# Patient Record
Sex: Male | Born: 1955
Health system: Southern US, Community
[De-identification: ages and names within clinical notes are randomized; demographics above are authoritative.]

## PROBLEM LIST (undated history)

## (undated) DIAGNOSIS — N189 Chronic kidney disease, unspecified: Secondary | ICD-10-CM

## (undated) DIAGNOSIS — L989 Disorder of the skin and subcutaneous tissue, unspecified: Secondary | ICD-10-CM

## (undated) DIAGNOSIS — IMO0001 Reserved for inherently not codable concepts without codable children: Secondary | ICD-10-CM

## (undated) DIAGNOSIS — I428 Other cardiomyopathies: Secondary | ICD-10-CM

## (undated) DIAGNOSIS — I639 Cerebral infarction, unspecified: Secondary | ICD-10-CM

## (undated) DIAGNOSIS — I509 Heart failure, unspecified: Secondary | ICD-10-CM

## (undated) DIAGNOSIS — D649 Anemia, unspecified: Secondary | ICD-10-CM

## (undated) DIAGNOSIS — Z95810 Presence of automatic (implantable) cardiac defibrillator: Secondary | ICD-10-CM

## (undated) DIAGNOSIS — Z8673 Personal history of transient ischemic attack (TIA), and cerebral infarction without residual deficits: Secondary | ICD-10-CM

## (undated) DIAGNOSIS — I739 Peripheral vascular disease, unspecified: Secondary | ICD-10-CM

## (undated) DIAGNOSIS — I679 Cerebrovascular disease, unspecified: Secondary | ICD-10-CM

## (undated) DIAGNOSIS — E785 Hyperlipidemia, unspecified: Secondary | ICD-10-CM

## (undated) DIAGNOSIS — I10 Essential (primary) hypertension: Secondary | ICD-10-CM

## (undated) DIAGNOSIS — M1990 Unspecified osteoarthritis, unspecified site: Secondary | ICD-10-CM

## (undated) DIAGNOSIS — Z720 Tobacco use: Secondary | ICD-10-CM

## (undated) HISTORY — DX: Disorder of the skin and subcutaneous tissue, unspecified: L98.9

## (undated) HISTORY — DX: Cerebrovascular disease, unspecified: I67.9

## (undated) HISTORY — DX: Essential (primary) hypertension: I10

## (undated) HISTORY — PX: LEG AMPUTATION BELOW KNEE: SHX694

## (undated) HISTORY — PX: EYE SURGERY: SHX253

## (undated) HISTORY — DX: Peripheral vascular disease, unspecified: I73.9

## (undated) HISTORY — DX: Chronic kidney disease, unspecified: N18.9

## (undated) HISTORY — DX: Hyperlipidemia, unspecified: E78.5

## (undated) HISTORY — DX: Other cardiomyopathies: I42.8

## (undated) HISTORY — PX: CARDIAC DEFIBRILLATOR PLACEMENT: SHX171

## (undated) HISTORY — DX: Personal history of transient ischemic attack (TIA), and cerebral infarction without residual deficits: Z86.73

## (undated) HISTORY — DX: Anemia, unspecified: D64.9

## (undated) HISTORY — DX: Tobacco use: Z72.0

## (undated) HISTORY — DX: Heart failure, unspecified: I50.9

## (undated) SURGERY — Surgical Case
Anesthesia: *Unknown

---

## 2002-06-24 ENCOUNTER — Inpatient Hospital Stay (HOSPITAL_COMMUNITY): Admission: EM | Admit: 2002-06-24 | Discharge: 2002-06-26 | Payer: Self-pay | Admitting: Emergency Medicine

## 2002-06-24 ENCOUNTER — Encounter: Payer: Self-pay | Admitting: Emergency Medicine

## 2002-06-25 ENCOUNTER — Encounter: Payer: Self-pay | Admitting: *Deleted

## 2002-07-16 ENCOUNTER — Ambulatory Visit (HOSPITAL_COMMUNITY): Admission: RE | Admit: 2002-07-16 | Discharge: 2002-07-16 | Payer: Self-pay | Admitting: *Deleted

## 2002-07-25 ENCOUNTER — Ambulatory Visit (HOSPITAL_COMMUNITY): Admission: RE | Admit: 2002-07-25 | Discharge: 2002-07-25 | Payer: Self-pay | Admitting: *Deleted

## 2002-07-28 ENCOUNTER — Emergency Department (HOSPITAL_COMMUNITY): Admission: EM | Admit: 2002-07-28 | Discharge: 2002-07-28 | Payer: Self-pay | Admitting: Emergency Medicine

## 2002-07-28 ENCOUNTER — Encounter: Payer: Self-pay | Admitting: Emergency Medicine

## 2002-09-03 ENCOUNTER — Encounter: Payer: Self-pay | Admitting: Family Medicine

## 2002-09-03 ENCOUNTER — Ambulatory Visit (HOSPITAL_COMMUNITY): Admission: RE | Admit: 2002-09-03 | Discharge: 2002-09-03 | Payer: Self-pay | Admitting: Family Medicine

## 2003-08-19 ENCOUNTER — Encounter: Payer: Self-pay | Admitting: Internal Medicine

## 2003-11-12 ENCOUNTER — Inpatient Hospital Stay (HOSPITAL_COMMUNITY): Admission: RE | Admit: 2003-11-12 | Discharge: 2003-11-13 | Payer: Self-pay | Admitting: *Deleted

## 2004-02-04 ENCOUNTER — Ambulatory Visit: Payer: Self-pay

## 2004-05-27 ENCOUNTER — Ambulatory Visit: Payer: Self-pay | Admitting: Internal Medicine

## 2004-06-14 ENCOUNTER — Ambulatory Visit: Payer: Self-pay | Admitting: *Deleted

## 2004-09-23 ENCOUNTER — Ambulatory Visit: Payer: Self-pay

## 2005-02-10 ENCOUNTER — Ambulatory Visit: Payer: Self-pay | Admitting: Cardiology

## 2007-02-02 ENCOUNTER — Ambulatory Visit: Payer: Self-pay | Admitting: Internal Medicine

## 2007-02-09 ENCOUNTER — Ambulatory Visit: Payer: Self-pay | Admitting: Cardiovascular Disease

## 2007-05-24 ENCOUNTER — Ambulatory Visit: Payer: Self-pay | Admitting: Cardiovascular Disease

## 2007-07-16 ENCOUNTER — Ambulatory Visit: Payer: Self-pay | Admitting: Cardiovascular Disease

## 2007-09-04 ENCOUNTER — Ambulatory Visit: Payer: Self-pay | Admitting: Internal Medicine

## 2007-09-17 ENCOUNTER — Ambulatory Visit (HOSPITAL_COMMUNITY): Admission: RE | Admit: 2007-09-17 | Discharge: 2007-09-17 | Payer: Self-pay | Admitting: Family Medicine

## 2007-10-29 ENCOUNTER — Encounter (INDEPENDENT_AMBULATORY_CARE_PROVIDER_SITE_OTHER): Payer: Self-pay

## 2007-10-29 LAB — CONVERTED CEMR LAB
ALT: 8 U/L
AST: 9 U/L
Albumin: 4.3 g/dL
Alkaline Phosphatase: 70 U/L
BUN: 34 mg/dL
Bilirubin, Direct: 0.1 mg/dL
CO2: 29 meq/L
Calcium: 9.8 mg/dL
Chloride: 100 meq/L
Cholesterol: 144 mg/dL
Creatinine, Ser: 1.25 mg/dL
Glucose, Bld: 214 mg/dL
HDL: 35 mg/dL
Hgb A1c MFr Bld: 11.5 %
LDL Cholesterol: 40 mg/dL
Potassium: 3.7 meq/L
Sodium: 145 meq/L
Total Protein: 7 g/dL
Triglycerides: 346 mg/dL

## 2007-12-05 ENCOUNTER — Ambulatory Visit (HOSPITAL_COMMUNITY): Admission: RE | Admit: 2007-12-05 | Discharge: 2007-12-05 | Payer: Self-pay | Admitting: Family Medicine

## 2007-12-20 ENCOUNTER — Ambulatory Visit: Payer: Self-pay | Admitting: Cardiology

## 2008-05-22 ENCOUNTER — Encounter: Payer: Self-pay | Admitting: Internal Medicine

## 2008-06-17 ENCOUNTER — Ambulatory Visit: Payer: Self-pay | Admitting: Vascular Surgery

## 2008-06-24 ENCOUNTER — Ambulatory Visit: Payer: Self-pay | Admitting: Surgery

## 2008-06-24 ENCOUNTER — Ambulatory Visit (HOSPITAL_COMMUNITY): Admission: RE | Admit: 2008-06-24 | Discharge: 2008-06-24 | Payer: Self-pay | Admitting: Surgery

## 2008-08-05 ENCOUNTER — Ambulatory Visit: Payer: Self-pay | Admitting: Vascular Surgery

## 2008-08-11 ENCOUNTER — Encounter: Payer: Self-pay | Admitting: Cardiology

## 2008-08-14 ENCOUNTER — Encounter: Payer: Self-pay | Admitting: Cardiovascular Disease

## 2008-08-18 ENCOUNTER — Ambulatory Visit: Payer: Self-pay | Admitting: Vascular Surgery

## 2008-08-19 ENCOUNTER — Ambulatory Visit: Payer: Self-pay | Admitting: Cardiology

## 2008-08-27 DIAGNOSIS — E785 Hyperlipidemia, unspecified: Secondary | ICD-10-CM | POA: Insufficient documentation

## 2008-08-27 DIAGNOSIS — I10 Essential (primary) hypertension: Secondary | ICD-10-CM | POA: Insufficient documentation

## 2008-08-28 ENCOUNTER — Ambulatory Visit: Payer: Self-pay | Admitting: Cardiology

## 2008-08-28 ENCOUNTER — Encounter: Payer: Self-pay | Admitting: Internal Medicine

## 2008-09-03 ENCOUNTER — Ambulatory Visit (HOSPITAL_COMMUNITY): Admission: RE | Admit: 2008-09-03 | Discharge: 2008-09-03 | Payer: Self-pay | Admitting: Vascular Surgery

## 2008-09-03 ENCOUNTER — Encounter: Payer: Self-pay | Admitting: Vascular Surgery

## 2008-09-03 ENCOUNTER — Ambulatory Visit: Payer: Self-pay | Admitting: Vascular Surgery

## 2008-09-16 ENCOUNTER — Ambulatory Visit: Payer: Self-pay | Admitting: Vascular Surgery

## 2008-09-25 ENCOUNTER — Emergency Department (HOSPITAL_COMMUNITY): Admission: EM | Admit: 2008-09-25 | Discharge: 2008-09-26 | Payer: Self-pay | Admitting: Emergency Medicine

## 2008-09-30 ENCOUNTER — Ambulatory Visit: Payer: Self-pay | Admitting: Vascular Surgery

## 2008-10-14 ENCOUNTER — Ambulatory Visit: Payer: Self-pay | Admitting: Vascular Surgery

## 2008-10-30 ENCOUNTER — Encounter: Payer: Self-pay | Admitting: Vascular Surgery

## 2008-10-30 ENCOUNTER — Ambulatory Visit: Payer: Self-pay | Admitting: Vascular Surgery

## 2008-10-30 ENCOUNTER — Inpatient Hospital Stay (HOSPITAL_COMMUNITY): Admission: RE | Admit: 2008-10-30 | Discharge: 2008-11-05 | Payer: Self-pay | Admitting: Vascular Surgery

## 2008-10-31 ENCOUNTER — Ambulatory Visit: Payer: Self-pay | Admitting: Physical Medicine & Rehabilitation

## 2008-11-03 ENCOUNTER — Ambulatory Visit: Payer: Self-pay | Admitting: Vascular Surgery

## 2008-11-05 ENCOUNTER — Inpatient Hospital Stay (HOSPITAL_COMMUNITY)
Admission: RE | Admit: 2008-11-05 | Discharge: 2008-11-14 | Payer: Self-pay | Admitting: Physical Medicine & Rehabilitation

## 2008-11-05 ENCOUNTER — Encounter: Payer: Self-pay | Admitting: Cardiology

## 2008-11-11 ENCOUNTER — Ambulatory Visit: Payer: Self-pay | Admitting: Physical Medicine & Rehabilitation

## 2008-12-02 ENCOUNTER — Ambulatory Visit: Payer: Self-pay | Admitting: Vascular Surgery

## 2008-12-05 ENCOUNTER — Encounter
Admission: RE | Admit: 2008-12-05 | Discharge: 2008-12-23 | Payer: Self-pay | Admitting: Physical Medicine & Rehabilitation

## 2008-12-10 ENCOUNTER — Ambulatory Visit: Payer: Self-pay | Admitting: Physical Medicine & Rehabilitation

## 2008-12-23 ENCOUNTER — Encounter: Payer: Self-pay | Admitting: Cardiology

## 2008-12-23 ENCOUNTER — Ambulatory Visit: Payer: Self-pay | Admitting: Vascular Surgery

## 2009-01-07 ENCOUNTER — Encounter: Payer: Self-pay | Admitting: Cardiology

## 2009-01-07 ENCOUNTER — Ambulatory Visit: Payer: Self-pay | Admitting: Cardiology

## 2009-01-07 ENCOUNTER — Encounter: Payer: Self-pay | Admitting: Adult Health

## 2009-01-07 DIAGNOSIS — I739 Peripheral vascular disease, unspecified: Secondary | ICD-10-CM | POA: Insufficient documentation

## 2009-01-08 ENCOUNTER — Encounter: Payer: Self-pay | Admitting: Cardiology

## 2009-01-12 ENCOUNTER — Ambulatory Visit: Payer: Self-pay | Admitting: Cardiology

## 2009-01-27 ENCOUNTER — Ambulatory Visit: Payer: Self-pay | Admitting: Vascular Surgery

## 2009-01-30 ENCOUNTER — Encounter (INDEPENDENT_AMBULATORY_CARE_PROVIDER_SITE_OTHER): Payer: Self-pay

## 2009-01-30 ENCOUNTER — Ambulatory Visit: Payer: Self-pay | Admitting: Cardiology

## 2009-02-03 ENCOUNTER — Ambulatory Visit: Payer: Self-pay | Admitting: Cardiology

## 2009-02-03 ENCOUNTER — Encounter: Payer: Self-pay | Admitting: Internal Medicine

## 2009-03-13 ENCOUNTER — Encounter: Payer: Self-pay | Admitting: Adult Health

## 2009-04-28 ENCOUNTER — Ambulatory Visit: Payer: Self-pay | Admitting: Vascular Surgery

## 2009-06-10 ENCOUNTER — Encounter (INDEPENDENT_AMBULATORY_CARE_PROVIDER_SITE_OTHER): Payer: Self-pay | Admitting: *Deleted

## 2009-08-10 ENCOUNTER — Encounter (INDEPENDENT_AMBULATORY_CARE_PROVIDER_SITE_OTHER): Payer: Self-pay | Admitting: *Deleted

## 2009-08-10 LAB — CONVERTED CEMR LAB
Albumin: 4 g/dL
Bilirubin, Direct: 0.1 mg/dL
CO2: 32 meq/L
Chloride: 99 meq/L
Cholesterol: 182 mg/dL
Creatinine, Ser: 1.28 mg/dL
Hemoglobin: 14 g/dL
Platelets: 194 10*3/uL
Total Protein: 6.9 g/dL

## 2009-08-18 ENCOUNTER — Ambulatory Visit: Payer: Self-pay | Admitting: Cardiology

## 2009-08-19 ENCOUNTER — Encounter: Payer: Self-pay | Admitting: Adult Health

## 2009-09-01 ENCOUNTER — Encounter (INDEPENDENT_AMBULATORY_CARE_PROVIDER_SITE_OTHER): Payer: Self-pay | Admitting: *Deleted

## 2009-09-07 ENCOUNTER — Encounter (INDEPENDENT_AMBULATORY_CARE_PROVIDER_SITE_OTHER): Payer: Self-pay | Admitting: *Deleted

## 2009-09-07 DIAGNOSIS — G459 Transient cerebral ischemic attack, unspecified: Secondary | ICD-10-CM | POA: Insufficient documentation

## 2009-09-09 ENCOUNTER — Encounter: Payer: Self-pay | Admitting: Orthopedic Surgery

## 2009-09-14 ENCOUNTER — Ambulatory Visit: Payer: Self-pay | Admitting: Orthopedic Surgery

## 2009-09-14 DIAGNOSIS — M722 Plantar fascial fibromatosis: Secondary | ICD-10-CM | POA: Insufficient documentation

## 2009-09-14 DIAGNOSIS — M7660 Achilles tendinitis, unspecified leg: Secondary | ICD-10-CM | POA: Insufficient documentation

## 2009-09-22 ENCOUNTER — Ambulatory Visit: Payer: Self-pay | Admitting: Internal Medicine

## 2009-09-22 ENCOUNTER — Encounter (INDEPENDENT_AMBULATORY_CARE_PROVIDER_SITE_OTHER): Payer: Self-pay | Admitting: Pediatrics

## 2009-10-07 ENCOUNTER — Encounter (INDEPENDENT_AMBULATORY_CARE_PROVIDER_SITE_OTHER): Payer: Self-pay | Admitting: *Deleted

## 2009-10-07 ENCOUNTER — Encounter: Payer: Self-pay | Admitting: Internal Medicine

## 2009-10-12 ENCOUNTER — Encounter: Payer: Self-pay | Admitting: Orthopedic Surgery

## 2009-10-16 ENCOUNTER — Encounter: Payer: Self-pay | Admitting: Internal Medicine

## 2009-10-16 ENCOUNTER — Ambulatory Visit (HOSPITAL_COMMUNITY): Admission: RE | Admit: 2009-10-16 | Discharge: 2009-10-16 | Payer: Self-pay | Admitting: Internal Medicine

## 2009-10-21 LAB — CONVERTED CEMR LAB
Basophils Relative: 0 % (ref 0–1)
CO2: 24 meq/L (ref 19–32)
Calcium: 9.6 mg/dL (ref 8.4–10.5)
Chloride: 102 meq/L (ref 96–112)
Creatinine, Ser: 2.05 mg/dL — ABNORMAL HIGH (ref 0.40–1.50)
Eosinophils Relative: 2 % (ref 0–5)
Glucose, Bld: 202 mg/dL — ABNORMAL HIGH (ref 70–99)
HCT: 39.2 % (ref 39.0–52.0)
Hemoglobin: 12.4 g/dL — ABNORMAL LOW (ref 13.0–17.0)
INR: 1.02 (ref <1.50)
Lymphs Abs: 1.5 10*3/uL (ref 0.7–4.0)
MCV: 90.7 fL (ref 78.0–100.0)
Neutro Abs: 5.3 10*3/uL (ref 1.7–7.7)
Potassium: 4.4 meq/L (ref 3.5–5.3)
Prothrombin Time: 13.3 s (ref 11.6–15.2)
WBC: 7.4 10*3/uL (ref 4.0–10.5)
aPTT: 25 s (ref 24–37)

## 2009-10-23 ENCOUNTER — Ambulatory Visit (HOSPITAL_COMMUNITY): Admission: RE | Admit: 2009-10-23 | Discharge: 2009-10-23 | Payer: Self-pay | Admitting: Cardiology

## 2009-10-23 ENCOUNTER — Ambulatory Visit: Payer: Self-pay | Admitting: Internal Medicine

## 2009-10-27 ENCOUNTER — Encounter: Payer: Self-pay | Admitting: Internal Medicine

## 2009-11-04 ENCOUNTER — Encounter (INDEPENDENT_AMBULATORY_CARE_PROVIDER_SITE_OTHER): Payer: Self-pay | Admitting: *Deleted

## 2009-11-04 ENCOUNTER — Ambulatory Visit: Payer: Self-pay | Admitting: Cardiology

## 2009-11-04 ENCOUNTER — Encounter: Payer: Self-pay | Admitting: Internal Medicine

## 2010-01-06 ENCOUNTER — Ambulatory Visit: Payer: Self-pay | Admitting: Internal Medicine

## 2010-01-07 ENCOUNTER — Encounter: Payer: Self-pay | Admitting: Internal Medicine

## 2010-04-29 ENCOUNTER — Encounter (INDEPENDENT_AMBULATORY_CARE_PROVIDER_SITE_OTHER): Payer: Self-pay | Admitting: *Deleted

## 2010-05-04 NOTE — Miscellaneous (Signed)
Summary: PT Plan of care  PT Plan of care   Imported By: Ruffin Pyo 10/16/2009 09:56:52  _____________________________________________________________________  External Attachment:    Type:   Image     Comment:   External Document

## 2010-05-04 NOTE — Miscellaneous (Signed)
Summary: LABS LIPID,A1C,CBCD,BMP,LIVER,BNP,08/10/2009  Clinical Lists Changes  Observations: Added new observation of CALCIUM: 9.7 mg/dL (08/10/2009 10:34) Added new observation of ALBUMIN: 4.0 g/dL (08/10/2009 10:34) Added new observation of PROTEIN, TOT: 6.9 g/dL (08/10/2009 10:34) Added new observation of SGPT (ALT): 15 units/L (08/10/2009 10:34) Added new observation of SGOT (AST): 17 units/L (08/10/2009 10:34) Added new observation of ALK PHOS: 69 units/L (08/10/2009 10:34) Added new observation of BILI DIRECT: 0.1 mg/dL (08/10/2009 10:34) Added new observation of CREATININE: 1.28 mg/dL (08/10/2009 10:34) Added new observation of BUN: 36 mg/dL (08/10/2009 10:34) Added new observation of BG RANDOM: 251 mg/dL (08/10/2009 10:34) Added new observation of CO2 PLSM/SER: 32 meq/L (08/10/2009 10:34) Added new observation of CL SERUM: 99 meq/L (08/10/2009 10:34) Added new observation of K SERUM: 3.6 meq/L (08/10/2009 10:34) Added new observation of NA: 140 meq/L (08/10/2009 10:34) Added new observation of HDL: 32 mg/dL (08/10/2009 10:34) Added new observation of TRIGLYC TOT: 419 mg/dL (08/10/2009 10:34) Added new observation of CHOLESTEROL: 182 mg/dL (08/10/2009 10:34) Added new observation of PLATELETK/UL: 194 K/uL (08/10/2009 10:34) Added new observation of MCV: 87.3 fL (08/10/2009 10:34) Added new observation of HCT: 40.3 % (08/10/2009 10:34) Added new observation of HGB: 14.0 g/dL (08/10/2009 10:34) Added new observation of WBC COUNT: 7.0 10*3/microliter (08/10/2009 10:34) Added new observation of BNP: <30.0  (08/10/2009 10:34) Added new observation of HGBA1C: 12.6 % (08/10/2009 10:34)

## 2010-05-04 NOTE — Letter (Signed)
Summary: [facility], general practice at [place]. [address], [place] [address]   Phone: [phone]  Fax: [phone]      November 04, 2009 MRN: [id number]   EHRIN ARRIZON [address] [place], [place]  [address]   Dear Mr. Nocera,  Your test ordered by Rande Lawman has been reviewed by your physician (or physician assistant) and was found to be normal or stable. Your physician (or physician assistant) felt no changes were needed at this time.  ____ Echocardiogram  ____ Cardiac Stress Test  _x___ Lab Work  ____ Peripheral vascular study of arms, legs or neck  __x__ CT scan or X-ray  ____ Lung or Breathing test  ____ Other:  No change in medical treatment at this time, per Dr. Lovena Le.  Enclosed is a copy of your labwork for your records.  Thank you, Tammy Baird Cancer RN    Jacqulyn Ducking, MD, Leana Gamer.C.Renella Cunas, MD, F.A.C.C Cristopher Peru, MD, F.A.C.C Rozann Lesches, MD, F.A.C.C Jenkins Rouge, MD, Leana Gamer.C.C

## 2010-05-04 NOTE — Assessment & Plan Note (Signed)
Summary: LEFT ANKLE RADIATES UP LEG/NEEDS XR/BCBS/BSF   Vital Signs:  Patient profile:   55 year old male Height:      73 inches Weight:      247 pounds Pulse rate:   76 / minute Resp:     16 per minute  Vitals Entered By: Arther Abbott MD (September 14, 2009 11:03 AM)  Visit Type:  new patient Referring Provider:  Dr. Marjean Donna Primary Provider:  Dr.Amgus Mcinnis  CC:  left ankle pain.  History of Present Illness: I saw [name] in the office today for an initial visit.  He is a 55 years old man with the complaint of:  left ankle pain.  Xrays today in our office.  Meds: Apidra, Levemir Flexpen, Clonidine, Metformin, Lasix, Lyrica, Plavix, Amlodipine, Nature made vitamins, ASA, Klor Con, Diovan, Crestor, Carvedilol.  The patient complains of pain in his LEFT leg and ankle primarily on the plantar aspect of his heel and in his Achilles.  He status post RIGHT below the knee amputation and when he went back to work in February he started having pain in his calf Achilles tendon and LEFT heel.  He has sharp dull throbbing stabbing burning pain which has an intensity of 10.  It is intermittent.  It is better if he doesn't walk but it is worse if he stands or walks on it.  Fortunately he is able to work sitting.  He does have some numbness and tingling and some occasional swelling.  Allergies: 1)  * Ivp Dye  Past History:  Past Medical History: Nonischemic cardiomyopathy; h/o CHF; EF of 15%; normal coronary angiography in 2004; AICD-2005 HYPERLIPIDEMIA (ICD-272.4) HYPERTENSION (ICD-401.9) Diabetes Tobacco abuse-20 pack years; quit Cerebrovascular disease-history of TIA; normal carotid ultrasound in 2004 Peripheral vascular disease: right below-knee amputation rare skin disease  Family History: Father:And died in his 42s; history of CVA and diabetes Mother:Diabetes; CHF Siblings-4 of 6 brothers deceased due to diabetes; FH of Cancer:  Family History of Diabetes Family  History Coronary Heart Disease male < 15  Social History: Administrator, Biochemist, clinical Divorced  Tobacco Use - 20 pack years; quit Alcohol Use - occasional Regular Exercise - no Drug Use - no occasional caffeine use  Review of Systems Constitutional:  Complains of fatigue; denies weight loss, weight gain, fever, and chills. Cardiovascular:  Denies chest pain, palpitations, fainting, and murmurs. Respiratory:  Complains of short of breath, wheezing, couch, tightness, pain on inspiration, and snoring; denies snoring . Gastrointestinal:  Complains of heartburn; denies nausea, vomiting, diarrhea, constipation, and blood in your stools. Genitourinary:  Complains of frequency and urgency; denies difficulty urinating, painful urination, flank pain, and bleeding in urine. Neurologic:  Complains of numbness, tingling, and unsteady gait; denies dizziness, tremors, and seizure. Musculoskeletal:  Complains of joint pain, swelling, instability, and muscle pain; denies stiffness, redness, and heat. Endocrine:  Complains of excessive thirst, exessive urination, and heat or cold intolerance. Psychiatric:  Denies nervousness, depression, anxiety, and hallucinations. Skin:  Denies changes in the skin, poor healing, rash, itching, and redness; rare skin disease. HEENT:  Complains of blurred or double vision and watering; denies eye pain and redness. Immunology:  Denies seasonal allergies, sinus problems, and allergic to bee stings. Hemoatologic:  Denies easy bleeding and brusing.  Physical Exam  Additional Exam:  The patient is well developed and nourished, with normal grooming and hygiene. The body habitus is normal  The pulses and perfusion were normal with normal color, temperature  and no swelling  LEFT leg  The skin was abnormal on the LEFT he says he has a skin disease and has multiple discolorations all over his skin  Neurologically he is normal on the LEFT  Lymph nodes are  normal on the left  Inspection reveals that he has a plantigrade foot and it flattened arch with decreased range of motion limited to 10 of dorsiflexion 10 of plantar flexion.  Motor exam is otherwise normal and ankle is stable.  There is tenderness in the Achilles tendon and somewhat less so in the plantar aspect of the heel     Impression & Recommendations:  Problem # 1:  ACHILLES BURSITIS OR TENDINITIS (ICD-726.71) Assessment New  Orders: Physical Therapy Referral (PT)  Problem # 2:  PLANTAR FACIITIS (ICD-728.71) Assessment: New  I want to put him in a cam walker but he can't walk with that and a below the knee prosthesis so I've ordered a foot orthotic and physical therapy  Orders: New Patient Level III [id number]) Foot x-ray complete, minimum 3 views [id number]) definite osteopenia no fracture plantigrade foot  Patient Instructions: 1)  PT for achilles tendonitis 2)  and plantar fasciitis 3)  Please schedule a follow-up appointment as needed.

## 2010-05-04 NOTE — Assessment & Plan Note (Signed)
Summary: F3M  Medications Added FUROSEMIDE 20 MG TABS (FUROSEMIDE) one by mouth three times a day KLOR-CON M20 20 MEQ CR-TABS (POTASSIUM CHLORIDE CRYS CR) one by mouth two times a day        Visit Type:  Follow-up Referring Provider:  Dr. Marjean Donna Primary Provider:  Dr.Amgus Mcinnis  CC:  no cardiology complaints.  History of Present Illness: [name] returns today for followup.  He is a very pleasant middle-age male with a nonischemic cardiomyopathy, Class II congestive heart failure, has a history of diabetes and hypertension and morbid obesity.  He is status post insertion of a Guidant Vitality single-chamber defibrillator.  He returns today for followup.  He had no chest pain.  He has chronic peripheral edema and Class II heart failure symptoms.  He has had elevated blood pressures when he forgets to take his medication.  No other symptoms.   Current Medications (verified): 1)  Carvedilol 25 Mg Tabs (Carvedilol) .... Take One Tablet By Mouth Twice A Day 2)  Aspirin 81 Mg Tbec (Aspirin) .... One By Mouth Daily 3)  Furosemide 20 Mg Tabs (Furosemide) .... One By Mouth Three Times A Day 4)  Klor-Con M20 20 Meq Cr-Tabs (Potassium Chloride Crys Cr) .... One By Mouth Two Times A Day 5)  Metformin Hcl 500 Mg Tabs (Metformin Hcl) .... One By Mouth Two Times A Day 6)  B-12 1000 Mcg Cr-Tabs (Cyanocobalamin) .... One By Mouth Daily 7)  Apidra 100 Unit/ml Soln (Insulin Glulisine) .... Sliding Scale 8)  Levemir Flexpen 100 Unit/ml Soln (Insulin Detemir) .... Sub-Q Two Times A Day 9)  Plavix 75 Mg Tabs (Clopidogrel Bisulfate) .... Take 1 Tab Daily 10)  Clonidine Hcl 0.1 Mg Tabs (Clonidine Hcl) .... Take 1 Tablet Once Daily Two Times A Day 11)  Crestor 20 Mg Tabs (Rosuvastatin Calcium) .... Take 2 Tabs Daily 12)  Amlodipine Besylate 10 Mg Tabs (Amlodipine Besylate) .... Take 1 Tab Daily 13)  Lyrica 100 Mg Caps (Pregabalin) .... Take 1 Tab Two Times A Day 14)  Trilipix 135 Mg Cpdr (Choline  Fenofibrate) .... One By Mouth Daily 15)  Lovaza 1 Gm Caps (Omega-3-Acid Ethyl Esters) .... One By Mouth Two Times A Day  Allergies: 1)  * Ivp Dye  Comments:  Nurse/Medical Assistant: patient was given med list and reviewed also went over previous med list and ov stated all meds was correct  Past History:  Past Medical History: Last updated: 09/14/2009 Nonischemic cardiomyopathy; h/o CHF; EF of 15%; normal coronary angiography in 2004; AICD-2005 HYPERLIPIDEMIA (ICD-272.4) HYPERTENSION (ICD-401.9) Diabetes Tobacco abuse-20 pack years; quit Cerebrovascular disease-history of TIA; normal carotid ultrasound in 2004 Peripheral vascular disease: right below-knee amputation rare skin disease  Past Surgical History: Last updated: 09/07/2009 AICD (Guidant) implantation in 2005 Right below-knee amputation  Review of Systems  The patient denies chest pain, syncope, dyspnea on exertion, and peripheral edema.    Vital Signs:  Patient profile:   55 year old male Weight:      247 pounds BMI:     32.71 Pulse rate:   87 / minute BP sitting:   213 / 103  (right arm)  Vitals Entered By: [name] Sou, CNA (January 06, 2010 2:10 PM)  Physical Exam  General:  Well developed, well nourished, in no acute distress. Head:  normocephalic and atraumatic Eyes:  PERRLA/EOM intact; conjunctiva and lids normal. Mouth:  Teeth, gums and palate normal. Oral mucosa normal. Neck:  Neck supple, no JVD. No masses, thyromegaly or  abnormal cervical nodes. Chest Wall:  Well healed ICD incision. Lungs:  Clear bilaterally to auscultation with no wheezes or rhonchi. Heart:  Distant with no MRG. PMI is enlarged and laterally displaced. Abdomen:  Bowel sounds positive; abdomen soft and non-tender without masses, organomegaly, or hernias noted. No hepatosplenomegaly. Msk:  R BKA, with prosthesis. Pulses:  pulses normal in all 4 extremities Extremities:  No clubbing or cyanosis. Neurologic:  Alert and  oriented x 3.    ICD Specifications Following MD:  [name] Peru, MD     ICD Vendor:  [employer]     ICD Model Number:  [id number]     ICD Serial Number:  [id number] ICD DOI:  10/23/2009     ICD Implanting MD:  [name] Peru, MD  Lead 1:    Location: [facility]     DOI: 11/12/2003     Model #: [id number]     Serial #: [id number]     Status: active  Indications::  NICM  Explantation Comments: 10/23/09 [employer] [id number] explanted  ICD Follow Up Remote Check?  No Battery Voltage:  good V     Charge Time:  8.2 seconds     Battery Est. Longevity:  9 years Underlying rhythm:  SR ICD Dependent:  No       ICD Device Measurements Right Ventricle:  Amplitude: 25 mV, Impedance: 545 ohms, Threshold: 0.7 V at 0.4 msec Shock Impedance: 43 ohms   Episodes Coumadin:  No Shock:  0     ATP:  0     Nonsustained:  0     Ventricular Pacing:  <1%  Brady Parameters Mode VVI     Lower Rate Limit:  40      Tachy Zones VF:  210     VT:  175     Next Cardiology Appt Due:  04/04/2010 Tech Comments:  No parameter changes.  Device function normal.  No Latitude @ this time.  ROV 3 months [facility] clinic. Alma Friendly, LPN  October  5, 624THL 2:25 PM n MD Comments:  Agree with above.  Impression & Recommendations:  Problem # 1:  AICD (ICD-V45.02) His device is working normally.  Will recheck in several months.  Problem # 2:  CARDIOMYOPATHY-NONISCHEMIC (ICD-425.4) His CHF remains class 2.  He will continue his current meds and maintain a low sodium diet. His updated medication list for this problem includes:    Carvedilol 25 Mg Tabs (Carvedilol) .Marland Kitchen... Take one tablet by mouth twice a day    Aspirin 81 Mg Tbec (Aspirin) ..... One by mouth daily    Furosemide 20 Mg Tabs (Furosemide) ..... One by mouth three times a day    Plavix 75 Mg Tabs (Clopidogrel bisulfate) .Marland Kitchen... Take 1 tab daily    Amlodipine Besylate 10 Mg Tabs (Amlodipine besylate) .Marland Kitchen... Take 1 tab daily  Problem # 3:  HYPERTENSION (ICD-401.9) His blood  pressure is elevated today as he did not take his morning coreg.  I have asked him not to miss his meds.  He may require uptitration.  His updated medication list for this problem includes:    Carvedilol 25 Mg Tabs (Carvedilol) .Marland Kitchen... Take one tablet by mouth twice a day    Aspirin 81 Mg Tbec (Aspirin) ..... One by mouth daily    Furosemide 20 Mg Tabs (Furosemide) ..... One by mouth three times a day    Clonidine Hcl 0.1 Mg Tabs (Clonidine hcl) .Marland Kitchen... Take 1 tablet once daily two times a day  Amlodipine Besylate 10 Mg Tabs (Amlodipine besylate) .Marland Kitchen... Take 1 tab daily  Patient Instructions: 1)  Your physician recommends that you schedule a follow-up appointment in: 1 year 2)  Your physician recommends that you continue on your current medications as directed. Please refer to the Current Medication list given to you today.

## 2010-05-04 NOTE — Assessment & Plan Note (Signed)
Summary: ROV  Medications Added FUROSEMIDE 20 MG TABS (FUROSEMIDE) take 3 tabs two times a day METFORMIN HCL 500 MG TABS (METFORMIN HCL) one by mouth two times a day CRESTOR 20 MG TABS (ROSUVASTATIN CALCIUM) take 2 tabs daily AMLODIPINE BESYLATE 10 MG TABS (AMLODIPINE BESYLATE) take 1 tab daily DIOVAN HCT 320-25 MG TABS (VALSARTAN-HYDROCHLOROTHIAZIDE) take 1 tab daily LYRICA 100 MG CAPS (PREGABALIN) take 1 tab two times a day      Allergies Added:   Visit Type:  Follow-up Primary Provider:  Dr.Amgus Mcinnis  CC:  no cardiology complaints today.  History of Present Illness: [name] is a very pleasant 55 y/o AAM with know history of nonischemic CM with EF of 15%, difficult to control hyptertension, and DM with recent BKA of R leg secondary to PVD.  He is now wearing a new prosthesis.  He has seen Dr. Everette Rank in the interim and medications have been adjusted and removed.  He is in the process of lab work evaluation and is supposed to see Dr. Everette Rank in 5 days for follow-up.  On last visit I added clonidine 0.1 mg two times a day to his existing regimine with good BP control.  He is without complaint.  He has not taken his medications today.  Current Medications (verified): 1)  Carvedilol 25 Mg Tabs (Carvedilol) .... Take One Tablet By Mouth Twice A Day 2)  Aspirin 81 Mg Tbec (Aspirin) .... One By Mouth Daily 3)  Furosemide 20 Mg Tabs (Furosemide) .... Take 3 Tabs Two Times A Day 4)  Klor-Con M20 20 Meq Cr-Tabs (Potassium Chloride Crys Cr) .... One By Mouth Daily 5)  Metformin Hcl 500 Mg Tabs (Metformin Hcl) .... One By Mouth Two Times A Day 6)  B-12 1000 Mcg Cr-Tabs (Cyanocobalamin) .... One By Mouth Daily 7)  Apidra 100 Unit/ml Soln (Insulin Glulisine) .... Sliding Scale 8)  Levemir Flexpen 100 Unit/ml Soln (Insulin Detemir) .... Sub-Q Two Times A Day 9)  Plavix 75 Mg Tabs (Clopidogrel Bisulfate) .... Take 1 Tab Daily 10)  Clonidine Hcl 0.1 Mg Tabs (Clonidine Hcl) .... Take 1 Tablet  Once Daily Two Times A Day 11)  Crestor 20 Mg Tabs (Rosuvastatin Calcium) .... Take 2 Tabs Daily 12)  Amlodipine Besylate 10 Mg Tabs (Amlodipine Besylate) .... Take 1 Tab Daily 13)  Diovan Hct 320-25 Mg Tabs (Valsartan-Hydrochlorothiazide) .... Take 1 Tab Daily 14)  Lyrica 100 Mg Caps (Pregabalin) .... Take 1 Tab Two Times A Day  Allergies (verified): 1)  * Ivp Dye  Vital Signs:  Patient profile:   55 year old male Weight:      249 pounds Pulse rate:   81 / minute BP sitting:   188 / 91  (right arm)  Vitals Entered By: Doretha Sou, CNA (Aug 18, 2009 3:02 PM)  Physical Exam  General:  Well developed, well nourished, in no acute distress. Lungs:  Clear bilaterally to auscultation and percussion. Heart:  Distant with no MRG. Abdomen:  Bowel sounds positive; abdomen soft and non-tender without masses, organomegaly, or hernias noted. No hepatosplenomegaly. Msk:  R BKA, with prosthesis. Extremities:  See above.  No edema in the L leg. Neurologic:  Alert and oriented x 3. Psych:  Normal affect.    ICD Specifications Following MD:  Cristopher Peru, MD     ICD Vendor:  [employer] Number:  [id number]     ICD Serial Number:  [id number] ICD DOI:  11/12/2003  ICD Implanting MD:  Cristopher Peru, MD  Lead 1:    Location: [facility]     DOI: 11/12/2003     Model #: [id number]     Serial #: [id number]     Status: active  Indications::  NICM   ICD Follow Up ICD Dependent:  No      Episodes Coumadin:  No  Brady Parameters Mode VVI     Lower Rate Limit:  40      Tachy Zones VF:  210     VT:  175     Impression & Recommendations:  Problem # 1:  CARDIOMYOPATHY (ICD-425.4) We will need to find balance concerning his diuretic and his BP control.  Changes are noted per Dr. Everette Rank. Review of labs shows that values are WNL.  Exoforge was changed because it needed to be prequalifed.  BP should be much lower in the setting of nonischmic CM with EF of 15%.  Would keep patient on clonidine or  hydralazine to maintain BP > [id number] systolic if he tolerates this.  Frequent changes in his medications should be avoided. The following medications were removed from the medication list:    Metolazone 5 Mg Tabs (Metolazone) .Marland Kitchen... Take one tablet by mouth daily.    Exforge 10-320 Mg Tabs (Amlodipine besylate-valsartan) .Marland Kitchen... Take 1 tablet by mouth at bedtime His updated medication list for this problem includes:    Carvedilol 25 Mg Tabs (Carvedilol) .Marland Kitchen... Take one tablet by mouth twice a day    Aspirin 81 Mg Tbec (Aspirin) ..... One by mouth daily    Furosemide 20 Mg Tabs (Furosemide) .Marland Kitchen... Take 3 tabs two times a day    Plavix 75 Mg Tabs (Clopidogrel bisulfate) .Marland Kitchen... Take 1 tab daily    Amlodipine Besylate 10 Mg Tabs (Amlodipine besylate) .Marland Kitchen... Take 1 tab daily    Diovan Hct 320-25 Mg Tabs (Valsartan-hydrochlorothiazide) .Marland Kitchen... Take 1 tab daily  Problem # 2:  HYPERTENSION (ICD-401.9) BP not optimally controlled for diagnosis of nonischemic CM.  Could add Spironolactone 25mg  daily.  As Dr. Everette Rank is adjlusting medications, it is important to have consistancy in BP control.  Will discuss this with Dr. Everette Rank. The following medications were removed from the medication list:    Metolazone 5 Mg Tabs (Metolazone) .Marland Kitchen... Take one tablet by mouth daily.    Exforge 10-320 Mg Tabs (Amlodipine besylate-valsartan) .Marland Kitchen... Take 1 tablet by mouth at bedtime His updated medication list for this problem includes:    Carvedilol 25 Mg Tabs (Carvedilol) .Marland Kitchen... Take one tablet by mouth twice a day    Aspirin 81 Mg Tbec (Aspirin) ..... One by mouth daily    Furosemide 20 Mg Tabs (Furosemide) .Marland Kitchen... Take 3 tabs two times a day    Clonidine Hcl 0.1 Mg Tabs (Clonidine hcl) .Marland Kitchen... Take 1 tablet once daily two times a day    Amlodipine Besylate 10 Mg Tabs (Amlodipine besylate) .Marland Kitchen... Take 1 tab daily    Diovan Hct 320-25 Mg Tabs (Valsartan-hydrochlorothiazide) .Marland Kitchen... Take 1 tab daily  Patient Instructions: 1)  Your physician  recommends that you schedule a follow-up appointment in: 1 month 2)  Your physician recommends that you continue on your current medications as directed. Please refer to the Current Medication list given to you today.

## 2010-05-04 NOTE — Procedures (Signed)
Summary: [facility]  Medications Added FUROSEMIDE 20 MG TABS (FUROSEMIDE) one by mouth two times a day TRILIPIX 135 MG CPDR (CHOLINE FENOFIBRATE) one by mouth daily LOVAZA 1 GM CAPS (OMEGA-3-ACID ETHYL ESTERS) one by mouth two times a day      Allergies Added:   Current Medications (verified): 1)  Carvedilol 25 Mg Tabs (Carvedilol) .... Take One Tablet By Mouth Twice A Day 2)  Aspirin 81 Mg Tbec (Aspirin) .... One By Mouth Daily 3)  Furosemide 20 Mg Tabs (Furosemide) .... One By Mouth Two Times A Day 4)  Klor-Con M20 20 Meq Cr-Tabs (Potassium Chloride Crys Cr) .... One By Mouth Daily 5)  Metformin Hcl 500 Mg Tabs (Metformin Hcl) .... One By Mouth Two Times A Day 6)  B-12 1000 Mcg Cr-Tabs (Cyanocobalamin) .... One By Mouth Daily 7)  Apidra 100 Unit/ml Soln (Insulin Glulisine) .... Sliding Scale 8)  Levemir Flexpen 100 Unit/ml Soln (Insulin Detemir) .... Sub-Q Two Times A Day 9)  Plavix 75 Mg Tabs (Clopidogrel Bisulfate) .... Take 1 Tab Daily 10)  Clonidine Hcl 0.1 Mg Tabs (Clonidine Hcl) .... Take 1 Tablet Once Daily Two Times A Day 11)  Crestor 20 Mg Tabs (Rosuvastatin Calcium) .... Take 2 Tabs Daily 12)  Amlodipine Besylate 10 Mg Tabs (Amlodipine Besylate) .... Take 1 Tab Daily 13)  Lyrica 100 Mg Caps (Pregabalin) .... Take 1 Tab Two Times A Day 14)  Trilipix 135 Mg Cpdr (Choline Fenofibrate) .... One By Mouth Daily 15)  Lovaza 1 Gm Caps (Omega-3-Acid Ethyl Esters) .... One By Mouth Two Times A Day  Allergies (verified): 1)  * Ivp Dye   ICD Specifications Following MD:  Cristopher Peru, MD     ICD Vendor:  [place]     ICD Model Number:  [id number]     ICD Serial Number:  [id number] ICD DOI:  10/23/2009     ICD Implanting MD:  Cristopher Peru, MD  Lead 1:    Location: RV     DOI: 11/12/2003     Model #: [id number]     Serial #: [id number]     Status: active  Indications::  NICM  Explantation Comments: 10/23/09 [employer] [id number] explanted  ICD Follow Up Remote Check?  No Battery  Voltage:  good V     Charge Time:  8.2 seconds     Battery Est. Longevity:  BOL Underlying rhythm:  SR ICD Dependent:  No       ICD Device Measurements Right Ventricle:  Amplitude: 25 mV, Impedance: 526 ohms, Threshold: 0.5 V at 0.4 msec Shock Impedance: 42 ohms   Episodes Coumadin:  No Shock:  0     ATP:  0     Nonsustained:  0     Ventricular Pacing:  4%  Brady Parameters Mode VVI     Lower Rate Limit:  40      Tachy Zones VF:  210     VT:  175     Next Cardiology Appt Due:  02/02/2010 Tech Comments:  Steri strips removed, no redness or edema noted.  No parameter changes.  Device function normal.  ROV 3 months with Dr. Lovena Le in [facility]. Alma Friendly, LPN  August  3, 624THL 4:09 PM

## 2010-05-04 NOTE — Assessment & Plan Note (Signed)
Summary: F3M      Allergies Added:   Visit Type:  Follow-up Referring Provider:  Dr. Marjean Donna Primary Provider:  Dr.Amgus Mcinnis  CC:  some sob.  History of Present Illness: [name] returns today for followup.  He is a very pleasant middle-age male with a nonischemic cardiomyopathy, Class II congestive heart failure, has a history of diabetes and hypertension and morbid obesity.  He is status post insertion of a Guidant Vitality single-chamber defibrillator.  He returns today for followup.  He had no chest pain.  He has chronic peripheral edema and Class II heart failure symptoms.  He has reached ICD ERI last month.   Current Medications (verified): 1)  Carvedilol 25 Mg Tabs (Carvedilol) .... Take One Tablet By Mouth Twice A Day 2)  Aspirin 81 Mg Tbec (Aspirin) .... One By Mouth Daily 3)  Furosemide 20 Mg Tabs (Furosemide) .... Take 3 Tabs Two Times A Day 4)  Klor-Con M20 20 Meq Cr-Tabs (Potassium Chloride Crys Cr) .... One By Mouth Daily 5)  Metformin Hcl 500 Mg Tabs (Metformin Hcl) .... One By Mouth Two Times A Day 6)  B-12 1000 Mcg Cr-Tabs (Cyanocobalamin) .... One By Mouth Daily 7)  Apidra 100 Unit/ml Soln (Insulin Glulisine) .... Sliding Scale 8)  Levemir Flexpen 100 Unit/ml Soln (Insulin Detemir) .... Sub-Q Two Times A Day 9)  Plavix 75 Mg Tabs (Clopidogrel Bisulfate) .... Take 1 Tab Daily 10)  Clonidine Hcl 0.1 Mg Tabs (Clonidine Hcl) .... Take 1 Tablet Once Daily Two Times A Day 11)  Crestor 20 Mg Tabs (Rosuvastatin Calcium) .... Take 2 Tabs Daily 12)  Amlodipine Besylate 10 Mg Tabs (Amlodipine Besylate) .... Take 1 Tab Daily 13)  Diovan Hct 320-25 Mg Tabs (Valsartan-Hydrochlorothiazide) .... Take 1 Tab Daily 14)  Lyrica 100 Mg Caps (Pregabalin) .... Take 1 Tab Two Times A Day  Allergies (verified): 1)  * Ivp Dye  Past History:  Past Medical History: Last updated: 09/14/2009 Nonischemic cardiomyopathy; h/o CHF; EF of 15%; normal coronary angiography in 2004;  AICD-2005 HYPERLIPIDEMIA (ICD-272.4) HYPERTENSION (ICD-401.9) Diabetes Tobacco abuse-20 pack years; quit Cerebrovascular disease-history of TIA; normal carotid ultrasound in 2004 Peripheral vascular disease: right below-knee amputation rare skin disease  Past Surgical History: Last updated: 09/07/2009 AICD (Guidant) implantation in 2005 Right below-knee amputation  Review of Systems  The patient denies chest pain, syncope, dyspnea on exertion, and peripheral edema.    Vital Signs:  Patient profile:   55 year old male Weight:      247 pounds Pulse rate:   81 / minute BP sitting:   148 / 90  (right arm)  Vitals Entered By: Doretha Sou, CNA (September 22, 2009 9:06 AM)  Physical Exam  General:  Well developed, well nourished, in no acute distress. Head:  normocephalic and atraumatic Eyes:  PERRLA/EOM intact; conjunctiva and lids normal. Mouth:  Teeth, gums and palate normal. Oral mucosa normal. Neck:  Neck supple, no JVD. No masses, thyromegaly or abnormal cervical nodes. Chest Wall:  Well healed ICD incision. Lungs:  Clear bilaterally to auscultation with no wheezes or rhonchi. Heart:  Distant with no MRG. PMI is enlarged and laterally displaced. Abdomen:  Bowel sounds positive; abdomen soft and non-tender without masses, organomegaly, or hernias noted. No hepatosplenomegaly. Msk:  R BKA, with prosthesis. Extremities:  See above.  No edema in the L leg. Neurologic:  Alert and oriented x 3.   EKG  Procedure date:  08/14/2009  Findings:      Sinus bradycardia  with rate of:  57.   ICD Specifications Following MD:  Cristopher Peru, MD     ICD Vendor:  [employer] Number:  [id number]     ICD Serial Number:  [id number] ICD DOI:  11/12/2003     ICD Implanting MD:  Cristopher Peru, MD  Lead 1:    Location: RV     DOI: 11/12/2003     Model #: [id number]     Serial #: [id number]     Status: active  Indications::  NICM   ICD Follow Up Remote Check?  No Battery Voltage:  2.57 V      Charge Time:  27.1 seconds     Battery Est. Longevity:  ERI Underlying rhythm:  SR ICD Dependent:  No       ICD Device Measurements Right Ventricle:  Amplitude: 18.8 mV, Impedance: 557 ohms, Threshold: 0.6 V at 0.5 msec Shock Impedance: 42 ohms   Episodes Coumadin:  No Shock:  0     ATP:  0     Nonsustained:  0      Brady Parameters Mode VVI     Lower Rate Limit:  40      Tachy Zones VF:  210     VT:  175     Tech Comments:  Battery @ ERI 08/18/09.   [name] Friendly, LPN  June 21, 624THL QA348G AM  MD Comments:  Agree with above.  Impression & Recommendations:  Problem # 1:  AICD (ICD-V45.02) HIs device is working normally but has reached ERI.  I  have discussed the risks/benefits/goals/expectations of ICD generator change and he wishes to proceed.  He will call us to schedule in the next few weeks after talking to his brother to arrange transportation.  Problem # 2:  CARDIOMYOPATHY-NONISCHEMIC (ICD-425.4)  His CHF symptoms remain class 2.  He is also limited by peripheral vascular disease s/p right BKA. His updated medication list for this problem includes:    Carvedilol 25 Mg Tabs (Carvedilol) .Marland Kitchen... Take one tablet by mouth twice a day    Aspirin 81 Mg Tbec (Aspirin) ..... One by mouth daily    Furosemide 20 Mg Tabs (Furosemide) .Marland Kitchen... Take 3 tabs two times a day    Plavix 75 Mg Tabs (Clopidogrel bisulfate) .Marland Kitchen... Take 1 tab daily    Amlodipine Besylate 10 Mg Tabs (Amlodipine besylate) .Marland Kitchen... Take 1 tab daily    Diovan Hct 320-25 Mg Tabs (Valsartan-hydrochlorothiazide) .Marland Kitchen... Take 1 tab daily  His updated medication list for this problem includes:    Carvedilol 25 Mg Tabs (Carvedilol) .Marland Kitchen... Take one tablet by mouth twice a day    Aspirin 81 Mg Tbec (Aspirin) ..... One by mouth daily    Furosemide 20 Mg Tabs (Furosemide) .Marland Kitchen... Take 3 tabs two times a day    Plavix 75 Mg Tabs (Clopidogrel bisulfate) .Marland Kitchen... Take 1 tab daily    Amlodipine Besylate 10 Mg Tabs (Amlodipine besylate) .Marland Kitchen...  Take 1 tab daily    Diovan Hct 320-25 Mg Tabs (Valsartan-hydrochlorothiazide) .Marland Kitchen... Take 1 tab daily  Problem # 3:  HYPERTENSION (ICD-401.9)  His pressure is a bit elevated today but he has not yet taken his meds.  A low sodium diet is requested. His updated medication list for this problem includes:    Carvedilol 25 Mg Tabs (Carvedilol) .Marland Kitchen... Take one tablet by mouth twice a day    Aspirin 81 Mg Tbec (Aspirin) ..... One by mouth daily  Furosemide 20 Mg Tabs (Furosemide) .Marland Kitchen... Take 3 tabs two times a day    Clonidine Hcl 0.1 Mg Tabs (Clonidine hcl) .Marland Kitchen... Take 1 tablet once daily two times a day    Amlodipine Besylate 10 Mg Tabs (Amlodipine besylate) .Marland Kitchen... Take 1 tab daily    Diovan Hct 320-25 Mg Tabs (Valsartan-hydrochlorothiazide) .Marland Kitchen... Take 1 tab daily  His updated medication list for this problem includes:    Carvedilol 25 Mg Tabs (Carvedilol) .Marland Kitchen... Take one tablet by mouth twice a day    Aspirin 81 Mg Tbec (Aspirin) ..... One by mouth daily    Furosemide 20 Mg Tabs (Furosemide) .Marland Kitchen... Take 3 tabs two times a day    Clonidine Hcl 0.1 Mg Tabs (Clonidine hcl) .Marland Kitchen... Take 1 tablet once daily two times a day    Amlodipine Besylate 10 Mg Tabs (Amlodipine besylate) .Marland Kitchen... Take 1 tab daily    Diovan Hct 320-25 Mg Tabs (Valsartan-hydrochlorothiazide) .Marland Kitchen... Take 1 tab daily

## 2010-05-04 NOTE — Miscellaneous (Signed)
Summary: Orders Update  Clinical Lists Changes  Problems: Added new problem of UNSPECIFIED PRE-OPERATIVE EXAMINATION (ICD-V72.84) - EP Procedure  generator change 10/23/09 Orders: Added new Test order of T-Chest x-ray, 2 views (71020) - Signed Added new Test order of T-CBC w/Diff [phone]) - Signed Added new Test order of T-Basic Metabolic Panel (99991111) - Signed Added new Test order of T-PTT [id number]) - Signed Added new Test order of T-Protime, Auto [id number]) - Signed Added new Referral order of Bi-V ICD (Bi-V ICD) - Signed

## 2010-05-04 NOTE — Letter (Signed)
Summary: Implantable Device Instructions  [facility] [place]. [address] [place] [address]   Phone: [phone]  Fax: [phone]      Implantable Device Instructions  You are scheduled for:  _____ Permanent Transvenous Pacemaker _____ Implantable Cardioverter Defibrillator _____ Implantable Loop Recorder __x___ Generator Change  on 10/23/09 with Dr. Lovena Le.  1.  Please arrive at the [place] [facility] at  am on the day of your procedure.  2.  Do not eat or drink the night before your procedure.  3.  Complete lab work on 10/16/09.    4.  Do NOT take these medications : furosemide, metformin, levemir  the morning  prior to your procedure:   5.  Plan for an overnight stay.  Bring your insurance cards and a list of your medications.  6.  Wash your chest and neck with antibacterial soap (any brand) the evening before and the morning of your procedure.  Rinse well.  7.  Education material received:     Pacemaker _____           ICD __x___           Arrhythmia _____  *If you have ANY questions after you get home, please call the office [phone].  *Every attempt is made to prevent procedures from being rescheduled.  Due to the nauture of Electrophysiology, rescheduling can happen.  The physician is always aware and directs the staff when this occurs.

## 2010-05-04 NOTE — Miscellaneous (Signed)
Summary: PT plan of care  PT plan of care   Imported By: Ruffin Pyo 09/29/2009 11:51:14  _____________________________________________________________________  External Attachment:    Type:   Image     Comment:   External Document

## 2010-05-04 NOTE — Letter (Signed)
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, [place] [address]   Phone: [phone]  Fax: [phone]    11/04/2009  TO: Dorathy Daft IT MAY CONCERN   RE: [name] [address] [id number]   The above named individual is under my medical care and may return to work on:11/10/09 with no heavy lifting for the month of August.    If you have any further questions or need additional information, please call.     Sincerely,    Alma Friendly, LPN

## 2010-05-04 NOTE — Letter (Signed)
Summary: Implantable Device Instructions  [facility] [place]. [address], [place] [address]   Phone: [phone]  Fax: [phone]      Implantable Device Instructions  You are scheduled for:  _____ Permanent Transvenous Pacemaker _____ Implantable Cardioverter Defibrillator _____ Implantable Loop Recorder __x___ Generator Change  on 10/23/09 with Dr. Lovena Le.  1.  Please arrive at the [facility] at 5:30am on the day of your procedure.  2.  Do not eat or drink the night before your procedure.  3.  Complete lab work on 10/16/09.    4.  Do NOT take these medications :furosemide,neformin,leviemir pen then morning  prior to your procedure:    5.  Plan for an overnight stay.  Bring your insurance cards and a list of your medications.  6.  Wash your chest and neck with antibacterial soap (any brand) the evening before and the morning of your procedure.  Rinse well.  7.  Education material received:     Pacemaker _____           ICD __x___           Arrhythmia _____  *If you have ANY questions after you get home, please call the office [phone].  *Every attempt is made to prevent procedures from being rescheduled.  Due to the nauture of Electrophysiology, rescheduling can happen.  The physician is always aware and directs the staff when this occurs.

## 2010-05-04 NOTE — Letter (Signed)
Summary: Appointment - Missed  [facility] [place]. [address], [place] [address]   Phone: [phone]  Fax: [phone]     June 10, 2009 MRN: [id number]   [name] [address] [place], [place]  [address]   Dear [name],  Our records indicate you missed your appointment on  06/10/09 DR Lovena Le                                    It is very important that we reach you to reschedule this appointment. We look forward to participating in your health care needs. Please contact us at the number listed above at your earliest convenience to reschedule this appointment.     Sincerely,    Public relations account executive

## 2010-05-04 NOTE — Medication Information (Signed)
Summary: Visual merchandiser   Imported By: Ihor Austin 09/15/2009 18:46:59  _____________________________________________________________________  External Attachment:    Type:   Image     Comment:   External Document

## 2010-05-04 NOTE — Letter (Signed)
Summary: History form  History form   Imported By: Ruffin Pyo 09/21/2009 12:49:45  _____________________________________________________________________  External Attachment:    Type:   Image     Comment:   External Document

## 2010-05-04 NOTE — Letter (Signed)
Summary: Appointment - Missed  [facility] [place]. [address], [place] [address]   Phone: [phone]  Fax: [phone]     September 07, 2009 MRN: [id number]   MASAYUKI BURGHARDT [address] [place], [place]  [address]   Dear Mr. Maltese,  Our records indicate you missed your appointment on            09/07/09 DR ROTHBART                                    It is very important that we reach you to reschedule this appointment. We look forward to participating in your health care needs. Please contact us at the number listed above at your earliest convenience to reschedule this appointment.     Sincerely,    Public relations account executive

## 2010-05-04 NOTE — Cardiovascular Report (Signed)
Summary: Office Visit   Office Visit   Imported By: Sallee Provencal 11/09/2009 15:08:30  _____________________________________________________________________  External Attachment:    Type:   Image     Comment:   External Document

## 2010-05-04 NOTE — Miscellaneous (Signed)
Summary: Device change out  Clinical Lists Changes  Observations: Added new observation of ICD IMPL DTE: 10/23/2009 (10/27/2009 8:02) Added new observation of ICD SERL#: [id number]  (10/27/2009 8:02) Added new observation of ICD MODL#: [id number]  (10/27/2009 8:02) Added new observation of ICDEXPLCOMM: 10/23/09 [employer] [id number] explanted  (10/27/2009 8:02)       ICD Specifications Following MD:  Cristopher Peru, MD     ICD Vendor:  [employer]     ICD Model Number:  E102     ICD Serial Number:  [id number] ICD DOI:  10/23/2009     ICD Implanting MD:  Cristopher Peru, MD  Lead 1:    Location: RV     DOI: 11/12/2003     Model #: [id number]     Serial #: [id number]     Status: active  Indications::  NICM  Explantation Comments: 10/23/09 [employer] [id number]/[id number] explanted  ICD Follow Up ICD Dependent:  No      Episodes Coumadin:  No  Brady Parameters Mode VVI     Lower Rate Limit:  40      Tachy Zones VF:  210     VT:  175

## 2010-05-06 NOTE — Letter (Signed)
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 [address], [place] [address]   Phone: [phone]  Fax: [phone]        April 29, 2010 MRN: [id number]    [name] [address] [place], [place]  [address]    Dear [name],    We have been unable to contact you by phone. Please call our office @ [phone] regarding scheduling an appointment to have your defibrillator checked.            Sincerely,  Alphonsus Sias [facility]  This letter has been electronically signed by your physician.

## 2010-06-19 LAB — SURGICAL PCR SCREEN: MRSA, PCR: NEGATIVE

## 2010-06-19 LAB — GLUCOSE, CAPILLARY
Glucose-Capillary: 111 mg/dL — ABNORMAL HIGH (ref 70–99)
Glucose-Capillary: 124 mg/dL — ABNORMAL HIGH (ref 70–99)

## 2010-06-29 ENCOUNTER — Ambulatory Visit (INDEPENDENT_AMBULATORY_CARE_PROVIDER_SITE_OTHER): Payer: BC Managed Care – PPO | Admitting: Vascular Surgery

## 2010-06-29 ENCOUNTER — Encounter (INDEPENDENT_AMBULATORY_CARE_PROVIDER_SITE_OTHER): Payer: BC Managed Care – PPO

## 2010-06-29 DIAGNOSIS — I70219 Atherosclerosis of native arteries of extremities with intermittent claudication, unspecified extremity: Secondary | ICD-10-CM

## 2010-06-29 DIAGNOSIS — L97809 Non-pressure chronic ulcer of other part of unspecified lower leg with unspecified severity: Secondary | ICD-10-CM

## 2010-06-29 NOTE — Assessment & Plan Note (Addendum)
OFFICE VISIT  [name] [name], [name] [name] DOB:  1955/11/17                                       06/29/2010 [id number]  The patient is [name] patient well-known to our practice having previously undergone [name] right below knee amputation by me on 10/30/2008 for [name] nonhealing right first toe amputation site with [name] tibial occlusive disease.  He does have type 1 diabetes mellitus.  He has now developed an ulcer on the anterior aspect of the left first toe which is slightly improved but not healed over the last 4-6 weeks.  He does not know the etiology of this, does not remember traumatizing it.  CHRONIC MEDICAL PROBLEMS: 1. Type 1 diabetes mellitus. 2. Hypertension. 3. Hyperlipidemia. 4. History of atrial fibrillation with implantable defibrillator     placed in 2005. 5. Mild left brain stroke. 6. Negative for coronary artery disease or COPD.  FAMILY HISTORY:  Positive for stroke and diabetes in his father, stroke in his brother, negative for coronary artery disease.  SOCIAL HISTORY:  The patient smokes [name] pack of cigarettes [name] day for 30 plus years, has not smoked since 2005, does not use alcohol.  REVIEW OF SYSTEMS:  Positive for atrial fibrillation, negative for chest pain, has occasional dyspnea on exertion.  Ambulates well with [name] prosthesis on his right leg amputation.  All other systems are negative.  PHYSICAL EXAMINATION:  Vital signs:  Blood pressure is 179/93, heart rate 76, respirations 14, temperature 98.2.  General:  He is [name] well- developed, well-nourished male in no apparent distress, alert and oriented x3.  HEENT:  Exam normal for age.  Lungs:  Clear to auscultation.  No rhonchi or wheezing.  Cardiovascular:  Regular rhythm. Carotid pulses 3+, no audible bruits.  Abdomen:  Obese.  No palpable masses.  Lower extremity:  Exam reveals right below knee amputation which is well-healed with prosthesis.  Left leg has 3+ femoral, 1 to 2+ popliteal, no  distal pulses palpable.  He does have some hyperpigmentation in the lower leg.  There is [name] well circumscribed ulcer just proximal to the left first nail bed measuring about [name] centimeter in diameter with [name] dry eschar and no cellulitis.  Today I ordered lower extremity arterial Dopplers which I reviewed and interpreted.  ABI on the left is 0.92 with biphasic flow in the anterior tibial artery.  The patient does have known [name] tibial disease having previously had an angiogram of the right leg and not the left leg.  He has [name] known creatinine of 1.5, BUN of 30.  I think the best plan would be to obtain an angiogram of the left leg to be certain he does not have some lesion which could be amenable to PTA or stenting.  I have scheduled that for Thursday, April 19 by Dr. Bridgett [name]. He has [name] remote history of [name] dye reaction with hives but has not had problems since that time with contrast.    [name] [name]. [name] [name], M.D. Electronically Signed  JDL/MEDQ  D:  06/29/2010  T:  06/29/2010  Job:  [id number]

## 2010-07-10 LAB — URINE MICROSCOPIC-ADD ON

## 2010-07-10 LAB — URINALYSIS, ROUTINE W REFLEX MICROSCOPIC
Bilirubin Urine: NEGATIVE
Glucose, UA: NEGATIVE mg/dL
Hgb urine dipstick: NEGATIVE
Ketones, ur: NEGATIVE mg/dL
pH: 5 (ref 5.0–8.0)

## 2010-07-10 LAB — BASIC METABOLIC PANEL
BUN: 20 mg/dL (ref 6–23)
BUN: 49 mg/dL — ABNORMAL HIGH (ref 6–23)
CO2: 34 mEq/L — ABNORMAL HIGH (ref 19–32)
Calcium: 9.4 mg/dL (ref 8.4–10.5)
Chloride: 94 mEq/L — ABNORMAL LOW (ref 96–112)
Creatinine, Ser: 1.19 mg/dL (ref 0.4–1.5)
GFR calc Af Amer: >60 mL/min (ref >60)
GFR calc non Af Amer: 53 mL/min — ABNORMAL LOW (ref >60)
Potassium: 4.7 mEq/L (ref 3.5–5.1)
Sodium: 138 mEq/L (ref 135–145)
Sodium: 141 mEq/L (ref 135–145)

## 2010-07-10 LAB — GLUCOSE, CAPILLARY
Glucose-Capillary: 112 mg/dL — ABNORMAL HIGH (ref 70–99)
Glucose-Capillary: 113 mg/dL — ABNORMAL HIGH (ref 70–99)
Glucose-Capillary: 113 mg/dL — ABNORMAL HIGH (ref 70–99)
Glucose-Capillary: 114 mg/dL — ABNORMAL HIGH (ref 70–99)
Glucose-Capillary: 114 mg/dL — ABNORMAL HIGH (ref 70–99)
Glucose-Capillary: 129 mg/dL — ABNORMAL HIGH (ref 70–99)
Glucose-Capillary: 131 mg/dL — ABNORMAL HIGH (ref 70–99)
Glucose-Capillary: 135 mg/dL — ABNORMAL HIGH (ref 70–99)
Glucose-Capillary: 135 mg/dL — ABNORMAL HIGH (ref 70–99)
Glucose-Capillary: 137 mg/dL — ABNORMAL HIGH (ref 70–99)
Glucose-Capillary: 139 mg/dL — ABNORMAL HIGH (ref 70–99)
Glucose-Capillary: 150 mg/dL — ABNORMAL HIGH (ref 70–99)
Glucose-Capillary: 154 mg/dL — ABNORMAL HIGH (ref 70–99)
Glucose-Capillary: 155 mg/dL — ABNORMAL HIGH (ref 70–99)
Glucose-Capillary: 157 mg/dL — ABNORMAL HIGH (ref 70–99)
Glucose-Capillary: 158 mg/dL — ABNORMAL HIGH (ref 70–99)
Glucose-Capillary: 174 mg/dL — ABNORMAL HIGH (ref 70–99)
Glucose-Capillary: 186 mg/dL — ABNORMAL HIGH (ref 70–99)
Glucose-Capillary: 59 mg/dL — ABNORMAL LOW (ref 70–99)
Glucose-Capillary: 71 mg/dL (ref 70–99)
Glucose-Capillary: 87 mg/dL (ref 70–99)
Glucose-Capillary: 89 mg/dL (ref 70–99)
Glucose-Capillary: 93 mg/dL (ref 70–99)
Glucose-Capillary: 94 mg/dL (ref 70–99)

## 2010-07-10 LAB — DIFFERENTIAL
Basophils Absolute: 0 10*3/uL (ref 0.0–0.1)
Basophils Relative: 0 % (ref 0–1)
Eosinophils Absolute: 0.2 10*3/uL (ref 0.0–0.7)
Eosinophils Relative: 2 % (ref 0–5)
Monocytes Absolute: 0.8 10*3/uL (ref 0.1–1.0)

## 2010-07-10 LAB — CBC
HCT: 24.9 % — ABNORMAL LOW (ref 39.0–52.0)
HCT: 25.9 % — ABNORMAL LOW (ref 39.0–52.0)
HCT: 27.5 % — ABNORMAL LOW (ref 39.0–52.0)
HCT: 28.9 % — ABNORMAL LOW (ref 39.0–52.0)
MCV: 88.8 fL (ref 78.0–100.0)
Platelets: 246 10*3/uL (ref 150–400)
Platelets: 305 10*3/uL (ref 150–400)
Platelets: 319 10*3/uL (ref 150–400)
Platelets: 373 10*3/uL (ref 150–400)
RDW: 13.9 % (ref 11.5–15.5)
RDW: 14.4 % (ref 11.5–15.5)
WBC: 10.1 10*3/uL (ref 4.0–10.5)
WBC: 9.3 10*3/uL (ref 4.0–10.5)

## 2010-07-10 LAB — COMPREHENSIVE METABOLIC PANEL
ALT: 15 U/L (ref 0–53)
AST: 16 U/L (ref 0–37)
Albumin: 2.4 g/dL — ABNORMAL LOW (ref 3.5–5.2)
Alkaline Phosphatase: 57 U/L (ref 39–117)
BUN: 44 mg/dL — ABNORMAL HIGH (ref 6–23)
Chloride: 97 mEq/L (ref 96–112)
Potassium: 3.9 mEq/L (ref 3.5–5.1)
Total Bilirubin: 0.5 mg/dL (ref 0.3–1.2)

## 2010-07-11 LAB — BASIC METABOLIC PANEL
GFR calc non Af Amer: >60 mL/min (ref >60)
Glucose, Bld: 188 mg/dL — ABNORMAL HIGH (ref 70–99)
Potassium: 3.9 mEq/L (ref 3.5–5.1)
Sodium: 136 mEq/L (ref 135–145)

## 2010-07-11 LAB — CBC
HCT: 28.6 % — ABNORMAL LOW (ref 39.0–52.0)
Hemoglobin: 9.6 g/dL — ABNORMAL LOW (ref 13.0–17.0)
RBC: 3.23 MIL/uL — ABNORMAL LOW (ref 4.22–5.81)
RDW: 14 % (ref 11.5–15.5)
WBC: 9.7 10*3/uL (ref 4.0–10.5)

## 2010-07-11 LAB — GLUCOSE, CAPILLARY
Glucose-Capillary: 119 mg/dL — ABNORMAL HIGH (ref 70–99)
Glucose-Capillary: 151 mg/dL — ABNORMAL HIGH (ref 70–99)
Glucose-Capillary: 196 mg/dL — ABNORMAL HIGH (ref 70–99)
Glucose-Capillary: 211 mg/dL — ABNORMAL HIGH (ref 70–99)

## 2010-07-11 LAB — POCT I-STAT 4, (NA,K, GLUC, HGB,HCT)
Potassium: 3.7 mEq/L (ref 3.5–5.1)
Sodium: 142 mEq/L (ref 135–145)

## 2010-07-12 LAB — GLUCOSE, CAPILLARY
Glucose-Capillary: 111 mg/dL — ABNORMAL HIGH (ref 70–99)
Glucose-Capillary: 221 mg/dL — ABNORMAL HIGH (ref 70–99)

## 2010-07-12 LAB — POCT I-STAT 4, (NA,K, GLUC, HGB,HCT)
Glucose, Bld: 106 mg/dL — ABNORMAL HIGH (ref 70–99)
HCT: 41 % (ref 39.0–52.0)
Potassium: 3.6 mEq/L (ref 3.5–5.1)

## 2010-07-15 LAB — POCT I-STAT, CHEM 8
BUN: 26 mg/dL — ABNORMAL HIGH (ref 6–23)
Calcium, Ion: 1.11 mmol/L — ABNORMAL LOW (ref 1.12–1.32)
Chloride: 97 mEq/L (ref 96–112)
Glucose, Bld: 386 mg/dL — ABNORMAL HIGH (ref 70–99)
Potassium: 3.2 mEq/L — ABNORMAL LOW (ref 3.5–5.1)

## 2010-07-15 LAB — GLUCOSE, CAPILLARY
Glucose-Capillary: 394 mg/dL — ABNORMAL HIGH (ref 70–99)
Glucose-Capillary: 396 mg/dL — ABNORMAL HIGH (ref 70–99)

## 2010-07-22 ENCOUNTER — Ambulatory Visit (HOSPITAL_COMMUNITY)
Admission: RE | Admit: 2010-07-22 | Discharge: 2010-07-22 | Disposition: A | Discharge status: Home / Self Care | Payer: BC Managed Care – PPO | Source: Ambulatory Visit | Attending: Vascular Surgery | Admitting: Vascular Surgery

## 2010-07-22 DIAGNOSIS — L97509 Non-pressure chronic ulcer of other part of unspecified foot with unspecified severity: Secondary | ICD-10-CM | POA: Insufficient documentation

## 2010-07-22 DIAGNOSIS — I70219 Atherosclerosis of native arteries of extremities with intermittent claudication, unspecified extremity: Secondary | ICD-10-CM

## 2010-07-22 DIAGNOSIS — S88119A Complete traumatic amputation at level between knee and ankle, unspecified lower leg, initial encounter: Secondary | ICD-10-CM | POA: Insufficient documentation

## 2010-07-22 DIAGNOSIS — L98499 Non-pressure chronic ulcer of skin of other sites with unspecified severity: Secondary | ICD-10-CM | POA: Insufficient documentation

## 2010-07-22 DIAGNOSIS — I739 Peripheral vascular disease, unspecified: Secondary | ICD-10-CM | POA: Insufficient documentation

## 2010-07-22 DIAGNOSIS — E119 Type 2 diabetes mellitus without complications: Secondary | ICD-10-CM | POA: Insufficient documentation

## 2010-07-22 LAB — POCT I-STAT, CHEM 8
BUN: 50 mg/dL — ABNORMAL HIGH (ref 6–23)
Creatinine, Ser: 2 mg/dL — ABNORMAL HIGH (ref 0.4–1.5)
Glucose, Bld: 254 mg/dL — ABNORMAL HIGH (ref 70–99)
Hemoglobin: 12.9 g/dL — ABNORMAL LOW (ref 13.0–17.0)
Potassium: 3.7 mEq/L (ref 3.5–5.1)
Sodium: 142 mEq/L (ref 135–145)

## 2010-07-26 ENCOUNTER — Other Ambulatory Visit (HOSPITAL_COMMUNITY): Payer: Self-pay | Admitting: Family Medicine

## 2010-07-26 DIAGNOSIS — R7989 Other specified abnormal findings of blood chemistry: Secondary | ICD-10-CM

## 2010-07-26 NOTE — Op Note (Signed)
[name], [name] NO.:  [id number]  MEDICAL RECORD NO.:  [id number]           PATIENT TYPE:  O  LOCATION:  [facility]                         FACILITY:  [facility]  PHYSICIAN:  Conrad Union, MD       DATE OF BIRTH:  10-18-1955  DATE OF PROCEDURE:  07/22/2010 DATE OF DISCHARGE:  07/22/2010                              OPERATIVE REPORT   PROCEDURE: 1. Right common femoral artery cannulation under ultrasound guidance. 2. Aortogram with carbon dioxide contrast. 3. Second-order arterial selection. 4. Left leg runoff with carbon dioxide and IV dye contrast.  PREOPERATIVE DIAGNOSIS:  Left toe ulcer.  POSTOPERATIVE DIAGNOSIS:  Left toe ulcer.  SURGEON:  Aaron Edelman L. Bridgett Larsson, MD  ANESTHESIA:  Conscious sedation.  ESTIMATED BLOOD LOSS:  Minimal.  CONTRAST:  42 mL.  SPECIMENS:  None.  FINDINGS: 1. Patent aorta. 2. Patent bilateral renal arteries. 3. Patent bilateral common iliac artery, external iliac artery, and    internal iliac artery. 4. Patent left common femoral artery, profunda artery, and superficial     femoral artery. 5. Patent left popliteal artery and trifurcation. 6. The left posterior tibial occludes shortly after its takeoff. 7. The left peroneal artery is patent proximally but then occludes a     few centimeters after its takeoff. 8. The left anterior tibial artery is patent until about 3 cm proximal     to the ankle joint where it occludes. 9. The left anterior tibial collaterals reconstitute the     peroneal artery distally, which feed medial tarsal branch, which is     diseased at the level of the plantar arch. 10.There is a diminutive plantar arch that is filled by the medial     tarsal artery. 11.No dorsal arch or toe perfusion visualized on this injection. 12.Calcified tibial vessels are evident.  INDICATIONS:  This is a 55 year old gentleman with diabetes with previous history of right below-the-knee amputation and presents with complaint of  left toe ulcer.  Based on his examination, Dr. [name] felt that the angiogram on his left leg was necessary prior to provide prognostic information and also determining any therapeutic options he had.  The patient is aware of the risks of this procedure including bleeding, infection, possible access complications, possible renal failure related to the contrast dye, possible embolization, and possible need for additional procedures.  DESCRIPTION OF OPERATION:  After full informed written consent was obtained from the patient, he was brought back to angio suite and placed supine upon the angio table.  He was then connected to monitoring equipment and given conscious sedation, the amounts of which are documented in this chart.  He was then prepped and draped in the standard fashion for an aortogram and left leg runoff.  I turned my attention to his right groin. Under ultrasound guidance, I cannulated the common femoral artery and passed a Bentson wire up into his aorta.  At this point, I attempted to pass 5-French sheath, however, the wire began to bend, so we obtained a 4-French end-hole catheter, which was loaded over the wire and advanced into the aorta.  We  then exchanged the wire for an Amplatz wire.  The catheter was then removed, and then a 5-French sheath was loaded over this Amplatz wire into the common femoral artery.  A Omni flush catheter was then loaded over this wire.  I then exchanged this wire for the Bentson wire.  Using the Omni flush catheter and Bentson wire, I was able to select out the left common iliac artery and advance the wire down into the external iliac artery; however, the catheter would not advance so at this point the catheter exchanged for a end-hole catheter, which was able to cross the bifurcation and placed it at the level of the left distal external iliac artery.  At this point, the wire was removed the catheter was connected to the carbon dioxide circuit.   Hand injections were completed, the findings of which are listed above.  The carbon dioxide images were adequate down to the level of the tibial plateau at which point we switched over to intravenous dye.  Hand injections were completed, the findings of which are listed above, in total only 42 mL was necessary.  Based on the findings, I felt also a repeat aortogram was necessary to determine if there was any iliac lesion given the slow progression of the carbon dioxide dye in his proximal femoral system, so I pulled the end-hole catheter back in the aorta, put the Bentson wire back into the aorta, and exchanged the catheter for an Omni flush catheter, which was then loaded to level L1.  This catheter was connected to the CO2 circuit. Hand injections were completed, the findings of which are listed above. Based on these findings, I did not feel that any surgical intervention or endovascular intervention would be successful in this patient.  At this point, I pulled out all the catheters after reconstituting the crook of the catheter with Bentson wire and then aspirated the sheath. No clots were present and flushed the sheath with heparinized saline.  At this point, the plan is to pull the sheath in the holding area.  COMPLICATIONS:  None.  CONDITION:  Stable.     Conrad Antlers, MD     BLC/MEDQ  D:  07/22/2010  T:  07/23/2010  Job:  [id number]  Electronically Signed by Adele Barthel MD on 07/26/2010 02:10:16 PM

## 2010-07-27 ENCOUNTER — Ambulatory Visit (HOSPITAL_COMMUNITY)
Admission: RE | Admit: 2010-07-27 | Discharge: 2010-07-27 | Disposition: A | Discharge status: Home / Self Care | Payer: BC Managed Care – PPO | Source: Ambulatory Visit | Attending: Family Medicine | Admitting: Family Medicine

## 2010-07-27 DIAGNOSIS — R7989 Other specified abnormal findings of blood chemistry: Secondary | ICD-10-CM

## 2010-07-27 DIAGNOSIS — I10 Essential (primary) hypertension: Secondary | ICD-10-CM | POA: Insufficient documentation

## 2010-07-27 DIAGNOSIS — R944 Abnormal results of kidney function studies: Secondary | ICD-10-CM | POA: Insufficient documentation

## 2010-07-27 DIAGNOSIS — E119 Type 2 diabetes mellitus without complications: Secondary | ICD-10-CM | POA: Insufficient documentation

## 2010-07-27 DIAGNOSIS — Q619 Cystic kidney disease, unspecified: Secondary | ICD-10-CM | POA: Insufficient documentation

## 2010-07-28 IMAGING — CR DG CHEST 2V
2 series · 2 of 2 positions shown · non-contrast
Comparison: 11/13/2003

CLINICAL DATA: Pre admit for gangrene right foot

CHEST - 2 VIEW

[view not recorded (1 of 2)]
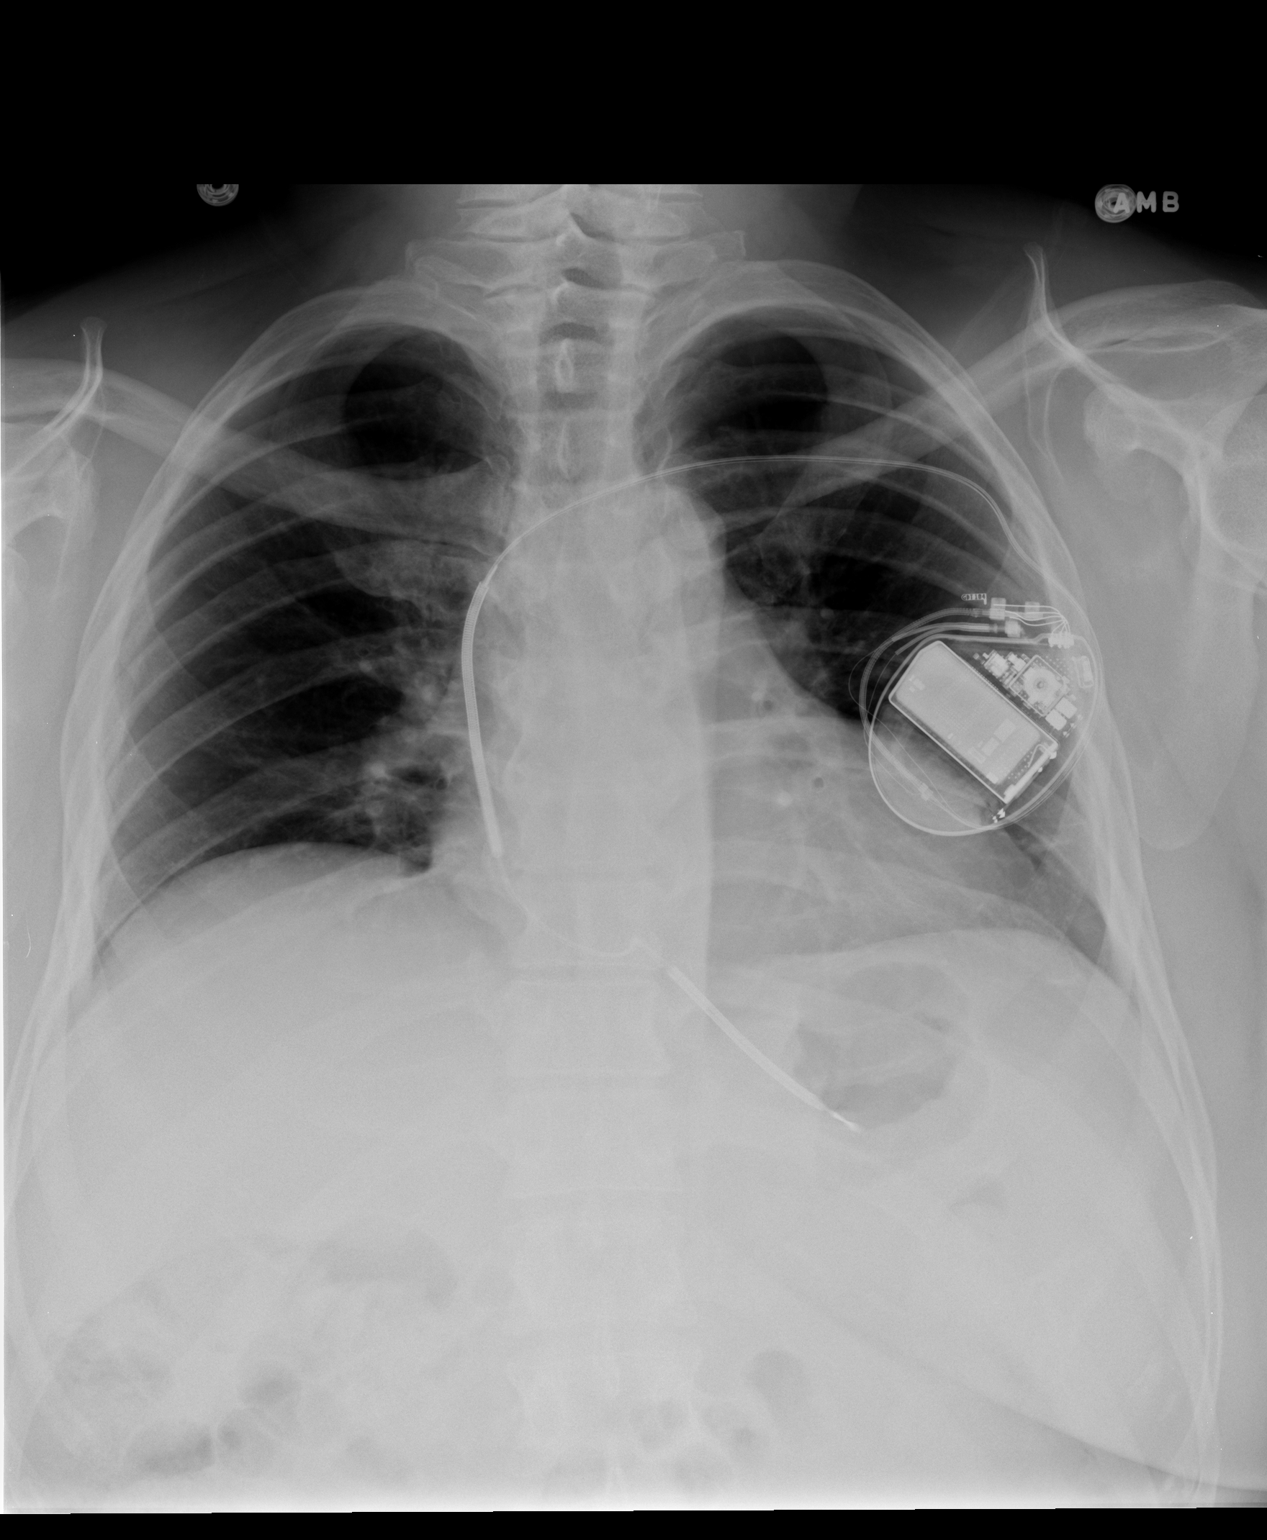

[view not recorded (2 of 2)]
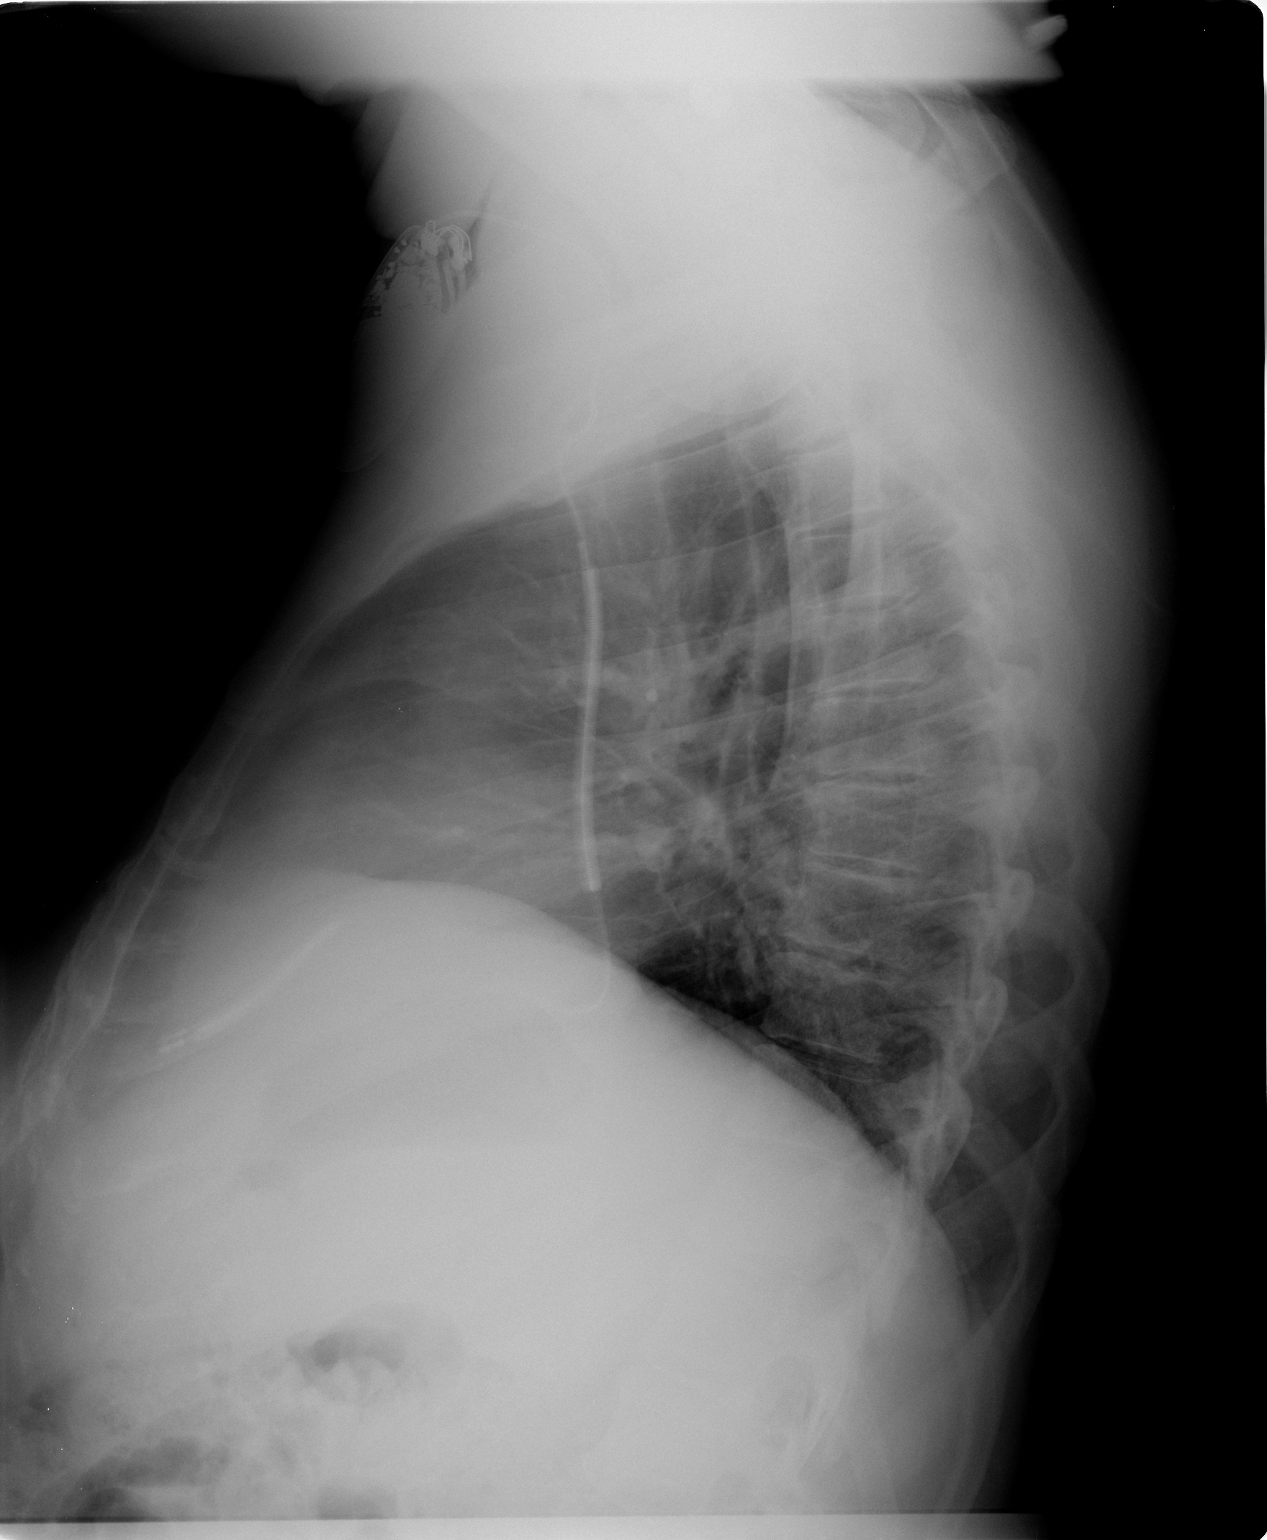

[2 of 2 positions shown; findings below may reference images not displayed]

FINDINGS: Mild cardiomegaly with single lead AICD.  No congestive
heart failure, pleural fluid, or interval change.  Lungs clear.
Osseous structures intact.
IMPRESSION: Mild cardiomegaly with AICD - no congestive heart failure or active
disease.

## 2010-08-17 NOTE — Op Note (Signed)
NAMEJARL, SAGAL NO.:  [id number]   MEDICAL RECORD NO.:  [id number]           PATIENT TYPE:   LOCATION:                                 FACILITY:   PHYSICIAN:  Theotis Burrow IV, MD     DATE OF BIRTH:   DATE OF PROCEDURE:  06/24/2008  DATE OF DISCHARGE:                               OPERATIVE REPORT   PREOPERATIVE DIAGNOSIS:  Right leg ulcer.   POSTOPERATIVE DIAGNOSIS:  Right leg ulcer.   PROCEDURE PERFORMED:  1. Ultrasound access, left femoral artery.  2. Abdominal aortogram.  3. Right lower extremity runoff.  4. Second-order catheterization.   INDICATIONS:  This is a 55 year old male with an ulcer on his right  first toenail, present for approximately 6 months.  Duplex ultrasound  reveals severe tibial disease.  He comes in for arteriogram to evaluate  for revascularization.   PROCEDURE:  The patient was identified in the holding and taken to room  8.  He was placed supine on the table.  Bilateral groins were prepped  and draped in standard sterile fashion.  Time-out was called.  The left  femoral artery was evaluated with ultrasound and found to be widely  patent.  It was accessed under ultrasound guidance with an 18-gauge  needle.  An 0.035 Bentson wire was advanced into the aorta under  fluoroscopic visualization.  A 5-French sheath was placed.  Over the  wire, Omni flush catheter was advanced at the level of L1 and abdominal  aortogram was obtained.  Next, using the Omni flush catheter, a Bentson  wire, and a 4-French end-hole catheter, aortic bifurcation was crossed  and catheter was placed in the right external iliac artery and right  lower extremity runoff was obtained.   FINDINGS:  Aortogram:  The visualized portions of the suprarenal  abdominal aorta showed minimal disease.  There are single renal arteries  bilaterally which are widely patent.  There are no hemodynamically  significant lesions within the infrarenal abdominal aorta.   Bilateral  common iliac arteries are widely patent without significant stenoses.  Bilateral external iliac arteries are widely patent.  Bilateral  hypogastric arteries are widely patent.   Right lower extremity:  The right common femoral artery is widely patent  without significant stenosis.  The right profunda femoral artery is  widely patent.  The right superficial femoral artery is patent without  significant stenosis.  The right popliteal artery is widely patent.  The  patient has severe tibial disease.  The anterior tibial artery is patent  proximally, however, occludes in the mid calf.  The posterior tibial  artery is patent down to the upper calf.  There is reconstitution from  tibial collaterals of the peroneal artery in the mid leg.  The peroneal  artery then gives collaterals to the posterior tibial at the level of  the ankle.   After above images were obtained, decision was made to stop the  procedure.  Catheters and wires were removed.  The patient was taken to  the holding area for sheath pull.   IMPRESSION:  1. No evidence of aortoiliac or femoral disease.  2. Severe tibial disease with reconstitution of the peroneal artery in      the lower leg which provides collaterals to the posterior tibial to      get blood flow to the foot.           ______________________________  V. Leia Alf, MD  Electronically Signed     VWB/MEDQ  D:  06/24/2008  T:  06/25/2008  Job:  [id number]

## 2010-08-17 NOTE — Op Note (Signed)
NAMEDHAVAL, RADCLIFF NO.:  [id number]   MEDICAL RECORD NO.:  [id number]          PATIENT TYPE:  AMB   LOCATION:  SDS                          FACILITY:  [facility]   PHYSICIAN:  [name]. [name], M.D.  DATE OF BIRTH:  1955-08-22   DATE OF PROCEDURE:  09/03/2008  DATE OF DISCHARGE:  09/03/2008                               OPERATIVE REPORT   PREOPERATIVE DIAGNOSIS:  Gangrene, right first toe secondary to severe  tibial occlusive disease.   POSTOPERATIVE DIAGNOSIS:  Gangrene, right first toe secondary to severe  tibial occlusive disease.   OPERATIONS:  Transmetatarsal amputation, right first toe.   SURGEON:  [name]. [name], MD   FIRST ASSISTANT:  Nurse.   ANESTHESIA:  General.   PROCEDURE:  The patient was taken to the operating room and placed in a  supine position at which time satisfactory general anesthesia was  administered.  The right foot was prepped with Betadine scrub and  solution and draped in routine sterile manner.  There was an open wound  in the distal end of the right first toe involving the distal phalanx.  The skin at the base of the toe was not ulcerated but was somewhat thin.  A tennis-racket-type incision was made at the base of the right first  toe extending down the metatarsal head.  The incision was carried down  through skin and subcutaneous tissue with the scalpel.  The  metatarsophalangeal joint was disarticulated and specimen removed from  the table.  The first metatarsal head was then cleaned with periosteal  elevator and transected with a bone cutter.  There was some bleeding  coming from the skin edges which was encouraging.  There was no  purulence in the deep space.  Following thorough irrigation, wound was  closed in layers with interrupted 3-0 Vicryl and interrupted 3-0 nylon.  Sterile dressing was applied.  The patient was taken to recovery room in  satisfactory condition.      [name] [name], M.D.  Electronically  Signed     JDL/MEDQ  D:  09/03/2008  T:  09/04/2008  Job:  [id number]

## 2010-08-17 NOTE — Assessment & Plan Note (Signed)
OFFICE VISIT   GAETON, MELGAREJO A  DOB:  1955/07/07                                       09/30/2008  [id number]   The patient returns today for further examination of his right first toe  amputation site.  The posterior flap continues to look reasonably  healthy but there has been separation of the wound about 0.5 to 1 cm in  diameter.  Tissue beneath this is somewhat indolent.  Skin on the  anterior part of the foot is not of good quality as it was  preoperatively.   I suspect that this will not heal but it certainly does not appear  infected at this time and does not definitely need to have a more  proximal amputation although this will probably be necessary.  We will  see him back in 2 weeks.  Continue daily dressing changes.  We will  leave the sutures in for now.   [name] [name], M.D.  Electronically Signed   JDL/MEDQ  D:  09/30/2008  T:  10/01/2008  Job:  [id number]

## 2010-08-17 NOTE — Assessment & Plan Note (Signed)
OFFICE VISIT   [name], BONSALL A  DOB:  01/12/1956                                       08/05/2008  [id number]   The patient is a 55 year old gentleman with insulin diabetes mellitus  who has a nonhealing ulcer in the tip of the right first toe.  It has  been present for a few months.  It started as an ingrown toenail.  He  was known to have tibial disease and underwent angiography by Dr.  Trula Slade on March 23 to see if he was a candidate for revascularization.  He has a widely patent right superficial femoral and popliteal artery  with severe tibial disease and reconstitution of a small distal peroneal  artery, not felt to be a good candidate for revascularization because of  severe tibial disease.  The ulcer on the toe is stable and about 1 cm in  diameter and he is receiving hyperbaric oxygen treatment in the wound  center by Dr. Nils Pyle.  He is also on aspirin and Plavix.   On exam he continues to have an excellent femoral and popliteal pulse  palpable in the right leg with no distal pulses and changes in the right  leg below the knee of chronic psoriasis with the ulcer as described.  Blood pressure 170/95, heart rate is 85.   The patient was advised he is not a candidate for revascularization and  will continue to be followed in the wound center by Dr. Nils Pyle.  If  this continues to progress attempt at a right first toe amputation would  be indicated which may or may not heal.   [name]. [name], M.D.  Electronically Signed   JDL/MEDQ  D:  08/05/2008  T:  08/06/2008  Job:  [id number]   cc:   Joneen Boers A. Nils Pyle, M.D.

## 2010-08-17 NOTE — Assessment & Plan Note (Signed)
OFFICE VISIT   [name], [name]  DOB:  12-15-1955                                       04/28/2009  [id number]   The patient returns for continued followup regarding his right below  knee amputation I performed 10/30/2008.  He has completed his prosthesis  fitting and has had the prosthesis now for 2 months and is functioning  well with the right below knee amputation prosthesis.  He is ambulating  without difficulty and feels he is ready to return to work.  He denies  any chest pain, dyspnea on exertion, PND, orthopnea.  No left leg  symptoms such as nonhealing ulcers or pain.  He had previously had [name]  slowly healing area in the mid portion of his right below knee  amputation stump but states that that has now completely healed.   PHYSICAL EXAMINATION:  Vital signs:  Blood pressure 206/105, heart rate  88, respirations 14, temperature 98.6.  General:  He is [name] well-  developed, well-nourished male who is in no apparent distress.  HEENT:  Exam is unremarkable.  Chest:  Clear to auscultation.  Cardiovascular:  Regular rhythm.  No murmurs.  Extremity exam:  Reveals right leg had [name]  3+ femoral and 2+ popliteal pulse with [name] well-healed below knee  amputation stump which has contracted nicely and has [name] full range of  motion.  The area in the mid portion that was slow to heal has now  completely healed with no evidence of infection or drainage.  The left  leg is free of any ischemic areas or ulcers.   I feel that he is ready to return to work on [name] full-time basis and have  filled out the appropriate paperwork for this for him today.  If he  develops any problems with the healing of the right below knee  amputation stump he will be in touch with [place].  We will see him on [name]  p.r.n. basis.     [name] [name], M.D.  Electronically Signed   JDL/MEDQ  D:  04/28/2009  T:  04/29/2009  Job:  [id number]

## 2010-08-17 NOTE — Assessment & Plan Note (Signed)
[facility] OFFICE NOTE   NAME:[name], [name]                        MRN:          [id number]  DATE:08/19/2008                            DOB:          1955-10-21    [name] comes in today for followup of the following issues.  1. Nonischemic cardiomyopathy.  2. Chronic systolic heart failure, currently class II  3. Status post implantable cardioverter defibrillator.  4. Hypertension.  5. Diabetes.   He is followed closely by Dr. Everette Rank in the outpatient setting.  Dr.  Everette Rank checked blood work last week, which [name] says was fine  including his potassium.   He has a nonhealing ulcer on his right hallux.  He has been presented  with either a right toe amputation or below the right knee amputation by  Dr. Victorino Dike.  Please see Dr. Evelena Leyden note from Aug 05, 2008.   He denies any orthopnea, PND, or increased peripheral edema.  He takes 2  diuretics including metolazone, which has kept it stable.  His blood  pressure is been under good control.  He has had no tachy palpitations,  syncope or presyncope.  He is also had some mild substernal chest  discomfort, but it does not sound exertional.   His diabetes has been out of control, as he puts it.  He has spells  where he gets sweaty, which may be hypoglycemia.   MEDICATIONS:  Include  1. Metformin 500 mg p.o. b.i.d.  2. Vitamin B12 daily.  3. Plavix 75 mg per day.  4. Hydralazine 50 mg t.i.d.  5. Lyrica 100 mg p.o. b.i.d.  6. Amlodipine 10 mg per day.  7. Diovan 160 mg p.o. b.i.d.  8. Carvedilol 25 mg p.o. b.i.d.  9. Aspirin 81 mg per day.  10.Potassium 20 mEq a day.  11.Levemir as instructed.  12.Apidra 100 units daily.  13.Furosemide 20 mg p.o. b.i.d.  14.Crestor 40 mg a day.  15.Metolazone 5 mg p.o. daily.   PHYSICAL EXAMINATION:  GENERAL:  He is a very nice gentleman, in no  acute distress.  Looks older than his stated age.  He is  bearded.  HEENT:  Otherwise negative and his teeth are in good shape.  NECK:  Supple.  There is no JVD.  Carotids upstrokes were equal  bilaterally without bruits.  Thyroid is not enlarged.  Trachea is  midline.  LUNGS:  Clear to auscultation and percussion.  HEART:  Reveals displaced PMI, has a normal S1 and S2.  No gallop.  ABDOMEN:  Soft, good bowel sounds.  No midline bruit.  EXTREMITIES:  Reveal no edema.  He has barely palpable pulse in the  dorsalis pedis on the left, nonpalpable on the right.  There is no sign  of DVT.  SKIN:  Shows psoriatic changes and some chronic scarring.  NEUROLOGIC:  Grossly intact.   ASSESSMENT AND PLAN:  [name] is unfortunately facing either a right  toe amputation or right below-knee amputation for nonhealing ulcer from  severe distal vascular disease as outlined by Dr. [name].  He is low  risk from my standpoint for surgery and I have cleared him by note.  I  have made no changes in medical program.  I have told him to keep a  close watch on his potassium particularly with taking metolazone. We  will see him back for followup in 3 months.   He will have to stop his Plavix a week before surgery per Dr. [name].  I  did not do this today because I do not know the timing of the surgery.     Thomas C. Verl Blalock, MD, [facility]  Electronically Signed    TCW/MedQ  DD: 08/19/2008  DT: 08/20/2008  Job #: [id number]   cc:   [name]. [name], M.D.  Angus G. Everette Rank, MD

## 2010-08-17 NOTE — Discharge Summary (Signed)
NAMEJOSEPHANTHONY, [name] NO.:  [id number]   MEDICAL RECORD NO.:  [id number]          PATIENT TYPE:  IPS   LOCATION:  [id number]                         FACILITY:  [facility]   PHYSICIAN:  Charlett Blake, M.D.DATE OF BIRTH:  06/16/1955   DATE OF ADMISSION:  11/05/2008  DATE OF DISCHARGE:                               DISCHARGE SUMMARY   DISCHARGE DIAGNOSES:  1. Right below-knee amputation on October 30, 2008, secondary to severe      tibial occlusive disease.  2.  Subcutaneous Lovenox for deep vein      thrombosis prophylaxis.  2. Pain management.  3. Nonischemic cardiomyopathy with a single-chamber defibrillator in      2005.  4. Insulin-dependent diabetes mellitus.  5. Hypertension.  6. Hyperlipidemia.  7. Anemia.  8. Anxiety.  9. Chronic skin disorder.   This is a 55 year old male with insulin-dependent diabetes mellitus  admitted on October 30, 2008, with a recent transmetatarsal amputation of  the right first toe, on September 03, 2008, secondary to gangrene with a poor  healing of site and no change with conservative care.  The patient with  severe tibial occlusive disease and limb not felt to be salvageable.  He  ultimately underwent a right below-knee amputation on October 30, 2008, per  Dr. [name].  Subcutaneous Lovenox for deep vein thrombosis prophylaxis.  Blood sugars monitored with good control on Lantus insulin.  Pain  control with Lyrica 100 mg twice daily and oxycodone as needed.  Postoperative anemia 8.3 and monitored.  The patient was admitted for  comprehensive rehab program.   PAST MEDICAL HISTORY:  See discharge diagnoses.  No alcohol or tobacco.   ALLERGIES:  CONTRAST DYE.   SOCIAL HISTORY:  He lives with his cousin in [place].  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  Cousin works night shift.  Family  in area to check as needed.  One level home with a ramp.   FUNCTIONAL HISTORY:  Prior to admission, he was independent with a cane  and  driving.  Functional status upon admission to rehab services.  He  was minimal assist bed mobility, minimal assist transfers, minimal  assist to ambulate 40 feet with rolling walker, independent upper body  activities of daily living, minimal assist lower body.   Medications prior to admission were:  1. Apidra sliding scale insulin.  2. Levemir 50 units twice daily.  3. Aspirin 81 mg daily.  4. B12 daily.  5. Glucophage 500 mg twice daily.  6. Triamcinolone cream 0.1% as needed.  7. Potassium 20 mEq daily.  8. Plavix 75 mg daily.  9. Lyrica 100 mg twice daily.  10.Lasix 20 mg twice daily.  11.Metolazone 5 mg daily.  12.Hydralazine 50 mg 3 times daily.  13.Diovan 160 mg twice daily.  14.Coreg 25 mg twice daily.  15.Crestor 40 mg daily.  16.Alprazolam 0.25 mg as needed.   PHYSICAL EXAMINATION:  VITAL SIGNS:  Blood pressure 127/76, pulse 85,  temperature 99.8, respirations 20.  GENERAL:  This is an alert male in no acute distress, oriented x3.  EXTREMITIES:  Deep tendon reflexes hypoactive.  Sensation decreased to  light touch distally.  Left calves, cool without any swelling.  Chronic  skin changes noted.  Right below-knee amputation dressed, healing  nicely.  No signs of drainage.  LUNGS:  Clear to auscultation.  CARDIAC:  Regular rate and rhythm.  ABDOMEN:  Soft, nontender.  Good bowel sounds.   REHABILITATION HOSPITAL COURSE:  The patient was admitted to Inpatient  Rehab Services with therapies initiated on a 3-hour daily basis  consisting of physical therapy, occupational therapy, and 24-hour  rehabilitation nursing.  The following issues were addressed during the  patient's rehabilitation stay.  Pertaining to [name]'s right below-  knee amputation on October 30, 2008, secondary to severe tibial occlusive  disease, site was healing nicely.  Clips remained in place.  He would  follow up with Dr. [name] of Vascular Surgery in the next 2 weeks for  removal of clips.   Arrangements have been made to follow up with our  [facility].  He remained on subcutaneous  Lovenox throughout his rehab course for deep vein thrombosis  prophylaxis.  Pain management with good control on Lyrica 100 mg twice  daily as well as oxycodone as needed.  He did have a history of  nonischemic cardiomyopathy with single chamber defibrillator in 2005.  This was without issue.  He was followed by Dr. Lovena Le of Cardiology  Service.  His diabetes mellitus again was well controlled.  He continued  on his Glucophage 500 mg twice daily, and he would resume his Levemir 50  units twice daily at the time of discharge.  Blood pressure was  monitored with multiple antihypertensive medications.  No orthostatic  changes noted.  He remained on Lasix, Zaroxolyn, hydralazine, Benicar,  and Coreg.  He will continue his Crestor for history of hyperlipidemia.  He did have chronic anemia and was maintained  on iron supplement.  Chronic skin disorder, he was using his triamcinolone cream as needed.  The patient received weekly collaborative interdisciplinary team  conferences to discuss estimated length of stay, family teaching, and  any barriers to discharge.  He was overall modified independent for  bathing and dressing, supervision with rolling walker level for toilet  transfers, modified independent mobility transfers, modified independent  for wheelchair mobility, close supervision ambulating to 80 feet with  the rolling walker, close supervision for stairs.  He exhibited no  unsafe behavior.  He was continent of both bowel and bladder.   Latest labs showed a hemoglobin of 9.5, hematocrit 28.9, platelet  333,000.  Sodium 141, potassium 4.7, BUN 49, creatinine 1.40.   DISCHARGE MEDICATIONS:  At the time of dictation included:  1. Zaroxolyn 5 mg daily.  2. Hydralazine 50 mg 3 times daily.  3. Benicar 20 mg twice daily.  4. Coreg 25 mg twice daily.  5. Crestor 40 mg  daily.  6. Plavix 75 mg daily.  7. Glucophage 500 mg twice daily.  8. Vitamin B12 1000 mcg daily.  9. Aspirin 81 mg daily.  10.Potassium chloride 20 mEq daily.  11.Levemir 50 units twice daily.  12.Lyrica 100 mg twice daily.  13.Lasix 20 mg twice daily.  14.Ferrous sulfate 325 mg twice daily.  15.Oxycodone immediate release 5 mg 1 or 2 tablets every 4 hours as      needed for pain, dispensed with 90 tablets.   His diet was regular.  He was advised no smoking, no drinking, no  alcohol.  He  would follow up with Dr. [name] of Vascular Surgery in  approximately 2 weeks for removal of staples.  He was advised to call if  any increased redness, drainage, or fever.  He would follow up with Dr.  Alysia Penna at the [facility] appointment  was to be made.  Dr. Marjean Donna of [facility] for medical management.      Lauraine Rinne, P.A.      Charlett Blake, M.D.  Electronically Signed    DA/MEDQ  D:  11/13/2008  T:  11/13/2008  Job:  [id number]   cc:   Angus G. Everette Rank, MD  Champ Mungo. Lovena Le, MD  [name] [name], M.D.

## 2010-08-17 NOTE — H&P (Signed)
NAMEJAMAIN, COTTE NO.:  [id number]   MEDICAL RECORD NO.:  [id number]          PATIENT TYPE:  IPS   LOCATION:  [id number]                         FACILITY:  [facility]   PHYSICIAN:  Charlett Blake, M.D.DATE OF BIRTH:  31-Dec-1955   DATE OF ADMISSION:  11/05/2008  DATE OF DISCHARGE:                              HISTORY & PHYSICAL   REASON FOR ADMISSION:  Rehabilitation following right below-knee  amputation.   CHIEF COMPLAINT:  Problems with walking.   A 55 year old male with insulin-dependent diabetes mellitus as well as  nonischemic cardiomyopathy with an ejection fraction of 15-20%.  He has  undergone a previous implantable cardioverter-defibrillator per Dr.  Cristopher Peru.  He is admitted on October 30, 2008, with a gangrenous right  mid foot.  He had undergone a right first toe transmetatarsal amputation  on September 03, 2008 and had poor healing.  He was noted to have severe  tibial occlusive disease but not felt to be salvageable in terms of  vascular surgery.  He underwent right BKA on October 30, 2008 and was  placed on subcu Lovenox for DVT prophylaxis postoperatively.  Blood  sugars were maintained in an acceptable range with Lantus insulin.  He  was on Levemir and Apidra at home.  His pain control has been with  Lyrica 100 mg b.i.d. and p.r.n. oxycodone.  Postoperatively, his  hemoglobin fell to 8.3, was not transfused, and this has been monitored.  Physical Medicine Rehabilitation was consulted.  PT, OT, as well as PM&R  MD felt the patient be very good rehab candidate.   REVIEW OF SYSTEMS:  Denies any shortness of breath, cough, or sputum  production.  GI:  Denies any incontinence, nausea, vomiting, or  diarrhea.  GU: Denies any bladder problems.  For remainder of review of  systems, please see H and P form.   PAST MEDICAL HISTORY:  In addition to above, he has a history of  psoriasis since age 64, positive hypertension, positive hyperlipidemia,  and positive  anxiety.   FAMILY HISTORY:  Coronary artery disease and diabetes.   HABITS:  Negative ethanol, negative tobacco.   SOCIAL HISTORY:  He lives with cousin in [place].  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  His cousin works night.  Family in the area  can check on as needed.  He lives in a 1-level home with ramp entry.   FUNCTIONAL HISTORY:  Independent with a cane prior to as well as driving  prior to admission.   HOME MEDICATIONS:  1. Apidra subcu SSI.  2. Levemir Flex 50 units subcu b.i.d.  3. Aspirin 81 mg daily.  4. B12 daily.  5. Metformin 500 mg b.i.d.  6. Triamcinolone cream 0.1 p.r.n. for psoriasis.  7. KCL 20 mEq p.o. daily.  8. Plavix 75 mg p.o. daily.  9. Lyrica 100 mg p.o. b.i.d.  10.Lasix 20 mg p.o. b.i.d.  11.Metolazone 5 mg p.o. daily.  12.Hydralazine 50 mg p.o. t.i.d.  13.Diovan 160 mg p.o. b.i.d.  14.Coreg 25 mg p.o. b.i.d.  15.Crestor 40 mg p.o. daily.  16.Alprazolam 0.25 mg  p.o. p.r.n.   Last hemoglobin 8.3, last BUN 28, and last creatinine 1.19.  UA from  August 3; 0-2 wbc's, rare bacteria, negative nitrite.  CBGs; 186, 135,  and 160 over the last 24 hours.   PHYSICAL EXAMINATION:  VITAL SIGNS:  Blood pressure 127/76, pulse 85,  temperature 99.8, respirations 20, and O2 saturation 95% on room air.  GENERAL:  No acute distress.  HEENT:  Eyes; anicteric, noninjected.  External ENT; normal.  NECK:  Supple without adenopathy.  LUNGS:  Respiratory effort is good.  Lungs are clear, but does have some  decreased breath sounds on the right side compared to left side.  EXTREMITIES:  Without edema.  His right stump is mildly edematous.  There is 2 blisters on the distal aspect.  He has psoriasis over his  entire lower extremities with depigmentation, but no open areas.  Remainder of right lower extremity not assessed secondary recent BKA.  His surgical clips for the right BKA incision are intact.  No drainage  through the wound, no erythema.  ABDOMEN:   Positive bowel sounds.  Soft and nontender.  NEUROLOGIC:  Orientation x3.  Mood, memory, and affect are intact.  Sensation is reduced in the left foot.  Range of motion is full in  bilateral upper extremities and bilateral hips.   POST ADMISSION PHYSICIAN EVALUATION:  1. Functional deficits secondary to right BKA.  Postoperative day 6.      He has PVD, severe tibial occlusive disease on the right side.  2. The patient admitted to receive collaborative, interdisciplinary      care between the physiatrist, rehab nursing staff, and therapy      team.  3. The patient's level of medical complexity and substantial therapy      needs in context of the medical necessity cannot be provided at a      lesser intensive of care such as SNF.  4. The patient experienced substantial functional loss from his      baseline.  Upon functional assessment at the time of preadmission      screening, the patient was not yet assessed.  Upon functional      examination today, the patient was at a supervision level with      rails, head of bed elevated 20 degrees, min to mod assist with      transfers, min assist ambulation 15-35 feet with rolling walker,      min assist lower body dressing, set up upper body, and min assist      toilet transfers.  Judging by the patient's diagnosis, physical      exam, and functional history, the patient has potential for      functional progress which will result in measurable gains while on      inpatient rehab.  These gains will be of substantial and practical      use upon discharge to home in facilitating mobility, self-care, and      independence.  Interim changes in medical status since preadmission      screening are detailed in history of present illness.  5. Physiatrist will provide 24-hour management of medical needs as      well as oversight of therapy plan/treatment and provide guidance as      appropriate regarding interaction of the two.  6. A 24-hour rehab  nursing will assist in management of skin, bowel,      bladder, wound, and help integrate therapy concepts, techniques,  and education.  7. PT will assess and treat for upper and lower body strengthening,      gait training, transfers, equipment, and endurance.  Goals are for      a modified independent level with a rolling walker.  8. OT will assess and treat for ADLs, cognitive and perceptual, upper      body strengthening, range of motion, and equipment.  Goals are for      a modified independent with upper and lower body ADLs.  9. Case management and social worker will assess and treat for      psychosocial issues and discharge planning.  10.Team conference will be held weekly to assess the patient's      progress/goals and to determine barriers to discharge.  11.The patient has demonstrated sufficient medical stability and      exercise capacity to tolerate at least 3 hours of therapy per day      for at least 5 days per week.   Estimated length of stay is 5-7 days.  Prognosis for further functional  improvement is good.   MEDICAL PROBLEM LIST AND PLAN:  1. Right BKA.  We will do wound changes, wrap with Ace wrap, minimize      edema.  2. DVT prophylaxis.  Subcu Lovenox.  3. Pain control.  Lyrica and oxycodone.  4. Nonischemic cardiomyopathy.  We will monitor for signs of cardiac      decompensation such as peripheral edema and shortness of breath.  5. Insulin-dependent diabetes mellitus.  Lantus insulin as well as      Glucophage.  Monitor CBGs.  6. Hypertension.  Continue Lasix, Zaroxolyn, hydralazine, Benicar, and      Coreg.  7. Hyperlipidemia.  Continue Crestor.  8. Anemia.  We will add iron.  9. Anxiety.  Alprazolam.      Charlett Blake, M.D.  Electronically Signed     AEK/MEDQ  D:  11/05/2008  T:  11/06/2008  Job:  [id number]   cc:   Angus G. Everette Rank, MD  Champ Mungo. Lovena Le, MD  Dr. [name]

## 2010-08-17 NOTE — Assessment & Plan Note (Signed)
[facility] OFFICE NOTE   NAME:Castanon, NEPHI KINZEL                        MRN:          [id number]  DATE:07/16/2007                            DOB:          Feb 13, 1956    Mr. Longstreth returns today for followup.  He has a history of nonischemic  cardiomyopathy with an EF of 20-25%.   He has hard to control hypertension and poorly controlled diabetes.  He  is to see Dr. Everette Rank soon.  He continues to have problems with his  sugar.  I do not have recent hemoglobin A1c, but I am sure it above 7.  The patient also does not seem really compliant with his blood pressure  pills.  He had multiple pill bottles today which were empty.  He said he  was going to the pharmacy.   He complains of increasing lower extremity edema.  His weight is up  about 15 pounds.   I explained to Teren that he really needs to be compliant with his  medications.  We will add Zaroxolyn to his current Lasix.  He seems to  be taking his Lasix 3 times a day instead of twice a day.  I told him  hopefully the Zaroxolyn would make his diuretic work better.   REVIEW OF SYSTEMS:  Remarkable for no significant chest pain, no PND,  orthopnea, no dizziness, and no palpitations.  He has significant  psoriasis which seems to be holding its own.  Last time I saw him, he  had arthritis, and his prednisone is now off.   MEDICATIONS:  1. Metformin 1 g b.i.d.  His pill bottle was out.  2. Lasix 20 b.i.d.  3. Vitamin B12.  4. Plavix 75 daily.  5. Hydralazine 50 t.i.d.  6. Lyrica 100 b.i.d.  7. Amlodipine 10 daily.  8. Diovan 160 b.i.d.  9. Carvedilol 25 b.i.d.  10.Vytorin 10/40.  11.Aspirin 1 daily.  12.Potassium 20 daily.  13.Levemir 100 units sliding scale.  14.Apidra 100 units daily.   EXAM:  Remarkable for an overweight black male with a toothpick in his  mouth.  Affect is appropriate.  Weight has apparently gone from 257 in November 2008 to 272.   Blood  pressure is 160/80, pulse 70 and regular, respiratory rate 14, afebrile.  HEENT:  Unremarkable.  Carotids normal without bruit, no lymphadenopathy, JVP elevation.  LUNGS:  Clear, good diaphragmatic motion.  No wheezing.  S1-S2, distant heart sounds, PMI not palpable.  ABDOMEN:  Protuberant, bowel sounds positive.  No bruit, no tenderness,  no AAA, no hepatosplenomegaly, or hepatojugular reflux.  Distal pulses are intact with +1 edema bilaterally.  He has significant  psoriasis all over his legs and arms.   His EKG shows sinus rhythm with LVH.   IMPRESSION:  1. Cardiomyopathy.  Volume appears up.  Add Zaroxolyn 5 mg a day,      continue Lasix b.i.d., low-salt diet.  2. Hypertension, borderline, controlled adding diuretic, and we will      try to ensure compliance with his medications.  I suspect this is a  significant issue.  3. Diabetes.  Follow up with Dr. Everette Rank.  Hemoglobin A1c quarterly.      Encouraged him to take better control.  4. Hypercholesterolemia.  Continue Vytorin 10/40.  He has physical      with Dr. Everette Rank and will have his LFTs checked.  5. Lower extremity edema.  Elevate legs at the end of the day, low-      salt diet, adding Zaroxolyn.   At some point in the future, he may also be a candidate for Aldactone.   I will see him back in 6 months.     Wallis Bamberg. Johnsie Cancel, MD, [facility]  Electronically Signed    PCN/MedQ  DD: 07/16/2007  DT: 07/16/2007  Job #: [id number]

## 2010-08-17 NOTE — Assessment & Plan Note (Signed)
OFFICE VISIT   PARV, [name]  DOB:  01-Jan-1956                                       09/16/2008  [id number]   The patient had [name] right first toe transmetatarsal patient performed by  me on June 2 for gangrene of the right first toe with severe tibial  occlusive disease which is not reconstructable.  So far the amputation  appears to be healing satisfactorily.  The posterior flap is quite  healthy.  The anterior left is [name] little pale but not bad.  There is no  purulent drainage coming from the wound and the sutures are intact.  He  is having no fever or other symptoms of infection.  Dressing was changed  today and he will return in 2 weeks for further follow-up unless he  develops any purulent drainage from the wound in the interim.   [name] [name], M.D.  Electronically Signed   JDL/MEDQ  D:  09/16/2008  T:  09/17/2008  Job:  [id number]   cc:   [name]. Nils Pyle, M.D.

## 2010-08-17 NOTE — Assessment & Plan Note (Signed)
[facility] OFFICE NOTE   NAME:[name], [name] HILLERS                        MRN:          [id number]  DATE:02/09/2007                            DOB:          1956-01-17    [name] returns today for follow up. He has had a history of non-ischemic  cardiomyopathy with ejection fractions ranging from 10% to 25% plus 2  MR.   Back in 2004, he had a transient ischemic attack thought secondary to  hypertension with normal parotids.   He has an AICD in place. His coronary risk factors include previous  smoking, hyperlipidemia, and hypertension.   He is also a diabetic. Dr. Everette Rank follows his diabetes. It was out of  control a little bit recently. He got some steroids for arthritic type  pains and this ran his sugar up a little bit. In talking to the patient,  it appears that his blood pressure has been suboptimally controlled. He  guarantees me that he has been compliant with his medications. His diet  is fair. He has not had any significant chest pain, PND, or orthopnea.  There have been no palpitations or AICD discharges.   REVIEW OF SYSTEMS:  Otherwise negative.   CURRENT MEDICATIONS:  1. Lasix 20 mg b.i.d.  2. Diovan 160 mg b.i.d.  3. Norvasc 5 mg daily.  4. Potassium 20 daily.  5. Aspirin daily.  6. Coreg 25 mg b.i.d.  7. Plavix 75 mg daily.  8. Insulin.  9. Levemir.  10.Lyrica.  11.B12.  12.Metformin 500 mg t.i.d.  13.Vytorin 10/40.   PHYSICAL EXAMINATION:  GENERAL:  Remarkable for an overweight black male  in no distress.  VITAL SIGNS:  Blood pressure 160/100, pulse 60 and regular, weight 257,  respiratory rate 14.  HEENT:  Unremarkable.  NECK:  Carotids normal without bruit. No lymphadenopathy, thyromegaly,  or JVP elevation.  LUNGS:  Clear with diaphragmatic motion and no wheezing.  CARDIAC:  S1 and S2 with an S4 gallop. PMI is not palpable.  ABDOMEN:  Benign. Bowel sounds are positive. No  hepatosplenomegaly or  hepato jugular reflux. No AAA. No bruit.  EXTREMITIES:  Distal pulses are intact with no edema.  NEUROLOGIC:  Peripheral neuropathy below the ankles.  MUSCULOSKELETAL:  No muscular weakness.  SKIN:  Warm and dry.   IMPRESSION:  1. Cardiomyopathy that is currently stable. No congestive symptoms.      Continue current medications including after load reduction with      Diovan.  2. Mitral insufficiency. Murmur is not apparent on exam. Probably      secondary to annular dilatation. Continue current dose of diuretic      of Lasix 20 mg b.i.d.  3. Hypertension, poorly controlled. The patient says that he has been      compliant. Particularly in light of his decreased LV function, we      need to do a better job. BiDil will be sort of expensive for      patient. For the time being, I will place him on hydralazine 50 mg      t.i.d. and  see him back in three months.  4. Diabetes. Follow up with Dr. Everette Rank, hemoglobin a1C quarterly. The      patient seems to think that his blood pressures have been running      fine.  5. AICD implantation. Follow with Dr. Lovena Le. No discharges.      Functioning normally.  6. Previous TIA, no evidence of carotid disease, no residual bruit.      Continue aspirin and Plavix.   Overall, I think that the patient is doing well outside of his blood  pressure and I will see him back to see how he responds to the  hydralazine.     Wallis Bamberg. Johnsie Cancel, MD, [facility]  Electronically Signed    PCN/MedQ  DD: 02/09/2007  DT: 02/10/2007  Job #: [id number]

## 2010-08-17 NOTE — Op Note (Signed)
[name], [name]   MEDICAL RECORD NO.:  [id number]          PATIENT TYPE:  INP   LOCATION:  2005                         FACILITY:  [facility]   PHYSICIAN:  [name]. [name], M.D.  DATE OF BIRTH:  10-01-1955   DATE OF PROCEDURE:  10/30/2008  DATE OF DISCHARGE:                               OPERATIVE REPORT   PREOPERATIVE DIAGNOSIS:  Severe tibial occlusive disease with nonhealing  right first toe amputation site.   POSTOPERATIVE DIAGNOSIS:  Severe tibial occlusive disease with  nonhealing right first toe amputation site.   OPERATION:  Right below-knee amputation.   SURGEON:  [name]. [name], MD   FIRST ASSISTANT:  [place], [place]   ANESTHESIA:  General endotracheal.   PROCEDURE IN DETAIL:  The patient was taken to the operating room and  placed in the supine position at which time satisfactory general  endotracheal anesthesia was administered.  The right lower extremity was  prepped with Betadine scrub and solution and draped in the routine  sterile manner isolating the foot and distal leg in an impervious  stockinette.  Skin flaps for below-knee amputation were then marked  basing the tibia and fibula about 4-5 inches distal to the knee joint.  Incision was carried down through skin, subcutaneous tissue, and fascia  with the scalpel.  The skin had diffuse changes secondary to psoriasis  and was not normal in appearance, somewhat fragile but the subcutaneous  tissue was a good texture.  The tibia and fibula were then cleaned  proximally with periosteal elevator.  The arteries, nerves, and veins  were individually ligated with 2-0 silk ties and ligatures.  The tibia  and fibula were then divided using a Stryker saw beveling the tibia  anteriorly and smoothing with a rasp.  Posterior muscles were divided  with the Bovie and the specimen removed from the table.  Adequate  hemostasis was achieved following thorough irrigation.  Hemovac  drain  brought out medially and laterally.  Fascia closed over the bone with  interrupted 2-0 Vicryl.  Skin closed in a subcuticular fashion with 2-0  Vicryl and skin clips.  Sterile dressing applied including a knee  immobilizer.  Hemovac drains were brought out medially and laterally  prior to closure and secured with silk sutures.  The patient tolerated  the procedure well and taken to the recovery in satisfactory condition.      [name] [name], M.D.  Electronically Signed     JDL/MEDQ  D:  10/30/2008  T:  10/30/2008  Job:  [id number]

## 2010-08-17 NOTE — Assessment & Plan Note (Signed)
[facility] OFFICE NOTE   NAME:[name] [name] GREW                        MRN:          [id number]  DATE:09/04/2007                            DOB:          [birth year]    [name] [name] returns today for followup.  He is a very pleasant middle-age  male with a nonischemic cardiomyopathy, Class II congestive heart  failure, has a history of diabetes and hypertension and morbid obesity.  He is status post insertion of a Guidant Vitality single-chamber  defibrillator.  He returns today for followup.  He had no chest pain.  He has chronic peripheral edema and Class II heart failure symptoms.   CURRENT MEDICATIONS:  1. Metformin 1 gram twice daily.  2. Multiple vitamins.  3. Plavix 75 a day.  4. Hydralazine 500 mg three times daily.  5. Lyrica 100 mg twice daily.  6. Amlodipine 10 mg daily.  7. Diovan 160 twice daily.  8. Carvedilol. 25 twice daily.  9. Vytorin 10/40 daily.  10.Aspirin 81 mg daily.  11.Potassium 20 mEq daily.  12.Furosemide 20 twice daily.  13.He also takes metolazone 5 mg daily.   PHYSICAL EXAMINATION:  GENERAL:  He is a pleasant middle-age man in no  acute distress.  VITAL SIGNS:  Blood pressure was 142/80, the pulse 86 and regular,  respirations 18. The weight was 265 pounds.  NECK:  Revealed no jugular venous distention.  LUNGS:  Clear bilaterally to auscultation.  No wheezes, rales or rhonchi  are present.  CARDIOVASCULAR:  Exam reveals a regular rate and rhythm.  Normal S1-S2.  There were no murmurs, rubs, or gallops.  EXTREMITIES:  Demonstrated trace peripheral edema bilaterally.   Interrogation of his defibrillator demonstrates a Guidant Vitality. The  R waves 23. The impedance 524, the threshold 0.6 at 0.5. The battery  voltage was 2.66 volts.  There were no intercurrent ICD therapies.   IMPRESSION:  1. Nonischemic cardiomyopathy.  2. Congestive heart failure, Class II  3. Status post  implantable cardioverter-defibrillator insertion.  4. Hypertension.  5. Diabetes.   DISCUSSION:  [name] [name] is stable.  He is mostly euvolemic at the present  time. His heart failure is Class II. His defibrillator is working  normally.  We will see him back for followup in 1 year.     [name] [name]. [name] Le, MD  Electronically Signed    GWT/MedQ  DD: 09/04/2007  DT: 09/04/2007  Job #: [id number]

## 2010-08-17 NOTE — Assessment & Plan Note (Signed)
OFFICE VISIT   [name], [name]  DOB:  07-13-1955                                       12/23/2008  [id number]   The patient's right below knee amputation stump is not completely healed  yet.  There is [name] small eschar in the mid portion measuring about 3 mm x  12 mm.  He has no purulent drainage.  I asked him to begin some moist  gauze on this to help separate the eschar which is not separated at this  point.  He has had no chills or fever.  Blood pressure today is 207/118,  heart rate is 100.  He is not yet using the shrinker for the stump.  He  will continue wound care and return to see [place] in 5 weeks.  He will see  Dr. Everette Rank later today because of his elevated blood pressure.   [name] [name], M.D.  Electronically Signed   JDL/MEDQ  D:  12/23/2008  T:  12/24/2008  Job:  [id number]

## 2010-08-17 NOTE — Assessment & Plan Note (Signed)
OFFICE VISIT   ZAMERE, PFEUFFER A  DOB:  February 18, 1956                                       01/27/2009  [id number]   The patient returns today for further examination of his right below  knee amputation stump.  I performed the surgery July 29 of this year.  He did have a small area which had not healed in the mid portion of his  below knee stump but today that has healed.  There is a very small  abrasion over the patella measuring about a centimeter in diameter which  is not deep, he said due to a tape injury.  He has been released from  the wound center by Dr. Nils Pyle.  The prosthetist is present with him  today and I do believe it will be okay to proceed with the prosthesis in  2 weeks to give this a little more time to be certain that it is  completely healed.  He will return to see [place] on a p.r.n. basis.   [name] [name], M.D.  Electronically Signed   JDL/MEDQ  D:  01/27/2009  T:  01/28/2009  Job:  [id number]

## 2010-08-17 NOTE — Assessment & Plan Note (Signed)
OFFICE VISIT   [name], [name] A  DOB:  Jul 29, 1955                                       12/02/2008  [id number]   The patient had a right below knee amputation performed by me on July 29  for severe tibial occlusive disease with a nonhealing toe amputation  site.  He has severe psoriasis and we were concerned about healing of  his below knee amputation but he did have an excellent popliteal pulse.  The amputation initially healed well but he fell on the hospital on the  rehab floor and traumatized the suture line.  Today the suture line is  fairly well-healed with the exception of one portion in the middle which  has skin separation right over the tibia although I cannot see any  exposed bone.  This is about 15 mm x 4 mm in size.  There is no evidence  of any infection or purulent drainage.  Skin clips were removed today  having been over 4 weeks.  He was instructed to continue to clean this  and dress it each day and keep it wrapped with an Ace, avoid trauma and  return to see me in 3 weeks.  Continue with home health nurse twice  weekly.   [name] [name], M.D.  Electronically Signed   JDL/MEDQ  D:  12/02/2008  T:  12/03/2008  Job:  [id number]

## 2010-08-17 NOTE — Assessment & Plan Note (Signed)
OFFICE VISIT   [name], [name]  DOB:  January 12, 1956                                       10/14/2008  [id number]   The patient returns today for further evaluation of his right first  transmetatarsal toe amputation.  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  There was no evidence of  any granulation tissues.  He has no cellulitis or surrounding infection  but it is clear that this will not heal.  He does have [name] 2 to 3+  popliteal pulse palpable and I believe normally would heal [name] below knee  amputation but does have some psoriasis with significant changes in the  skin with thinning of the skin anteriorly over the tibia which may well  affect wound healing of the below knee amputation.  I have discussed  this with him and he would like to try [name] below knee amputation but he is  not sure when he wants to do that.  He will be back in touch with [place]  when he is ready to pursue [name] right below knee amputation.   [name] [name], M.D.  Electronically Signed   JDL/MEDQ  D:  10/14/2008  T:  10/15/2008  Job:  [id number]   cc:   [name]. Nils Pyle, M.D.  Angus G. Everette Rank, MD

## 2010-08-17 NOTE — Assessment & Plan Note (Signed)
OFFICE VISIT   [name], [name] A  DOB:  1956/01/22                                       08/18/2008  [id number]   [name] was referred back to see me today by Dr. Nils Pyle at the wound  center.  He has discontinued hyperbaric oxygen treatments and thinks it  is time to proceed with an amputation of some type in the right lower  extremity.  He has had angiography which reveals a patent superficial  femoral popliteal artery with severe tibial disease with reconstitution  of a nonreconstructable peroneal artery distally.  He is not a candidate  for bypass.  The right first toe has a gangrenous tip, but no  cellulitis, infection or ulceration proximally.   I suggested a possible right first toe amputation, reviewed, the patient  would like to try this.  He probably has no more than a 50/50 chance  this will heal and it likely will not heal, but healing as a  possibility.  He may want to proceed with a right below-knee amputation.  He is not ready to make that decision, will think about it and will call  [name] to schedule that in the near future, either as an outpatient for  first toe amputation or as an inpatient  For below-the-knee amputation.  Blood pressure today is 154/89, heart  rate 108, respirations 14 otherwise unchanged.   [name] [name], M.D.  Electronically Signed   JDL/MEDQ  D:  08/18/2008  T:  08/19/2008  Job:  [id number]

## 2010-08-17 NOTE — Consult Note (Signed)
VASCULAR SURGERY CONSULTATION   Verhoeven, CLOUD A  DOB:  [birth year]                                       06/17/2008  [id number]   The patient is a 55 year old male patient who has had an ulcer adjacent  to his right first toenail for the past 6 months, which has failed to  heal.  He has been seen in the [facility] recently and evaluated by  Dr. Nils Pyle, and found to have tibial occlusive disease by Doppler  studies.  He has had local wound care, but continues to have an ulcer in  the tip of the right first toe.  He has severe pain after walking about  a block in both lower extremities.  He does not ambulate long distances.  This is his first nonhealing ulcer.   PAST MEDICAL HISTORY:  1. Insulin dependent diabetes mellitus.  2. Hypertension.  3. Hyperlipidemia.  4. History of atrial fibrillation with implantable defibrillator      placed in 2005.  5. Mild left brain stroke.  6. Negative for myocardial infarction or coronary artery disease,      COPD.   FAMILY HISTORY:  Positive for stroke and diabetes in his father.  Stroke  in his brother.  Negative for coronary artery disease.   SOCIAL HISTORY:  The patient has smoked a pack of cigarettes a day for  30+ years.  Has not smoked since 2005.  Does not use alcohol.   REVIEW OF SYSTEMS:  Positive for a history of atrial fibrillation,  decreased hearing, discomfort in his legs with ambulation, and skin  changes of psoriasis, which he has had for many years.   ALLERGIES:  Kidney dye.  States he developed a rash many years ago in  the 1970s.  Has not had dye since.   MEDICATIONS:  Include aspirin and Plavix.   PHYSICAL EXAM:  Blood pressure 183/108, heart rate is 80, respirations  18.  Generally, he is an obese middle-aged male in no apparent distress.  Alert and oriented x3.  Neck is supple with 3+ carotid pulses palpable.  No bruits are audible.  Neurologic exam is normal.  No palpable  adenopathy in  the neck.  Chest is clear to auscultation.  Cardiovascular  exam reveals a regular rhythm with no murmurs.  Abdomen is soft and  nontender with no masses.  He has an implantable defibrillator in the  left infraclavicular area.  The abdomen is soft and nontender with no  masses.  He has femoral and popliteal pulses, which are 2+ bilaterally.  Both legs have a diffuse psoriatic rash, which is chronic.  No palpable  pulses in the feet bilaterally.  He has a circular nonhealing ulcer at  the tip of the right first toe, measuring about 5 mm in diameter with no  purulence or cellulitis.  No ulcers on the left foot.   IMPRESSION:  Nonhealing ulcer right toe with diabetes mellitus and  tibial occlusive disease.   PLAN:  To obtain an angiogram on March 23 by Dr. Trula Slade to see if the  patient is a candidate for any intervention percutaneously, or to see if  he is a candidate for a bypass.  This ulcer has been present for 6  months without healing.  The patient does have a remote history of  contrast allergy 30+ years ago,  but has had no recent reactions.   [name] [name], M.D.  Electronically Signed  JDL/MEDQ  D:  06/17/2008  T:  06/18/2008  Job:  [id number]   cc:   Joneen Boers A. Nils Pyle, M.D.

## 2010-08-17 NOTE — Assessment & Plan Note (Signed)
[facility] OFFICE NOTE   NAME:[name], [name]                        MRN:          [id number]  DATE:02/02/2007                            DOB:          August 01, 1955    DATE OF CLINIC VISIT:  February 02, 2007.   [name] returns today for followup.  He is a very pleasant, middle-  aged male with a nonischemic cardiomyopathy and congestive heart failure  status post ICD implantation.  He returns today for followup.  The  patient denies chest pain or shortness or breath peripheral edema.  He  does note that his blood pressure is sometimes elevated.   MEDICATIONS:  1. Furosemide 20 twice a day.  2. Diovan 160 twice a day.  3. Norvasc 5 a day.  4. Potassium 20 mEq daily.  5. Aspirin 81 a day.  6. Coreg 25 twice a day.  7. Plavix 75 a day.  8. Multivitamins.   PHYSICAL EXAMINATION:  GENERAL:  He is a pleasant, well-appearing man in  no distress.  VITAL SIGNS:  The blood pressure was 154/94, the pulse was 77 and  regular, the respirations were 18, and the weight was 257 pounds.  NECK:  No jugulovenous distention.  LUNGS:  Clear bilaterally to auscultation.  No wheezes, rales, or  rhonchi were present.  CARDIOVASCULAR:  Regular rate and rhythm with normal S1 and S2.  EXTREMITIES:  No cyanosis, clubbing, or edema.  He demonstrated trace  peripheral edema bilaterally.   EKG demonstrates normal sinus rhythm with LVH and repolarization  abnormality.   Interrogation of his defibrillator demonstrates a Guidant Vitality T-  135.  The R-waves are 22, the impedance 512, and the threshold was 0.6  at 0.5.  Battery voltage was 2.88 V (__________of 2).  There were no  intercurrent ICD therapies.   IMPRESSION:  1. Nonischemic cardiomyopathy.  2. Congestive heart failure.  3. Hypertension.  4. Obesity.   DISCUSSION:  Overall, [name] is stable, and his defibrillator is  working normally.  We will plan to see him  back in ICD followup in three  to four months.     Champ Mungo. Lovena Le, MD  Electronically Signed    GWT/MedQ  DD: 02/02/2007  DT: 02/03/2007  Job #: [id number]   cc:   Angus G. Everette Rank, MD

## 2010-08-20 NOTE — H&P (Signed)
NAME:  [name], [name] NO.:  [id number]   MEDICAL RECORD NO.:  [id number]                   PATIENT TYPE:  EMS   LOCATION:  ED                                   FACILITY:  [facility]   PHYSICIAN:  Naomie Dean, M.D.                  DATE OF BIRTH:  30-Jul-1955   DATE OF ADMISSION:  06/24/2002  DATE OF DISCHARGE:                                HISTORY & PHYSICAL   PRIMARY. M.D.:  Dr. Everette Rank.   CHIEF COMPLAINT:  I have weakness on my left side.   HISTORY OF PRESENT ILLNESS:  The patient is a 55 year old man with a history  of hypertension and diabetes.  He was well until this afternoon when he woke  up with numbness on his left side.  He also subsequently developed total  weakness on his left side.  He had to drag his feet to walk.  He, therefore,  came to the emergency room for evaluation and treatment.  While being  evaluated in the emergency room the patient's symptoms gradually resolved.  On direct questioning he admits to a similar episode about 1-1/2 months ago,  but he did not seek any medical attention.   REVIEW OF SYSTEMS:  He denies any fever.  Denies any weakness.  RESPIRATORY:  He admits to shortness of breath early on which has since resolved.  Denies  any cough.  CARDIOVASCULAR:  Denies any chest pain or palpitations.  CENTRAL  NERVOUS SYSTEM:  He denies any lightheadedness or dizziness.  EXTREMITIES:  He denies any edema.  MUSCULOSKELETAL:  He has no joint pains.  All other  systems reviewed and were negative.   PAST MEDICAL HISTORY:  1. History of hypertension.  2. Diabetes.  3. Hyperlipidemia.  4. Chronic skin disorder.   CURRENT MEDICATIONS:  1. Glucophage 500 b.i.d.  2. Altace 5 mg daily.  3. Prandin 2 mg daily.  4. Zocor 80 daily.  5. __________  500 daily.   ALLERGIES:  He is allergic to CONTRAST DYE.   SOCIAL HISTORY:  He is separated.  He has one child.  He smokes cigars.  He  drinks alcohol occasionally.   PHYSICAL  EXAMINATION:  VITAL SIGNS:  BP 179/90, heart rate is 87,  temperature is 98.2.  GENERAL:  Middle-aged man lying comfortably on the stretcher.  Not in any  apparent distress.  HEENT:  Pupils are equal and reactive to light and accommodation.  He is not  pale.  Oral mucosa is moist.  He is anicteric.  NECK:  Supple.  There is no jugular venous distention.  No lymphadenopathy.  No thyromegaly.  CHEST:  Air entry is good bilaterally.  Breath sounds are vesicular.  No  crackles were heard.  No wheezes were heard.  CARDIOVASCULAR:  Heart sounds 1 and 2 are normal.  No S3 or S4.  No gallops.  No murmurs  were appreciated.  ABDOMEN:  Soft and nontender.  Bowel sounds are present.  CNS:  He is alert and oriented x3.  He has a normal  __________  speech.  Cranial nerves II-XII are intact.  He has power of 5/5 in all extremities.  Tone is normal in all extremities.  Reflexes are normal.  Sensation is  intact.  Gait is not tested at this time.  EXTREMITIES:  He has no edema.  No clubbing or cyanosis.  SKIN:  He has multiple discoid lesions all over the skin of his arms and  legs.   LABORATORIES AND DIAGNOSTICS:  An EKG shows a normal sinus rhythm at 87  beats per minute.  Normal electrical axis.  Normal intervals.  He has left  ventricular hypertrophy.  He has T-wave inversion in V3 to V6.   His liver function tests are normal.  Troponin is 0.02, CK is 204, MB  fraction is 3.5.  PTT is 27, INR is 1, PT is 13.2.  The d-dimer is 0.22.  Sodium is 141, potassium is 3.7, BUN of 13, creatinine of 0.9, glucose is  181, calcium is 9.1.  WBC 6.7, hemoglobin is 14.8, MCV is 88.1, platelets  are 188, neutrophil count is 74%.   A preliminary CT scan report from the ED physician is negative for any acute  bleed or mass.   ASSESSMENT AND PLAN:  1. Transient ischemic attack:  The patient will be admitted to telemetry to     rule out any arrhythmia.  The patient will be given aspirin 325 and     Plavix 75  daily.  I will schedule him for a Doppler of his carotids and     also get a 2-D echo done.  Check a homocystine level, and give him folate     and vitamin B6 if homocystine level is elevated.  2. Diabetes mellitus:  The patient will be continued on his Glucophage 500     mg b.i.d.  Prandin 2 mg daily.  His hemoglobin A1C level will be checked.     He will also be put on Accu-Chek with Regular Insulin coverage.  He will     be maintained on a diabetic diet.  3. Hypertension, which is uncontrolled at this time:  The patient will be     given a stat dose of metoprolol and hydrochlorothiazide.  He will also     continue on his Altace 5 mg daily.  Monitor the BP on this regimen and     adjust his medications accordingly.  He will also be put on a 2 g sodium     diet.  4. Hyperlipidemia:  Check the patient's lipid profile at times.  The patient     will be continued on his Zocor 80 mg and __________  500 mg.   COMMENTS:  Patient to be admitted under the care of primary M.D., Dr.  Everette Rank.  Further workup and management will depend on clinical course.  Coordination of care is about 60 minutes.                                               Naomie Dean, M.D.    DW/MEDQ  D:  06/24/2002  T:  06/24/2002  Job:  [id number]

## 2010-08-20 NOTE — Op Note (Signed)
NAME:  [name], [name] NO.:  [id number]   MEDICAL RECORD NO.:  [id number]                   PATIENT TYPE:  OIB   LOCATION:  2899                                 FACILITY:  [facility]   PHYSICIAN:  Champ Mungo. Lovena Le, M.D.               DATE OF BIRTH:  1955-12-08   DATE OF PROCEDURE:  11/12/2003  DATE OF DISCHARGE:                                 OPERATIVE REPORT   PROCEDURE PERFORMED:  Insertion of a single-chamber defibrillator.   INDICATIONS:  Nonischemic cardiomyopathy with class II heart failure and an  ejection fraction of 15% over the course of several years.   INTRODUCTION:  The patient is a 55 year old man with diabetes and  hypertension, who has a history of a nonischemic cardiomyopathy and an  ejection fraction of 15-20%.  He has no coronary disease.  The patient has  had class II heart failure despite maximal medical therapy and severe LV  dysfunction for several years.  He has had these diagnoses since March 2004  and is now referred for single-chamber defibrillator insertion.   PROCEDURE:  After informed consent was obtained, the patient was taken to  the diagnostic EP lab in the fasted state.  After the usual preparation and  draping, intravenous fentanyl and midazolam were given for sedation.  Lidocaine 30 mL was infiltrated into the left infraclavicular region.  A 9  cm incision was carried out over this region and electrocautery utilized to  dissect down to the fascial plane.  The left subclavian vein was punctured  and the defibrillation lead, model 0158, serial number [id number], was advanced  into the left subclavian vein and on into the right ventricle.  Mapping was  carried out in the right ventricle and at the final site, the R-waves  measured 9 mV and the pacing threshold was 0.9 V at 0.5 msec with a pacing  impedance of 807 Ohms.  With the defibrillation lead in satisfactory  position, it was secured to the subpectoralis fascia with a  figure-of-eight  silk suture.  The sewing sleeve was also secured with silk suture.  Ten-volt  pacing did not stimulate the diaphragm once the lead was actively fixed.  A  subcutaneous pocket was made with electrocautery.  Of note, the lead was  secured with a figure-of-eight silk suture and the sewing sleeve was also  secured with silk suture.  Kanamycin irrigation was utilized to irrigate the  pocket and electrocautery utilized to assure hemostasis.  The Guidant  Vitality VR model T135 single-chamber defibrillator, serial number [id number],  was connected to the defibrillation lead and placed in the subcutaneous  pocket.  The generator was secured with silk suture.  Defibrillation  threshold testing was carried out.   After the patient was more deeply sedated with fentanyl and Versed,  defibrillation threshold testing was carried out.  VF was induced with a T-  wave shock and  a 14-joule shock was then delivered, terminating ventricular  fibrillation and restoring sinus rhythm.  Five minutes was allowed to elapse  and a second DFT test carried out.  This time VF was again induced with a T-  wave shock and an 11-joule shock was then delivered, which terminated  ventricular fibrillation and restored sinus rhythm.  At this point with  acceptable defibrillation threshold, the incision was closed with a layer of  2-0 Vicryl, followed by a layer of 3-0 Vicryl, followed by a layer of 4-0  Vicryl.  Benzoin was painted on the skin and Steri-Strips were applied and a  pressure dressing placed and the patient returned to his room in  satisfactory condition.   COMPLICATIONS:  There were no immediate procedural complications.   RESULTS:  This demonstrates successful implantation of a Guidant single-  chamber defibrillator in a patient with a nonischemic cardiomyopathy, class  II heart failure, and an ejection fraction of 15-20%.                                               Champ Mungo. Lovena Le,  M.D.    GWT/MEDQ  D:  11/12/2003  T:  11/12/2003  Job:  [id number]   cc:   Scarlett Presto, M.D.  Fax: [phone]   Angus G. Everette Rank, M.D.  [address]  [place]  [place] [address]  Fax: [phone]

## 2010-08-20 NOTE — Procedures (Signed)
   NAME:  [name], [name] NO.:  [id number]   MEDICAL RECORD NO.:  [id number]                   PATIENT TYPE:  INP   LOCATION:  A224                                 FACILITY:  [facility]   PHYSICIAN:  Scarlett Presto, M.D.                DATE OF BIRTH:  03/05/1956   DATE OF PROCEDURE:  06/25/2002  DATE OF DISCHARGE:                                  ECHOCARDIOGRAM   REFERRING PHYSICIAN:  Naomie Dean, M.D.   TAPE SETTINGS:  Tape number [id number], tape count [id number].   INDICATIONS FOR PROCEDURE:  This is a 55 year old male with hypertension and  diabetes who presents with a TIA.  Technical quality of the study was poor.   M-MODE MEASUREMENTS:  1. The aorta is 37 mm.  2. The left atrium is 52 mm, which is enlarged.  3. The septum is 11 mm.  4. The posterior wall is 13 mm, which is enlarged.  5. The LV diastolic dimension is 66 mm, which is enlarged.  The left     ventricular systolic dimension is 59 mm, which is enlarged.   2-D AND DOPPLER IMAGING:  1. The left ventricle is severely enlarged and left ventricular ejection     fraction is severely depressed.  Overall systolic function is estimated     at 123XX123 with diastolic function not being assessed.  There is anterior     septal akinesis from the mid ventricle to the apex.  There is inferior     septal hypokinesis and anterolateral and lateral severe hypokinesis with     the inferior wall appearing to move normally.  2. The right ventricle is normal size with normal systolic function.  3. Both atria appear enlarged.  There is no obvious atrial septal defect.  4. The aortic valve is mildly sclerotic with no stenosis or regurgitation.  5. The mitral valve appears to have mild anular dilatation.  There is trace     to mild insufficiency; no stenosis is seen.  6. The tricuspid valve is morphologically unremarkable with trace     insufficiency and no stenosis is seen.  7. The pulmonic valve is not well seen.  8. The pericardial structures were not seen well, as there are no subcostal     views.  The ascending aorta was not well seen.                                               Scarlett Presto, M.D.    JH/MEDQ  D:  06/25/2002  T:  06/26/2002  Job:  [id number]

## 2010-08-20 NOTE — Group Therapy Note (Signed)
   NAME:  [name], [name]                           ACCOUNT NO.:  [id number]   MEDICAL RECORD NO.:  [id number]                   PATIENT TYPE:  INP   LOCATION:  A224                                 FACILITY:  [facility]   PHYSICIAN:  Angus G. Everette Rank, M.D.              DATE OF BIRTH:  09-18-1955   DATE OF PROCEDURE:  DATE OF DISCHARGE:                                   PROGRESS NOTE   SUBJECTIVE:  This patient was admitted with what was felt to be a TIA.  The  patient is a diabetic.  He apparently had a weakness of his left side and  difficulty walking.   OBJECTIVE:  VITAL SIGNS: Blood pressure 149/86, respirations 20, pulse 85,  temperature 97.1.  HEART: Regular rhythm.  LUNGS: Clear to P&A.  ABDOMEN: Without organomegaly or masses.   ASSESSMENT:  The patient is a diabetic and was admitted with what appears to  be a transient ischemic attack or impending cerebrovascular accident.  A CT  of the head showed no acute hemorrhage or infarction.   PLAN:  Continue current regimen.  The patient is scheduled for an  echocardiogram.                                               Angus G. Everette Rank, M.D.    AGM/MEDQ  D:  06/25/2002  T:  06/25/2002  Job:  [id number]

## 2010-08-20 NOTE — Discharge Summary (Signed)
NAME:  [name], [name]                           ACCOUNT NO.:  [id number]   MEDICAL RECORD NO.:  [id number]                   PATIENT TYPE:  INP   LOCATION:  A224                                 FACILITY:  [facility]   PHYSICIAN:  Angus G. Everette Rank, M.D.              DATE OF BIRTH:  [birth year]   DATE OF ADMISSION:  06/24/2002  DATE OF DISCHARGE:  06/26/2002                                 DISCHARGE SUMMARY   DIAGNOSES:  1. Transient ischemic attack.  2. Non-insulin-dependent diabetes.  3. Hypertension.  4. Hyperlipidemia.   CONDITION:  Stable and improved at time of the discharge.   HISTORY:  A 55 year old male with a history of hypertension and diabetes who  was well until the day of admission when he noted numbness on his left side  and also total weakness on his left side and had to drag his foot when  walking.  He was seen in the emergency room for evaluation and treatment.  Patient was subsequently admitted with what was felt to be a TIA or  impending CVA.  A CT of his head without contrast was done on admission,  which was normal.   PHYSICAL EXAMINATION:  GENERAL/VITAL SIGNS:  Alert male with BP 179/90,  heart rate 87, temp 98.2.  HEENT:  Eyes, PERRLA.  TMs negative.  Oropharynx benign.  NECK:  Supple.  No JVD or thyroid nodules.  HEART:  Regular rhythm.  No murmurs.  ABDOMEN:  No palpable organs or masses.  NEUROLOGICAL:  Cranial nerves intact.  Reflexes normal.  Patient had no  obvious motor weakness.   LABORATORY DATA:  WBC 6700, hemoglobin 14.8, hematocrit 43.4.  Chemistries,  sodium 141, potassium 3.7, chloride 105, CO2 of 30, glucose 181, BUN of 13,  creatinine 0.9, calcium 9.1, total protein 6.6, albumin 3.8.  Liver enzymes,  SGOT 17, SGPT 23, alkaline phosphatase 75, bilirubin 0.6.  CPK on admission  212, CK-MB 3.5, relative index 1.7, troponin 0.02; subsequent CPK 239, CPK-  MB 3, relative index 1.3, troponin 0.02.   X-rays, CT of the head without contrast, normal.   PA and laterals of chest,  mild cardiomegaly, mild interstitial accentuation, thought to be secondary  to bronchitis or mild interstitial edema.   Carotid duplex ultrasound, no tight formation or luminal stenosis.   Electrocardiogram, normal sinus rhythm, left ventricular hypertrophy with ST-  and T-wave abnormalities.   HOSPITAL COURSE:  The patient was admitted.  Vital signs were monitored.  He  was placed on aspirin 325 mg daily, Plavix 75 mg IV.  He was continued on  Zocor 8 mg IV, Altace 5 mg IV, Niacin 500 mg daily, metoprolol 25 mg b.i.d.,  Glucophage 500 mg b.i.d.  Patient was placed on a sliding scale of regular  insulin.  Patient remained stable throughout his hospital stay.  He had a  carotid Doppler ultrasound, which was normal.  Echocardiogram was  ordered.  Patient improved and was able to be discharged after two days of  hospitalization.   DISCHARGE MEDICATIONS:  1. Plavix 75 mg daily  2. Aspirin 325 mg daily  3. Glucophage 500 mg b.i.d.  4. Altace 5 mg daily  5. Prandin 2 mg with each meal  6. Zocor 80 mg daily.                                               Angus G. Everette Rank, M.D.    AGM/MEDQ  D:  07/19/2002  T:  07/19/2002  Job:  [id number]

## 2010-08-20 NOTE — Discharge Summary (Signed)
NAME:  [name], [name]                           ACCOUNT NO.:  [id number]   MEDICAL RECORD NO.:  [id number]                   PATIENT TYPE:  INP   LOCATION:  3739                                 FACILITY:  [facility]   PHYSICIAN:  Champ Mungo. Lovena Le, M.D.               DATE OF BIRTH:  [birth year]   DATE OF ADMISSION:  11/12/2003  DATE OF DISCHARGE:  11/13/2003                                 DISCHARGE SUMMARY   DISCHARGE DIAGNOSES:  1.  Nonischemic cardiomyopathy with ejection fraction of 15% to 20%.  2.  Class II congestive heart failure despite maximal medical therapy.  3.  Status post Guidant Vitality VR model T135, implanted November 12, 2003,      by Champ Mungo. Lovena Le, M.D.   SECONDARY DIAGNOSES:  1.  Diabetes.  2.  Hypertension.  3.  Severe left ventricular dysfunction.   PROCEDURE:  November 12, 2003, implantation of Guidant Vitality VR model T135  single-chamber defibrillator.  There were no post-procedural complications.   Patient discharged on November 13, 2003, with the following medications:  1.  Diovan 160 mg twice daily.  2.  Norvasc 2.5 mg daily.  3.  Lasix 20 mg twice daily.  4.  Coreg 25 mg twice daily.  5.  Aldactone 25 mg daily.  6.  Zocor 80 mg daily at bedtime.  7.  Potassium chloride 20 mEq daily.  8.  Aspirin 81 mg daily.  9.  Plavix 75 mg daily, to be restarted after 48 hours.  10. Glucophage 1000 mg in the morning, 500 mg in the evening, starting on      November 14, 2003.  11. For pain, Tylenol 325 mg, 1-2 tabs every 4-6 hours as needed for pain.   The patient has been given an activity, discussed activity and mobilization  of the left upper extremity post procedure.   DISCHARGE DIET:  Low-sodium, low-cholesterol, ADA diet.   He is asked not to shower until Wednesday, November 19, 2003.  He may sponge  bathe until then.  He is to keep his incision clean and dry.  He has an  office visit to pacer clinic on Monday, November 24, 2003, at 11:00 in the  morning.  He will  see Dr. Lovena Le at the [facility] office of [facility] in November.  Our office will arrange this appointment.   BRIEF HISTORY:  [name] is a 55 year old male.  He has a history of  diabetes and hypertension.  He also has a history of nonischemic  cardiomyopathy; ejection fraction is 15% to 20%.  He has no coronary  disease.  He has had class II heart failure symptoms despite maximal medical  therapy and severe left ventricular dysfunction for several years.  He has  had these diagnoses since March of 2004 and is now referred by Scarlett Presto, M.D., for a single-chamber defibrillator insertion.   HOSPITAL COURSE:  Patient presented on August 10.  He underwent implantation  of single pacer defibrillator the same day.  He was discharged after  interrogation of the device.  Post-procedure day #1 chest x-ray showed that  the leads were appropriately placed and intact.  The patient was advised to  hold Plavix for 48 hours following the procedure.  He went home with the  medications and followup as dictated.      Sueanne Margarita, P.A.                    Champ Mungo. Lovena Le, M.D.    GM/MEDQ  D:  12/09/2003  T:  12/09/2003  Job:  [id number]   cc:   Champ Mungo. Lovena Le, M.D.   Angus G. Everette Rank, M.D.  [address]  [place] [address]  Fax: [phone]   Scarlett Presto, M.D.  Fax: [phone]

## 2010-08-20 NOTE — Discharge Summary (Signed)
NAME:  [name], [name]                           ACCOUNT NO.:  [id number]   MEDICAL RECORD NO.:  [id number]                   PATIENT TYPE:  INP   LOCATION:  3739                                 FACILITY:  [facility]   PHYSICIAN:  Champ Mungo. Lovena Le, M.D.               DATE OF BIRTH:  1956-03-14   DATE OF ADMISSION:  11/12/2003  DATE OF DISCHARGE:  11/13/2003                           DISCHARGE SUMMARY - REFERRING   DISCHARGE DIAGNOSIS:  1.  Dilated cardiomyopathy with ejection fraction 20% status post AICD      placement.  2.  Class II congestive heart failure.  3.  Diabetes mellitus.  4.  Hyperlipidemia.  5.  History of cerebrovascular disease status post stroke.   HOSPITAL COURSE:  [name] is a 55 year old male patient who was admitted  on November 12, 2003, for implantable cardioverter defibrillator insertion.  He qualified for this procedure based on the STD-HeFT data with his CHF,  nonischemic cardiomyopathy, and class II heart failure.  The device was  implanted on November 12, 2003, and interrogated on November 13, 2003.  The  patient tolerated the procedure well and was ready for discharge to home the  day after implantation.  Please see admission history and physical for  complete details.   DISCHARGE MEDICATIONS:  Diovan 160 mg b.i.d., Norvasc 2.5 mg daily, Lasix 20  mg twice daily, Coreg 25 mg b.i.d., Aldactone 25 mg daily, Zocor 80 mg p.o.  q.h.s., potassium 20 mEq daily, baby aspirin daily, Plavix 75 mg daily,  restart Glucophage the day after discharge, Tylenol as needed.   DISCHARGE INSTRUCTIONS:  Activity sheet for details of postprocedure care.  Remain on a low sodium, low cholesterol, diabetic diet.  No showering until  Wednesday, November 19, 2003.  The patient may sponge bathe until that time.  [facility] up [facility] on November 24, 2003, at 11 a.m.  Dr. Lovena Le  will see the patient in the [place] office in November, the office will  call and arrange the  appointment.      Joesphine Bare, P.A. [facility]                      Champ Mungo. Lovena Le, M.D.    LB/MEDQ  D:  12/11/2003  T:  12/11/2003  Job:  [id number]

## 2010-08-20 NOTE — Cardiovascular Report (Signed)
NAME:  [name], [name]                           ACCOUNT NO.:  [id number]   MEDICAL RECORD NO.:  [id number]                   PATIENT TYPE:  OIB   LOCATION:  2854                                 FACILITY:  [facility]   PHYSICIAN:  Scarlett Presto, M.D.                DATE OF BIRTH:  [birth year]   DATE OF PROCEDURE:  07/25/2002  DATE OF DISCHARGE:  07/25/2002                              CARDIAC CATHETERIZATION   PROCEDURES:  1. Left heart catheterization.  2. Selective coronary angiography.  3. Right heart catheterization.  4. Left ventriculography.   CARDIOLOGIST:  Scarlett Presto, M.D.   HISTORY OF PRESENT ILLNESS:  The patient is a 55 year old African-American  gentleman with new onset heart failure complicated by a CVA, thought to be  hypertensive. Echocardiogram shows an EF of about 10 to 15% with global  hypokinesis.  He is here for a left heart catheterization.   DETAILS OF THE PROCEDURE:  After obtaining informed consent, the patient was  brought to the cardiology catheterization laboratory in a fasting state.  There he was prepped and draped in the usual sterile manner.  Conscious  sedation was obtained using 3 mg of Versed and 75 mcg of fentanyl.  The  right groin was anesthetized using 1% lidocaine without epinephrine.  The  right femoral artery was cannulated using a modified Seldinger technique  with a 6-French 10 cm sheath.  The right femoral artery was cannulated using  the modified Seldinger technique with an 8-French 10 cm sheath.  Right heart  catheterization was performed using a Swan-Ganz catheter which was advanced  under fluoroscopic guidance in the pulmonary capillary wedge position.  There pressure tracings were obtained.  Balloon was then deflated.  The  catheter was moved into the main pulmonary artery where pressure tracings  were obtained.  Then the catheter was used in a thermodilution cardiac  output assessment, and then simultaneous aliquots of femoral  artery and  pulmonary artery saturations were obtained to determine a Fick cardiac  output.  The catheter was then reconnected to the pressure tracing system,  moved back into the pulmonary capillary wedge position, and the pigtail  catheter was advanced under fluoroscopic guidance into the aorta and then  prolapsed across the aortic valve into the left ventricle.  Simultaneous  pressure tracings of the pulmonary artery capillary wedge pressure and the  left ventricular end-diastolic pressure were obtained.  The pulmonary  capillary wedge pressure catheter was then deflated and moved back into the  main pulmonary artery through a retrograde manner into the right ventricle  and then right atrium, and then the guide catheter was removed.  The pigtail  catheter was then connected to the power injection where a left  ventriculogram was performed in the 30 degree RAO view.  The catheter was  then reconnected to the pressure tracing system and then prolapsed across  the aortic valve under continuous  pressure monitoring.   Left heart catheterization was then performed using a 6-French Judkins left  #4 which cannulated the left main coronary.  The right coronary artery was  not cannulated due to a left-dominant system and an extremely small right  coronary artery.   RESULTS:  1. Right atrial pressure was measured A wave 17, V wave 15, mean right     atrial pressure 15 mmHg.  2. Right ventricular pressure was measured at 44/10 with an end-diastolic     pressure of 16 mmHg.  3. Pulmonary artery pressure was measured at 42/23 with a mean pulmonary     artery pressure of 32 mmHg.  4. Pulmonary capillary wedge pressure had an A wave 25, a V wave of 24, and     a mean of 22 mmHg.  5. Left ventricular pressure was 174/19 with an end-diastolic pressure of 29     mmHg.  6. Aortic pressure 186/100 with a mean arterial pressure of 137 mmHg.  7. Cardiac output by the Fick method was 5.0 liters per  minute with a     cardiac index of 2.1 liter per minute per meter squared.  8. Thermodilution cardiac out were the same numbers.  9. Pulmonary artery saturation was 62%, aortic saturation 95.   SELECTIVE CORONARY ANGIOGRAPHY:  1. Left main coronary artery  is a normal sized artery which is     angiographically unremarkable.  2. The left circumflex coronary artery is a large dominant vessel giving off     several large obtuse marginals and then a large posterior descending     coronary artery and angiographically free of disease.  3. The left circumflex coronary artery is a large artery which is     transapical, has several large diagonals, and is angiographically free of     disease.  4. The right coronary artery was not cannulated.   LEFT VENTRICULOGRAM:  Left ventriculogram reveals an overall ejection  fraction of 25% with a mildly dilated left ventricle and global hypokinesis  with 2+ mitral regurgitation.   ASSESSMENT:  This appears to be a hypertensive cardiomyopathy with  nonobstructive coronary disease.   PLAN:  Treat him aggressively with medical therapy and risk factor  modification.                                               Scarlett Presto, M.D.    JH/MEDQ  D:  07/25/2002  T:  07/26/2002  Job:  [id number]   cc:   Angus G. Everette Rank, M.D.  [address]  [place]  [place] [address]  Fax: [phone]

## 2010-08-20 NOTE — Assessment & Plan Note (Signed)
[name] is back regarding his right below-knee amputation.  He had a  injury to the leg right before a left rehab and he has had some ongoing  wound issues.  He is wearing a knee immobilizer for extra protection.  He is walking now really unlimited distances in the house on the left  leg alone using a rolling walker.  Pain has been under control at 1/10.  Sleep sometimes is poor.   REVIEW OF SYSTEMS:  The patient reports trouble walking, high sugars,  problem with appetite on occasion.  Other pertinent positives are above  and full review is in the written health and history section of the  chart.   SOCIAL HISTORY:  The patient is married, but lives alone.   PHYSICAL EXAMINATION:  VITAL SIGNS:  Blood pressure is 216/91, pulse is  75, respiratory rate is 16.  He is sating 91% on room air.  GENERAL:  The patient is pleasant, alert, and oriented x3.  Affect is  bright and appropriate.  EXTREMITIES:  The patient's right residual limb had a small area with  fibronecrotic tissue covering it measuring about 2 cm in width along the  wound line.  I was able to rub the wound generally with a dry gauze and  was able to remove some of the superficial tissue revealing more pink  granulation than fibronecrotic tissue underneath.  The patient had a  full knee extension.  Strength in the hip and knee is near 4-5/5.  He  had some tightness and hip extension, but I was able to extend to near  neutral, but not beyond.  Left leg is intact.  NEUROLOGIC:  Cognition is normal.  HEART:  Regular rate.  CHEST:  Clear.  ABDOMEN:  Soft, nontender.   ASSESSMENT:  1. Right below-knee amputation.  2. Hypertension.  3. Insulin-requiring diabetes.   PLAN:  1. We will try wet-to-dry packing daily with an ACE wrap to the right      leg.  I will send him to advanced prosthetics for stump shrinker      which he can begin using once this wound heals.  I expect him to be      ready for a fitting in about 2-4 weeks.   He will be following up      with Dr. Kellie [name] in about 2 weeks regarding the wound.  2. The patient was asked to follow up with his cardiologist regarding      his blood pressure.  He also discussed with      Dr. Everette [name]  3. I will see him back here on as-needed basis.      [name], M.D.  Electronically Signed     ZTS/MedQ  D:  12/10/2008 11:14:24  T:  12/10/2008 23:36:34  Job #:  [id number]   cc:   [name] G. [name] Rank, MD  Fax: [phone]   [name], [place] [address]  [place] [address]   [name], M.D.  [address]  [place]  [place] [address]

## 2010-08-31 ENCOUNTER — Ambulatory Visit (INDEPENDENT_AMBULATORY_CARE_PROVIDER_SITE_OTHER): Payer: BC Managed Care – PPO | Admitting: Vascular Surgery

## 2010-08-31 DIAGNOSIS — I739 Peripheral vascular disease, unspecified: Secondary | ICD-10-CM

## 2010-09-01 NOTE — Assessment & Plan Note (Signed)
OFFICE VISIT  [name], [name] A DOB:  Apr 23, 1955                                       08/31/2010 [id number]  Patient returns today for further follow-up regarding his left first toe ulceration.  He continues to have a nonhealing area which now has involved the first nail bed and some topical tissue as well.  This measures about 2 x 4 cm.  There is some exposed bone distally.  He had an angiogram performed by Dr. Bridgett Larsson, which I have reviewed, which was done with carbon dioxide and a very slight amount of contrast.  This revealed inline flow down through the anterior tibial artery to just above the ankle where it occludes.  There is some reconstitution of the disease, posterior tibial artery, but his biggest problem is severe distal disease.  I am concerned that even the left first toe amputation will not heal, although this will probably be necessary in the near future.  He does not want to proceed with that now but would rather continue local wound care.  I cautioned him about not waiting until this became infected proximally.  On exam today, blood pressure 175/91, heart rate 96, respirations 24. He continues to have a 3+ femoral and 2+ popliteal pulse palpable.  He will continue local wound care and return in 6-8 weeks for further follow-up unless he should decide he would like to proceed with the left foot toe amputation.    [name] [name], M.D. Electronically Signed  JDL/MEDQ  D:  08/31/2010  T:  09/01/2010  Job:  [id number]

## 2010-09-27 ENCOUNTER — Other Ambulatory Visit (HOSPITAL_COMMUNITY): Payer: Self-pay | Admitting: Family Medicine

## 2010-09-27 ENCOUNTER — Ambulatory Visit (HOSPITAL_COMMUNITY)
Admission: RE | Admit: 2010-09-27 | Discharge: 2010-09-27 | Disposition: A | Discharge status: Home / Self Care | Payer: BC Managed Care – PPO | Source: Ambulatory Visit | Attending: Family Medicine | Admitting: Family Medicine

## 2010-09-27 DIAGNOSIS — R0602 Shortness of breath: Secondary | ICD-10-CM | POA: Insufficient documentation

## 2010-09-27 DIAGNOSIS — R0600 Dyspnea, unspecified: Secondary | ICD-10-CM

## 2010-09-28 ENCOUNTER — Ambulatory Visit (INDEPENDENT_AMBULATORY_CARE_PROVIDER_SITE_OTHER): Payer: BC Managed Care – PPO | Admitting: Vascular Surgery

## 2010-09-28 DIAGNOSIS — I739 Peripheral vascular disease, unspecified: Secondary | ICD-10-CM

## 2010-09-29 NOTE — Assessment & Plan Note (Signed)
OFFICE VISIT  IZAAN, VANDERCOOK DOB:  May 04, 1955                                       09/28/2010 [id number]  This is a 55 year old male patient with previous right below-knee amputation, has well documented severe tibial disease from the ankle distally, having been seen by an angiogram performed by Dr. Bridgett Larsson on April 19th of this year.  The patient has gangrene of the left first toe, and we have been discussing possible first toe amputation for several weeks.  He is now ready to proceed.  He realizes that a first toe amputation may well not heal and lead to a subsequent left below- knee amputation, but we have no other options.  He has had no cellulitis but does have constant pain in the left first toe.  On examination, blood pressure today is 146/83, heart rate is 92, respirations 20.  The gangrene extends back from the crease of the left first toe, and he has dry gangrene.  Tissues do not look great but are pink.  I think there is a reasonable chance that this will heal (probably 50/50).  We have scheduled him for outpatient left first toe amputation to be done on Wednesday July 11, and hopefully we will get primary healing of this.  If not, he will require a below-knee amputation on the left.    [name] [name], M.D. Electronically Signed  JDL/MEDQ  D:  09/28/2010  T:  09/29/2010  Job:  [id number]

## 2010-10-07 ENCOUNTER — Encounter (HOSPITAL_COMMUNITY)
Admission: RE | Admit: 2010-10-07 | Discharge: 2010-10-07 | Disposition: A | Discharge status: Home / Self Care | Payer: BC Managed Care – PPO | Source: Ambulatory Visit | Attending: Vascular Surgery | Admitting: Vascular Surgery

## 2010-10-07 LAB — POCT I-STAT, CHEM 8
Calcium, Ion: 1.17 mmol/L (ref 1.12–1.32)
Chloride: 102 mEq/L (ref 96–112)
Creatinine, Ser: 1.9 mg/dL — ABNORMAL HIGH (ref 0.50–1.35)
Glucose, Bld: 524 mg/dL — ABNORMAL HIGH (ref 70–99)
HCT: 38 % — ABNORMAL LOW (ref 39.0–52.0)
Potassium: 4.5 mEq/L (ref 3.5–5.1)

## 2010-10-13 ENCOUNTER — Other Ambulatory Visit: Payer: Self-pay | Admitting: Vascular Surgery

## 2010-10-13 ENCOUNTER — Ambulatory Visit (HOSPITAL_COMMUNITY)
Admission: RE | Admit: 2010-10-13 | Discharge: 2010-10-13 | Disposition: A | Discharge status: Home / Self Care | Payer: BC Managed Care – PPO | Source: Ambulatory Visit | Attending: Vascular Surgery | Admitting: Vascular Surgery

## 2010-10-13 DIAGNOSIS — Z95810 Presence of automatic (implantable) cardiac defibrillator: Secondary | ICD-10-CM | POA: Insufficient documentation

## 2010-10-13 DIAGNOSIS — E119 Type 2 diabetes mellitus without complications: Secondary | ICD-10-CM | POA: Insufficient documentation

## 2010-10-13 DIAGNOSIS — I70269 Atherosclerosis of native arteries of extremities with gangrene, unspecified extremity: Secondary | ICD-10-CM | POA: Insufficient documentation

## 2010-10-13 DIAGNOSIS — Z01812 Encounter for preprocedural laboratory examination: Secondary | ICD-10-CM | POA: Insufficient documentation

## 2010-10-13 DIAGNOSIS — I10 Essential (primary) hypertension: Secondary | ICD-10-CM | POA: Insufficient documentation

## 2010-10-13 DIAGNOSIS — Z8673 Personal history of transient ischemic attack (TIA), and cerebral infarction without residual deficits: Secondary | ICD-10-CM | POA: Insufficient documentation

## 2010-10-13 DIAGNOSIS — E669 Obesity, unspecified: Secondary | ICD-10-CM | POA: Insufficient documentation

## 2010-10-13 DIAGNOSIS — I428 Other cardiomyopathies: Secondary | ICD-10-CM | POA: Insufficient documentation

## 2010-10-13 DIAGNOSIS — N289 Disorder of kidney and ureter, unspecified: Secondary | ICD-10-CM | POA: Insufficient documentation

## 2010-10-13 LAB — BASIC METABOLIC PANEL
BUN: 55 mg/dL — ABNORMAL HIGH (ref 6–23)
CO2: 26 mEq/L (ref 19–32)
Calcium: 9.3 mg/dL (ref 8.4–10.5)
Chloride: 100 mEq/L (ref 96–112)
Creatinine, Ser: 2.86 mg/dL — ABNORMAL HIGH (ref 0.50–1.35)
Glucose, Bld: 234 mg/dL — ABNORMAL HIGH (ref 70–99)

## 2010-10-13 LAB — CBC
HCT: 33.6 % — ABNORMAL LOW (ref 39.0–52.0)
Hemoglobin: 11.1 g/dL — ABNORMAL LOW (ref 13.0–17.0)
MCH: 28.3 pg (ref 26.0–34.0)
MCV: 85.7 fL (ref 78.0–100.0)
Platelets: 268 10*3/uL (ref 150–400)
RBC: 3.92 MIL/uL — ABNORMAL LOW (ref 4.22–5.81)
WBC: 10.1 10*3/uL (ref 4.0–10.5)

## 2010-10-13 LAB — GLUCOSE, CAPILLARY: Glucose-Capillary: 185 mg/dL — ABNORMAL HIGH (ref 70–99)

## 2010-10-14 LAB — GLUCOSE, CAPILLARY: Glucose-Capillary: 210 mg/dL — ABNORMAL HIGH (ref 70–99)

## 2010-10-19 ENCOUNTER — Ambulatory Visit (INDEPENDENT_AMBULATORY_CARE_PROVIDER_SITE_OTHER): Payer: BC Managed Care – PPO | Admitting: Vascular Surgery

## 2010-10-19 ENCOUNTER — Ambulatory Visit: Payer: BC Managed Care – PPO | Admitting: Vascular Surgery

## 2010-10-19 DIAGNOSIS — I70269 Atherosclerosis of native arteries of extremities with gangrene, unspecified extremity: Secondary | ICD-10-CM

## 2010-10-20 NOTE — Assessment & Plan Note (Signed)
OFFICE VISIT  DIMETRI, DRUCKMAN A DOB:  1955-04-21                                       10/19/2010 [id number]  Patient returns for initial follow-up regarding a left first toe amputation (transmetatarsal) for severe tibial disease and gangrene of the left first toe.  He has had no chills and fever.  He has had a minimal amount of drainage.  He has had significant pain along the plantar aspect of the left foot.  PHYSICAL EXAMINATION:  On examination, it is tender to compression, but not any purulent drainage is noted today.  The skin edges are still borderline but definitely not gangrenous.  There could be some deep infection.  I will start him on Cipro today, and he will return in 1 week for follow- up.  If he should have any increasing drainage at home, he will be in touch with [place] and will return to the office for removal of 1 or 2 of these sutures to begin packing the wound by home health.    [name] [name], M.D. Electronically Signed  JDL/MEDQ  D:  10/19/2010  T:  10/20/2010  Job:  [id number]

## 2010-10-20 NOTE — Op Note (Signed)
  [name], [name] NO.:  [id number]  MEDICAL RECORD NO.:  [id number]  LOCATION:  SDSC                         FACILITY:  [facility]  PHYSICIAN:  [name]. [name], M.D.  DATE OF BIRTH:  [birth year]  DATE OF PROCEDURE:  10/13/2010 DATE OF DISCHARGE:  10/13/2010                              OPERATIVE REPORT   PREOPERATIVE DIAGNOSIS:  Gangrene of left first toe secondary to severe tibial occlusive disease.  POSTOPERATIVE DIAGNOSIS:  Gangrene of left first toe secondary to severe tibial occlusive disease.  OPERATION:  Left first transmetatarsal toe amputation.  SURGEON:  [name]. [name], MD  FIRST ASSISTANT:  Nurse.  ANESTHESIA:  LMA.  PROCEDURE:  The patient was taken to the operating room, placed in supine position, at which time, a satisfactory LMA general anesthesia was administered.  Left foot and lower leg were prepped with Betadine scrub solution, draped in a routine sterile manner.  There was gangrene of left first toe extending back to the just distal to the crease.  A tennis racket-type incision was marked prior to the incision for a transmetatarsal first toe amputation.  An elliptical incision was made around the base of the toe and extended down the medial aspect of the foot down the first metatarsal bone about 4-5 cm.  This was carried down through subcutaneous tissue and the metatarsophalangeal joint was disarticulated and the specimen was removed from the table.  The head of the metatarsal and distal metatarsal were then transected with the bone cutter and shortened with the rongeur, smoothing it as much as possible. Adequate hemostasis was achieved.  There was some bleeding from the skin edges and the depth of the wound.  There was no evidence of any infection or purulence.  Following thorough irrigation, the deep layer was closed with interrupted 3-0 Vicryl and the skin with vertical mattress sutures of 3-0 nylon.  A sterile dressing composed  of Vaseline gauze, 4x4s, Kerlix, and a loosely wrapped Ace was placed.  The patient was taken to the recovery in stable condition.     [name] [name], M.D.     JDL/MEDQ  D:  10/13/2010  T:  10/13/2010  Job:  [id number]  Electronically Signed by [name] M.D. on 10/20/2010 03:04:19 PM

## 2010-10-22 ENCOUNTER — Encounter: Payer: BC Managed Care – PPO | Admitting: *Deleted

## 2010-10-24 ENCOUNTER — Other Ambulatory Visit: Payer: Self-pay | Admitting: *Deleted

## 2010-10-24 MED ORDER — CARVEDILOL 25 MG PO TABS: 25.0000 mg | ORAL_TABLET | Freq: Two times a day (BID) | ORAL | Status: DC

## 2010-10-25 ENCOUNTER — Ambulatory Visit (INDEPENDENT_AMBULATORY_CARE_PROVIDER_SITE_OTHER): Payer: BC Managed Care – PPO | Admitting: Vascular Surgery

## 2010-10-25 DIAGNOSIS — I70219 Atherosclerosis of native arteries of extremities with intermittent claudication, unspecified extremity: Secondary | ICD-10-CM

## 2010-10-25 DIAGNOSIS — T8140XA Infection following a procedure, unspecified, initial encounter: Secondary | ICD-10-CM

## 2010-10-26 NOTE — Assessment & Plan Note (Signed)
OFFICE VISIT  [name], [name] DOB:  Sep 13, 1955                                       10/25/2010 [id number]  The patient returns for further examination of his left transmetatarsal amputation of the first toe.  He is known to have severe tibial occlusive disease by angiography with total occlusion of his only patent tibial vessel above the ankle.  Amputation was performed on July 11 and today there is clear, moist gangrene of the anterior aspect of the amputation site.  The posterior flap appears viable.  There is some slight purulent drainage.  He has had no chills or fever.  He continues to have [name] 2+ popliteal pulse palpable.  I discussed this with him today and feel that he will probably eventually require below knee amputation but he would like another opinion from Dr. Meridee Score regarding possible transmetatarsal amputation of the foot.  I did talk to Dr. Sharol Given today who is willing to see the patient today so he will evaluate him and make [name] recommendation. Blood pressure today is 110/70, heart rate 20, respirations 14.    [name]. [name], M.D. Electronically Signed  JDL/MEDQ  D:  10/25/2010  T:  10/26/2010  Job:  [id number]

## 2010-10-29 ENCOUNTER — Encounter (HOSPITAL_COMMUNITY)
Admission: RE | Admit: 2010-10-29 | Discharge: 2010-10-29 | Disposition: A | Discharge status: Home / Self Care | Payer: BC Managed Care – PPO | Source: Ambulatory Visit | Attending: Orthopedic Surgery | Admitting: Orthopedic Surgery

## 2010-10-29 LAB — APTT: aPTT: 37 seconds (ref 24–37)

## 2010-10-29 LAB — COMPREHENSIVE METABOLIC PANEL
AST: 9 U/L (ref 0–37)
Albumin: 2.6 g/dL — ABNORMAL LOW (ref 3.5–5.2)
Alkaline Phosphatase: 59 U/L (ref 39–117)
BUN: 47 mg/dL — ABNORMAL HIGH (ref 6–23)
Creatinine, Ser: 1.95 mg/dL — ABNORMAL HIGH (ref 0.50–1.35)
Potassium: 5 mEq/L (ref 3.5–5.1)
Total Protein: 7 g/dL (ref 6.0–8.3)

## 2010-10-29 LAB — CBC
HCT: 26.9 % — ABNORMAL LOW (ref 39.0–52.0)
Hemoglobin: 8.6 g/dL — ABNORMAL LOW (ref 13.0–17.0)
MCHC: 32 g/dL (ref 30.0–36.0)
WBC: 12.5 10*3/uL — ABNORMAL HIGH (ref 4.0–10.5)

## 2010-10-29 LAB — PROTIME-INR
INR: 1.2 (ref 0.00–1.49)
Prothrombin Time: 15.5 seconds — ABNORMAL HIGH (ref 11.6–15.2)

## 2010-10-29 LAB — SURGICAL PCR SCREEN
MRSA, PCR: NEGATIVE
Staphylococcus aureus: NEGATIVE

## 2010-11-01 DIAGNOSIS — I70269 Atherosclerosis of native arteries of extremities with gangrene, unspecified extremity: Secondary | ICD-10-CM

## 2010-11-03 ENCOUNTER — Other Ambulatory Visit: Payer: Self-pay | Admitting: Orthopedic Surgery

## 2010-11-03 DIAGNOSIS — I120 Hypertensive chronic kidney disease with stage 5 chronic kidney disease or end stage renal disease: Secondary | ICD-10-CM | POA: Diagnosis present

## 2010-11-03 DIAGNOSIS — L98499 Non-pressure chronic ulcer of skin of other sites with unspecified severity: Secondary | ICD-10-CM | POA: Diagnosis present

## 2010-11-03 DIAGNOSIS — Z794 Long term (current) use of insulin: Secondary | ICD-10-CM

## 2010-11-03 DIAGNOSIS — I798 Other disorders of arteries, arterioles and capillaries in diseases classified elsewhere: Secondary | ICD-10-CM | POA: Diagnosis present

## 2010-11-03 DIAGNOSIS — S88119A Complete traumatic amputation at level between knee and ankle, unspecified lower leg, initial encounter: Secondary | ICD-10-CM

## 2010-11-03 DIAGNOSIS — N186 End stage renal disease: Secondary | ICD-10-CM | POA: Diagnosis present

## 2010-11-03 DIAGNOSIS — M9080 Osteopathy in diseases classified elsewhere, unspecified site: Secondary | ICD-10-CM | POA: Diagnosis present

## 2010-11-03 DIAGNOSIS — L408 Other psoriasis: Secondary | ICD-10-CM | POA: Diagnosis present

## 2010-11-03 DIAGNOSIS — E1169 Type 2 diabetes mellitus with other specified complication: Principal | ICD-10-CM | POA: Diagnosis present

## 2010-11-03 DIAGNOSIS — Z91041 Radiographic dye allergy status: Secondary | ICD-10-CM

## 2010-11-03 DIAGNOSIS — D649 Anemia, unspecified: Secondary | ICD-10-CM | POA: Diagnosis present

## 2010-11-03 DIAGNOSIS — M869 Osteomyelitis, unspecified: Secondary | ICD-10-CM | POA: Diagnosis present

## 2010-11-03 DIAGNOSIS — I96 Gangrene, not elsewhere classified: Secondary | ICD-10-CM | POA: Diagnosis present

## 2010-11-03 DIAGNOSIS — E1159 Type 2 diabetes mellitus with other circulatory complications: Secondary | ICD-10-CM | POA: Diagnosis present

## 2010-11-03 DIAGNOSIS — E1129 Type 2 diabetes mellitus with other diabetic kidney complication: Secondary | ICD-10-CM | POA: Diagnosis present

## 2010-11-03 DIAGNOSIS — N179 Acute kidney failure, unspecified: Secondary | ICD-10-CM | POA: Diagnosis not present

## 2010-11-03 LAB — GLUCOSE, CAPILLARY: Glucose-Capillary: 310 mg/dL — ABNORMAL HIGH (ref 70–99)

## 2010-11-04 LAB — BASIC METABOLIC PANEL
BUN: 83 mg/dL — ABNORMAL HIGH (ref 6–23)
CO2: 28 mEq/L (ref 19–32)
Chloride: 96 mEq/L (ref 96–112)
GFR calc Af Amer: 28 mL/min — ABNORMAL LOW (ref >60)
Potassium: 5 mEq/L (ref 3.5–5.1)

## 2010-11-04 LAB — HEMOGLOBIN A1C
Hgb A1c MFr Bld: 10 % — ABNORMAL HIGH (ref <5.7)
Mean Plasma Glucose: 240 mg/dL — ABNORMAL HIGH (ref <117)

## 2010-11-04 LAB — CBC
HCT: 24.6 % — ABNORMAL LOW (ref 39.0–52.0)
RBC: 2.79 MIL/uL — ABNORMAL LOW (ref 4.22–5.81)
RDW: 12.7 % (ref 11.5–15.5)
WBC: 14.7 10*3/uL — ABNORMAL HIGH (ref 4.0–10.5)

## 2010-11-04 LAB — GLUCOSE, CAPILLARY: Glucose-Capillary: 258 mg/dL — ABNORMAL HIGH (ref 70–99)

## 2010-11-05 LAB — GLUCOSE, CAPILLARY: Glucose-Capillary: 106 mg/dL — ABNORMAL HIGH (ref 70–99)

## 2010-11-05 LAB — BASIC METABOLIC PANEL
BUN: 80 mg/dL — ABNORMAL HIGH (ref 6–23)
Creatinine, Ser: 2.74 mg/dL — ABNORMAL HIGH (ref 0.50–1.35)
GFR calc Af Amer: 29 mL/min — ABNORMAL LOW (ref >60)
GFR calc non Af Amer: 24 mL/min — ABNORMAL LOW (ref >60)

## 2010-11-05 LAB — CBC
HCT: 19 % — ABNORMAL LOW (ref 39.0–52.0)
MCHC: 32.6 g/dL (ref 30.0–36.0)
MCV: 87.2 fL (ref 78.0–100.0)
RDW: 13.2 % (ref 11.5–15.5)

## 2010-11-06 LAB — BASIC METABOLIC PANEL
BUN: 78 mg/dL — ABNORMAL HIGH (ref 6–23)
Chloride: 98 mEq/L (ref 96–112)
Creatinine, Ser: 2.35 mg/dL — ABNORMAL HIGH (ref 0.50–1.35)
GFR calc Af Amer: 35 mL/min — ABNORMAL LOW (ref >60)
Glucose, Bld: 216 mg/dL — ABNORMAL HIGH (ref 70–99)

## 2010-11-06 LAB — CROSSMATCH

## 2010-11-06 LAB — CBC
HCT: 27.4 % — ABNORMAL LOW (ref 39.0–52.0)
MCV: 86.7 fL (ref 78.0–100.0)
RDW: 14.5 % (ref 11.5–15.5)
WBC: 10.7 10*3/uL — ABNORMAL HIGH (ref 4.0–10.5)

## 2010-11-06 LAB — GLUCOSE, CAPILLARY: Glucose-Capillary: 147 mg/dL — ABNORMAL HIGH (ref 70–99)

## 2010-11-07 LAB — GLUCOSE, CAPILLARY: Glucose-Capillary: 206 mg/dL — ABNORMAL HIGH (ref 70–99)

## 2010-11-08 LAB — GLUCOSE, CAPILLARY
Glucose-Capillary: 122 mg/dL — ABNORMAL HIGH (ref 70–99)
Glucose-Capillary: 124 mg/dL — ABNORMAL HIGH (ref 70–99)
Glucose-Capillary: 189 mg/dL — ABNORMAL HIGH (ref 70–99)

## 2010-11-08 LAB — HEMOGLOBIN AND HEMATOCRIT, BLOOD
HCT: 28.2 % — ABNORMAL LOW (ref 39.0–52.0)
Hemoglobin: 9.3 g/dL — ABNORMAL LOW (ref 13.0–17.0)

## 2010-11-09 LAB — GLUCOSE, CAPILLARY
Glucose-Capillary: 302 mg/dL — ABNORMAL HIGH (ref 70–99)
Glucose-Capillary: 107 mg/dL — ABNORMAL HIGH (ref 70–99)
Glucose-Capillary: 279 mg/dL — ABNORMAL HIGH (ref 70–99)

## 2010-11-10 LAB — GLUCOSE, CAPILLARY
Glucose-Capillary: 209 mg/dL — ABNORMAL HIGH (ref 70–99)
Glucose-Capillary: 191 mg/dL — ABNORMAL HIGH (ref 70–99)

## 2010-11-10 LAB — VITAMIN D 1,25 DIHYDROXY: Vitamin D3 1, 25 (OH)2: 13 pg/mL

## 2010-11-11 LAB — GLUCOSE, CAPILLARY
Glucose-Capillary: 213 mg/dL — ABNORMAL HIGH (ref 70–99)
Glucose-Capillary: 279 mg/dL — ABNORMAL HIGH (ref 70–99)

## 2010-11-12 LAB — GLUCOSE, CAPILLARY
Glucose-Capillary: 302 mg/dL — ABNORMAL HIGH (ref 70–99)
Glucose-Capillary: 158 mg/dL — ABNORMAL HIGH (ref 70–99)
Glucose-Capillary: 202 mg/dL — ABNORMAL HIGH (ref 70–99)

## 2010-11-13 LAB — GLUCOSE, CAPILLARY: Glucose-Capillary: 143 mg/dL — ABNORMAL HIGH (ref 70–99)

## 2010-11-14 LAB — GLUCOSE, CAPILLARY
Glucose-Capillary: 312 mg/dL — ABNORMAL HIGH (ref 70–99)
Glucose-Capillary: 219 mg/dL — ABNORMAL HIGH (ref 70–99)
Glucose-Capillary: 147 mg/dL — ABNORMAL HIGH (ref 70–99)

## 2010-11-14 LAB — BASIC METABOLIC PANEL WITH GFR
BUN: 104 mg/dL — ABNORMAL HIGH (ref 6–23)
CO2: 32 meq/L (ref 19–32)
Calcium: 9.4 mg/dL (ref 8.4–10.5)
Chloride: 97 meq/L (ref 96–112)
Creatinine, Ser: 2.73 mg/dL — ABNORMAL HIGH (ref 0.50–1.35)
GFR calc Af Amer: 29 mL/min — ABNORMAL LOW
GFR calc non Af Amer: 24 mL/min — ABNORMAL LOW
Glucose, Bld: 240 mg/dL — ABNORMAL HIGH (ref 70–99)
Potassium: 4.2 meq/L (ref 3.5–5.1)
Sodium: 138 meq/L (ref 135–145)

## 2010-11-14 LAB — HEMOGLOBIN AND HEMATOCRIT, BLOOD
HCT: 28.3 % — ABNORMAL LOW (ref 39.0–52.0)
Hemoglobin: 9.1 g/dL — ABNORMAL LOW (ref 13.0–17.0)

## 2010-11-15 LAB — GLUCOSE, CAPILLARY
Glucose-Capillary: 149 mg/dL — ABNORMAL HIGH (ref 70–99)
Glucose-Capillary: 237 mg/dL — ABNORMAL HIGH (ref 70–99)

## 2010-11-16 DIAGNOSIS — I739 Peripheral vascular disease, unspecified: Secondary | ICD-10-CM

## 2010-11-16 DIAGNOSIS — S88119A Complete traumatic amputation at level between knee and ankle, unspecified lower leg, initial encounter: Secondary | ICD-10-CM

## 2010-11-16 DIAGNOSIS — L98499 Non-pressure chronic ulcer of skin of other sites with unspecified severity: Secondary | ICD-10-CM

## 2010-11-16 LAB — GLUCOSE, CAPILLARY
Glucose-Capillary: 203 mg/dL — ABNORMAL HIGH (ref 70–99)
Glucose-Capillary: 150 mg/dL — ABNORMAL HIGH (ref 70–99)

## 2010-11-17 ENCOUNTER — Inpatient Hospital Stay (HOSPITAL_COMMUNITY)
Admission: RE | Admit: 2010-11-17 | Discharge: 2010-11-25 | DRG: 462 | Disposition: A | Discharge status: Home Health | Payer: BC Managed Care – PPO | Source: Other Acute Inpatient Hospital | Attending: Physical Medicine & Rehabilitation | Admitting: Physical Medicine & Rehabilitation

## 2010-11-17 DIAGNOSIS — I70269 Atherosclerosis of native arteries of extremities with gangrene, unspecified extremity: Secondary | ICD-10-CM

## 2010-11-17 DIAGNOSIS — M869 Osteomyelitis, unspecified: Secondary | ICD-10-CM

## 2010-11-17 DIAGNOSIS — I509 Heart failure, unspecified: Secondary | ICD-10-CM

## 2010-11-17 DIAGNOSIS — Z79899 Other long term (current) drug therapy: Secondary | ICD-10-CM

## 2010-11-17 DIAGNOSIS — S88119A Complete traumatic amputation at level between knee and ankle, unspecified lower leg, initial encounter: Secondary | ICD-10-CM

## 2010-11-17 DIAGNOSIS — Z7902 Long term (current) use of antithrombotics/antiplatelets: Secondary | ICD-10-CM

## 2010-11-17 DIAGNOSIS — D6489 Other specified anemias: Secondary | ICD-10-CM

## 2010-11-17 DIAGNOSIS — E1169 Type 2 diabetes mellitus with other specified complication: Secondary | ICD-10-CM

## 2010-11-17 DIAGNOSIS — I428 Other cardiomyopathies: Secondary | ICD-10-CM

## 2010-11-17 DIAGNOSIS — N189 Chronic kidney disease, unspecified: Secondary | ICD-10-CM

## 2010-11-17 DIAGNOSIS — M9080 Osteopathy in diseases classified elsewhere, unspecified site: Secondary | ICD-10-CM

## 2010-11-17 DIAGNOSIS — L97509 Non-pressure chronic ulcer of other part of unspecified foot with unspecified severity: Secondary | ICD-10-CM

## 2010-11-17 DIAGNOSIS — E1159 Type 2 diabetes mellitus with other circulatory complications: Secondary | ICD-10-CM

## 2010-11-17 DIAGNOSIS — Z833 Family history of diabetes mellitus: Secondary | ICD-10-CM

## 2010-11-17 DIAGNOSIS — Z8673 Personal history of transient ischemic attack (TIA), and cerebral infarction without residual deficits: Secondary | ICD-10-CM

## 2010-11-17 DIAGNOSIS — Z7982 Long term (current) use of aspirin: Secondary | ICD-10-CM

## 2010-11-17 DIAGNOSIS — I129 Hypertensive chronic kidney disease with stage 1 through stage 4 chronic kidney disease, or unspecified chronic kidney disease: Secondary | ICD-10-CM

## 2010-11-17 DIAGNOSIS — Z95810 Presence of automatic (implantable) cardiac defibrillator: Secondary | ICD-10-CM

## 2010-11-17 DIAGNOSIS — Z87891 Personal history of nicotine dependence: Secondary | ICD-10-CM

## 2010-11-17 DIAGNOSIS — N179 Acute kidney failure, unspecified: Secondary | ICD-10-CM

## 2010-11-17 DIAGNOSIS — Z794 Long term (current) use of insulin: Secondary | ICD-10-CM

## 2010-11-17 DIAGNOSIS — Z5189 Encounter for other specified aftercare: Principal | ICD-10-CM

## 2010-11-17 DIAGNOSIS — D62 Acute posthemorrhagic anemia: Secondary | ICD-10-CM

## 2010-11-17 LAB — GLUCOSE, CAPILLARY
Glucose-Capillary: 106 mg/dL — ABNORMAL HIGH (ref 70–99)
Glucose-Capillary: 86 mg/dL (ref 70–99)

## 2010-11-18 ENCOUNTER — Encounter: Payer: Self-pay | Admitting: *Deleted

## 2010-11-18 DIAGNOSIS — S88119A Complete traumatic amputation at level between knee and ankle, unspecified lower leg, initial encounter: Secondary | ICD-10-CM

## 2010-11-18 DIAGNOSIS — I70269 Atherosclerosis of native arteries of extremities with gangrene, unspecified extremity: Secondary | ICD-10-CM

## 2010-11-18 DIAGNOSIS — N179 Acute kidney failure, unspecified: Secondary | ICD-10-CM

## 2010-11-18 LAB — DIFFERENTIAL
Basophils Relative: 0 % (ref 0–1)
Eosinophils Absolute: 0.1 10*3/uL (ref 0.0–0.7)
Neutro Abs: 5.7 10*3/uL (ref 1.7–7.7)
Neutrophils Relative %: 74 % (ref 43–77)

## 2010-11-18 LAB — COMPREHENSIVE METABOLIC PANEL
Albumin: 2.9 g/dL — ABNORMAL LOW (ref 3.5–5.2)
BUN: 107 mg/dL — ABNORMAL HIGH (ref 6–23)
Creatinine, Ser: 3.01 mg/dL — ABNORMAL HIGH (ref 0.50–1.35)
GFR calc Af Amer: 26 mL/min — ABNORMAL LOW (ref >60)
Total Bilirubin: 0.1 mg/dL — ABNORMAL LOW (ref 0.3–1.2)
Total Protein: 6.9 g/dL (ref 6.0–8.3)

## 2010-11-18 LAB — GLUCOSE, CAPILLARY
Glucose-Capillary: 79 mg/dL (ref 70–99)
Glucose-Capillary: 105 mg/dL — ABNORMAL HIGH (ref 70–99)
Glucose-Capillary: 188 mg/dL — ABNORMAL HIGH (ref 70–99)

## 2010-11-18 LAB — URINALYSIS, ROUTINE W REFLEX MICROSCOPIC
Bilirubin Urine: NEGATIVE
Glucose, UA: NEGATIVE mg/dL
Ketones, ur: NEGATIVE mg/dL
Leukocytes, UA: NEGATIVE
Protein, ur: NEGATIVE mg/dL
pH: 6 (ref 5.0–8.0)

## 2010-11-18 LAB — CBC
HCT: 27.7 % — ABNORMAL LOW (ref 39.0–52.0)
MCHC: 32.5 g/dL (ref 30.0–36.0)
MCV: 88.2 fL (ref 78.0–100.0)
RDW: 13.4 % (ref 11.5–15.5)

## 2010-11-18 LAB — BASIC METABOLIC PANEL
Calcium: 8.9 mg/dL (ref 8.4–10.5)
Creatinine, Ser: 2.89 mg/dL — ABNORMAL HIGH (ref 0.50–1.35)
GFR calc non Af Amer: 23 mL/min — ABNORMAL LOW (ref >60)
Glucose, Bld: 142 mg/dL — ABNORMAL HIGH (ref 70–99)
Sodium: 141 mEq/L (ref 135–145)

## 2010-11-19 LAB — GLUCOSE, CAPILLARY: Glucose-Capillary: 89 mg/dL (ref 70–99)

## 2010-11-19 NOTE — Discharge Summary (Signed)
  NAMEEDLEY, [place] NO.:  [id number]  MEDICAL RECORD NO.:  [id number]  LOCATION:  [id number]                         FACILITY:  [facility]  PHYSICIAN:  Newt Minion, MD     DATE OF BIRTH:  1955-08-19  DATE OF ADMISSION:  11/03/2010 DATE OF DISCHARGE:  11/05/2010                              DISCHARGE SUMMARY   FINAL DIAGNOSES: 1. 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 2. Type 2 diabetes with hemoglobin A1c of 10.0. 3. Peripheral vascular disease with renal disease.  Discharge to skilled nursing in stable condition.  Follow up with Dr. Sharol Given in 2 weeks after discharge, physical therapy, progressive ambulation, nonweightbearing on the left, ambulate with his prosthesis on the right.  DISCHARGE MEDICATIONS:  As per his admission medical reconciliation form.  Prescription provided for Percocet for pain one to two p.o. q.4 h. p.r.n. for pain.  The patient's potassium was 5.0 on November 05, 2010, his K-Dur was held on November 05, 2010, and he will need his potassium monitored prior to restarting his potassium supplements.     Newt Minion, MD     MVD/MEDQ  D:  11/05/2010  T:  11/05/2010  Job:  [id number]  Electronically Signed by Meridee Score MD on 11/19/2010 06:19:51 AM

## 2010-11-19 NOTE — Discharge Summary (Signed)
  NAMEGIVON, DONAHOO NO.:  [id number]  MEDICAL RECORD NO.:  [id number]  LOCATION:  [id number]                         FACILITY:  [facility]  PHYSICIAN:  Newt Minion, MD     DATE OF BIRTH:  1955-10-11  DATE OF ADMISSION:  11/03/2010 DATE OF DISCHARGE:  11/15/2010                              DISCHARGE SUMMARY   ADDENDUM: The patient's sliding scale insulin has been modified to include his insulin to 10 units with meal coverage.  No change in the long-acting insulin.  The patient has shown worsening of his chronic end-stage renal disease, BUN at admission of 47, creatinine of 1.95, current BUN 104 with a creatinine of 2.73.  The patient has chronic end-stage renal disease, will most likely need followup with Nephrology.     Newt Minion, MD     MVD/MEDQ  D:  11/15/2010  T:  11/15/2010  Job:  [id number]  Electronically Signed by Meridee Score MD on 11/19/2010 06:19:58 AM

## 2010-11-19 NOTE — Discharge Summary (Signed)
  NAMESIYUAN, GOOGE NO.:  [id number]  MEDICAL RECORD NO.:  [id number]  LOCATION:  [id number]                         FACILITY:  [facility]  PHYSICIAN:  Newt Minion, MD     DATE OF BIRTH:  Jul 21, 1955  DATE OF ADMISSION:  11/03/2010 DATE OF DISCHARGE:  11/08/2010                              DISCHARGE SUMMARY   FINAL DIAGNOSIS:  Ulcer, osteomyelitis, left foot.  PROCEDURE:  Left transtibial amputation.  Discharged to skilled nursing in stable condition.  Follow up with Dr. Sharol Given in 2 weeks.  Physical therapy, progressive ambulation with a prosthesis on the right, nonweightbearing on the left. Dressing changes to the left lower extremity as needed.  Medications as per the medical reconciliation form with prescription for Percocet for pain.  Follow up in the office in 2 weeks.     Newt Minion, MD     MVD/MEDQ  D:  11/08/2010  T:  11/08/2010  Job:  [id number]  Electronically Signed by Meridee Score MD on 11/19/2010 06:19:54 AM

## 2010-11-19 NOTE — Op Note (Signed)
  NAMEANISH, LAESSIG NO.:  [id number]  MEDICAL RECORD NO.:  [id number]  LOCATION:  2899                         FACILITY:  [facility]  PHYSICIAN:  Newt Minion, MD     DATE OF BIRTH:  [birth year]  DATE OF PROCEDURE:  11/03/2010 DATE OF DISCHARGE:                              OPERATIVE REPORT   PREOPERATIVE DIAGNOSIS:  Gangrene, left foot with osteomyelitis and ulceration.  POSTOPERATIVE DIAGNOSIS:  Gangrene, left foot with osteomyelitis and ulceration.  PROCEDURE:  Left transtibial amputation.  SURGEON:  Newt Minion, MD  ANESTHESIA:  General.  ESTIMATED BLOOD LOSS:  Minimal.  ANTIBIOTICS:  Kefzol 2 g.  DRAINS:  None.  COMPLICATIONS:  None.  TOURNIQUET TIME:  7 minutes at 300 mmHg at the thigh.  DISPOSITION:  To PACU in stable condition.  INDICATION FOR THE PROCEDURE:  The patient is a 55 year old gentleman, a type 2 diabetic with renal disease, peripheral vascular disease, and hypertension, who presents at this time for left transtibial amputation after failure of conservative treatment, failure of foot salvage surgery for the left lower extremity.  The patient is status post a right transtibial amputation.  The patient also has psoriasis.  Due to the failure of conservative care, the patient presents at this time for left transtibial amputation.  Risks and benefits were discussed including infection, neurovascular injury, nonhealing of the wound, need for additional surgery, need for higher-level amputation.  The patient states he understands and wished to proceed at this time.  DESCRIPTION OF THE PROCEDURE:  The patient was brought to OR room 10 and underwent general anesthetic.  After adequate level of anesthesia obtained, the patient's left lower extremity was prepped using DuraPrep, and draped into a sterile field and the left foot was draped out of these sterile surgical field with impervious stockinette.  A transverse incision was  made, 11 cm distal to the tibial tubercle.  This curved proximally and a large posterior flap was created.  The tibia was transected just proximal to the skin incision by 1 cm with the anterior aspect beveled and the fibula was transected just proximal to the tibial incision.  An amputation knife was used to create a large posterior flap.  The sciatic nerve was pulled, cut, and allowed to retract.  The vascular bundles were suture ligated x2 each with 2-0 silk.  The tourniquet was deflated.  Hemostasis was obtained.  The deep and superficial fascial layers were closed using #1 PDS.  The skin was closed using approximating staples.  The wound was covered with Adaptic orthopedic sponges, Kerlix, and Coban.  The patient was extubated and taken to the PACU in stable condition. Anticipate discharge to skilled nursing.  The patient does have a transtibial amputation on the right as well.     Newt Minion, MD     MVD/MEDQ  D:  11/03/2010  T:  11/03/2010  Job:  [id number]  Electronically Signed by Meridee Score MD on 11/19/2010 06:19:48 AM

## 2010-11-20 LAB — GLUCOSE, CAPILLARY
Glucose-Capillary: 54 mg/dL — ABNORMAL LOW (ref 70–99)
Glucose-Capillary: 128 mg/dL — ABNORMAL HIGH (ref 70–99)
Glucose-Capillary: 164 mg/dL — ABNORMAL HIGH (ref 70–99)
Glucose-Capillary: 136 mg/dL — ABNORMAL HIGH (ref 70–99)

## 2010-11-21 LAB — GLUCOSE, CAPILLARY: Glucose-Capillary: 91 mg/dL (ref 70–99)

## 2010-11-22 LAB — GLUCOSE, CAPILLARY
Glucose-Capillary: 50 mg/dL — ABNORMAL LOW (ref 70–99)
Glucose-Capillary: 76 mg/dL (ref 70–99)

## 2010-11-22 LAB — BASIC METABOLIC PANEL
BUN: 66 mg/dL — ABNORMAL HIGH (ref 6–23)
Calcium: 9.4 mg/dL (ref 8.4–10.5)
Creatinine, Ser: 2.07 mg/dL — ABNORMAL HIGH (ref 0.50–1.35)
GFR calc non Af Amer: 33 mL/min — ABNORMAL LOW (ref >60)
Glucose, Bld: 52 mg/dL — ABNORMAL LOW (ref 70–99)

## 2010-11-23 LAB — GLUCOSE, CAPILLARY
Glucose-Capillary: 79 mg/dL (ref 70–99)
Glucose-Capillary: 107 mg/dL — ABNORMAL HIGH (ref 70–99)

## 2010-11-24 LAB — GLUCOSE, CAPILLARY
Glucose-Capillary: 64 mg/dL — ABNORMAL LOW (ref 70–99)
Glucose-Capillary: 81 mg/dL (ref 70–99)
Glucose-Capillary: 161 mg/dL — ABNORMAL HIGH (ref 70–99)

## 2010-11-24 LAB — BASIC METABOLIC PANEL
Calcium: 9.5 mg/dL (ref 8.4–10.5)
GFR calc non Af Amer: 35 mL/min — ABNORMAL LOW (ref >60)
Glucose, Bld: 148 mg/dL — ABNORMAL HIGH (ref 70–99)
Sodium: 145 mEq/L (ref 135–145)

## 2010-11-25 LAB — GLUCOSE, CAPILLARY
Glucose-Capillary: 51 mg/dL — ABNORMAL LOW (ref 70–99)
Glucose-Capillary: 124 mg/dL — ABNORMAL HIGH (ref 70–99)

## 2010-11-25 LAB — BASIC METABOLIC PANEL
BUN: 64 mg/dL — ABNORMAL HIGH (ref 6–23)
CO2: 29 mEq/L (ref 19–32)
Chloride: 104 mEq/L (ref 96–112)
Creatinine, Ser: 2.41 mg/dL — ABNORMAL HIGH (ref 0.50–1.35)

## 2010-11-30 NOTE — H&P (Signed)
NAMERYOT, CORNELISON NO.:  [id number]  MEDICAL RECORD NO.:  [id number]  LOCATION:  4008                         FACILITY:  [facility]  PHYSICIAN:  Meredith Staggers, M.D.DATE OF BIRTH:  [birth year]  DATE OF ADMISSION:  11/17/2010 DATE OF DISCHARGE:                             HISTORY & PHYSICAL   CHIEF COMPLAINT:  Left leg pain and amputation.  PRIMARY CARE PHYSICIAN:  Angus G. Everette Rank, MD  SURGEON:  Newt Minion, MD  HISTORY OF PRESENT ILLNESS:  This is a 55 year old African American male with diabetes and PVD with a right below-knee amputation in July 2010. He had first toe amputation in July of this year and further gangrenous changes of the foot with osteo.  He was admitted on November 03, 2010, for left BKA by Dr. Sharol Given.  Postoperatively, he had acute renal failure, creatinine peaking at 2.7.  He also had acute on chronic anemia with hemoglobin dropping to 6.2.  His has been treated with 2 units of packed red blood cells.  Therapies were initiated.  The patient needs cues for safety.  He was noted to have loss of balance with ambulation as well. Rehab was asked to evaluate this patient in setting his modified independent goals, felt he could benefit from rehab.  REVIEW OF SYSTEMS:  Notable for wound care issues, pain.  Bowel movement last night.  Full 12-point review is in the written H and P.  PAST MEDICAL HISTORY:  Positive for nonischemic cardiomyopathy with cardiac defibrillator, CHF, hypertension, diabetes type 2, TIA, PVD.  FAMILY HISTORY:  Positive for diabetes.  SOCIAL HISTORY:  The patient lives with a cousin in 1-level house with ramp.  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 Cousin is there intermittently.  He quit smoking in 2005 and occasionally drinks.  ALLERGIES:  Contrast dye.  MEDICATIONS AND LABS:  Please see written H and P.  PHYSICAL EXAMINATION:  VITAL SIGNS:  Blood pressure is 111/62, pulse 62, respiratory  rate 18, temperature 98.2. GENERAL:  The patient is pleasant, alert and oriented x3.  Affect is bright and appropriate. HEENT:  Nose and throat exam is notable for intact moist mucosa.  He had borderline dentition. NECK:  Supple without JVD or lymphadenopathy. CHEST:  Clear to auscultation bilaterally without wheezes, rales, or rhonchi. HEART:  Regular rate and rhythm without murmurs, rubs, gallops. ABDOMEN:  Soft, nontender.  Bowel sounds are positive. SKIN:  Notable for left below-knee incision with some slight drainage. Wound was well-approximated and leg was appropriately tender.  Right below-knee wound is well healed and leg is well shaped.  Skin otherwise notable for some chronic changes and scarring.  Otherwise, no breakdown was seen. NEUROLOGIC:  Notable for intact cranial nerve exam, 1+ reflexes, intact sensation grossly except for some slight diminishment in the distal upper extremities.  Cognitively, the patient is alert and appropriate. Cranial nerve exam is intact.  Strength is 4-5/5 in the upper extremities.  Left lower extremity, he has 3/5 hip flexion, 2+-3/5 knee extension and flexion with some extensor lag noted.  Right lower extremity is 4/5 at the hip and knee, 4+-5/5 at the upper extremity.  POSTADMISSION PHYSICIAN EVALUATION: 1. Functional deficit secondary to gangrenous changes in left lower     extremity necessitating a left below-knee amputation. 2. The patient is admitted to receive collaborative interdisciplinary     care between the physiatrist rehab nursing staff and therapy team. 3. The patient's level of medical complexity and substantial therapy     needs context of that medical necessity cannot be provided at     lesser intensity of care. 4. The patient has experienced substantial functional loss from his     baseline.  Premorbidly, he was independent with his prosthesis.     Currently, he is mod assist bed mobility, min-to-mod assist     transfers.   He can ambulate 70 feet with rolling walker, set up     upper body care, mod assist lower body care, and to don prosthesis.     He is min assist with toileting.  Judging by the patient's     diagnosis, physical exam, and functional history, he has potential     for functional progress which will result in measurable gains while     inpatient rehab.  These gains will be of substantial and practical     use upon discharge to home facilitating mobility and self care. 5. The physiatrist will provide 24-hour management of medical needs as     well as oversight of therapy plans/treatment and provide guidance     as appropriate regarding interaction of the two.  Medical problem     list and plan are below. 6. A 24-hour rehab nursing team will assist in management of the     patient's skin care needs as well as bowel and bladder function,     safety awareness, integration of therapy, concepts and techniques. 7. PT will assess and treat for lower extremity strength, range of     motion, short distance ambulation and wheelchair mobility,     transferring and preprosthetic education with goals modified     independent. 8. OT will assess and treat for upper extremity strength, ADLs,     functional mobility, adaptive equipment with goals modified     independent. 9. Case management and social worker will assess and treat for     psychosocial issues and discharge planning. 10.Team conference will be held weekly to assess progress towards     goals and to determine barriers at discharge. 11.The patient has demonstrated sufficient medical stability and     exercise capacity to tolerate at least 3 hours of therapy per day     at least 5 days per week. 12.Estimated length of stay is 1 week.  Prognosis is good.  MEDICAL PROBLEM LIST AND PLAN: 1. Add subcu Lovenox until discharge.  Follow platelets, although the     counts have been robust so far. 2. Pain management, p.r.n. analgesics.  The patient also  will work on massage and desentization to the limb.  He is familiar with     some techniques after his prior surgery. 3. Type 2 diabetes:  Continue Levemir and adjust NovoLog insulin for     better sugar control.  Stop metformin given the patient's renal     failure.  Most recent creatinine is 2.73. 4. Acute on chronic kidney disease:  See above.  Continue to make sure     the patient has adequate perfusion     pressure.  Follow labs serially.  Push p.o. intake and fluids.     Remove any suspect medications  as well. 5. Nonischemic cardiomyopathy:  Continue Coreg, Crestor, Plavix,     Lovaza.  I will follow regular weights as well as I's and O's.  The     patient denies any shortness of breath and exam was clear on     admission today. He will require additional energy expenditure for mobility---the rehab team will monitor his tolerance closely.     Meredith Staggers, M.D.     ZTS/MEDQ  D:  11/17/2010  T:  11/18/2010  Job:  [id number]  cc:   Angus G. Everette Rank, MD Newt Minion, MD  Electronically Signed by Alger Simons M.D. on 11/30/2010 05:56:23 PM

## 2010-12-22 ENCOUNTER — Encounter
Payer: BC Managed Care – PPO | Attending: Physical Medicine & Rehabilitation | Admitting: Physical Medicine & Rehabilitation

## 2010-12-22 DIAGNOSIS — I70269 Atherosclerosis of native arteries of extremities with gangrene, unspecified extremity: Secondary | ICD-10-CM

## 2010-12-22 DIAGNOSIS — S88119A Complete traumatic amputation at level between knee and ankle, unspecified lower leg, initial encounter: Secondary | ICD-10-CM | POA: Insufficient documentation

## 2010-12-22 NOTE — Discharge Summary (Signed)
NAMEHOVANNES, MCMANIGAL NO.:  [id number]  MEDICAL RECORD NO.:  [id number]  LOCATION:  4008                         FACILITY:  [facility]  PHYSICIAN:  Meredith Staggers, M.D.DATE OF BIRTH:  July 14, 1955  DATE OF ADMISSION:  11/17/2010 DATE OF DISCHARGE:  11/25/2010                              DISCHARGE SUMMARY   DISCHARGE DIAGNOSES: 1. Gangrenous changes with osteomyelitis, left foot, requiring left     below-knee amputation. 2. Acute on chronic renal failure. 3. Anemia of chronic disease. 4. Hypertension. 5. Diabetes mellitus type 2. 6. Nonischemic cardiomyopathy.  HISTORY OF PRESENT ILLNESS:  Mr. Spector is a 55 year old African American male with history of diabetes mellitus, peripheral vascular disease, right BKA in July 2010.  The patient with first toe amputation in July 2012, with further gangrenous changes and foot with osteomyelitis.  He was admitted on November 03, 2010, for left BKA by Dr. Sharol Given, postop with acute renal failure with creatinine peaking to 2.7.  The patient also with acute on chronic anemia with hemoglobin dropping down to 6.2 and he has been treated with 2 units of packed red blood cells.  Therapies initiated and the patient is noted to have decreased balance with loss of balance with ambulation, requires cue for safety.  The patient was evaluated by Rehab and we felt that he would benefit from a CIR Program.  PAST MEDICAL HISTORY:  Significant for nonischemic cardiomyopathy with cardiac defibrillator in place, CHF, hypertension, DM type 2, history of TIA and peripheral vascular disease.  ALLERGIES:  CONTRAST DYE.  REVIEW OF SYMPTOMS:  Positive for wound care issues, variable p.o. intake which is improving.  FAMILY HISTORY:  Positive for diabetes.  SOCIAL HISTORY:  The patient lives with a cousin in 1-level home with ramp at entry.  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 Cousin works nights and is very intermittently.   The patient quit tobacco in 2005.  He has an occasional alcoholic drink.  PHYSICAL EXAMINATION:  VITAL SIGNS:  Blood pressure 111/62, pulse 62, respiratory rate 18, temperature 98.2. GENERAL:  The patient is pleasant male, alert and oriented x3, in no acute distress. HEENT:  Oral mucosa is pink and moist with borderline dentition. Hearing intact.  Nares patent. NECK:  Supple without JVD or lymphadenopathy. LUNGS:  Clear to auscultation bilaterally without wheezes, rales, or rhonchi. HEART:  Shows regular rate rhythm without murmurs, gallops, or rubs. ABDOMEN:  Soft, nontender with positive bowel sounds. SKIN:  Notable for left BKA incision with slight drainage.  Wound is well approximated.  Right BKA wound is well healed and leg is well shaped.  His skin otherwise notable for some chronic changes and scarring.  No breakdown seen. NEUROLOGIC:  Cranial nerves II-XII intact.  Reflexes 1+.  Sensation grossly intact except for some slight diminishment in the distal upper extremities.  The patient is alert and appropriate.  Strength is 4- to 5 out of 5 upper extremity and left lower extremity.  He has 3/5 hip flexion, 2+ to 3- out of 5 at knee flexion and extension with some extension lag noted.  Right lower extremity is 4/5 at hip and knee  flexures.  Bilateral upper extremities 4+ to 5- out of five.  HOSPITAL COURSE:  Mr. Adarious Shamel was admitted to Rehab on November 17, 2010, for inpatient therapies to consist of PT/OT at least 3 hours, 5 days a week.  Past admission Physiatrist, Rehab RN, and Therapy Team have worked together to provide customized collaborative interdisciplinary care.  The patient's blood pressure were checked on b.i.d. basis and these were noted to be reasonably controlled.  Diabetes was monitored on a.c., at bedtime basis and the patient was noted to have issues with hypoglycemia.  His Levemir has been titrated to 40 units subcu b.i.d. to prevent hypoglycemic episodes.   He diabetic dietary education has been done by dietitian as well as nursing and the patient has been educated regarding importance of 3 meals a day and consistent amounts at mealtimes.  The patient's renal status has been monitored during this stay.  He was treated with gentle hydration due to his issues with nonischemic cardiomyopathy.  Labs done at admission revealed BUN at 107, creatinine at 3.01.  IV fluids were maintained until 48 hours prior to discharge.  Last check of labs was November 25, 2010, shows improvement with sodium at 144, potassium 3.9, chloride 104, CO2 29, BUN 64, creatinine at 2.41, glucose 81.  The patient will need recheck lytes done by primary care physician and follow up with Nephrology for further input regarding his chronic kidney disease.  CBC done past admission revealed H and H at 9.0 and 27.7, white count 7.6, platelets 221.  UA, UC done was negative.  Blood sugars at the time of discharge are ranging from 51-124 range.  The patient advised to keep checking blood sugars on a.c. and at bedtime. He is to follow up with primary care regarding further adjustment in his diabetic regimen.  The patient's BKA site is noted to be healing well without any signs or symptoms of infection.  He is to continue with single layer of shrinker on his BKA site for edema control.  During the patient's stay in rehab, weekly team conferences were held to monitor the patient's progress, set goals, as well as discuss barriers to discharge.  At the time of admission, the patient was noted to have decrease in functional mobility with decreased activity tolerance, decreased strength, as well as difficulty walking.  He has progressed along to being at modified independent at wheelchair level.  He requires min assist for gait with rolling walker and prosthesis.  He is independent for donning and doffing prosthesis.  He demonstrate good safety awareness and further followup home health  therapy to continue to help the patient with progress toward functional independence goals.  OT has worked with the patient on self-care tasks.  They have worked on overall activity tolerance as well as sitting balance.  At admission, the patient was supervision to min assist for bathing and dressing.  He has progressed along to being at modified independent for ADL tasks and no further followup OT is recommended at this time.  He has tub transfer bench which he is to continue using for his showers.  On November 25, 2010, the patient discharged to home.  DISCHARGE MEDICATIONS: 1. Tylenol 325-650 mg p.o. q.4 hours p.r.n. pain. 2. Fenofibrate 160 mg p.o. per day. 3. Ferrous sulfate 325 mg b.i.d. 4. NovoLog insulin 10 units subcu t.i.d. with meals. 5. Levemir 40 units subcu b.i.d. 6. Robaxin 500 mg p.o. q.6 hours p.r.n. spasms. 7. OxyIR 5 mg 1-2 p.o. q.4 hours  p.r.n. pain. 8. Nephro-Vite 1 p.o. per day. 9. Amlodipine 10 mg 1/2 p.o. per day. 10.Aspirin 81 mg per day. 11.Coreg 25 mg b.i.d. 12.Clonidine 0.1 mg p.o. b.i.d. 13.Crestor 20 mg p.o. q.p.m. 14.Lasix 20 mg p.o. b.i.d. 15.Lovaza 2 caps p.o. b.i.d. 16.Lyrica 100 mg p.o. b.i.d. 17.Tribenzor 20/12.5/5 one p.o. b.i.d. 18.Plavix 75 mg p.o. per day. 19.K-Dur 20 mEq b.i.d. 20.Spirolactone 25 mg p.o. b.i.d. 21.Trilipix 135 mg p.o. per day. 22.Vitamin B12 1 p.o. per day.  DIET:  Carb-modified medium.  ACTIVITY LEVEL:  Independent at wheelchair level.  FOLLOWUP:  The patient is to follow up with Dr. Naaman Plummer on December 22, 2010, at 8:45 for 9:15 a.m.  Follow up with Dr. Marjean Donna in 1-2 weeks.  Follow up with Dr. Sharol Given in 2 weeks for postop check.     Reesa Chew, P.A.   ______________________________ Meredith Staggers, M.D.    PL/MEDQ  D:  11/29/2010  T:  11/29/2010  Job:  [id number]  cc:   Angus G. Everette Rank, MD Newt Minion, MD  Electronically Signed by Joline Maxcy. on 12/02/2010 03:53:56 PM Electronically  Signed by Alger Simons M.D. on 12/22/2010 09:48:29 AM

## 2010-12-22 NOTE — Assessment & Plan Note (Signed)
HISTORY:  Mr. Hy is back regarding his left below-knee amputation. He was discharged from rehab late last month.  He is now at home.  He seen Dr. Sharol Given, once.  Left leg is healing nicely.  There was still some slight drainage.  Wearing a dry dressing and stump shrinker regularly.  REVIEW OF SYSTEMS:  Notable for the above.  Full 12-point review is in the written health and history section of the chart.  SOCIAL HISTORY:  Unchanged.  PHYSICAL EXAMINATION:  VITAL SIGNS:  Blood pressure 122/71, pulse 72, respiratory rate 16, and satting 96% on room air. GENERAL:  The patient is alert and oriented today. MUSCULOSKELETAL:  Left leg has full active and passive flexion/extension.  Edema is really absent in the leg now.  The leg itself along the incision line has some scabbing in 2 open areas, one of which has a bit of hypergranulation, but this is minimal.  He has full flexion and extension of the hip.  He is transferring nicely on his prosthetic limb.  I did disclose an area on his right leg that seemed to be decreased to pinprick and light touch over the anterolateral thigh. There is no weakness, however, with extension and flexion, etc., of the hip and knee.  Upper extremity strength is 5/5.  ASSESSMENT: 1. History of right below-knee amputation with prosthesis. 2. New left below-knee prosthesis with wound healing still on     progress. 3. Likely right meralgia paresthetica due to weight gain and prolonged     sitting.  PLAN: 1. Recommend standing and stretching of the abdomen as well as weight     loss.  He gets moving more on a prosthetic limb this should improve     also. 2. I suspect he is about a month away from fitting of the prosthesis.     I would give him at least 2-3 more weeks before the casting process     starts to give the wound areas further time for healing.  For now,     continue with dry dressing and observation.  The limb shape itself     is excellent.  It should  be K3 ambulator. 3. I will see him back as needed in the future.  Call with any     problems or questions.  Pain is minimal at this point.     Meredith Staggers, M.D. Electronically Signed    ZTS/MedQ D:  12/22/2010 10:42:21  T:  12/22/2010 13:57:16  Job #:  [id number]  cc:   Angus G. Everette Rank, MD Fax: [phone]

## 2011-02-15 DIAGNOSIS — I739 Peripheral vascular disease, unspecified: Secondary | ICD-10-CM

## 2011-03-01 ENCOUNTER — Other Ambulatory Visit: Payer: Self-pay | Admitting: Internal Medicine

## 2011-03-02 ENCOUNTER — Other Ambulatory Visit: Payer: Self-pay | Admitting: Internal Medicine

## 2011-03-02 MED ORDER — CARVEDILOL 25 MG PO TABS: 25.0000 mg | ORAL_TABLET | Freq: Two times a day (BID) | ORAL | Status: DC

## 2011-03-30 ENCOUNTER — Encounter: Payer: Self-pay | Admitting: Cardiology

## 2011-04-18 ENCOUNTER — Encounter: Payer: Self-pay | Admitting: *Deleted

## 2011-05-04 ENCOUNTER — Other Ambulatory Visit: Payer: Self-pay

## 2011-05-04 ENCOUNTER — Other Ambulatory Visit: Payer: Self-pay | Admitting: Internal Medicine

## 2011-05-04 MED ORDER — CLONIDINE HCL 0.1 MG PO TABS: 0.1000 mg | ORAL_TABLET | Freq: Two times a day (BID) | ORAL | Status: DC

## 2011-05-27 ENCOUNTER — Other Ambulatory Visit: Payer: Self-pay

## 2011-05-27 MED ORDER — CLONIDINE HCL 0.1 MG PO TABS: 0.1000 mg | ORAL_TABLET | Freq: Two times a day (BID) | ORAL | Status: DC

## 2011-06-13 ENCOUNTER — Other Ambulatory Visit: Payer: Self-pay | Admitting: Internal Medicine

## 2011-08-06 ENCOUNTER — Emergency Department (HOSPITAL_COMMUNITY)
Admission: EM | Admit: 2011-08-06 | Discharge: 2011-08-06 | Disposition: A | Discharge status: Home / Self Care | Payer: BC Managed Care – PPO | Attending: Emergency Medicine | Admitting: Emergency Medicine

## 2011-08-06 ENCOUNTER — Encounter (HOSPITAL_COMMUNITY): Payer: Self-pay

## 2011-08-06 DIAGNOSIS — I129 Hypertensive chronic kidney disease with stage 1 through stage 4 chronic kidney disease, or unspecified chronic kidney disease: Secondary | ICD-10-CM | POA: Insufficient documentation

## 2011-08-06 DIAGNOSIS — N179 Acute kidney failure, unspecified: Secondary | ICD-10-CM | POA: Insufficient documentation

## 2011-08-06 DIAGNOSIS — Z8673 Personal history of transient ischemic attack (TIA), and cerebral infarction without residual deficits: Secondary | ICD-10-CM | POA: Insufficient documentation

## 2011-08-06 DIAGNOSIS — R739 Hyperglycemia, unspecified: Secondary | ICD-10-CM

## 2011-08-06 DIAGNOSIS — N189 Chronic kidney disease, unspecified: Secondary | ICD-10-CM | POA: Insufficient documentation

## 2011-08-06 DIAGNOSIS — E785 Hyperlipidemia, unspecified: Secondary | ICD-10-CM | POA: Insufficient documentation

## 2011-08-06 DIAGNOSIS — E119 Type 2 diabetes mellitus without complications: Secondary | ICD-10-CM | POA: Insufficient documentation

## 2011-08-06 DIAGNOSIS — F17200 Nicotine dependence, unspecified, uncomplicated: Secondary | ICD-10-CM | POA: Insufficient documentation

## 2011-08-06 DIAGNOSIS — S88119A Complete traumatic amputation at level between knee and ankle, unspecified lower leg, initial encounter: Secondary | ICD-10-CM | POA: Insufficient documentation

## 2011-08-06 DIAGNOSIS — N19 Unspecified kidney failure: Secondary | ICD-10-CM

## 2011-08-06 LAB — BASIC METABOLIC PANEL
BUN: 102 mg/dL — ABNORMAL HIGH (ref 6–23)
CO2: 30 mEq/L (ref 19–32)
Calcium: 9.9 mg/dL (ref 8.4–10.5)
Creatinine, Ser: 3.46 mg/dL — ABNORMAL HIGH (ref 0.50–1.35)
GFR calc non Af Amer: 18 mL/min — ABNORMAL LOW (ref >90)
Glucose, Bld: 227 mg/dL — ABNORMAL HIGH (ref 70–99)
Sodium: 139 mEq/L (ref 135–145)

## 2011-08-06 LAB — GLUCOSE, CAPILLARY: Glucose-Capillary: 199 mg/dL — ABNORMAL HIGH (ref 70–99)

## 2011-08-06 NOTE — ED Notes (Signed)
Pt c/o high blood sugar, pt states that his blood sugar has been up and down for the past week, pt advises that his blood sugar was reading 289 at home prior to coming to er, pt blood sugar level 199 on arrival to er, pt advises that he did not have any treatment of the 289 blood sugar. Pt states "it has been awhile since I checked my meter",

## 2011-08-06 NOTE — ED Provider Notes (Signed)
History  This chart was scribed for [name] Cable, MD by Jenne Campus. This patient was seen in room APA12/APA12 and the patient's care was started at 11:41AM.  CSN: [id number]  Arrival date & time 08/06/11  1137   First MD Initiated Contact with Patient 08/06/11 1141      Chief Complaint  Patient presents with  . Hyperglycemia    The history is provided by the patient. No language interpreter was used.    [name] is a 56 y.o. male with a h/o diabetes who presents to the Emergency Department complaining of one week of gradual onset, non-changing, constant hyperglycemia. He measured his CBG at 554 its highest and at 233 at its lowest. He checked his CBG at 288 today. He reports that he is not taking Levemir due to insurance but is taking his other medications as prescribed. He denies any modifying factors. He denies emesis, diarrhea, chest pain, abdominal pain, fever or cough as associated symptoms. He denies kidney problems and denies having diaylsis. He has a h/o HTN, PVD and TIA. He is a former smoker and occasional alcohol user.    Past Medical History  Diagnosis Date  . Nonischemic cardiomyopathy   . Hyperlipidemia   . Hypertension   . Diabetes mellitus   . Tobacco abuse   . Cerebrovascular disease   . History of TIAs   . PVD (peripheral vascular disease)   . Amputated below knee     Right  . Skin disease     Rare    Past Surgical History  Procedure Date  . Cardiac defibrillator placement   . Leg amputation below knee     Right    Family History  Problem Relation Age of Onset  . Heart failure Mother   . Diabetes Mother   . Diabetes Father   . Stroke Father   . Diabetes Brother   . Diabetes Brother   . Diabetes Brother   . Diabetes Brother   . Diabetes Other   . Coronary artery disease Other     History  Substance Use Topics  . Smoking status: Former Smoker -- 1.0 packs/day for 20 years  . Smokeless tobacco: Not on file  . Alcohol Use: Yes       Occasional      Review of Systems  A complete 10 system review of systems was obtained and all systems are negative except as noted in the HPI and PMH.   Allergies  Review of patient's allergies indicates not on file.  Home Medications   Current Outpatient Rx  Name Route Sig Dispense Refill  . AMLODIPINE BESYLATE 10 MG PO TABS Oral Take 10 mg by mouth daily.      . ASPIRIN 81 MG PO TABS Oral Take 81 mg by mouth daily.      Marland Kitchen CARVEDILOL 25 MG PO TABS Oral Take 1 tablet (25 mg total) by mouth 2 (two) times daily. 60 tablet 0    Pt must have office visit for additional refills  . CHOLINE FENOFIBRATE 135 MG PO CPDR Oral Take 135 mg by mouth daily.      Marland Kitchen CLONIDINE HCL 0.1 MG PO TABS  take 1 tablet by mouth twice a day 60 tablet 5  . CLOPIDOGREL BISULFATE 75 MG PO TABS Oral Take 75 mg by mouth daily.      . FUROSEMIDE 20 MG PO TABS Oral Take 20 mg by mouth 3 (three) times daily.      Marland Kitchen  INSULIN DETEMIR 100 UNIT/ML [place] SOLN Subcutaneous Inject into the skin 2 (two) times daily.      . INSULIN GLULISINE 100 UNIT/ML [place] SOLN Subcutaneous Inject into the skin as directed.      Marland Kitchen METFORMIN HCL 500 MG PO TABS Oral Take 500 mg by mouth 2 (two) times daily with a meal.      . OMEGA-3-ACID ETHYL ESTERS 1 G PO CAPS Oral Take 1 capsule by mouth 2 (two) times daily.      Marland Kitchen POTASSIUM CHLORIDE 20 MEQ PO PACK Oral Take 20 mEq by mouth 2 (two) times daily.      Marland Kitchen PREGABALIN 100 MG PO CAPS Oral Take 100 mg by mouth 2 (two) times daily.      Marland Kitchen VITAMIN B-12 1000 MCG PO TABS Oral Take 1,000 mcg by mouth daily.        Triage Vitals: BP 134/78  Pulse 81  Temp(Src) 98.7 F (37.1 C) (Oral)  Resp 20  Ht 6' (1.829 m)  Wt 260 lb (117.935 kg)  BMI 35.26 kg/m2  SpO2 97%  Physical Exam  Nursing note and vitals reviewed.  CONSTITUTIONAL: Well developed/well nourished HEAD AND FACE: Normocephalic/atraumatic EYES: EOMI/PERRL ENMT: Mucous membranes moist NECK: supple no meningeal signs SPINE:entire  spine nontender CV: S1/S2 noted, no murmurs/rubs/gallops noted LUNGS: Lungs are clear to auscultation bilaterally, no apparent distress ABDOMEN: soft, nontender, no rebound or guarding GU:no cva tenderness NEURO: Pt is awake/alert, moves all extremitiesx4 EXTREMITIES: pulses normal,  bilateral BKA SKIN: warm, color normal PSYCH: no abnormalities of mood noted  ED Course  Procedures (including critical care time)  DIAGNOSTIC STUDIES: Oxygen Saturation is 97% on room air, adequate by my interpretation.    COORDINATION OF CARE: 12:01PM-Advised pt that CBG is 199. Discussed discharge with pt and advised pt to follow up with his PCP within the next week. Pt is comfortable being discharged home. Acute on chronic renal failure.  Can f/u later this week, continue his meds as scheduled  The patient appears reasonably screened and/or stabilized for discharge and I doubt any other medical condition or other [facility] requiring further screening, evaluation, or treatment in the ED at this time prior to discharge.   Results for orders placed during the hospital encounter of 08/06/11  GLUCOSE, CAPILLARY      Component Value Range   Glucose-Capillary 199 (*) 70 - 99 (mg/dL)   Comment 1 Notify RN     Comment 2 Documented in Chart    BASIC METABOLIC PANEL      Component Value Range   Sodium 139  135 - 145 (mEq/L)   Potassium 4.1  3.5 - 5.1 (mEq/L)   Chloride 94 (*) 96 - 112 (mEq/L)   CO2 30  19 - 32 (mEq/L)   Glucose, Bld 227 (*) 70 - 99 (mg/dL)   BUN PENDING  6 - 23 (mg/dL)   Creatinine, Ser 3.46 (*) 0.50 - 1.35 (mg/dL)   Calcium 9.9  8.4 - 10.5 (mg/dL)   GFR calc non Af Amer 18 (*) >90 (mL/min)   GFR calc Af Amer 21 (*) >90 (mL/min)     MDM  Nursing notes reviewed and considered in documentation All labs/vitals reviewed and considered Previous records reviewed and considered    Date: 08/06/2011  Rate: 73  Rhythm: normal sinus rhythm  QRS Axis: left  Intervals: normal  ST/T Wave  abnormalities: nonspecific ST changes and LVH  Conduction Disutrbances:nonspecific intraventricular conduction delay  Narrative Interpretation:   Old EKG Reviewed:  unchanged     I personally performed the services described in this documentation, which was scribed in my presence. The recorded information has been reviewed and considered.      [name] Cable, MD 08/06/11 [phone]

## 2011-08-06 NOTE — Discharge Instructions (Signed)
Diabetes, Frequently Asked Questions  WHAT IS DIABETES?  Most of the food we eat is turned into glucose (sugar). Our bodies use it for energy. The pancreas makes a hormone called insulin. It helps glucose get into the cells of our bodies. When you have diabetes, your body either does not make enough insulin or cannot use its own insulin as well as it should. This causes sugars to build up in your blood.  WHAT ARE THE SYMPTOMS OF DIABETES?   Frequent urination.   Excessive thirst.   Unexplained weight loss.   Extreme hunger.   Blurred vision.   Tingling or numbness in hands or feet.   Feeling very tired much of the time.   Dry, itchy skin.   Sores that are slow to heal.   Yeast infections.  WHAT ARE THE TYPES OF DIABETES?  Type 1 Diabetes    About 10% of affected people have this type.   Usually occurs before the age of 30.   Usually occurs in thin to normal weight people.  Type 2 Diabetes   About 90% of affected people have this type.   Usually occurs after the age of 40.   Usually occurs in overweight people.   More likely to have:   A family history of diabetes.   A history of diabetes during pregnancy (gestational diabetes).   High blood pressure.   High cholesterol and triglycerides.  Gestational Diabetes   Occurs in about 4% of pregnancies.   Usually goes away after the baby is born.   More likely to occur in women with:   Family history of diabetes.   Previous gestational diabetes.   Obese.   Over 25 years old.  WHAT IS PRE-DIABETES?  Pre-diabetes means your blood glucose is higher than normal, but lower than the diabetes range. It also means you are at risk of getting type 2 diabetes and heart disease. If you are told you have pre-diabetes, have your blood glucose checked again in 1 to 2 years.  WHAT IS THE TREATMENT FOR DIABETES?  Treatment is aimed at keeping blood glucose near normal levels at all times. Learning how to manage this yourself is important in treating diabetes.  Depending on the type of diabetes you have, your treatment will include one or more of the following:   Monitoring your blood glucose.   Meal planning.   Exercise.   Oral medicine (pills) or insulin.  CAN DIABETES BE PREVENTED?  With type 1 diabetes, prevention is more difficult, because the triggers that cause it are not yet known.  With type 2 diabetes, prevention is more likely, with lifestyle changes:   Maintain a healthy weight.   Eat healthy.   Exercise.  IS THERE A CURE FOR DIABETES?  No, there is no cure for diabetes. There is a lot of research going on that is looking for a cure, and progress is being made. Diabetes can be treated and controlled. People with diabetes can manage their diabetes and lead normal, active lives.  SHOULD I BE TESTED FOR DIABETES?  If you are at least 58 years old, you should be tested for diabetes. You should be tested again every 3 years. If you are 45 or older and overweight, you may want to get tested more often. If you are younger than 45, overweight, and have one or more of the following risk factors, you should be tested:   Family history of diabetes.   Inactive lifestyle.   High blood pressure.    WHAT ARE SOME OTHER SOURCES FOR INFORMATION ON DIABETES?  The following organizations may help in your search for more information on diabetes:  National Diabetes Education Program (NDEP)  Internet: http://www.ndep.nih.gov/resources  American Diabetes Association  Internet: http://www.diabetes.org   Juvenile Diabetes Foundation International  Internet: http://www.jdf.org  Document Released: 03/24/2003 Document Revised: 03/10/2011 Document Reviewed: 01/16/2009  ExitCare Patient Information 2012 [employer].

## 2011-08-06 NOTE — ED Notes (Signed)
Pt reports blood sugar had been as high as 554 and as low as 233 over the past week.  Denies any symptoms.  Says has a salty taste in his mouth.  Alert and oriented, denies pain.

## 2011-08-29 ENCOUNTER — Telehealth: Payer: Self-pay | Admitting: Internal Medicine

## 2011-08-29 NOTE — Telephone Encounter (Signed)
08-29-11 CALLED PT RE PAST DUE PACEMAKER CHECK, PT STATES HE HAD BOTH LEGS AMPUTATED, AND HAS TROUBLE GETTING AROUND RIGHT NOW, WILL CALL us BACK TO SET UP AN APPT/MT

## 2012-01-24 ENCOUNTER — Telehealth: Payer: Self-pay | Admitting: Internal Medicine

## 2012-01-24 NOTE — Telephone Encounter (Signed)
01-24-12 lmm @ 442pm for pt to call to set up past due device check/mt

## 2012-04-12 ENCOUNTER — Encounter: Payer: Self-pay | Admitting: *Deleted

## 2012-08-20 IMAGING — CR DG CHEST 2V
2 series · 2 of 2 positions shown · non-contrast
Comparison: Chest x-ray 10/16/2009.

CLINICAL DATA: Shortness of breath.

CHEST - 2 VIEW

[view not recorded (1 of 2)]
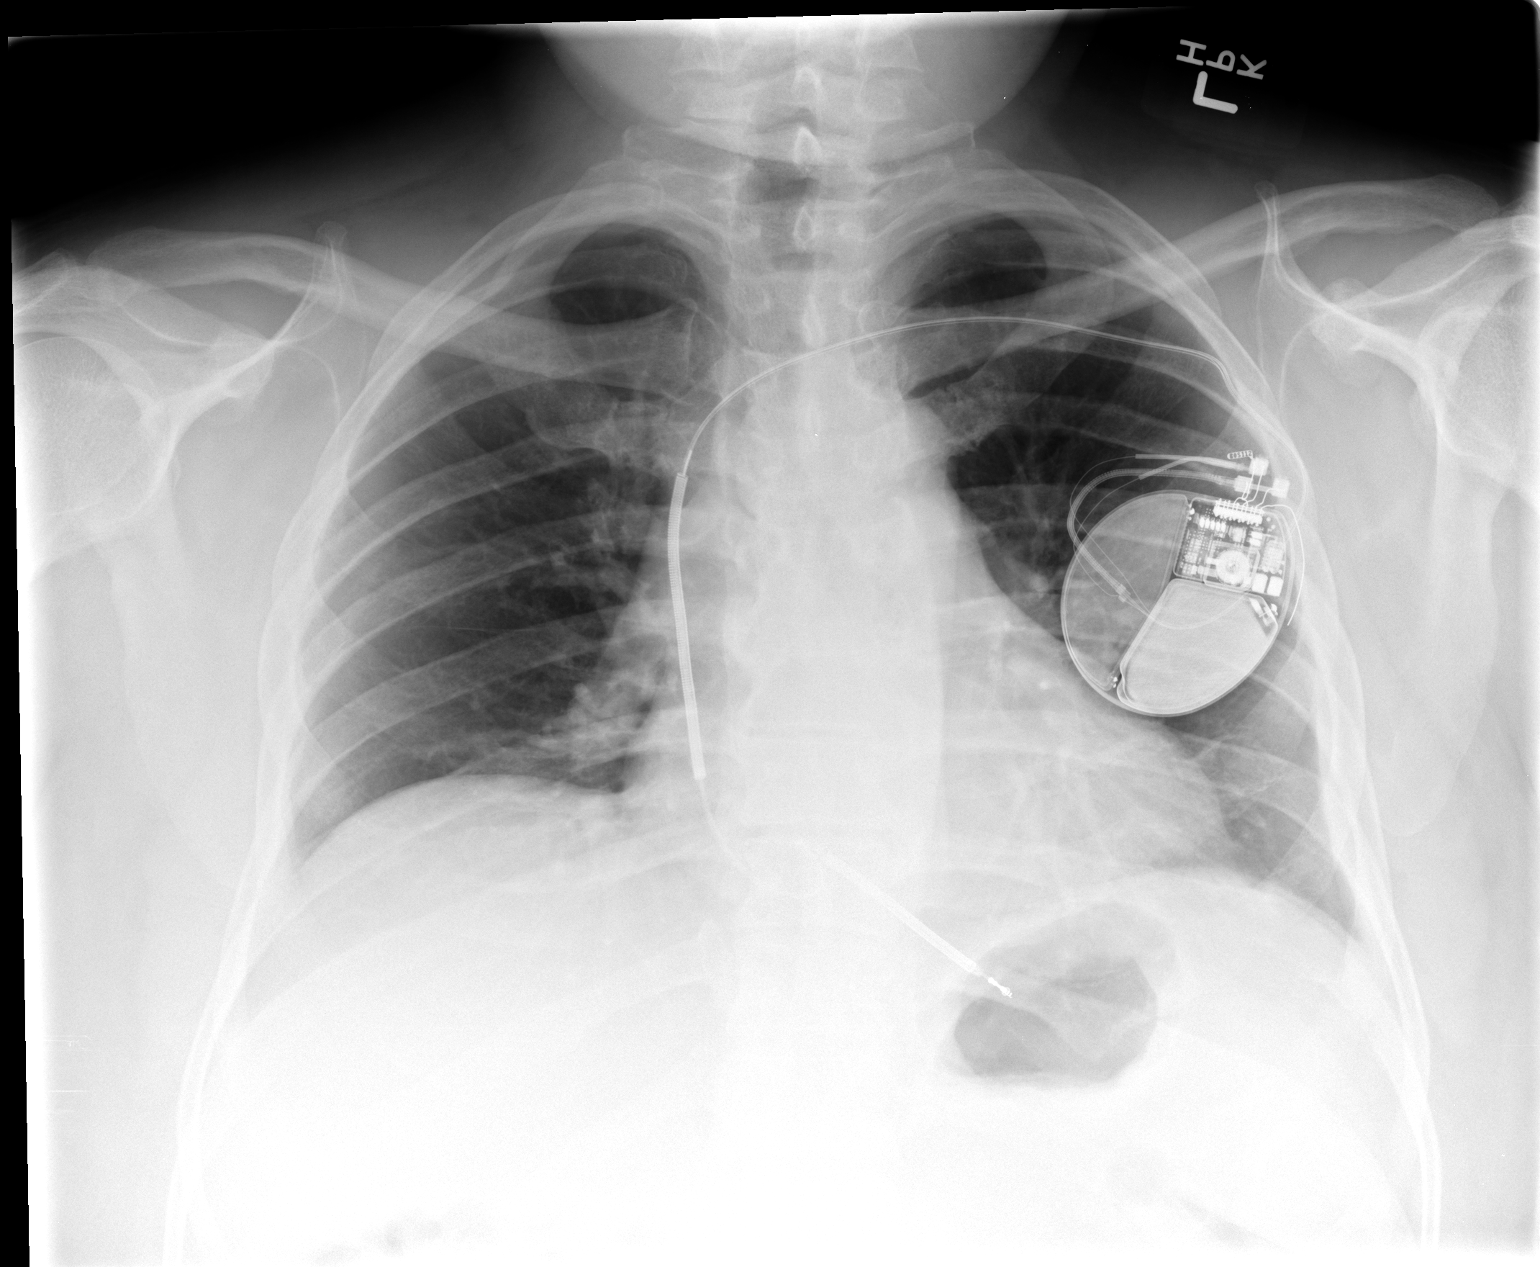

[view not recorded (2 of 2)]
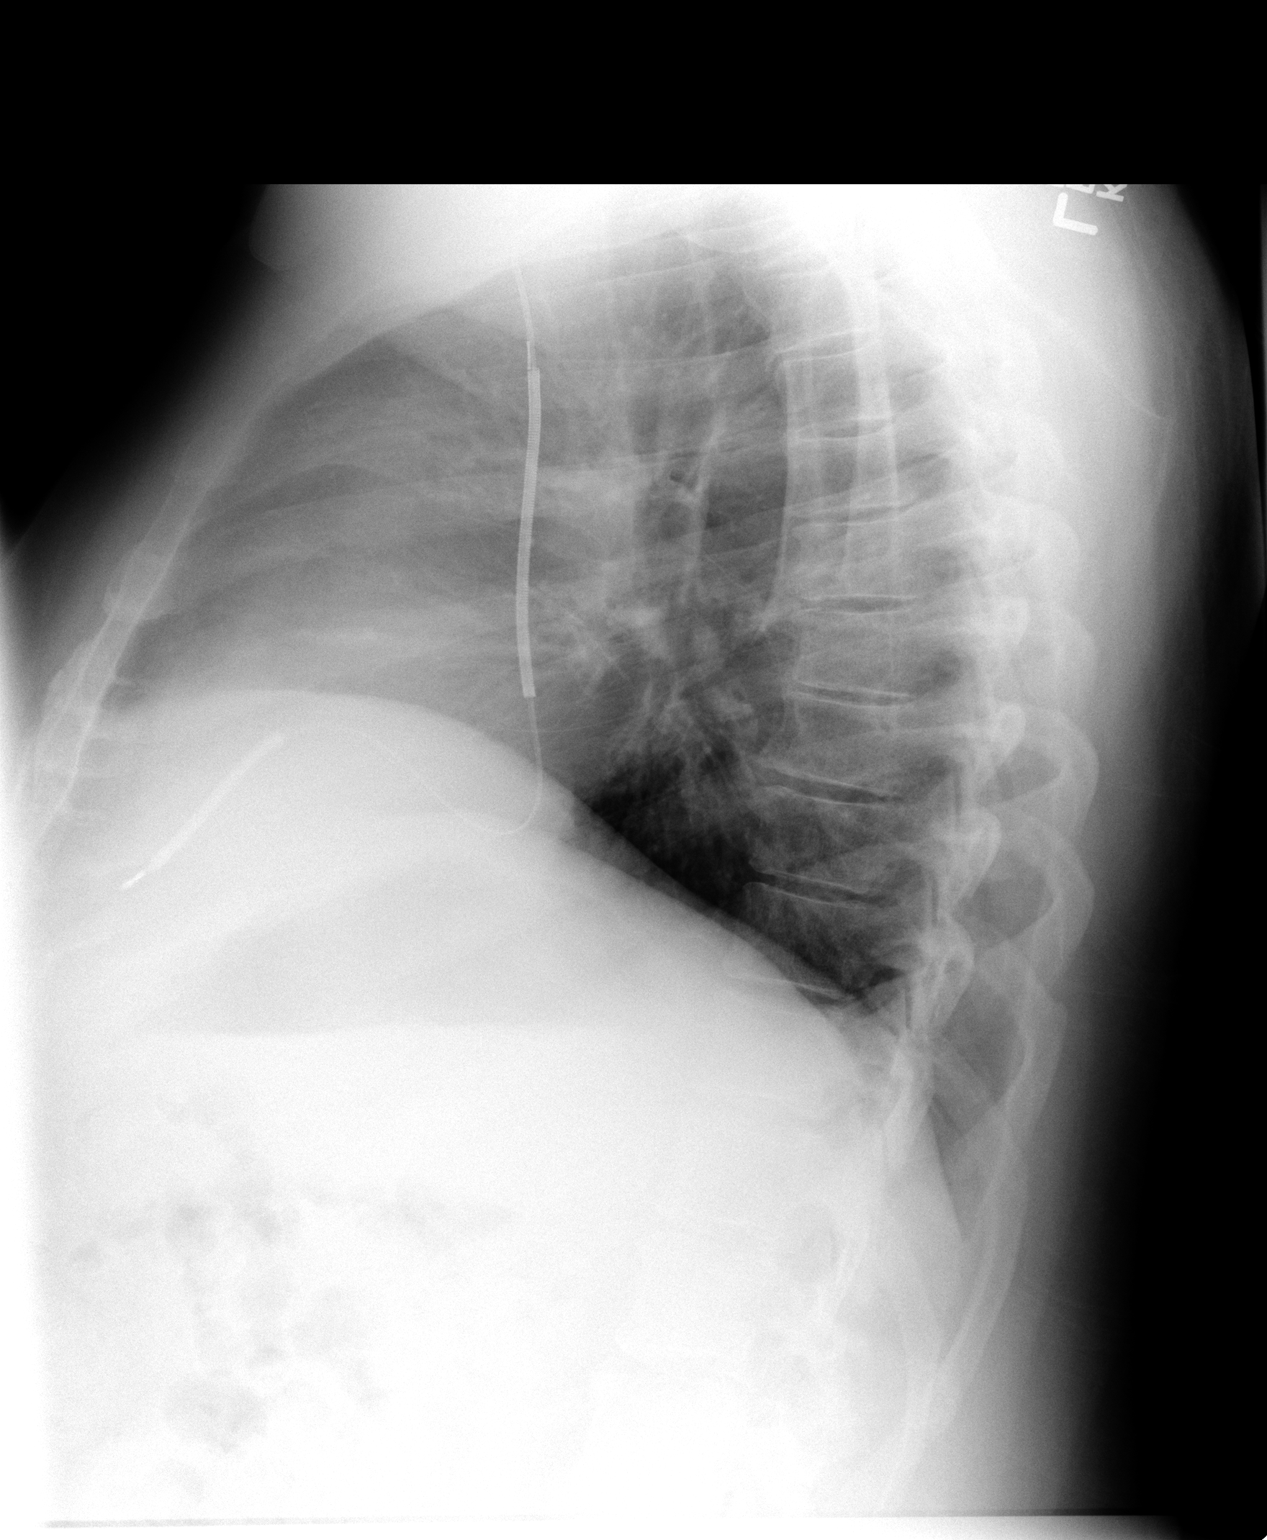

[2 of 2 positions shown; findings below may reference images not displayed]

FINDINGS: The AICD is stable.  The cardiac silhouette, mediastinal
and hilar contours are within normal limits.  The lungs are clear.
Minimal streaky basilar atelectasis.
IMPRESSION: Minimal streaky bibasilar atelectasis.

## 2014-01-21 ENCOUNTER — Other Ambulatory Visit (HOSPITAL_COMMUNITY): Payer: Self-pay | Admitting: Family Medicine

## 2014-01-21 DIAGNOSIS — N184 Chronic kidney disease, stage 4 (severe): Secondary | ICD-10-CM

## 2014-01-24 ENCOUNTER — Ambulatory Visit (HOSPITAL_COMMUNITY)
Admission: RE | Admit: 2014-01-24 | Discharge: 2014-01-24 | Disposition: A | Discharge status: Home / Self Care | Payer: Medicare Other | Source: Ambulatory Visit | Attending: Family Medicine | Admitting: Family Medicine

## 2014-01-24 DIAGNOSIS — N281 Cyst of kidney, acquired: Secondary | ICD-10-CM | POA: Insufficient documentation

## 2014-01-24 DIAGNOSIS — N184 Chronic kidney disease, stage 4 (severe): Secondary | ICD-10-CM

## 2015-01-14 ENCOUNTER — Other Ambulatory Visit (HOSPITAL_COMMUNITY): Payer: Self-pay | Admitting: Nephrology

## 2015-01-14 DIAGNOSIS — N189 Chronic kidney disease, unspecified: Secondary | ICD-10-CM

## 2015-01-19 ENCOUNTER — Ambulatory Visit (HOSPITAL_COMMUNITY)
Admission: RE | Admit: 2015-01-19 | Discharge: 2015-01-19 | Disposition: A | Discharge status: Home / Self Care | Payer: Medicare Other | Source: Ambulatory Visit | Attending: Nephrology | Admitting: Nephrology

## 2015-01-19 DIAGNOSIS — N281 Cyst of kidney, acquired: Secondary | ICD-10-CM | POA: Insufficient documentation

## 2015-01-19 DIAGNOSIS — N189 Chronic kidney disease, unspecified: Secondary | ICD-10-CM | POA: Insufficient documentation

## 2015-01-30 ENCOUNTER — Other Ambulatory Visit: Payer: Self-pay

## 2015-01-30 ENCOUNTER — Encounter: Payer: Self-pay | Admitting: Vascular Surgery

## 2015-01-30 DIAGNOSIS — Z01810 Encounter for preprocedural cardiovascular examination: Secondary | ICD-10-CM

## 2015-01-30 DIAGNOSIS — N184 Chronic kidney disease, stage 4 (severe): Secondary | ICD-10-CM

## 2015-03-09 ENCOUNTER — Encounter: Payer: Self-pay | Admitting: Vascular Surgery

## 2015-03-10 ENCOUNTER — Ambulatory Visit (INDEPENDENT_AMBULATORY_CARE_PROVIDER_SITE_OTHER)
Admission: RE | Admit: 2015-03-10 | Discharge: 2015-03-10 | Disposition: A | Discharge status: Home / Self Care | Payer: Medicare Other | Source: Ambulatory Visit | Attending: Vascular Surgery | Admitting: Vascular Surgery

## 2015-03-10 ENCOUNTER — Encounter: Payer: Self-pay | Admitting: Vascular Surgery

## 2015-03-10 ENCOUNTER — Ambulatory Visit (HOSPITAL_COMMUNITY)
Admission: RE | Admit: 2015-03-10 | Discharge: 2015-03-10 | Disposition: A | Discharge status: Home / Self Care | Payer: Medicare Other | Source: Ambulatory Visit | Attending: Vascular Surgery | Admitting: Vascular Surgery

## 2015-03-10 ENCOUNTER — Ambulatory Visit (INDEPENDENT_AMBULATORY_CARE_PROVIDER_SITE_OTHER): Payer: Medicare Other | Admitting: Vascular Surgery

## 2015-03-10 VITALS — BP 158/78 | HR 74 | Temp 98.8°F | Resp 18 | Wt 260.0 lb

## 2015-03-10 DIAGNOSIS — I129 Hypertensive chronic kidney disease with stage 1 through stage 4 chronic kidney disease, or unspecified chronic kidney disease: Secondary | ICD-10-CM | POA: Insufficient documentation

## 2015-03-10 DIAGNOSIS — Z01810 Encounter for preprocedural cardiovascular examination: Secondary | ICD-10-CM | POA: Insufficient documentation

## 2015-03-10 DIAGNOSIS — E785 Hyperlipidemia, unspecified: Secondary | ICD-10-CM | POA: Insufficient documentation

## 2015-03-10 DIAGNOSIS — F17200 Nicotine dependence, unspecified, uncomplicated: Secondary | ICD-10-CM | POA: Insufficient documentation

## 2015-03-10 DIAGNOSIS — N184 Chronic kidney disease, stage 4 (severe): Secondary | ICD-10-CM | POA: Diagnosis not present

## 2015-03-10 DIAGNOSIS — E119 Type 2 diabetes mellitus without complications: Secondary | ICD-10-CM | POA: Diagnosis not present

## 2015-03-10 DIAGNOSIS — R938 Abnormal findings on diagnostic imaging of other specified body structures: Secondary | ICD-10-CM | POA: Diagnosis not present

## 2015-03-10 NOTE — Progress Notes (Signed)
Filed Vitals:   03/10/15 1601 03/10/15 1609  BP: 164/72 158/78  Pulse: 74 74  Temp: 98.8 F (37.1 C)   Resp: 18   Weight: 260 lb (117.935 kg)   SpO2: 99%

## 2015-03-10 NOTE — Progress Notes (Signed)
Subjective:     Patient ID: [name], male   DOB: [birth year], 59 y.o.   MRN: [id number]  HPI this 59 year old male with chronic kidney disease stage IV was referred by Dr.Befacadu for evaluation for vascular access. Patient is right-handed. He has had bilateral below-knee amputations in the past from diabetes mellitus. He is not quite ready to proceed with vascular access.  Past Medical History  Diagnosis Date  . Nonischemic cardiomyopathy ([place])   . Hyperlipidemia   . Hypertension   . Diabetes mellitus   . Tobacco abuse   . Cerebrovascular disease   . History of TIAs   . PVD (peripheral vascular disease) ([place])   . Amputated below knee ([facility])     Right  . Skin disease     Rare  . Chronic kidney disease     Stage IV  . CHF (congestive heart failure) ([place])   . Anemia     Social History  Substance Use Topics  . Smoking status: Former Smoker -- 1.00 packs/day for 20 years  . Smokeless tobacco: Never Used  . Alcohol Use: 0.0 oz/week    0 Standard drinks or equivalent per week     Comment: Occasional    Family History  Problem Relation Age of Onset  . Heart failure Mother   . Diabetes Mother   . Diabetes Father   . Stroke Father   . Diabetes Brother   . Diabetes Brother   . Diabetes Brother   . Diabetes Brother   . Diabetes Other   . Coronary artery disease Other     Allergies  Allergen Reactions  . Contrast Media [Iodinated Diagnostic Agents]      Current outpatient prescriptions:  .  amLODipine (NORVASC) 10 MG tablet, Take 10 mg by mouth daily.  , Disp: , Rfl:  .  aspirin 81 MG tablet, Take 81 mg by mouth daily.  , Disp: , Rfl:  .  carvedilol (COREG) 25 MG tablet, Take 1 tablet (25 mg total) by mouth 2 (two) times daily., Disp: 60 tablet, Rfl: 0 .  Choline Fenofibrate (TRILIPIX) 135 MG capsule, Take 135 mg by mouth daily.  , Disp: , Rfl:  .  cloNIDine (CATAPRES) 0.1 MG tablet, take 1 tablet by mouth twice a day, Disp: 60 tablet, Rfl: 5 .  clopidogrel (PLAVIX)  75 MG tablet, Take 75 mg by mouth daily.  , Disp: , Rfl:  .  furosemide (LASIX) 20 MG tablet, Take 20 mg by mouth 3 (three) times daily.  , Disp: , Rfl:  .  insulin detemir (LEVEMIR) 100 UNIT/ML injection, Inject into the skin 2 (two) times daily.  , Disp: , Rfl:  .  Insulin Glargine (LANTUS SOLOSTAR [place]), Inject into the skin., Disp: , Rfl:  .  insulin glulisine (APIDRA) 100 UNIT/ML injection, Inject into the skin as directed.  , Disp: , Rfl:  .  spironolactone (ALDACTONE) 25 MG tablet, Take 25 mg by mouth 2 (two) times daily., Disp: , Rfl:  .  metFORMIN (GLUCOPHAGE) 500 MG tablet, Take 500 mg by mouth 2 (two) times daily with a meal.  , Disp: , Rfl:  .  omega-3 acid ethyl esters (LOVAZA) 1 G capsule, Take 1 capsule by mouth 2 (two) times daily.  , Disp: , Rfl:  .  potassium chloride (KLOR-CON) 20 MEQ packet, Take 20 mEq by mouth 2 (two) times daily.  , Disp: , Rfl:  .  pregabalin (LYRICA) 100 MG capsule, Take 100 mg by mouth  2 (two) times daily.  , Disp: , Rfl:  .  vitamin B-12 (CYANOCOBALAMIN) 1000 MCG tablet, Take 1,000 mcg by mouth daily.  , Disp: , Rfl:   Filed Vitals:   03/10/15 1601 03/10/15 1609  BP: 164/72 158/78  Pulse: 74 74  Temp: 98.8 F (37.1 C)   Resp: 18   Weight: 260 lb (117.935 kg)   SpO2: 99%     Body mass index is 35.25 kg/(m^2).           Review of Systems has history of diabetes mellitus, chronic renal insufficiency, GERD, ongoing tobacco abuse, hypertension,    Objective:   Physical Exam BP 158/78 mmHg  Pulse 74  Temp(Src) 98.8 F (37.1 C)  Resp 18  Wt 260 lb (117.935 kg)  SpO2 99%  Gen.-alert and oriented x3 in no apparent distress HEENT normal for age Lungs no rhonchi or wheezing Cardiovascular regular rhythm no murmurs carotid pulses 3+ palpable no bruits audible Abdomen soft nontender no palpable masses Musculoskeletal free of  major deformities Skin clear -no rashes Neurologic normal Lower extremities 3+ femoral pulses. Bilateral  below-knee amputations with 3+ brachial and 2+ radial pulse palpable bilaterally.  Today I ordered bilateral vein mapping which I reviewed and interpreted. The cephalic vein in the left upper arm appears satisfactory for fistula creation      Assessment:     chronic kidney disease stage IV-needs vascular access    Plan:     will plan left brachial-cephalic AV fistula as soon as patient consents to proceed

## 2015-03-11 ENCOUNTER — Other Ambulatory Visit: Payer: Self-pay

## 2015-03-12 ENCOUNTER — Encounter: Payer: Self-pay | Admitting: Nephrology

## 2015-03-12 NOTE — Patient Instructions (Signed)
[name]  03/12/2015     @PREFPERIOPPHARMACY @   Your procedure is scheduled on 03/17/15.  Report to [facility] at 7:00 A.M.  Call this number if you have problems the morning of surgery:  [phone]   Remember:  Do not eat food or drink liquids after midnight.  Take these medicines the morning of surgery with A SIP OF WATER Amlodipine, Coreg, Catapress, Lyrica  DO NOT TAKE DIABETES MEDICATION DAY OF PROCEDURE  TAKE ONLY 1/2 OF EVENING DOSE OF INSULIN NIGHT PRIOR TO PROCEDURE    Do not wear jewelry, make-up or [name] polish.  Do not wear lotions, powders, or perfumes.  You may wear deodorant.  Do not shave 48 hours prior to surgery.  Men may shave face and neck.  Do not bring valuables to the hospital.  [facility] is not responsible for any belongings or valuables.  Contacts, dentures or bridgework may not be worn into surgery.  Leave your suitcase in the car.  After surgery it may be brought to your room.  For patients admitted to the hospital, discharge time will be determined by your treatment team.  Patients discharged the day of surgery will not be allowed to drive home.    Please read over the following fact sheets that you were given. Anesthesia Post-op Instructions     PATIENT INSTRUCTIONS POST-ANESTHESIA  IMMEDIATELY FOLLOWING SURGERY:  Do not drive or operate machinery for the first twenty four hours after surgery.  Do not make any important decisions for twenty four hours after surgery or while taking narcotic pain medications or sedatives.  If you develop intractable nausea and vomiting or a severe headache please notify your doctor immediately.  FOLLOW-UP:  Please make an appointment with your surgeon as instructed. You do not need to follow up with anesthesia unless specifically instructed to do so.  WOUND CARE INSTRUCTIONS (if applicable):  Keep a dry clean dressing on the anesthesia/puncture wound site if there is drainage.  Once the wound has quit  draining you may leave it open to air.  Generally you should leave the bandage intact for twenty four hours unless there is drainage.  If the epidural site drains for more than 36-48 hours please call the anesthesia department.  QUESTIONS?:  Please feel free to call your physician or the hospital operator if you have any questions, and they will be happy to assist you.       A cataract is a clouding of the lens of the eye. When a lens becomes cloudy, vision is reduced based on the degree and nature of the clouding. Surgery may be needed to improve vision. Surgery removes the cloudy lens and usually replaces it with a substitute lens (intraocular lens, IOL). LET YOUR EYE DOCTOR KNOW ABOUT:  Allergies to food or medicine.  Medicines taken including herbs, eye drops, over-the-counter medicines, and creams.  Use of steroids (by mouth or creams).  Previous problems with anesthetics or numbing medicine.  History of bleeding problems or blood clots.  Previous surgery.  Other health problems, including diabetes and kidney problems.  Possibility of pregnancy, if this applies. RISKS AND COMPLICATIONS  Infection.  Inflammation of the eyeball (endophthalmitis) that can spread to both eyes (sympathetic ophthalmia).  Poor wound healing.  If an IOL is inserted, it can later fall out of proper position. This is very uncommon.  Clouding of the part of your eye that holds an IOL in place. This is called an "after-cataract." These are uncommon  but easily treated. BEFORE THE PROCEDURE  Do not eat or drink anything except small amounts of water for 8 to 12 before your surgery, or as directed by your caregiver.  Unless you are told otherwise, continue any eye drops you have been prescribed.  Talk to your primary caregiver about all other medicines that you take (both prescription and nonprescription). In some cases, you may need to stop or change medicines near the time of your surgery. This is  most important if you are taking blood-thinning medicine.Do not stop medicines unless you are told to do so.  Arrange for someone to drive you to and from the procedure.  Do not put contact lenses in either eye on the day of your surgery. PROCEDURE There is more than one method for safely removing a cataract. Your doctor can explain the differences and help determine which is best for you. Phacoemulsification surgery is the most common form of cataract surgery.  An injection is given behind the eye or eye drops are given to make this a painless procedure.  A small cut (incision) is made on the edge of the clear, dome-shaped surface that covers the front of the eye (cornea).  A tiny probe is painlessly inserted into the eye. This device gives off ultrasound waves that soften and break up the cloudy center of the lens. This makes it easier for the cloudy lens to be removed by suction.  An IOL may be implanted.  The normal lens of the eye is covered by a clear capsule. Part of that capsule is intentionally left in the eye to support the IOL.  Your surgeon may or may not use stitches to close the incision. There are other forms of cataract surgery that require a larger incision and stitches to close the eye. This approach is taken in cases where the doctor feels that the cataract cannot be easily removed using phacoemulsification. AFTER THE PROCEDURE  When an IOL is implanted, it does not need care. It becomes a permanent part of your eye and cannot be seen or felt.  Your doctor will schedule follow-up exams to check on your progress.  Review your other medicines with your doctor to see which can be resumed after surgery.  Use eye drops or take medicine as prescribed by your doctor.   This information is not intended to replace advice given to you by your health care provider. Make sure you discuss any questions you have with your health care provider.   Document Released: 03/10/2011  Document Revised: 04/11/2014 Document Reviewed: 03/10/2011 Elsevier Interactive Patient Education [employer].

## 2015-03-13 ENCOUNTER — Encounter (HOSPITAL_COMMUNITY)
Admission: RE | Admit: 2015-03-13 | Discharge: 2015-03-13 | Disposition: A | Discharge status: Home / Self Care | Payer: Medicare Other | Source: Ambulatory Visit | Attending: Ophthalmology | Admitting: Ophthalmology

## 2015-03-13 ENCOUNTER — Other Ambulatory Visit: Payer: Self-pay

## 2015-03-13 ENCOUNTER — Encounter (HOSPITAL_COMMUNITY): Payer: Self-pay

## 2015-03-13 DIAGNOSIS — H2511 Age-related nuclear cataract, right eye: Secondary | ICD-10-CM | POA: Insufficient documentation

## 2015-03-13 DIAGNOSIS — Z01818 Encounter for other preprocedural examination: Secondary | ICD-10-CM | POA: Insufficient documentation

## 2015-03-13 LAB — BASIC METABOLIC PANEL
ANION GAP: 9 (ref 5–15)
BUN: 62 mg/dL — AB (ref 6–20)
CHLORIDE: 107 mmol/L (ref 101–111)
CO2: 24 mmol/L (ref 22–32)
Calcium: 8.5 mg/dL — ABNORMAL LOW (ref 8.9–10.3)
Creatinine, Ser: 4.53 mg/dL — ABNORMAL HIGH (ref 0.61–1.24)
GFR calc Af Amer: 15 mL/min — ABNORMAL LOW (ref >60)
GFR, EST NON AFRICAN AMERICAN: 13 mL/min — AB (ref >60)
GLUCOSE: 293 mg/dL — AB (ref 65–99)
POTASSIUM: 4.7 mmol/L (ref 3.5–5.1)
Sodium: 140 mmol/L (ref 135–145)

## 2015-03-13 LAB — CBC
HEMATOCRIT: 29.4 % — AB (ref 39.0–52.0)
HEMOGLOBIN: 9.5 g/dL — AB (ref 13.0–17.0)
MCH: 29.2 pg (ref 26.0–34.0)
MCHC: 32.3 g/dL (ref 30.0–36.0)
MCV: 90.5 fL (ref 78.0–100.0)
PLATELETS: 226 10*3/uL (ref 150–400)
RBC: 3.25 MIL/uL — AB (ref 4.22–5.81)
RDW: 11.9 % (ref 11.5–15.5)
WBC: 7.4 10*3/uL (ref 4.0–10.5)

## 2015-03-16 MED ORDER — KETOROLAC TROMETHAMINE 0.5 % OP SOLN
OPHTHALMIC | Status: AC
  Filled 2015-03-16: qty 5

## 2015-03-16 MED ORDER — CYCLOPENTOLATE-PHENYLEPHRINE OP SOLN OPTIME - NO CHARGE
OPHTHALMIC | Status: AC
  Filled 2015-03-16: qty 2

## 2015-03-16 MED ORDER — PHENYLEPHRINE HCL 2.5 % OP SOLN
OPHTHALMIC | Status: AC
  Filled 2015-03-16: qty 15

## 2015-03-16 MED ORDER — TETRACAINE HCL 0.5 % OP SOLN
OPHTHALMIC | Status: AC
  Filled 2015-03-16: qty 4

## 2015-03-17 ENCOUNTER — Ambulatory Visit (HOSPITAL_COMMUNITY)
Admission: RE | Admit: 2015-03-17 | Discharge: 2015-03-17 | Disposition: A | Discharge status: Home / Self Care | Payer: Medicare Other | Source: Ambulatory Visit | Attending: Ophthalmology | Admitting: Ophthalmology

## 2015-03-17 ENCOUNTER — Ambulatory Visit (HOSPITAL_COMMUNITY): Payer: Medicare Other | Admitting: Anesthesiology

## 2015-03-17 ENCOUNTER — Ambulatory Visit (HOSPITAL_COMMUNITY): Admission: RE | Admit: 2015-03-17 | Payer: Medicare Other | Source: Ambulatory Visit | Admitting: Ophthalmology

## 2015-03-17 ENCOUNTER — Encounter (HOSPITAL_COMMUNITY): Payer: Self-pay | Admitting: *Deleted

## 2015-03-17 ENCOUNTER — Encounter (HOSPITAL_COMMUNITY)
Admission: RE | Disposition: A | Discharge status: Home / Self Care | Payer: Self-pay | Source: Ambulatory Visit | Attending: Ophthalmology

## 2015-03-17 DIAGNOSIS — N189 Chronic kidney disease, unspecified: Secondary | ICD-10-CM | POA: Diagnosis not present

## 2015-03-17 DIAGNOSIS — I130 Hypertensive heart and chronic kidney disease with heart failure and stage 1 through stage 4 chronic kidney disease, or unspecified chronic kidney disease: Secondary | ICD-10-CM | POA: Diagnosis not present

## 2015-03-17 DIAGNOSIS — Z794 Long term (current) use of insulin: Secondary | ICD-10-CM | POA: Diagnosis not present

## 2015-03-17 DIAGNOSIS — H2511 Age-related nuclear cataract, right eye: Secondary | ICD-10-CM | POA: Diagnosis not present

## 2015-03-17 DIAGNOSIS — Z87891 Personal history of nicotine dependence: Secondary | ICD-10-CM | POA: Insufficient documentation

## 2015-03-17 DIAGNOSIS — Z7982 Long term (current) use of aspirin: Secondary | ICD-10-CM | POA: Diagnosis not present

## 2015-03-17 DIAGNOSIS — E119 Type 2 diabetes mellitus without complications: Secondary | ICD-10-CM | POA: Insufficient documentation

## 2015-03-17 DIAGNOSIS — I509 Heart failure, unspecified: Secondary | ICD-10-CM | POA: Diagnosis not present

## 2015-03-17 DIAGNOSIS — Z79899 Other long term (current) drug therapy: Secondary | ICD-10-CM | POA: Diagnosis not present

## 2015-03-17 DIAGNOSIS — Z7902 Long term (current) use of antithrombotics/antiplatelets: Secondary | ICD-10-CM | POA: Insufficient documentation

## 2015-03-17 HISTORY — PX: CATARACT EXTRACTION W/PHACO: SHX586

## 2015-03-17 LAB — GLUCOSE, CAPILLARY: Glucose-Capillary: 149 mg/dL — ABNORMAL HIGH (ref 65–99)

## 2015-03-17 SURGERY — PHACOEMULSIFICATION, CATARACT, WITH IOL INSERTION
Anesthesia: Monitor Anesthesia Care | Site: Eye | Laterality: Right

## 2015-03-17 MED ORDER — LABETALOL HCL 5 MG/ML IV SOLN
INTRAVENOUS | Status: AC
  Filled 2015-03-17: qty 4

## 2015-03-17 MED ORDER — TETRACAINE 0.5 % OP SOLN OPTIME - NO CHARGE
OPHTHALMIC | Status: DC | PRN
  Administered 2015-03-17: 2 [drp] via OPHTHALMIC

## 2015-03-17 MED ORDER — FENTANYL CITRATE (PF) 100 MCG/2ML IJ SOLN
INTRAMUSCULAR | Status: AC
  Filled 2015-03-17: qty 2

## 2015-03-17 MED ORDER — MIDAZOLAM HCL 2 MG/2ML IJ SOLN
1.0000 mg | INTRAMUSCULAR | Status: DC | PRN
  Administered 2015-03-17: 2 mg via INTRAVENOUS

## 2015-03-17 MED ORDER — LIDOCAINE HCL (PF) 1 % IJ SOLN
INTRAMUSCULAR | Status: DC | PRN
  Administered 2015-03-17: 1 mL

## 2015-03-17 MED ORDER — TETRACAINE HCL 0.5 % OP SOLN
1.0000 [drp] | OPHTHALMIC | Status: AC
  Administered 2015-03-17 (×2): 1 [drp] via OPHTHALMIC

## 2015-03-17 MED ORDER — CYCLOPENTOLATE-PHENYLEPHRINE 0.2-1 % OP SOLN
1.0000 [drp] | OPHTHALMIC | Status: AC
  Administered 2015-03-17 (×3): 1 [drp] via OPHTHALMIC

## 2015-03-17 MED ORDER — MIDAZOLAM HCL 2 MG/2ML IJ SOLN
INTRAMUSCULAR | Status: AC
  Filled 2015-03-17: qty 2

## 2015-03-17 MED ORDER — KETOROLAC TROMETHAMINE 0.5 % OP SOLN
1.0000 [drp] | OPHTHALMIC | Status: AC
  Administered 2015-03-17 (×3): 1 [drp] via OPHTHALMIC

## 2015-03-17 MED ORDER — EPINEPHRINE HCL 1 MG/ML IJ SOLN
INTRAOCULAR | Status: DC | PRN
  Administered 2015-03-17: 500 mL

## 2015-03-17 MED ORDER — PROVISC 10 MG/ML IO SOLN
INTRAOCULAR | Status: DC | PRN
  Administered 2015-03-17: 0.85 mL via INTRAOCULAR

## 2015-03-17 MED ORDER — LIDOCAINE HCL (PF) 1 % IJ SOLN
INTRAMUSCULAR | Status: AC
  Filled 2015-03-17: qty 2

## 2015-03-17 MED ORDER — PHENYLEPHRINE HCL 2.5 % OP SOLN
1.0000 [drp] | OPHTHALMIC | Status: AC
  Administered 2015-03-17 (×3): 1 [drp] via OPHTHALMIC

## 2015-03-17 MED ORDER — EPINEPHRINE HCL 1 MG/ML IJ SOLN
INTRAMUSCULAR | Status: AC
  Filled 2015-03-17: qty 1

## 2015-03-17 MED ORDER — BSS IO SOLN
INTRAOCULAR | Status: DC | PRN
  Administered 2015-03-17: 15 mL

## 2015-03-17 MED ORDER — FENTANYL CITRATE (PF) 100 MCG/2ML IJ SOLN
25.0000 ug | INTRAMUSCULAR | Status: AC
  Administered 2015-03-17 (×2): 25 ug via INTRAVENOUS

## 2015-03-17 MED ORDER — LABETALOL HCL 5 MG/ML IV SOLN
10.0000 mg | INTRAVENOUS | Status: DC | PRN
  Administered 2015-03-17: 10 mg via INTRAVENOUS

## 2015-03-17 MED ORDER — SODIUM CHLORIDE 0.9 % IV SOLN
INTRAVENOUS | Status: DC
  Administered 2015-03-17: 09:00:00 via INTRAVENOUS

## 2015-03-17 SURGICAL SUPPLY — 11 items
CLOTH BEACON ORANGE TIMEOUT ST (SAFETY) ×1 IMPLANT
EYE SHIELD UNIVERSAL CLEAR (GAUZE/BANDAGES/DRESSINGS) ×1 IMPLANT
GLOVE BIO SURGEON STRL SZ 6.5 (GLOVE) ×1 IMPLANT
GLOVE BIOGEL PI IND STRL 7.0 (GLOVE) IMPLANT
GLOVE BIOGEL PI INDICATOR 7.0 (GLOVE) ×1
LENS ALC ACRYL/TECN (Ophthalmic Related) ×2 IMPLANT
PAD ARMBOARD 7.5X6 YLW CONV (MISCELLANEOUS) ×1 IMPLANT
RING MALYGIN (MISCELLANEOUS) ×1 IMPLANT
TAPE SURG TRANSPORE 1 IN (GAUZE/BANDAGES/DRESSINGS) IMPLANT
TAPE SURGICAL TRANSPORE 1 IN (GAUZE/BANDAGES/DRESSINGS) ×1
WATER STERILE IRR 250ML POUR (IV SOLUTION) ×1 IMPLANT

## 2015-03-17 NOTE — Discharge Instructions (Signed)
°  °          [facility] Instructions °[address]- [place] [address]-[place] °    ° °1. Avoid closing eyes tightly. One often closes the eye tightly when laughing, talking, sneezing, coughing or if they feel irritated. At these times, you should be careful not to close your eyes tightly. ° °2. Instill eye drops as instructed. To instill drops in your eye, open it, look up and have someone gently pull the lower lid down and instill a couple of drops inside the lower lid. ° °3. Do not touch upper lid. ° °4. Take Advil or Tylenol for pain. ° °5. You may use either eye for near work, such as reading or sewing and you may watch television. ° °6. You may have your hair done at the beauty parlor at any time. ° °7. Wear dark glasses with or without your own glasses if you are in bright light. ° °8. Call our office at [phone] or [phone] if you have sharp pain in your eye or unusual symptoms. ° °9.  FOLLOW UP WITH DR. SHAPIRO TODAY IN HIS [place] OFFICE AT 2:45pm. ° °  °I have received a copy of the above instructions and will follow them.  ° ° ° °IF YOU ARE IN IMMEDIATE DANGER CALL 911! ° °It is important for you to keep your follow-up appointment with your physician after discharge, OR, for you /your caregiver to make a follow-up appointment with your physician / medical provider after discharge. ° °Show these instructions to the next healthcare provider you see. ° °

## 2015-03-17 NOTE — Anesthesia Postprocedure Evaluation (Signed)
  Anesthesia Post-op Note  Patient: [name]  Procedure(s) Performed: Procedure(s) (LRB): CATARACT EXTRACTION PHACO AND INTRAOCULAR LENS PLACEMENT (IOC) (Right)  Patient Location:  Short Stay  Anesthesia Type: MAC  Level of Consciousness: awake  Airway and Oxygen Therapy: Patient Spontanous Breathing  Post-op Pain: none  Post-op Assessment: Post-op Vital signs reviewed, Patient's Cardiovascular Status Stable, Respiratory Function Stable, Patent Airway, No signs of Nausea or vomiting and Pain level controlled  Post-op Vital Signs: Reviewed and stable  Complications: No apparent anesthesia complications

## 2015-03-17 NOTE — H&P (Signed)
The patient was re examined and there is no change in the patients condition since the original H and P. 

## 2015-03-17 NOTE — Anesthesia Procedure Notes (Signed)
Procedure Name: MAC Date/Time: 03/17/2015 8:50 AM Performed by: Vista Deck Pre-anesthesia Checklist: Patient identified, Emergency Drugs available, Suction available, Timeout performed and Patient being monitored Patient Re-evaluated:Patient Re-evaluated prior to inductionOxygen Delivery Method: Nasal Cannula

## 2015-03-17 NOTE — Transfer of Care (Signed)
Immediate Anesthesia Transfer of Care Note  Patient: [name]  Procedure(s) Performed: Procedure(s) (LRB): CATARACT EXTRACTION PHACO AND INTRAOCULAR LENS PLACEMENT (IOC) (Right)  Patient Location: Shortstay  Anesthesia Type: MAC  Level of Consciousness: awake  Airway & Oxygen Therapy: Patient Spontanous Breathing   Post-op Assessment: Report given to PACU RN, Post -op Vital signs reviewed and stable and Patient moving all extremities  Post vital signs: Reviewed and stable  Complications: No apparent anesthesia complications

## 2015-03-17 NOTE — Op Note (Signed)
Patient brought to the operating room and prepped and draped in the usual manner.  Lid speculum inserted in right eye.  Stab incision made at the twelve o'clock position. Intraocular Xylocaine was instilled. Provisc instilled in the anterior chamber.   A 2.4 mm. Stab incision was made temporally.  Due to a small pupil, a Malugyn Ring was inserted. An anterior capsulotomy was done with a bent 25 gauge needle.  The nucleus was hydrodissected.  The Phaco tip was inserted in the anterior chamber and the nucleus was emulsified.  CDE was 6.59.  The cortical material was then removed with the I and A tip.  Posterior capsule was the polished.  The anterior chamber was deepened with Provisc.  A 17.5 Diopter Hoya 250 IOL was then inserted in the capsular bag.  The Malugyn Ring was removed.  Provisc was then removed with the I and A tip.  The wound was then hydrated.  Patient sent to the Recovery Room in good condition with follow up in my office.  Preoperative Diagnosis:  Cortical and Nuclear Cataract OD Postoperative Diagnosis:  Same Procedure name: Kelman Phacoemulsification OD with IOL

## 2015-03-17 NOTE — Anesthesia Preprocedure Evaluation (Signed)
Anesthesia Evaluation  Patient identified by MRN, date of birth, ID band Patient awake    Airway Mallampati: II  TM Distance: >3 FB     Dental  (+) Teeth Intact   Pulmonary former smoker,    breath sounds clear to auscultation       Cardiovascular hypertension, Pt. on medications + Peripheral Vascular Disease and +CHF  + Cardiac Defibrillator  Rhythm:Regular Rate:Normal     Neuro/Psych    GI/Hepatic   Endo/Other  diabetes, Type 2  Renal/GU Renal InsufficiencyRenal disease     Musculoskeletal   Abdominal   Peds  Hematology   Anesthesia Other Findings   Reproductive/Obstetrics                             Anesthesia Physical Anesthesia Plan  ASA: III  Anesthesia Plan: MAC   Post-op Pain Management:    Induction: Intravenous  Airway Management Planned: Nasal Cannula  Additional Equipment:   Intra-op Plan:   Post-operative Plan:   Informed Consent: I have reviewed the patients History and Physical, chart, labs and discussed the procedure including the risks, benefits and alternatives for the proposed anesthesia with the patient or authorized representative who has indicated his/her understanding and acceptance.     Plan Discussed with:   Anesthesia Plan Comments:         Anesthesia Quick Evaluation

## 2015-03-18 ENCOUNTER — Encounter (HOSPITAL_COMMUNITY): Payer: Self-pay | Admitting: Ophthalmology

## 2015-04-07 ENCOUNTER — Encounter (HOSPITAL_COMMUNITY): Payer: Self-pay | Admitting: *Deleted

## 2015-04-07 MED ORDER — CHLORHEXIDINE GLUCONATE CLOTH 2 % EX PADS: 6.0000 | MEDICATED_PAD | Freq: Once | CUTANEOUS | Status: DC

## 2015-04-07 MED ORDER — DEXTROSE 5 % IV SOLN
1.5000 g | INTRAVENOUS | Status: AC
  Administered 2015-04-08: 1.5 g via INTRAVENOUS
  Filled 2015-04-07 (×2): qty 1.5

## 2015-04-07 MED ORDER — SODIUM CHLORIDE 0.9 % IV SOLN
INTRAVENOUS | Status: DC
  Administered 2015-04-08 (×2): via INTRAVENOUS

## 2015-04-07 NOTE — Progress Notes (Signed)
[name] reported that Dr Evelena Leyden office instructed him to take 1/2 of Lantus this pm.  Patient needs me to call back after 6pm.

## 2015-04-07 NOTE — Progress Notes (Signed)
   04/07/15 1817  OBSTRUCTIVE SLEEP APNEA  Have you ever been diagnosed with sleep apnea through a sleep study? No  Do you snore loudly (loud enough to be heard through closed doors)?  0  Do you often feel tired, fatigued, or sleepy during the daytime (such as falling asleep during driving or talking to someone)? 1 (after taking medication)  Has anyone observed you stop breathing during your sleep? 0  Do you have, or are you being treated for high blood pressure? 1  BMI more than 35 kg/m2? 1  Age > 78 (1-yes) 1  Male Gender (Yes=1) 1  Obstructive Sleep Apnea Score 5  Score 5 or greater  Results sent to PCP

## 2015-04-08 ENCOUNTER — Encounter (HOSPITAL_COMMUNITY): Payer: Self-pay | Admitting: *Deleted

## 2015-04-08 ENCOUNTER — Encounter (HOSPITAL_COMMUNITY)
Admission: RE | Disposition: A | Discharge status: Home / Self Care | Payer: Self-pay | Source: Ambulatory Visit | Attending: Vascular Surgery

## 2015-04-08 ENCOUNTER — Other Ambulatory Visit: Payer: Self-pay | Admitting: *Deleted

## 2015-04-08 ENCOUNTER — Ambulatory Visit (HOSPITAL_COMMUNITY): Payer: Medicare Other | Admitting: Anesthesiology

## 2015-04-08 ENCOUNTER — Ambulatory Visit (HOSPITAL_COMMUNITY)
Admission: RE | Admit: 2015-04-08 | Discharge: 2015-04-08 | Disposition: A | Discharge status: Home / Self Care | Payer: Medicare Other | Source: Ambulatory Visit | Attending: Vascular Surgery | Admitting: Vascular Surgery

## 2015-04-08 ENCOUNTER — Encounter: Payer: Self-pay | Admitting: Internal Medicine

## 2015-04-08 DIAGNOSIS — Z794 Long term (current) use of insulin: Secondary | ICD-10-CM | POA: Diagnosis not present

## 2015-04-08 DIAGNOSIS — Z79899 Other long term (current) drug therapy: Secondary | ICD-10-CM | POA: Insufficient documentation

## 2015-04-08 DIAGNOSIS — I129 Hypertensive chronic kidney disease with stage 1 through stage 4 chronic kidney disease, or unspecified chronic kidney disease: Secondary | ICD-10-CM | POA: Diagnosis not present

## 2015-04-08 DIAGNOSIS — Z4931 Encounter for adequacy testing for hemodialysis: Secondary | ICD-10-CM

## 2015-04-08 DIAGNOSIS — N186 End stage renal disease: Secondary | ICD-10-CM

## 2015-04-08 DIAGNOSIS — E785 Hyperlipidemia, unspecified: Secondary | ICD-10-CM | POA: Diagnosis not present

## 2015-04-08 DIAGNOSIS — Z89512 Acquired absence of left leg below knee: Secondary | ICD-10-CM | POA: Diagnosis not present

## 2015-04-08 DIAGNOSIS — Z8673 Personal history of transient ischemic attack (TIA), and cerebral infarction without residual deficits: Secondary | ICD-10-CM | POA: Diagnosis not present

## 2015-04-08 DIAGNOSIS — E1122 Type 2 diabetes mellitus with diabetic chronic kidney disease: Secondary | ICD-10-CM | POA: Insufficient documentation

## 2015-04-08 DIAGNOSIS — Z89511 Acquired absence of right leg below knee: Secondary | ICD-10-CM | POA: Diagnosis not present

## 2015-04-08 DIAGNOSIS — Z7982 Long term (current) use of aspirin: Secondary | ICD-10-CM | POA: Insufficient documentation

## 2015-04-08 DIAGNOSIS — Z87891 Personal history of nicotine dependence: Secondary | ICD-10-CM | POA: Diagnosis not present

## 2015-04-08 DIAGNOSIS — Z91041 Radiographic dye allergy status: Secondary | ICD-10-CM | POA: Diagnosis not present

## 2015-04-08 DIAGNOSIS — N184 Chronic kidney disease, stage 4 (severe): Secondary | ICD-10-CM | POA: Insufficient documentation

## 2015-04-08 HISTORY — DX: Cerebral infarction, unspecified: I63.9

## 2015-04-08 HISTORY — DX: Reserved for inherently not codable concepts without codable children: IMO0001

## 2015-04-08 HISTORY — DX: Presence of automatic (implantable) cardiac defibrillator: Z95.810

## 2015-04-08 HISTORY — DX: Unspecified osteoarthritis, unspecified site: M19.90

## 2015-04-08 HISTORY — PX: AV FISTULA PLACEMENT: SHX1204

## 2015-04-08 LAB — GLUCOSE, CAPILLARY
GLUCOSE-CAPILLARY: 232 mg/dL — AB (ref 65–99)
Glucose-Capillary: 197 mg/dL — ABNORMAL HIGH (ref 65–99)
Glucose-Capillary: 206 mg/dL — ABNORMAL HIGH (ref 65–99)

## 2015-04-08 LAB — POCT I-STAT 4, (NA,K, GLUC, HGB,HCT)
Glucose, Bld: 244 mg/dL — ABNORMAL HIGH (ref 65–99)
HCT: 30 % — ABNORMAL LOW (ref 39.0–52.0)
Hemoglobin: 10.2 g/dL — ABNORMAL LOW (ref 13.0–17.0)
POTASSIUM: 4.5 mmol/L (ref 3.5–5.1)
SODIUM: 145 mmol/L (ref 135–145)

## 2015-04-08 SURGERY — ARTERIOVENOUS (AV) FISTULA CREATION
Anesthesia: Monitor Anesthesia Care | Site: Arm Upper | Laterality: Left

## 2015-04-08 MED ORDER — PROPOFOL 10 MG/ML IV BOLUS
INTRAVENOUS | Status: AC
  Filled 2015-04-08: qty 20

## 2015-04-08 MED ORDER — CARVEDILOL 25 MG PO TABS
25.0000 mg | ORAL_TABLET | Freq: Two times a day (BID) | ORAL | Status: AC
  Administered 2015-04-08: 25 mg via ORAL
  Filled 2015-04-08: qty 1

## 2015-04-08 MED ORDER — LIDOCAINE HCL (CARDIAC) 20 MG/ML IV SOLN
INTRAVENOUS | Status: AC
  Filled 2015-04-08: qty 5

## 2015-04-08 MED ORDER — MIDAZOLAM HCL 5 MG/5ML IJ SOLN
INTRAMUSCULAR | Status: DC | PRN
  Administered 2015-04-08: 2 mg via INTRAVENOUS

## 2015-04-08 MED ORDER — SODIUM CHLORIDE 0.9 % IV SOLN: INTRAVENOUS | Status: DC

## 2015-04-08 MED ORDER — 0.9 % SODIUM CHLORIDE (POUR BTL) OPTIME
TOPICAL | Status: DC | PRN
  Administered 2015-04-08: 1000 mL

## 2015-04-08 MED ORDER — FENTANYL CITRATE (PF) 250 MCG/5ML IJ SOLN
INTRAMUSCULAR | Status: AC
  Filled 2015-04-08: qty 5

## 2015-04-08 MED ORDER — PHENYLEPHRINE HCL 10 MG/ML IJ SOLN
INTRAMUSCULAR | Status: DC | PRN
  Administered 2015-04-08: 120 ug via INTRAVENOUS

## 2015-04-08 MED ORDER — SODIUM CHLORIDE 0.9 % IV SOLN
INTRAVENOUS | Status: DC | PRN
  Administered 2015-04-08: 500 mL

## 2015-04-08 MED ORDER — MIDAZOLAM HCL 2 MG/2ML IJ SOLN
INTRAMUSCULAR | Status: AC
  Filled 2015-04-08: qty 2

## 2015-04-08 MED ORDER — LIDOCAINE HCL (PF) 1 % IJ SOLN
INTRAMUSCULAR | Status: AC
  Filled 2015-04-08: qty 30

## 2015-04-08 MED ORDER — PROPOFOL 500 MG/50ML IV EMUL
INTRAVENOUS | Status: DC | PRN
  Administered 2015-04-08: 50 ug/kg/min via INTRAVENOUS

## 2015-04-08 MED ORDER — CARVEDILOL 12.5 MG PO TABS
ORAL_TABLET | ORAL | Status: AC
  Filled 2015-04-08: qty 2

## 2015-04-08 MED ORDER — LIDOCAINE-EPINEPHRINE (PF) 1 %-1:200000 IJ SOLN
INTRAMUSCULAR | Status: DC | PRN
  Administered 2015-04-08: 30 mL

## 2015-04-08 MED ORDER — LIDOCAINE-EPINEPHRINE (PF) 1 %-1:200000 IJ SOLN
INTRAMUSCULAR | Status: AC
  Filled 2015-04-08: qty 30

## 2015-04-08 MED ORDER — FENTANYL CITRATE (PF) 100 MCG/2ML IJ SOLN
INTRAMUSCULAR | Status: DC | PRN
  Administered 2015-04-08: 50 ug via INTRAVENOUS

## 2015-04-08 MED ORDER — OXYCODONE HCL 5 MG PO TABS: 5.0000 mg | ORAL_TABLET | Freq: Every evening | ORAL | Status: DC | PRN

## 2015-04-08 MED ORDER — LIDOCAINE HCL (CARDIAC) 20 MG/ML IV SOLN
INTRAVENOUS | Status: DC | PRN
  Administered 2015-04-08: 100 mg via INTRATRACHEAL

## 2015-04-08 SURGICAL SUPPLY — 31 items
ARMBAND PINK RESTRICT EXTREMIT (MISCELLANEOUS) ×2 IMPLANT
CANISTER SUCTION 2500CC (MISCELLANEOUS) ×2 IMPLANT
CLIP TI MEDIUM 6 (CLIP) ×2 IMPLANT
CLIP TI WIDE RED SMALL 6 (CLIP) ×2 IMPLANT
COVER PROBE W GEL 5X96 (DRAPES) ×1 IMPLANT
DRAIN PENROSE 1/4X12 LTX STRL (WOUND CARE) ×2 IMPLANT
ELECT REM PT RETURN 9FT ADLT (ELECTROSURGICAL) ×2
ELECTRODE REM PT RTRN 9FT ADLT (ELECTROSURGICAL) ×1 IMPLANT
GEL ULTRASOUND 20GR AQUASONIC (MISCELLANEOUS) IMPLANT
GLOVE BIO SURGEON STRL SZ 6.5 (GLOVE) ×2 IMPLANT
GLOVE BIOGEL PI IND STRL 6.5 (GLOVE) IMPLANT
GLOVE BIOGEL PI IND STRL 8 (GLOVE) IMPLANT
GLOVE BIOGEL PI INDICATOR 6.5 (GLOVE) ×2
GLOVE BIOGEL PI INDICATOR 8 (GLOVE) ×1
GLOVE SS BIOGEL STRL SZ 6.5 (GLOVE) IMPLANT
GLOVE SS BIOGEL STRL SZ 7 (GLOVE) ×1 IMPLANT
GLOVE SUPERSENSE BIOGEL SZ 6.5 (GLOVE) ×1
GLOVE SUPERSENSE BIOGEL SZ 7 (GLOVE) ×1
GOWN STRL REUS W/ TWL LRG LVL3 (GOWN DISPOSABLE) ×3 IMPLANT
GOWN STRL REUS W/TWL LRG LVL3 (GOWN DISPOSABLE) ×6
KIT BASIN OR (CUSTOM PROCEDURE TRAY) ×2 IMPLANT
KIT ROOM TURNOVER OR (KITS) ×2 IMPLANT
LIQUID BAND (GAUZE/BANDAGES/DRESSINGS) ×2 IMPLANT
NS IRRIG 1000ML POUR BTL (IV SOLUTION) ×2 IMPLANT
PACK CV ACCESS (CUSTOM PROCEDURE TRAY) ×2 IMPLANT
PAD ARMBOARD 7.5X6 YLW CONV (MISCELLANEOUS) ×4 IMPLANT
SUT PROLENE 6 0 BV (SUTURE) ×2 IMPLANT
SUT VIC AB 3-0 SH 27 (SUTURE) ×2
SUT VIC AB 3-0 SH 27X BRD (SUTURE) ×1 IMPLANT
UNDERPAD 30X30 INCONTINENT (UNDERPADS AND DIAPERS) ×2 IMPLANT
WATER STERILE IRR 1000ML POUR (IV SOLUTION) ×2 IMPLANT

## 2015-04-08 NOTE — Anesthesia Preprocedure Evaluation (Addendum)
Anesthesia Evaluation  Patient identified by MRN, date of birth, ID band Patient awake    Reviewed: Allergy & Precautions, NPO status , Patient's Chart, lab work & pertinent test results  Airway Mallampati: II  TM Distance: >3 FB     Dental  (+) Teeth Intact   Pulmonary former smoker,    breath sounds clear to auscultation       Cardiovascular hypertension, Pt. on medications + Peripheral Vascular Disease and +CHF  + Cardiac Defibrillator  Rhythm:Regular Rate:Normal     Neuro/Psych    GI/Hepatic   Endo/Other  diabetes, Type 2  Renal/GU Renal InsufficiencyRenal disease     Musculoskeletal   Abdominal   Peds  Hematology   Anesthesia Other Findings   Reproductive/Obstetrics                            Anesthesia Physical  Anesthesia Plan  ASA: III  Anesthesia Plan: MAC   Post-op Pain Management:    Induction: Intravenous  Airway Management Planned: Nasal Cannula  Additional Equipment:   Intra-op Plan:   Post-operative Plan:   Informed Consent: I have reviewed the patients History and Physical, chart, labs and discussed the procedure including the risks, benefits and alternatives for the proposed anesthesia with the patient or authorized representative who has indicated his/her understanding and acceptance.     Plan Discussed with: Anesthesiologist and CRNA  Anesthesia Plan Comments: (ICD rep evaluated and rec'd device be disabled during surgery with magnet.. No firings, he is underlying 70s HR, no pacemaker  Denies shortness of breath, chest pain, lungs are clear, he is able to move himself and get around with his upper body strength to roughly 4 mets, no signs of edema.Marland Kitchen Heart failure seems compensated and he is undergoing very low risk surgery : mac with propofol sedation)       Anesthesia Quick Evaluation

## 2015-04-08 NOTE — Anesthesia Postprocedure Evaluation (Signed)
Anesthesia Post Note  Patient: ALEXIE KOHLBECK  Procedure(s) Performed: Procedure(s) (LRB): Creation of Left arm BRACHIOCEPHALIC ARTERIOVENOUS  FISTULA  (Left)  Patient location during evaluation: PACU Anesthesia Type: MAC Level of consciousness: awake and alert Pain management: pain level controlled Vital Signs Assessment: post-procedure vital signs reviewed and stable Respiratory status: spontaneous breathing, nonlabored ventilation, respiratory function stable and patient connected to nasal cannula oxygen Cardiovascular status: stable and blood pressure returned to baseline Anesthetic complications: no Comments: Magnet removed, ICD functioning properly    Last Vitals:  Filed Vitals:   04/08/15 1356 04/08/15 1415  BP: 157/85 166/95  Pulse: 74 75  Temp: 36.5 C   Resp: 17 19    Last Pain:  Filed Vitals:   04/08/15 1429  PainSc: 0-No pain                 Zenaida Deed

## 2015-04-08 NOTE — Interval H&P Note (Signed)
History and Physical Interval Note:  04/08/2015 12:12 PM  [name]  has presented today for surgery, with the diagnosis of Stage IV Chronic Kidney Disease N18.4  The various methods of treatment have been discussed with the patient and family. After consideration of risks, benefits and other options for treatment, the patient has consented to  Procedure(s): BRACHIOCEPHALIC ARTERIOVENOUS (AV) FISTULA CREATION (Left) as a surgical intervention .  The patient's history has been reviewed, patient examined, no change in status, stable for surgery.  I have reviewed the patient's chart and labs.  Questions were answered to the patient's satisfaction.     [name]

## 2015-04-08 NOTE — Progress Notes (Signed)
Spoke with Dr.Judd about pt. ICD device. Pt. States that the device has not been checked in several years. He also has not seen his cardiologist in several years.  [place] rep notified and in to check the device.

## 2015-04-08 NOTE — Op Note (Signed)
OPERATIVE REPORT  Date of Surgery: 04/08/2015  Surgeon: [name], MD  Assistant: Nurse  Pre-op Diagnosis: Stage IV Chronic Kidney Disease N18.4  Post-op Diagnosis: chronic kidney disease  Procedure: Procedure(s): Creation of Left arm BRACHIOCEPHALIC ARTERIOVENOUS  FISTULA   Anesthesia: Mac  EBL: Minimal  Complications: None  Procedure Details: The patient was taken to the operating room placed in supine position at which time satisfactory IV sedation was performed by anesthesia. Left upper extremity was prepped with Betadine scrub and solution draped in routine sterile manner. After infiltration of Xylocaine with epinephrine transverse incision was made in the antecubital area. Antecubital vein dissected free cephalic vein dissected down to its junction with the basilic vein was ligated with 2-0 silk tie and divided. It was gently dilated with heparinized saline was a satisfactory vein about 3 mm in size. Brachial artery was then exposed beneath the fascia and encircled with Vesseloops. It was a 3 mm artery with a good pulse. The artery was occluded proximally and distally with vascular clamps longitudinal opening made 15 blade extended with Potts scissors. There was excellent inflow present. Vein was carefully measured spatulated and anastomosed end to side with 6-0 Prolene. Clamps then released there was excellent pulse and palpable thrill up to the upper arm area with excellent Doppler flow. There were slight diminution of flow in the radial and ulnar arteries with the discharge open it improved slightly with compression of the fistula. No heparin or protamine was given. The fistula was then imaged using B mode ultrasound looking for side branches no significant branches were noted in the upper arm. Adequate hemostasis was achieved the wound closed in layers with Vicryl in subcuticular fashion with Dermabond patient taken to recovery in satisfactory condition   [name],  MD 04/08/2015 1:49 PM

## 2015-04-08 NOTE — OR Nursing (Signed)
preop assessment performed by Delena Serve Rn and documented as she reported

## 2015-04-08 NOTE — H&P (View-Only) (Signed)
Subjective:     Patient ID: [name], male   DOB: 04/19/1955, 60 y.o.   MRN: [id number]  HPI this 60 year old male with chronic kidney disease stage IV was referred by Dr.Befacadu for evaluation for vascular access. Patient is right-handed. He has had bilateral below-knee amputations in the past from diabetes mellitus. He is not quite ready to proceed with vascular access.  Past Medical History  Diagnosis Date  . Nonischemic cardiomyopathy ([place])   . Hyperlipidemia   . Hypertension   . Diabetes mellitus   . Tobacco abuse   . Cerebrovascular disease   . History of TIAs   . PVD (peripheral vascular disease) ([place])   . Amputated below knee ([facility])     Right  . Skin disease     Rare  . Chronic kidney disease     Stage IV  . CHF (congestive heart failure) ([place])   . Anemia     Social History  Substance Use Topics  . Smoking status: Former Smoker -- 1.00 packs/day for 20 years  . Smokeless tobacco: Never Used  . Alcohol Use: 0.0 oz/week    0 Standard drinks or equivalent per week     Comment: Occasional    Family History  Problem Relation Age of Onset  . Heart failure Mother   . Diabetes Mother   . Diabetes Father   . Stroke Father   . Diabetes Brother   . Diabetes Brother   . Diabetes Brother   . Diabetes Brother   . Diabetes Other   . Coronary artery disease Other     Allergies  Allergen Reactions  . Contrast Media [Iodinated Diagnostic Agents]      Current outpatient prescriptions:  .  amLODipine (NORVASC) 10 MG tablet, Take 10 mg by mouth daily.  , Disp: , Rfl:  .  aspirin 81 MG tablet, Take 81 mg by mouth daily.  , Disp: , Rfl:  .  carvedilol (COREG) 25 MG tablet, Take 1 tablet (25 mg total) by mouth 2 (two) times daily., Disp: 60 tablet, Rfl: 0 .  Choline Fenofibrate (TRILIPIX) 135 MG capsule, Take 135 mg by mouth daily.  , Disp: , Rfl:  .  cloNIDine (CATAPRES) 0.1 MG tablet, take 1 tablet by mouth twice a day, Disp: 60 tablet, Rfl: 5 .  clopidogrel (PLAVIX)  75 MG tablet, Take 75 mg by mouth daily.  , Disp: , Rfl:  .  furosemide (LASIX) 20 MG tablet, Take 20 mg by mouth 3 (three) times daily.  , Disp: , Rfl:  .  insulin detemir (LEVEMIR) 100 UNIT/ML injection, Inject into the skin 2 (two) times daily.  , Disp: , Rfl:  .  Insulin Glargine (LANTUS SOLOSTAR [place]), Inject into the skin., Disp: , Rfl:  .  insulin glulisine (APIDRA) 100 UNIT/ML injection, Inject into the skin as directed.  , Disp: , Rfl:  .  spironolactone (ALDACTONE) 25 MG tablet, Take 25 mg by mouth 2 (two) times daily., Disp: , Rfl:  .  metFORMIN (GLUCOPHAGE) 500 MG tablet, Take 500 mg by mouth 2 (two) times daily with a meal.  , Disp: , Rfl:  .  omega-3 acid ethyl esters (LOVAZA) 1 G capsule, Take 1 capsule by mouth 2 (two) times daily.  , Disp: , Rfl:  .  potassium chloride (KLOR-CON) 20 MEQ packet, Take 20 mEq by mouth 2 (two) times daily.  , Disp: , Rfl:  .  pregabalin (LYRICA) 100 MG capsule, Take 100 mg by mouth  2 (two) times daily.  , Disp: , Rfl:  .  vitamin B-12 (CYANOCOBALAMIN) 1000 MCG tablet, Take 1,000 mcg by mouth daily.  , Disp: , Rfl:   Filed Vitals:   03/10/15 1601 03/10/15 1609  BP: 164/72 158/78  Pulse: 74 74  Temp: 98.8 F (37.1 C)   Resp: 18   Weight: 260 lb (117.935 kg)   SpO2: 99%     Body mass index is 35.25 kg/(m^2).           Review of Systems has history of diabetes mellitus, chronic renal insufficiency, GERD, ongoing tobacco abuse, hypertension,    Objective:   Physical Exam BP 158/78 mmHg  Pulse 74  Temp(Src) 98.8 F (37.1 C)  Resp 18  Wt 260 lb (117.935 kg)  SpO2 99%  Gen.-alert and oriented x3 in no apparent distress HEENT normal for age Lungs no rhonchi or wheezing Cardiovascular regular rhythm no murmurs carotid pulses 3+ palpable no bruits audible Abdomen soft nontender no palpable masses Musculoskeletal free of  major deformities Skin clear -no rashes Neurologic normal Lower extremities 3+ femoral pulses. Bilateral  below-knee amputations with 3+ brachial and 2+ radial pulse palpable bilaterally.  Today I ordered bilateral vein mapping which I reviewed and interpreted. The cephalic vein in the left upper arm appears satisfactory for fistula creation      Assessment:     chronic kidney disease stage IV-needs vascular access    Plan:     will plan left brachial-cephalic AV fistula as soon as patient consents to proceed

## 2015-04-08 NOTE — Transfer of Care (Signed)
Immediate Anesthesia Transfer of Care Note  Patient: [name]  Procedure(s) Performed: Procedure(s): Creation of Left arm BRACHIOCEPHALIC ARTERIOVENOUS  FISTULA  (Left)  Patient Location: PACU  Anesthesia Type:MAC  Level of Consciousness: awake, alert , oriented and patient cooperative  Airway & Oxygen Therapy: Patient Spontanous Breathing and Patient connected to face mask oxygen  Post-op Assessment: Report given to RN and Post -op Vital signs reviewed and stable  Post vital signs: Reviewed and stable  Last Vitals:  Filed Vitals:   04/08/15 0947 04/08/15 1356  BP: 180/76   Pulse: 80 74  Temp: 36.8 C 36.5 C  Resp: 20     Complications: No apparent anesthesia complications

## 2015-04-09 ENCOUNTER — Telehealth: Payer: Self-pay | Admitting: Vascular Surgery

## 2015-04-09 ENCOUNTER — Encounter (HOSPITAL_COMMUNITY): Payer: Self-pay | Admitting: Vascular Surgery

## 2015-04-09 NOTE — Telephone Encounter (Signed)
-----   Message from Mena Goes, RN sent at 04/08/2015  1:59 PM EST ----- Regarding: schedule   ----- Message -----    From: Mal Misty, MD    Sent: 04/08/2015   1:52 PM      To: [facility] Charge Pool  04/08/2015 Surgeon Dr. [name] Asst. nurse Creation left brachialcephalic AV fistula  Please make patient appointment to see me in 6 weeks with  left brachial cephalic AV fistuladuplex scan performed at the time of the appointment

## 2015-04-09 NOTE — Telephone Encounter (Signed)
Spoke with pt re appt, dpm  °

## 2015-04-10 ENCOUNTER — Encounter (HOSPITAL_COMMUNITY)
Admission: RE | Admit: 2015-04-10 | Discharge: 2015-04-10 | Disposition: A | Discharge status: Home / Self Care | Payer: Medicare Other | Source: Ambulatory Visit | Attending: Ophthalmology | Admitting: Ophthalmology

## 2015-04-10 ENCOUNTER — Encounter (HOSPITAL_COMMUNITY): Payer: Self-pay

## 2015-04-10 MED ORDER — FENTANYL CITRATE (PF) 100 MCG/2ML IJ SOLN: 25.0000 ug | INTRAMUSCULAR | Status: DC | PRN

## 2015-04-10 MED ORDER — ONDANSETRON HCL 4 MG/2ML IJ SOLN: 4.0000 mg | Freq: Once | INTRAMUSCULAR | Status: DC | PRN

## 2015-04-14 ENCOUNTER — Ambulatory Visit (HOSPITAL_COMMUNITY): Admission: RE | Admit: 2015-04-14 | Payer: Medicare Other | Source: Ambulatory Visit | Admitting: Ophthalmology

## 2015-04-14 SURGERY — PHACOEMULSIFICATION, CATARACT, WITH IOL INSERTION
Anesthesia: Monitor Anesthesia Care | Laterality: Left

## 2015-05-06 ENCOUNTER — Encounter: Payer: Self-pay | Admitting: Vascular Surgery

## 2015-05-12 ENCOUNTER — Encounter: Payer: Medicare Other | Admitting: Vascular Surgery

## 2015-05-12 ENCOUNTER — Encounter (HOSPITAL_COMMUNITY): Payer: Medicare Other

## 2015-05-13 DIAGNOSIS — H109 Unspecified conjunctivitis: Secondary | ICD-10-CM | POA: Diagnosis not present

## 2015-05-13 DIAGNOSIS — I10 Essential (primary) hypertension: Secondary | ICD-10-CM | POA: Diagnosis not present

## 2015-05-13 DIAGNOSIS — Z794 Long term (current) use of insulin: Secondary | ICD-10-CM | POA: Diagnosis not present

## 2015-05-14 ENCOUNTER — Emergency Department (HOSPITAL_COMMUNITY)
Admission: EM | Admit: 2015-05-14 | Discharge: 2015-05-14 | Disposition: A | Discharge status: Home / Self Care | Payer: Medicare Other | Attending: Emergency Medicine | Admitting: Emergency Medicine

## 2015-05-14 ENCOUNTER — Encounter (HOSPITAL_COMMUNITY): Payer: Self-pay

## 2015-05-14 DIAGNOSIS — Z862 Personal history of diseases of the blood and blood-forming organs and certain disorders involving the immune mechanism: Secondary | ICD-10-CM | POA: Diagnosis not present

## 2015-05-14 DIAGNOSIS — H538 Other visual disturbances: Secondary | ICD-10-CM | POA: Diagnosis not present

## 2015-05-14 DIAGNOSIS — Z7982 Long term (current) use of aspirin: Secondary | ICD-10-CM | POA: Diagnosis not present

## 2015-05-14 DIAGNOSIS — Z95810 Presence of automatic (implantable) cardiac defibrillator: Secondary | ICD-10-CM | POA: Insufficient documentation

## 2015-05-14 DIAGNOSIS — H53149 Visual discomfort, unspecified: Secondary | ICD-10-CM | POA: Insufficient documentation

## 2015-05-14 DIAGNOSIS — Z79899 Other long term (current) drug therapy: Secondary | ICD-10-CM | POA: Diagnosis not present

## 2015-05-14 DIAGNOSIS — Z9889 Other specified postprocedural states: Secondary | ICD-10-CM | POA: Insufficient documentation

## 2015-05-14 DIAGNOSIS — E785 Hyperlipidemia, unspecified: Secondary | ICD-10-CM | POA: Diagnosis not present

## 2015-05-14 DIAGNOSIS — M1990 Unspecified osteoarthritis, unspecified site: Secondary | ICD-10-CM | POA: Diagnosis not present

## 2015-05-14 DIAGNOSIS — I509 Heart failure, unspecified: Secondary | ICD-10-CM | POA: Insufficient documentation

## 2015-05-14 DIAGNOSIS — H578 Other specified disorders of eye and adnexa: Secondary | ICD-10-CM | POA: Insufficient documentation

## 2015-05-14 DIAGNOSIS — Z89511 Acquired absence of right leg below knee: Secondary | ICD-10-CM | POA: Insufficient documentation

## 2015-05-14 DIAGNOSIS — E119 Type 2 diabetes mellitus without complications: Secondary | ICD-10-CM | POA: Insufficient documentation

## 2015-05-14 DIAGNOSIS — I129 Hypertensive chronic kidney disease with stage 1 through stage 4 chronic kidney disease, or unspecified chronic kidney disease: Secondary | ICD-10-CM | POA: Diagnosis not present

## 2015-05-14 DIAGNOSIS — Z7902 Long term (current) use of antithrombotics/antiplatelets: Secondary | ICD-10-CM | POA: Diagnosis not present

## 2015-05-14 DIAGNOSIS — Z794 Long term (current) use of insulin: Secondary | ICD-10-CM | POA: Diagnosis not present

## 2015-05-14 DIAGNOSIS — Z87891 Personal history of nicotine dependence: Secondary | ICD-10-CM | POA: Diagnosis not present

## 2015-05-14 DIAGNOSIS — Z872 Personal history of diseases of the skin and subcutaneous tissue: Secondary | ICD-10-CM | POA: Diagnosis not present

## 2015-05-14 DIAGNOSIS — H5711 Ocular pain, right eye: Secondary | ICD-10-CM | POA: Insufficient documentation

## 2015-05-14 DIAGNOSIS — N184 Chronic kidney disease, stage 4 (severe): Secondary | ICD-10-CM | POA: Insufficient documentation

## 2015-05-14 DIAGNOSIS — Z8673 Personal history of transient ischemic attack (TIA), and cerebral infarction without residual deficits: Secondary | ICD-10-CM | POA: Diagnosis not present

## 2015-05-14 DIAGNOSIS — Z89512 Acquired absence of left leg below knee: Secondary | ICD-10-CM | POA: Diagnosis not present

## 2015-05-14 MED ORDER — TETRACAINE HCL 0.5 % OP SOLN
2.0000 [drp] | Freq: Once | OPHTHALMIC | Status: AC
  Administered 2015-05-14: 2 [drp] via OPHTHALMIC
  Filled 2015-05-14: qty 4

## 2015-05-14 MED ORDER — PREDNISOLONE ACETATE 1 % OP SUSP
OPHTHALMIC | Status: AC
  Filled 2015-05-14: qty 5

## 2015-05-14 MED ORDER — PREDNISOLONE ACETATE 1 % OP SUSP
2.0000 [drp] | Freq: Once | OPHTHALMIC | Status: AC
  Administered 2015-05-14: 2 [drp] via OPHTHALMIC
  Filled 2015-05-14: qty 1

## 2015-05-14 NOTE — ED Notes (Signed)
Cataract surgery on  Dec here at [facility]

## 2015-05-14 NOTE — ED Notes (Signed)
Patient began to have eye pain with drainage last Friday. Patient states he saw his eye doctor and was prescribed eye drops but didn't get the medication filled D/T cost of medication. Patient states [facility] vision.

## 2015-05-14 NOTE — ED Provider Notes (Signed)
CSN: [id number]     Arrival date & time 05/14/15  1907 History   First MD Initiated Contact with Patient 05/14/15 1931     Chief Complaint  Patient presents with  . Eye Problem     (Consider location/radiation/quality/duration/timing/severity/associated sxs/prior Treatment) HPI   JASMAN CALLEY is a 60 y.o. male with hx of catract surgery in Dec to right eye, presents to the Emergency Department complaining of right eye pain, redness and excessive tearing for one week.  He states he was seen by his eye doctor two days ago and dispensed one drop and prescribed another drop that he did not get filled due to cost. He comes to ER because symptoms are not improving and he reports blurred vision of the eye.  He denies headaches, dizziness, visual changes of the left eye and fever.    Past Medical History  Diagnosis Date  . Nonischemic cardiomyopathy ([place])   . Hyperlipidemia   . Hypertension   . Tobacco abuse   . Cerebrovascular disease   . History of TIAs   . PVD (peripheral vascular disease) ([place])   . Amputated below knee ([facility])     Right  . Skin disease     Rare  . CHF (congestive heart failure) ([place])   . Anemia   . AICD (automatic cardioverter/defibrillator) present   . Stroke [facility])     "light stroke"  . Chronic kidney disease     Stage IV  . Shortness of breath dyspnea     with exertion   . Diabetes mellitus     type II  . Arthritis    Past Surgical History  Procedure Laterality Date  . Cardiac defibrillator placement    . Leg amputation below knee      bilateral  . Cardiac defibrillator placement    . Cataract extraction w/phaco Right 03/17/2015    Procedure: CATARACT EXTRACTION PHACO AND INTRAOCULAR LENS PLACEMENT (IOC);  Surgeon: Rutherford Guys, MD;  Location: [facility];  Service: Ophthalmology;  Laterality: Right;  CDE: 6.59  . Eye surgery Right     Cataract  . Av fistula placement Left 04/08/2015    Procedure: Creation of Left arm BRACHIOCEPHALIC ARTERIOVENOUS  FISTULA ;   Surgeon: Mal Misty, MD;  Location: [facility] OR;  Service: Vascular;  Laterality: Left;   Family History  Problem Relation Age of Onset  . Heart failure Mother   . Diabetes Mother   . Diabetes Father   . Stroke Father   . Diabetes Brother   . Diabetes Brother   . Diabetes Brother   . Diabetes Brother   . Diabetes Other   . Coronary artery disease Other    Social History  Substance Use Topics  . Smoking status: Former Smoker -- 1.00 packs/day for 20 years    Quit date: 03/12/1998  . Smokeless tobacco: Never Used  . Alcohol Use: No    Review of Systems  Constitutional: Negative for fever and appetite change.  HENT: Negative for congestion and sore throat.   Eyes: Positive for photophobia, pain, redness and visual disturbance.  Respiratory: Negative for shortness of breath.   Cardiovascular: Negative for chest pain.  Neurological: Negative for dizziness, syncope, weakness, numbness and headaches.      Allergies  Contrast media  Home Medications   Prior to Admission medications   Medication Sig Start Date End Date Taking? Authorizing Provider  acetaminophen (TYLENOL) 500 MG tablet Take 1,000 mg by mouth daily as needed for mild pain  or headache.     Historical Provider, MD  amLODipine (NORVASC) 10 MG tablet Take 10 mg by mouth daily.      Historical Provider, MD  aspirin 81 MG tablet Take 81 mg by mouth daily.      Historical Provider, MD  calcium acetate (PHOSLO) 667 MG capsule Take 667 mg by mouth daily. 03/25/15   Historical Provider, MD  carvedilol (COREG) 25 MG tablet Take 1 tablet (25 mg total) by mouth 2 (two) times daily. 03/02/11   Evans Lance, MD  cloNIDine (CATAPRES) 0.1 MG tablet take 1 tablet by mouth twice a day 06/13/11   Evans Lance, MD  clopidogrel (PLAVIX) 75 MG tablet Take 75 mg by mouth daily.      Historical Provider, MD  fenofibrate 160 MG tablet Take 160 mg by mouth daily. 03/09/15   Historical Provider, MD  furosemide (LASIX) 20 MG tablet Take  20 mg by mouth 2 (two) times daily.     Historical Provider, MD  HUMALOG KWIKPEN 100 UNIT/ML KiwkPen Inject 10 Units into the skin 3 (three) times daily.  04/01/15   Historical Provider, MD  Insulin Glargine (LANTUS SOLOSTAR [place]) Inject 50 Units into the skin at bedtime.     Historical Provider, MD  oxyCODONE (OXY IR/ROXICODONE) 5 MG immediate release tablet Take 1 tablet (5 mg total) by mouth at bedtime as needed for moderate pain. 04/08/15   Ulyses Amor, PA-C  rosuvastatin (CRESTOR) 20 MG tablet Take 20 mg by mouth daily. 02/24/15   Historical Provider, MD  spironolactone (ALDACTONE) 25 MG tablet Take 25 mg by mouth 2 (two) times daily.    Historical Provider, MD  Vitamin D, Ergocalciferol, (DRISDOL) 50000 UNITS CAPS capsule Take 50,000 Units by mouth once a week. 02/12/15   Historical Provider, MD   BP 206/85 mmHg  Pulse 76  Temp(Src) 97.9 F (36.6 C) (Oral)  Resp 20  Ht 6' (1.829 m)  Wt 117.935 kg  BMI 35.25 kg/m2  SpO2 99% Physical Exam  Constitutional: He is oriented to person, place, and time. He appears well-developed and well-nourished. No distress.  HENT:  Head: Atraumatic.  Mouth/Throat: Oropharynx is clear and moist.  Eyes: EOM are normal. Pupils are equal, round, and reactive to light. Lids are everted and swept, no foreign bodies found. Right eye exhibits no chemosis and no exudate. No foreign body present in the right eye. Left eye exhibits no chemosis. Right conjunctiva is injected. Left conjunctiva is not injected.  Fundoscopic exam:      The right eye shows no papilledema.  Slit lamp exam:      The right eye shows no corneal abrasion, no corneal flare, no corneal ulcer, no foreign body, no fluorescein uptake and no anterior chamber bulge.  Neck: Normal range of motion. Neck supple.  Cardiovascular: Normal rate and regular rhythm.   No murmur heard. Pulmonary/Chest: Effort normal and breath sounds normal. No respiratory distress.  Musculoskeletal:  Bilateral BKA   Lymphadenopathy:    He has no cervical adenopathy.  Neurological: He is alert and oriented to person, place, and time. He exhibits normal muscle tone. Coordination normal.  Skin: No rash noted.  Psychiatric: He has a normal mood and affect. Thought content normal.  Nursing note and vitals reviewed.   ED Course  Procedures (including critical care time) Labs Review Labs Reviewed - No data to display  Imaging Review No results found. I have personally reviewed and evaluated these images and lab results as part  of my medical decision-making.   EKG Interpretation None        Visual Acuity  Right Eye Distance: none Left Eye Distance: 20/25 Bilateral Distance:    Right Eye Near:   Left Eye Near:    Bilateral Near:      MDM   Final diagnoses:  Eye pain, right    IOP of right eye:  16, 16, 13 mm Hg   Pt is well appearing, vitals stable. 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   Pt seen by his ophthalmology two days ago and prescribed a steroid eye drop that he did not get filled due to cost.  Confirmed eye drop that was prescribed by pharmacy tech.   2 drops of pred forte applied to right eye.  Pt advised to contact his eye dr on Friday for f/u.  Pt stable for d/c     Kem Parkinson, PA-C 05/15/15 2208  Milton Ferguson, MD 05/18/15 [id number]

## 2015-05-17 ENCOUNTER — Encounter (HOSPITAL_COMMUNITY): Payer: Self-pay

## 2015-05-17 ENCOUNTER — Emergency Department (HOSPITAL_COMMUNITY)
Admission: EM | Admit: 2015-05-17 | Discharge: 2015-05-17 | Disposition: A | Discharge status: Home / Self Care | Payer: Medicare Other | Attending: Emergency Medicine | Admitting: Emergency Medicine

## 2015-05-17 DIAGNOSIS — Z792 Long term (current) use of antibiotics: Secondary | ICD-10-CM | POA: Insufficient documentation

## 2015-05-17 DIAGNOSIS — I129 Hypertensive chronic kidney disease with stage 1 through stage 4 chronic kidney disease, or unspecified chronic kidney disease: Secondary | ICD-10-CM | POA: Insufficient documentation

## 2015-05-17 DIAGNOSIS — H209 Unspecified iridocyclitis: Secondary | ICD-10-CM | POA: Diagnosis not present

## 2015-05-17 DIAGNOSIS — Z87891 Personal history of nicotine dependence: Secondary | ICD-10-CM | POA: Diagnosis not present

## 2015-05-17 DIAGNOSIS — Z79899 Other long term (current) drug therapy: Secondary | ICD-10-CM | POA: Diagnosis not present

## 2015-05-17 DIAGNOSIS — Z95810 Presence of automatic (implantable) cardiac defibrillator: Secondary | ICD-10-CM | POA: Insufficient documentation

## 2015-05-17 DIAGNOSIS — Z7902 Long term (current) use of antithrombotics/antiplatelets: Secondary | ICD-10-CM | POA: Diagnosis not present

## 2015-05-17 DIAGNOSIS — M1990 Unspecified osteoarthritis, unspecified site: Secondary | ICD-10-CM | POA: Insufficient documentation

## 2015-05-17 DIAGNOSIS — E785 Hyperlipidemia, unspecified: Secondary | ICD-10-CM | POA: Diagnosis not present

## 2015-05-17 DIAGNOSIS — Z794 Long term (current) use of insulin: Secondary | ICD-10-CM | POA: Diagnosis not present

## 2015-05-17 DIAGNOSIS — N184 Chronic kidney disease, stage 4 (severe): Secondary | ICD-10-CM | POA: Diagnosis not present

## 2015-05-17 DIAGNOSIS — Z8673 Personal history of transient ischemic attack (TIA), and cerebral infarction without residual deficits: Secondary | ICD-10-CM | POA: Insufficient documentation

## 2015-05-17 DIAGNOSIS — Z872 Personal history of diseases of the skin and subcutaneous tissue: Secondary | ICD-10-CM | POA: Diagnosis not present

## 2015-05-17 DIAGNOSIS — E119 Type 2 diabetes mellitus without complications: Secondary | ICD-10-CM | POA: Insufficient documentation

## 2015-05-17 DIAGNOSIS — Z862 Personal history of diseases of the blood and blood-forming organs and certain disorders involving the immune mechanism: Secondary | ICD-10-CM | POA: Diagnosis not present

## 2015-05-17 DIAGNOSIS — I509 Heart failure, unspecified: Secondary | ICD-10-CM | POA: Diagnosis not present

## 2015-05-17 DIAGNOSIS — H578 Other specified disorders of eye and adnexa: Secondary | ICD-10-CM | POA: Diagnosis present

## 2015-05-17 MED ORDER — TETRACAINE HCL 0.5 % OP SOLN
2.0000 [drp] | Freq: Once | OPHTHALMIC | Status: AC
  Administered 2015-05-17: 2 [drp] via OPHTHALMIC
  Filled 2015-05-17: qty 4

## 2015-05-17 MED ORDER — TRAMADOL HCL 50 MG PO TABS: 50.0000 mg | ORAL_TABLET | Freq: Four times a day (QID) | ORAL | Status: DC | PRN

## 2015-05-17 MED ORDER — FLUORESCEIN SODIUM 1 MG OP STRP
1.0000 | ORAL_STRIP | Freq: Once | OPHTHALMIC | Status: AC
  Administered 2015-05-17: 1 via OPHTHALMIC
  Filled 2015-05-17: qty 1

## 2015-05-17 NOTE — ED Provider Notes (Signed)
CSN: [id number]     Arrival date & time 05/17/15  1440 History   First MD Initiated Contact with Patient 05/17/15 1633     Chief Complaint  Patient presents with  . Eye Problem    HPI Patient presents to the emergency room with complaints of persistent eye discomfort. Symptoms started last week. He patient has had history of previous cataract surgery in that right eye.  He has been having redness to the eye associated with excessive tearing. Patient also has had decreased visual acuity and has not been able to see out of that eye for the past week. He went to see his eye doctor on Tuesday and was given a prescription for steroid eyedrops. He was not able to get it filled initially because of the cost. He came into the emergency room on Thursday because of persistent eye discomfort. He denies any trouble with any headaches. No fevers. No symptoms with his left eye. Patient has been having persistent pain in that right eye. The pain comes and goes and is especially worse in the mornings when it is exposed to bright lights. Currently he is not having any pain but came back into the emergency room for further evaluation because when he gets the pain it is severe.  He has not called his eye doctor and is scheduled to see him on Tuesday. Past Medical History  Diagnosis Date  . Nonischemic cardiomyopathy ([place])   . Hyperlipidemia   . Hypertension   . Tobacco abuse   . Cerebrovascular disease   . History of TIAs   . PVD (peripheral vascular disease) ([place])   . Amputated below knee (HCC)     Right  . Skin disease     Rare  . CHF (congestive heart failure) ([place])   . Anemia   . AICD (automatic cardioverter/defibrillator) present   . Stroke [facility])     "light stroke"  . Chronic kidney disease     Stage IV  . Shortness of breath dyspnea     with exertion   . Diabetes mellitus     type II  . Arthritis    Past Surgical History  Procedure Laterality Date  . Cardiac defibrillator placement    . Leg  amputation below knee      bilateral  . Cardiac defibrillator placement    . Cataract extraction w/phaco Right 03/17/2015    Procedure: CATARACT EXTRACTION PHACO AND INTRAOCULAR LENS PLACEMENT (IOC);  Surgeon: Rutherford Guys, MD;  Location: [facility];  Service: Ophthalmology;  Laterality: Right;  CDE: 6.59  . Eye surgery Right     Cataract  . Av fistula placement Left 04/08/2015    Procedure: Creation of Left arm BRACHIOCEPHALIC ARTERIOVENOUS  FISTULA ;  Surgeon: Mal Misty, MD;  Location[facility];  Service: Vascular;  Laterality: Left;   Family History  Problem Relation Age of Onset  . Heart failure Mother   . Diabetes Mother   . Diabetes Father   . Stroke Father   . Diabetes Brother   . Diabetes Brother   . Diabetes Brother   . Diabetes Brother   . Diabetes Other   . Coronary artery disease Other    Social History  Substance Use Topics  . Smoking status: Former Smoker -- 1.00 packs/day for 20 years    Quit date: 03/12/1998  . Smokeless tobacco: Never Used  . Alcohol Use: No    Review of Systems  All other systems reviewed and are negative.  Allergies  Contrast media  Home Medications   Prior to Admission medications   Medication Sig Start Date End Date Taking? Authorizing Provider  acetaminophen (TYLENOL) 500 MG tablet Take 1,000 mg by mouth daily as needed for mild pain or headache.    Yes Historical Provider, MD  amLODipine (NORVASC) 10 MG tablet Take 10 mg by mouth daily.     Yes Historical Provider, MD  aspirin 81 MG tablet Take 81 mg by mouth daily.     Yes Historical Provider, MD  brimonidine-timolol (COMBIGAN) 0.2-0.5 % ophthalmic solution Place 1 drop into the right eye every 12 (twelve) hours.   Yes Historical Provider, MD  calcium acetate (PHOSLO) 667 MG capsule Take 667 mg by mouth daily. With largest meal of the day 03/25/15  Yes Historical Provider, MD  carvedilol (COREG) 25 MG tablet Take 1 tablet (25 mg total) by mouth 2 (two) times daily. 03/02/11  Yes  Evans Lance, MD  cloNIDine (CATAPRES) 0.1 MG tablet take 1 tablet by mouth twice a day 06/13/11  Yes Evans Lance, MD  clopidogrel (PLAVIX) 75 MG tablet Take 75 mg by mouth daily.     Yes Historical Provider, MD  fenofibrate 160 MG tablet Take 160 mg by mouth daily. 03/09/15  Yes Historical Provider, MD  folic acid (FOLVITE) 1 MG tablet Take 1 mg by mouth daily.   Yes Historical Provider, MD  furosemide (LASIX) 20 MG tablet Take 80 mg by mouth 2 (two) times daily.    Yes Historical Provider, MD  HUMALOG KWIKPEN 100 UNIT/ML KiwkPen Inject 10 Units into the skin 3 (three) times daily.  04/01/15  Yes Historical Provider, MD  Insulin Glargine (LANTUS SOLOSTAR [place]) Inject 50 Units into the skin at bedtime.    Yes Historical Provider, MD  ofloxacin (OCUFLOX) 0.3 % ophthalmic solution Place 1 drop into the right eye 2 (two) times daily.   Yes Historical Provider, MD  oxyCODONE (OXY IR/ROXICODONE) 5 MG immediate release tablet Take 1 tablet (5 mg total) by mouth at bedtime as needed for moderate pain. 04/08/15  Yes Ulyses Amor, PA-C  prednisoLONE acetate (PRED FORTE) 1 % ophthalmic suspension Place 1 drop into the right eye every 2 (two) hours while awake.   Yes Historical Provider, MD  rosuvastatin (CRESTOR) 20 MG tablet Take 20 mg by mouth daily. 02/24/15  Yes Historical Provider, MD  spironolactone (ALDACTONE) 25 MG tablet Take 25 mg by mouth 2 (two) times daily.   Yes Historical Provider, MD  traMADol (ULTRAM) 50 MG tablet Take 1 tablet (50 mg total) by mouth every 6 (six) hours as needed. 05/17/15   Dorie Rank, MD  Vitamin D, Ergocalciferol, (DRISDOL) 50000 UNITS CAPS capsule Take 50,000 Units by mouth once a week. 02/12/15   Historical Provider, MD   BP 172/70 mmHg  Pulse 70  Temp(Src) 98.2 F (36.8 C) (Oral)  Resp 18  Ht 6' (1.829 m)  Wt 117.935 kg  BMI 35.25 kg/m2  SpO2 98% Physical Exam  Constitutional: He appears well-developed and well-nourished. No distress.  HENT:  Head:  Normocephalic and atraumatic.  Right Ear: External ear normal.  Left Ear: External ear normal.  Eyes: EOM are normal. Right eye exhibits no discharge and no exudate. Left eye exhibits no discharge and no exudate. Right conjunctiva is injected. No scleral icterus.  Pupils are 2-3 mm in size, decreased reactivity of the right pupil, cornea appears hazy compared to the left mild conjunctival injection, no purulent drainage  Neck: Neck  supple. No tracheal deviation present.  Cardiovascular: Normal rate.   Pulmonary/Chest: Effort normal. No stridor. No respiratory distress.  Musculoskeletal: He exhibits no edema.  Neurological: He is alert. Cranial nerve deficit: no gross deficits.  Skin: Skin is warm and dry. No rash noted.  Psychiatric: He has a normal mood and affect.  Nursing note and vitals reviewed.   ED Course  Procedures (including critical care time)   MDM   Final diagnoses:  Iritis   Suspect iritis associated with his cataract surgery.  Pt has had decreased vision prior to seeing his eye doctor.  No new findings except the persistent pain which brought him in.  No sign of infection.    Discussed case with Dr Yolanda Bonine.  Recommends continuing the steroid drops.  Follow up with Dr Gershon Crane early this coming week.    Dorie Rank, MD 05/17/15 1840

## 2015-05-17 NOTE — ED Notes (Signed)
Seen eye doctor Tuesday and was diagnosis with an eye infection started on antibiotic gtts, seen here Thursday states he was given "numbing drops that helped a lot", pt back today with increased "throbbing pain" and stating he has not been able to see out of that eye (his right eye) the last couple of weeks. Known Diabetic. Cataract surgery in December 2016

## 2015-05-19 DIAGNOSIS — H4051X2 Glaucoma secondary to other eye disorders, right eye, moderate stage: Secondary | ICD-10-CM | POA: Diagnosis not present

## 2015-05-19 DIAGNOSIS — H2512 Age-related nuclear cataract, left eye: Secondary | ICD-10-CM | POA: Diagnosis not present

## 2015-05-19 DIAGNOSIS — E113491 Type 2 diabetes mellitus with severe nonproliferative diabetic retinopathy without macular edema, right eye: Secondary | ICD-10-CM | POA: Diagnosis not present

## 2015-05-19 DIAGNOSIS — H578 Other specified disorders of eye and adnexa: Secondary | ICD-10-CM | POA: Diagnosis not present

## 2015-05-19 DIAGNOSIS — Z961 Presence of intraocular lens: Secondary | ICD-10-CM | POA: Diagnosis not present

## 2015-05-25 DIAGNOSIS — H2512 Age-related nuclear cataract, left eye: Secondary | ICD-10-CM | POA: Diagnosis not present

## 2015-05-25 DIAGNOSIS — I4891 Unspecified atrial fibrillation: Secondary | ICD-10-CM | POA: Diagnosis not present

## 2015-05-25 DIAGNOSIS — Z794 Long term (current) use of insulin: Secondary | ICD-10-CM | POA: Diagnosis not present

## 2015-05-25 DIAGNOSIS — H578 Other specified disorders of eye and adnexa: Secondary | ICD-10-CM | POA: Diagnosis not present

## 2015-05-25 DIAGNOSIS — I10 Essential (primary) hypertension: Secondary | ICD-10-CM | POA: Diagnosis not present

## 2015-05-25 DIAGNOSIS — Z9841 Cataract extraction status, right eye: Secondary | ICD-10-CM | POA: Diagnosis not present

## 2015-05-25 DIAGNOSIS — Z87891 Personal history of nicotine dependence: Secondary | ICD-10-CM | POA: Diagnosis not present

## 2015-05-25 DIAGNOSIS — Z961 Presence of intraocular lens: Secondary | ICD-10-CM | POA: Diagnosis not present

## 2015-05-25 DIAGNOSIS — E113499 Type 2 diabetes mellitus with severe nonproliferative diabetic retinopathy without macular edema, unspecified eye: Secondary | ICD-10-CM | POA: Diagnosis not present

## 2015-05-25 DIAGNOSIS — H4089 Other specified glaucoma: Secondary | ICD-10-CM | POA: Diagnosis not present

## 2015-05-25 DIAGNOSIS — H4051X2 Glaucoma secondary to other eye disorders, right eye, moderate stage: Secondary | ICD-10-CM | POA: Diagnosis not present

## 2015-05-25 DIAGNOSIS — E113513 Type 2 diabetes mellitus with proliferative diabetic retinopathy with macular edema, bilateral: Secondary | ICD-10-CM | POA: Diagnosis not present

## 2015-05-25 DIAGNOSIS — Z7982 Long term (current) use of aspirin: Secondary | ICD-10-CM | POA: Diagnosis not present

## 2015-05-29 DIAGNOSIS — Z89512 Acquired absence of left leg below knee: Secondary | ICD-10-CM | POA: Diagnosis not present

## 2015-05-29 DIAGNOSIS — E113591 Type 2 diabetes mellitus with proliferative diabetic retinopathy without macular edema, right eye: Secondary | ICD-10-CM | POA: Diagnosis not present

## 2015-05-29 DIAGNOSIS — H4089 Other specified glaucoma: Secondary | ICD-10-CM | POA: Diagnosis not present

## 2015-05-29 DIAGNOSIS — E1122 Type 2 diabetes mellitus with diabetic chronic kidney disease: Secondary | ICD-10-CM | POA: Diagnosis not present

## 2015-05-29 DIAGNOSIS — I129 Hypertensive chronic kidney disease with stage 1 through stage 4 chronic kidney disease, or unspecified chronic kidney disease: Secondary | ICD-10-CM | POA: Diagnosis not present

## 2015-05-29 DIAGNOSIS — I509 Heart failure, unspecified: Secondary | ICD-10-CM | POA: Diagnosis not present

## 2015-05-29 DIAGNOSIS — E785 Hyperlipidemia, unspecified: Secondary | ICD-10-CM | POA: Diagnosis not present

## 2015-05-29 DIAGNOSIS — H1589 Other disorders of sclera: Secondary | ICD-10-CM | POA: Diagnosis not present

## 2015-05-29 DIAGNOSIS — H4301 Vitreous prolapse, right eye: Secondary | ICD-10-CM | POA: Diagnosis not present

## 2015-05-29 DIAGNOSIS — H2512 Age-related nuclear cataract, left eye: Secondary | ICD-10-CM | POA: Diagnosis not present

## 2015-06-02 DIAGNOSIS — R6889 Other general symptoms and signs: Secondary | ICD-10-CM | POA: Diagnosis not present

## 2015-06-03 DIAGNOSIS — H4051X0 Glaucoma secondary to other eye disorders, right eye, stage unspecified: Secondary | ICD-10-CM | POA: Diagnosis not present

## 2015-06-03 DIAGNOSIS — H159 Unspecified disorder of sclera: Secondary | ICD-10-CM | POA: Diagnosis not present

## 2015-06-03 DIAGNOSIS — H4302 Vitreous prolapse, left eye: Secondary | ICD-10-CM | POA: Diagnosis not present

## 2015-06-03 DIAGNOSIS — E113591 Type 2 diabetes mellitus with proliferative diabetic retinopathy without macular edema, right eye: Secondary | ICD-10-CM | POA: Diagnosis not present

## 2015-06-04 DIAGNOSIS — Z48810 Encounter for surgical aftercare following surgery on the sense organs: Secondary | ICD-10-CM | POA: Diagnosis not present

## 2015-06-04 DIAGNOSIS — E113493 Type 2 diabetes mellitus with severe nonproliferative diabetic retinopathy without macular edema, bilateral: Secondary | ICD-10-CM | POA: Diagnosis not present

## 2015-06-04 DIAGNOSIS — I129 Hypertensive chronic kidney disease with stage 1 through stage 4 chronic kidney disease, or unspecified chronic kidney disease: Secondary | ICD-10-CM | POA: Diagnosis not present

## 2015-06-04 DIAGNOSIS — I4891 Unspecified atrial fibrillation: Secondary | ICD-10-CM | POA: Diagnosis not present

## 2015-06-04 DIAGNOSIS — E1136 Type 2 diabetes mellitus with diabetic cataract: Secondary | ICD-10-CM | POA: Diagnosis not present

## 2015-06-04 DIAGNOSIS — I509 Heart failure, unspecified: Secondary | ICD-10-CM | POA: Diagnosis not present

## 2015-06-04 DIAGNOSIS — H4051X2 Glaucoma secondary to other eye disorders, right eye, moderate stage: Secondary | ICD-10-CM | POA: Diagnosis not present

## 2015-06-04 DIAGNOSIS — I110 Hypertensive heart disease with heart failure: Secondary | ICD-10-CM | POA: Diagnosis not present

## 2015-06-04 DIAGNOSIS — N189 Chronic kidney disease, unspecified: Secondary | ICD-10-CM | POA: Diagnosis not present

## 2015-06-04 DIAGNOSIS — E1122 Type 2 diabetes mellitus with diabetic chronic kidney disease: Secondary | ICD-10-CM | POA: Diagnosis not present

## 2015-06-10 DIAGNOSIS — H109 Unspecified conjunctivitis: Secondary | ICD-10-CM | POA: Diagnosis not present

## 2015-06-10 DIAGNOSIS — Z89512 Acquired absence of left leg below knee: Secondary | ICD-10-CM | POA: Diagnosis not present

## 2015-06-10 DIAGNOSIS — F321 Major depressive disorder, single episode, moderate: Secondary | ICD-10-CM | POA: Diagnosis not present

## 2015-06-10 DIAGNOSIS — E1151 Type 2 diabetes mellitus with diabetic peripheral angiopathy without gangrene: Secondary | ICD-10-CM | POA: Diagnosis not present

## 2015-06-11 DIAGNOSIS — Z794 Long term (current) use of insulin: Secondary | ICD-10-CM | POA: Diagnosis not present

## 2015-06-11 DIAGNOSIS — Z87891 Personal history of nicotine dependence: Secondary | ICD-10-CM | POA: Diagnosis not present

## 2015-06-11 DIAGNOSIS — Z95810 Presence of automatic (implantable) cardiac defibrillator: Secondary | ICD-10-CM | POA: Diagnosis not present

## 2015-06-11 DIAGNOSIS — Z7982 Long term (current) use of aspirin: Secondary | ICD-10-CM | POA: Diagnosis not present

## 2015-06-11 DIAGNOSIS — I110 Hypertensive heart disease with heart failure: Secondary | ICD-10-CM | POA: Diagnosis not present

## 2015-06-11 DIAGNOSIS — Z79899 Other long term (current) drug therapy: Secondary | ICD-10-CM | POA: Diagnosis not present

## 2015-06-11 DIAGNOSIS — I509 Heart failure, unspecified: Secondary | ICD-10-CM | POA: Diagnosis not present

## 2015-06-11 DIAGNOSIS — E113493 Type 2 diabetes mellitus with severe nonproliferative diabetic retinopathy without macular edema, bilateral: Secondary | ICD-10-CM | POA: Diagnosis not present

## 2015-06-11 DIAGNOSIS — I4891 Unspecified atrial fibrillation: Secondary | ICD-10-CM | POA: Diagnosis not present

## 2015-06-11 DIAGNOSIS — H4051X2 Glaucoma secondary to other eye disorders, right eye, moderate stage: Secondary | ICD-10-CM | POA: Diagnosis not present

## 2015-06-12 ENCOUNTER — Encounter: Payer: Self-pay | Admitting: Vascular Surgery

## 2015-06-16 ENCOUNTER — Ambulatory Visit (INDEPENDENT_AMBULATORY_CARE_PROVIDER_SITE_OTHER): Payer: Self-pay | Admitting: Vascular Surgery

## 2015-06-16 ENCOUNTER — Ambulatory Visit (HOSPITAL_COMMUNITY)
Admission: RE | Admit: 2015-06-16 | Discharge: 2015-06-16 | Disposition: A | Discharge status: Home / Self Care | Payer: Medicare Other | Source: Ambulatory Visit | Attending: Vascular Surgery | Admitting: Vascular Surgery

## 2015-06-16 ENCOUNTER — Encounter: Payer: Self-pay | Admitting: Vascular Surgery

## 2015-06-16 VITALS — BP 173/89 | HR 79 | Temp 97.4°F | Ht 72.0 in | Wt 260.0 lb

## 2015-06-16 DIAGNOSIS — E785 Hyperlipidemia, unspecified: Secondary | ICD-10-CM | POA: Diagnosis not present

## 2015-06-16 DIAGNOSIS — N184 Chronic kidney disease, stage 4 (severe): Secondary | ICD-10-CM

## 2015-06-16 DIAGNOSIS — N186 End stage renal disease: Secondary | ICD-10-CM | POA: Insufficient documentation

## 2015-06-16 DIAGNOSIS — Z4931 Encounter for adequacy testing for hemodialysis: Secondary | ICD-10-CM | POA: Diagnosis not present

## 2015-06-16 DIAGNOSIS — E1122 Type 2 diabetes mellitus with diabetic chronic kidney disease: Secondary | ICD-10-CM | POA: Insufficient documentation

## 2015-06-16 DIAGNOSIS — I509 Heart failure, unspecified: Secondary | ICD-10-CM | POA: Diagnosis not present

## 2015-06-16 DIAGNOSIS — I132 Hypertensive heart and chronic kidney disease with heart failure and with stage 5 chronic kidney disease, or end stage renal disease: Secondary | ICD-10-CM | POA: Insufficient documentation

## 2015-06-16 NOTE — Progress Notes (Signed)
Subjective:     Patient ID: [name], male   DOB: November 05, 1955, 60 y.o.   MRN: [id number]  HPI This 60 year old male with chronic kidney disease stage IV had left brachial cephalic fistula created by me on April 08 2015. He denies any pain or significant numbness in the left hand. He is not yet on hemodialysis. He returns for initial follow-up.   Review of Systems     Objective:   Physical Exam BP 173/89 mmHg  Pulse 79  Temp(Src) 97.4 F (36.3 C) (Oral)  Ht 6' (1.829 m)  Wt 260 lb (117.935 kg)  BMI 35.25 kg/m2  SpO2 97%   Gen. Well-developed well-nourished male no apparent stress alert and oriented 3 in a wheelchair with bilateral amputations Left upper extremity with excellent pulse and palpable thrill and brachialcephalic AV fistula. Radial pulses 1-2+ which does improve with compression of fistula. No pain or numbness and left hand and appears adequately perfused.  Today I ordered a duplex scan of the fistula which reveals excellent flow and no significant side branches.     Assessment:      nicely maturing left brachial-cephalic AV fistula in patient with stage IV chronic kidney disease-not yet on hemodialysis -no evidence of significant steal symptoms     Plan:      should be able to use fistula in early April 2017 if needed but patient is not yet on dialysis Return to see me on when necessary basis

## 2015-07-21 DIAGNOSIS — E113491 Type 2 diabetes mellitus with severe nonproliferative diabetic retinopathy without macular edema, right eye: Secondary | ICD-10-CM | POA: Diagnosis not present

## 2015-09-04 DIAGNOSIS — H35051 Retinal neovascularization, unspecified, right eye: Secondary | ICD-10-CM | POA: Diagnosis not present

## 2015-09-04 DIAGNOSIS — H4051X2 Glaucoma secondary to other eye disorders, right eye, moderate stage: Secondary | ICD-10-CM | POA: Diagnosis not present

## 2015-09-04 DIAGNOSIS — E113593 Type 2 diabetes mellitus with proliferative diabetic retinopathy without macular edema, bilateral: Secondary | ICD-10-CM | POA: Diagnosis not present

## 2015-09-04 DIAGNOSIS — H578 Other specified disorders of eye and adnexa: Secondary | ICD-10-CM | POA: Diagnosis not present

## 2015-09-04 DIAGNOSIS — E113511 Type 2 diabetes mellitus with proliferative diabetic retinopathy with macular edema, right eye: Secondary | ICD-10-CM | POA: Diagnosis not present

## 2015-09-04 DIAGNOSIS — Z794 Long term (current) use of insulin: Secondary | ICD-10-CM | POA: Diagnosis not present

## 2015-09-04 DIAGNOSIS — Z961 Presence of intraocular lens: Secondary | ICD-10-CM | POA: Diagnosis not present

## 2015-09-04 DIAGNOSIS — Z7982 Long term (current) use of aspirin: Secondary | ICD-10-CM | POA: Diagnosis not present

## 2015-09-04 DIAGNOSIS — Z79899 Other long term (current) drug therapy: Secondary | ICD-10-CM | POA: Diagnosis not present

## 2015-10-02 DIAGNOSIS — H578 Other specified disorders of eye and adnexa: Secondary | ICD-10-CM | POA: Diagnosis not present

## 2015-10-02 DIAGNOSIS — H4051X2 Glaucoma secondary to other eye disorders, right eye, moderate stage: Secondary | ICD-10-CM | POA: Diagnosis not present

## 2015-10-02 DIAGNOSIS — Z87891 Personal history of nicotine dependence: Secondary | ICD-10-CM | POA: Diagnosis not present

## 2015-10-02 DIAGNOSIS — E113493 Type 2 diabetes mellitus with severe nonproliferative diabetic retinopathy without macular edema, bilateral: Secondary | ICD-10-CM | POA: Diagnosis not present

## 2015-10-02 DIAGNOSIS — Z7982 Long term (current) use of aspirin: Secondary | ICD-10-CM | POA: Diagnosis not present

## 2015-10-02 DIAGNOSIS — E113412 Type 2 diabetes mellitus with severe nonproliferative diabetic retinopathy with macular edema, left eye: Secondary | ICD-10-CM | POA: Diagnosis not present

## 2015-10-02 DIAGNOSIS — Z79899 Other long term (current) drug therapy: Secondary | ICD-10-CM | POA: Diagnosis not present

## 2015-10-02 DIAGNOSIS — H35052 Retinal neovascularization, unspecified, left eye: Secondary | ICD-10-CM | POA: Diagnosis not present

## 2015-10-02 DIAGNOSIS — Z794 Long term (current) use of insulin: Secondary | ICD-10-CM | POA: Diagnosis not present

## 2015-10-02 DIAGNOSIS — Z89511 Acquired absence of right leg below knee: Secondary | ICD-10-CM | POA: Diagnosis not present

## 2015-10-02 DIAGNOSIS — Z89512 Acquired absence of left leg below knee: Secondary | ICD-10-CM | POA: Diagnosis not present

## 2015-12-01 DIAGNOSIS — H578 Other specified disorders of eye and adnexa: Secondary | ICD-10-CM | POA: Diagnosis not present

## 2015-12-01 DIAGNOSIS — Z961 Presence of intraocular lens: Secondary | ICD-10-CM | POA: Diagnosis not present

## 2015-12-01 DIAGNOSIS — H4051X2 Glaucoma secondary to other eye disorders, right eye, moderate stage: Secondary | ICD-10-CM | POA: Diagnosis not present

## 2015-12-01 DIAGNOSIS — E113493 Type 2 diabetes mellitus with severe nonproliferative diabetic retinopathy without macular edema, bilateral: Secondary | ICD-10-CM | POA: Diagnosis not present

## 2015-12-01 DIAGNOSIS — H2512 Age-related nuclear cataract, left eye: Secondary | ICD-10-CM | POA: Diagnosis not present

## 2015-12-01 DIAGNOSIS — Z794 Long term (current) use of insulin: Secondary | ICD-10-CM | POA: Diagnosis not present

## 2015-12-08 DIAGNOSIS — E559 Vitamin D deficiency, unspecified: Secondary | ICD-10-CM | POA: Diagnosis not present

## 2015-12-08 DIAGNOSIS — R809 Proteinuria, unspecified: Secondary | ICD-10-CM | POA: Diagnosis not present

## 2015-12-08 DIAGNOSIS — I10 Essential (primary) hypertension: Secondary | ICD-10-CM | POA: Diagnosis not present

## 2015-12-08 DIAGNOSIS — N183 Chronic kidney disease, stage 3 (moderate): Secondary | ICD-10-CM | POA: Diagnosis not present

## 2015-12-08 DIAGNOSIS — Z79899 Other long term (current) drug therapy: Secondary | ICD-10-CM | POA: Diagnosis not present

## 2015-12-08 DIAGNOSIS — D509 Iron deficiency anemia, unspecified: Secondary | ICD-10-CM | POA: Diagnosis not present

## 2015-12-16 DIAGNOSIS — N185 Chronic kidney disease, stage 5: Secondary | ICD-10-CM | POA: Diagnosis not present

## 2015-12-16 DIAGNOSIS — E875 Hyperkalemia: Secondary | ICD-10-CM | POA: Diagnosis not present

## 2015-12-16 DIAGNOSIS — N250 Renal osteodystrophy: Secondary | ICD-10-CM | POA: Diagnosis not present

## 2015-12-16 DIAGNOSIS — I10 Essential (primary) hypertension: Secondary | ICD-10-CM | POA: Diagnosis not present

## 2015-12-16 DIAGNOSIS — E1129 Type 2 diabetes mellitus with other diabetic kidney complication: Secondary | ICD-10-CM | POA: Diagnosis not present

## 2015-12-16 DIAGNOSIS — I509 Heart failure, unspecified: Secondary | ICD-10-CM | POA: Diagnosis not present

## 2016-01-19 DIAGNOSIS — E782 Mixed hyperlipidemia: Secondary | ICD-10-CM | POA: Diagnosis not present

## 2016-01-19 DIAGNOSIS — I10 Essential (primary) hypertension: Secondary | ICD-10-CM | POA: Diagnosis not present

## 2016-01-19 DIAGNOSIS — N184 Chronic kidney disease, stage 4 (severe): Secondary | ICD-10-CM | POA: Diagnosis not present

## 2016-01-19 DIAGNOSIS — Z89612 Acquired absence of left leg above knee: Secondary | ICD-10-CM | POA: Diagnosis not present

## 2016-01-19 DIAGNOSIS — E119 Type 2 diabetes mellitus without complications: Secondary | ICD-10-CM | POA: Diagnosis not present

## 2016-01-19 DIAGNOSIS — Z89611 Acquired absence of right leg above knee: Secondary | ICD-10-CM | POA: Diagnosis not present

## 2016-01-21 DIAGNOSIS — D509 Iron deficiency anemia, unspecified: Secondary | ICD-10-CM | POA: Diagnosis not present

## 2016-01-21 DIAGNOSIS — Z79899 Other long term (current) drug therapy: Secondary | ICD-10-CM | POA: Diagnosis not present

## 2016-01-21 DIAGNOSIS — I10 Essential (primary) hypertension: Secondary | ICD-10-CM | POA: Diagnosis not present

## 2016-01-21 DIAGNOSIS — N183 Chronic kidney disease, stage 3 (moderate): Secondary | ICD-10-CM | POA: Diagnosis not present

## 2016-01-21 DIAGNOSIS — R809 Proteinuria, unspecified: Secondary | ICD-10-CM | POA: Diagnosis not present

## 2016-01-21 DIAGNOSIS — E559 Vitamin D deficiency, unspecified: Secondary | ICD-10-CM | POA: Diagnosis not present

## 2016-01-21 DIAGNOSIS — Z0000 Encounter for general adult medical examination without abnormal findings: Secondary | ICD-10-CM | POA: Diagnosis not present

## 2016-01-26 DIAGNOSIS — N184 Chronic kidney disease, stage 4 (severe): Secondary | ICD-10-CM | POA: Diagnosis not present

## 2016-01-26 DIAGNOSIS — E1129 Type 2 diabetes mellitus with other diabetic kidney complication: Secondary | ICD-10-CM | POA: Diagnosis not present

## 2016-01-26 DIAGNOSIS — I509 Heart failure, unspecified: Secondary | ICD-10-CM | POA: Diagnosis not present

## 2016-01-26 DIAGNOSIS — I10 Essential (primary) hypertension: Secondary | ICD-10-CM | POA: Diagnosis not present

## 2016-03-08 DIAGNOSIS — H4051X2 Glaucoma secondary to other eye disorders, right eye, moderate stage: Secondary | ICD-10-CM | POA: Diagnosis not present

## 2016-03-08 DIAGNOSIS — H2512 Age-related nuclear cataract, left eye: Secondary | ICD-10-CM | POA: Diagnosis not present

## 2016-03-08 DIAGNOSIS — E113493 Type 2 diabetes mellitus with severe nonproliferative diabetic retinopathy without macular edema, bilateral: Secondary | ICD-10-CM | POA: Diagnosis not present

## 2016-03-08 DIAGNOSIS — Z794 Long term (current) use of insulin: Secondary | ICD-10-CM | POA: Diagnosis not present

## 2016-03-08 DIAGNOSIS — Z961 Presence of intraocular lens: Secondary | ICD-10-CM | POA: Diagnosis not present

## 2016-03-08 DIAGNOSIS — H578 Other specified disorders of eye and adnexa: Secondary | ICD-10-CM | POA: Diagnosis not present

## 2016-05-24 DIAGNOSIS — D509 Iron deficiency anemia, unspecified: Secondary | ICD-10-CM | POA: Diagnosis not present

## 2016-05-24 DIAGNOSIS — E559 Vitamin D deficiency, unspecified: Secondary | ICD-10-CM | POA: Diagnosis not present

## 2016-05-24 DIAGNOSIS — I10 Essential (primary) hypertension: Secondary | ICD-10-CM | POA: Diagnosis not present

## 2016-05-24 DIAGNOSIS — Z79899 Other long term (current) drug therapy: Secondary | ICD-10-CM | POA: Diagnosis not present

## 2016-05-31 DIAGNOSIS — D509 Iron deficiency anemia, unspecified: Secondary | ICD-10-CM | POA: Diagnosis not present

## 2016-05-31 DIAGNOSIS — E875 Hyperkalemia: Secondary | ICD-10-CM | POA: Diagnosis not present

## 2016-05-31 DIAGNOSIS — R809 Proteinuria, unspecified: Secondary | ICD-10-CM | POA: Diagnosis not present

## 2016-05-31 DIAGNOSIS — N185 Chronic kidney disease, stage 5: Secondary | ICD-10-CM | POA: Diagnosis not present

## 2016-06-18 ENCOUNTER — Inpatient Hospital Stay (HOSPITAL_COMMUNITY)
Admission: EM | Admit: 2016-06-18 | Discharge: 2016-06-29 | DRG: 252 | Disposition: A | Discharge status: Home / Self Care | Payer: Medicare Other | Attending: Internal Medicine | Admitting: Internal Medicine

## 2016-06-18 ENCOUNTER — Encounter (HOSPITAL_COMMUNITY): Payer: Self-pay | Admitting: *Deleted

## 2016-06-18 ENCOUNTER — Emergency Department (HOSPITAL_COMMUNITY): Payer: Medicare Other

## 2016-06-18 DIAGNOSIS — Z961 Presence of intraocular lens: Secondary | ICD-10-CM | POA: Diagnosis present

## 2016-06-18 DIAGNOSIS — I428 Other cardiomyopathies: Secondary | ICD-10-CM | POA: Diagnosis not present

## 2016-06-18 DIAGNOSIS — E1151 Type 2 diabetes mellitus with diabetic peripheral angiopathy without gangrene: Secondary | ICD-10-CM | POA: Diagnosis present

## 2016-06-18 DIAGNOSIS — J9621 Acute and chronic respiratory failure with hypoxia: Secondary | ICD-10-CM | POA: Diagnosis present

## 2016-06-18 DIAGNOSIS — N179 Acute kidney failure, unspecified: Secondary | ICD-10-CM

## 2016-06-18 DIAGNOSIS — I5023 Acute on chronic systolic (congestive) heart failure: Secondary | ICD-10-CM | POA: Diagnosis not present

## 2016-06-18 DIAGNOSIS — I5043 Acute on chronic combined systolic (congestive) and diastolic (congestive) heart failure: Secondary | ICD-10-CM | POA: Diagnosis not present

## 2016-06-18 DIAGNOSIS — I160 Hypertensive urgency: Secondary | ICD-10-CM | POA: Diagnosis present

## 2016-06-18 DIAGNOSIS — E8770 Fluid overload, unspecified: Secondary | ICD-10-CM | POA: Diagnosis not present

## 2016-06-18 DIAGNOSIS — Z794 Long term (current) use of insulin: Secondary | ICD-10-CM

## 2016-06-18 DIAGNOSIS — N185 Chronic kidney disease, stage 5: Secondary | ICD-10-CM

## 2016-06-18 DIAGNOSIS — I509 Heart failure, unspecified: Secondary | ICD-10-CM

## 2016-06-18 DIAGNOSIS — Z89519 Acquired absence of unspecified leg below knee: Secondary | ICD-10-CM | POA: Diagnosis not present

## 2016-06-18 DIAGNOSIS — N184 Chronic kidney disease, stage 4 (severe): Secondary | ICD-10-CM | POA: Diagnosis present

## 2016-06-18 DIAGNOSIS — I132 Hypertensive heart and chronic kidney disease with heart failure and with stage 5 chronic kidney disease, or end stage renal disease: Principal | ICD-10-CM | POA: Diagnosis present

## 2016-06-18 DIAGNOSIS — Z89512 Acquired absence of left leg below knee: Secondary | ICD-10-CM | POA: Diagnosis not present

## 2016-06-18 DIAGNOSIS — Z7902 Long term (current) use of antithrombotics/antiplatelets: Secondary | ICD-10-CM

## 2016-06-18 DIAGNOSIS — Z9841 Cataract extraction status, right eye: Secondary | ICD-10-CM | POA: Diagnosis not present

## 2016-06-18 DIAGNOSIS — Z7982 Long term (current) use of aspirin: Secondary | ICD-10-CM

## 2016-06-18 DIAGNOSIS — Z23 Encounter for immunization: Secondary | ICD-10-CM | POA: Diagnosis not present

## 2016-06-18 DIAGNOSIS — J9601 Acute respiratory failure with hypoxia: Secondary | ICD-10-CM | POA: Diagnosis not present

## 2016-06-18 DIAGNOSIS — R069 Unspecified abnormalities of breathing: Secondary | ICD-10-CM | POA: Diagnosis not present

## 2016-06-18 DIAGNOSIS — Z8673 Personal history of transient ischemic attack (TIA), and cerebral infarction without residual deficits: Secondary | ICD-10-CM

## 2016-06-18 DIAGNOSIS — Z6832 Body mass index (BMI) 32.0-32.9, adult: Secondary | ICD-10-CM

## 2016-06-18 DIAGNOSIS — I10 Essential (primary) hypertension: Secondary | ICD-10-CM | POA: Diagnosis not present

## 2016-06-18 DIAGNOSIS — Z95810 Presence of automatic (implantable) cardiac defibrillator: Secondary | ICD-10-CM | POA: Diagnosis not present

## 2016-06-18 DIAGNOSIS — R0602 Shortness of breath: Secondary | ICD-10-CM | POA: Diagnosis not present

## 2016-06-18 DIAGNOSIS — Y832 Surgical operation with anastomosis, bypass or graft as the cause of abnormal reaction of the patient, or of later complication, without mention of misadventure at the time of the procedure: Secondary | ICD-10-CM | POA: Diagnosis not present

## 2016-06-18 DIAGNOSIS — N186 End stage renal disease: Secondary | ICD-10-CM | POA: Diagnosis not present

## 2016-06-18 DIAGNOSIS — E785 Hyperlipidemia, unspecified: Secondary | ICD-10-CM | POA: Diagnosis present

## 2016-06-18 DIAGNOSIS — N2581 Secondary hyperparathyroidism of renal origin: Secondary | ICD-10-CM | POA: Diagnosis not present

## 2016-06-18 DIAGNOSIS — I5020 Unspecified systolic (congestive) heart failure: Secondary | ICD-10-CM

## 2016-06-18 DIAGNOSIS — N281 Cyst of kidney, acquired: Secondary | ICD-10-CM | POA: Diagnosis not present

## 2016-06-18 DIAGNOSIS — Z89511 Acquired absence of right leg below knee: Secondary | ICD-10-CM | POA: Diagnosis not present

## 2016-06-18 DIAGNOSIS — Z87891 Personal history of nicotine dependence: Secondary | ICD-10-CM | POA: Diagnosis not present

## 2016-06-18 DIAGNOSIS — I739 Peripheral vascular disease, unspecified: Secondary | ICD-10-CM | POA: Diagnosis present

## 2016-06-18 DIAGNOSIS — I5042 Chronic combined systolic (congestive) and diastolic (congestive) heart failure: Secondary | ICD-10-CM

## 2016-06-18 DIAGNOSIS — Z79899 Other long term (current) drug therapy: Secondary | ICD-10-CM

## 2016-06-18 DIAGNOSIS — E1122 Type 2 diabetes mellitus with diabetic chronic kidney disease: Secondary | ICD-10-CM | POA: Diagnosis not present

## 2016-06-18 DIAGNOSIS — N250 Renal osteodystrophy: Secondary | ICD-10-CM | POA: Diagnosis present

## 2016-06-18 DIAGNOSIS — R062 Wheezing: Secondary | ICD-10-CM | POA: Diagnosis not present

## 2016-06-18 DIAGNOSIS — D638 Anemia in other chronic diseases classified elsewhere: Secondary | ICD-10-CM | POA: Diagnosis present

## 2016-06-18 DIAGNOSIS — T82898A Other specified complication of vascular prosthetic devices, implants and grafts, initial encounter: Secondary | ICD-10-CM | POA: Diagnosis not present

## 2016-06-18 DIAGNOSIS — E669 Obesity, unspecified: Secondary | ICD-10-CM | POA: Diagnosis present

## 2016-06-18 DIAGNOSIS — T82858A Stenosis of vascular prosthetic devices, implants and grafts, initial encounter: Secondary | ICD-10-CM | POA: Diagnosis not present

## 2016-06-18 DIAGNOSIS — D631 Anemia in chronic kidney disease: Secondary | ICD-10-CM | POA: Diagnosis not present

## 2016-06-18 DIAGNOSIS — Z992 Dependence on renal dialysis: Secondary | ICD-10-CM | POA: Diagnosis not present

## 2016-06-18 DIAGNOSIS — R9431 Abnormal electrocardiogram [ECG] [EKG]: Secondary | ICD-10-CM | POA: Diagnosis not present

## 2016-06-18 LAB — CBC WITH DIFFERENTIAL/PLATELET
BASOS PCT: 0 %
Basophils Absolute: 0 10*3/uL (ref 0.0–0.1)
EOS ABS: 0.2 10*3/uL (ref 0.0–0.7)
Eosinophils Relative: 2 %
HCT: 28.6 % — ABNORMAL LOW (ref 39.0–52.0)
Hemoglobin: 9 g/dL — ABNORMAL LOW (ref 13.0–17.0)
Lymphocytes Relative: 7 %
Lymphs Abs: 0.9 10*3/uL (ref 0.7–4.0)
MCH: 29.5 pg (ref 26.0–34.0)
MCHC: 31.5 g/dL (ref 30.0–36.0)
MCV: 93.8 fL (ref 78.0–100.0)
MONOS PCT: 4 %
Monocytes Absolute: 0.5 10*3/uL (ref 0.1–1.0)
Neutro Abs: 11 10*3/uL — ABNORMAL HIGH (ref 1.7–7.7)
Neutrophils Relative %: 87 %
PLATELETS: 228 10*3/uL (ref 150–400)
RBC: 3.05 MIL/uL — ABNORMAL LOW (ref 4.22–5.81)
RDW: 14.1 % (ref 11.5–15.5)
WBC: 12.6 10*3/uL — ABNORMAL HIGH (ref 4.0–10.5)

## 2016-06-18 MED ORDER — FUROSEMIDE 10 MG/ML IJ SOLN
80.0000 mg | Freq: Once | INTRAMUSCULAR | Status: AC
  Administered 2016-06-18: 80 mg via INTRAVENOUS
  Filled 2016-06-18: qty 8

## 2016-06-18 MED ORDER — ASPIRIN 81 MG PO CHEW
324.0000 mg | CHEWABLE_TABLET | Freq: Once | ORAL | Status: AC
  Administered 2016-06-18: 324 mg via ORAL
  Filled 2016-06-18: qty 4

## 2016-06-18 NOTE — ED Provider Notes (Signed)
[place] DEPT Provider Note   CSN: [id number] Arrival date & time: 06/18/16  2204   By signing my name below, I, Eunice Blase, attest that this documentation has been prepared under the direction and in the presence of Ezequiel Essex, MD. Electronically signed, Eunice Blase, ED Scribe. 06/18/16. 11:23 PM.   History   Chief Complaint Chief Complaint  Patient presents with  . Shortness of Breath   The history is provided by the patient and medical records. No language interpreter was used.    HPI Comments: [name] is a 61 y.o. male who presents to the Emergency Department complaining of SOB onset yesterday. He notes associated come and go chest tightness, 15-20 lb weight gain since 04/04/2016 and leg swelling. He notes he is able to breath better when sitting upright and he has to sleep in this position. Hx of stroke noted without lingering weakness. Pt has had a dialysis access since 04/04/2016 and will begin treatment soon. Hx of DM noted. He adds Hx of Heart failure controlled with lasix 80 mg twice per day and he is compliant. He further notes he was recently advised to quit taking aldactone 05/2016. He is not sure why. Pt also takes Plavix and baby aspirin regularly. He adds he quit smo Pt denies Hx of asthma or COPD, past hospital admissions, chest pain, fever, N/V/D and abdominal pain.  Past Medical History:  Diagnosis Date  . AICD (automatic cardioverter/defibrillator) present   . Amputated below knee (HCC)    Right  . Anemia   . Arthritis   . Cerebrovascular disease   . CHF (congestive heart failure) ([place])   . Chronic kidney disease    Stage IV  . Diabetes mellitus    type II  . History of TIAs   . Hyperlipidemia   . Hypertension   . Nonischemic cardiomyopathy ([place])   . PVD (peripheral vascular disease) ([place])   . Shortness of breath dyspnea    with exertion   . Skin disease    Rare  . Stroke [facility])    "light stroke"  . Tobacco abuse     Patient Active  Problem List   Diagnosis Date Noted  . Chronic kidney disease (CKD), stage IV (severe) ([place]) 03/10/2015  . ACHILLES BURSITIS OR TENDINITIS 09/14/2009  . PLANTAR FACIITIS 09/14/2009  . TIA 09/07/2009  . PVD 01/07/2009  . HYPERLIPIDEMIA 08/27/2008  . HYPERTENSION 08/27/2008    Past Surgical History:  Procedure Laterality Date  . AV FISTULA PLACEMENT Left 04/08/2015   Procedure: Creation of Left arm BRACHIOCEPHALIC ARTERIOVENOUS  FISTULA ;  Surgeon: Mal Misty, MD;  Location: [place];  Service: Vascular;  Laterality: Left;  . CARDIAC DEFIBRILLATOR PLACEMENT    . CARDIAC DEFIBRILLATOR PLACEMENT    . CATARACT EXTRACTION W/PHACO Right 03/17/2015   Procedure: CATARACT EXTRACTION PHACO AND INTRAOCULAR LENS PLACEMENT (IOC);  Surgeon: Rutherford Guys, MD;  Location: [facility];  Service: Ophthalmology;  Laterality: Right;  CDE: 6.59  . EYE SURGERY Right    Cataract  . LEG AMPUTATION BELOW KNEE     bilateral       Home Medications    Prior to Admission medications   Medication Sig Start Date End Date Taking? Authorizing Provider  acetaminophen (TYLENOL) 500 MG tablet Take 1,000 mg by mouth daily as needed for mild pain or headache.     Historical Provider, MD  amLODipine (NORVASC) 10 MG tablet Take 10 mg by mouth daily.      Historical Provider,  MD  aspirin 81 MG tablet Take 81 mg by mouth daily.      Historical Provider, MD  brimonidine-timolol (COMBIGAN) 0.2-0.5 % ophthalmic solution Place 1 drop into the right eye every 12 (twelve) hours.    Historical Provider, MD  calcium acetate (PHOSLO) 667 MG capsule Take 667 mg by mouth daily. With largest meal of the day 03/25/15   Historical Provider, MD  carvedilol (COREG) 25 MG tablet Take 1 tablet (25 mg total) by mouth 2 (two) times daily. 03/02/11   Evans Lance, MD  cloNIDine (CATAPRES) 0.1 MG tablet take 1 tablet by mouth twice a day Patient taking differently: take 1 tablet by mouth three times a day 06/13/11   Evans Lance, MD    clopidogrel (PLAVIX) 75 MG tablet Take 75 mg by mouth daily.      Historical Provider, MD  fenofibrate 160 MG tablet Take 160 mg by mouth daily. 03/09/15   Historical Provider, MD  folic acid (FOLVITE) 1 MG tablet Take 1 mg by mouth daily.    Historical Provider, MD  furosemide (LASIX) 20 MG tablet Take 80 mg by mouth 2 (two) times daily.     Historical Provider, MD  HUMALOG KWIKPEN 100 UNIT/ML KiwkPen Inject 10 Units into the skin 3 (three) times daily.  04/01/15   Historical Provider, MD  Insulin Glargine (LANTUS SOLOSTAR [place]) Inject 50 Units into the skin at bedtime.     Historical Provider, MD  ofloxacin (OCUFLOX) 0.3 % ophthalmic solution Place 1 drop into the right eye 2 (two) times daily.    Historical Provider, MD  oxyCODONE (OXY IR/ROXICODONE) 5 MG immediate release tablet Take 1 tablet (5 mg total) by mouth at bedtime as needed for moderate pain. 04/08/15   Ulyses Amor, PA-C  prednisoLONE acetate (PRED FORTE) 1 % ophthalmic suspension Place 1 drop into the right eye every 2 (two) hours while awake.    Historical Provider, MD  rosuvastatin (CRESTOR) 20 MG tablet Take 20 mg by mouth daily. 02/24/15   Historical Provider, MD  spironolactone (ALDACTONE) 25 MG tablet Take 25 mg by mouth 2 (two) times daily.    Historical Provider, MD  traMADol (ULTRAM) 50 MG tablet Take 1 tablet (50 mg total) by mouth every 6 (six) hours as needed. 05/17/15   Dorie Rank, MD  Vitamin D, Ergocalciferol, (DRISDOL) 50000 UNITS CAPS capsule Take 50,000 Units by mouth once a week. 02/12/15   Historical Provider, MD    Family History Family History  Problem Relation Age of Onset  . Diabetes Father   . Stroke Father   . Diabetes Brother   . Diabetes Brother   . Diabetes Brother   . Diabetes Brother   . Heart failure Mother   . Diabetes Mother   . Diabetes Other   . Coronary artery disease Other     Social History Social History  Substance Use Topics  . Smoking status: Former Smoker    Packs/day: 1.00     Years: 20.00    Quit date: 03/12/1998  . Smokeless tobacco: Never Used  . Alcohol use No     Allergies   Acetazolamide; Contrast media [iodinated diagnostic agents]; and Other   Review of Systems Review of Systems  All other systems reviewed and are negative.  A complete 10 system review of systems was obtained and all systems are negative except as noted in the HPI and PMH.    Physical Exam Updated Vital Signs BP (!) 170/78  Pulse 69   Temp 97.3 F (36.3 C) (Oral)   Resp (!) 24   Ht 6' (1.829 m)   Wt 250 lb (113.4 kg)   SpO2 93%   BMI 33.91 kg/m   Physical Exam  Constitutional: He is oriented to person, place, and time. He appears well-developed and well-nourished. He appears distressed.  dyspneic with conversation  HENT:  Head: Normocephalic and atraumatic.  Mouth/Throat: Oropharynx is clear and moist. No oropharyngeal exudate.  Eyes: Conjunctivae and EOM are normal. Pupils are equal, round, and reactive to light.  Neck: Normal range of motion. Neck supple.  No meningismus.  Cardiovascular: Normal rate, regular rhythm, normal heart sounds and intact distal pulses.   No murmur heard. Pulmonary/Chest: He is in respiratory distress. He has decreased breath sounds. He has rhonchi. He has rales.  Decreased breath sounds with scattered rales and rhonchi  Abdominal: Soft. Bowel sounds are normal. There is no tenderness. There is no rebound and no guarding.  Musculoskeletal: Normal range of motion. He exhibits edema.  Bilateral dka's and +1 edema of upper legs; dialysis fistula to LUE with thrill  Neurological: He is alert and oriented to person, place, and time. No cranial nerve deficit. He exhibits normal muscle tone. Coordination normal.   5/5 strength throughout. CN 2-12 intact.Equal grip strength.   Skin: Skin is warm.  Psychiatric: He has a normal mood and affect. His behavior is normal.  Nursing note and vitals reviewed.    ED Treatments / Results  DIAGNOSTIC  STUDIES: Oxygen Saturation is 93% on RA, low by my interpretation.    COORDINATION OF CARE: 11:12 PM Discussed treatment plan with pt at bedside and pt agreed to plan. Will order medications and fluids.  Labs (all labs ordered are listed, but only abnormal results are displayed) Labs Reviewed  CBC WITH DIFFERENTIAL/PLATELET - Abnormal; Notable for the following:       Result Value   WBC 12.6 (*)    RBC 3.05 (*)    Hemoglobin 9.0 (*)    HCT 28.6 (*)    Neutro Abs 11.0 (*)    All other components within normal limits  BASIC METABOLIC PANEL - Abnormal; Notable for the following:    Glucose, Bld 144 (*)    BUN 92 (*)    Creatinine, Ser 5.98 (*)    Calcium 8.2 (*)    GFR calc non Af Amer 9 (*)    GFR calc Af Amer 10 (*)    All other components within normal limits  TROPONIN I - Abnormal; Notable for the following:    Troponin I 0.03 (*)    All other components within normal limits  BRAIN NATRIURETIC PEPTIDE - Abnormal; Notable for the following:    B Natriuretic Peptide 1,237.0 (*)    All other components within normal limits  PROTIME-INR - Abnormal; Notable for the following:    Prothrombin Time 16.3 (*)    All other components within normal limits  BLOOD GAS, ARTERIAL - Abnormal; Notable for the following:    pH, Arterial 7.248 (*)    pCO2 arterial 51.6 (*)    Acid-base deficit 4.4 (*)    All other components within normal limits  TROPONIN I - Abnormal; Notable for the following:    Troponin I 0.04 (*)    All other components within normal limits  MRSA PCR SCREENING  TROPONIN I  TROPONIN I    EKG  EKG Interpretation  Date/Time:  Saturday June 18 2016 22:49:04 EDT Ventricular  Rate:  66 PR Interval:    QRS Duration: 101 QT Interval:  426 QTC Calculation: 447 R Axis:   12 Text Interpretation:  Sinus rhythm T waves now upright Confirmed by Wyvonnia Dusky  MD, Janelys Glassner [phone]) on 06/18/2016 10:58:28 PM       Radiology Dg Chest Port 1 View  Result Date:  06/18/2016 CLINICAL DATA:  Shortness of breath increasingly worse over the past 8 days. History of CHF, diabetes, hypertension, stroke, TIA, PVD. EXAM: PORTABLE CHEST 1 VIEW COMPARISON:  09/27/2010 FINDINGS: Cardiac pacemaker. Shallow inspiration. Cardiac enlargement with diffuse pulmonary vascular congestion. Hazy perihilar opacities likely representing early edema. Probable small right pleural effusion. No focal volume loss or consolidation. No pneumothorax. Calcification of the aorta. IMPRESSION: Likely congestive heart failure with cardiac enlargement, pulmonary vascular congestion, perihilar edema, and small right pleural effusion. Electronically Signed   By: Lucienne Capers M.D.   On: 06/18/2016 23:30    Procedures Procedures (including critical care time)  Medications Ordered in ED Medications  furosemide (LASIX) injection 80 mg (not administered)  aspirin chewable tablet 324 mg (not administered)     Initial Impression / Assessment and Plan / ED Course  I have reviewed the triage vital signs and the nursing notes.  Pertinent labs & imaging results that were available during my care of the patient were reviewed by me and considered in my medical decision making (see chart for details).   patient presents with 8 days of worsening shortness of breath with history of CHF. Also has CKD not yet started on dialysis. No chest pain.  Workup consistent with CHF with worsening creatinine, fluid overload and hypertensive urgency.  Patient given IV Lasix, nitroglycerin He states he has not yet started dialysis.  Patient with acute on chronic respiratory failure due to hypoxia and CHF. Unknown ejection fraction. No echo in system. Not yet on dialysis. States he still makes urine.  Patient given IV Lasix, IV nitroglycerin. We'll need admission for diuresis. May need to institute dialysis sooner rather than later due to his hypertensive urgency and volume overload. Denies chest pain.  Admission  d/w Dr. Shanon Brow.  CRITICAL CARE Performed by: Ezequiel Essex Total critical care time: 45 minutes Critical care time was exclusive of separately billable procedures and treating other patients. Critical care was necessary to treat or prevent imminent or life-threatening deterioration. Critical care was time spent personally by me on the following activities: development of treatment plan with patient and/or surrogate as well as nursing, discussions with consultants, evaluation of patient's response to treatment, examination of patient, obtaining history from patient or surrogate, ordering and performing treatments and interventions, ordering and review of laboratory studies, ordering and review of radiographic studies, pulse oximetry and re-evaluation of patient's condition.   Final Clinical Impressions(s) / ED Diagnoses   Final diagnoses:  Acute on chronic systolic congestive heart failure (HCC)  Stage 5 chronic kidney disease not on chronic dialysis [facility])    New Prescriptions New Prescriptions   No medications on file    I personally performed the services described in this documentation, which was scribed in my presence. The recorded information has been reviewed and is accurate.    Ezequiel Essex, MD 06/19/16 [phone]

## 2016-06-18 NOTE — ED Triage Notes (Signed)
Pt c/o sob that has become increasingly worse over the past 8 days, denies any pain,

## 2016-06-19 ENCOUNTER — Inpatient Hospital Stay (HOSPITAL_COMMUNITY): Payer: Medicare Other

## 2016-06-19 ENCOUNTER — Encounter (HOSPITAL_COMMUNITY): Payer: Self-pay | Admitting: Emergency Medicine

## 2016-06-19 DIAGNOSIS — J9621 Acute and chronic respiratory failure with hypoxia: Secondary | ICD-10-CM | POA: Diagnosis not present

## 2016-06-19 DIAGNOSIS — J9601 Acute respiratory failure with hypoxia: Secondary | ICD-10-CM

## 2016-06-19 DIAGNOSIS — I5020 Unspecified systolic (congestive) heart failure: Secondary | ICD-10-CM

## 2016-06-19 DIAGNOSIS — I10 Essential (primary) hypertension: Secondary | ICD-10-CM | POA: Diagnosis not present

## 2016-06-19 DIAGNOSIS — Z89512 Acquired absence of left leg below knee: Secondary | ICD-10-CM | POA: Diagnosis not present

## 2016-06-19 DIAGNOSIS — I160 Hypertensive urgency: Secondary | ICD-10-CM | POA: Diagnosis present

## 2016-06-19 DIAGNOSIS — Z992 Dependence on renal dialysis: Secondary | ICD-10-CM | POA: Diagnosis not present

## 2016-06-19 DIAGNOSIS — T82898A Other specified complication of vascular prosthetic devices, implants and grafts, initial encounter: Secondary | ICD-10-CM | POA: Diagnosis not present

## 2016-06-19 DIAGNOSIS — E8770 Fluid overload, unspecified: Secondary | ICD-10-CM

## 2016-06-19 DIAGNOSIS — Z89519 Acquired absence of unspecified leg below knee: Secondary | ICD-10-CM | POA: Diagnosis not present

## 2016-06-19 DIAGNOSIS — D631 Anemia in chronic kidney disease: Secondary | ICD-10-CM | POA: Diagnosis not present

## 2016-06-19 DIAGNOSIS — E1122 Type 2 diabetes mellitus with diabetic chronic kidney disease: Secondary | ICD-10-CM | POA: Diagnosis present

## 2016-06-19 DIAGNOSIS — I132 Hypertensive heart and chronic kidney disease with heart failure and with stage 5 chronic kidney disease, or end stage renal disease: Secondary | ICD-10-CM | POA: Diagnosis not present

## 2016-06-19 DIAGNOSIS — Z87891 Personal history of nicotine dependence: Secondary | ICD-10-CM | POA: Diagnosis not present

## 2016-06-19 DIAGNOSIS — Y832 Surgical operation with anastomosis, bypass or graft as the cause of abnormal reaction of the patient, or of later complication, without mention of misadventure at the time of the procedure: Secondary | ICD-10-CM | POA: Diagnosis not present

## 2016-06-19 DIAGNOSIS — I5043 Acute on chronic combined systolic (congestive) and diastolic (congestive) heart failure: Secondary | ICD-10-CM | POA: Diagnosis not present

## 2016-06-19 DIAGNOSIS — Z8673 Personal history of transient ischemic attack (TIA), and cerebral infarction without residual deficits: Secondary | ICD-10-CM | POA: Diagnosis not present

## 2016-06-19 DIAGNOSIS — N185 Chronic kidney disease, stage 5: Secondary | ICD-10-CM | POA: Diagnosis not present

## 2016-06-19 DIAGNOSIS — R0602 Shortness of breath: Secondary | ICD-10-CM | POA: Insufficient documentation

## 2016-06-19 DIAGNOSIS — Z23 Encounter for immunization: Secondary | ICD-10-CM | POA: Diagnosis not present

## 2016-06-19 DIAGNOSIS — I5023 Acute on chronic systolic (congestive) heart failure: Secondary | ICD-10-CM | POA: Diagnosis not present

## 2016-06-19 DIAGNOSIS — N250 Renal osteodystrophy: Secondary | ICD-10-CM | POA: Diagnosis present

## 2016-06-19 DIAGNOSIS — Z7982 Long term (current) use of aspirin: Secondary | ICD-10-CM | POA: Diagnosis not present

## 2016-06-19 DIAGNOSIS — I509 Heart failure, unspecified: Secondary | ICD-10-CM

## 2016-06-19 DIAGNOSIS — Z89511 Acquired absence of right leg below knee: Secondary | ICD-10-CM | POA: Diagnosis not present

## 2016-06-19 DIAGNOSIS — N179 Acute kidney failure, unspecified: Secondary | ICD-10-CM

## 2016-06-19 DIAGNOSIS — T82858A Stenosis of vascular prosthetic devices, implants and grafts, initial encounter: Secondary | ICD-10-CM | POA: Diagnosis not present

## 2016-06-19 DIAGNOSIS — R9431 Abnormal electrocardiogram [ECG] [EKG]: Secondary | ICD-10-CM

## 2016-06-19 DIAGNOSIS — N281 Cyst of kidney, acquired: Secondary | ICD-10-CM | POA: Diagnosis not present

## 2016-06-19 DIAGNOSIS — Z79899 Other long term (current) drug therapy: Secondary | ICD-10-CM | POA: Diagnosis not present

## 2016-06-19 DIAGNOSIS — Z7902 Long term (current) use of antithrombotics/antiplatelets: Secondary | ICD-10-CM | POA: Diagnosis not present

## 2016-06-19 DIAGNOSIS — N2581 Secondary hyperparathyroidism of renal origin: Secondary | ICD-10-CM | POA: Diagnosis present

## 2016-06-19 DIAGNOSIS — Z9841 Cataract extraction status, right eye: Secondary | ICD-10-CM | POA: Diagnosis not present

## 2016-06-19 DIAGNOSIS — N184 Chronic kidney disease, stage 4 (severe): Secondary | ICD-10-CM | POA: Diagnosis not present

## 2016-06-19 DIAGNOSIS — E1151 Type 2 diabetes mellitus with diabetic peripheral angiopathy without gangrene: Secondary | ICD-10-CM

## 2016-06-19 DIAGNOSIS — N186 End stage renal disease: Secondary | ICD-10-CM | POA: Diagnosis not present

## 2016-06-19 DIAGNOSIS — Z794 Long term (current) use of insulin: Secondary | ICD-10-CM | POA: Diagnosis not present

## 2016-06-19 DIAGNOSIS — I428 Other cardiomyopathies: Secondary | ICD-10-CM | POA: Diagnosis present

## 2016-06-19 DIAGNOSIS — Z961 Presence of intraocular lens: Secondary | ICD-10-CM | POA: Diagnosis present

## 2016-06-19 DIAGNOSIS — Z95810 Presence of automatic (implantable) cardiac defibrillator: Secondary | ICD-10-CM | POA: Diagnosis not present

## 2016-06-19 DIAGNOSIS — E785 Hyperlipidemia, unspecified: Secondary | ICD-10-CM | POA: Diagnosis present

## 2016-06-19 LAB — BLOOD GAS, ARTERIAL
ACID-BASE DEFICIT: 4.4 mmol/L — AB (ref 0.0–2.0)
Bicarbonate: 20.3 mmol/L (ref 20.0–28.0)
DRAWN BY: 105551
FIO2: 28
O2 Saturation: 95.5 %
PH ART: 7.248 — AB (ref 7.350–7.450)
pCO2 arterial: 51.6 mmHg — ABNORMAL HIGH (ref 32.0–48.0)
pO2, Arterial: 93.2 mmHg (ref 83.0–108.0)

## 2016-06-19 LAB — CBC
HCT: 25.4 % — ABNORMAL LOW (ref 39.0–52.0)
HEMOGLOBIN: 8.2 g/dL — AB (ref 13.0–17.0)
MCH: 30.3 pg (ref 26.0–34.0)
MCHC: 32.3 g/dL (ref 30.0–36.0)
MCV: 93.7 fL (ref 78.0–100.0)
Platelets: 191 10*3/uL (ref 150–400)
RBC: 2.71 MIL/uL — ABNORMAL LOW (ref 4.22–5.81)
RDW: 13.9 % (ref 11.5–15.5)
WBC: 8.1 10*3/uL (ref 4.0–10.5)

## 2016-06-19 LAB — GLUCOSE, CAPILLARY
GLUCOSE-CAPILLARY: 159 mg/dL — AB (ref 65–99)
GLUCOSE-CAPILLARY: 147 mg/dL — AB (ref 65–99)
Glucose-Capillary: 167 mg/dL — ABNORMAL HIGH (ref 65–99)
Glucose-Capillary: 143 mg/dL — ABNORMAL HIGH (ref 65–99)

## 2016-06-19 LAB — ECHOCARDIOGRAM COMPLETE
HEIGHTINCHES: 72 in
WEIGHTICAEL: 4254 [oz_av]

## 2016-06-19 LAB — BASIC METABOLIC PANEL
Anion gap: 10 (ref 5–15)
BUN: 92 mg/dL — AB (ref 6–20)
CALCIUM: 8.2 mg/dL — AB (ref 8.9–10.3)
CO2: 23 mmol/L (ref 22–32)
CREATININE: 5.98 mg/dL — AB (ref 0.61–1.24)
Chloride: 108 mmol/L (ref 101–111)
GFR calc Af Amer: 10 mL/min — ABNORMAL LOW (ref >60)
GFR calc non Af Amer: 9 mL/min — ABNORMAL LOW (ref >60)
GLUCOSE: 144 mg/dL — AB (ref 65–99)
Potassium: 4.8 mmol/L (ref 3.5–5.1)
Sodium: 141 mmol/L (ref 135–145)

## 2016-06-19 LAB — BRAIN NATRIURETIC PEPTIDE: B Natriuretic Peptide: 1237 pg/mL — ABNORMAL HIGH (ref 0.0–100.0)

## 2016-06-19 LAB — TROPONIN I
TROPONIN I: 0.04 ng/mL — AB (ref <0.03)
TROPONIN I: 0.04 ng/mL — AB (ref <0.03)
Troponin I: 0.03 ng/mL (ref <0.03)
Troponin I: 0.04 ng/mL (ref <0.03)

## 2016-06-19 LAB — PROTIME-INR
INR: 1.3
Prothrombin Time: 16.3 seconds — ABNORMAL HIGH (ref 11.4–15.2)

## 2016-06-19 LAB — CREATININE, SERUM
Creatinine, Ser: 6.02 mg/dL — ABNORMAL HIGH (ref 0.61–1.24)
GFR calc Af Amer: 10 mL/min — ABNORMAL LOW (ref >60)
GFR, EST NON AFRICAN AMERICAN: 9 mL/min — AB (ref >60)

## 2016-06-19 LAB — MRSA PCR SCREENING: MRSA BY PCR: NEGATIVE

## 2016-06-19 MED ORDER — FUROSEMIDE 10 MG/ML IJ SOLN
80.0000 mg | Freq: Two times a day (BID) | INTRAMUSCULAR | Status: DC
  Administered 2016-06-19 – 2016-06-22 (×7): 80 mg via INTRAVENOUS
  Filled 2016-06-19 (×7): qty 8

## 2016-06-19 MED ORDER — ONDANSETRON HCL 4 MG/2ML IJ SOLN: 4.0000 mg | Freq: Four times a day (QID) | INTRAMUSCULAR | Status: DC | PRN

## 2016-06-19 MED ORDER — SPIRONOLACTONE 25 MG PO TABS
25.0000 mg | ORAL_TABLET | Freq: Two times a day (BID) | ORAL | Status: DC
  Administered 2016-06-19 – 2016-06-23 (×9): 25 mg via ORAL
  Filled 2016-06-19 (×9): qty 1

## 2016-06-19 MED ORDER — HEPARIN SODIUM (PORCINE) 5000 UNIT/ML IJ SOLN
5000.0000 [IU] | Freq: Three times a day (TID) | INTRAMUSCULAR | Status: DC
  Administered 2016-06-19 – 2016-06-29 (×26): 5000 [IU] via SUBCUTANEOUS
  Filled 2016-06-19 (×27): qty 1

## 2016-06-19 MED ORDER — PERFLUTREN LIPID MICROSPHERE
1.0000 mL | INTRAVENOUS | Status: AC | PRN
  Administered 2016-06-19: 1 mL via INTRAVENOUS
  Administered 2016-06-19: 2 mL via INTRAVENOUS
  Filled 2016-06-19: qty 10

## 2016-06-19 MED ORDER — NITROGLYCERIN IN D5W 200-5 MCG/ML-% IV SOLN: 0.0000 ug/min | INTRAVENOUS | Status: DC

## 2016-06-19 MED ORDER — FUROSEMIDE 10 MG/ML IJ SOLN
40.0000 mg | Freq: Once | INTRAMUSCULAR | Status: AC
  Administered 2016-06-19: 40 mg via INTRAVENOUS
  Filled 2016-06-19: qty 4

## 2016-06-19 MED ORDER — NITROGLYCERIN 2 % TD OINT
1.0000 [in_us] | TOPICAL_OINTMENT | Freq: Once | TRANSDERMAL | Status: AC
  Administered 2016-06-19: 1 [in_us] via TOPICAL
  Filled 2016-06-19: qty 1

## 2016-06-19 MED ORDER — AMLODIPINE BESYLATE 10 MG PO TABS
10.0000 mg | ORAL_TABLET | Freq: Every day | ORAL | Status: DC
  Administered 2016-06-19 – 2016-06-24 (×5): 10 mg via ORAL
  Filled 2016-06-19 (×2): qty 1
  Filled 2016-06-19 (×3): qty 2

## 2016-06-19 MED ORDER — NITROGLYCERIN IN D5W 200-5 MCG/ML-% IV SOLN
5.0000 ug/min | Freq: Once | INTRAVENOUS | Status: AC
  Administered 2016-06-19: 5 ug/min via INTRAVENOUS
  Filled 2016-06-19: qty 250

## 2016-06-19 MED ORDER — INSULIN ASPART 100 UNIT/ML ~~LOC~~ SOLN
0.0000 [IU] | Freq: Three times a day (TID) | SUBCUTANEOUS | Status: DC
  Administered 2016-06-19: 2 [IU] via SUBCUTANEOUS
  Administered 2016-06-19: 1 [IU] via SUBCUTANEOUS
  Administered 2016-06-19 – 2016-06-21 (×4): 2 [IU] via SUBCUTANEOUS
  Administered 2016-06-22: 1 [IU] via SUBCUTANEOUS
  Administered 2016-06-22: 3 [IU] via SUBCUTANEOUS
  Administered 2016-06-23: 1 [IU] via SUBCUTANEOUS
  Administered 2016-06-23: 2 [IU] via SUBCUTANEOUS
  Administered 2016-06-24: 1 [IU] via SUBCUTANEOUS
  Administered 2016-06-24 – 2016-06-25 (×3): 2 [IU] via SUBCUTANEOUS
  Administered 2016-06-26: 1 [IU] via SUBCUTANEOUS
  Administered 2016-06-26: 2 [IU] via SUBCUTANEOUS
  Administered 2016-06-26: 3 [IU] via SUBCUTANEOUS
  Administered 2016-06-27 – 2016-06-28 (×2): 2 [IU] via SUBCUTANEOUS
  Administered 2016-06-28: 3 [IU] via SUBCUTANEOUS
  Administered 2016-06-28: 2 [IU] via SUBCUTANEOUS

## 2016-06-19 MED ORDER — CARVEDILOL 25 MG PO TABS
25.0000 mg | ORAL_TABLET | Freq: Two times a day (BID) | ORAL | Status: DC
  Administered 2016-06-19 – 2016-06-29 (×20): 25 mg via ORAL
  Filled 2016-06-19: qty 2
  Filled 2016-06-19: qty 1
  Filled 2016-06-19: qty 2
  Filled 2016-06-19 (×3): qty 1
  Filled 2016-06-19 (×3): qty 2
  Filled 2016-06-19 (×6): qty 1
  Filled 2016-06-19: qty 2
  Filled 2016-06-19 (×3): qty 1
  Filled 2016-06-19: qty 2

## 2016-06-19 MED ORDER — SODIUM CHLORIDE 0.9 % IV SOLN
250.0000 mL | INTRAVENOUS | Status: DC | PRN
  Administered 2016-06-19: 250 mL via INTRAVENOUS

## 2016-06-19 MED ORDER — ASPIRIN EC 81 MG PO TBEC
81.0000 mg | DELAYED_RELEASE_TABLET | Freq: Every day | ORAL | Status: DC
  Administered 2016-06-19 – 2016-06-29 (×11): 81 mg via ORAL
  Filled 2016-06-19 (×11): qty 1

## 2016-06-19 MED ORDER — CLONIDINE HCL 0.1 MG PO TABS
0.1000 mg | ORAL_TABLET | Freq: Three times a day (TID) | ORAL | Status: DC
  Administered 2016-06-19 – 2016-06-24 (×16): 0.1 mg via ORAL
  Filled 2016-06-19 (×16): qty 1

## 2016-06-19 MED ORDER — CLOPIDOGREL BISULFATE 75 MG PO TABS
75.0000 mg | ORAL_TABLET | Freq: Every day | ORAL | Status: DC
  Administered 2016-06-19 – 2016-06-29 (×11): 75 mg via ORAL
  Filled 2016-06-19 (×11): qty 1

## 2016-06-19 MED ORDER — SODIUM CHLORIDE 0.9% FLUSH
3.0000 mL | INTRAVENOUS | Status: DC | PRN
  Administered 2016-06-19: 3 mL via INTRAVENOUS
  Filled 2016-06-19: qty 3

## 2016-06-19 MED ORDER — PNEUMOCOCCAL VAC POLYVALENT 25 MCG/0.5ML IJ INJ
0.5000 mL | INJECTION | INTRAMUSCULAR | Status: AC
Start: 2016-06-20 — End: 2016-06-20
  Administered 2016-06-20: 0.5 mL via INTRAMUSCULAR
  Filled 2016-06-19: qty 0.5

## 2016-06-19 MED ORDER — SODIUM CHLORIDE 0.9% FLUSH
3.0000 mL | Freq: Two times a day (BID) | INTRAVENOUS | Status: DC
  Administered 2016-06-19 – 2016-06-28 (×19): 3 mL via INTRAVENOUS

## 2016-06-19 MED ORDER — ACETAMINOPHEN 325 MG PO TABS: 650.0000 mg | ORAL_TABLET | ORAL | Status: DC | PRN

## 2016-06-19 NOTE — ED Notes (Signed)
Critical lab value Trop 0.03 Dr aware.

## 2016-06-19 NOTE — H&P (Signed)
History and Physical    [name] XIH:[id number] DOB: 05/13/1955 DOA: 06/18/2016  PCP: Lanette Hampshire, MD (Inactive)  Patient coming from:  home  Chief Complaint:   Sob, swelling  HPI: [name] is a 61 y.o. male with medical history significant of CKD, AICD, CHF, bilateral BKA, DM, TIA is s/p AV fistula placed over a year ago in preparation for the need for dialysis comes in with gross swelling for several days.  Pt reports he saw dr Birdie Sons last month.  He was not swollen at that time, at dr befakado held his spirinolactone for unknown reason to him.  He reports he is compliant with his meds.  He denies any fevers.  He has been much more sob, and both of his stumps are swollen up to his thighs along with his pannus.  He does not know how much weight he has gained.  He says dr Birdie Sons told him he didn't need dialysis yet and would see him in another month, but they have been preparing that at some point he would need it.  He does not require supplemental oxygen at home chronically.  He comes in tonight because he has been much more sob, and the swelling.  Found to be in gross volume overload, pt mentation is normal.  Referred for admission for aggressive diiuresis and hypoxia.  His oxygen sats mid 80s on RA.   Review of Systems: As per HPI otherwise 10 point review of systems negative.   Past Medical History:  Diagnosis Date  . AICD (automatic cardioverter/defibrillator) present   . Amputated below knee (HCC)    Right  . Anemia   . Arthritis   . Cerebrovascular disease   . CHF (congestive heart failure) ([place])   . Chronic kidney disease    Stage IV  . Diabetes mellitus    type II  . History of TIAs   . Hyperlipidemia   . Hypertension   . Nonischemic cardiomyopathy ([place])   . PVD (peripheral vascular disease) ([place])   . Shortness of breath dyspnea    with exertion   . Skin disease    Rare  . Stroke [facility])    "light stroke"  . Tobacco abuse     Past Surgical History:    Procedure Laterality Date  . AV FISTULA PLACEMENT Left 04/08/2015   Procedure: Creation of Left arm BRACHIOCEPHALIC ARTERIOVENOUS  FISTULA ;  Surgeon: Mal Misty, MD;  Location: [place];  Service: Vascular;  Laterality: Left;  . CARDIAC DEFIBRILLATOR PLACEMENT    . CARDIAC DEFIBRILLATOR PLACEMENT    . CATARACT EXTRACTION W/PHACO Right 03/17/2015   Procedure: CATARACT EXTRACTION PHACO AND INTRAOCULAR LENS PLACEMENT (IOC);  Surgeon: Rutherford Guys, MD;  Location: [facility];  Service: Ophthalmology;  Laterality: Right;  CDE: 6.59  . EYE SURGERY Right    Cataract  . LEG AMPUTATION BELOW KNEE     bilateral     reports that he quit smoking about 18 years ago. He has a 20.00 pack-year smoking history. He has never used smokeless tobacco. He reports that he does not drink alcohol or use drugs.  Allergies  Allergen Reactions  . Acetazolamide Other (See Comments)    Jittery odd feeling. (hyper feeling)  . Contrast Media [Iodinated Diagnostic Agents] Other (See Comments)    unknown  . Other Other (See Comments)    Transfer Dye---"Makes me tired"    Family History  Problem Relation Age of Onset  . Diabetes Father   .  Stroke Father   . Diabetes Brother   . Diabetes Brother   . Diabetes Brother   . Diabetes Brother   . Heart failure Mother   . Diabetes Mother   . Diabetes Other   . Coronary artery disease Other     Prior to Admission medications   Medication Sig Start Date End Date Taking? Authorizing Provider  acetaminophen (TYLENOL) 500 MG tablet Take 1,000 mg by mouth daily as needed for mild pain or headache.     Historical Provider, MD  amLODipine (NORVASC) 10 MG tablet Take 10 mg by mouth daily.      Historical Provider, MD  aspirin 81 MG tablet Take 81 mg by mouth daily.      Historical Provider, MD  brimonidine-timolol (COMBIGAN) 0.2-0.5 % ophthalmic solution Place 1 drop into the right eye every 12 (twelve) hours.    Historical Provider, MD  calcium acetate (PHOSLO) 667 MG  capsule Take 667 mg by mouth daily. With largest meal of the day 03/25/15   Historical Provider, MD  carvedilol (COREG) 25 MG tablet Take 1 tablet (25 mg total) by mouth 2 (two) times daily. 03/02/11   Evans Lance, MD  cloNIDine (CATAPRES) 0.1 MG tablet take 1 tablet by mouth twice a day Patient taking differently: take 1 tablet by mouth three times a day 06/13/11   Evans Lance, MD  clopidogrel (PLAVIX) 75 MG tablet Take 75 mg by mouth daily.      Historical Provider, MD  fenofibrate 160 MG tablet Take 160 mg by mouth daily. 03/09/15   Historical Provider, MD  folic acid (FOLVITE) 1 MG tablet Take 1 mg by mouth daily.    Historical Provider, MD  furosemide (LASIX) 20 MG tablet Take 80 mg by mouth 2 (two) times daily.     Historical Provider, MD  HUMALOG KWIKPEN 100 UNIT/ML KiwkPen Inject 10 Units into the skin 3 (three) times daily.  04/01/15   Historical Provider, MD  Insulin Glargine (LANTUS SOLOSTAR [place]) Inject 50 Units into the skin at bedtime.     Historical Provider, MD  ofloxacin (OCUFLOX) 0.3 % ophthalmic solution Place 1 drop into the right eye 2 (two) times daily.    Historical Provider, MD  oxyCODONE (OXY IR/ROXICODONE) 5 MG immediate release tablet Take 1 tablet (5 mg total) by mouth at bedtime as needed for moderate pain. 04/08/15   Ulyses Amor, PA-C  prednisoLONE acetate (PRED FORTE) 1 % ophthalmic suspension Place 1 drop into the right eye every 2 (two) hours while awake.    Historical Provider, MD  rosuvastatin (CRESTOR) 20 MG tablet Take 20 mg by mouth daily. 02/24/15   Historical Provider, MD  spironolactone (ALDACTONE) 25 MG tablet Take 25 mg by mouth 2 (two) times daily.    Historical Provider, MD  traMADol (ULTRAM) 50 MG tablet Take 1 tablet (50 mg total) by mouth every 6 (six) hours as needed. 05/17/15   Dorie Rank, MD  Vitamin D, Ergocalciferol, (DRISDOL) 50000 UNITS CAPS capsule Take 50,000 Units by mouth once a week. 02/12/15   Historical Provider, MD    Physical  Exam: Vitals:   06/19/16 0248 06/19/16 0304 06/19/16 0307 06/19/16 0313  BP: (!) 164/78 (!) 153/86 (!) 172/83 (!) 159/82  Pulse: 65 66 68 65  Resp: 15     Temp: 97.5 F (36.4 C)     TempSrc: Oral     SpO2: 96%     Weight:      Height:  Constitutional: NAD, calm, comfortable Vitals:   06/19/16 0248 06/19/16 0304 06/19/16 0307 06/19/16 0313  BP: (!) 164/78 (!) 153/86 (!) 172/83 (!) 159/82  Pulse: 65 66 68 65  Resp: 15     Temp: 97.5 F (36.4 C)     TempSrc: Oral     SpO2: 96%     Weight:      Height:       Eyes: PERRL, lids and conjunctivae normal ENMT: Mucous membranes are moist. Posterior pharynx clear of any exudate or lesions.Normal dentition.  Neck: normal, supple, no masses, no thyromegaly Respiratory: clear to auscultation bilaterally, no wheezing, no crackles. Normal respiratory effort. No accessory muscle use.  Cardiovascular: Regular rate and rhythm, no murmurs / rubs / gallops. 3-4 + bilateral stump and thigh extremity edema. 2+ pedal pulses. No carotid bruits.  Abdomen: no tenderness, no masses palpated. No hepatosplenomegaly. Bowel sounds positive.  Pannus edema. Musculoskeletal: no clubbing / cyanosis. Bilateral BKA. Good ROM, no contractures. Normal muscle tone.  Skin: no rashes, lesions, ulcers. No induration Neurologic: CN 2-12 grossly intact. Sensation intact, DTR normal. Strength 5/5 in all 4.  Psychiatric: Normal judgment and insight. Alert and oriented x 3. Normal mood.    Labs on Admission: I have personally reviewed following labs and imaging studies  CBC:  Recent Labs Lab 06/18/16 2310  WBC 12.6*  NEUTROABS 11.0*  HGB 9.0*  HCT 28.6*  MCV 93.8  PLT 224   Basic Metabolic Panel:  Recent Labs Lab 06/18/16 2310  NA 141  K 4.8  CL 108  CO2 23  GLUCOSE 144*  BUN 92*  CREATININE 5.98*  CALCIUM 8.2*   GFR: Estimated Creatinine Clearance: 16.4 mL/min (A) (by C-G formula based on SCr of 5.98 mg/dL (H)).  Coagulation  Profile:  Recent Labs Lab 06/18/16 2310  INR 1.30   Cardiac Enzymes:  Recent Labs Lab 06/18/16 2310  TROPONINI 0.03*   Radiological Exams on Admission: Dg Chest Port 1 View  Result Date: 06/18/2016 CLINICAL DATA:  Shortness of breath increasingly worse over the past 8 days. History of CHF, diabetes, hypertension, stroke, TIA, PVD. EXAM: PORTABLE CHEST 1 VIEW COMPARISON:  09/27/2010 FINDINGS: Cardiac pacemaker. Shallow inspiration. Cardiac enlargement with diffuse pulmonary vascular congestion. Hazy perihilar opacities likely representing early edema. Probable small right pleural effusion. No focal volume loss or consolidation. No pneumothorax. Calcification of the aorta. IMPRESSION: Likely congestive heart failure with cardiac enlargement, pulmonary vascular congestion, perihilar edema, and small right pleural effusion. Electronically Signed   By: Lucienne Capers M.D.   On: 06/18/2016 23:30    EKG: Independently reviewed. nsr no acute issues Old chart reviewed Case discussed with EDP cxr reviewed edema, CM, pleural eff on right  Assessment/Plan 61 yo male with worsening renal failure, acute hypoxic resp failure and gross volume overload  Principal Problem:   CHF exacerbation ([place])- no recent echo, per his chart he has h/o chf but unknown EF.  Check cardiac echo to r/o cardiac component, may be more due to progressive kidney disease.  Cont ntg drip.  Active Problems:   Acute on chronic respiratory failure with hypoxia ([place])- again has worsening renal failure, pt reports good uop.  Provide supplemental oxygen.  diurese   Essential hypertension- on ntg drip   Peripheral vascular disease ([place])- noted   Chronic kidney disease (CKD), stage IV (severe) ([place])- aggressive diuresis, obtain nephrology consult, if he does not respond to high dose lasix he may need institution of dialysis sooner rather than later  DVT prophylaxis:  scds  Code Status:  full Family Communication:   none Disposition Plan:  Per day team Consults called:  nephro requested in EPIC Admission status:  admission   Sharnette Kitamura A MD [facility]  If 7PM-7AM, please contact night-coverage [email or web address] Password [email or web address]  06/19/2016, 3:15 AM

## 2016-06-19 NOTE — Consult Note (Addendum)
Reason for Consult: Chronic renal failure and fluid overload Referring Physician: Dr. Harless Nakayama is an 61 y.o. male.  HPI: [name] with history of diabetes, hypertension, chronic renal failure stage IV presently came with complaints of increased leg swelling, difficulty breathing for the last couple of days. Has some cough with no sputum production. Presently [name] denies also any chest pain. When [name] was evaluated and [name] was found to have signs of fluid overload hence admitted to the hospital. Presently [name] states that she's feeling much better. [name] has been taking Lasix as outpatient without significant improvement. Presently [name] denies any nausea or vomiting. His appetite is good.  Past Medical History:  Diagnosis Date  . AICD (automatic cardioverter/defibrillator) present   . Amputated below knee (HCC)    Right  . Anemia   . Arthritis   . Cerebrovascular disease   . CHF (congestive heart failure) ([place])   . Chronic kidney disease    Stage IV  . Diabetes mellitus    type II  . History of TIAs   . Hyperlipidemia   . Hypertension   . Nonischemic cardiomyopathy ([place])   . PVD (peripheral vascular disease) ([place])   . Shortness of breath dyspnea    with exertion   . Skin disease    Rare  . Stroke [facility])    "light stroke"  . Tobacco abuse     Past Surgical History:  Procedure Laterality Date  . AV FISTULA PLACEMENT Left 04/08/2015   Procedure: Creation of Left arm BRACHIOCEPHALIC ARTERIOVENOUS  FISTULA ;  Surgeon: Mal Misty, MD;  Location: [place];  Service: Vascular;  Laterality: Left;  . CARDIAC DEFIBRILLATOR PLACEMENT    . CARDIAC DEFIBRILLATOR PLACEMENT    . CATARACT EXTRACTION W/PHACO Right 03/17/2015   Procedure: CATARACT EXTRACTION PHACO AND INTRAOCULAR LENS PLACEMENT (IOC);  Surgeon: Rutherford Guys, MD;  Location: [facility];  Service: Ophthalmology;  Laterality: Right;  CDE: 6.59  . EYE SURGERY Right    Cataract  . LEG AMPUTATION BELOW KNEE      bilateral    Family History  Problem Relation Age of Onset  . Diabetes Father   . Stroke Father   . Diabetes Brother   . Diabetes Brother   . Diabetes Brother   . Diabetes Brother   . Heart failure Mother   . Diabetes Mother   . Diabetes Other   . Coronary artery disease Other     Social History:  reports that [name] quit smoking about 18 years ago. [name] has a 20.00 pack-year smoking history. [name] has never used smokeless tobacco. [name] reports that [name] does not drink alcohol or use drugs.  Allergies:  Allergies  Allergen Reactions  . Acetazolamide Other (See Comments)    Jittery odd feeling. (hyper feeling)  . Contrast Media [Iodinated Diagnostic Agents] Other (See Comments)    unknown  . Other Other (See Comments)    Transfer Dye---"Makes me tired"    Medications: I have reviewed the [name]'s current medications.  Results for orders placed or performed during the hospital encounter of 06/18/16 (from the past 48 hour(s))  CBC with Differential     Status: Abnormal   Collection Time: 06/18/16 11:10 PM  Result Value Ref Range   WBC 12.6 (H) 4.0 - 10.5 K/uL   RBC 3.05 (L) 4.22 - 5.81 MIL/uL   Hemoglobin 9.0 (L) 13.0 - 17.0 g/dL   HCT 28.6 (L) 39.0 - 52.0 %   MCV 93.8  78.0 - 100.0 fL   MCH 29.5 26.0 - 34.0 pg   MCHC 31.5 30.0 - 36.0 g/dL   RDW 14.1 11.5 - 15.5 %   Platelets 228 150 - 400 K/uL   Neutrophils Relative % 87 %   Neutro Abs 11.0 (H) 1.7 - 7.7 K/uL   Lymphocytes Relative 7 %   Lymphs Abs 0.9 0.7 - 4.0 K/uL   Monocytes Relative 4 %   Monocytes Absolute 0.5 0.1 - 1.0 K/uL   Eosinophils Relative 2 %   Eosinophils Absolute 0.2 0.0 - 0.7 K/uL   Basophils Relative 0 %   Basophils Absolute 0.0 0.0 - 0.1 K/uL  Basic metabolic panel     Status: Abnormal   Collection Time: 06/18/16 11:10 PM  Result Value Ref Range   Sodium 141 135 - 145 mmol/L   Potassium 4.8 3.5 - 5.1 mmol/L   Chloride 108 101 - 111 mmol/L   CO2 23 22 - 32 mmol/L   Glucose, Bld 144 (H) 65 - 99 mg/dL    BUN 92 (H) 6 - 20 mg/dL    Comment: RESULTS CONFIRMED BY MANUAL DILUTION   Creatinine, Ser 5.98 (H) 0.61 - 1.24 mg/dL   Calcium 8.2 (L) 8.9 - 10.3 mg/dL   GFR calc non Af Amer 9 (L) >60 mL/min   GFR calc Af Amer 10 (L) >60 mL/min    Comment: (NOTE) The eGFR has been calculated using the CKD EPI equation. This calculation has not been validated in all clinical situations. eGFR's persistently <60 mL/min signify possible Chronic Kidney Disease.    Anion gap 10 5 - 15  Troponin I     Status: Abnormal   Collection Time: 06/18/16 11:10 PM  Result Value Ref Range   Troponin I 0.03 (HH) <0.03 ng/mL    Comment: CRITICAL RESULT CALLED TO, READ BACK BY AND VERIFIED WITH:  WILLIAM,C @ 0023 ON 06/19/16 BY JUW   Brain natriuretic peptide     Status: Abnormal   Collection Time: 06/18/16 11:10 PM  Result Value Ref Range   B Natriuretic Peptide 1,237.0 (H) 0.0 - 100.0 pg/mL  Protime-INR     Status: Abnormal   Collection Time: 06/18/16 11:10 PM  Result Value Ref Range   Prothrombin Time 16.3 (H) 11.4 - 15.2 seconds   INR 1.30   Blood gas, arterial ([facility] & [facility] ONLY)     Status: Abnormal   Collection Time: 06/19/16  1:10 AM  Result Value Ref Range   FIO2 28.00    Delivery systems NASAL CANNULA    pH, Arterial 7.248 (L) 7.350 - 7.450   pCO2 arterial 51.6 (H) 32.0 - 48.0 mmHg   pO2, Arterial 93.2 83.0 - 108.0 mmHg   Bicarbonate 20.3 20.0 - 28.0 mmol/L   Acid-base deficit 4.4 (H) 0.0 - 2.0 mmol/L   O2 Saturation 95.5 %   Collection site BRACHIAL ARTERY    Drawn by [id number]    Sample type ARTERIAL    Allens test (pass/fail) PASS PASS  MRSA PCR Screening     Status: None   Collection Time: 06/19/16  4:31 AM  Result Value Ref Range   MRSA by PCR NEGATIVE NEGATIVE    Comment:        The GeneXpert MRSA Assay (FDA approved for NASAL specimens only), is one component of a comprehensive MRSA colonization surveillance program. It is not intended to diagnose MRSA infection nor to guide or monitor  treatment for MRSA infections.   Troponin I  Status: Abnormal   Collection Time: 06/19/16  5:45 AM  Result Value Ref Range   Troponin I 0.04 (HH) <0.03 ng/mL    Comment: CRITICAL VALUE NOTED.  VALUE IS CONSISTENT WITH PREVIOUSLY REPORTED AND CALLED VALUE.  Glucose, capillary     Status: Abnormal   Collection Time: 06/19/16  7:53 AM  Result Value Ref Range   Glucose-Capillary 167 (H) 65 - 99 mg/dL    Dg Chest Port 1 View  Result Date: 06/18/2016 CLINICAL DATA:  Shortness of breath increasingly worse over the past 8 days. History of CHF, diabetes, hypertension, stroke, TIA, PVD. EXAM: PORTABLE CHEST 1 VIEW COMPARISON:  09/27/2010 FINDINGS: Cardiac pacemaker. Shallow inspiration. Cardiac enlargement with diffuse pulmonary vascular congestion. Hazy perihilar opacities likely representing early edema. Probable small right pleural effusion. No focal volume loss or consolidation. No pneumothorax. Calcification of the aorta. IMPRESSION: Likely congestive heart failure with cardiac enlargement, pulmonary vascular congestion, perihilar edema, and small right pleural effusion. Electronically Signed   By: Lucienne Capers M.D.   On: 06/18/2016 23:30    Review of Systems  Constitutional: Negative for chills and fever.  Respiratory: Positive for shortness of breath. Negative for cough and hemoptysis.   Cardiovascular: Positive for orthopnea and leg swelling. Negative for chest pain.  Gastrointestinal: Negative for abdominal pain, nausea and vomiting.   Blood pressure (!) 141/58, pulse 73, temperature 98.1 F (36.7 C), temperature source Oral, resp. rate 16, height 6' (1.829 m), weight 120.6 kg (265 lb 14 oz), SpO2 96 %. Physical Exam  Constitutional: [name] is oriented to person, place, and time. No distress.  Eyes: Left eye exhibits no discharge.  Neck: No JVD present.  Cardiovascular: Normal rate and regular rhythm.   Respiratory: No respiratory distress. [name] has no wheezes. [name] has rales.  GI: [name]  exhibits no distension. There is no tenderness. There is no rebound.  Musculoskeletal: [name] exhibits edema.  Neurological: [name] is alert and oriented to person, place, and time.    Assessment/Plan: Problem #1 fluid overload: This is because of uncontrolled salt and fluid intake. Doesn't use on Lasix. [name] is nonoliguric and is feeling much better. Problem #2 chronic renal failure late stage IV. Presently is pending creatinine has increased. This could be from natural progression of the disease however prerenal syndrome/ ATN cannot be ruled out. At this moment [name] doesn't have any uremic signs and symptoms. His potassium is normal. [name] with left upper arm fistula which has matured. Problem #3 history of diabetes: His blood sugar is somewhat better. Problem #4 hypertension: His blood pressure is reasonably controlled Problem #5 anemia Problem #6 history of nonischemic cardiomyopathy Problem #7 history of CVA Problem #8 peripheral vascular disease: Status post BKA. Plan: 1] [name] at this moment doesn't require dialysis. 2] We'll continue with IV Lasix 3] We'll check iron studies and renal panel in the morning.  Theda Payer S 06/19/2016, 9:10 AM

## 2016-06-19 NOTE — Progress Notes (Addendum)
PROGRESS NOTE  [name] PJK:[id number] DOB: [birth year] DOA: 06/18/2016 PCP: Lanette Hampshire, MD (Inactive)  Brief Narrative: 61 year old man PMH diabetes mellitus, bilateral BKA, AICD, CHF, C KD with AV fistula placed but not on dialysis, presented with increasing swelling, weight gain and shortness of breath. Admitted for hypertensive urgency, CHF exacerbation, volume overload, acute hypoxic respiratory failure.  Assessment/Plan 1. Acute hypoxic respiratory failure secondary to volume overload  Appears to be much improved at this point. Continue management of heart failure and volume overload. Wean oxygen as tolerated.  2. Volume overload secondary to CHF, versus progression of chronic kidney disease.  Follow-up echocardiogram results. EKG nonacute. Troponins flat. No evidence of ACS.  Diuresis. 3. Acute kidney injury superimposed on chronic kidney disease stage IV.  Management per nephrology. 4. Reported nonischemic cardiomyopathy, no echocardiogram on file.   Follow-up echocardiogram. 5. Hypertensive urgency, resolved.   Wean off nitroglycerin infusion.  6. Diabetes mellitus with associated peripheral vascular disease, status post bilateral BKA.  Blood sugars stable.   DVT prophylaxis: heparin Code Status: full code Family Communication: none Disposition Plan: home    Murray Hodgkins, MD  [facility] Direct contact: [phone] --[place] [place]  --[email or web address]; password [email or web address]  7PM-7AM contact night coverage as above 06/19/2016, 10:20 AM  LOS: 0 days   Consultants:  Nephrology   Procedures:    Antimicrobials:    Interval history/Subjective: Feels much better, breathing much better, no chest pain.  Objective: Vitals:   06/19/16 0408 06/19/16 0500 06/19/16 0800 06/19/16 0939  BP: (!) 148/73  (!) 141/58 (!) 165/65  Pulse: 80  73   Resp:   16   Temp:  97 F (36.1 C) 98.1 F (36.7 C)   TempSrc:  Oral Oral   SpO2:   96%   Weight:  120.6  kg (265 lb 14 oz)    Height:  6' (1.829 m)      Intake/Output Summary (Last 24 hours) at 06/19/16 1020 Last data filed at 06/19/16 0800  Gross per 24 hour  Intake            87.68 ml  Output               50 ml  Net            37.68 ml     Filed Weights   06/18/16 2213 06/19/16 0500  Weight: 113.4 kg (250 lb) 120.6 kg (265 lb 14 oz)    Exam:    Constitutional: appears calm, comfortable  Resp: CTA bilaterally, no w/r/r.  CV: RRR no m/r/r. 2+ edema bilateral BKA. Abdomen soft Skin appears grossly unremarkable Psychiatric grossly normal mood and affect. Speech fluent and appropriate.   I have personally reviewed following labs and imaging studies:  Troponin flat, 0.04  ABG 7.24/51/93 on admission  BUN 92, creatinine 5.8 on admission  BNP 1237 on admission  Scheduled Meds: . amLODipine  10 mg Oral Daily  . aspirin EC  81 mg Oral Daily  . carvedilol  25 mg Oral BID  . cloNIDine  0.1 mg Oral TID  . clopidogrel  75 mg Oral Daily  . furosemide  80 mg Intravenous Q12H  . heparin  5,000 Units Subcutaneous Q8H  . insulin aspart  0-9 Units Subcutaneous TID WC  . [START ON 06/20/2016] pneumococcal 23 valent vaccine  0.5 mL Intramuscular Tomorrow-1000  . sodium chloride flush  3 mL Intravenous Q12H  . spironolactone  25 mg Oral BID   Continuous Infusions: .  nitroGLYCERIN 60 mcg/min (06/19/16 1017)    Principal Problem:   CHF exacerbation (HCC) Active Problems:   Essential hypertension   Peripheral vascular disease (HCC)   Chronic kidney disease (CKD), stage IV (severe) (HCC)   Acute hypoxemic respiratory failure (HCC)   Volume overload   AKI (acute kidney injury) ([place])   DM (diabetes mellitus), type 2 with peripheral vascular complications ([place])   LOS: 0 days

## 2016-06-19 NOTE — Progress Notes (Signed)
*  PRELIMINARY RESULTS* Echocardiogram 2D Echocardiogram has been performed with Definity.  Samuel Germany 06/19/2016, 10:31 AM

## 2016-06-20 ENCOUNTER — Inpatient Hospital Stay (HOSPITAL_COMMUNITY): Payer: Medicare Other

## 2016-06-20 DIAGNOSIS — I5042 Chronic combined systolic (congestive) and diastolic (congestive) heart failure: Secondary | ICD-10-CM

## 2016-06-20 DIAGNOSIS — E8770 Fluid overload, unspecified: Secondary | ICD-10-CM

## 2016-06-20 DIAGNOSIS — I5043 Acute on chronic combined systolic (congestive) and diastolic (congestive) heart failure: Secondary | ICD-10-CM

## 2016-06-20 LAB — CBC
HEMATOCRIT: 26.8 % — AB (ref 39.0–52.0)
Hemoglobin: 8.4 g/dL — ABNORMAL LOW (ref 13.0–17.0)
MCH: 29.5 pg (ref 26.0–34.0)
MCHC: 31.3 g/dL (ref 30.0–36.0)
MCV: 94 fL (ref 78.0–100.0)
PLATELETS: 183 10*3/uL (ref 150–400)
RBC: 2.85 MIL/uL — ABNORMAL LOW (ref 4.22–5.81)
RDW: 13.9 % (ref 11.5–15.5)
WBC: 8.1 10*3/uL (ref 4.0–10.5)

## 2016-06-20 LAB — RENAL FUNCTION PANEL
Albumin: 3.5 g/dL (ref 3.5–5.0)
Anion gap: 11 (ref 5–15)
BUN: 123 mg/dL — AB (ref 6–20)
CO2: 23 mmol/L (ref 22–32)
CREATININE: 6.15 mg/dL — AB (ref 0.61–1.24)
Calcium: 8.5 mg/dL — ABNORMAL LOW (ref 8.9–10.3)
Chloride: 110 mmol/L (ref 101–111)
GFR calc non Af Amer: 9 mL/min — ABNORMAL LOW (ref >60)
GFR, EST AFRICAN AMERICAN: 10 mL/min — AB (ref >60)
Glucose, Bld: 118 mg/dL — ABNORMAL HIGH (ref 65–99)
POTASSIUM: 4.9 mmol/L (ref 3.5–5.1)
Phosphorus: 6.8 mg/dL — ABNORMAL HIGH (ref 2.5–4.6)
Sodium: 144 mmol/L (ref 135–145)

## 2016-06-20 LAB — IRON AND TIBC
IRON: 44 ug/dL — AB (ref 45–182)
Saturation Ratios: 12 % — ABNORMAL LOW (ref 17.9–39.5)
TIBC: 372 ug/dL (ref 250–450)
UIBC: 328 ug/dL

## 2016-06-20 LAB — GLUCOSE, CAPILLARY
GLUCOSE-CAPILLARY: 161 mg/dL — AB (ref 65–99)
GLUCOSE-CAPILLARY: 147 mg/dL — AB (ref 65–99)
Glucose-Capillary: 110 mg/dL — ABNORMAL HIGH (ref 65–99)
Glucose-Capillary: 172 mg/dL — ABNORMAL HIGH (ref 65–99)

## 2016-06-20 LAB — FERRITIN: FERRITIN: 82 ng/mL (ref 24–336)

## 2016-06-20 MED ORDER — CALCIUM ACETATE (PHOS BINDER) 667 MG PO CAPS
667.0000 mg | ORAL_CAPSULE | Freq: Two times a day (BID) | ORAL | Status: DC
  Administered 2016-06-20 – 2016-06-28 (×14): 667 mg via ORAL
  Filled 2016-06-20 (×14): qty 1

## 2016-06-20 MED ORDER — ROSUVASTATIN CALCIUM 20 MG PO TABS
20.0000 mg | ORAL_TABLET | Freq: Every day | ORAL | Status: DC
  Administered 2016-06-20 – 2016-06-25 (×6): 20 mg via ORAL
  Filled 2016-06-20 (×6): qty 1

## 2016-06-20 MED ORDER — OFLOXACIN 0.3 % OP SOLN
1.0000 [drp] | Freq: Two times a day (BID) | OPHTHALMIC | Status: DC
  Administered 2016-06-20 – 2016-06-28 (×18): 1 [drp] via OPHTHALMIC
  Filled 2016-06-20: qty 5

## 2016-06-20 MED ORDER — PREDNISOLONE ACETATE 1 % OP SUSP
1.0000 [drp] | OPHTHALMIC | Status: DC
  Administered 2016-06-20 – 2016-06-29 (×60): 1 [drp] via OPHTHALMIC
  Filled 2016-06-20 (×2): qty 1

## 2016-06-20 NOTE — Progress Notes (Signed)
PROGRESS NOTE  [name] VOH:[id number] DOB: [birth year] DOA: 06/18/2016 PCP: Lanette Hampshire, MD (Inactive)  Brief Narrative: 61 year old man PMH diabetes mellitus, bilateral BKA, AICD, CHF, C KD with AV fistula placed but not on dialysis, presented with increasing swelling, weight gain and shortness of breath. Admitted for hypertensive urgency, CHF exacerbation, volume overload, acute hypoxic respiratory failure.  Assessment/Plan 1. Acute hypoxic respiratory failure secondary to acute CHF, volume overload  Symptomatically improved. Wean oxygen as tolerated. 2. Acute combined systolic/diastolic congestive heart failure with volume overload, complicated by progressive chronic kidney disease. Suspect CHF precipitated by progressive renal disease. CHF listed on chart but no previous echocardiogram on file. Suspect chronic.  Obtain any previous echocardiogram report of possible  Troponins negative, EKG nonacute, no evidence of ACS.  Diuretics per nephrology 3. Acute kidney injury superimposed on chronic kidney disease stage IV. Creatinine somewhat worse today.  Management per nephrology. Currently on IV Lasix. Check BMP in the morning. 4. PMH nonischemic cardiomyopathy 5. Hypertensive urgency/accelerated hypertension, resolved  Continue Coreg, clonidine, Aldactone, amlodipine. 6. Diabetes mellitus with associated peripheral vascular disease, status post bilateral BKA.  Blood sugars remain stable. Continue sliding scale insulin.   Symptomatically improved, hemodynamically stable but renal function somewhat worse today. May need hemodialysis. Defer management to nephrology.  DVT prophylaxis: heparin Code Status: full code Family Communication: none Disposition Plan: home    Murray Hodgkins, MD  [facility] Direct contact: [phone] --[place]ia [place]  --[email or web address]; password [email or web address]  7PM-7AM contact night coverage as above 06/20/2016, 9:03 AM  LOS: 1 day    Consultants:  Nephrology   Procedures:  2-d echo Study Conclusions  - Left ventricle: The cavity size was moderately dilated. There was   moderate concentric hypertrophy. Systolic function was moderately   reduced. The estimated ejection fraction was in the range of 35%   to 40%. There is akinesis of the basal-midanteroseptal and   inferoseptal myocardium. Features are consistent with a   pseudonormal left ventricular filling pattern, with concomitant   abnormal relaxation and increased filling pressure (grade 2   diastolic dysfunction). - Ventricular septum: Septal motion showed mild paradox. These   changes are consistent with right ventricular pacing. - Mitral valve: Calcified annulus. Mildly thickened leaflets . - Left atrium: The atrium was mildly dilated. Anterior-posterior   dimension: 43 mm. - Right ventricle: Pacer wire or catheter noted in right ventricle.  Antimicrobials:    Interval history/Subjective: No issues overnight. Breathing well. No pain. Still has significant lower extremity edema.  Objective: Vitals:   06/19/16 2240 06/20/16 0000 06/20/16 0400 06/20/16 0500  BP: (!) 154/89     Pulse:      Resp:      Temp:  98.2 F (36.8 C) 98.3 F (36.8 C)   TempSrc:  Oral Oral   SpO2:      Weight:    121 kg (266 lb 12.1 oz)  Height:        Intake/Output Summary (Last 24 hours) at 06/20/16 0903 Last data filed at 06/20/16 0430  Gross per 24 hour  Intake          1033.05 ml  Output             2170 ml  Net         -1136.95 ml     Filed Weights   06/18/16 2213 06/19/16 0500 06/20/16 0500  Weight: 113.4 kg (250 lb) 120.6 kg (265 lb 14 oz) 121 kg (266 lb 12.1 oz)  Exam: Constitutional: Appears calm, comfortable. Sitting on the side of the bed. Respiratory: Clear to auscultation bilaterally. No wheezes, rales or rhonchi. Normal respiratory effort. Cardiovascular: Regular rate and rhythm. No murmur, rub or gallop. 2+ bilateral lower extremity  edema. Bilateral BKA. Telemetry sinus rhythm. Psychiatric: Grossly normal mood and affect. Speech fluent and appropriate.   I have personally reviewed following labs and imaging studies:  Blood sugars stable.  Creatinine slightly higher, 6.15. Potassium 4.9. BUN increased, 123  Troponins flat, 0.04  Hemoglobin stable 8.4. Remainder CBC unremarkable.  Echocardiogram noted  Scheduled Meds: . amLODipine  10 mg Oral Daily  . aspirin EC  81 mg Oral Daily  . carvedilol  25 mg Oral BID  . cloNIDine  0.1 mg Oral TID  . clopidogrel  75 mg Oral Daily  . furosemide  80 mg Intravenous Q12H  . heparin  5,000 Units Subcutaneous Q8H  . insulin aspart  0-9 Units Subcutaneous TID WC  . sodium chloride flush  3 mL Intravenous Q12H  . spironolactone  25 mg Oral BID   Continuous Infusions: . nitroGLYCERIN Stopped (06/19/16 1853)    Principal Problem:   CHF exacerbation (HCC) Active Problems:   Essential hypertension   Peripheral vascular disease (HCC)   Chronic kidney disease (CKD), stage IV (severe) (HCC)   Acute hypoxemic respiratory failure (HCC)   Volume overload   AKI (acute kidney injury) ([place])   DM (diabetes mellitus), type 2 with peripheral vascular complications ([place])   LOS: 1 day

## 2016-06-20 NOTE — Progress Notes (Signed)
Patient weaned to room air, O2 SAT @ 94% RA at this time. Patient denies any SOB or respiratory distress at this time. Patient reported that his PCP is now Dr.James Kim. Attempted to notify office to obtain medical records.

## 2016-06-20 NOTE — Progress Notes (Signed)
KENI ELISON  MRN: [id number]  DOB/AGE: 63-May-1957 63 y.o.  Primary [place], MD (Inactive)  Admit date: 06/18/2016  Chief Complaint:  Chief Complaint  Patient presents with  . Shortness of Breath    S-Pt presented on  06/18/2016 with  Chief Complaint  Patient presents with  . Shortness of Breath  .    Pt today feels better  Meds . amLODipine  10 mg Oral Daily  . aspirin EC  81 mg Oral Daily  . carvedilol  25 mg Oral BID  . cloNIDine  0.1 mg Oral TID  . clopidogrel  75 mg Oral Daily  . furosemide  80 mg Intravenous Q12H  . heparin  5,000 Units Subcutaneous Q8H  . insulin aspart  0-9 Units Subcutaneous TID WC  . ofloxacin  1 drop Right Eye BID  . prednisoLONE acetate  1 drop Right Eye Q2H while awake  . rosuvastatin  20 mg Oral Daily  . sodium chloride flush  3 mL Intravenous Q12H  . spironolactone  25 mg Oral BID         Physical Exam: Vital signs in last 24 hours: Temp:  [97.5 F (36.4 C)-98.3 F (36.8 C)] 98.3 F (36.8 C) (03/19 0400) Pulse Rate:  [62] 62 (03/19 0700) Resp:  [20] 20 (03/19 0700) BP: (151-165)/(65-89) 152/84 (03/19 0700) SpO2:  [97 %] 97 % (03/19 0700) Weight:  [266 lb 12.1 oz (121 kg)] 266 lb 12.1 oz (121 kg) (03/19 0500) Weight change: 16 lb 12.1 oz (7.601 kg) Last BM Date: 06/17/16  Intake/Output from previous day: 03/18 0701 - 03/19 0700 In: 1220.7 [P.O.:890; I.V.:330.7] Out: 2220 [Urine:2220] No intake/output data recorded.   Physical Exam: General- pt is awake,alert, oriented to time place and person Resp- No acute REsp distress,decreased at bases. Rhonchi+ CVS- S1S2 regular in rate and rhythm GIT- BS+, soft, NT, ND EXT- 1+ LE Edema, NO Cyanosis           b/l bka Access- left AVF + bruit  Lab Results: CBC  Recent Labs  06/19/16 1143 06/20/16 0436  WBC 8.1 8.1  HGB 8.2* 8.4*  HCT 25.4* 26.8*  PLT 191 183    BMET  Recent Labs  06/18/16 2310 06/19/16 1143 06/20/16 0436  NA 141  --   144  K 4.8  --  4.9  CL 108  --  110  CO2 23  --  23  GLUCOSE 144*  --  118*  BUN 92*  --  123*  CREATININE 5.98* 6.02* 6.15*  CALCIUM 8.2*  --  8.5*   Creat trend 2018  5.98-6.15 2016  4.53 2013   3.46 2012   2.0--3.0    MICRO Recent Results (from the past 240 hour(s))  MRSA PCR Screening     Status: None   Collection Time: 06/19/16  4:31 AM  Result Value Ref Range Status   MRSA by PCR NEGATIVE NEGATIVE Final    Comment:        The GeneXpert MRSA Assay (FDA approved for NASAL specimens only), is one component of a comprehensive MRSA colonization surveillance program. It is not intended to diagnose MRSA infection nor to guide or monitor treatment for MRSA infections.       Lab Results  Component Value Date   CALCIUM 8.5 (L) 06/20/2016   CAION 1.17 10/07/2010   PHOS 6.8 (H) 06/20/2016   Alb  3.5 Corrected Calcium  8.5+0.4=8.9      CXR 06/18/16 Likely congestive heart  failure with cardiac enlargement, pulmonary vascular congestion, perihilar edema, and small right pleural effusion.     Impression: 1)Renal  AKI secondary to Cardiorenal/ATN                AKI vs CKD prgression               CKD stage 5.               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                Nephrolithiasis Hx Absent   2)HTN  Medication- On RAS blockers- Spironolactone On Diuretics-Lasix. On Calcium Channel Blockers On Alpha and beta Blockers. On [employer].   3)Anemia HGb low   4)CKD Mineral-Bone Disorder PTH not avail . Secondary Hyperparathyroidism w/u pending.. Phosphorus not at goal  5)CHF-admitted with CHF exacerbation Primary MD following  6)Electrolytes Normokalemic NOrmonatremic   7)Acid base Co2 at goal     Plan:  Will start PO Binders for high Phos  Will ask for bmet and cxr  in am   Pt BUN/creat trending high  Educated pt about possible ned of HD soon.      BHUTANI,MANPREET S 06/20/2016, 9:11 AM

## 2016-06-21 ENCOUNTER — Inpatient Hospital Stay (HOSPITAL_COMMUNITY): Payer: Medicare Other

## 2016-06-21 LAB — GLUCOSE, CAPILLARY
GLUCOSE-CAPILLARY: 108 mg/dL — AB (ref 65–99)
GLUCOSE-CAPILLARY: 158 mg/dL — AB (ref 65–99)
Glucose-Capillary: 144 mg/dL — ABNORMAL HIGH (ref 65–99)
Glucose-Capillary: 208 mg/dL — ABNORMAL HIGH (ref 65–99)

## 2016-06-21 LAB — CBC
HCT: 25.4 % — ABNORMAL LOW (ref 39.0–52.0)
Hemoglobin: 8.3 g/dL — ABNORMAL LOW (ref 13.0–17.0)
MCH: 30.1 pg (ref 26.0–34.0)
MCHC: 32.7 g/dL (ref 30.0–36.0)
MCV: 92 fL (ref 78.0–100.0)
Platelets: 171 10*3/uL (ref 150–400)
RBC: 2.76 MIL/uL — ABNORMAL LOW (ref 4.22–5.81)
RDW: 13.9 % (ref 11.5–15.5)
WBC: 5.6 10*3/uL (ref 4.0–10.5)

## 2016-06-21 LAB — RENAL FUNCTION PANEL
ALBUMIN: 3.4 g/dL — AB (ref 3.5–5.0)
Anion gap: 11 (ref 5–15)
BUN: 132 mg/dL — AB (ref 6–20)
CHLORIDE: 106 mmol/L (ref 101–111)
CO2: 25 mmol/L (ref 22–32)
CREATININE: 6.27 mg/dL — AB (ref 0.61–1.24)
Calcium: 8.4 mg/dL — ABNORMAL LOW (ref 8.9–10.3)
GFR calc Af Amer: 10 mL/min — ABNORMAL LOW (ref >60)
GFR, EST NON AFRICAN AMERICAN: 8 mL/min — AB (ref >60)
GLUCOSE: 140 mg/dL — AB (ref 65–99)
PHOSPHORUS: 6.5 mg/dL — AB (ref 2.5–4.6)
Potassium: 4.7 mmol/L (ref 3.5–5.1)
Sodium: 142 mmol/L (ref 135–145)

## 2016-06-21 LAB — IRON AND TIBC
Iron: 34 ug/dL — ABNORMAL LOW (ref 45–182)
Saturation Ratios: 9 % — ABNORMAL LOW (ref 17.9–39.5)
TIBC: 358 ug/dL (ref 250–450)
UIBC: 324 ug/dL

## 2016-06-21 LAB — FERRITIN: Ferritin: 82 ng/mL (ref 24–336)

## 2016-06-21 MED ORDER — SODIUM CHLORIDE 0.9 % IV SOLN: 100.0000 mL | INTRAVENOUS | Status: DC | PRN

## 2016-06-21 MED ORDER — EPOETIN ALFA 4000 UNIT/ML IJ SOLN
INTRAMUSCULAR | Status: AC
  Filled 2016-06-21: qty 1

## 2016-06-21 MED ORDER — LIDOCAINE-PRILOCAINE 2.5-2.5 % EX CREA
1.0000 "application " | TOPICAL_CREAM | CUTANEOUS | Status: DC | PRN
  Filled 2016-06-21: qty 5

## 2016-06-21 MED ORDER — EPOETIN ALFA 2000 UNIT/ML IJ SOLN
2000.0000 [IU] | INTRAMUSCULAR | Status: DC
  Administered 2016-06-21: 2000 [IU] via SUBCUTANEOUS
  Filled 2016-06-21 (×2): qty 1

## 2016-06-21 MED ORDER — TUBERCULIN PPD 5 UNIT/0.1ML ID SOLN
5.0000 [IU] | Freq: Once | INTRADERMAL | Status: AC
  Administered 2016-06-21: 5 [IU] via INTRADERMAL
  Filled 2016-06-21: qty 0.1

## 2016-06-21 MED ORDER — LIDOCAINE HCL (PF) 1 % IJ SOLN: 5.0000 mL | INTRAMUSCULAR | Status: DC | PRN

## 2016-06-21 MED ORDER — RENA-VITE PO TABS
1.0000 | ORAL_TABLET | Freq: Every day | ORAL | Status: DC
  Administered 2016-06-21 – 2016-06-28 (×8): 1 via ORAL
  Filled 2016-06-21 (×8): qty 1

## 2016-06-21 MED ORDER — PENTAFLUOROPROP-TETRAFLUOROETH EX AERO
1.0000 "application " | INHALATION_SPRAY | CUTANEOUS | Status: DC | PRN
  Filled 2016-06-21: qty 30

## 2016-06-21 MED ORDER — ALTEPLASE 2 MG IJ SOLR
2.0000 mg | Freq: Once | INTRAMUSCULAR | Status: DC | PRN
Start: 2016-06-21 — End: 2016-06-29
  Filled 2016-06-21: qty 2

## 2016-06-21 MED ORDER — HEPARIN SODIUM (PORCINE) 1000 UNIT/ML DIALYSIS
1000.0000 [IU] | INTRAMUSCULAR | Status: DC | PRN
  Filled 2016-06-21: qty 1

## 2016-06-21 NOTE — Progress Notes (Signed)
Pt's TB test placed in right forearm. Marked. To be read in 48 hours at 1626 on 06/23/16.

## 2016-06-21 NOTE — Care Management Note (Signed)
Case Management Note  Patient Details  Name: [name] MRN: [id number] Date of Birth: December 29, 1955  Subjective/Objective:   Adm with CHF/AKI/CKD. He lives alone, bilateral BKA. Has prosthesis and cane PTA. Reports he uses WC mostly for mobility due to leg swelling and not being able to wear prosthesis. He has good family/friend support. He has a PCP, friend drives him to MD appointments. He has no HH PTA, but [employer] RN visits twice a year. He request a new WC.  Patient will require HD today.    Action/Plan: Juliann Pulse of [facility] will check to see if patient is eligible for a new WC. CM will follow for ongoing needs.    Expected Discharge Date:  06/21/16               Expected Discharge Plan:  Home/Self Care  In-House Referral:     Discharge planning Services  CM Consult  Post Acute Care Choice:  Durable Medical Equipment Choice offered to:     DME Arranged:  Wheelchair manual DME Agency:  [place]:    [facility] Agency:     Status of Service:  In process, will continue to follow  If discussed at Long Length of Stay Meetings, dates discussed:    Additional Comments:  Pecolia Marando, Chauncey [place], RN 06/21/2016, 11:28 AM

## 2016-06-21 NOTE — Progress Notes (Signed)
[place] with Caren Griffins, RN (dialysis nurse) regarding new order for patient to have hemodialysis today.

## 2016-06-21 NOTE — Care Management (Signed)
    Durable Medical Equipment        Start     Ordered   06/21/16 1137  For home use only DME standard manual wheelchair with seat cushion  Once    Comments:  Patient suffers from bilateral bka and leg edema which impairs their ability to perform daily activities like wearing prosthesis for ambulation in the home.  A walker will not resolve  issue with performing activities of daily living. A wheelchair will allow patient to safely perform daily activities. Patient can safely propel the wheelchair in the home or has a caregiver who can provide assistance.  Accessories: elevating leg rests (ELRs), wheel locks, extensions and anti-tippers.   06/21/16 1137

## 2016-06-21 NOTE — Progress Notes (Signed)
PROGRESS NOTE  [name] HAL:[id number] DOB: 1955/09/26 DOA: 06/18/2016 PCP: [name], MD  Brief Narrative: 61 year old man PMH diabetes mellitus, bilateral BKA, AICD, CHF, C KD with AV fistula placed but not on dialysis, presented with increasing swelling, weight gain and shortness of breath. Admitted for hypertensive urgency, CHF exacerbation, volume overload, acute hypoxic respiratory failure. Has subjectively improved with diuresis and acute hypoxia has resolved although renal function continues to worsen. Nephrology following.  Assessment/Plan 1. Acute hypoxic respiratory failure secondary to acute CHF.  Resolved. 2. Acute kidney injury superimposed on chronic kidney disease stage IV. Creatinine a bit worse today but may be peaking. Excellent urine output.  Continue management per nephrology. Check BMP in the morning. 3. Acute combined systolic, diastolic congestive heart failure with volume overload, complicated by progressive chronic kidney disease. Suspect primary issues actually progressive renal disease. CHF listed as a medical problem but no previous echocardiogram on file. Suspect this is chronic. EKG nonacute, troponins negative, no evidence of ACS.  Symptomatically improved, no shortness of breath. However continues to have significant volume overload.  Continue diuretics per nephrology.  Most recent outpatient echocardiogram has been requested for comparison purposes 4. PMH nonischemic cardiomyopathy 5. Hypertensive urgency, accelerated hypertension, resolved.  Continue amlodipine, Coreg, clonidine, Aldactone 6. Diabetes mellitus with associated peripheral vascular disease, status post bilateral BKA.  Remains stable. Continue sliding scale insulin.   Symptomatically doing well. Renal function slightly worse. Defer management to nephrology.  DVT prophylaxis: heparin Code Status: full code Family Communication: none Disposition Plan: home    Murray Hodgkins,  MD  [facility] Direct contact: [phone] --Via [place]  --[email or web address]; password [email or web address]  7PM-7AM contact night coverage as above 06/21/2016, 9:37 AM  LOS: 2 days   Consultants:  Nephrology   Procedures:  2-d echo Study Conclusions  - Left ventricle: The cavity size was moderately dilated. There was   moderate concentric hypertrophy. Systolic function was moderately   reduced. The estimated ejection fraction was in the range of 35%   to 40%. There is akinesis of the basal-midanteroseptal and   inferoseptal myocardium. Features are consistent with a   pseudonormal left ventricular filling pattern, with concomitant   abnormal relaxation and increased filling pressure (grade 2   diastolic dysfunction). - Ventricular septum: Septal motion showed mild paradox. These   changes are consistent with right ventricular pacing. - Mitral valve: Calcified annulus. Mildly thickened leaflets . - Left atrium: The atrium was mildly dilated. Anterior-posterior   dimension: 43 mm. - Right ventricle: Pacer wire or catheter noted in right ventricle.  Antimicrobials:    Interval history/Subjective: Doing well. Breathing well. Urine output has increased per nursing.  Objective: Vitals:   06/21/16 0400 06/21/16 0500 06/21/16 0600 06/21/16 0722  BP: (!) 156/75 (!) 163/79 (!) 165/82   Pulse: 60 62 69 67  Resp: 11 (!) 21 (!) 27 16  Temp: 98.2 F (36.8 C)   98.2 F (36.8 C)  TempSrc: Oral   Oral  SpO2: 91% 95% 96% (!) 88%  Weight:  118.8 kg (261 lb 14.5 oz)    Height:        Intake/Output Summary (Last 24 hours) at 06/21/16 0937 Last data filed at 06/21/16 0841  Gross per 24 hour  Intake             1040 ml  Output             2525 ml  Net            -  1485 ml     Filed Weights   06/19/16 0500 06/20/16 0500 06/21/16 0500  Weight: 120.6 kg (265 lb 14 oz) 121 kg (266 lb 12.1 oz) 118.8 kg (261 lb 14.5 oz)    Exam: Constitutional: Appears calm, comfortable. Sitting up  in chair. Cardiovascular: Regular rate and rhythm. No murmur, rub or gallop. No significant change in bilateral lower extremity edema. Bilateral BKAs noted. Respiratory: Clear to auscultation bilaterally. No wheezes, rales or rhonchi. Normal respiratory effort. Speaks in full sentences. Psychiatric: Grossly normal mood and affect. Speech fluent and appropriate.  I have personally reviewed following labs and imaging studies:  Urine output 2875  Blood sugars remain well controlled  BUN, creatinine increasing, 132/6.27 respectively.  Scheduled Meds: . amLODipine  10 mg Oral Daily  . aspirin EC  81 mg Oral Daily  . calcium acetate  667 mg Oral BID WC  . carvedilol  25 mg Oral BID  . cloNIDine  0.1 mg Oral TID  . clopidogrel  75 mg Oral Daily  . furosemide  80 mg Intravenous Q12H  . heparin  5,000 Units Subcutaneous Q8H  . insulin aspart  0-9 Units Subcutaneous TID WC  . ofloxacin  1 drop Right Eye BID  . prednisoLONE acetate  1 drop Right Eye Q2H while awake  . rosuvastatin  20 mg Oral Daily  . sodium chloride flush  3 mL Intravenous Q12H  . spironolactone  25 mg Oral BID   Continuous Infusions:   Principal Problem:   Acute on chronic combined systolic and diastolic CHF (congestive heart failure) (HCC) Active Problems:   Essential hypertension   Peripheral vascular disease (HCC)   Chronic kidney disease (CKD), stage IV (severe) (HCC)   Acute hypoxemic respiratory failure (HCC)   Volume overload   AKI (acute kidney injury) ([place])   DM (diabetes mellitus), type 2 with peripheral vascular complications ([place])   LOS: 2 days

## 2016-06-21 NOTE — Progress Notes (Addendum)
[name]  MRN: [id number]  DOB/AGE: [birth year] 61 y.o.  Primary Care Physician:James Maudie Mercury, MD  Admit date: 06/18/2016  Chief Complaint:  Chief Complaint  Patient presents with  . Shortness of Breath    [name] presented on  06/18/2016 with  Chief Complaint  Patient presents with  . Shortness of Breath  .    Pt says " I think I need dialysis"   Meds . amLODipine  10 mg Oral Daily  . aspirin EC  81 mg Oral Daily  . calcium acetate  667 mg Oral BID WC  . carvedilol  25 mg Oral BID  . cloNIDine  0.1 mg Oral TID  . clopidogrel  75 mg Oral Daily  . furosemide  80 mg Intravenous Q12H  . heparin  5,000 Units Subcutaneous Q8H  . insulin aspart  0-9 Units Subcutaneous TID WC  . ofloxacin  1 drop Right Eye BID  . prednisoLONE acetate  1 drop Right Eye Q2H while awake  . rosuvastatin  20 mg Oral Daily  . sodium chloride flush  3 mL Intravenous Q12H  . spironolactone  25 mg Oral BID         Physical Exam: Vital signs in last 24 hours: Temp:  [97.7 F (36.5 C)-98.5 F (36.9 C)] 98.2 F (36.8 C) (03/20 0722) Pulse Rate:  [57-69] 67 (03/20 0722) Resp:  [8-27] 16 (03/20 0722) BP: (139-165)/(69-122) 165/82 (03/20 0600) SpO2:  [88 %-96 %] 88 % (03/20 0722) Weight:  [261 lb 14.5 oz (118.8 kg)] 261 lb 14.5 oz (118.8 kg) (03/20 0500) Weight change: -4 lb 13.6 oz (-2.2 kg) Last BM Date: 06/20/16  Intake/Output from previous day: 03/19 0701 - 03/20 0700 In: 1203 [P.O.:1200; I.V.:3] Out: 2875 [Urine:2875] Total I/O In: 320 [P.O.:320] Out: 150 [Urine:150]   Physical Exam: General- pt is awake,alert, oriented to time place and person Resp- No acute REsp distress,decreased at bases. CVS- S1S2 regular in rate and rhythm GIT- BS+, soft, NT, ND EXT- 2+ LE Edema, NO Cyanosis           b/l bka Access- left AVF + bruit  Lab Results: CBC  Recent Labs  06/19/16 1143 06/20/16 0436  WBC 8.1 8.1  HGB 8.2* 8.4*  HCT 25.4* 26.8*  PLT 191 183    BMET  Recent Labs  06/20/16 0436 06/21/16 0422  NA 144 142  K 4.9 4.7  CL 110 106  CO2 23 25  GLUCOSE 118* 140*  BUN 123* 132*  CREATININE 6.15* 6.27*  CALCIUM 8.5* 8.4*   Creat trend 2018  5.98-6.2 2016  4.53 2013   3.46 2012   2.0--3.0    MICRO Recent Results (from the past 240 hour([name]))  MRSA PCR Screening     Status: None   Collection Time: 06/19/16  4:31 AM  Result Value Ref Range Status   MRSA by PCR NEGATIVE NEGATIVE Final    Comment:        The GeneXpert MRSA Assay (FDA approved for NASAL specimens only), is one component of a comprehensive MRSA colonization surveillance program. It is not intended to diagnose MRSA infection nor to guide or monitor treatment for MRSA infections.       Lab Results  Component Value Date   CALCIUM 8.4 (L) 06/21/2016   CAION 1.17 10/07/2010   PHOS 6.5 (H) 06/21/2016   Alb  3.5 Corrected Calcium  8.4+0.4=8.8      CXR 06/18/16 Likely congestive heart failure with cardiac enlargement, pulmonary vascular congestion, perihilar  edema, and small right pleural effusion.   CXR 06/21/16 Improvement in aeration. Residual mild perihilar interstitial prominence without convincing pulmonary edema. Mild left basilar atelectasis. Trace bilateral pleural effusion again noted  Impression: 1)Renal  CKD progression => now ESRD               CKD stage 5.               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                Nephrolithiasis Hx Absent                 Will initiate HD today              Data in favor              BUN more than 100              GFR 44ml/min              Admitted with fluid overload    2)HTN  Medication- On RAS blockers- Spironolactone On Diuretics-Lasix. On Calcium Channel Blockers On Alpha and beta Blockers. On [employer].   3)Anemia HGb low   4)CKD  Mineral-Bone Disorder Phosphorus not at goal    On binders Calcium at goal after correcting for low albumin  5)CHF-admitted with CHF exacerbation             Pt is negative by more than 5 liters              Responded well to diuresis              Primary MD following  6)Electrolytes Normokalemic NOrmonatremic   7)Acid base Co2 at goal     Plan:  Pt BUN is more than 120. Pt has lost 5 liters but still has 2+ edema Will initiate HD today Will change diuretics to PO in am  Will ask for ppd Will ask for hep profile Will keep on epo Will ask for emla    [name] 06/21/2016, 9:23 AM

## 2016-06-22 DIAGNOSIS — I5023 Acute on chronic systolic (congestive) heart failure: Secondary | ICD-10-CM

## 2016-06-22 DIAGNOSIS — I739 Peripheral vascular disease, unspecified: Secondary | ICD-10-CM

## 2016-06-22 DIAGNOSIS — N185 Chronic kidney disease, stage 5: Secondary | ICD-10-CM

## 2016-06-22 LAB — HEPATITIS B SURFACE ANTIGEN: Hepatitis B Surface Ag: NEGATIVE

## 2016-06-22 LAB — CBC WITH DIFFERENTIAL/PLATELET
Basophils Absolute: 0 10*3/uL (ref 0.0–0.1)
Basophils Relative: 0 %
Eosinophils Absolute: 0.3 10*3/uL (ref 0.0–0.7)
Eosinophils Relative: 4 %
HCT: 27.5 % — ABNORMAL LOW (ref 39.0–52.0)
Hemoglobin: 8.9 g/dL — ABNORMAL LOW (ref 13.0–17.0)
Lymphocytes Relative: 14 %
Lymphs Abs: 1.1 10*3/uL (ref 0.7–4.0)
MCH: 30.2 pg (ref 26.0–34.0)
MCHC: 32.4 g/dL (ref 30.0–36.0)
MCV: 93.2 fL (ref 78.0–100.0)
Monocytes Absolute: 0.5 10*3/uL (ref 0.1–1.0)
Monocytes Relative: 6 %
Neutro Abs: 5.7 10*3/uL (ref 1.7–7.7)
Neutrophils Relative %: 76 %
Platelets: 191 10*3/uL (ref 150–400)
RBC: 2.95 MIL/uL — ABNORMAL LOW (ref 4.22–5.81)
RDW: 13.9 % (ref 11.5–15.5)
WBC: 7.5 10*3/uL (ref 4.0–10.5)

## 2016-06-22 LAB — GLUCOSE, CAPILLARY
GLUCOSE-CAPILLARY: 149 mg/dL — AB (ref 65–99)
GLUCOSE-CAPILLARY: 210 mg/dL — AB (ref 65–99)
GLUCOSE-CAPILLARY: 185 mg/dL — AB (ref 65–99)
Glucose-Capillary: 128 mg/dL — ABNORMAL HIGH (ref 65–99)
Glucose-Capillary: 114 mg/dL — ABNORMAL HIGH (ref 65–99)

## 2016-06-22 LAB — BASIC METABOLIC PANEL
Anion gap: 10 (ref 5–15)
BUN: 97 mg/dL — ABNORMAL HIGH (ref 6–20)
CO2: 28 mmol/L (ref 22–32)
Calcium: 8.4 mg/dL — ABNORMAL LOW (ref 8.9–10.3)
Chloride: 103 mmol/L (ref 101–111)
Creatinine, Ser: 5.01 mg/dL — ABNORMAL HIGH (ref 0.61–1.24)
GFR, EST AFRICAN AMERICAN: 13 mL/min — AB (ref >60)
GFR, EST NON AFRICAN AMERICAN: 11 mL/min — AB (ref >60)
GLUCOSE: 134 mg/dL — AB (ref 65–99)
Potassium: 4.2 mmol/L (ref 3.5–5.1)
SODIUM: 141 mmol/L (ref 135–145)

## 2016-06-22 LAB — HEPATITIS B CORE ANTIBODY, TOTAL: Hep B Core Total Ab: NEGATIVE

## 2016-06-22 LAB — HEPATITIS C ANTIBODY: HCV Ab: <0.1 s/co ratio (ref 0.0–0.9)

## 2016-06-22 LAB — HEPATITIS B SURFACE ANTIBODY,QUALITATIVE: Hep B S Ab: NONREACTIVE

## 2016-06-22 MED ORDER — FUROSEMIDE 80 MG PO TABS
80.0000 mg | ORAL_TABLET | Freq: Two times a day (BID) | ORAL | Status: DC
  Administered 2016-06-22 – 2016-06-28 (×12): 80 mg via ORAL
  Filled 2016-06-22 (×13): qty 1

## 2016-06-22 NOTE — Care Management Note (Signed)
Case Management Note  Patient Details  Name: [name] MRN: [id number] Date of Birth: October 12, 1955  Expected Discharge Date:  06/21/16               Expected Discharge Plan:  [place]  In-House Referral:  NA  Discharge planning Services  CM Consult  Post Acute Care Choice:  Durable Medical Equipment Choice offered to:  Patient  DME Arranged:  Wheelchair manual DME Agency:  [place]:  RN, Nurse's Aide [facility] Agency:  [place]  Status of Service:  In process, will continue to follow  Additional Comments: Pt started on HD. CSW making arrangements for OP HD. Pt requesting HH at DC as he is very weak. Pt has chosen [facility] from list of providers and [name], of [facility], Aware of referral.  Sherald Barge, RN 06/22/2016, 1:24 PM

## 2016-06-22 NOTE — Clinical Social Work Note (Signed)
Clinical Social Work Assessment  Patient Details  Name: [name] MRN: [id number] Date of Birth: [birth year]  Date of referral:  06/22/16               Reason for consult:  Other (Comment Required) (New Dialysis set up)                Permission sought to share information with:    Permission granted to share information::     Name::        Agency::     Relationship::     Contact Information:     Housing/Transportation Living arrangements for the past 2 months:  Single Family Home Source of Information:  Patient Patient Interpreter Needed:  None Criminal Activity/Legal Involvement Pertinent to Current Situation/Hospitalization:  No - Comment as needed Significant Relationships:  Adult Children, Other Family Members Lives with:  Self Do you feel safe going back to the place where you live?  Yes Need for family participation in patient care:  Yes (Comment)  Care giving concerns:  None identified.    Social Worker assessment / plan:  Patient lives alone, uses a wheelchair, and completes ADLs unassisted.  Patient wants dialysis at [facility], MWF pm schedule.  Patient was agreeable to dialysis.  He also stated that he would like home health services as well as a new wheelchair as he has had his since 2010. LCSW completed and faxed paperwork to [place].  Employment status:  Disabled (Comment on whether or not currently receiving Disability) Insurance information:  Managed Medicare PT Recommendations:  Not assessed at this time Information / Referral to community resources:  Acute Rehab  Patient/Family's Response to care:  Patient is agreeable to begin dialysis.   Patient/Family's Understanding of and Emotional Response to Diagnosis, Current Treatment, and Prognosis:  Patient understands his diagnosis treatment and prognosis.    Emotional Assessment Appearance:  Appears stated age Attitude/Demeanor/Rapport:   (Cooperative) Affect (typically observed):  Accepting,  Calm Orientation:  Oriented to Self, Oriented to Place, Oriented to  Time, Oriented to Situation Alcohol / Substance use:  Not Applicable Psych involvement (Current and /or in the community):  No (Comment)  Discharge Needs  Concerns to be addressed:  Other (Comment Required (New Dialysis Set up) Readmission within the last 30 days:  No Current discharge risk:  None Barriers to Discharge:  No Barriers Identified   Ihor Gully, LCSW 06/22/2016, 12:15 PM

## 2016-06-22 NOTE — Progress Notes (Signed)
PROGRESS NOTE    [name]  AQT:[id number] DOB: December 02, 1955 DOA: 06/18/2016 PCP: [name], MD    Brief Narrative:  61 year old man PMH diabetes mellitus, bilateral BKA, AICD, CHF, C KD with AV fistula placed but not on dialysis, presented with increasing swelling, weight gain and shortness of breath. Admitted for hypertensive urgency, CHF exacerbation, volume overload, acute hypoxic respiratory failure. Has subjectively improved with diuresis and acute hypoxia has resolved although renal function continues to worsen. Nephrology following. Patient was started on HD on 3/20.  On 3/21 his dialysis was stopped secondary to infiltration and when needle was withdrawn on both dialysis attempts clots were expressed.  He was transferred to [facility] on 3/21 for evaluation by vascular surgery and nephrology.   Assessment & Plan:   Principal Problem:   Acute on chronic combined systolic and diastolic CHF (congestive heart failure) (HCC) Active Problems:   Essential hypertension   Peripheral vascular disease (HCC)   Chronic kidney disease (CKD), stage IV (severe) (HCC)   Acute hypoxemic respiratory failure (HCC)   Volume overload   AKI (acute kidney injury) ([place])   DM (diabetes mellitus), type 2 with peripheral vascular complications (HCC)   Acute hypoxic respiratory failure secondary to acute CHF. - improved  Acute kidney injury superimposed on chronic kidney disease stage IV - Started on HD on 3/21 - Patient fistula clotted and infiltrated on 3/21 - will need transfer to [facility] for evaluation by vascular surgery as fistula clotted and infiltrated today x 2 in attempts for dialysis  Acute combined systolic, diastolic congestive heart failure with volume overload, complicated by progressive chronic kidney disease. - Symptomatically improved, no shortness of breath - significant volume overload and so dialysis was initiated - had dialysis treatment yesterday and attempted today  PMH nonischemic  cardiomyopathy  Hypertensive urgency, accelerated hypertension, resolved. - Continue amlodipine, Coreg, clonidine, Aldactone  Diabetes mellitus with associated peripheral vascular disease, status post bilateral BKA. - Remains stable. Continue sliding scale insulin.   DVT prophylaxis: heparin Code Status: full code Family Communication: none Disposition Plan: transfer to [facility] for evaluation by vascular surgery and nephrology    Consultants:   Nephrology  CM  Procedures:   Dialysis  Antimicrobials:   none    Subjective: Patient was to get dialysis today but line infiltrated and clots present.  Vascular surgery, Dr. Oneida Alar, at [facility] notified patient to transfer to [facility] for evaluation.  Dr. Augustin Coupe of nephrology at [facility] notified patient to be transferred to [facility].  Patient seen between two dialysis attempts and he is oriented and talking without increased work of breathing.  Patient reports that he has no questions and has a flat affect.  Objective: Vitals:   06/21/16 1430 06/21/16 1445 06/21/16 2100 06/22/16 0507  BP: (!) 158/67 (!) 170/71 (!) 148/71 (!) 157/73  Pulse: 64 65 70 65  Resp:  20 20 20   Temp:  98.5 F (36.9 C) 98.8 F (37.1 C) 98.4 F (36.9 C)  TempSrc:  Oral Oral Oral  SpO2:   94% 95%  Weight:    118.4 kg (261 lb 0.4 oz)  Height:        Intake/Output Summary (Last 24 hours) at 06/22/16 0808 Last data filed at 06/22/16 5625  Gross per 24 hour  Intake              920 ml  Output             2400 ml  Net            -  1480 ml   Filed Weights   06/21/16 0500 06/21/16 1200 06/22/16 0507  Weight: 118.8 kg (261 lb 14.5 oz) 118.8 kg (261 lb 14.5 oz) 118.4 kg (261 lb 0.4 oz)    Examination:  General exam: Appears calm and comfortable  Respiratory system: Respiratory effort normal. No wheezing, rhonchi or rales Cardiovascular system: S1 & S2 heard, RRR. No JVD, murmurs, rubs, gallops or clicks. No pedal edema. Gastrointestinal system: Abdomen is nondistended,  soft and nontender. No organomegaly or masses felt. Normal bowel sounds heard. Central nervous system: Alert and oriented. No focal neurological deficits. Extremities: bilateral BKA's Skin: scattered scaling erythema rash on upper extremities and lower extremities bilaterally Psychiatry: Judgement and insight appear normal. Mood & affect appropriate.     Data Reviewed: I have personally reviewed following labs and imaging studies  CBC:  Recent Labs Lab 06/18/16 2310 06/19/16 1143 06/20/16 0436 06/21/16 1249  WBC 12.6* 8.1 8.1 5.6  NEUTROABS 11.0*  --   --   --   HGB 9.0* 8.2* 8.4* 8.3*  HCT 28.6* 25.4* 26.8* 25.4*  MCV 93.8 93.7 94.0 92.0  PLT 228 191 183 546   Basic Metabolic Panel:  Recent Labs Lab 06/18/16 2310 06/19/16 1143 06/20/16 0436 06/21/16 0422 06/22/16 0528  NA 141  --  144 142 141  K 4.8  --  4.9 4.7 4.2  CL 108  --  110 106 103  CO2 23  --  23 25 28   GLUCOSE 144*  --  118* 140* 134*  BUN 92*  --  123* 132* 97*  CREATININE 5.98* 6.02* 6.15* 6.27* 5.01*  CALCIUM 8.2*  --  8.5* 8.4* 8.4*  PHOS  --   --  6.8* 6.5*  --    GFR: Estimated Creatinine Clearance: 20 mL/min (A) (by C-G formula based on SCr of 5.01 mg/dL (H)). Liver Function Tests:  Recent Labs Lab 06/20/16 0436 06/21/16 0422  ALBUMIN 3.5 3.4*   No results for input(s): LIPASE, AMYLASE in the last 168 hours. No results for input(s): AMMONIA in the last 168 hours. Coagulation Profile:  Recent Labs Lab 06/18/16 2310  INR 1.30   Cardiac Enzymes:  Recent Labs Lab 06/18/16 2310 06/19/16 0545 06/19/16 1143 06/19/16 1606  TROPONINI 0.03* 0.04* 0.04* 0.04*   BNP (last 3 results) No results for input(s): PROBNP in the last 8760 hours. HbA1C: No results for input(s): HGBA1C in the last 72 hours. CBG:  Recent Labs Lab 06/21/16 1139 06/21/16 1626 06/21/16 2044 06/22/16 0500 06/22/16 0741  GLUCAP 144* 158* 208* 128* 114*   Lipid Profile: No results for input(s): CHOL, HDL,  LDLCALC, TRIG, CHOLHDL, LDLDIRECT in the last 72 hours. Thyroid Function Tests: No results for input(s): TSH, T4TOTAL, FREET4, T3FREE, THYROIDAB in the last 72 hours. Anemia Panel:  Recent Labs  06/19/16 1606 06/21/16 1250  FERRITIN 82 82  TIBC 372 358  IRON 44* 34*   Sepsis Labs: No results for input(s): PROCALCITON, LATICACIDVEN in the last 168 hours.  Recent Results (from the past 240 hour(s))  MRSA PCR Screening     Status: None   Collection Time: 06/19/16  4:31 AM  Result Value Ref Range Status   MRSA by PCR NEGATIVE NEGATIVE Final    Comment:        The GeneXpert MRSA Assay (FDA approved for NASAL specimens only), is one component of a comprehensive MRSA colonization surveillance program. It is not intended to diagnose MRSA infection nor to guide or monitor treatment for MRSA infections.  Radiology Studies: Dg Chest 1 View  Result Date: 06/21/2016 CLINICAL DATA:  Congestive heart failure EXAM: CHEST 1 VIEW COMPARISON:  06/18/2016 FINDINGS: Cardiomegaly again noted. Single lead cardiac pacemaker is unchanged in position. Improvement in aeration. Residual mild perihilar interstitial prominence without convincing pulmonary edema. Mild left basilar atelectasis. Stable trace bilateral pleural effusion. IMPRESSION: Improvement in aeration. Residual mild perihilar interstitial prominence without convincing pulmonary edema. Mild left basilar atelectasis. Trace bilateral pleural effusion again noted Electronically Signed   By: Lahoma Crocker M.D.   On: 06/21/2016 09:15   US Renal  Result Date: 06/20/2016 CLINICAL DATA:  Acute kidney injury. EXAM: RENAL / URINARY TRACT ULTRASOUND COMPLETE COMPARISON:  Renal ultrasound of January 19, 2015. FINDINGS: Right Kidney: Length: 11.9 cm. Echogenicity within normal limits. No mass or hydronephrosis visualized. Left Kidney: Length: 13.1 cm. 4.9 cm simple cyst is noted medially. Echogenicity within normal limits. No mass or  hydronephrosis visualized. Bladder: Appears normal for degree of bladder distention. Ureteral jets are not visualized. IMPRESSION: Simple left renal cyst.  No hydronephrosis is noted. Electronically Signed   By: Marijo Conception, M.D.   On: 06/20/2016 10:23        Scheduled Meds: . amLODipine  10 mg Oral Daily  . aspirin EC  81 mg Oral Daily  . calcium acetate  667 mg Oral BID WC  . carvedilol  25 mg Oral BID  . cloNIDine  0.1 mg Oral TID  . clopidogrel  75 mg Oral Daily  . epoetin (EPOGEN/PROCRIT) injection  2,000 Units Subcutaneous Q T,Th,Sa-HD  . furosemide  80 mg Intravenous Q12H  . heparin  5,000 Units Subcutaneous Q8H  . insulin aspart  0-9 Units Subcutaneous TID WC  . multivitamin  1 tablet Oral QHS  . ofloxacin  1 drop Right Eye BID  . prednisoLONE acetate  1 drop Right Eye Q2H while awake  . rosuvastatin  20 mg Oral Daily  . sodium chloride flush  3 mL Intravenous Q12H  . spironolactone  25 mg Oral BID  . tuberculin  5 Units Intradermal Once   Continuous Infusions:   LOS: 3 days    Time spent: 35 minutes    Loretha Stapler, MD [facility] Pager [phone]  If 7PM-7AM, please contact night-coverage [email or web address] Password [email or web address] 06/22/2016, 8:08 AM

## 2016-06-22 NOTE — Progress Notes (Addendum)
[name]  MRN: [id number]  DOB/AGE: 09/23/1955 61 y.o.  Primary Care Physician:James Maudie Mercury, MD  Admit date: 06/18/2016  Chief Complaint:  Chief Complaint  Patient presents with  . Shortness of Breath    S-Pt presented on  06/18/2016 with  Chief Complaint  Patient presents with  . Shortness of Breath  .    Pt offers no new complaints .  Meds . amLODipine  10 mg Oral Daily  . aspirin EC  81 mg Oral Daily  . calcium acetate  667 mg Oral BID WC  . carvedilol  25 mg Oral BID  . cloNIDine  0.1 mg Oral TID  . clopidogrel  75 mg Oral Daily  . epoetin (EPOGEN/PROCRIT) injection  2,000 Units Subcutaneous Q T,Th,Sa-HD  . furosemide  80 mg Intravenous Q12H  . heparin  5,000 Units Subcutaneous Q8H  . insulin aspart  0-9 Units Subcutaneous TID WC  . multivitamin  1 tablet Oral QHS  . ofloxacin  1 drop Right Eye BID  . prednisoLONE acetate  1 drop Right Eye Q2H while awake  . rosuvastatin  20 mg Oral Daily  . sodium chloride flush  3 mL Intravenous Q12H  . spironolactone  25 mg Oral BID  . tuberculin  5 Units Intradermal Once         Physical Exam: Vital signs in last 24 hours: Temp:  [98.2 F (36.8 C)-98.8 F (37.1 C)] 98.4 F (36.9 C) (03/21 0507) Pulse Rate:  [60-70] 65 (03/21 0507) Resp:  [18-21] 20 (03/21 0507) BP: (138-175)/(66-109) 157/73 (03/21 0507) SpO2:  [91 %-95 %] 95 % (03/21 0507) Weight:  [261 lb 0.4 oz (118.4 kg)-261 lb 14.5 oz (118.8 kg)] 261 lb 0.4 oz (118.4 kg) (03/21 0507) Weight change: 0 lb (0 kg) Last BM Date: 06/21/16  Intake/Output from previous day: 03/20 0701 - 03/21 0700 In: 920 [P.O.:920] Out: 2400 [Urine:2400] No intake/output data recorded.   Physical Exam: General- pt is awake,alert, oriented to time place and person Resp- No acute REsp distress,decreased at bases. CVS- S1S2 regular in rate and rhythm GIT- BS+, soft, NT, ND EXT- 2+ LE Edema, NO Cyanosis           b/l bka Access- left AVF + bruit  Lab Results: CBC  Recent  Labs  06/20/16 0436 06/21/16 1249  WBC 8.1 5.6  HGB 8.4* 8.3*  HCT 26.8* 25.4*  PLT 183 171    BMET  Recent Labs  06/21/16 0422 06/22/16 0528  NA 142 141  K 4.7 4.2  CL 106 103  CO2 25 28  GLUCOSE 140* 134*  BUN 132* 97*  CREATININE 6.27* 5.01*  CALCIUM 8.4* 8.4*   Creat trend 2018  5.98-6.2 2016  4.53 2013   3.46 2012   2.0--3.0    MICRO Recent Results (from the past 240 hour(s))  MRSA PCR Screening     Status: None   Collection Time: 06/19/16  4:31 AM  Result Value Ref Range Status   MRSA by PCR NEGATIVE NEGATIVE Final    Comment:        The GeneXpert MRSA Assay (FDA approved for NASAL specimens only), is one component of a comprehensive MRSA colonization surveillance program. It is not intended to diagnose MRSA infection nor to guide or monitor treatment for MRSA infections.       Lab Results  Component Value Date   CALCIUM 8.4 (L) 06/22/2016   CAION 1.17 10/07/2010   PHOS 6.5 (H) 06/21/2016   Alb  3.5 Corrected Calcium  8.4+0.4=8.8      CXR 06/18/16 Likely congestive heart failure with cardiac enlargement, pulmonary vascular congestion, perihilar edema, and small right pleural effusion.   CXR 06/21/16 Improvement in aeration. Residual mild perihilar interstitial prominence without convincing pulmonary edema. Mild left basilar atelectasis. Trace bilateral pleural effusion again noted  Impression: 1)Renal  CKD progression => now ESRD               CKD stage 5.               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                Nephrolithiasis Hx Absent                HD initiated 06/21/16              PPD placed   2)HTN  Medication- On RAS blockers- Spironolactone On Diuretics-Lasix. On Calcium Channel Blockers On Alpha and beta Blockers. On [employer].   3)Anemia  HGb low   4)CKD Mineral-Bone Disorder Phosphorus not at goal    On binders Calcium at goal after correcting for low albumin  5)CHF-admitted with CHF exacerbation             Pt is negative by more than 5 liters              Responded well to diuresis              Primary MD following  6)Electrolytes Normokalemic NOrmonatremic   7)Acid base Co2 at goal     Plan:   Will dialyze today Will change diuretics to PO  Will use 3 k bath   Addendum Pt seen on Hd Pt tolerating tx well  Addendum I was later called by R regarding lines are having clots Will try again later If not able to complete tx-may need angioplasty   Brendon Christoffel S 06/22/2016, 9:14 AM

## 2016-06-22 NOTE — Progress Notes (Signed)
Patient transferred to [facility] from [facility]. Patient oriented to room. CCMD notified. Patient alert and oriented.In bed resting. No concerns at this time. Will continue to monitor patient.

## 2016-06-23 LAB — GLUCOSE, CAPILLARY
Glucose-Capillary: 115 mg/dL — ABNORMAL HIGH (ref 65–99)
Glucose-Capillary: 142 mg/dL — ABNORMAL HIGH (ref 65–99)
Glucose-Capillary: 194 mg/dL — ABNORMAL HIGH (ref 65–99)
Glucose-Capillary: 166 mg/dL — ABNORMAL HIGH (ref 65–99)

## 2016-06-23 LAB — PHOSPHORUS: Phosphorus: 4.7 mg/dL — ABNORMAL HIGH (ref 2.5–4.6)

## 2016-06-23 NOTE — Progress Notes (Signed)
PROGRESS NOTE    PRICE LACHAPELLE  YDX:[id number] DOB: [birth year] DOA: 06/18/2016 PCP: [name], MD    Brief Narrative:  61 year old man PMH diabetes mellitus, bilateral BKA, AICD, CHF-EF 20%, C KD with AV fistula placed but not on dialysis, presented with increasing swelling, weight gain and shortness of breath. Admitted for hypertensive urgency, CHF exacerbation, volume overload, acute hypoxic respiratory failure. Has subjectively improved with diuresis and acute hypoxia has resolved although renal function continues to worsen. Nephrology following. Patient was started on HD on 3/20.  On 3/21 his dialysis was stopped secondary to infiltration and when needle was withdrawn on both dialysis attempts clots were expressed.  He was transferred to [facility] for evaluation by vascular surgery and nephrology.   Assessment & Plan:   Acute hypoxic respiratory failure secondary to VOlume overload CKD5 - improved, following first HD on 3/20  Acute kidney injury superimposed on chronic kidney disease stage IV - Started on HD on 3/21 - Patient fistula clotted and infiltrated on 3/21 - appreciate VVS consult, plan for Fistulogram/PTA tomorrow  NICM/Cardiomyopathy previously EF 15-20%. Repeat ECHO with EF of 35-40% -EF 35-40%, volume managed with HD now -s/p AICD, lost to follow up   Hypertensive urgency, accelerated hypertension, resolved. - Continue amlodipine, Coreg, clonidine, stop Aldactone - will need dose adjustment when HD resumed  Diabetes mellitus - Remains stable. Continue sliding scale insulin.  Peripheral vascular disease, status post bilateral BKA.  DVT prophylaxis: heparin Code Status: full code Family Communication: none Disposition Plan: home when improved    Consultants:   Nephrology  CM  Procedures:   Dialysis  Antimicrobials:   none    Subjective: Feels ok, awaiting eval  Objective: Vitals:   06/22/16 2058 06/22/16 2246 06/23/16 0523 06/23/16 0938  BP: (!)  157/62 (!) 152/72 (!) 163/67 (!) 161/81  Pulse: 71 74 69 72  Resp: 20 20 20    Temp: 98.7 F (37.1 C) 98.8 F (37.1 C) 98 F (36.7 C)   TempSrc: Oral Oral Oral   SpO2: 95% 93% 93%   Weight:   114.4 kg (252 lb 3.2 oz)   Height:        Intake/Output Summary (Last 24 hours) at 06/23/16 1158 Last data filed at 06/23/16 0948  Gross per 24 hour  Intake              800 ml  Output             3050 ml  Net            -2250 ml   Filed Weights   06/22/16 0507 06/22/16 1000 06/23/16 0523  Weight: 118.4 kg (261 lb 0.4 oz) 118.4 kg (261 lb 0.4 oz) 114.4 kg (252 lb 3.2 oz)    Examination:  General exam: Appears calm and comfortable, no distress, pleasant Respiratory system: Respiratory effort normal. No wheezing, rhonchi or rales Cardiovascular system: S1 & S2 heard, RRR. No JVD, murmurs, rubs, gallops or clicks. No pedal edema. Gastrointestinal system: Abdomen is nondistended, soft and nontender. No organomegaly or masses felt. Normal bowel sounds heard. Central nervous system: Alert and oriented. No focal neurological deficits. Extremities: bilateral BKA's, LUE with AVF Skin: scattered scaling erythema rash on upper extremities and lower extremities bilaterally Psychiatry: Judgement and insight appear normal. Mood & affect appropriate.     Data Reviewed: I have personally reviewed following labs and imaging studies  CBC:  Recent Labs Lab 06/18/16 2310 06/19/16 1143 06/20/16 0436 06/21/16 1249 06/22/16 1525  WBC  12.6* 8.1 8.1 5.6 7.5  NEUTROABS 11.0*  --   --   --  5.7  HGB 9.0* 8.2* 8.4* 8.3* 8.9*  HCT 28.6* 25.4* 26.8* 25.4* 27.5*  MCV 93.8 93.7 94.0 92.0 93.2  PLT 228 191 183 171 323   Basic Metabolic Panel:  Recent Labs Lab 06/18/16 2310 06/19/16 1143 06/20/16 0436 06/21/16 0422 06/22/16 0528 06/23/16 0711  NA 141  --  144 142 141  --   K 4.8  --  4.9 4.7 4.2  --   CL 108  --  110 106 103  --   CO2 23  --  23 25 28   --   GLUCOSE 144*  --  118* 140* 134*  --    BUN 92*  --  123* 132* 97*  --   CREATININE 5.98* 6.02* 6.15* 6.27* 5.01*  --   CALCIUM 8.2*  --  8.5* 8.4* 8.4*  --   PHOS  --   --  6.8* 6.5*  --  4.7*   GFR: Estimated Creatinine Clearance: 19.7 mL/min (A) (by C-G formula based on SCr of 5.01 mg/dL (H)). Liver Function Tests:  Recent Labs Lab 06/20/16 0436 06/21/16 0422  ALBUMIN 3.5 3.4*   No results for input(s): LIPASE, AMYLASE in the last 168 hours. No results for input(s): AMMONIA in the last 168 hours. Coagulation Profile:  Recent Labs Lab 06/18/16 2310  INR 1.30   Cardiac Enzymes:  Recent Labs Lab 06/18/16 2310 06/19/16 0545 06/19/16 1143 06/19/16 1606  TROPONINI 0.03* 0.04* 0.04* 0.04*   BNP (last 3 results) No results for input(s): PROBNP in the last 8760 hours. HbA1C: No results for input(s): HGBA1C in the last 72 hours. CBG:  Recent Labs Lab 06/22/16 0741 06/22/16 1137 06/22/16 1831 06/22/16 2101 06/23/16 0821  GLUCAP 114* 149* 210* 185* 115*   Lipid Profile: No results for input(s): CHOL, HDL, LDLCALC, TRIG, CHOLHDL, LDLDIRECT in the last 72 hours. Thyroid Function Tests: No results for input(s): TSH, T4TOTAL, FREET4, T3FREE, THYROIDAB in the last 72 hours. Anemia Panel:  Recent Labs  06/21/16 1250  FERRITIN 82  TIBC 358  IRON 34*   Sepsis Labs: No results for input(s): PROCALCITON, LATICACIDVEN in the last 168 hours.  Recent Results (from the past 240 hour(s))  MRSA PCR Screening     Status: None   Collection Time: 06/19/16  4:31 AM  Result Value Ref Range Status   MRSA by PCR NEGATIVE NEGATIVE Final    Comment:        The GeneXpert MRSA Assay (FDA approved for NASAL specimens only), is one component of a comprehensive MRSA colonization surveillance program. It is not intended to diagnose MRSA infection nor to guide or monitor treatment for MRSA infections.          Radiology Studies: No results found.      Scheduled Meds: . amLODipine  10 mg Oral Daily    . aspirin EC  81 mg Oral Daily  . calcium acetate  667 mg Oral BID WC  . carvedilol  25 mg Oral BID  . cloNIDine  0.1 mg Oral TID  . clopidogrel  75 mg Oral Daily  . epoetin (EPOGEN/PROCRIT) injection  2,000 Units Subcutaneous Q T,Th,Sa-HD  . furosemide  80 mg Oral BID  . heparin  5,000 Units Subcutaneous Q8H  . insulin aspart  0-9 Units Subcutaneous TID WC  . multivitamin  1 tablet Oral QHS  . ofloxacin  1 drop Right Eye BID  .  prednisoLONE acetate  1 drop Right Eye Q2H while awake  . rosuvastatin  20 mg Oral Daily  . sodium chloride flush  3 mL Intravenous Q12H  . spironolactone  25 mg Oral BID  . tuberculin  5 Units Intradermal Once   Continuous Infusions:   LOS: 4 days    Time spent: 35 minutes    Domenic Polite, MD [facility] Pager [phone]  If 7PM-7AM, please contact night-coverage [email or web address] Password [facility] 06/23/2016, 11:58 AM

## 2016-06-23 NOTE — Care Management Important Message (Signed)
Important Message  Patient Details  Name: [name] MRN: [id number] Date of Birth: 11-26-1955   Medicare Important Message Given:  Yes    Orbie Pyo 06/23/2016, 2:54 PM

## 2016-06-23 NOTE — Consult Note (Signed)
Pt needs fistulogram possible PTA Discussed with Dr Augustin Coupe from Nephrology We will schedule with me Friday morning.  06/24/16  Ruta Hinds, MD [facility] Office: [phone] Pager: [phone]

## 2016-06-23 NOTE — Progress Notes (Signed)
Pt due for epoetin today at HD.  Informed HD nurse of med due today in HD and instructed to call Dr. Kathleene Hazel as pt is not on list today.  Notified Dr. Clover Mealy and instructed not give today.  Karie Kirks, Therapist, sports.

## 2016-06-23 NOTE — Consult Note (Signed)
Reason for Consult: ESRD Referring Physician: Dr. Ilene Qua  Chief Complaint: Shortness of breath  Assessment/Plan: 1. ESRD - no acute indication for dialysis today - There is an inflow lesion and the fistula is not augmenting properly which is why it was likely infiltrated yesterday. - He is no longer dyspneic and he should be able to tolerate an angiogram with angioplasty; ok to give contrast. - If the fistula does not augment better after PTA then he will need a TC. 2. Anemia - Iron stores are adequate and rx Procrit 4000 units on 3/20. 3. HTN  - Continue antiHTN regimen; plan on HD tomorrow and UF will help as well. 4. Renal osteodystrophy - repeat Phos and continue binders (phoslo 2 tabs TIDM) 5. CHF - compensated    HPI: ZAKARIYYA HELFMAN is an 61 y.o. male with a h/o of DM HTN advanced CKD followed by Dr. Carson Myrtle + PAD w/ B/L BKA's p/w shortness of breath over a few days + increased swelling + nonproductive cough. He denied CP or syncopal episodes. He was initiated on dialysis on 3/20 (2hr treatment) but on 3/21 he could not be accessed through the 61y/o lt BCF and was actually infiltrated per pt and on exam. Currently he denies any dysnpea and actually feels markedly improved as far as his respiratory status. His potassium and HCO3 are also in the normal range.  ROS Pertinent items are noted in HPI.  Chemistry and CBC: Creatinine, Ser  Date/Time Value Ref Range Status  06/22/2016 05:28 AM 5.01 (H) 0.61 - 1.24 mg/dL Final  06/21/2016 04:22 AM 6.27 (H) 0.61 - 1.24 mg/dL Final  06/20/2016 04:36 AM 6.15 (H) 0.61 - 1.24 mg/dL Final  06/19/2016 11:43 AM 6.02 (H) 0.61 - 1.24 mg/dL Final  06/18/2016 11:10 PM 5.98 (H) 0.61 - 1.24 mg/dL Final  03/13/2015 11:05 AM 4.53 (H) 0.61 - 1.24 mg/dL Final  08/06/2011 11:50 AM 3.46 (H) 0.50 - 1.35 mg/dL Final  11/25/2010 06:19 AM 2.41 (H) 0.50 - 1.35 mg/dL Final  11/24/2010 09:07 AM 2.00 (H) 0.50 - 1.35 mg/dL Final  11/22/2010 06:50 AM 2.07  (H) 0.50 - 1.35 mg/dL Final  11/18/2010 04:24 PM 2.89 (H) 0.50 - 1.35 mg/dL Final  11/18/2010 07:30 AM 3.01 (H) 0.50 - 1.35 mg/dL Final  11/14/2010 08:59 AM 2.73 (H) 0.50 - 1.35 mg/dL Final  11/06/2010 08:53 AM 2.35 (H) 0.50 - 1.35 mg/dL Final  11/05/2010 05:38 AM 2.74 (H) 0.50 - 1.35 mg/dL Final  11/04/2010 04:45 AM 2.86 (H) 0.50 - 1.35 mg/dL Final  10/29/2010 12:25 PM 1.95 (H) 0.50 - 1.35 mg/dL Final  10/13/2010 07:23 AM 2.86 (H) 0.50 - 1.35 mg/dL Final    Comment:    **Please note change in reference range.**  10/07/2010 03:20 PM 1.90 (H) 0.50 - 1.35 mg/dL Final  07/22/2010 08:08 AM 2.0 (H) 0.4 - 1.5 mg/dL Final  10/16/2009 10:17 PM 2.05 (H) 0.40 - 1.50 mg/dL Final    Comment:    See lab report for associated comment(s)  08/10/2009 1.28 mg/dL   11/10/2008 12:05 PM 1.40 0.4 - 1.5 mg/dL Final  11/06/2008 05:10 AM 1.40 0.4 - 1.5 mg/dL Final  11/02/2008 04:35 AM 1.19 0.4 - 1.5 mg/dL Final  10/31/2008 05:25 AM 0.95 0.4 - 1.5 mg/dL Final  06/24/2008 09:31 AM 1.3 0.4 - 1.5 mg/dL Final  10/29/2007 1.25 mg/dL     Recent Labs Lab 06/18/16 2310 06/19/16 1143 06/20/16 0436 06/21/16 0422 06/22/16 0528  NA 141  --  144 142  141  K 4.8  --  4.9 4.7 4.2  CL 108  --  110 106 103  CO2 23  --  '23 25 28  ' GLUCOSE 144*  --  118* 140* 134*  BUN 92*  --  123* 132* 97*  CREATININE 5.98* 6.02* 6.15* 6.27* 5.01*  CALCIUM 8.2*  --  8.5* 8.4* 8.4*  PHOS  --   --  6.8* 6.5*  --     Recent Labs Lab 06/18/16 2310 06/19/16 1143 06/20/16 0436 06/21/16 1249 06/22/16 1525  WBC 12.6* 8.1 8.1 5.6 7.5  NEUTROABS 11.0*  --   --   --  5.7  HGB 9.0* 8.2* 8.4* 8.3* 8.9*  HCT 28.6* 25.4* 26.8* 25.4* 27.5*  MCV 93.8 93.7 94.0 92.0 93.2  PLT 228 191 183 171 191   Liver Function Tests:  Recent Labs Lab 06/20/16 0436 06/21/16 0422  ALBUMIN 3.5 3.4*   No results for input(s): LIPASE, AMYLASE in the last 168 hours. No results for input(s): AMMONIA in the last 168 hours. Cardiac Enzymes:  Recent  Labs Lab 06/18/16 2310 06/19/16 0545 06/19/16 1143 06/19/16 1606  TROPONINI 0.03* 0.04* 0.04* 0.04*   Iron Studies:  Recent Labs  06/21/16 1250  IRON 34*  TIBC 358  FERRITIN 82   PT/INR: '@LABRCNTIP' (inr:5)  Xrays/Other Studies: ) Results for orders placed or performed during the hospital encounter of 06/18/16 (from the past 48 hour(s))  Glucose, capillary     Status: Abnormal   Collection Time: 06/21/16  7:21 AM  Result Value Ref Range   Glucose-Capillary 108 (H) 65 - 99 mg/dL  Glucose, capillary     Status: Abnormal   Collection Time: 06/21/16 11:39 AM  Result Value Ref Range   Glucose-Capillary 144 (H) 65 - 99 mg/dL   Comment 1 Notify RN   CBC     Status: Abnormal   Collection Time: 06/21/16 12:49 PM  Result Value Ref Range   WBC 5.6 4.0 - 10.5 K/uL   RBC 2.76 (L) 4.22 - 5.81 MIL/uL   Hemoglobin 8.3 (L) 13.0 - 17.0 g/dL   HCT 25.4 (L) 39.0 - 52.0 %   MCV 92.0 78.0 - 100.0 fL   MCH 30.1 26.0 - 34.0 pg   MCHC 32.7 30.0 - 36.0 g/dL   RDW 13.9 11.5 - 15.5 %   Platelets 171 150 - 400 K/uL  Hepatitis B surface antibody     Status: None   Collection Time: 06/21/16 12:49 PM  Result Value Ref Range   Hep B S Ab Non Reactive     Comment: (NOTE)              Non Reactive: Inconsistent with immunity,                            less than 10 mIU/mL              Reactive:     Consistent with immunity,                            greater than 9.9 mIU/mL Performed At: [facility] [place], [place] [id number] Lindon Romp MD [id number]   Hepatitis B core antibody, total     Status: None   Collection Time: 06/21/16 12:49 PM  Result Value Ref Range   Hep B Core Total Ab Negative Negative    Comment: (NOTE)  Performed At: [facility] [place], [place] [id number] Lindon Romp MD [id number]   Hepatitis C antibody     Status: None   Collection Time: 06/21/16 12:49 PM  Result Value Ref Range   HCV Ab <0.1 0.0 - 0.9  s/co ratio    Comment: (NOTE)                                  Negative:     < 0.8                             Indeterminate: 0.8 - 0.9                                  Positive:     > 0.9 The CDC recommends that a positive HCV antibody result be followed up with a HCV Nucleic Acid Amplification test ([id number]). Performed At: [facility] [place], [place] [id number] Lindon Romp MD [id number]   Ferritin     Status: None   Collection Time: 06/21/16 12:50 PM  Result Value Ref Range   Ferritin 82 24 - 336 ng/mL    Comment: Performed at [facility], [place] [address], [place], [place] [address]  Iron and TIBC     Status: Abnormal   Collection Time: 06/21/16 12:50 PM  Result Value Ref Range   Iron 34 (L) 45 - 182 ug/dL   TIBC 358 250 - 450 ug/dL   Saturation Ratios 9 (L) 17.9 - 39.5 %   UIBC 324 ug/dL    Comment: Performed at [facility], [place] [address], [place], [place] [address]  Hepatitis B surface antigen     Status: None   Collection Time: 06/21/16 12:50 PM  Result Value Ref Range   Hepatitis B Surface Ag Negative Negative    Comment: (NOTE) Performed At: [facility] [address] [place], [place] [id number] Lindon Romp MD [id number]   Glucose, capillary     Status: Abnormal   Collection Time: 06/21/16  4:26 PM  Result Value Ref Range   Glucose-Capillary 158 (H) 65 - 99 mg/dL   Comment 1 Notify RN   Glucose, capillary     Status: Abnormal   Collection Time: 06/21/16  8:44 PM  Result Value Ref Range   Glucose-Capillary 208 (H) 65 - 99 mg/dL  Glucose, capillary     Status: Abnormal   Collection Time: 06/22/16  5:00 AM  Result Value Ref Range   Glucose-Capillary 128 (H) 65 - 99 mg/dL  Basic metabolic panel     Status: Abnormal   Collection Time: 06/22/16  5:28 AM  Result Value Ref Range   Sodium 141 135 - 145 mmol/L   Potassium 4.2 3.5 - 5.1 mmol/L   Chloride 103 101 - 111 mmol/L   CO2 28 22 - 32 mmol/L    Glucose, Bld 134 (H) 65 - 99 mg/dL   BUN 97 (H) 6 - 20 mg/dL   Creatinine, Ser 5.01 (H) 0.61 - 1.24 mg/dL   Calcium 8.4 (L) 8.9 - 10.3 mg/dL   GFR calc non Af Amer 11 (L) >60 mL/min   GFR calc Af Amer 13 (L) >60 mL/min    Comment: (NOTE) The eGFR has been calculated using the  CKD EPI equation. This calculation has not been validated in all clinical situations. eGFR's persistently <60 mL/min signify possible Chronic Kidney Disease.    Anion gap 10 5 - 15  Glucose, capillary     Status: Abnormal   Collection Time: 06/22/16  7:41 AM  Result Value Ref Range   Glucose-Capillary 114 (H) 65 - 99 mg/dL  Glucose, capillary     Status: Abnormal   Collection Time: 06/22/16 11:37 AM  Result Value Ref Range   Glucose-Capillary 149 (H) 65 - 99 mg/dL  CBC with Differential/Platelet     Status: Abnormal   Collection Time: 06/22/16  3:25 PM  Result Value Ref Range   WBC 7.5 4.0 - 10.5 K/uL   RBC 2.95 (L) 4.22 - 5.81 MIL/uL   Hemoglobin 8.9 (L) 13.0 - 17.0 g/dL   HCT 27.5 (L) 39.0 - 52.0 %   MCV 93.2 78.0 - 100.0 fL   MCH 30.2 26.0 - 34.0 pg   MCHC 32.4 30.0 - 36.0 g/dL   RDW 13.9 11.5 - 15.5 %   Platelets 191 150 - 400 K/uL   Neutrophils Relative % 76 %   Neutro Abs 5.7 1.7 - 7.7 K/uL   Lymphocytes Relative 14 %   Lymphs Abs 1.1 0.7 - 4.0 K/uL   Monocytes Relative 6 %   Monocytes Absolute 0.5 0.1 - 1.0 K/uL   Eosinophils Relative 4 %   Eosinophils Absolute 0.3 0.0 - 0.7 K/uL   Basophils Relative 0 %   Basophils Absolute 0.0 0.0 - 0.1 K/uL  Glucose, capillary     Status: Abnormal   Collection Time: 06/22/16  6:31 PM  Result Value Ref Range   Glucose-Capillary 210 (H) 65 - 99 mg/dL   Comment 1 Notify RN    Comment 2 Document in Chart   Glucose, capillary     Status: Abnormal   Collection Time: 06/22/16  9:01 PM  Result Value Ref Range   Glucose-Capillary 185 (H) 65 - 99 mg/dL   Comment 1 Notify RN    Comment 2 Document in Chart    Dg Chest 1 View  Result Date:  06/21/2016 CLINICAL DATA:  Congestive heart failure EXAM: CHEST 1 VIEW COMPARISON:  06/18/2016 FINDINGS: Cardiomegaly again noted. Single lead cardiac pacemaker is unchanged in position. Improvement in aeration. Residual mild perihilar interstitial prominence without convincing pulmonary edema. Mild left basilar atelectasis. Stable trace bilateral pleural effusion. IMPRESSION: Improvement in aeration. Residual mild perihilar interstitial prominence without convincing pulmonary edema. Mild left basilar atelectasis. Trace bilateral pleural effusion again noted Electronically Signed   By: Lahoma Crocker M.D.   On: 06/21/2016 09:15    PMH:   Past Medical History:  Diagnosis Date  . AICD (automatic cardioverter/defibrillator) present   . Amputated below knee (HCC)    Right  . Anemia   . Arthritis   . Cerebrovascular disease   . CHF (congestive heart failure) ([place])   . Chronic kidney disease    Stage IV  . Diabetes mellitus    type II  . History of TIAs   . Hyperlipidemia   . Hypertension   . Nonischemic cardiomyopathy ([place])   . PVD (peripheral vascular disease) ([place])   . Shortness of breath dyspnea    with exertion   . Skin disease    Rare  . Stroke [facility])    "light stroke"  . Tobacco abuse     PSH:   Past Surgical History:  Procedure Laterality Date  . AV FISTULA  PLACEMENT Left 04/08/2015   Procedure: Creation of Left arm BRACHIOCEPHALIC ARTERIOVENOUS  FISTULA ;  Surgeon: Mal Misty, MD;  Location: [facility];  Service: Vascular;  Laterality: Left;  . CARDIAC DEFIBRILLATOR PLACEMENT    . CARDIAC DEFIBRILLATOR PLACEMENT    . CATARACT EXTRACTION W/PHACO Right 03/17/2015   Procedure: CATARACT EXTRACTION PHACO AND INTRAOCULAR LENS PLACEMENT (IOC);  Surgeon: Rutherford Guys, MD;  Location: [facility];  Service: Ophthalmology;  Laterality: Right;  CDE: 6.59  . EYE SURGERY Right    Cataract  . LEG AMPUTATION BELOW KNEE     bilateral    Allergies:  Allergies  Allergen Reactions  . Acetazolamide  Other (See Comments)    Jittery odd feeling. (hyper feeling)  . Contrast Media [Iodinated Diagnostic Agents] Other (See Comments)    unknown  . Other Other (See Comments)    Transfer Dye---"Makes me tired"    Medications:   Prior to Admission medications   Medication Sig Start Date End Date Taking? Authorizing Provider  acetaminophen (TYLENOL) 500 MG tablet Take 1,000 mg by mouth daily as needed for mild pain or headache.    Yes Historical Provider, MD  amLODipine (NORVASC) 10 MG tablet Take 10 mg by mouth daily.     Yes Historical Provider, MD  aspirin 81 MG tablet Take 81 mg by mouth daily.     Yes Historical Provider, MD  brimonidine-timolol (COMBIGAN) 0.2-0.5 % ophthalmic solution Place 1 drop into the right eye every 12 (twelve) hours.   Yes Historical Provider, MD  calcium acetate (PHOSLO) 667 MG capsule Take 667 mg by mouth daily. With largest meal of the day 03/25/15  Yes Historical Provider, MD  carvedilol (COREG) 25 MG tablet Take 1 tablet (25 mg total) by mouth 2 (two) times daily. 03/02/11  Yes Evans Lance, MD  cloNIDine (CATAPRES) 0.1 MG tablet take 1 tablet by mouth twice a day Patient taking differently: take 1 tablet by mouth three times a day 06/13/11  Yes Evans Lance, MD  clopidogrel (PLAVIX) 75 MG tablet Take 75 mg by mouth daily.     Yes Historical Provider, MD  fenofibrate 160 MG tablet Take 160 mg by mouth daily. 03/09/15  Yes Historical Provider, MD  folic acid (FOLVITE) 1 MG tablet Take 1 mg by mouth daily.   Yes Historical Provider, MD  furosemide (LASIX) 20 MG tablet Take 80 mg by mouth 2 (two) times daily.    Yes Historical Provider, MD  HUMALOG KWIKPEN 100 UNIT/ML KiwkPen Inject 10 Units into the skin 3 (three) times daily.  04/01/15  Yes Historical Provider, MD  Insulin Glargine (LANTUS SOLOSTAR [place]) Inject 50 Units into the skin at bedtime.    Yes Historical Provider, MD  ofloxacin (OCUFLOX) 0.3 % ophthalmic solution Place 1 drop into the right eye 2 (two)  times daily.   Yes Historical Provider, MD  oxyCODONE (OXY IR/ROXICODONE) 5 MG immediate release tablet Take 1 tablet (5 mg total) by mouth at bedtime as needed for moderate pain. 04/08/15  Yes Ulyses Amor, PA-C  prednisoLONE acetate (PRED FORTE) 1 % ophthalmic suspension Place 1 drop into the right eye every 2 (two) hours while awake.   Yes Historical Provider, MD  rosuvastatin (CRESTOR) 20 MG tablet Take 20 mg by mouth daily. 02/24/15  Yes Historical Provider, MD  spironolactone (ALDACTONE) 25 MG tablet Take 25 mg by mouth 2 (two) times daily.    Historical Provider, MD  traMADol (ULTRAM) 50 MG tablet Take 1 tablet (50  mg total) by mouth every 6 (six) hours as needed. 05/17/15   Dorie Rank, MD  Vitamin D, Ergocalciferol, (DRISDOL) 50000 UNITS CAPS capsule Take 50,000 Units by mouth once a week. 02/12/15   Historical Provider, MD    Discontinued Meds:   Medications Discontinued During This Encounter  Medication Reason  . nitroGLYCERIN 50 mg in dextrose 5 % 250 mL (0.2 mg/mL) infusion   . furosemide (LASIX) injection 80 mg     Social History:  reports that he quit smoking about 18 years ago. He has a 20.00 pack-year smoking history. He has never used smokeless tobacco. He reports that he does not drink alcohol or use drugs.  Family History:   Family History  Problem Relation Age of Onset  . Diabetes Father   . Stroke Father   . Diabetes Brother   . Diabetes Brother   . Diabetes Brother   . Diabetes Brother   . Heart failure Mother   . Diabetes Mother   . Diabetes Other   . Coronary artery disease Other     Blood pressure (!) 152/72, pulse 74, temperature 98.8 F (37.1 C), temperature source Oral, resp. rate 20, height 6' (1.829 m), weight 118.4 kg (261 lb 0.4 oz), SpO2 93 %. General appearance: alert, cooperative and appears stated age Head: Normocephalic, without obvious abnormality, atraumatic Eyes: negative Neck: no adenopathy, no carotid bruit, no JVD, supple, symmetrical,  trachea midline and thyroid not enlarged, symmetric, no tenderness/mass/nodules Back: symmetric, no curvature. ROM normal. No CVA tenderness. Resp: clear to auscultation bilaterally Chest wall: no tenderness Cardio: regular rate and rhythm, S1, S2 normal, no murmur, click, rub or gallop GI: soft, non-tender; bowel sounds normal; no masses,  no organomegaly Extremities: B/L BKA well healed stumps Pulses: 2+ and symmetric Skin: Skin color, texture, turgor normal. No rashes or lesions Lymph nodes: Cervical, supraclavicular, and axillary nodes normal. Neurologic: Grossly normal  Access: Lt BCF w/ strong bruit w/ mild infiltration w/ poor augmentation       Genieve Ramaswamy, Hunt Oris, MD 06/23/2016, 5:13 AM

## 2016-06-23 NOTE — Clinical Social Work Note (Signed)
LCSW spoke with Dayton Scrape, LCSWA, and advised that patient had been assessed and his new dialysis intake packet had been sent to [facility] central intake. Judson Roch advised that she would provide patient's case manager with this information for follow up.     LCSW signing off.    Lydon Vansickle, Clydene Pugh, LCSW

## 2016-06-24 ENCOUNTER — Encounter (HOSPITAL_COMMUNITY)
Admission: EM | Disposition: A | Discharge status: Home / Self Care | Payer: Self-pay | Source: Home / Self Care | Attending: Internal Medicine

## 2016-06-24 ENCOUNTER — Encounter (HOSPITAL_COMMUNITY): Payer: Self-pay | Admitting: Vascular Surgery

## 2016-06-24 DIAGNOSIS — T82898A Other specified complication of vascular prosthetic devices, implants and grafts, initial encounter: Secondary | ICD-10-CM

## 2016-06-24 HISTORY — PX: A/V FISTULAGRAM: CATH118298

## 2016-06-24 LAB — BASIC METABOLIC PANEL
ANION GAP: 12 (ref 5–15)
BUN: 101 mg/dL — ABNORMAL HIGH (ref 6–20)
CALCIUM: 8.4 mg/dL — AB (ref 8.9–10.3)
CHLORIDE: 102 mmol/L (ref 101–111)
CO2: 28 mmol/L (ref 22–32)
Creatinine, Ser: 5.44 mg/dL — ABNORMAL HIGH (ref 0.61–1.24)
GFR calc non Af Amer: 10 mL/min — ABNORMAL LOW (ref >60)
GFR, EST AFRICAN AMERICAN: 12 mL/min — AB (ref >60)
Glucose, Bld: 184 mg/dL — ABNORMAL HIGH (ref 65–99)
Potassium: 4.3 mmol/L (ref 3.5–5.1)
SODIUM: 142 mmol/L (ref 135–145)

## 2016-06-24 LAB — CBC
HCT: 25.9 % — ABNORMAL LOW (ref 39.0–52.0)
HEMOGLOBIN: 8.1 g/dL — AB (ref 13.0–17.0)
MCH: 28.8 pg (ref 26.0–34.0)
MCHC: 31.3 g/dL (ref 30.0–36.0)
MCV: 92.2 fL (ref 78.0–100.0)
Platelets: 177 10*3/uL (ref 150–400)
RBC: 2.81 MIL/uL — AB (ref 4.22–5.81)
RDW: 13.7 % (ref 11.5–15.5)
WBC: 6.8 10*3/uL (ref 4.0–10.5)

## 2016-06-24 LAB — GLUCOSE, CAPILLARY
GLUCOSE-CAPILLARY: 124 mg/dL — AB (ref 65–99)
GLUCOSE-CAPILLARY: 141 mg/dL — AB (ref 65–99)
Glucose-Capillary: 121 mg/dL — ABNORMAL HIGH (ref 65–99)
Glucose-Capillary: 215 mg/dL — ABNORMAL HIGH (ref 65–99)

## 2016-06-24 LAB — PROTIME-INR
INR: 1.22
Prothrombin Time: 15.5 seconds — ABNORMAL HIGH (ref 11.4–15.2)

## 2016-06-24 SURGERY — A/V FISTULAGRAM
Anesthesia: LOCAL | Laterality: Left

## 2016-06-24 MED ORDER — IODIXANOL 320 MG/ML IV SOLN
INTRAVENOUS | Status: DC | PRN
  Administered 2016-06-24: 65 mL via INTRAVENOUS

## 2016-06-24 MED ORDER — HEPARIN SODIUM (PORCINE) 1000 UNIT/ML IJ SOLN
INTRAMUSCULAR | Status: AC
  Filled 2016-06-24: qty 1

## 2016-06-24 MED ORDER — HYDROCERIN EX CREA
TOPICAL_CREAM | Freq: Two times a day (BID) | CUTANEOUS | Status: DC
  Administered 2016-06-24 – 2016-06-29 (×6): via TOPICAL
  Filled 2016-06-24: qty 113

## 2016-06-24 MED ORDER — HEPARIN SODIUM (PORCINE) 1000 UNIT/ML IJ SOLN
INTRAMUSCULAR | Status: DC | PRN
  Administered 2016-06-24: 3000 [IU] via INTRAVENOUS

## 2016-06-24 MED ORDER — DARBEPOETIN ALFA 25 MCG/0.42ML IJ SOSY: 25.0000 ug | PREFILLED_SYRINGE | INTRAMUSCULAR | Status: DC

## 2016-06-24 MED ORDER — DARBEPOETIN ALFA 150 MCG/0.3ML IJ SOSY
150.0000 ug | PREFILLED_SYRINGE | INTRAMUSCULAR | Status: DC
  Administered 2016-06-25: 150 ug via INTRAVENOUS

## 2016-06-24 MED ORDER — LIDOCAINE HCL (PF) 1 % IJ SOLN
INTRAMUSCULAR | Status: DC | PRN
  Administered 2016-06-24: 5 mL via INTRADERMAL

## 2016-06-24 MED ORDER — SODIUM CHLORIDE 0.9 % IV SOLN
510.0000 mg | Freq: Once | INTRAVENOUS | Status: DC
  Filled 2016-06-24: qty 17

## 2016-06-24 MED ORDER — LIDOCAINE HCL (PF) 1 % IJ SOLN
INTRAMUSCULAR | Status: AC
  Filled 2016-06-24: qty 30

## 2016-06-24 MED ORDER — SODIUM CHLORIDE 0.9 % IV SOLN
125.0000 mg | Freq: Every day | INTRAVENOUS | Status: DC
  Administered 2016-06-24 – 2016-06-29 (×6): 125 mg via INTRAVENOUS
  Filled 2016-06-24 (×12): qty 10

## 2016-06-24 SURGICAL SUPPLY — 19 items
BAG SNAP BAND KOVER 36X36 (MISCELLANEOUS) ×2 IMPLANT
BALLN MUSTANG 4.0X40 75 (BALLOONS) ×2
BALLN MUSTANG 6.0X40 75 (BALLOONS) ×2
BALLN MUSTANG 8X20X75 (BALLOONS) ×2
BALLOON MUSTANG 4.0X40 75 (BALLOONS) IMPLANT
BALLOON MUSTANG 6.0X40 75 (BALLOONS) IMPLANT
BALLOON MUSTANG 8X20X75 (BALLOONS) IMPLANT
COVER DOME SNAP 22 D (MISCELLANEOUS) ×2 IMPLANT
COVER PRB 48X5XTLSCP FOLD TPE (BAG) ×1 IMPLANT
COVER PROBE 5X48 (BAG) ×2
KIT ENCORE 26 ADVANTAGE (KITS) ×1 IMPLANT
PROTECTION STATION PRESSURIZED (MISCELLANEOUS) ×2
SHEATH PINNACLE R/O II 6F 4CM (SHEATH) ×1 IMPLANT
STATION PROTECTION PRESSURIZED (MISCELLANEOUS) ×1 IMPLANT
STOPCOCK MORSE 400PSI 3WAY (MISCELLANEOUS) ×2 IMPLANT
TRAY PV CATH (CUSTOM PROCEDURE TRAY) ×2 IMPLANT
TUBING CIL FLEX 10 FLL-RA (TUBING) ×2 IMPLANT
WIRE BENTSON .035X145CM (WIRE) ×1 IMPLANT
WIRE MINI STICK MAX (SHEATH) ×2 IMPLANT

## 2016-06-24 NOTE — Interval H&P Note (Signed)
History and Physical Interval Note:  06/24/2016 10:25 AM  [name]  has presented today for surgery, with the diagnosis of instage renal  The various methods of treatment have been discussed with the patient and family. After consideration of risks, benefits and other options for treatment, the patient has consented to  Procedure(s): A/V Fistulagram (Left) as a surgical intervention .  The patient's history has been reviewed, patient examined, no change in status, stable for surgery.  I have reviewed the patient's chart and labs.  Questions were answered to the patient's satisfaction.     Ruta Hinds

## 2016-06-24 NOTE — Progress Notes (Signed)
PROGRESS NOTE    [name]  RFF:[id number] DOB: [birth year] DOA: 06/18/2016 PCP: [name], MD    Brief Narrative:  61 year old man PMH diabetes mellitus, bilateral BKA, AICD, CHF-EF 20%, C KD with AV fistula placed but not on dialysis, presented with increasing swelling, weight gain and shortness of breath. Admitted for hypertensive urgency, CHF exacerbation, volume overload, acute hypoxic respiratory failure. Has subjectively improved with diuresis and acute hypoxia has resolved although renal function continues to worsen. Nephrology following. Patient was started on HD on 3/20.  On 3/21 his dialysis was stopped secondary to infiltration and when needle was withdrawn on both dialysis attempts clots were expressed.  He was transferred to [facility] for evaluation by vascular surgery and nephrology.   Assessment & Plan:   Acute hypoxic respiratory failure secondary to VOlume overload CKD5 - improved, following first HD on 3/20 -repeat HD pending access, Renal following, vol status stable at this time  AKI on CKD4 /New ESRD -superimposed on chronic kidney disease stage IV - Started on HD on 3/21 - Patient fistula clotted and infiltrated on 3/21 - appreciate VVS consult, plan for Fistulogram/PTA today  Anemia of chronic disease -on Epo an Iron per Renal  NICM/Cardiomyopathy previously EF 15-20%. Repeat ECHO with EF of 35-40% -EF 35-40%, volume managed with HD now -s/p AICD, lost to follow up with [facility]  Hypertensive urgency, accelerated hypertension - resolved. - Continue amlodipine, Coreg, clonidine, stop Aldactone - will need dose adjustment when HD resumed  Diabetes mellitus - Remains stable. Continue sliding scale insulin. -fu Hba1c, unclear if he's taking lantus any longer  Peripheral vascular disease,  -status post bilateral BKA.  DVT prophylaxis: heparin Code Status: full code Family Communication: none Disposition Plan: home when improved    Consultants:     Nephrology  CM  Procedures:   Dialysis  Antimicrobials:   none    Subjective: Feels ok, breathing no distress  Objective: Vitals:   06/23/16 1609 06/23/16 2026 06/24/16 0528 06/24/16 0827  BP: (!) 167/71 (!) 167/71 (!) 149/52 (!) 158/69  Pulse: 68 69 73 68  Resp:  20 18 20   Temp:  98 F (36.7 C) 98.9 F (37.2 C) 98.3 F (36.8 C)  TempSrc:  Oral Oral Oral  SpO2:  99% 100% 100%  Weight:   112.1 kg (247 lb 3.2 oz)   Height:        Intake/Output Summary (Last 24 hours) at 06/24/16 1140 Last data filed at 06/24/16 0930  Gross per 24 hour  Intake              480 ml  Output             2200 ml  Net            -1720 ml   Filed Weights   06/22/16 1000 06/23/16 0523 06/24/16 0528  Weight: 118.4 kg (261 lb 0.4 oz) 114.4 kg (252 lb 3.2 oz) 112.1 kg (247 lb 3.2 oz)    Examination:  General exam: Appears calm and comfortable, no distress, pleasant Respiratory system: Respiratory effort normal. No wheezing, rhonchi or rales Cardiovascular system: S1 & S2 heard, RRR. No JVD, murmurs, rubs, gallops or clicks. No pedal edema. Gastrointestinal system: Abdomen is nondistended, soft and nontender. No organomegaly or masses felt. Normal bowel sounds heard. Central nervous system: Alert and oriented. No focal neurological deficits. Extremities: bilateral BKA's, LUE with AVF Skin: scattered scaling erythema rash on upper extremities and lower extremities bilaterally Psychiatry: Judgement and  insight appear normal. Mood & affect appropriate.     Data Reviewed: I have personally reviewed following labs and imaging studies  CBC:  Recent Labs Lab 06/18/16 2310 06/19/16 1143 06/20/16 0436 06/21/16 1249 06/22/16 1525 06/24/16 0257  WBC 12.6* 8.1 8.1 5.6 7.5 6.8  NEUTROABS 11.0*  --   --   --  5.7  --   HGB 9.0* 8.2* 8.4* 8.3* 8.9* 8.1*  HCT 28.6* 25.4* 26.8* 25.4* 27.5* 25.9*  MCV 93.8 93.7 94.0 92.0 93.2 92.2  PLT 228 191 183 171 191 147   Basic Metabolic  Panel:  Recent Labs Lab 06/18/16 2310 06/19/16 1143 06/20/16 0436 06/21/16 0422 06/22/16 0528 06/23/16 0711 06/24/16 0257  NA 141  --  144 142 141  --  142  K 4.8  --  4.9 4.7 4.2  --  4.3  CL 108  --  110 106 103  --  102  CO2 23  --  23 25 28   --  28  GLUCOSE 144*  --  118* 140* 134*  --  184*  BUN 92*  --  123* 132* 97*  --  101*  CREATININE 5.98* 6.02* 6.15* 6.27* 5.01*  --  5.44*  CALCIUM 8.2*  --  8.5* 8.4* 8.4*  --  8.4*  PHOS  --   --  6.8* 6.5*  --  4.7*  --    GFR: Estimated Creatinine Clearance: 18 mL/min (A) (by C-G formula based on SCr of 5.44 mg/dL (H)). Liver Function Tests:  Recent Labs Lab 06/20/16 0436 06/21/16 0422  ALBUMIN 3.5 3.4*   No results for input(s): LIPASE, AMYLASE in the last 168 hours. No results for input(s): AMMONIA in the last 168 hours. Coagulation Profile:  Recent Labs Lab 06/18/16 2310 06/24/16 0257  INR 1.30 1.22   Cardiac Enzymes:  Recent Labs Lab 06/18/16 2310 06/19/16 0545 06/19/16 1143 06/19/16 1606  TROPONINI 0.03* 0.04* 0.04* 0.04*   BNP (last 3 results) No results for input(s): PROBNP in the last 8760 hours. HbA1C: No results for input(s): HGBA1C in the last 72 hours. CBG:  Recent Labs Lab 06/23/16 0821 06/23/16 1230 06/23/16 1642 06/23/16 2125 06/24/16 0825  GLUCAP 115* 142* 194* 166* 124*   Lipid Profile: No results for input(s): CHOL, HDL, LDLCALC, TRIG, CHOLHDL, LDLDIRECT in the last 72 hours. Thyroid Function Tests: No results for input(s): TSH, T4TOTAL, FREET4, T3FREE, THYROIDAB in the last 72 hours. Anemia Panel:  Recent Labs  06/21/16 1250  FERRITIN 82  TIBC 358  IRON 34*   Sepsis Labs: No results for input(s): PROCALCITON, LATICACIDVEN in the last 168 hours.  Recent Results (from the past 240 hour(s))  MRSA PCR Screening     Status: None   Collection Time: 06/19/16  4:31 AM  Result Value Ref Range Status   MRSA by PCR NEGATIVE NEGATIVE Final    Comment:        The GeneXpert  MRSA Assay (FDA approved for NASAL specimens only), is one component of a comprehensive MRSA colonization surveillance program. It is not intended to diagnose MRSA infection nor to guide or monitor treatment for MRSA infections.          Radiology Studies: No results found.      Scheduled Meds: . [MAR Hold] amLODipine  10 mg Oral Daily  . [MAR Hold] aspirin EC  81 mg Oral Daily  . [MAR Hold] calcium acetate  667 mg Oral BID WC  . [MAR Hold] carvedilol  25 mg Oral  BID  . [MAR Hold] cloNIDine  0.1 mg Oral TID  . [MAR Hold] clopidogrel  75 mg Oral Daily  . [MAR Hold] epoetin (EPOGEN/PROCRIT) injection  2,000 Units Subcutaneous Q T,Th,Sa-HD  . [MAR Hold] furosemide  80 mg Oral BID  . [MAR Hold] heparin  5,000 Units Subcutaneous Q8H  . [MAR Hold] insulin aspart  0-9 Units Subcutaneous TID WC  . [MAR Hold] multivitamin  1 tablet Oral QHS  . [MAR Hold] ofloxacin  1 drop Right Eye BID  . [MAR Hold] prednisoLONE acetate  1 drop Right Eye Q2H while awake  . [MAR Hold] rosuvastatin  20 mg Oral Daily  . [MAR Hold] sodium chloride flush  3 mL Intravenous Q12H   Continuous Infusions:   LOS: 5 days    Time spent: 35 minutes    Domenic Polite, MD [facility] Pager [phone]  If 7PM-7AM, please contact night-coverage [email or web address] Password [email or web address] 06/24/2016, 11:40 AM

## 2016-06-24 NOTE — Progress Notes (Signed)
Pt is alert and oriented with no distress blood pressure left arm fissula dry intact. elevated gave schedule clonidine.plan to have H/D in am  Need Equipment. Rec Pt.

## 2016-06-24 NOTE — H&P (View-Only) (Signed)
Reason for Consult: ESRD Referring Physician: Dr. Ilene Qua  Chief Complaint: Shortness of breath  Assessment/Plan: 1. ESRD - no acute indication for dialysis today - There is an inflow lesion and the fistula is not augmenting properly which is why it was likely infiltrated yesterday. - He is no longer dyspneic and he should be able to tolerate an angiogram with angioplasty; ok to give contrast. - If the fistula does not augment better after PTA then he will need a TC. 2. Anemia - Iron stores are adequate and rx Procrit 4000 units on 3/20. 3. HTN  - Continue antiHTN regimen; plan on HD tomorrow and UF will help as well. 4. Renal osteodystrophy - repeat Phos and continue binders (phoslo 2 tabs TIDM) 5. CHF - compensated    HPI: [name] is an 61 y.o. male with a h/o of DM HTN advanced CKD followed by Dr. Carson Myrtle + PAD w/ B/L BKA's p/w shortness of breath over a few days + increased swelling + nonproductive cough. He denied CP or syncopal episodes. He was initiated on dialysis on 3/20 (2hr treatment) but on 3/21 he could not be accessed through the 61y/o lt BCF and was actually infiltrated per pt and on exam. Currently he denies any dysnpea and actually feels markedly improved as far as his respiratory status. His potassium and HCO3 are also in the normal range.  ROS Pertinent items are noted in HPI.  Chemistry and CBC: Creatinine, Ser  Date/Time Value Ref Range Status  06/22/2016 05:28 AM 5.01 (H) 0.61 - 1.24 mg/dL Final  06/21/2016 04:22 AM 6.27 (H) 0.61 - 1.24 mg/dL Final  06/20/2016 04:36 AM 6.15 (H) 0.61 - 1.24 mg/dL Final  06/19/2016 11:43 AM 6.02 (H) 0.61 - 1.24 mg/dL Final  06/18/2016 11:10 PM 5.98 (H) 0.61 - 1.24 mg/dL Final  03/13/2015 11:05 AM 4.53 (H) 0.61 - 1.24 mg/dL Final  08/06/2011 11:50 AM 3.46 (H) 0.50 - 1.35 mg/dL Final  11/25/2010 06:19 AM 2.41 (H) 0.50 - 1.35 mg/dL Final  11/24/2010 09:07 AM 2.00 (H) 0.50 - 1.35 mg/dL Final  11/22/2010 06:50 AM 2.07  (H) 0.50 - 1.35 mg/dL Final  11/18/2010 04:24 PM 2.89 (H) 0.50 - 1.35 mg/dL Final  11/18/2010 07:30 AM 3.01 (H) 0.50 - 1.35 mg/dL Final  11/14/2010 08:59 AM 2.73 (H) 0.50 - 1.35 mg/dL Final  11/06/2010 08:53 AM 2.35 (H) 0.50 - 1.35 mg/dL Final  11/05/2010 05:38 AM 2.74 (H) 0.50 - 1.35 mg/dL Final  11/04/2010 04:45 AM 2.86 (H) 0.50 - 1.35 mg/dL Final  10/29/2010 12:25 PM 1.95 (H) 0.50 - 1.35 mg/dL Final  10/13/2010 07:23 AM 2.86 (H) 0.50 - 1.35 mg/dL Final    Comment:    **Please note change in reference range.**  10/07/2010 03:20 PM 1.90 (H) 0.50 - 1.35 mg/dL Final  07/22/2010 08:08 AM 2.0 (H) 0.4 - 1.5 mg/dL Final  10/16/2009 10:17 PM 2.05 (H) 0.40 - 1.50 mg/dL Final    Comment:    See lab report for associated comment(s)  08/10/2009 1.28 mg/dL   11/10/2008 12:05 PM 1.40 0.4 - 1.5 mg/dL Final  11/06/2008 05:10 AM 1.40 0.4 - 1.5 mg/dL Final  11/02/2008 04:35 AM 1.19 0.4 - 1.5 mg/dL Final  10/31/2008 05:25 AM 0.95 0.4 - 1.5 mg/dL Final  06/24/2008 09:31 AM 1.3 0.4 - 1.5 mg/dL Final  10/29/2007 1.25 mg/dL     Recent Labs Lab 06/18/16 2310 06/19/16 1143 06/20/16 0436 06/21/16 0422 06/22/16 0528  NA 141  --  144 142  141  K 4.8  --  4.9 4.7 4.2  CL 108  --  110 106 103  CO2 23  --  '23 25 28  ' GLUCOSE 144*  --  118* 140* 134*  BUN 92*  --  123* 132* 97*  CREATININE 5.98* 6.02* 6.15* 6.27* 5.01*  CALCIUM 8.2*  --  8.5* 8.4* 8.4*  PHOS  --   --  6.8* 6.5*  --     Recent Labs Lab 06/18/16 2310 06/19/16 1143 06/20/16 0436 06/21/16 1249 06/22/16 1525  WBC 12.6* 8.1 8.1 5.6 7.5  NEUTROABS 11.0*  --   --   --  5.7  HGB 9.0* 8.2* 8.4* 8.3* 8.9*  HCT 28.6* 25.4* 26.8* 25.4* 27.5*  MCV 93.8 93.7 94.0 92.0 93.2  PLT 228 191 183 171 191   Liver Function Tests:  Recent Labs Lab 06/20/16 0436 06/21/16 0422  ALBUMIN 3.5 3.4*   No results for input(s): LIPASE, AMYLASE in the last 168 hours. No results for input(s): AMMONIA in the last 168 hours. Cardiac Enzymes:  Recent  Labs Lab 06/18/16 2310 06/19/16 0545 06/19/16 1143 06/19/16 1606  TROPONINI 0.03* 0.04* 0.04* 0.04*   Iron Studies:  Recent Labs  06/21/16 1250  IRON 34*  TIBC 358  FERRITIN 82   PT/INR: '@LABRCNTIP' (inr:5)  Xrays/Other Studies: ) Results for orders placed or performed during the hospital encounter of 06/18/16 (from the past 48 hour(s))  Glucose, capillary     Status: Abnormal   Collection Time: 06/21/16  7:21 AM  Result Value Ref Range   Glucose-Capillary 108 (H) 65 - 99 mg/dL  Glucose, capillary     Status: Abnormal   Collection Time: 06/21/16 11:39 AM  Result Value Ref Range   Glucose-Capillary 144 (H) 65 - 99 mg/dL   Comment 1 Notify RN   CBC     Status: Abnormal   Collection Time: 06/21/16 12:49 PM  Result Value Ref Range   WBC 5.6 4.0 - 10.5 K/uL   RBC 2.76 (L) 4.22 - 5.81 MIL/uL   Hemoglobin 8.3 (L) 13.0 - 17.0 g/dL   HCT 25.4 (L) 39.0 - 52.0 %   MCV 92.0 78.0 - 100.0 fL   MCH 30.1 26.0 - 34.0 pg   MCHC 32.7 30.0 - 36.0 g/dL   RDW 13.9 11.5 - 15.5 %   Platelets 171 150 - 400 K/uL  Hepatitis B surface antibody     Status: None   Collection Time: 06/21/16 12:49 PM  Result Value Ref Range   Hep B S Ab Non Reactive     Comment: (NOTE)              Non Reactive: Inconsistent with immunity,                            less than 10 mIU/mL              Reactive:     Consistent with immunity,                            greater than 9.9 mIU/mL Performed At: [facility] [place], [place] [id number] Lindon Romp MD [id number]   Hepatitis B core antibody, total     Status: None   Collection Time: 06/21/16 12:49 PM  Result Value Ref Range   Hep B Core Total Ab Negative Negative    Comment: (NOTE)  Performed At: [facility] [place], [place] [id number] Lindon Romp MD [id number]   Hepatitis C antibody     Status: None   Collection Time: 06/21/16 12:49 PM  Result Value Ref Range   HCV Ab <0.1 0.0 - 0.9  s/co ratio    Comment: (NOTE)                                  Negative:     < 0.8                             Indeterminate: 0.8 - 0.9                                  Positive:     > 0.9 The CDC recommends that a positive HCV antibody result be followed up with a HCV Nucleic Acid Amplification test (841660). Performed At: [facility] [place], [place] [id number] Lindon Romp MD [id number]   Ferritin     Status: None   Collection Time: 06/21/16 12:50 PM  Result Value Ref Range   Ferritin 82 24 - 336 ng/mL    Comment: Performed at [facility], [place] [address], [place], [place] [address]  Iron and TIBC     Status: Abnormal   Collection Time: 06/21/16 12:50 PM  Result Value Ref Range   Iron 34 (L) 45 - 182 ug/dL   TIBC 358 250 - 450 ug/dL   Saturation Ratios 9 (L) 17.9 - 39.5 %   UIBC 324 ug/dL    Comment: Performed at [facility], [place] [address], [place], [place] [address]  Hepatitis B surface antigen     Status: None   Collection Time: 06/21/16 12:50 PM  Result Value Ref Range   Hepatitis B Surface Ag Negative Negative    Comment: (NOTE) Performed At: [facility] [address] [place], [place] [id number] Lindon Romp MD WV:[id number]   Glucose, capillary     Status: Abnormal   Collection Time: 06/21/16  4:26 PM  Result Value Ref Range   Glucose-Capillary 158 (H) 65 - 99 mg/dL   Comment 1 Notify RN   Glucose, capillary     Status: Abnormal   Collection Time: 06/21/16  8:44 PM  Result Value Ref Range   Glucose-Capillary 208 (H) 65 - 99 mg/dL  Glucose, capillary     Status: Abnormal   Collection Time: 06/22/16  5:00 AM  Result Value Ref Range   Glucose-Capillary 128 (H) 65 - 99 mg/dL  Basic metabolic panel     Status: Abnormal   Collection Time: 06/22/16  5:28 AM  Result Value Ref Range   Sodium 141 135 - 145 mmol/L   Potassium 4.2 3.5 - 5.1 mmol/L   Chloride 103 101 - 111 mmol/L   CO2 28 22 - 32 mmol/L    Glucose, Bld 134 (H) 65 - 99 mg/dL   BUN 97 (H) 6 - 20 mg/dL   Creatinine, Ser 5.01 (H) 0.61 - 1.24 mg/dL   Calcium 8.4 (L) 8.9 - 10.3 mg/dL   GFR calc non Af Amer 11 (L) >60 mL/min   GFR calc Af Amer 13 (L) >60 mL/min    Comment: (NOTE) The eGFR has been calculated using the  CKD EPI equation. This calculation has not been validated in all clinical situations. eGFR's persistently <60 mL/min signify possible Chronic Kidney Disease.    Anion gap 10 5 - 15  Glucose, capillary     Status: Abnormal   Collection Time: 06/22/16  7:41 AM  Result Value Ref Range   Glucose-Capillary 114 (H) 65 - 99 mg/dL  Glucose, capillary     Status: Abnormal   Collection Time: 06/22/16 11:37 AM  Result Value Ref Range   Glucose-Capillary 149 (H) 65 - 99 mg/dL  CBC with Differential/Platelet     Status: Abnormal   Collection Time: 06/22/16  3:25 PM  Result Value Ref Range   WBC 7.5 4.0 - 10.5 K/uL   RBC 2.95 (L) 4.22 - 5.81 MIL/uL   Hemoglobin 8.9 (L) 13.0 - 17.0 g/dL   HCT 27.5 (L) 39.0 - 52.0 %   MCV 93.2 78.0 - 100.0 fL   MCH 30.2 26.0 - 34.0 pg   MCHC 32.4 30.0 - 36.0 g/dL   RDW 13.9 11.5 - 15.5 %   Platelets 191 150 - 400 K/uL   Neutrophils Relative % 76 %   Neutro Abs 5.7 1.7 - 7.7 K/uL   Lymphocytes Relative 14 %   Lymphs Abs 1.1 0.7 - 4.0 K/uL   Monocytes Relative 6 %   Monocytes Absolute 0.5 0.1 - 1.0 K/uL   Eosinophils Relative 4 %   Eosinophils Absolute 0.3 0.0 - 0.7 K/uL   Basophils Relative 0 %   Basophils Absolute 0.0 0.0 - 0.1 K/uL  Glucose, capillary     Status: Abnormal   Collection Time: 06/22/16  6:31 PM  Result Value Ref Range   Glucose-Capillary 210 (H) 65 - 99 mg/dL   Comment 1 Notify RN    Comment 2 Document in Chart   Glucose, capillary     Status: Abnormal   Collection Time: 06/22/16  9:01 PM  Result Value Ref Range   Glucose-Capillary 185 (H) 65 - 99 mg/dL   Comment 1 Notify RN    Comment 2 Document in Chart    Dg Chest 1 View  Result Date:  06/21/2016 CLINICAL DATA:  Congestive heart failure EXAM: CHEST 1 VIEW COMPARISON:  06/18/2016 FINDINGS: Cardiomegaly again noted. Single lead cardiac pacemaker is unchanged in position. Improvement in aeration. Residual mild perihilar interstitial prominence without convincing pulmonary edema. Mild left basilar atelectasis. Stable trace bilateral pleural effusion. IMPRESSION: Improvement in aeration. Residual mild perihilar interstitial prominence without convincing pulmonary edema. Mild left basilar atelectasis. Trace bilateral pleural effusion again noted Electronically Signed   By: Lahoma Crocker M.D.   On: 06/21/2016 09:15    PMH:   Past Medical History:  Diagnosis Date  . AICD (automatic cardioverter/defibrillator) present   . Amputated below knee (HCC)    Right  . Anemia   . Arthritis   . Cerebrovascular disease   . CHF (congestive heart failure) ([place])   . Chronic kidney disease    Stage IV  . Diabetes mellitus    type II  . History of TIAs   . Hyperlipidemia   . Hypertension   . Nonischemic cardiomyopathy ([place])   . PVD (peripheral vascular disease) ([place])   . Shortness of breath dyspnea    with exertion   . Skin disease    Rare  . Stroke [facility])    "light stroke"  . Tobacco abuse     PSH:   Past Surgical History:  Procedure Laterality Date  . AV FISTULA  PLACEMENT Left 04/08/2015   Procedure: Creation of Left arm BRACHIOCEPHALIC ARTERIOVENOUS  FISTULA ;  Surgeon: Mal Misty, MD;  Location: [place];  Service: Vascular;  Laterality: Left;  . CARDIAC DEFIBRILLATOR PLACEMENT    . CARDIAC DEFIBRILLATOR PLACEMENT    . CATARACT EXTRACTION W/PHACO Right 03/17/2015   Procedure: CATARACT EXTRACTION PHACO AND INTRAOCULAR LENS PLACEMENT (IOC);  Surgeon: Rutherford Guys, MD;  Location: [facility];  Service: Ophthalmology;  Laterality: Right;  CDE: 6.59  . EYE SURGERY Right    Cataract  . LEG AMPUTATION BELOW KNEE     bilateral    Allergies:  Allergies  Allergen Reactions  . Acetazolamide  Other (See Comments)    Jittery odd feeling. (hyper feeling)  . Contrast Media [Iodinated Diagnostic Agents] Other (See Comments)    unknown  . Other Other (See Comments)    Transfer Dye---"Makes me tired"    Medications:   Prior to Admission medications   Medication Sig Start Date End Date Taking? Authorizing Provider  acetaminophen (TYLENOL) 500 MG tablet Take 1,000 mg by mouth daily as needed for mild pain or headache.    Yes Historical Provider, MD  amLODipine (NORVASC) 10 MG tablet Take 10 mg by mouth daily.     Yes Historical Provider, MD  aspirin 81 MG tablet Take 81 mg by mouth daily.     Yes Historical Provider, MD  brimonidine-timolol (COMBIGAN) 0.2-0.5 % ophthalmic solution Place 1 drop into the right eye every 12 (twelve) hours.   Yes Historical Provider, MD  calcium acetate (PHOSLO) 667 MG capsule Take 667 mg by mouth daily. With largest meal of the day 03/25/15  Yes Historical Provider, MD  carvedilol (COREG) 25 MG tablet Take 1 tablet (25 mg total) by mouth 2 (two) times daily. 03/02/11  Yes Evans Lance, MD  cloNIDine (CATAPRES) 0.1 MG tablet take 1 tablet by mouth twice a day Patient taking differently: take 1 tablet by mouth three times a day 06/13/11  Yes Evans Lance, MD  clopidogrel (PLAVIX) 75 MG tablet Take 75 mg by mouth daily.     Yes Historical Provider, MD  fenofibrate 160 MG tablet Take 160 mg by mouth daily. 03/09/15  Yes Historical Provider, MD  folic acid (FOLVITE) 1 MG tablet Take 1 mg by mouth daily.   Yes Historical Provider, MD  furosemide (LASIX) 20 MG tablet Take 80 mg by mouth 2 (two) times daily.    Yes Historical Provider, MD  HUMALOG KWIKPEN 100 UNIT/ML KiwkPen Inject 10 Units into the skin 3 (three) times daily.  04/01/15  Yes Historical Provider, MD  Insulin Glargine (LANTUS SOLOSTAR [place]) Inject 50 Units into the skin at bedtime.    Yes Historical Provider, MD  ofloxacin (OCUFLOX) 0.3 % ophthalmic solution Place 1 drop into the right eye 2 (two)  times daily.   Yes Historical Provider, MD  oxyCODONE (OXY IR/ROXICODONE) 5 MG immediate release tablet Take 1 tablet (5 mg total) by mouth at bedtime as needed for moderate pain. 04/08/15  Yes Ulyses Amor, PA-C  prednisoLONE acetate (PRED FORTE) 1 % ophthalmic suspension Place 1 drop into the right eye every 2 (two) hours while awake.   Yes Historical Provider, MD  rosuvastatin (CRESTOR) 20 MG tablet Take 20 mg by mouth daily. 02/24/15  Yes Historical Provider, MD  spironolactone (ALDACTONE) 25 MG tablet Take 25 mg by mouth 2 (two) times daily.    Historical Provider, MD  traMADol (ULTRAM) 50 MG tablet Take 1 tablet (50  mg total) by mouth every 6 (six) hours as needed. 05/17/15   Dorie Rank, MD  Vitamin D, Ergocalciferol, (DRISDOL) 50000 UNITS CAPS capsule Take 50,000 Units by mouth once a week. 02/12/15   Historical Provider, MD    Discontinued Meds:   Medications Discontinued During This Encounter  Medication Reason  . nitroGLYCERIN 50 mg in dextrose 5 % 250 mL (0.2 mg/mL) infusion   . furosemide (LASIX) injection 80 mg     Social History:  reports that he quit smoking about 18 years ago. He has a 20.00 pack-year smoking history. He has never used smokeless tobacco. He reports that he does not drink alcohol or use drugs.  Family History:   Family History  Problem Relation Age of Onset  . Diabetes Father   . Stroke Father   . Diabetes Brother   . Diabetes Brother   . Diabetes Brother   . Diabetes Brother   . Heart failure Mother   . Diabetes Mother   . Diabetes Other   . Coronary artery disease Other     Blood pressure (!) 152/72, pulse 74, temperature 98.8 F (37.1 C), temperature source Oral, resp. rate 20, height 6' (1.829 m), weight 118.4 kg (261 lb 0.4 oz), SpO2 93 %. General appearance: alert, cooperative and appears stated age Head: Normocephalic, without obvious abnormality, atraumatic Eyes: negative Neck: no adenopathy, no carotid bruit, no JVD, supple, symmetrical,  trachea midline and thyroid not enlarged, symmetric, no tenderness/mass/nodules Back: symmetric, no curvature. ROM normal. No CVA tenderness. Resp: clear to auscultation bilaterally Chest wall: no tenderness Cardio: regular rate and rhythm, S1, S2 normal, no murmur, click, rub or gallop GI: soft, non-tender; bowel sounds normal; no masses,  no organomegaly Extremities: B/L BKA well healed stumps Pulses: 2+ and symmetric Skin: Skin color, texture, turgor normal. No rashes or lesions Lymph nodes: Cervical, supraclavicular, and axillary nodes normal. Neurologic: Grossly normal  Access: Lt BCF w/ strong bruit w/ mild infiltration w/ poor augmentation       Jayelyn Barno, Hunt Oris, MD 06/23/2016, 5:13 AM

## 2016-06-24 NOTE — Progress Notes (Signed)
Subjective:  Seen after fistulogram/angio- NAD Objective Vital signs in last 24 hours: Vitals:   06/23/16 2026 06/24/16 0528 06/24/16 0827 06/24/16 1217  BP: (!) 167/71 (!) 149/52 (!) 158/69 140/89  Pulse: 69 73 68 65  Resp: 20 18 20 20   Temp: 98 F (36.7 C) 98.9 F (37.2 C) 98.3 F (36.8 C) 98.2 F (36.8 C)  TempSrc: Oral Oral Oral Oral  SpO2: 99% 100% 100% 95%  Weight:  112.1 kg (247 lb 3.2 oz)    Height:       Weight change: -6.271 kg (-13 lb 13.2 oz)  Intake/Output Summary (Last 24 hours) at 06/24/16 1329 Last data filed at 06/24/16 0930  Gross per 24 hour  Intake              240 ml  Output             2000 ml  Net            -1760 ml    Assessment/ Plan: Pt is a 61 y.o. yo male who was admitted on 06/18/2016 with SOB as well as advanced CKD- got initiated on HD as inpt at [facility]- only had one treatment then had access dysfunction limiting further HD  Assessment/Plan: 1. Vascular access- dysfunction s/p fistulogram and angioplasty which hopefully will make it accessible.  Will plan to do HD in AM to see 2. ESRD - new start to HD- attempt second treatment tomorrow- will make sure that arrangements are being made for OP HD in [place] at [facility] unit 3. Anemia- hgb 8- give iron and ESA 4. Secondary hyperparathyroidism- phos OK on phoslo- no PTH data 5. HTN/volume- BP slightly high- clonidine 0.1TID, coreg 25 BID, norvasc 10 and lasix 80 BID- makes actually lots of urine so will keep it on- BP will likely become easier to control once gets established on HD  Ohm Dentler A    Labs: Basic Metabolic Panel:  Recent Labs Lab 06/20/16 0436 06/21/16 0422 06/22/16 0528 06/23/16 0711 06/24/16 0257  NA 144 142 141  --  142  K 4.9 4.7 4.2  --  4.3  CL 110 106 103  --  102  CO2 23 25 28   --  28  GLUCOSE 118* 140* 134*  --  184*  BUN 123* 132* 97*  --  101*  CREATININE 6.15* 6.27* 5.01*  --  5.44*  CALCIUM 8.5* 8.4* 8.4*  --  8.4*  PHOS 6.8* 6.5*  --  4.7*   --    Liver Function Tests:  Recent Labs Lab 06/20/16 0436 06/21/16 0422  ALBUMIN 3.5 3.4*   No results for input(s): LIPASE, AMYLASE in the last 168 hours. No results for input(s): AMMONIA in the last 168 hours. CBC:  Recent Labs Lab 06/18/16 2310 06/19/16 1143 06/20/16 0436 06/21/16 1249 06/22/16 1525 06/24/16 0257  WBC 12.6* 8.1 8.1 5.6 7.5 6.8  NEUTROABS 11.0*  --   --   --  5.7  --   HGB 9.0* 8.2* 8.4* 8.3* 8.9* 8.1*  HCT 28.6* 25.4* 26.8* 25.4* 27.5* 25.9*  MCV 93.8 93.7 94.0 92.0 93.2 92.2  PLT 228 191 183 171 191 177   Cardiac Enzymes:  Recent Labs Lab 06/18/16 2310 06/19/16 0545 06/19/16 1143 06/19/16 1606  TROPONINI 0.03* 0.04* 0.04* 0.04*   CBG:  Recent Labs Lab 06/23/16 1230 06/23/16 1642 06/23/16 2125 06/24/16 0825 06/24/16 1217  GLUCAP 142* 194* 166* 124* 121*    Iron Studies: No results for input(s): IRON,  TIBC, TRANSFERRIN, FERRITIN in the last 72 hours. Studies/Results: No results found. Medications: Infusions:   Scheduled Medications: . amLODipine  10 mg Oral Daily  . aspirin EC  81 mg Oral Daily  . calcium acetate  667 mg Oral BID WC  . carvedilol  25 mg Oral BID  . cloNIDine  0.1 mg Oral TID  . clopidogrel  75 mg Oral Daily  . epoetin (EPOGEN/PROCRIT) injection  2,000 Units Subcutaneous Q T,Th,Sa-HD  . furosemide  80 mg Oral BID  . heparin  5,000 Units Subcutaneous Q8H  . insulin aspart  0-9 Units Subcutaneous TID WC  . multivitamin  1 tablet Oral QHS  . ofloxacin  1 drop Right Eye BID  . prednisoLONE acetate  1 drop Right Eye Q2H while awake  . rosuvastatin  20 mg Oral Daily  . sodium chloride flush  3 mL Intravenous Q12H    have reviewed scheduled and prn medications.  Physical Exam: General: obese, bilat amps Heart: RRR Lungs: mostly clear Abdomen: obese, soft, non tender Extremities: bilat AKAs Dialysis Access: left upper arm AVF- s/p intervention    06/24/2016,1:29 PM  LOS: 5 days

## 2016-06-24 NOTE — Progress Notes (Signed)
RN notified of pt BP of 158/69

## 2016-06-24 NOTE — Op Note (Addendum)
Procedure: Left arm fistulogram with angioplasty of proximal left brachiocephalic AV fistula  Preoperative diagnosis: Poorly functioning left arm AV fistula  Postoperative diagnosis: Same  Anesthesia: Local  Operative findings: #1 diffuse tubular narrowing proximal 4 cm of left brachiocephalic AV fistula 65% stenosis angioplasty to 0 residual stenosis up to 6 mm diameter #2 50% stenosis proximal third of fistula over short segment recoiled with 8 mm balloon  Operative details: After obtaining informed consent, the patient was taken the PV lab. The patient was placed in supine position on the Angio table. The left upper chest was prepped and draped in usual sterile fashion. Next local anesthesia was infiltrated just above the antecubital crease. Ultrasound was used to identify the left brachiocephalic AV fistula. This was cannulated under ultrasound guidance using a micropuncture needle. Micropuncture needle was threaded through the fistula. Michael sheath was then placed over this. Contrast Angio gram was obtained. The central venous structures including the superior vena cava innominate and left subclavian arteries are all widely patent. The axillary artery is widely patent. There are pacer wires proceeding through the subclavian vein but these do not cause any significant obstructive flow. The distal end of the fistula at the shoulder is widely patent. A blood pressure cuff was then inflated on the upper arm to reflux contrast across the anastomosis. The anastomosis is patent but there is a tubular 70% stenosis over the proximal 4 cm of the fistula. At this point I determined I could not put a balloon in the direction of the narrowing therefore the sheath was removed and the hole repaired with a single figure-of-eight Monocryl stitch. The patient's arm was reprepped and draped. Local anesthesia was then infiltrated over the distal biceps portion of the fistula. Ultrasound was used to cannulate this with  a micropuncture needle. Micripuncture wire was advanced across the arterial anastomosis.  The patient was given 3000 units of intravenous heparin. Contrast angiogram was then performed with pressure on the fistula to reflux across the anastomosis. This image was used for roadmapping. A 4 x 4 Angio plasty balloon was then advanced to the proximal aspect of the fistula and inflated to nominal pressure for 1 minute. There was minimal waist on the balloon. This was then exchanged for a 6 mm x 4 cm angioplasty balloon and this was also advanced to the same proximal portion the fistula and inflated to nominal pressure for 1 minute. There was good expansion of the fistula at this point. We had significantly expand the diameter to about 6 mm. The stenosis had completely resolved. An 8 mm x 2 cm Angio plasty balloon was then brought in operative field and this was used to angioplasty lesion that was 50% just distal to the proximal portion. This was inflated to nominal pressure for 1 minute. Completion Angio gram was performed which showed the fistula was still patent without any extravasation of contrast with resolution of the proximal stenosis but recoil of the more distal stenosis. Since the balloon had been inflated to a full 8 mm of the leg that this was not a significant stenosis at this point the procedure was concluded. The other micropuncture sheath was removed and the hole repaired with a single figure-of-eight Monocryl stitch. The patient tolerated procedure well and there were no complications. The patient was taken to the holding area in stable condition.  Operative management: The fistula remains amenable to percutaneous interventions in the future. The fistula is ready for cannulation at this point. If the fistula still has  poor flow for there is difficulty cannulating the fistula other options may need to be entertained.  Ruta Hinds, MD [facility] Office:  [phone] Pager: [phone]

## 2016-06-24 NOTE — Progress Notes (Signed)
Verified with Gay Filler in [facility] lab if ok to give pt ASA and plavix before his fistulogram this am instructed it was ok for him to have it.

## 2016-06-24 NOTE — Clinical Social Work Note (Signed)
CSW received call from Nira Conn [phone]) at [facility] yesterday stating that she had sent a referral to the [facility] intake coordinator for outpatient dialysis.  CSW signing off.   Dayton Scrape, [place]

## 2016-06-24 NOTE — Progress Notes (Signed)
Pt refused bed alarm.  Instructed to call for assistance.  Verbalized understanding.  Karie Kirks, Therapist, sports.

## 2016-06-25 LAB — RENAL FUNCTION PANEL
ALBUMIN: 3.1 g/dL — AB (ref 3.5–5.0)
Anion gap: 13 (ref 5–15)
BUN: 93 mg/dL — AB (ref 6–20)
CALCIUM: 8.5 mg/dL — AB (ref 8.9–10.3)
CO2: 27 mmol/L (ref 22–32)
CREATININE: 5.55 mg/dL — AB (ref 0.61–1.24)
Chloride: 102 mmol/L (ref 101–111)
GFR calc Af Amer: 11 mL/min — ABNORMAL LOW (ref >60)
GFR calc non Af Amer: 10 mL/min — ABNORMAL LOW (ref >60)
Glucose, Bld: 122 mg/dL — ABNORMAL HIGH (ref 65–99)
PHOSPHORUS: 4.9 mg/dL — AB (ref 2.5–4.6)
Potassium: 3.9 mmol/L (ref 3.5–5.1)
Sodium: 142 mmol/L (ref 135–145)

## 2016-06-25 LAB — GLUCOSE, CAPILLARY
GLUCOSE-CAPILLARY: 113 mg/dL — AB (ref 65–99)
GLUCOSE-CAPILLARY: 184 mg/dL — AB (ref 65–99)
GLUCOSE-CAPILLARY: 187 mg/dL — AB (ref 65–99)
Glucose-Capillary: 133 mg/dL — ABNORMAL HIGH (ref 65–99)
Glucose-Capillary: 177 mg/dL — ABNORMAL HIGH (ref 65–99)

## 2016-06-25 LAB — CBC
HEMATOCRIT: 26 % — AB (ref 39.0–52.0)
Hemoglobin: 8.1 g/dL — ABNORMAL LOW (ref 13.0–17.0)
MCH: 28.7 pg (ref 26.0–34.0)
MCHC: 31.2 g/dL (ref 30.0–36.0)
MCV: 92.2 fL (ref 78.0–100.0)
PLATELETS: 183 10*3/uL (ref 150–400)
RBC: 2.82 MIL/uL — ABNORMAL LOW (ref 4.22–5.81)
RDW: 13.7 % (ref 11.5–15.5)
WBC: 8 10*3/uL (ref 4.0–10.5)

## 2016-06-25 MED ORDER — AMLODIPINE BESYLATE 5 MG PO TABS
5.0000 mg | ORAL_TABLET | Freq: Every day | ORAL | Status: DC
  Administered 2016-06-25 – 2016-06-27 (×3): 5 mg via ORAL
  Filled 2016-06-25 (×3): qty 1

## 2016-06-25 MED ORDER — DARBEPOETIN ALFA 150 MCG/0.3ML IJ SOSY
PREFILLED_SYRINGE | INTRAMUSCULAR | Status: AC
  Administered 2016-06-25: 150 ug via INTRAVENOUS
  Filled 2016-06-25: qty 0.3

## 2016-06-25 MED ORDER — CLONIDINE HCL 0.1 MG PO TABS
0.1000 mg | ORAL_TABLET | Freq: Every day | ORAL | Status: DC
  Administered 2016-06-26 – 2016-06-28 (×3): 0.1 mg via ORAL
  Filled 2016-06-25 (×3): qty 1

## 2016-06-25 NOTE — Progress Notes (Signed)
Pt educated about safety and importance of bed alarm during the night however pt refuses to be on bed alarm. Will continue to round on patient.   Brunetta Newingham, RN    

## 2016-06-25 NOTE — Procedures (Signed)
Patient was seen on dialysis and the procedure was supervised.  BFR 250  Via AVF BP is  134/72.   Patient appears to be tolerating treatment well  [name] A 06/25/2016

## 2016-06-25 NOTE — Progress Notes (Signed)
PROGRESS NOTE    [name]  YTK:[id number] DOB: January 25, 1956 DOA: 06/18/2016 PCP: [name], MD    Brief Narrative:  61 year old man PMH diabetes mellitus, bilateral BKA, AICD, CHF-EF 20%, C KD with AV fistula placed but not on dialysis, presented with increasing swelling, weight gain and shortness of breath. Admitted for hypertensive urgency, CHF exacerbation, volume overload, acute hypoxic respiratory failure. Has subjectively improved with diuresis and acute hypoxia has resolved although renal function continues to worsen. Nephrology following. Patient was started on HD on 3/20.  On 3/21 his dialysis was stopped secondary to infiltration and when needle was withdrawn on both dialysis attempts clots were expressed.  He was transferred to [facility] for evaluation by vascular surgery and nephrology.   Assessment & Plan:   Acute hypoxic respiratory failure secondary to Volume overload CKD5 - improved, following first HD on 3/20 -repeat HD pending access, Renal following, vol status stable at this time  AKI on CKD4 /New ESRD - superimposed on chronic kidney disease stage IV - Started on HD on 3/21 - Patient fistula clotted and infiltrated on 3/21 - appreciate VVS consult, s/p Fistulogram/PTA 3/23  Anemia of chronic disease -on Epo an Iron per Renal  NICM/Cardiomyopathy previously EF 15-20%. Repeat ECHO with EF of 35-40% -EF 35-40%, volume managed with HD now -s/p AICD, lost to follow up with [facility]  Hypertensive urgency, accelerated hypertension - resolved. - Continue amlodipine, Coreg, clonidine, stop Aldactone - will need dose adjustment when HD resumed  Diabetes mellitus - Remains stable. Continue sliding scale insulin. -fu Hba1c, unclear if he's taking lantus any longer  Peripheral vascular disease,  -status post bilateral BKA.  DVT prophylaxis: heparin Code Status: full code Family Communication: none Disposition Plan: home when improved    Consultants:    Nephrology  CM  Procedures:  diffuse tubular narrowing proximal 4 cm of left brachiocephalic AV fistula 55% stenosis angioplasty   Antimicrobials:   none    Subjective: Feels ok, breathing no distress  Objective: Vitals:   06/25/16 1100 06/25/16 1136 06/25/16 1148 06/25/16 1248  BP: (!) 161/75 136/76 (!) 164/71 (!) 174/66  Pulse: 66 70 72 66  Resp:   16 20  Temp:   98 F (36.7 C) 98.7 F (37.1 C)  TempSrc:   Oral Oral  SpO2:   100% 100%  Weight:   111 kg (244 lb 11.4 oz)   Height:        Intake/Output Summary (Last 24 hours) at 06/25/16 1321 Last data filed at 06/25/16 1148  Gross per 24 hour  Intake              720 ml  Output             2697 ml  Net            -1977 ml   Filed Weights   06/25/16 0529 06/25/16 0810 06/25/16 1148  Weight: 111.1 kg (244 lb 14.4 oz) 111.2 kg (245 lb 2.4 oz) 111 kg (244 lb 11.4 oz)    Examination:  General exam: Appears calm and comfortable, no distress, pleasant Respiratory system: Respiratory effort normal. No wheezing, rhonchi or rales Cardiovascular system: S1 & S2 heard, RRR. No JVD, murmurs, rubs, gallops or clicks. No pedal edema. Gastrointestinal system: Abdomen is nondistended, soft and nontender. No organomegaly or masses felt. Normal bowel sounds heard. Central nervous system: Alert and oriented. No focal neurological deficits. Extremities: bilateral BKA's, LUE with AVF Skin: scattered scaling erythema rash on upper  extremities and lower extremities bilaterally Psychiatry: Judgement and insight appear normal. Mood & affect appropriate.     Data Reviewed: I have personally reviewed following labs and imaging studies  CBC:  Recent Labs Lab 06/18/16 2310  06/20/16 0436 06/21/16 1249 06/22/16 1525 06/24/16 0257 06/25/16 0848  WBC 12.6*  < > 8.1 5.6 7.5 6.8 8.0  NEUTROABS 11.0*  --   --   --  5.7  --   --   HGB 9.0*  < > 8.4* 8.3* 8.9* 8.1* 8.1*  HCT 28.6*  < > 26.8* 25.4* 27.5* 25.9* 26.0*  MCV 93.8  < >  94.0 92.0 93.2 92.2 92.2  PLT 228  < > 183 171 191 177 183  < > = values in this interval not displayed. Basic Metabolic Panel:  Recent Labs Lab 06/20/16 0436 06/21/16 0422 06/22/16 0528 06/23/16 0711 06/24/16 0257 06/25/16 0849  NA 144 142 141  --  142 142  K 4.9 4.7 4.2  --  4.3 3.9  CL 110 106 103  --  102 102  CO2 23 25 28   --  28 27  GLUCOSE 118* 140* 134*  --  184* 122*  BUN 123* 132* 97*  --  101* 93*  CREATININE 6.15* 6.27* 5.01*  --  5.44* 5.55*  CALCIUM 8.5* 8.4* 8.4*  --  8.4* 8.5*  PHOS 6.8* 6.5*  --  4.7*  --  4.9*   GFR: Estimated Creatinine Clearance: 17.5 mL/min (A) (by C-G formula based on SCr of 5.55 mg/dL (H)). Liver Function Tests:  Recent Labs Lab 06/20/16 0436 06/21/16 0422 06/25/16 0849  ALBUMIN 3.5 3.4* 3.1*   No results for input(s): LIPASE, AMYLASE in the last 168 hours. No results for input(s): AMMONIA in the last 168 hours. Coagulation Profile:  Recent Labs Lab 06/18/16 2310 06/24/16 0257  INR 1.30 1.22   Cardiac Enzymes:  Recent Labs Lab 06/18/16 2310 06/19/16 0545 06/19/16 1143 06/19/16 1606  TROPONINI 0.03* 0.04* 0.04* 0.04*   BNP (last 3 results) No results for input(s): PROBNP in the last 8760 hours. HbA1C: No results for input(s): HGBA1C in the last 72 hours. CBG:  Recent Labs Lab 06/24/16 1637 06/24/16 2113 06/25/16 0727 06/25/16 0927 06/25/16 1246  GLUCAP 215* 141* 133* 113* 177*   Lipid Profile: No results for input(s): CHOL, HDL, LDLCALC, TRIG, CHOLHDL, LDLDIRECT in the last 72 hours. Thyroid Function Tests: No results for input(s): TSH, T4TOTAL, FREET4, T3FREE, THYROIDAB in the last 72 hours. Anemia Panel: No results for input(s): VITAMINB12, FOLATE, FERRITIN, TIBC, IRON, RETICCTPCT in the last 72 hours. Sepsis Labs: No results for input(s): PROCALCITON, LATICACIDVEN in the last 168 hours.  Recent Results (from the past 240 hour(s))  MRSA PCR Screening     Status: None   Collection Time: 06/19/16   4:31 AM  Result Value Ref Range Status   MRSA by PCR NEGATIVE NEGATIVE Final    Comment:        The GeneXpert MRSA Assay (FDA approved for NASAL specimens only), is one component of a comprehensive MRSA colonization surveillance program. It is not intended to diagnose MRSA infection nor to guide or monitor treatment for MRSA infections.          Radiology Studies: No results found.      Scheduled Meds: . amLODipine  5 mg Oral Daily  . aspirin EC  81 mg Oral Daily  . calcium acetate  667 mg Oral BID WC  . carvedilol  25 mg Oral BID  . [  START ON 06/26/2016] cloNIDine  0.1 mg Oral QHS  . clopidogrel  75 mg Oral Daily  . darbepoetin (ARANESP) injection - DIALYSIS  150 mcg Intravenous Q Sat-HD  . ferric gluconate (FERRLECIT/NULECIT) IV  125 mg Intravenous Daily  . furosemide  80 mg Oral BID  . heparin  5,000 Units Subcutaneous Q8H  . hydrocerin   Topical BID  . insulin aspart  0-9 Units Subcutaneous TID WC  . multivitamin  1 tablet Oral QHS  . ofloxacin  1 drop Right Eye BID  . prednisoLONE acetate  1 drop Right Eye Q2H while awake  . rosuvastatin  20 mg Oral Daily  . sodium chloride flush  3 mL Intravenous Q12H   Continuous Infusions:   LOS: 6 days    Time spent: 35 minutes    Domenic Polite, MD [facility] Pager [phone]  If 7PM-7AM, please contact night-coverage [email or web address] Password [email or web address] 06/25/2016, 1:21 PM

## 2016-06-25 NOTE — Progress Notes (Signed)
PPD read  To lt fa Negitive.

## 2016-06-25 NOTE — Progress Notes (Signed)
Subjective:  Seen on HD- able to cannulate fistula without difficulty- BFR 250 Objective Vital signs in last 24 hours: Vitals:   06/25/16 0845 06/25/16 0900 06/25/16 0930 06/25/16 1000  BP: (!) 156/74 (!) 144/71 137/70 134/72  Pulse: 63 63 64 62  Resp: 18     Temp:      TempSrc:      SpO2:      Weight:      Height:       Weight change: -1.043 kg (-2 lb 4.8 oz)  Intake/Output Summary (Last 24 hours) at 06/25/16 1016 Last data filed at 06/25/16 0531  Gross per 24 hour  Intake              720 ml  Output             2325 ml  Net            -1605 ml    Assessment/ Plan: Pt is a 61 y.o. yo male who was admitted on 06/18/2016 with SOB as well as advanced CKD- got initiated on HD as inpt at [facility]- only had one treatment then had access dysfunction limiting further HD  Assessment/Plan: 1. Vascular access- dysfunction s/p fistulogram and angioplasty 3/23 which hopefully will make it accessible.  Able to use this AM 2. ESRD - new start to HD- second treatment today- will make sure that arrangements are being made for OP HD in [place] at [facility] unit.  Next HD treatment Monday  3. Anemia- hgb 8- giving iron and ESA 4. Secondary hyperparathyroidism- phos OK on phoslo- no PTH data yet- will draw 5. HTN/volume- BP was slightly high- clonidine 0.1TID, coreg 25 BID, norvasc 10 and lasix 80 BID- makes actually lots of urine so will keep it on- BP will likely become easier to control once gets established on HD- BP a little low with HD so will wean clonidine and decrease norvasc   Elyzabeth Goatley A    Labs: Basic Metabolic Panel:  Recent Labs Lab 06/21/16 0422 06/22/16 0528 06/23/16 0711 06/24/16 0257 06/25/16 0849  NA 142 141  --  142 142  K 4.7 4.2  --  4.3 3.9  CL 106 103  --  102 102  CO2 25 28  --  28 27  GLUCOSE 140* 134*  --  184* 122*  BUN 132* 97*  --  101* 93*  CREATININE 6.27* 5.01*  --  5.44* 5.55*  CALCIUM 8.4* 8.4*  --  8.4* 8.5*  PHOS 6.5*  --  4.7*  --   4.9*   Liver Function Tests:  Recent Labs Lab 06/20/16 0436 06/21/16 0422 06/25/16 0849  ALBUMIN 3.5 3.4* 3.1*   No results for input(s): LIPASE, AMYLASE in the last 168 hours. No results for input(s): AMMONIA in the last 168 hours. CBC:  Recent Labs Lab 06/18/16 2310  06/20/16 0436 06/21/16 1249 06/22/16 1525 06/24/16 0257 06/25/16 0848  WBC 12.6*  < > 8.1 5.6 7.5 6.8 8.0  NEUTROABS 11.0*  --   --   --  5.7  --   --   HGB 9.0*  < > 8.4* 8.3* 8.9* 8.1* 8.1*  HCT 28.6*  < > 26.8* 25.4* 27.5* 25.9* 26.0*  MCV 93.8  < > 94.0 92.0 93.2 92.2 92.2  PLT 228  < > 183 171 191 177 183  < > = values in this interval not displayed. Cardiac Enzymes:  Recent Labs Lab 06/18/16 2310 06/19/16 0545 06/19/16 1143 06/19/16 1606  TROPONINI 0.03* 0.04* 0.04* 0.04*   CBG:  Recent Labs Lab 06/24/16 1217 06/24/16 1637 06/24/16 2113 06/25/16 0727 06/25/16 0927  GLUCAP 121* 215* 141* 133* 113*    Iron Studies: No results for input(s): IRON, TIBC, TRANSFERRIN, FERRITIN in the last 72 hours. Studies/Results: No results found. Medications: Infusions:   Scheduled Medications: . amLODipine  10 mg Oral Daily  . aspirin EC  81 mg Oral Daily  . calcium acetate  667 mg Oral BID WC  . carvedilol  25 mg Oral BID  . cloNIDine  0.1 mg Oral TID  . clopidogrel  75 mg Oral Daily  . darbepoetin (ARANESP) injection - DIALYSIS  150 mcg Intravenous Q Sat-HD  . ferric gluconate (FERRLECIT/NULECIT) IV  125 mg Intravenous Daily  . furosemide  80 mg Oral BID  . heparin  5,000 Units Subcutaneous Q8H  . hydrocerin   Topical BID  . insulin aspart  0-9 Units Subcutaneous TID WC  . multivitamin  1 tablet Oral QHS  . ofloxacin  1 drop Right Eye BID  . prednisoLONE acetate  1 drop Right Eye Q2H while awake  . rosuvastatin  20 mg Oral Daily  . sodium chloride flush  3 mL Intravenous Q12H    have reviewed scheduled and prn medications.  Physical Exam: General: obese, bilat amps Heart:  RRR Lungs: mostly clear Abdomen: obese, soft, non tender Extremities: bilat AKAs Dialysis Access: left upper arm AVF- accessed for HD   06/25/2016,10:16 AM  LOS: 6 days

## 2016-06-26 LAB — GLUCOSE, CAPILLARY
GLUCOSE-CAPILLARY: 126 mg/dL — AB (ref 65–99)
GLUCOSE-CAPILLARY: 204 mg/dL — AB (ref 65–99)
GLUCOSE-CAPILLARY: 152 mg/dL — AB (ref 65–99)
Glucose-Capillary: 187 mg/dL — ABNORMAL HIGH (ref 65–99)

## 2016-06-26 LAB — PARATHYROID HORMONE, INTACT (NO CA): PTH: 97 pg/mL — ABNORMAL HIGH (ref 15–65)

## 2016-06-26 MED ORDER — ROSUVASTATIN CALCIUM 10 MG PO TABS
10.0000 mg | ORAL_TABLET | Freq: Every day | ORAL | Status: DC
  Administered 2016-06-26 – 2016-06-28 (×3): 10 mg via ORAL
  Filled 2016-06-26 (×3): qty 1

## 2016-06-26 NOTE — Progress Notes (Signed)
Inpatient Diabetes Program Recommendations  AACE/ADA: New Consensus Statement on Inpatient Glycemic Control (2015)  Target Ranges:  Prepandial:   less than 140 mg/dL      Peak postprandial:   less than 180 mg/dL (1-2 hours)      Critically ill patients:  140 - 180 mg/dL   Review of Glycemic Control  Diabetes history: DM 2 Outpatient Diabetes medications: Lantus 50, Humalog 10 units TID with meals Current orders for Inpatient glycemic control: Novolog Sensitive Correction TID  Spoke with patient over the phone. Patient reports having several low blood sugars prior to admission. Hypoglycemia only with fasting blood sugars, lowest experienced in the 50's. Patient followed by PCP for DM management last appt was scheduled in January, however, due to the weather patient was unable to go. Spoke with patient about checking his glucose levels continually and call his PCP every 3 days if glucose levels are too high or too low for medication adjustments. Patient lives by himself. Spoke to patient about his glucose trends in the hospital and that he is only on a sensitive correction while here. Patient reports following a DM diet.   Patient's current home meds Lantus 50 units is 0.45 units/kg Consider for discharge Lantus 36 units (little less than 0.35 units/kg). Patient does not report lows during the day.   Warned patient about the possibility of his body holding on to insulin.  Thanks,  Tama Headings RN, MSN, [facility] Inpatient Diabetes Coordinator Team Pager [phone] (8a-5p)

## 2016-06-26 NOTE — Progress Notes (Signed)
Patient with no complaints or concerns during 7pm - 7am shift. Will continue to monitor.   Karyn Brull, RN

## 2016-06-26 NOTE — Progress Notes (Signed)
PROGRESS NOTE    [name]  ZOX:[id number] DOB: 01/19/1956 DOA: 06/18/2016 PCP: [name], MD    Brief Narrative:  61 year old man PMH diabetes mellitus, bilateral BKA, AICD, CHF-EF 20%, C KD with AV fistula placed but not on dialysis, presented with increasing swelling, weight gain and shortness of breath. Admitted for hypertensive urgency, CHF exacerbation, volume overload, acute hypoxic respiratory failure. Has subjectively improved with diuresis and acute hypoxia has resolved although renal function continues to worsen. Nephrology following. Patient was started on HD on 3/20.  On 3/21 his dialysis was stopped secondary to infiltration and when needle was withdrawn on both dialysis attempts clots were expressed.  He was transferred to [facility] for evaluation by vascular surgery and nephrology.   Assessment & Plan:   Acute hypoxic respiratory failure secondary to Volume overload CKD5 - improved, following first HD on 3/20 -repeat HD 3/24, Renal following, vol status stable at this time  AKI on CKD4 /New ESRD - superimposed on chronic kidney disease stage IV - Started on HD on 3/21 - Patient fistula clotted and infiltrated on 3/21 - appreciate VVS consult, s/p Fistulogram/PTA 3/23 -Await clip  Anemia of chronic disease -on Epo an Iron per Renal  NICM/Cardiomyopathy previously EF 15-20%. Repeat ECHO with EF of 35-40% -EF 35-40%, volume managed with HD now -s/p AICD, lost to follow up with [facility]  Hypertensive urgency, accelerated hypertension - resolved. - Cut down clonidine, stopped Aldactone, continue Coreg -Start either clonidine or amlodipine tomorrow depending on blood pressure  Diabetes mellitus - Remains stable. Continue sliding scale insulin. - on lantus and humalog at home, doesn't need anything now, await DM coordinator consult, may resume SSI at home  Peripheral vascular disease,  -status post bilateral BKA.  DVT prophylaxis: heparin Code Status: full  code Family Communication: none Disposition Plan: home when improved    Consultants:   Nephrology  CM  Procedures:  diffuse tubular narrowing proximal 4 cm of left brachiocephalic AV fistula 81% stenosis angioplasty   Antimicrobials:   none    Subjective: Feels ok, breathing no distress  Objective: Vitals:   06/25/16 1148 06/25/16 1248 06/25/16 2155 06/26/16 0515  BP: (!) 164/71 (!) 174/66 (!) 155/66 (!) 161/65  Pulse: 72 66 72 74  Resp: 16 20 18 17   Temp: 98 F (36.7 C) 98.7 F (37.1 C) 98.3 F (36.8 C) 98.2 F (36.8 C)  TempSrc: Oral Oral Oral Oral  SpO2: 100% 100% 97% 97%  Weight: 111 kg (244 lb 11.4 oz)   110.8 kg (244 lb 4.8 oz)  Height:        Intake/Output Summary (Last 24 hours) at 06/26/16 1043 Last data filed at 06/26/16 1011  Gross per 24 hour  Intake              720 ml  Output             2547 ml  Net            -1827 ml   Filed Weights   06/25/16 0810 06/25/16 1148 06/26/16 0515  Weight: 111.2 kg (245 lb 2.4 oz) 111 kg (244 lb 11.4 oz) 110.8 kg (244 lb 4.8 oz)    Examination:  General exam: Appears calm and comfortable, no distress, pleasant Respiratory system: Respiratory effort normal. No wheezing, rhonchi or rales Cardiovascular system: S1 & S2 heard, RRR. No JVD, murmurs, rubs, gallops or clicks. No pedal edema. Gastrointestinal system: Abdomen is nondistended, soft and nontender. No organomegaly or masses felt.  Normal bowel sounds heard. Central nervous system: Alert and oriented. No focal neurological deficits. Extremities: bilateral BKA's, LUE with AVF Skin: scattered scaling erythema rash on upper extremities and lower extremities bilaterally Psychiatry: Judgement and insight appear normal. Mood & affect appropriate.     Data Reviewed: I have personally reviewed following labs and imaging studies  CBC:  Recent Labs Lab 06/20/16 0436 06/21/16 1249 06/22/16 1525 06/24/16 0257 06/25/16 0848  WBC 8.1 5.6 7.5 6.8 8.0    NEUTROABS  --   --  5.7  --   --   HGB 8.4* 8.3* 8.9* 8.1* 8.1*  HCT 26.8* 25.4* 27.5* 25.9* 26.0*  MCV 94.0 92.0 93.2 92.2 92.2  PLT 183 171 191 177 850   Basic Metabolic Panel:  Recent Labs Lab 06/20/16 0436 06/21/16 0422 06/22/16 0528 06/23/16 0711 06/24/16 0257 06/25/16 0849  NA 144 142 141  --  142 142  K 4.9 4.7 4.2  --  4.3 3.9  CL 110 106 103  --  102 102  CO2 23 25 28   --  28 27  GLUCOSE 118* 140* 134*  --  184* 122*  BUN 123* 132* 97*  --  101* 93*  CREATININE 6.15* 6.27* 5.01*  --  5.44* 5.55*  CALCIUM 8.5* 8.4* 8.4*  --  8.4* 8.5*  PHOS 6.8* 6.5*  --  4.7*  --  4.9*   GFR: Estimated Creatinine Clearance: 17.5 mL/min (A) (by C-G formula based on SCr of 5.55 mg/dL (H)). Liver Function Tests:  Recent Labs Lab 06/20/16 0436 06/21/16 0422 06/25/16 0849  ALBUMIN 3.5 3.4* 3.1*   No results for input(s): LIPASE, AMYLASE in the last 168 hours. No results for input(s): AMMONIA in the last 168 hours. Coagulation Profile:  Recent Labs Lab 06/24/16 0257  INR 1.22   Cardiac Enzymes:  Recent Labs Lab 06/19/16 1143 06/19/16 1606  TROPONINI 0.04* 0.04*   BNP (last 3 results) No results for input(s): PROBNP in the last 8760 hours. HbA1C: No results for input(s): HGBA1C in the last 72 hours. CBG:  Recent Labs Lab 06/25/16 0927 06/25/16 1246 06/25/16 1654 06/25/16 2153 06/26/16 0735  GLUCAP 113* 177* 184* 187* 126*   Lipid Profile: No results for input(s): CHOL, HDL, LDLCALC, TRIG, CHOLHDL, LDLDIRECT in the last 72 hours. Thyroid Function Tests: No results for input(s): TSH, T4TOTAL, FREET4, T3FREE, THYROIDAB in the last 72 hours. Anemia Panel: No results for input(s): VITAMINB12, FOLATE, FERRITIN, TIBC, IRON, RETICCTPCT in the last 72 hours. Sepsis Labs: No results for input(s): PROCALCITON, LATICACIDVEN in the last 168 hours.  Recent Results (from the past 240 hour(s))  MRSA PCR Screening     Status: None   Collection Time: 06/19/16  4:31 AM   Result Value Ref Range Status   MRSA by PCR NEGATIVE NEGATIVE Final    Comment:        The GeneXpert MRSA Assay (FDA approved for NASAL specimens only), is one component of a comprehensive MRSA colonization surveillance program. It is not intended to diagnose MRSA infection nor to guide or monitor treatment for MRSA infections.          Radiology Studies: No results found.      Scheduled Meds: . amLODipine  5 mg Oral Daily  . aspirin EC  81 mg Oral Daily  . calcium acetate  667 mg Oral BID WC  . carvedilol  25 mg Oral BID  . cloNIDine  0.1 mg Oral QHS  . clopidogrel  75 mg Oral Daily  .  darbepoetin (ARANESP) injection - DIALYSIS  150 mcg Intravenous Q Sat-HD  . ferric gluconate (FERRLECIT/NULECIT) IV  125 mg Intravenous Daily  . furosemide  80 mg Oral BID  . heparin  5,000 Units Subcutaneous Q8H  . hydrocerin   Topical BID  . insulin aspart  0-9 Units Subcutaneous TID WC  . multivitamin  1 tablet Oral QHS  . ofloxacin  1 drop Right Eye BID  . prednisoLONE acetate  1 drop Right Eye Q2H while awake  . rosuvastatin  10 mg Oral Daily  . sodium chloride flush  3 mL Intravenous Q12H   Continuous Infusions:   LOS: 7 days    Time spent: 35 minutes    Domenic Polite, MD [facility] Pager [phone]  If 7PM-7AM, please contact night-coverage [email or web address] Password [facility] 06/26/2016, 10:43 AM

## 2016-06-26 NOTE — Progress Notes (Signed)
Subjective:  HD yest- really did not remove much due to low BP- had decreased BP meds- BP back up today - no new complaints Objective Vital signs in last 24 hours: Vitals:   06/25/16 1148 06/25/16 1248 06/25/16 2155 06/26/16 0515  BP: (!) 164/71 (!) 174/66 (!) 155/66 (!) 161/65  Pulse: 72 66 72 74  Resp: 16 20 18 17   Temp: 98 F (36.7 C) 98.7 F (37.1 C) 98.3 F (36.8 C) 98.2 F (36.8 C)  TempSrc: Oral Oral Oral Oral  SpO2: 100% 100% 97% 97%  Weight: 111 kg (244 lb 11.4 oz)   110.8 kg (244 lb 4.8 oz)  Height:       Weight change: 0.114 kg (4 oz)  Intake/Output Summary (Last 24 hours) at 06/26/16 1003 Last data filed at 06/26/16 3557  Gross per 24 hour  Intake              480 ml  Output             2547 ml  Net            -2067 ml    Assessment/ Plan: Pt is a 61 y.o. yo male who was admitted on 06/18/2016 with SOB as well as advanced CKD- got initiated on HD as inpt at [facility]- only had one treatment then had access dysfunction limiting further HD  Assessment/Plan: 1. Vascular access- dysfunction s/p fistulogram and angioplasty 3/23 which hopefully will make it accessible.  Able to use 3/24- will plan to use again tomorrow 2. ESRD - new start to HD- second treatment 3/24- will make sure that arrangements are being made for OP HD in [place] at [facility] unit.  Next HD treatment Monday  3. Anemia- hgb 8- giving iron and ESA 4. Secondary hyperparathyroidism- phos OK on phoslo-  PTH pending 5. HTN/volume- BP was slightly high- clonidine 0.1TID, coreg 25 BID, norvasc 10 and lasix 80 BID- makes actually lots of urine so will keepon lasix- BP will likely become easier to control once gets established on HD- BP a little low with HD so will wean clonidine and decrease norvasc - try to achieve more UF with HD 3/26  Aslynn Brunetti A    Labs: Basic Metabolic Panel:  Recent Labs Lab 06/21/16 0422 06/22/16 0528 06/23/16 0711 06/24/16 0257 06/25/16 0849  NA 142 141  --  142  142  K 4.7 4.2  --  4.3 3.9  CL 106 103  --  102 102  CO2 25 28  --  28 27  GLUCOSE 140* 134*  --  184* 122*  BUN 132* 97*  --  101* 93*  CREATININE 6.27* 5.01*  --  5.44* 5.55*  CALCIUM 8.4* 8.4*  --  8.4* 8.5*  PHOS 6.5*  --  4.7*  --  4.9*   Liver Function Tests:  Recent Labs Lab 06/20/16 0436 06/21/16 0422 06/25/16 0849  ALBUMIN 3.5 3.4* 3.1*   No results for input(s): LIPASE, AMYLASE in the last 168 hours. No results for input(s): AMMONIA in the last 168 hours. CBC:  Recent Labs Lab 06/20/16 0436 06/21/16 1249 06/22/16 1525 06/24/16 0257 06/25/16 0848  WBC 8.1 5.6 7.5 6.8 8.0  NEUTROABS  --   --  5.7  --   --   HGB 8.4* 8.3* 8.9* 8.1* 8.1*  HCT 26.8* 25.4* 27.5* 25.9* 26.0*  MCV 94.0 92.0 93.2 92.2 92.2  PLT 183 171 191 177 183   Cardiac Enzymes:  Recent Labs Lab 06/19/16  1143 06/19/16 1606  TROPONINI 0.04* 0.04*   CBG:  Recent Labs Lab 06/25/16 0927 06/25/16 1246 06/25/16 1654 06/25/16 2153 06/26/16 0735  GLUCAP 113* 177* 184* 187* 126*    Iron Studies: No results for input(s): IRON, TIBC, TRANSFERRIN, FERRITIN in the last 72 hours. Studies/Results: No results found. Medications: Infusions:   Scheduled Medications: . amLODipine  5 mg Oral Daily  . aspirin EC  81 mg Oral Daily  . calcium acetate  667 mg Oral BID WC  . carvedilol  25 mg Oral BID  . cloNIDine  0.1 mg Oral QHS  . clopidogrel  75 mg Oral Daily  . darbepoetin (ARANESP) injection - DIALYSIS  150 mcg Intravenous Q Sat-HD  . ferric gluconate (FERRLECIT/NULECIT) IV  125 mg Intravenous Daily  . furosemide  80 mg Oral BID  . heparin  5,000 Units Subcutaneous Q8H  . hydrocerin   Topical BID  . insulin aspart  0-9 Units Subcutaneous TID WC  . multivitamin  1 tablet Oral QHS  . ofloxacin  1 drop Right Eye BID  . prednisoLONE acetate  1 drop Right Eye Q2H while awake  . rosuvastatin  10 mg Oral Daily  . sodium chloride flush  3 mL Intravenous Q12H    have reviewed scheduled and  prn medications.  Physical Exam: General: obese, bilat amps Heart: RRR Lungs: mostly clear Abdomen: obese, soft, non tender Extremities: bilat AKAs Dialysis Access: left upper arm AVF- accessed for HD   06/26/2016,10:03 AM  LOS: 7 days

## 2016-06-27 LAB — GLUCOSE, CAPILLARY
GLUCOSE-CAPILLARY: 151 mg/dL — AB (ref 65–99)
Glucose-Capillary: 200 mg/dL — ABNORMAL HIGH (ref 65–99)
Glucose-Capillary: 317 mg/dL — ABNORMAL HIGH (ref 65–99)

## 2016-06-27 LAB — HEMOGLOBIN A1C
Hgb A1c MFr Bld: 5.4 % (ref 4.8–5.6)
Mean Plasma Glucose: 108 mg/dL

## 2016-06-27 LAB — RENAL FUNCTION PANEL
ANION GAP: 11 (ref 5–15)
Albumin: 3 g/dL — ABNORMAL LOW (ref 3.5–5.0)
BUN: 59 mg/dL — ABNORMAL HIGH (ref 6–20)
CALCIUM: 8.3 mg/dL — AB (ref 8.9–10.3)
CO2: 28 mmol/L (ref 22–32)
CREATININE: 4.91 mg/dL — AB (ref 0.61–1.24)
Chloride: 101 mmol/L (ref 101–111)
GFR, EST AFRICAN AMERICAN: 13 mL/min — AB (ref >60)
GFR, EST NON AFRICAN AMERICAN: 11 mL/min — AB (ref >60)
Glucose, Bld: 219 mg/dL — ABNORMAL HIGH (ref 65–99)
Phosphorus: 3.6 mg/dL (ref 2.5–4.6)
Potassium: 4.1 mmol/L (ref 3.5–5.1)
SODIUM: 140 mmol/L (ref 135–145)

## 2016-06-27 LAB — CBC
HCT: 27.5 % — ABNORMAL LOW (ref 39.0–52.0)
HEMOGLOBIN: 8.4 g/dL — AB (ref 13.0–17.0)
MCH: 28.9 pg (ref 26.0–34.0)
MCHC: 30.5 g/dL (ref 30.0–36.0)
MCV: 94.5 fL (ref 78.0–100.0)
PLATELETS: 185 10*3/uL (ref 150–400)
RBC: 2.91 MIL/uL — AB (ref 4.22–5.81)
RDW: 13.5 % (ref 11.5–15.5)
WBC: 10.2 10*3/uL (ref 4.0–10.5)

## 2016-06-27 MED ORDER — ACETAMINOPHEN 325 MG PO TABS
ORAL_TABLET | ORAL | Status: AC
  Filled 2016-06-27: qty 2

## 2016-06-27 MED ORDER — INSULIN GLARGINE 100 UNIT/ML ~~LOC~~ SOLN
5.0000 [IU] | Freq: Every day | SUBCUTANEOUS | Status: DC
  Administered 2016-06-27 – 2016-06-28 (×2): 5 [IU] via SUBCUTANEOUS
  Filled 2016-06-27 (×2): qty 0.05

## 2016-06-27 MED ORDER — HEPARIN SODIUM (PORCINE) 1000 UNIT/ML DIALYSIS: 20.0000 [IU]/kg | INTRAMUSCULAR | Status: DC | PRN

## 2016-06-27 NOTE — Progress Notes (Signed)
Inpatient Diabetes Program Recommendations  AACE/ADA: New Consensus Statement on Inpatient Glycemic Control (2015)  Target Ranges:  Prepandial:   less than 140 mg/dL      Peak postprandial:   less than 180 mg/dL (1-2 hours)      Critically ill patients:  140 - 180 mg/dL   Lab Results  Component Value Date   GLUCAP 151 (H) 06/27/2016   HGBA1C 10.0 (H) 11/04/2010    Review of Glycemic Control Results for [name], [name] (MRN [id number]) as of 06/27/2016 09:06  Ref. Range 06/26/2016 07:35 06/26/2016 11:20 06/26/2016 16:23 06/26/2016 21:32 06/27/2016 07:49  Glucose-Capillary Latest Ref Range: 65 - 99 mg/dL 126 (H) 204 (H) 152 (H) 187 (H) 151 (H)   Diabetes history: DM 2 Outpatient Diabetes medications: Lantus 50, Humalog 10 units TID with meals Current orders for Inpatient glycemic control: Novolog Sensitive Correction TID  Inpatient Diabetes Program Recommendations:  Reviewed CBG trends and has only required 6 units Novolog correction over the past 24 hrs. Please consider: -Lantus 5 units daily -Novolog correction scale custom 0-5 units tid   150-200  1 unit    201-250  2 units    251-300 3 units    301-350  4 units    351-400  5 units    > 400      Call MD  Thank you, Nani Gasser. Adleigh Mcmasters, RN, MSN, CDE Inpatient Glycemic Control Team Team Pager [phone] (8am-5pm) 06/27/2016 9:09 AM

## 2016-06-27 NOTE — Progress Notes (Signed)
PT Cancellation Note  Patient Details Name: [name] MRN: [id number] DOB: [birth year]   Cancelled Treatment:    Reason Eval/Treat Not Completed: Patient at procedure or test/unavailable.  Pt currently in HD.  Will f/u as appropriate.     Thornton Papas Providencia Hottenstein 06/27/2016, 4:14 PM

## 2016-06-27 NOTE — Progress Notes (Addendum)
PROGRESS NOTE    [name]  PZW:[id number] DOB: 05/17/1955 DOA: 06/18/2016 PCP: [name], MD    Brief Narrative:  61 year old man PMH diabetes mellitus, bilateral BKA, AICD, CHF-EF 20%, C KD with AV fistula placed but not on dialysis, presented with increasing swelling, weight gain and shortness of breath. Admitted for hypertensive urgency, CHF exacerbation, volume overload, acute hypoxic respiratory failure. Has subjectively improved with diuresis and acute hypoxia has resolved although renal function continues to worsen. Nephrology following. Patient was started on HD on 3/20.  On 3/21 his dialysis was stopped secondary to infiltration and when needle was withdrawn on both dialysis attempts clots were expressed.  He was transferred to [facility] for evaluation by vascular surgery and nephrology.   Assessment & Plan:   Acute hypoxic respiratory failure secondary to Volume overload CKD5 - improved, following first HD on 3/20 -repeat HD 3/24, Renal following, vol status stable at this time  AKI on CKD4 /New ESRD - superimposed on chronic kidney disease stage IV - Started on HD on 3/21 - Patient fistula clotted and infiltrated on 3/21 - appreciate VVS consult, s/p Fistulogram/PTA 3/23 -Await clip  Anemia of chronic disease -on Epo an Iron per Renal -stable at 8  NICM/Cardiomyopathy previously EF 15-20%. Repeat ECHO with EF of 35-40% -EF 35-40%, volume managed with HD now -s/p AICD, lost to follow up with [facility]  Hypertensive urgency, accelerated hypertension - resolved. - Cut down clonidine, stopped Aldactone, continue Coreg -Stop either clonidine or amlodipine depending on how blood pressure trends  Diabetes mellitus - Remains stable. Continue sliding scale insulin. - on lantus and humalog at home,  Resume lantus at 5units QHS  Peripheral vascular disease,  -status post bilateral BKA.  DVT prophylaxis: heparin Code Status: full code Family Communication:  none Disposition Plan: home when improved    Consultants:   Nephrology  CM  Procedures:  diffuse tubular narrowing proximal 4 cm of left brachiocephalic AV fistula 42% stenosis angioplasty   Antimicrobials:   none    Subjective: Feels ok, no issues  Objective: Vitals:   06/26/16 1204 06/26/16 1942 06/27/16 0604 06/27/16 1213  BP: (!) 157/82 (!) 165/72 (!) 151/55 (!) 165/63  Pulse: 74 79 72 75  Resp: 18 18 18 18   Temp: 98.2 F (36.8 C) 98.7 F (37.1 C) 99.1 F (37.3 C) 98.6 F (37 C)  TempSrc: Oral Oral Oral Oral  SpO2: 95% 97% 95% 96%  Weight:   110.5 kg (243 lb 11.2 oz)   Height:        Intake/Output Summary (Last 24 hours) at 06/27/16 1303 Last data filed at 06/27/16 1251  Gross per 24 hour  Intake              600 ml  Output             3075 ml  Net            -2475 ml   Filed Weights   06/25/16 1148 06/26/16 0515 06/27/16 0604  Weight: 111 kg (244 lb 11.4 oz) 110.8 kg (244 lb 4.8 oz) 110.5 kg (243 lb 11.2 oz)    Examination:  General exam: Appears calm and comfortable, no distress, pleasant Respiratory system: Respiratory effort normal. No wheezing, rhonchi or rales Cardiovascular system: S1 & S2 heard, RRR. No JVD, murmurs, rubs, gallops or clicks. No pedal edema. Gastrointestinal system: Abdomen is nondistended, soft and nontender. No organomegaly or masses felt. Normal bowel sounds heard. Central nervous system: Alert and  oriented. No focal neurological deficits. Extremities: bilateral BKA's, LUE with AVF Skin: scattered scaling erythema rash on upper extremities and lower extremities bilaterally Psychiatry: Judgement and insight appear normal. Mood & affect appropriate.     Data Reviewed: I have personally reviewed following labs and imaging studies  CBC:  Recent Labs Lab 06/21/16 1249 06/22/16 1525 06/24/16 0257 06/25/16 0848  WBC 5.6 7.5 6.8 8.0  NEUTROABS  --  5.7  --   --   HGB 8.3* 8.9* 8.1* 8.1*  HCT 25.4* 27.5* 25.9* 26.0*   MCV 92.0 93.2 92.2 92.2  PLT 171 191 177 993   Basic Metabolic Panel:  Recent Labs Lab 06/21/16 0422 06/22/16 0528 06/23/16 0711 06/24/16 0257 06/25/16 0849  NA 142 141  --  142 142  K 4.7 4.2  --  4.3 3.9  CL 106 103  --  102 102  CO2 25 28  --  28 27  GLUCOSE 140* 134*  --  184* 122*  BUN 132* 97*  --  101* 93*  CREATININE 6.27* 5.01*  --  5.44* 5.55*  CALCIUM 8.4* 8.4*  --  8.4* 8.5*  PHOS 6.5*  --  4.7*  --  4.9*   GFR: Estimated Creatinine Clearance: 17.5 mL/min (A) (by C-G formula based on SCr of 5.55 mg/dL (H)). Liver Function Tests:  Recent Labs Lab 06/21/16 0422 06/25/16 0849  ALBUMIN 3.4* 3.1*   No results for input(s): LIPASE, AMYLASE in the last 168 hours. No results for input(s): AMMONIA in the last 168 hours. Coagulation Profile:  Recent Labs Lab 06/24/16 0257  INR 1.22   Cardiac Enzymes: No results for input(s): CKTOTAL, CKMB, CKMBINDEX, TROPONINI in the last 168 hours. BNP (last 3 results) No results for input(s): PROBNP in the last 8760 hours. HbA1C:  Recent Labs  06/26/16 1111  HGBA1C 5.4   CBG:  Recent Labs Lab 06/26/16 1120 06/26/16 1623 06/26/16 2132 06/27/16 0749 06/27/16 1212  GLUCAP 204* 152* 187* 151* 200*   Lipid Profile: No results for input(s): CHOL, HDL, LDLCALC, TRIG, CHOLHDL, LDLDIRECT in the last 72 hours. Thyroid Function Tests: No results for input(s): TSH, T4TOTAL, FREET4, T3FREE, THYROIDAB in the last 72 hours. Anemia Panel: No results for input(s): VITAMINB12, FOLATE, FERRITIN, TIBC, IRON, RETICCTPCT in the last 72 hours. Sepsis Labs: No results for input(s): PROCALCITON, LATICACIDVEN in the last 168 hours.  Recent Results (from the past 240 hour(s))  MRSA PCR Screening     Status: None   Collection Time: 06/19/16  4:31 AM  Result Value Ref Range Status   MRSA by PCR NEGATIVE NEGATIVE Final    Comment:        The GeneXpert MRSA Assay (FDA approved for NASAL specimens only), is one component of  a comprehensive MRSA colonization surveillance program. It is not intended to diagnose MRSA infection nor to guide or monitor treatment for MRSA infections.          Radiology Studies: No results found.      Scheduled Meds: . amLODipine  5 mg Oral Daily  . aspirin EC  81 mg Oral Daily  . calcium acetate  667 mg Oral BID WC  . carvedilol  25 mg Oral BID  . cloNIDine  0.1 mg Oral QHS  . clopidogrel  75 mg Oral Daily  . darbepoetin (ARANESP) injection - DIALYSIS  150 mcg Intravenous Q Sat-HD  . ferric gluconate (FERRLECIT/NULECIT) IV  125 mg Intravenous Daily  . furosemide  80 mg Oral BID  .  heparin  5,000 Units Subcutaneous Q8H  . hydrocerin   Topical BID  . insulin aspart  0-9 Units Subcutaneous TID WC  . multivitamin  1 tablet Oral QHS  . ofloxacin  1 drop Right Eye BID  . prednisoLONE acetate  1 drop Right Eye Q2H while awake  . rosuvastatin  10 mg Oral Daily  . sodium chloride flush  3 mL Intravenous Q12H   Continuous Infusions:   LOS: 8 days    Time spent: 35 minutes    Domenic Polite, MD [facility] Pager [phone]  If 7PM-7AM, please contact night-coverage [email or web address] Password [email or web address] 06/27/2016, 1:03 PM

## 2016-06-27 NOTE — Progress Notes (Signed)
[facility] Rounding Note  Subjective:   Seen in HD  Objective Vital signs in last 24 hours: Vitals:   06/27/16 1430 06/27/16 1500 06/27/16 1530 06/27/16 1600  BP: (!) 153/72 121/64 (!) 132/45 (!) 143/80  Pulse: 78 70 70 70  Resp: 16 16 17 16   Temp:      TempSrc:      SpO2:      Weight:      Height:       Weight change: -0.658 kg (-1 lb 7.2 oz)  Intake/Output Summary (Last 24 hours) at 06/27/16 1647 Last data filed at 06/27/16 1251  Gross per 24 hour  Intake              600 ml  Output             2525 ml  Net            -1925 ml   Physical Exam: General: obese,very nice AAM, NAD Seen on HD Regular rhythm Normal ht sounds Lungs few base crackles Abdomen: obese, soft, non tender Extremities: bilat AKAs with pitting edema in both Dialysis Access: left upper arm AVF- accessed for HD and running at BFR 250   Recent Labs Lab 06/23/16 0711 06/24/16 0257 06/25/16 0849 06/27/16 1330  NA  --  142 142 140  K  --  4.3 3.9 4.1  CL  --  102 102 101  CO2  --  28 27 28   GLUCOSE  --  184* 122* 219*  BUN  --  101* 93* 59*  CREATININE  --  5.44* 5.55* 4.91*  CALCIUM  --  8.4* 8.5* 8.3*  PHOS 4.7*  --  4.9* 3.6     Recent Labs Lab 06/21/16 0422 06/25/16 0849 06/27/16 1330  ALBUMIN 3.4* 3.1* 3.0*     Recent Labs Lab 06/21/16 1249 06/22/16 1525 06/24/16 0257 06/25/16 0848 06/27/16 1330  WBC 5.6 7.5 6.8 8.0 10.2  NEUTROABS  --  5.7  --   --   --   HGB 8.3* 8.9* 8.1* 8.1* 8.4*  HCT 25.4* 27.5* 25.9* 26.0* 27.5*  MCV 92.0 93.2 92.2 92.2 94.5  PLT 171 191 177 183 185    Recent Labs Lab 06/26/16 1120 06/26/16 1623 06/26/16 2132 06/27/16 0749 06/27/16 1212  GLUCAP 204* 152* 187* 151* 200*    Scheduled Medications: . acetaminophen      . amLODipine  5 mg Oral Daily  . aspirin EC  81 mg Oral Daily  . calcium acetate  667 mg Oral BID WC  . carvedilol  25 mg Oral BID  . cloNIDine  0.1 mg Oral QHS  . clopidogrel  75 mg Oral Daily  . darbepoetin (ARANESP)  injection - DIALYSIS  150 mcg Intravenous Q Sat-HD  . ferric gluconate (FERRLECIT/NULECIT) IV  125 mg Intravenous Daily  . furosemide  80 mg Oral BID  . heparin  5,000 Units Subcutaneous Q8H  . hydrocerin   Topical BID  . insulin aspart  0-9 Units Subcutaneous TID WC  . insulin glargine  5 Units Subcutaneous QHS  . multivitamin  1 tablet Oral QHS  . ofloxacin  1 drop Right Eye BID  . prednisoLONE acetate  1 drop Right Eye Q2H while awake  . rosuvastatin  10 mg Oral Daily  . sodium chloride flush  3 mL Intravenous Q12H    Assessment/ Plan: Pt is a 61 y.o. yo male who was admitted to [facility] on 06/18/2016 with SOB as well as advanced  CKD- got initiated on HD as inpt at [facility]- only had one treatment then had access dysfunction limiting further HD, transferred here to get AVF "accessible"  1. Vascular access- dysfunction s/p fistulogram and angioplasty 3/23. Have been able to use twice now (including today)  Running at BFR 250 no issues right now 2. ESRD - new start to HD- 3rd successful TMT today. Needs outpt HD at [facility] in [place] - contact Dr. Rhona Leavens secretary tomorrow for details of outpt arrangements.  3. Anemia- hgb 8- giving iron (Ferrlecit load) and ESA (Aranesp 150 QSat) 4. Secondary hyperparathyroidism- phos OK on phoslo-  PTH 97 no calcitriol needed 5. HTN/volume- BP was slightly high- clonidine 0.1TID, coreg 25 BID, norvasc 10 and lasix 80 BID- makes actually lots of urine so will keepon lasix- BP will likely become easier to control once gets established on HD- BP a little low with HD so weaning clonidine and decreasing norvasc - hoping for 2 L UF today. Still pretty sig volume overload.  Jamal Maes, MD [facility] [phone] Pager 06/27/2016, 4:55 PM

## 2016-06-27 NOTE — Procedures (Signed)
I have personally attended this patient's dialysis session.   UF goal 2L AVF 17 ga needles, running at BFR 250 Access pressures OK  Jamal Maes, MD [place] Pager 06/27/2016, 5:00 PM

## 2016-06-28 LAB — GLUCOSE, CAPILLARY
Glucose-Capillary: 155 mg/dL — ABNORMAL HIGH (ref 65–99)
Glucose-Capillary: 197 mg/dL — ABNORMAL HIGH (ref 65–99)
Glucose-Capillary: 216 mg/dL — ABNORMAL HIGH (ref 65–99)
Glucose-Capillary: 188 mg/dL — ABNORMAL HIGH (ref 65–99)

## 2016-06-28 LAB — CBC
HEMATOCRIT: 27.5 % — AB (ref 39.0–52.0)
Hemoglobin: 8.4 g/dL — ABNORMAL LOW (ref 13.0–17.0)
MCH: 29.3 pg (ref 26.0–34.0)
MCHC: 30.5 g/dL (ref 30.0–36.0)
MCV: 95.8 fL (ref 78.0–100.0)
Platelets: 177 10*3/uL (ref 150–400)
RBC: 2.87 MIL/uL — ABNORMAL LOW (ref 4.22–5.81)
RDW: 14 % (ref 11.5–15.5)
WBC: 11 10*3/uL — ABNORMAL HIGH (ref 4.0–10.5)

## 2016-06-28 MED ORDER — DARBEPOETIN ALFA 150 MCG/0.3ML IJ SOSY: 150.0000 ug | PREFILLED_SYRINGE | INTRAMUSCULAR | Status: DC

## 2016-06-28 MED ORDER — INSULIN GLARGINE 100 UNIT/ML SOLOSTAR PEN: 5.0000 [IU] | PEN_INJECTOR | Freq: Every day | SUBCUTANEOUS | Status: DC

## 2016-06-28 MED ORDER — BENZONATATE 100 MG PO CAPS
100.0000 mg | ORAL_CAPSULE | Freq: Two times a day (BID) | ORAL | Status: DC
  Administered 2016-06-28 (×2): 100 mg via ORAL
  Filled 2016-06-28: qty 1

## 2016-06-28 MED ORDER — CLONIDINE HCL 0.1 MG PO TABS
0.1000 mg | ORAL_TABLET | Freq: Every day | ORAL | Status: DC
Start: 2016-06-28 — End: 2022-06-07

## 2016-06-28 NOTE — Evaluation (Signed)
Physical Therapy Evaluation Patient Details Name: [name] MRN: [id number] DOB: January 16, 1956 Today's Date: 06/28/2016   History of Present Illness  Pt is is a 61 y.o. male with a h/o of DM HTN advanced CKD, PAD w/ B/L BKA's admitted 06/18/16 with shortness of breath over a few days + increased swelling + nonproductive cough. He began HD on dialysis on 3/20 and was transferred to [facility] after his port infiltrated.  Clinical Impression  Pt able to perform transfers only prior to admission.  He has all needed DME and is independent from w/c level for instrumental ADLs.  Will follow pt's progress during hospital stay to progress mobility.    Follow Up Recommendations No PT follow up    Equipment Recommendations  None recommended by PT    Recommendations for Other Services       Precautions / Restrictions Precautions Precautions: None Restrictions Weight Bearing Restrictions: No      Mobility  Bed Mobility Overal bed mobility: Independent             General bed mobility comments: supine to sit  Transfers Overall transfer level: Needs assistance Equipment used: None Transfers: Lateral/Scoot Transfers          Lateral/Scoot Transfers: Min guard General transfer comment: between bed to recliner chair with armrest lowered  Ambulation/Gait                Stairs            Wheelchair Mobility    Modified Rankin (Stroke Patients Only)       Balance Overall balance assessment: Independent (sitting balance without UE support, unable to stand)                                           Pertinent Vitals/Pain Pain Assessment: No/denies pain    Home Living Family/patient expects to be discharged to:: Private residence Living Arrangements: Alone Available Help at Discharge: Available PRN/intermittently;Friend(s) Type of Home: Apartment Home Access: Elevator     Home Layout: One level Home Equipment: Environmental consultant - 2 wheels;Tub  bench;Wheelchair - Education officer, community - power ([name] was given to him by friend, currently not in working ord) Additional Comments: bil prostheses made by [employer], not worn in >1 yr secondary to leg swelling.    Prior Function Level of Independence: Independent with assistive device(s)         Comments: scoot transfers independently without device     Hand Dominance        Extremity/Trunk Assessment   Upper Extremity Assessment Upper Extremity Assessment: Overall WFL for tasks assessed    Lower Extremity Assessment Lower Extremity Assessment: RLE deficits/detail;LLE deficits/detail RLE Deficits / Details: TTA, scarring to distal thigh LLE Deficits / Details: TTA, scarrring to distal thigh    Cervical / Trunk Assessment Cervical / Trunk Assessment: Normal  Communication   Communication: No difficulties  Cognition Arousal/Alertness: Awake/alert Behavior During Therapy: WFL for tasks assessed/performed Overall Cognitive Status: Within Functional Limits for tasks assessed                                        General Comments General comments (skin integrity, edema, etc.): heavy scarring all limbs including UEs along wrists, hands and elbows    Exercises     Assessment/Plan  PT Assessment Patient needs continued PT services  PT Problem List Decreased mobility;Cardiopulmonary status limiting activity;Decreased skin integrity       PT Treatment Interventions DME instruction;Functional mobility training;Therapeutic activities;Therapeutic exercise;Wheelchair mobility training    PT Goals (Current goals can be found in the Care Plan section)  Acute Rehab PT Goals Patient Stated Goal: get transportation services set up once I get home PT Goal Formulation: With patient Time For Goal Achievement: 07/08/16 Potential to Achieve Goals: Good    Frequency Min 2X/week   Barriers to discharge Decreased caregiver support      Co-evaluation                End of Session   Activity Tolerance: Patient tolerated treatment well Patient left: in chair;with call bell/phone within reach Nurse Communication: Mobility status PT Visit Diagnosis: Muscle weakness (generalized) (M62.81)    Time: 4076-8088 PT Time Calculation (min) (ACUTE ONLY): 16 min   Charges:   PT Evaluation $PT Eval Low Complexity: 1 Procedure     PT G CodesSuszanne Finch, PT [phone]  [place] 06/28/2016, 11:32 AM

## 2016-06-28 NOTE — Progress Notes (Signed)
Notified by Dr Buelah Manis that patient is clipped for start of HD on Friday. Patient will require HD here tomorrow prior to DC. Patient with order for [facility], requested Jermaine with  [facility] to deliver to room prior to DC tomorrow. Update Clarise Cruz CSW of plan, and patient's request to be updated on SCAT application and transportation set up for HD Friday.

## 2016-06-28 NOTE — Clinical Social Work Note (Signed)
CSW met with patient to discuss transportation options to dialysis at [facility] in [place]. Patient does not need to fill out an application for SCAT. He only needs to show up at the bus stop. CSW printed off and provided all information on routes, payment for trips, how to use, etc. Patient stated that this was not a good option for him because it takes too long. The patient states that the bus stop is right by his apartment but it takes an hour to get to the facility. The patient's family work schedules are not constant so he often does not have anyone that could drive him. Patient requested that CSW call [facility]. They only bill Medicaid and it costs $45 round trip per day out of pocket. Since patient will need the service multiple times a week they might be willing to charge $25-$35 per day. CSW notified patient. He states that he does not qualify for Medicaid because he makes too much money. CSW called Education officer, museum at [employer] to discuss. She stated that [facility] would be an option if he did end up getting Medicaid but unfortunately there is limited funding a long wait list. For example, she had a patient that was #2 on the list for two years. Per [facility] transportation staff, there are currently 6 people on the wait list already. SCAT may be the patient's only option at this time unless he wants to pay for a cab. He says he only lives 3-4 miles from the HD facility.  Dayton Scrape, [place]

## 2016-06-28 NOTE — Care Management Important Message (Signed)
Important Message  Patient Details  Name: [name] MRN: [id number] Date of Birth: [birth year]   Medicare Important Message Given:  Yes    Alfonse Garringer Montine Circle 06/28/2016, 11:38 AM

## 2016-06-28 NOTE — Progress Notes (Signed)
PROGRESS NOTE    [name]  UMP:[id number] DOB: [birth year] DOA: 06/18/2016 PCP: [name], MD    Brief Narrative:  61 year old man PMH diabetes mellitus, bilateral BKA, AICD, CHF-EF 20%, C KD with AV fistula placed but not on dialysis, presented with increasing swelling, weight gain and shortness of breath. Admitted for hypertensive urgency, CHF exacerbation, volume overload, acute hypoxic respiratory failure. Has subjectively improved with diuresis and acute hypoxia has resolved although renal function continues to worsen. Nephrology following. Patient was started on HD on 3/20.  On 3/21 his dialysis was stopped secondary to infiltration and when needle was withdrawn on both dialysis attempts clots were expressed.  He was transferred to [facility] for evaluation by vascular surgery and nephrology.  Assessment & Plan:   Acute hypoxic respiratory failure secondary to Volume overload CKD5 - improved, following first HD on 3/20 -repeat HD 3/24, Renal following, vol status stable at this time  AKI on CKD4 /New ESRD - superimposed on chronic kidney disease stage IV - Started on HD on 3/21 - Patient fistula clotted and infiltrated on 3/21 - appreciate VVS consult, s/p Fistulogram/PTA 3/23 - Await clip and needs SCAT arranged prior to discharge  Anemia of chronic disease -on Epo an Iron per Renal -stable at 8  NICM/Cardiomyopathy previously EF 15-20%. Repeat ECHO with EF of 35-40% -EF 35-40%, volume managed with HD now -s/p AICD, lost to follow up with [facility]  Hypertensive urgency, accelerated hypertension - resolved. - Cut down clonidine, stopped Aldactone, continue Coreg - stopped amlodipine too, doubt will need lasix at discharge  Diabetes mellitus - Remains stable. Continue sliding scale insulin. - on high dose lantus and humalog at home,  Resumed at lantus at 5units QHS, stable, monitor  Peripheral vascular disease,  -status post bilateral BKA.  DVT prophylaxis:  heparin Code Status: full code Family Communication: none Disposition Plan: home when CLIP for HD completed    Consultants:   Nephrology  VVS  Procedures:  diffuse tubular narrowing proximal 4 cm of left brachiocephalic AV fistula 40% stenosis angioplasty   Antimicrobials:   none    Subjective: Feels ok, no issues, wanting to speak to CSW abt SCAT  Objective: Vitals:   06/27/16 2100 06/28/16 0604 06/28/16 0838 06/28/16 1255  BP: (!) 148/57 (!) 148/50 (!) 155/65 (!) 145/60  Pulse: 82 75 72 75  Resp: 18 18 18 18   Temp: 97.8 F (36.6 C) 98.5 F (36.9 C) 99.4 F (37.4 C) 99.2 F (37.3 C)  TempSrc: Oral Oral Oral Oral  SpO2: 95% 98% 96% 97%  Weight:  110.2 kg (242 lb 15.2 oz)    Height:        Intake/Output Summary (Last 24 hours) at 06/28/16 1328 Last data filed at 06/28/16 0900  Gross per 24 hour  Intake              720 ml  Output             2200 ml  Net            -1480 ml   Filed Weights   06/27/16 1348 06/27/16 1800 06/28/16 0604  Weight: 111 kg (244 lb 11.4 oz) 109 kg (240 lb 4.8 oz) 110.2 kg (242 lb 15.2 oz)    Examination:  General exam: Appears calm and comfortable, no distress, pleasant Respiratory system: Respiratory effort normal. No wheezing, rhonchi or rales Cardiovascular system: S1 & S2 heard, RRR. No JVD, murmurs, rubs, gallops or clicks. No pedal edema. Gastrointestinal  system: Abdomen is nondistended, soft and nontender. No organomegaly or masses felt. Normal bowel sounds heard. Central nervous system: Alert and oriented. No focal neurological deficits. Extremities: bilateral BKA's, LUE with AVF Skin: scattered scaling erythema rash on upper extremities and lower extremities bilaterally Psychiatry: Judgement and insight appear normal. Mood & affect appropriate.     Data Reviewed: I have personally reviewed following labs and imaging studies  CBC:  Recent Labs Lab 06/22/16 1525 06/24/16 0257 06/25/16 0848 06/27/16 1330  06/28/16 0421  WBC 7.5 6.8 8.0 10.2 11.0*  NEUTROABS 5.7  --   --   --   --   HGB 8.9* 8.1* 8.1* 8.4* 8.4*  HCT 27.5* 25.9* 26.0* 27.5* 27.5*  MCV 93.2 92.2 92.2 94.5 95.8  PLT 191 177 183 185 829   Basic Metabolic Panel:  Recent Labs Lab 06/22/16 0528 06/23/16 0711 06/24/16 0257 06/25/16 0849 06/27/16 1330  NA 141  --  142 142 140  K 4.2  --  4.3 3.9 4.1  CL 103  --  102 102 101  CO2 28  --  28 27 28   GLUCOSE 134*  --  184* 122* 219*  BUN 97*  --  101* 93* 59*  CREATININE 5.01*  --  5.44* 5.55* 4.91*  CALCIUM 8.4*  --  8.4* 8.5* 8.3*  PHOS  --  4.7*  --  4.9* 3.6   GFR: Estimated Creatinine Clearance: 19.7 mL/min (A) (by C-G formula based on SCr of 4.91 mg/dL (H)). Liver Function Tests:  Recent Labs Lab 06/25/16 0849 06/27/16 1330  ALBUMIN 3.1* 3.0*   No results for input(s): LIPASE, AMYLASE in the last 168 hours. No results for input(s): AMMONIA in the last 168 hours. Coagulation Profile:  Recent Labs Lab 06/24/16 0257  INR 1.22   Cardiac Enzymes: No results for input(s): CKTOTAL, CKMB, CKMBINDEX, TROPONINI in the last 168 hours. BNP (last 3 results) No results for input(s): PROBNP in the last 8760 hours. HbA1C:  Recent Labs  06/26/16 1111  HGBA1C 5.4   CBG:  Recent Labs Lab 06/27/16 0749 06/27/16 1212 06/27/16 2124 06/28/16 0747 06/28/16 1203  GLUCAP 151* 200* 317* 155* 197*   Lipid Profile: No results for input(s): CHOL, HDL, LDLCALC, TRIG, CHOLHDL, LDLDIRECT in the last 72 hours. Thyroid Function Tests: No results for input(s): TSH, T4TOTAL, FREET4, T3FREE, THYROIDAB in the last 72 hours. Anemia Panel: No results for input(s): VITAMINB12, FOLATE, FERRITIN, TIBC, IRON, RETICCTPCT in the last 72 hours. Sepsis Labs: No results for input(s): PROCALCITON, LATICACIDVEN in the last 168 hours.  Recent Results (from the past 240 hour(s))  MRSA PCR Screening     Status: None   Collection Time: 06/19/16  4:31 AM  Result Value Ref Range Status    MRSA by PCR NEGATIVE NEGATIVE Final    Comment:        The GeneXpert MRSA Assay (FDA approved for NASAL specimens only), is one component of a comprehensive MRSA colonization surveillance program. It is not intended to diagnose MRSA infection nor to guide or monitor treatment for MRSA infections.          Radiology Studies: No results found.      Scheduled Meds: . aspirin EC  81 mg Oral Daily  . calcium acetate  667 mg Oral BID WC  . carvedilol  25 mg Oral BID  . cloNIDine  0.1 mg Oral QHS  . clopidogrel  75 mg Oral Daily  . darbepoetin (ARANESP) injection - DIALYSIS  150 mcg Intravenous  Q Sat-HD  . ferric gluconate (FERRLECIT/NULECIT) IV  125 mg Intravenous Daily  . furosemide  80 mg Oral BID  . heparin  5,000 Units Subcutaneous Q8H  . hydrocerin   Topical BID  . insulin aspart  0-9 Units Subcutaneous TID WC  . insulin glargine  5 Units Subcutaneous QHS  . multivitamin  1 tablet Oral QHS  . ofloxacin  1 drop Right Eye BID  . prednisoLONE acetate  1 drop Right Eye Q2H while awake  . rosuvastatin  10 mg Oral Daily  . sodium chloride flush  3 mL Intravenous Q12H   Continuous Infusions:   LOS: 9 days    Time spent: 35 minutes    Domenic Polite, MD [facility] Pager [phone]  If 7PM-7AM, please contact night-coverage [email or web address] Password [email or web address] 06/28/2016, 1:28 PM

## 2016-06-28 NOTE — Progress Notes (Signed)
[facility] Rounding Note  Subjective:   HD yesterday no issues Has outpt spot at [facility] and can start on Friday Says needs assistance with transportation and a new wheelchair - I have called [facility]  Objective Vital signs in last 24 hours: Vitals:   06/27/16 2100 06/28/16 0604 06/28/16 0838 06/28/16 1255  BP: (!) 148/57 (!) 148/50 (!) 155/65 (!) 145/60  Pulse: 82 75 72 75  Resp: 18 18 18 18   Temp: 97.8 F (36.6 C) 98.5 F (36.9 C) 99.4 F (37.4 C) 99.2 F (37.3 C)  TempSrc: Oral Oral Oral Oral  SpO2: 95% 98% 96% 97%  Weight:  110.2 kg (242 lb 15.2 oz)    Height:       Weight change: 0.458 kg (1 lb 0.2 oz)  Intake/Output Summary (Last 24 hours) at 06/28/16 1421 Last data filed at 06/28/16 0900  Gross per 24 hour  Intake              720 ml  Output             2200 ml  Net            -1480 ml   Physical Exam: General: obese,very nice AAM, NAD Sitting up eating lunch Regular rhythm  Normal ht sounds Lungs grossly clear Abdomen: obese, soft, non tender Extremities: bilat AKAs with pitting edema in both Dialysis Access: left upper arm AVF + bruit   Recent Labs Lab 06/23/16 0711 06/24/16 0257 06/25/16 0849 06/27/16 1330  NA  --  142 142 140  K  --  4.3 3.9 4.1  CL  --  102 102 101  CO2  --  28 27 28   GLUCOSE  --  184* 122* 219*  BUN  --  101* 93* 59*  CREATININE  --  5.44* 5.55* 4.91*  CALCIUM  --  8.4* 8.5* 8.3*  PHOS 4.7*  --  4.9* 3.6     Recent Labs Lab 06/25/16 0849 06/27/16 1330  ALBUMIN 3.1* 3.0*     Recent Labs Lab 06/22/16 1525 06/24/16 0257 06/25/16 0848 06/27/16 1330 06/28/16 0421  WBC 7.5 6.8 8.0 10.2 11.0*  NEUTROABS 5.7  --   --   --   --   HGB 8.9* 8.1* 8.1* 8.4* 8.4*  HCT 27.5* 25.9* 26.0* 27.5* 27.5*  MCV 93.2 92.2 92.2 94.5 95.8  PLT 191 177 183 185 177    Recent Labs Lab 06/27/16 0749 06/27/16 1212 06/27/16 2124 06/28/16 0747 06/28/16 1203  GLUCAP 151* 200* 317* 155* 197*    Scheduled Medications: . aspirin  EC  81 mg Oral Daily  . calcium acetate  667 mg Oral BID WC  . carvedilol  25 mg Oral BID  . cloNIDine  0.1 mg Oral QHS  . clopidogrel  75 mg Oral Daily  . darbepoetin (ARANESP) injection - DIALYSIS  150 mcg Intravenous Q Sat-HD  . ferric gluconate (FERRLECIT/NULECIT) IV  125 mg Intravenous Daily  . furosemide  80 mg Oral BID  . heparin  5,000 Units Subcutaneous Q8H  . hydrocerin   Topical BID  . insulin aspart  0-9 Units Subcutaneous TID WC  . insulin glargine  5 Units Subcutaneous QHS  . multivitamin  1 tablet Oral QHS  . ofloxacin  1 drop Right Eye BID  . prednisoLONE acetate  1 drop Right Eye Q2H while awake  . rosuvastatin  10 mg Oral Daily  . sodium chloride flush  3 mL Intravenous Q12H    Assessment/  Plan: Pt is a 61 y.o. yo male who was admitted to [facility] on 06/18/2016 with SOB as well as advanced CKD- got initiated on HD as inpt at [facility]- only had one treatment then had access dysfunction limiting further HD, transferred here to get AVF "accessible"  1. Vascular access- dysfunction s/p fistulogram and angioplasty 3/23. Have been able to use twice now.  Running at BFR 250 no issues right now. Can have BFR and needle gauge titrated up as outpt. 2. ESRD - new start to HD- 3rd successful TMT 3/26. HD #4 tomorrow. Outpt HD at [facility] in [place] MWF 2nd shift can start there on Friday.  HD here tomorrow. EDW not yet established.  3. Anemia- Hgb 8- giving iron (Ferrlecit load) and ESA (Aranesp 150 QFri) 4. Secondary hyperparathyroidism- phos OK on phoslo -  PTH 97 no calcitriol needed 5. HTN/volume- makes actually lots of urine so have kept on lasix. Some meds weaned. Need room for UF as still pretty sig volume overload.  Jamal Maes, MD [facility] [phone] Pager 06/28/2016, 2:21 PM

## 2016-06-28 NOTE — Discharge Summary (Signed)
Physician Discharge Summary  [name] LKT:[id number] DOB: 02/12/1956 DOA: 06/18/2016  PCP: [name], MD  Admit date: 06/18/2016 Discharge date: 06/28/2016  Time spent: 35 minutes  Recommendations for Outpatient Follow-up:  1. PCP in 1 week, please titrate BP meds and Insulin dose based on CBGs, BP trend 2. Start Hemodialysis [facility]   Discharge Diagnoses:  Principal Problem:   Pulmonary edema   New ESRD   Acute on chronic systolic and diastolic CHF   Essential hypertension   Bilateral BKA   Peripheral vascular disease (HCC)   Chronic kidney disease (CKD), stage IV (severe) (HCC)   Acute hypoxemic respiratory failure (HCC)   Volume overload   AKI (acute kidney injury) ([place])   DM (diabetes mellitus), type 2 with peripheral vascular complications [facility])   Discharge Condition: stable  Diet recommendation: Renal Diabetic  Filed Weights   06/27/16 1348 06/27/16 1800 06/28/16 0604  Weight: 111 kg (244 lb 11.4 oz) 109 kg (240 lb 4.8 oz) 110.2 kg (242 lb 15.2 oz)    History of present illness:  61 year old man PMH diabetes mellitus, bilateral BKA, AICD, CHF-EF 20%, KD 4 with AV fistula placed but not on dialysis, presented with increasing swelling, weight gain and shortness of breath. Admitted for hypertensive urgency, CHF exacerbation, volume overload, acute hypoxic respiratory failure  Hospital Course:   Acute hypoxic respiratory failure/Pulm edema secondary to Volume overload CKD5 -  Started hemodialysis this admission-first HD on 3/20 - improved  AKI on CKD4 /New ESRD - superimposed on chronic kidney disease stage IV - Started on HD on 3/21, pt had a AVF previously, but this fistula clotted and infiltrated on 3/21on his second treatment and hence transferred to [facility], VVS consulted, s/p Fistulogram/PTA 3/23, now stable and tolerating HD  - Clipped for [facility] to start on Friday, will discharge after HD Wednesday and CM/CSW working on transportation to  HD unit   Anemia of chronic disease -started on Epo an Iron per Renal -stable at 8-8.5 range  NICM/Cardiomyopathy previously EF 15-20%. Repeat ECHO with EF of 35-40% -EF 35-40%, volume managed with HD now -s/p AICD, lost to follow up with [facility] -recommended cards FU  Hypertensive urgency, accelerated hypertension - resolved. - Cut down clonidine, stopped Aldactone, and amlodipine - continue Coreg and lasix since still making urine   Diabetes mellitus. - on high dose lantus and humalog at home, noted to require very little insulin inpatient, resumed at lantus at 5units QHS, stable, will need dose titration as outpatient  Peripheral vascular disease,  -status post bilateral BKA  Consultants:   Nephrology  VVS  Procedures:  FIstulogram:diffuse tubular narrowing proximal 4 cm of left brachiocephalic AV fistula 34% stenosis angioplasty   Discharge Exam: Vitals:   06/28/16 1255 06/28/16 2000  BP: (!) 145/60 116/72  Pulse: 75 78  Resp: 18 18  Temp: 99.2 F (37.3 C) 98.1 F (36.7 C)    General: AAOx3 Cardiovascular: S1S2/RRR Respiratory: decreased BS at bases  Discharge Instructions   Discharge Instructions    Discharge instructions    Complete by:  As directed    Renal Diabetic diet   Increase activity slowly    Complete by:  As directed      Current Discharge Medication List    CONTINUE these medications which have CHANGED   Details  cloNIDine (CATAPRES) 0.1 MG tablet Take 1 tablet (0.1 mg total) by mouth at bedtime.    Insulin Glargine (LANTUS SOLOSTAR) 100 UNIT/ML Solostar Pen Inject 5 Units  into the skin at bedtime.      CONTINUE these medications which have NOT CHANGED   Details  acetaminophen (TYLENOL) 500 MG tablet Take 1,000 mg by mouth daily as needed for mild pain or headache.     aspirin 81 MG tablet Take 81 mg by mouth daily.      brimonidine-timolol (COMBIGAN) 0.2-0.5 % ophthalmic solution Place 1 drop into the right eye  every 12 (twelve) hours.    calcium acetate (PHOSLO) 667 MG capsule Take 667 mg by mouth daily. With largest meal of the day Refills: 0    carvedilol (COREG) 25 MG tablet Take 1 tablet (25 mg total) by mouth 2 (two) times daily. Qty: 60 tablet, Refills: 0    clopidogrel (PLAVIX) 75 MG tablet Take 75 mg by mouth daily.      fenofibrate 160 MG tablet Take 160 mg by mouth daily. Refills: 0    folic acid (FOLVITE) 1 MG tablet Take 1 mg by mouth daily.    furosemide (LASIX) 20 MG tablet Take 80 mg by mouth 2 (two) times daily.     ofloxacin (OCUFLOX) 0.3 % ophthalmic solution Place 1 drop into the right eye 2 (two) times daily.    oxyCODONE (OXY IR/ROXICODONE) 5 MG immediate release tablet Take 1 tablet (5 mg total) by mouth at bedtime as needed for moderate pain. Qty: 30 tablet, Refills: 0    prednisoLONE acetate (PRED FORTE) 1 % ophthalmic suspension Place 1 drop into the right eye every 2 (two) hours while awake.    rosuvastatin (CRESTOR) 20 MG tablet Take 20 mg by mouth daily. Refills: 0    traMADol (ULTRAM) 50 MG tablet Take 1 tablet (50 mg total) by mouth every 6 (six) hours as needed. Qty: 15 tablet, Refills: 0    Vitamin D, Ergocalciferol, (DRISDOL) 50000 UNITS CAPS capsule Take 50,000 Units by mouth once a week. Refills: 0      STOP taking these medications     amLODipine (NORVASC) 10 MG tablet      HUMALOG KWIKPEN 100 UNIT/ML KiwkPen      spironolactone (ALDACTONE) 25 MG tablet        Allergies  Allergen Reactions  . Acetazolamide Other (See Comments)    Jittery odd feeling. (hyper feeling)  . Contrast Media [Iodinated Diagnostic Agents] Hives    In the 80's   . Other Other (See Comments)    Transfer Dye---"Makes me tired"   Follow-up Information    [name], MD. Schedule an appointment as soon as possible for a visit in 1 week(s).   Specialty:  Internal Medicine Contact information: [address] [place] [address] [phone]             The results of significant diagnostics from this hospitalization (including imaging, microbiology, ancillary and laboratory) are listed below for reference.    Significant Diagnostic Studies: Dg Chest 1 View  Result Date: 06/21/2016 CLINICAL DATA:  Congestive heart failure EXAM: CHEST 1 VIEW COMPARISON:  06/18/2016 FINDINGS: Cardiomegaly again noted. Single lead cardiac pacemaker is unchanged in position. Improvement in aeration. Residual mild perihilar interstitial prominence without convincing pulmonary edema. Mild left basilar atelectasis. Stable trace bilateral pleural effusion. IMPRESSION: Improvement in aeration. Residual mild perihilar interstitial prominence without convincing pulmonary edema. Mild left basilar atelectasis. Trace bilateral pleural effusion again noted Electronically Signed   By: Lahoma Crocker M.D.   On: 06/21/2016 09:15   US Renal  Result Date: 06/20/2016 CLINICAL DATA:  Acute kidney injury.  EXAM: RENAL / URINARY TRACT ULTRASOUND COMPLETE COMPARISON:  Renal ultrasound of January 19, 2015. FINDINGS: Right Kidney: Length: 11.9 cm. Echogenicity within normal limits. No mass or hydronephrosis visualized. Left Kidney: Length: 13.1 cm. 4.9 cm simple cyst is noted medially. Echogenicity within normal limits. No mass or hydronephrosis visualized. Bladder: Appears normal for degree of bladder distention. Ureteral jets are not visualized. IMPRESSION: Simple left renal cyst.  No hydronephrosis is noted. Electronically Signed   By: Marijo Conception, M.D.   On: 06/20/2016 10:23   Dg Chest Port 1 View  Result Date: 06/18/2016 CLINICAL DATA:  Shortness of breath increasingly worse over the past 8 days. History of CHF, diabetes, hypertension, stroke, TIA, PVD. EXAM: PORTABLE CHEST 1 VIEW COMPARISON:  09/27/2010 FINDINGS: Cardiac pacemaker. Shallow inspiration. Cardiac enlargement with diffuse pulmonary vascular congestion. Hazy perihilar opacities likely representing early edema.  Probable small right pleural effusion. No focal volume loss or consolidation. No pneumothorax. Calcification of the aorta. IMPRESSION: Likely congestive heart failure with cardiac enlargement, pulmonary vascular congestion, perihilar edema, and small right pleural effusion. Electronically Signed   By: Lucienne Capers M.D.   On: 06/18/2016 23:30    Microbiology: Recent Results (from the past 240 hour(s))  MRSA PCR Screening     Status: None   Collection Time: 06/19/16  4:31 AM  Result Value Ref Range Status   MRSA by PCR NEGATIVE NEGATIVE Final    Comment:        The GeneXpert MRSA Assay (FDA approved for NASAL specimens only), is one component of a comprehensive MRSA colonization surveillance program. It is not intended to diagnose MRSA infection nor to guide or monitor treatment for MRSA infections.      Labs: Basic Metabolic Panel:  Recent Labs Lab 06/22/16 0528 06/23/16 0711 06/24/16 0257 06/25/16 0849 06/27/16 1330  NA 141  --  142 142 140  K 4.2  --  4.3 3.9 4.1  CL 103  --  102 102 101  CO2 28  --  28 27 28   GLUCOSE 134*  --  184* 122* 219*  BUN 97*  --  101* 93* 59*  CREATININE 5.01*  --  5.44* 5.55* 4.91*  CALCIUM 8.4*  --  8.4* 8.5* 8.3*  PHOS  --  4.7*  --  4.9* 3.6   Liver Function Tests:  Recent Labs Lab 06/25/16 0849 06/27/16 1330  ALBUMIN 3.1* 3.0*   No results for input(s): LIPASE, AMYLASE in the last 168 hours. No results for input(s): AMMONIA in the last 168 hours. CBC:  Recent Labs Lab 06/22/16 1525 06/24/16 0257 06/25/16 0848 06/27/16 1330 06/28/16 0421  WBC 7.5 6.8 8.0 10.2 11.0*  NEUTROABS 5.7  --   --   --   --   HGB 8.9* 8.1* 8.1* 8.4* 8.4*  HCT 27.5* 25.9* 26.0* 27.5* 27.5*  MCV 93.2 92.2 92.2 94.5 95.8  PLT 191 177 183 185 177   Cardiac Enzymes: No results for input(s): CKTOTAL, CKMB, CKMBINDEX, TROPONINI in the last 168 hours. BNP: BNP (last 3 results)  Recent Labs  06/18/16 2310  BNP 1,237.0*    ProBNP (last 3  results) No results for input(s): PROBNP in the last 8760 hours.  CBG:  Recent Labs Lab 06/27/16 2124 06/28/16 0747 06/28/16 1203 06/28/16 1654 06/28/16 2111  GLUCAP 317* 155* 197* 216* 188*       SignedDomenic Polite MD.  [facility] 06/28/2016, 10:45 PM

## 2016-06-28 NOTE — Progress Notes (Signed)
Accepted at [facility] Start Date and Time 06/30/16 at 12:30pm Monday,Wednesday,Friday 2nd shift

## 2016-06-29 LAB — RENAL FUNCTION PANEL
ANION GAP: 11 (ref 5–15)
Albumin: 3.1 g/dL — ABNORMAL LOW (ref 3.5–5.0)
BUN: 40 mg/dL — ABNORMAL HIGH (ref 6–20)
CALCIUM: 8.4 mg/dL — AB (ref 8.9–10.3)
CO2: 27 mmol/L (ref 22–32)
Chloride: 102 mmol/L (ref 101–111)
Creatinine, Ser: 4.25 mg/dL — ABNORMAL HIGH (ref 0.61–1.24)
GFR calc Af Amer: 16 mL/min — ABNORMAL LOW (ref >60)
GFR calc non Af Amer: 14 mL/min — ABNORMAL LOW (ref >60)
GLUCOSE: 164 mg/dL — AB (ref 65–99)
POTASSIUM: 4.6 mmol/L (ref 3.5–5.1)
Phosphorus: 3.4 mg/dL (ref 2.5–4.6)
SODIUM: 140 mmol/L (ref 135–145)

## 2016-06-29 LAB — GLUCOSE, CAPILLARY: Glucose-Capillary: 187 mg/dL — ABNORMAL HIGH (ref 65–99)

## 2016-06-29 LAB — CBC
HCT: 28.6 % — ABNORMAL LOW (ref 39.0–52.0)
HEMOGLOBIN: 8.8 g/dL — AB (ref 13.0–17.0)
MCH: 29.2 pg (ref 26.0–34.0)
MCHC: 30.8 g/dL (ref 30.0–36.0)
MCV: 95 fL (ref 78.0–100.0)
Platelets: 243 10*3/uL (ref 150–400)
RBC: 3.01 MIL/uL — ABNORMAL LOW (ref 4.22–5.81)
RDW: 13.7 % (ref 11.5–15.5)
WBC: 11 10*3/uL — ABNORMAL HIGH (ref 4.0–10.5)

## 2016-06-29 NOTE — Plan of Care (Signed)
Problem: Activity: Goal: Capacity to carry out activities will improve Outcome: Progressing Patient able to transfer with supervision and minimal assistance

## 2016-06-29 NOTE — Progress Notes (Signed)
Rolling walker ordered as requested and to be delivered to the room today prior to discharging home. Mindi Slicker [facility] [phone]

## 2016-06-29 NOTE — Procedures (Signed)
I have personally attended this patient's dialysis session.   Pt for discharge today Getting 4th HD (3rd one here) via AVF BFR at 250, using 17 ga needles Can be titrated up as outpt Has had 5 doses of 125 mg Ferrlecit and  rec'd 150 Aranesp on 3/24 with Hb's in the 8's. PTH 97 so no calcitriol has been needed On Ca acetate as binder Still makes urine so have kept lasix on EDW not yet established Will have MWF spot at [facility] to start there on Friday OK for discharge from renal standpoint after HD  Jamal Maes, MD [place] Pager 06/29/2016, 7:43 AM

## 2016-06-29 NOTE — Progress Notes (Signed)
[facility]                                                                              Patient Demographics  [name], is a 61 y.o. male, DOB - July 29, 1955, GUR:[id number]  Admit date - 06/18/2016   Admitting Physician Phillips Grout, MD  Outpatient Primary MD for the patient is [name], MD  Outpatient specialists:   LOS - 10  days    Chief Complaint  Patient presents with  . Shortness of Breath       Brief summary   61 year old man PMH diabetes mellitus, bilateral BKA, AICD, CHF-EF 20%, KD 4 with AV fistula placed but not on dialysis, presented with increasing swelling, weight gain and shortness of breath. Admitted for hypertensive urgency, CHF exacerbation, volume overload, acute hypoxic respiratory failure    Assessment & Plan   Acute hypoxic respiratory failure/Pulm edemasecondary to Volume overload CKD5 -  Started hemodialysis this admission-first HD on 3/20 - improved, Patient receiving hemodialysis today, then plan for discharge to start HD in the outpatient center on Friday.  AKI on CKD4 /New ESRD - superimposed on chronic kidney disease stage IV - Started on HD on 3/21, pt had a AVF previously, but this fistula clotted and infiltrated on 3/21on his second treatment and hence transferred to [facility], VVS consulted, s/p Fistulogram/PTA 3/23, now stable and tolerating HD  - Clipped for [facility] to start on Friday, DC home after HD today  Anemia of chronic disease -started on Epo an Iron per Renal -stable at 8-8.5 range  NICM/Cardiomyopathy previously EF 15-20%. Repeat ECHO with EF of 35-40% -EF 35-40%, volume managed with HD now -s/p AICD, lost to follow up with [facility] -recommended cards FU  Hypertensive urgency, accelerated hypertension - resolved. - Decreased clonidine, stopped Aldactone, and amlodipine - continue Coreg and lasix since still making urine   Diabetes mellitus. - on high dose lantus and humalog at  home, noted to require very little insulin inpatient, resumed atlantus at 5units QHS, stable, will need dose titration as outpatient  Peripheral vascular disease,  -status post bilateral BKA    Code Status: Full CODE STATUS DVT Prophylaxis: * heparin  Family Communication: Discussed in detail with the patient, all imaging results, lab results explained to the patient   Disposition Plan: DC home today  Time Spent in minutes   15 minutes  Procedures:  HD  Consultants:   Nephrology  Antimicrobials:      Medications  Scheduled Meds: . aspirin EC  81 mg Oral Daily  . benzonatate  100 mg Oral BID  . calcium acetate  667 mg Oral BID WC  . carvedilol  25 mg Oral BID  . cloNIDine  0.1 mg Oral QHS  . clopidogrel  75 mg Oral Daily  . [START ON 07/01/2016] darbepoetin (ARANESP) injection - DIALYSIS  150 mcg Intravenous Q Fri-HD  . ferric gluconate (FERRLECIT/NULECIT) IV  125 mg Intravenous Daily  . furosemide  80 mg Oral BID  . heparin  5,000 Units Subcutaneous Q8H  . hydrocerin   Topical BID  . insulin aspart  0-9 Units Subcutaneous TID WC  .  insulin glargine  5 Units Subcutaneous QHS  . multivitamin  1 tablet Oral QHS  . ofloxacin  1 drop Right Eye BID  . prednisoLONE acetate  1 drop Right Eye Q2H while awake  . rosuvastatin  10 mg Oral Daily  . sodium chloride flush  3 mL Intravenous Q12H   Continuous Infusions: PRN Meds:.sodium chloride, sodium chloride, sodium chloride, acetaminophen, alteplase, heparin, lidocaine (PF), lidocaine-prilocaine, ondansetron (ZOFRAN) IV, pentafluoroprop-tetrafluoroeth, sodium chloride flush   Antibiotics   Anti-infectives    None        Subjective:   [name] was seen and examined todayIn the hemodialysis unit, no complaints.* Patient denies dizziness, chest pain, shortness of breath, abdominal pain, N/V/D/C, new weakness, numbess, tingling. No acute events overnight.    Objective:   Vitals:   06/29/16 0830 06/29/16 0900  06/29/16 0930 06/29/16 1000  BP: 102/60 (!) 143/74 123/73 (!) 156/76  Pulse: 66 70 75 75  Resp: 19 14 15 14   Temp:      TempSrc:      SpO2:      Weight:      Height:        Intake/Output Summary (Last 24 hours) at 06/29/16 1038 Last data filed at 06/29/16 0511  Gross per 24 hour  Intake              480 ml  Output             1375 ml  Net             -895 ml     Wt Readings from Last 3 Encounters:  06/29/16 109.6 kg (241 lb 10 oz)  06/16/15 117.9 kg (260 lb)  05/17/15 117.9 kg (260 lb)     Exam  General: Alert and oriented x 3, NAD  HEENT:    Neck: Supple, no JVD  Cardiovascular: S1 S2 auscultated, no rubs, murmurs or gallops. Regular rate and rhythm.  Respiratory: Clear to auscultation bilaterally, no wheezing, rales or rhonchi  Gastrointestinal: Soft, nontender, nondistended, + bowel sounds  Ext: Bilateral BKA  Neuro: AAOx3, Cr N's II- XII.  Skin: No rashes  Psych: Normal affect and demeanor, alert and oriented x3    Data Reviewed:  I have personally reviewed following labs and imaging studies  Micro Results No results found for this or any previous visit (from the past 240 hour(s)).  Radiology Reports Dg Chest 1 View  Result Date: 06/21/2016 CLINICAL DATA:  Congestive heart failure EXAM: CHEST 1 VIEW COMPARISON:  06/18/2016 FINDINGS: Cardiomegaly again noted. Single lead cardiac pacemaker is unchanged in position. Improvement in aeration. Residual mild perihilar interstitial prominence without convincing pulmonary edema. Mild left basilar atelectasis. Stable trace bilateral pleural effusion. IMPRESSION: Improvement in aeration. Residual mild perihilar interstitial prominence without convincing pulmonary edema. Mild left basilar atelectasis. Trace bilateral pleural effusion again noted Electronically Signed   By: Lahoma Crocker M.D.   On: 06/21/2016 09:15   US Renal  Result Date: 06/20/2016 CLINICAL DATA:  Acute kidney injury. EXAM: RENAL / URINARY TRACT  ULTRASOUND COMPLETE COMPARISON:  Renal ultrasound of January 19, 2015. FINDINGS: Right Kidney: Length: 11.9 cm. Echogenicity within normal limits. No mass or hydronephrosis visualized. Left Kidney: Length: 13.1 cm. 4.9 cm simple cyst is noted medially. Echogenicity within normal limits. No mass or hydronephrosis visualized. Bladder: Appears normal for degree of bladder distention. Ureteral jets are not visualized. IMPRESSION: Simple left renal cyst.  No hydronephrosis is noted. Electronically Signed   By: Jeneen Rinks  Murlean Caller, M.D.   On: 06/20/2016 10:23   Dg Chest Port 1 View  Result Date: 06/18/2016 CLINICAL DATA:  Shortness of breath increasingly worse over the past 8 days. History of CHF, diabetes, hypertension, stroke, TIA, PVD. EXAM: PORTABLE CHEST 1 VIEW COMPARISON:  09/27/2010 FINDINGS: Cardiac pacemaker. Shallow inspiration. Cardiac enlargement with diffuse pulmonary vascular congestion. Hazy perihilar opacities likely representing early edema. Probable small right pleural effusion. No focal volume loss or consolidation. No pneumothorax. Calcification of the aorta. IMPRESSION: Likely congestive heart failure with cardiac enlargement, pulmonary vascular congestion, perihilar edema, and small right pleural effusion. Electronically Signed   By: Lucienne Capers M.D.   On: 06/18/2016 23:30    Lab Data:  CBC:  Recent Labs Lab 06/22/16 1525 06/24/16 0257 06/25/16 0848 06/27/16 1330 06/28/16 0421 06/29/16 0657  WBC 7.5 6.8 8.0 10.2 11.0* 11.0*  NEUTROABS 5.7  --   --   --   --   --   HGB 8.9* 8.1* 8.1* 8.4* 8.4* 8.8*  HCT 27.5* 25.9* 26.0* 27.5* 27.5* 28.6*  MCV 93.2 92.2 92.2 94.5 95.8 95.0  PLT 191 177 183 185 177 315   Basic Metabolic Panel:  Recent Labs Lab 06/23/16 0711 06/24/16 0257 06/25/16 0849 06/27/16 1330 06/29/16 0657  NA  --  142 142 140 140  K  --  4.3 3.9 4.1 4.6  CL  --  102 102 101 102  CO2  --  28 27 28 27   GLUCOSE  --  184* 122* 219* 164*  BUN  --  101* 93* 59*  40*  CREATININE  --  5.44* 5.55* 4.91* 4.25*  CALCIUM  --  8.4* 8.5* 8.3* 8.4*  PHOS 4.7*  --  4.9* 3.6 3.4   GFR: Estimated Creatinine Clearance: 22.7 mL/min (A) (by C-G formula based on SCr of 4.25 mg/dL (H)). Liver Function Tests:  Recent Labs Lab 06/25/16 0849 06/27/16 1330 06/29/16 0657  ALBUMIN 3.1* 3.0* 3.1*   No results for input(s): LIPASE, AMYLASE in the last 168 hours. No results for input(s): AMMONIA in the last 168 hours. Coagulation Profile:  Recent Labs Lab 06/24/16 0257  INR 1.22   Cardiac Enzymes: No results for input(s): CKTOTAL, CKMB, CKMBINDEX, TROPONINI in the last 168 hours. BNP (last 3 results) No results for input(s): PROBNP in the last 8760 hours. HbA1C:  Recent Labs  06/26/16 1111  HGBA1C 5.4   CBG:  Recent Labs Lab 06/27/16 2124 06/28/16 0747 06/28/16 1203 06/28/16 1654 06/28/16 2111  GLUCAP 317* 155* 197* 216* 188*   Lipid Profile: No results for input(s): CHOL, HDL, LDLCALC, TRIG, CHOLHDL, LDLDIRECT in the last 72 hours. Thyroid Function Tests: No results for input(s): TSH, T4TOTAL, FREET4, T3FREE, THYROIDAB in the last 72 hours. Anemia Panel: No results for input(s): VITAMINB12, FOLATE, FERRITIN, TIBC, IRON, RETICCTPCT in the last 72 hours. Urine analysis:    Component Value Date/Time   COLORURINE YELLOW 11/18/2010 [place] 11/18/2010 0135   LABSPEC 1.010 11/18/2010 0135   PHURINE 6.0 11/18/2010 0135   GLUCOSEU NEGATIVE 11/18/2010 0135   HGBUR NEGATIVE 11/18/2010 0135   BILIRUBINUR NEGATIVE 11/18/2010 0135   [place] 11/18/2010 0135   PROTEINUR NEGATIVE 11/18/2010 0135   UROBILINOGEN 0.2 11/18/2010 0135   NITRITE NEGATIVE 11/18/2010 0135   LEUKOCYTESUR NEGATIVE 11/18/2010 0135     Krishiv Sandler M.D. [facility] 06/29/2016, 10:38 AM  Pager: [phone] Between 7am to 7pm - call Pager - [phone]  After 7pm go to [email or web address] - password [facility]  Call night coverage person covering  after 7pm

## 2016-07-01 DIAGNOSIS — N186 End stage renal disease: Secondary | ICD-10-CM | POA: Diagnosis not present

## 2016-07-01 DIAGNOSIS — D631 Anemia in chronic kidney disease: Secondary | ICD-10-CM | POA: Diagnosis not present

## 2016-07-01 DIAGNOSIS — Z131 Encounter for screening for diabetes mellitus: Secondary | ICD-10-CM | POA: Diagnosis not present

## 2016-07-01 DIAGNOSIS — Z992 Dependence on renal dialysis: Secondary | ICD-10-CM | POA: Diagnosis not present

## 2016-07-04 DIAGNOSIS — Z992 Dependence on renal dialysis: Secondary | ICD-10-CM | POA: Diagnosis not present

## 2016-07-04 DIAGNOSIS — Z23 Encounter for immunization: Secondary | ICD-10-CM | POA: Diagnosis not present

## 2016-07-04 DIAGNOSIS — N186 End stage renal disease: Secondary | ICD-10-CM | POA: Diagnosis not present

## 2016-07-04 DIAGNOSIS — D631 Anemia in chronic kidney disease: Secondary | ICD-10-CM | POA: Diagnosis not present

## 2016-07-04 DIAGNOSIS — D509 Iron deficiency anemia, unspecified: Secondary | ICD-10-CM | POA: Diagnosis not present

## 2016-07-06 DIAGNOSIS — D509 Iron deficiency anemia, unspecified: Secondary | ICD-10-CM | POA: Diagnosis not present

## 2016-07-06 DIAGNOSIS — Z23 Encounter for immunization: Secondary | ICD-10-CM | POA: Diagnosis not present

## 2016-07-06 DIAGNOSIS — D631 Anemia in chronic kidney disease: Secondary | ICD-10-CM | POA: Diagnosis not present

## 2016-07-06 DIAGNOSIS — Z992 Dependence on renal dialysis: Secondary | ICD-10-CM | POA: Diagnosis not present

## 2016-07-06 DIAGNOSIS — N186 End stage renal disease: Secondary | ICD-10-CM | POA: Diagnosis not present

## 2016-07-08 DIAGNOSIS — D509 Iron deficiency anemia, unspecified: Secondary | ICD-10-CM | POA: Diagnosis not present

## 2016-07-08 DIAGNOSIS — Z992 Dependence on renal dialysis: Secondary | ICD-10-CM | POA: Diagnosis not present

## 2016-07-08 DIAGNOSIS — N186 End stage renal disease: Secondary | ICD-10-CM | POA: Diagnosis not present

## 2016-07-08 DIAGNOSIS — D631 Anemia in chronic kidney disease: Secondary | ICD-10-CM | POA: Diagnosis not present

## 2016-07-08 DIAGNOSIS — Z23 Encounter for immunization: Secondary | ICD-10-CM | POA: Diagnosis not present

## 2016-07-11 DIAGNOSIS — N186 End stage renal disease: Secondary | ICD-10-CM | POA: Diagnosis not present

## 2016-07-11 DIAGNOSIS — D631 Anemia in chronic kidney disease: Secondary | ICD-10-CM | POA: Diagnosis not present

## 2016-07-11 DIAGNOSIS — N2581 Secondary hyperparathyroidism of renal origin: Secondary | ICD-10-CM | POA: Diagnosis not present

## 2016-07-11 DIAGNOSIS — D509 Iron deficiency anemia, unspecified: Secondary | ICD-10-CM | POA: Diagnosis not present

## 2016-07-11 DIAGNOSIS — Z992 Dependence on renal dialysis: Secondary | ICD-10-CM | POA: Diagnosis not present

## 2016-07-11 DIAGNOSIS — Z23 Encounter for immunization: Secondary | ICD-10-CM | POA: Diagnosis not present

## 2016-07-12 DIAGNOSIS — H2512 Age-related nuclear cataract, left eye: Secondary | ICD-10-CM | POA: Diagnosis not present

## 2016-07-12 DIAGNOSIS — E113493 Type 2 diabetes mellitus with severe nonproliferative diabetic retinopathy without macular edema, bilateral: Secondary | ICD-10-CM | POA: Diagnosis not present

## 2016-07-12 DIAGNOSIS — Z961 Presence of intraocular lens: Secondary | ICD-10-CM | POA: Diagnosis not present

## 2016-07-12 DIAGNOSIS — H4051X2 Glaucoma secondary to other eye disorders, right eye, moderate stage: Secondary | ICD-10-CM | POA: Diagnosis not present

## 2016-07-13 DIAGNOSIS — N186 End stage renal disease: Secondary | ICD-10-CM | POA: Diagnosis not present

## 2016-07-13 DIAGNOSIS — Z992 Dependence on renal dialysis: Secondary | ICD-10-CM | POA: Diagnosis not present

## 2016-07-13 DIAGNOSIS — D509 Iron deficiency anemia, unspecified: Secondary | ICD-10-CM | POA: Diagnosis not present

## 2016-07-13 DIAGNOSIS — D631 Anemia in chronic kidney disease: Secondary | ICD-10-CM | POA: Diagnosis not present

## 2016-07-13 DIAGNOSIS — Z23 Encounter for immunization: Secondary | ICD-10-CM | POA: Diagnosis not present

## 2016-07-13 LAB — PARATHYROID HORMONE, INTACT (NO CA)

## 2016-07-15 DIAGNOSIS — D509 Iron deficiency anemia, unspecified: Secondary | ICD-10-CM | POA: Diagnosis not present

## 2016-07-15 DIAGNOSIS — N186 End stage renal disease: Secondary | ICD-10-CM | POA: Diagnosis not present

## 2016-07-15 DIAGNOSIS — D631 Anemia in chronic kidney disease: Secondary | ICD-10-CM | POA: Diagnosis not present

## 2016-07-15 DIAGNOSIS — Z992 Dependence on renal dialysis: Secondary | ICD-10-CM | POA: Diagnosis not present

## 2016-07-15 DIAGNOSIS — Z23 Encounter for immunization: Secondary | ICD-10-CM | POA: Diagnosis not present

## 2016-07-18 DIAGNOSIS — D509 Iron deficiency anemia, unspecified: Secondary | ICD-10-CM | POA: Diagnosis not present

## 2016-07-18 DIAGNOSIS — Z992 Dependence on renal dialysis: Secondary | ICD-10-CM | POA: Diagnosis not present

## 2016-07-18 DIAGNOSIS — D631 Anemia in chronic kidney disease: Secondary | ICD-10-CM | POA: Diagnosis not present

## 2016-07-18 DIAGNOSIS — Z23 Encounter for immunization: Secondary | ICD-10-CM | POA: Diagnosis not present

## 2016-07-18 DIAGNOSIS — N186 End stage renal disease: Secondary | ICD-10-CM | POA: Diagnosis not present

## 2016-07-19 DIAGNOSIS — I10 Essential (primary) hypertension: Secondary | ICD-10-CM | POA: Diagnosis not present

## 2016-07-19 DIAGNOSIS — E119 Type 2 diabetes mellitus without complications: Secondary | ICD-10-CM | POA: Diagnosis not present

## 2016-07-19 DIAGNOSIS — I509 Heart failure, unspecified: Secondary | ICD-10-CM | POA: Diagnosis not present

## 2016-07-19 DIAGNOSIS — N184 Chronic kidney disease, stage 4 (severe): Secondary | ICD-10-CM | POA: Diagnosis not present

## 2016-07-20 DIAGNOSIS — D631 Anemia in chronic kidney disease: Secondary | ICD-10-CM | POA: Diagnosis not present

## 2016-07-20 DIAGNOSIS — Z23 Encounter for immunization: Secondary | ICD-10-CM | POA: Diagnosis not present

## 2016-07-20 DIAGNOSIS — D509 Iron deficiency anemia, unspecified: Secondary | ICD-10-CM | POA: Diagnosis not present

## 2016-07-20 DIAGNOSIS — Z992 Dependence on renal dialysis: Secondary | ICD-10-CM | POA: Diagnosis not present

## 2016-07-20 DIAGNOSIS — N186 End stage renal disease: Secondary | ICD-10-CM | POA: Diagnosis not present

## 2016-07-22 DIAGNOSIS — D631 Anemia in chronic kidney disease: Secondary | ICD-10-CM | POA: Diagnosis not present

## 2016-07-22 DIAGNOSIS — D509 Iron deficiency anemia, unspecified: Secondary | ICD-10-CM | POA: Diagnosis not present

## 2016-07-22 DIAGNOSIS — Z992 Dependence on renal dialysis: Secondary | ICD-10-CM | POA: Diagnosis not present

## 2016-07-22 DIAGNOSIS — Z23 Encounter for immunization: Secondary | ICD-10-CM | POA: Diagnosis not present

## 2016-07-22 DIAGNOSIS — N186 End stage renal disease: Secondary | ICD-10-CM | POA: Diagnosis not present

## 2016-07-25 DIAGNOSIS — D631 Anemia in chronic kidney disease: Secondary | ICD-10-CM | POA: Diagnosis not present

## 2016-07-25 DIAGNOSIS — Z23 Encounter for immunization: Secondary | ICD-10-CM | POA: Diagnosis not present

## 2016-07-25 DIAGNOSIS — Z992 Dependence on renal dialysis: Secondary | ICD-10-CM | POA: Diagnosis not present

## 2016-07-25 DIAGNOSIS — N186 End stage renal disease: Secondary | ICD-10-CM | POA: Diagnosis not present

## 2016-07-25 DIAGNOSIS — D509 Iron deficiency anemia, unspecified: Secondary | ICD-10-CM | POA: Diagnosis not present

## 2016-07-27 DIAGNOSIS — N186 End stage renal disease: Secondary | ICD-10-CM | POA: Diagnosis not present

## 2016-07-27 DIAGNOSIS — Z23 Encounter for immunization: Secondary | ICD-10-CM | POA: Diagnosis not present

## 2016-07-27 DIAGNOSIS — Z992 Dependence on renal dialysis: Secondary | ICD-10-CM | POA: Diagnosis not present

## 2016-07-27 DIAGNOSIS — D631 Anemia in chronic kidney disease: Secondary | ICD-10-CM | POA: Diagnosis not present

## 2016-07-27 DIAGNOSIS — D509 Iron deficiency anemia, unspecified: Secondary | ICD-10-CM | POA: Diagnosis not present

## 2016-07-29 DIAGNOSIS — Z992 Dependence on renal dialysis: Secondary | ICD-10-CM | POA: Diagnosis not present

## 2016-07-29 DIAGNOSIS — D509 Iron deficiency anemia, unspecified: Secondary | ICD-10-CM | POA: Diagnosis not present

## 2016-07-29 DIAGNOSIS — D631 Anemia in chronic kidney disease: Secondary | ICD-10-CM | POA: Diagnosis not present

## 2016-07-29 DIAGNOSIS — Z23 Encounter for immunization: Secondary | ICD-10-CM | POA: Diagnosis not present

## 2016-07-29 DIAGNOSIS — N186 End stage renal disease: Secondary | ICD-10-CM | POA: Diagnosis not present

## 2016-07-30 DIAGNOSIS — Z89519 Acquired absence of unspecified leg below knee: Secondary | ICD-10-CM | POA: Diagnosis not present

## 2016-07-30 DIAGNOSIS — I5043 Acute on chronic combined systolic (congestive) and diastolic (congestive) heart failure: Secondary | ICD-10-CM | POA: Diagnosis not present

## 2016-08-01 DIAGNOSIS — N186 End stage renal disease: Secondary | ICD-10-CM | POA: Diagnosis not present

## 2016-08-01 DIAGNOSIS — D631 Anemia in chronic kidney disease: Secondary | ICD-10-CM | POA: Diagnosis not present

## 2016-08-01 DIAGNOSIS — Z992 Dependence on renal dialysis: Secondary | ICD-10-CM | POA: Diagnosis not present

## 2016-08-01 DIAGNOSIS — Z23 Encounter for immunization: Secondary | ICD-10-CM | POA: Diagnosis not present

## 2016-08-01 DIAGNOSIS — D509 Iron deficiency anemia, unspecified: Secondary | ICD-10-CM | POA: Diagnosis not present

## 2016-08-03 DIAGNOSIS — N186 End stage renal disease: Secondary | ICD-10-CM | POA: Diagnosis not present

## 2016-08-03 DIAGNOSIS — Z992 Dependence on renal dialysis: Secondary | ICD-10-CM | POA: Diagnosis not present

## 2016-08-03 DIAGNOSIS — D631 Anemia in chronic kidney disease: Secondary | ICD-10-CM | POA: Diagnosis not present

## 2016-08-03 DIAGNOSIS — Z23 Encounter for immunization: Secondary | ICD-10-CM | POA: Diagnosis not present

## 2016-08-03 DIAGNOSIS — D509 Iron deficiency anemia, unspecified: Secondary | ICD-10-CM | POA: Diagnosis not present

## 2016-08-05 DIAGNOSIS — D631 Anemia in chronic kidney disease: Secondary | ICD-10-CM | POA: Diagnosis not present

## 2016-08-05 DIAGNOSIS — N186 End stage renal disease: Secondary | ICD-10-CM | POA: Diagnosis not present

## 2016-08-05 DIAGNOSIS — D509 Iron deficiency anemia, unspecified: Secondary | ICD-10-CM | POA: Diagnosis not present

## 2016-08-05 DIAGNOSIS — Z992 Dependence on renal dialysis: Secondary | ICD-10-CM | POA: Diagnosis not present

## 2016-08-05 DIAGNOSIS — Z23 Encounter for immunization: Secondary | ICD-10-CM | POA: Diagnosis not present

## 2016-08-08 DIAGNOSIS — Z23 Encounter for immunization: Secondary | ICD-10-CM | POA: Diagnosis not present

## 2016-08-08 DIAGNOSIS — N186 End stage renal disease: Secondary | ICD-10-CM | POA: Diagnosis not present

## 2016-08-08 DIAGNOSIS — D509 Iron deficiency anemia, unspecified: Secondary | ICD-10-CM | POA: Diagnosis not present

## 2016-08-08 DIAGNOSIS — Z992 Dependence on renal dialysis: Secondary | ICD-10-CM | POA: Diagnosis not present

## 2016-08-08 DIAGNOSIS — D631 Anemia in chronic kidney disease: Secondary | ICD-10-CM | POA: Diagnosis not present

## 2016-08-10 DIAGNOSIS — N186 End stage renal disease: Secondary | ICD-10-CM | POA: Diagnosis not present

## 2016-08-10 DIAGNOSIS — D509 Iron deficiency anemia, unspecified: Secondary | ICD-10-CM | POA: Diagnosis not present

## 2016-08-10 DIAGNOSIS — D631 Anemia in chronic kidney disease: Secondary | ICD-10-CM | POA: Diagnosis not present

## 2016-08-10 DIAGNOSIS — Z992 Dependence on renal dialysis: Secondary | ICD-10-CM | POA: Diagnosis not present

## 2016-08-10 DIAGNOSIS — Z23 Encounter for immunization: Secondary | ICD-10-CM | POA: Diagnosis not present

## 2016-08-12 DIAGNOSIS — Z992 Dependence on renal dialysis: Secondary | ICD-10-CM | POA: Diagnosis not present

## 2016-08-12 DIAGNOSIS — N186 End stage renal disease: Secondary | ICD-10-CM | POA: Diagnosis not present

## 2016-08-12 DIAGNOSIS — Z23 Encounter for immunization: Secondary | ICD-10-CM | POA: Diagnosis not present

## 2016-08-12 DIAGNOSIS — D509 Iron deficiency anemia, unspecified: Secondary | ICD-10-CM | POA: Diagnosis not present

## 2016-08-12 DIAGNOSIS — D631 Anemia in chronic kidney disease: Secondary | ICD-10-CM | POA: Diagnosis not present

## 2016-08-15 DIAGNOSIS — D509 Iron deficiency anemia, unspecified: Secondary | ICD-10-CM | POA: Diagnosis not present

## 2016-08-15 DIAGNOSIS — D631 Anemia in chronic kidney disease: Secondary | ICD-10-CM | POA: Diagnosis not present

## 2016-08-15 DIAGNOSIS — Z23 Encounter for immunization: Secondary | ICD-10-CM | POA: Diagnosis not present

## 2016-08-15 DIAGNOSIS — N186 End stage renal disease: Secondary | ICD-10-CM | POA: Diagnosis not present

## 2016-08-15 DIAGNOSIS — Z992 Dependence on renal dialysis: Secondary | ICD-10-CM | POA: Diagnosis not present

## 2016-08-17 DIAGNOSIS — Z992 Dependence on renal dialysis: Secondary | ICD-10-CM | POA: Diagnosis not present

## 2016-08-17 DIAGNOSIS — D509 Iron deficiency anemia, unspecified: Secondary | ICD-10-CM | POA: Diagnosis not present

## 2016-08-17 DIAGNOSIS — D631 Anemia in chronic kidney disease: Secondary | ICD-10-CM | POA: Diagnosis not present

## 2016-08-17 DIAGNOSIS — Z23 Encounter for immunization: Secondary | ICD-10-CM | POA: Diagnosis not present

## 2016-08-17 DIAGNOSIS — N186 End stage renal disease: Secondary | ICD-10-CM | POA: Diagnosis not present

## 2016-08-19 DIAGNOSIS — E119 Type 2 diabetes mellitus without complications: Secondary | ICD-10-CM | POA: Diagnosis not present

## 2016-08-19 DIAGNOSIS — N186 End stage renal disease: Secondary | ICD-10-CM | POA: Diagnosis not present

## 2016-08-19 DIAGNOSIS — Z992 Dependence on renal dialysis: Secondary | ICD-10-CM | POA: Diagnosis not present

## 2016-08-19 DIAGNOSIS — Z23 Encounter for immunization: Secondary | ICD-10-CM | POA: Diagnosis not present

## 2016-08-19 DIAGNOSIS — D509 Iron deficiency anemia, unspecified: Secondary | ICD-10-CM | POA: Diagnosis not present

## 2016-08-19 DIAGNOSIS — D631 Anemia in chronic kidney disease: Secondary | ICD-10-CM | POA: Diagnosis not present

## 2016-08-22 DIAGNOSIS — N186 End stage renal disease: Secondary | ICD-10-CM | POA: Diagnosis not present

## 2016-08-22 DIAGNOSIS — Z23 Encounter for immunization: Secondary | ICD-10-CM | POA: Diagnosis not present

## 2016-08-22 DIAGNOSIS — D509 Iron deficiency anemia, unspecified: Secondary | ICD-10-CM | POA: Diagnosis not present

## 2016-08-22 DIAGNOSIS — D631 Anemia in chronic kidney disease: Secondary | ICD-10-CM | POA: Diagnosis not present

## 2016-08-22 DIAGNOSIS — Z992 Dependence on renal dialysis: Secondary | ICD-10-CM | POA: Diagnosis not present

## 2016-08-24 DIAGNOSIS — Z992 Dependence on renal dialysis: Secondary | ICD-10-CM | POA: Diagnosis not present

## 2016-08-24 DIAGNOSIS — Z23 Encounter for immunization: Secondary | ICD-10-CM | POA: Diagnosis not present

## 2016-08-24 DIAGNOSIS — D509 Iron deficiency anemia, unspecified: Secondary | ICD-10-CM | POA: Diagnosis not present

## 2016-08-24 DIAGNOSIS — N186 End stage renal disease: Secondary | ICD-10-CM | POA: Diagnosis not present

## 2016-08-24 DIAGNOSIS — D631 Anemia in chronic kidney disease: Secondary | ICD-10-CM | POA: Diagnosis not present

## 2016-08-26 DIAGNOSIS — N186 End stage renal disease: Secondary | ICD-10-CM | POA: Diagnosis not present

## 2016-08-26 DIAGNOSIS — Z992 Dependence on renal dialysis: Secondary | ICD-10-CM | POA: Diagnosis not present

## 2016-08-26 DIAGNOSIS — D509 Iron deficiency anemia, unspecified: Secondary | ICD-10-CM | POA: Diagnosis not present

## 2016-08-26 DIAGNOSIS — Z23 Encounter for immunization: Secondary | ICD-10-CM | POA: Diagnosis not present

## 2016-08-26 DIAGNOSIS — D631 Anemia in chronic kidney disease: Secondary | ICD-10-CM | POA: Diagnosis not present

## 2016-08-29 DIAGNOSIS — N186 End stage renal disease: Secondary | ICD-10-CM | POA: Diagnosis not present

## 2016-08-29 DIAGNOSIS — Z992 Dependence on renal dialysis: Secondary | ICD-10-CM | POA: Diagnosis not present

## 2016-08-29 DIAGNOSIS — Z89519 Acquired absence of unspecified leg below knee: Secondary | ICD-10-CM | POA: Diagnosis not present

## 2016-08-29 DIAGNOSIS — D509 Iron deficiency anemia, unspecified: Secondary | ICD-10-CM | POA: Diagnosis not present

## 2016-08-29 DIAGNOSIS — Z23 Encounter for immunization: Secondary | ICD-10-CM | POA: Diagnosis not present

## 2016-08-29 DIAGNOSIS — I5043 Acute on chronic combined systolic (congestive) and diastolic (congestive) heart failure: Secondary | ICD-10-CM | POA: Diagnosis not present

## 2016-08-29 DIAGNOSIS — D631 Anemia in chronic kidney disease: Secondary | ICD-10-CM | POA: Diagnosis not present

## 2016-08-31 DIAGNOSIS — E119 Type 2 diabetes mellitus without complications: Secondary | ICD-10-CM | POA: Diagnosis not present

## 2016-08-31 DIAGNOSIS — D509 Iron deficiency anemia, unspecified: Secondary | ICD-10-CM | POA: Diagnosis not present

## 2016-08-31 DIAGNOSIS — D631 Anemia in chronic kidney disease: Secondary | ICD-10-CM | POA: Diagnosis not present

## 2016-08-31 DIAGNOSIS — Z992 Dependence on renal dialysis: Secondary | ICD-10-CM | POA: Diagnosis not present

## 2016-08-31 DIAGNOSIS — N186 End stage renal disease: Secondary | ICD-10-CM | POA: Diagnosis not present

## 2016-08-31 DIAGNOSIS — Z23 Encounter for immunization: Secondary | ICD-10-CM | POA: Diagnosis not present

## 2016-09-01 DIAGNOSIS — Z992 Dependence on renal dialysis: Secondary | ICD-10-CM | POA: Diagnosis not present

## 2016-09-01 DIAGNOSIS — N186 End stage renal disease: Secondary | ICD-10-CM | POA: Diagnosis not present

## 2016-09-02 DIAGNOSIS — N186 End stage renal disease: Secondary | ICD-10-CM | POA: Diagnosis not present

## 2016-09-02 DIAGNOSIS — D631 Anemia in chronic kidney disease: Secondary | ICD-10-CM | POA: Diagnosis not present

## 2016-09-02 DIAGNOSIS — D509 Iron deficiency anemia, unspecified: Secondary | ICD-10-CM | POA: Diagnosis not present

## 2016-09-02 DIAGNOSIS — Z23 Encounter for immunization: Secondary | ICD-10-CM | POA: Diagnosis not present

## 2016-09-02 DIAGNOSIS — Z992 Dependence on renal dialysis: Secondary | ICD-10-CM | POA: Diagnosis not present

## 2016-09-05 DIAGNOSIS — Z23 Encounter for immunization: Secondary | ICD-10-CM | POA: Diagnosis not present

## 2016-09-05 DIAGNOSIS — D509 Iron deficiency anemia, unspecified: Secondary | ICD-10-CM | POA: Diagnosis not present

## 2016-09-05 DIAGNOSIS — D631 Anemia in chronic kidney disease: Secondary | ICD-10-CM | POA: Diagnosis not present

## 2016-09-05 DIAGNOSIS — Z992 Dependence on renal dialysis: Secondary | ICD-10-CM | POA: Diagnosis not present

## 2016-09-05 DIAGNOSIS — N186 End stage renal disease: Secondary | ICD-10-CM | POA: Diagnosis not present

## 2016-09-07 DIAGNOSIS — Z992 Dependence on renal dialysis: Secondary | ICD-10-CM | POA: Diagnosis not present

## 2016-09-07 DIAGNOSIS — D631 Anemia in chronic kidney disease: Secondary | ICD-10-CM | POA: Diagnosis not present

## 2016-09-07 DIAGNOSIS — Z23 Encounter for immunization: Secondary | ICD-10-CM | POA: Diagnosis not present

## 2016-09-07 DIAGNOSIS — N186 End stage renal disease: Secondary | ICD-10-CM | POA: Diagnosis not present

## 2016-09-07 DIAGNOSIS — D509 Iron deficiency anemia, unspecified: Secondary | ICD-10-CM | POA: Diagnosis not present

## 2016-09-08 ENCOUNTER — Encounter (HOSPITAL_COMMUNITY): Payer: Self-pay | Admitting: Emergency Medicine

## 2016-09-08 ENCOUNTER — Inpatient Hospital Stay (HOSPITAL_COMMUNITY)
Admission: EM | Admit: 2016-09-08 | Discharge: 2016-09-14 | DRG: 579 | Disposition: A | Discharge status: Skilled Nursing Facility | Payer: Medicare Other | Attending: Internal Medicine | Admitting: Internal Medicine

## 2016-09-08 DIAGNOSIS — Z79899 Other long term (current) drug therapy: Secondary | ICD-10-CM

## 2016-09-08 DIAGNOSIS — Z7982 Long term (current) use of aspirin: Secondary | ICD-10-CM | POA: Diagnosis not present

## 2016-09-08 DIAGNOSIS — N186 End stage renal disease: Secondary | ICD-10-CM | POA: Diagnosis not present

## 2016-09-08 DIAGNOSIS — Z7902 Long term (current) use of antithrombotics/antiplatelets: Secondary | ICD-10-CM

## 2016-09-08 DIAGNOSIS — R030 Elevated blood-pressure reading, without diagnosis of hypertension: Secondary | ICD-10-CM | POA: Diagnosis not present

## 2016-09-08 DIAGNOSIS — N059 Unspecified nephritic syndrome with unspecified morphologic changes: Secondary | ICD-10-CM | POA: Diagnosis present

## 2016-09-08 DIAGNOSIS — L0291 Cutaneous abscess, unspecified: Secondary | ICD-10-CM | POA: Diagnosis not present

## 2016-09-08 DIAGNOSIS — I5042 Chronic combined systolic (congestive) and diastolic (congestive) heart failure: Secondary | ICD-10-CM | POA: Diagnosis present

## 2016-09-08 DIAGNOSIS — I132 Hypertensive heart and chronic kidney disease with heart failure and with stage 5 chronic kidney disease, or end stage renal disease: Secondary | ICD-10-CM | POA: Diagnosis present

## 2016-09-08 DIAGNOSIS — Z89511 Acquired absence of right leg below knee: Secondary | ICD-10-CM | POA: Diagnosis not present

## 2016-09-08 DIAGNOSIS — Z95810 Presence of automatic (implantable) cardiac defibrillator: Secondary | ICD-10-CM

## 2016-09-08 DIAGNOSIS — E871 Hypo-osmolality and hyponatremia: Secondary | ICD-10-CM | POA: Diagnosis present

## 2016-09-08 DIAGNOSIS — E1122 Type 2 diabetes mellitus with diabetic chronic kidney disease: Secondary | ICD-10-CM | POA: Diagnosis present

## 2016-09-08 DIAGNOSIS — Z8673 Personal history of transient ischemic attack (TIA), and cerebral infarction without residual deficits: Secondary | ICD-10-CM

## 2016-09-08 DIAGNOSIS — L0231 Cutaneous abscess of buttock: Principal | ICD-10-CM | POA: Diagnosis present

## 2016-09-08 DIAGNOSIS — E1165 Type 2 diabetes mellitus with hyperglycemia: Secondary | ICD-10-CM | POA: Diagnosis not present

## 2016-09-08 DIAGNOSIS — Z9841 Cataract extraction status, right eye: Secondary | ICD-10-CM

## 2016-09-08 DIAGNOSIS — I10 Essential (primary) hypertension: Secondary | ICD-10-CM | POA: Diagnosis present

## 2016-09-08 DIAGNOSIS — M6281 Muscle weakness (generalized): Secondary | ICD-10-CM | POA: Diagnosis not present

## 2016-09-08 DIAGNOSIS — E785 Hyperlipidemia, unspecified: Secondary | ICD-10-CM | POA: Diagnosis not present

## 2016-09-08 DIAGNOSIS — L03317 Cellulitis of buttock: Secondary | ICD-10-CM | POA: Diagnosis present

## 2016-09-08 DIAGNOSIS — Z992 Dependence on renal dialysis: Secondary | ICD-10-CM

## 2016-09-08 DIAGNOSIS — D631 Anemia in chronic kidney disease: Secondary | ICD-10-CM | POA: Diagnosis present

## 2016-09-08 DIAGNOSIS — E1159 Type 2 diabetes mellitus with other circulatory complications: Secondary | ICD-10-CM | POA: Diagnosis not present

## 2016-09-08 DIAGNOSIS — M5489 Other dorsalgia: Secondary | ICD-10-CM | POA: Diagnosis not present

## 2016-09-08 DIAGNOSIS — Z794 Long term (current) use of insulin: Secondary | ICD-10-CM | POA: Diagnosis not present

## 2016-09-08 DIAGNOSIS — N189 Chronic kidney disease, unspecified: Secondary | ICD-10-CM | POA: Diagnosis present

## 2016-09-08 DIAGNOSIS — Z87891 Personal history of nicotine dependence: Secondary | ICD-10-CM

## 2016-09-08 DIAGNOSIS — M1990 Unspecified osteoarthritis, unspecified site: Secondary | ICD-10-CM | POA: Diagnosis present

## 2016-09-08 DIAGNOSIS — I739 Peripheral vascular disease, unspecified: Secondary | ICD-10-CM | POA: Diagnosis not present

## 2016-09-08 DIAGNOSIS — E1151 Type 2 diabetes mellitus with diabetic peripheral angiopathy without gangrene: Secondary | ICD-10-CM | POA: Diagnosis present

## 2016-09-08 DIAGNOSIS — Z89512 Acquired absence of left leg below knee: Secondary | ICD-10-CM

## 2016-09-08 DIAGNOSIS — I679 Cerebrovascular disease, unspecified: Secondary | ICD-10-CM | POA: Diagnosis not present

## 2016-09-08 DIAGNOSIS — R739 Hyperglycemia, unspecified: Secondary | ICD-10-CM

## 2016-09-08 DIAGNOSIS — I428 Other cardiomyopathies: Secondary | ICD-10-CM | POA: Diagnosis present

## 2016-09-08 DIAGNOSIS — Z961 Presence of intraocular lens: Secondary | ICD-10-CM | POA: Diagnosis present

## 2016-09-08 DIAGNOSIS — Z9842 Cataract extraction status, left eye: Secondary | ICD-10-CM

## 2016-09-08 LAB — CBC WITH DIFFERENTIAL/PLATELET
Basophils Absolute: 0 10*3/uL (ref 0.0–0.1)
Basophils Relative: 0 %
Eosinophils Absolute: 0.2 10*3/uL (ref 0.0–0.7)
Eosinophils Relative: 2 %
HEMATOCRIT: 33.7 % — AB (ref 39.0–52.0)
Hemoglobin: 10.8 g/dL — ABNORMAL LOW (ref 13.0–17.0)
LYMPHS PCT: 6 %
Lymphs Abs: 0.7 10*3/uL (ref 0.7–4.0)
MCH: 29.2 pg (ref 26.0–34.0)
MCHC: 32 g/dL (ref 30.0–36.0)
MCV: 91.1 fL (ref 78.0–100.0)
MONO ABS: 0.8 10*3/uL (ref 0.1–1.0)
MONOS PCT: 7 %
NEUTROS ABS: 10.2 10*3/uL — AB (ref 1.7–7.7)
Neutrophils Relative %: 85 %
Platelets: 201 10*3/uL (ref 150–400)
RBC: 3.7 MIL/uL — ABNORMAL LOW (ref 4.22–5.81)
RDW: 12.5 % (ref 11.5–15.5)
WBC: 11.9 10*3/uL — ABNORMAL HIGH (ref 4.0–10.5)

## 2016-09-08 LAB — GLUCOSE, CAPILLARY: Glucose-Capillary: 311 mg/dL — ABNORMAL HIGH (ref 65–99)

## 2016-09-08 LAB — MRSA PCR SCREENING: MRSA by PCR: NEGATIVE

## 2016-09-08 LAB — BASIC METABOLIC PANEL
Anion gap: 12 (ref 5–15)
BUN: 40 mg/dL — ABNORMAL HIGH (ref 6–20)
CALCIUM: 8.5 mg/dL — AB (ref 8.9–10.3)
CO2: 28 mmol/L (ref 22–32)
CREATININE: 3.31 mg/dL — AB (ref 0.61–1.24)
Chloride: 92 mmol/L — ABNORMAL LOW (ref 101–111)
GFR calc Af Amer: 21 mL/min — ABNORMAL LOW (ref >60)
GFR calc non Af Amer: 18 mL/min — ABNORMAL LOW (ref >60)
GLUCOSE: 442 mg/dL — AB (ref 65–99)
Potassium: 3.6 mmol/L (ref 3.5–5.1)
Sodium: 132 mmol/L — ABNORMAL LOW (ref 135–145)

## 2016-09-08 LAB — LACTIC ACID, PLASMA
LACTIC ACID, VENOUS: 1.2 mmol/L (ref 0.5–1.9)
Lactic Acid, Venous: 1.1 mmol/L (ref 0.5–1.9)

## 2016-09-08 LAB — CBG MONITORING, ED: Glucose-Capillary: 320 mg/dL — ABNORMAL HIGH (ref 65–99)

## 2016-09-08 MED ORDER — FENOFIBRATE 160 MG PO TABS
160.0000 mg | ORAL_TABLET | Freq: Every day | ORAL | Status: DC
  Administered 2016-09-09 – 2016-09-13 (×5): 160 mg via ORAL
  Filled 2016-09-08 (×6): qty 1

## 2016-09-08 MED ORDER — INSULIN ASPART 100 UNIT/ML ~~LOC~~ SOLN
5.0000 [IU] | Freq: Once | SUBCUTANEOUS | Status: AC
  Administered 2016-09-08: 5 [IU] via INTRAVENOUS

## 2016-09-08 MED ORDER — CLONIDINE HCL 0.1 MG PO TABS
0.1000 mg | ORAL_TABLET | Freq: Every day | ORAL | Status: DC
  Administered 2016-09-08 – 2016-09-13 (×6): 0.1 mg via ORAL
  Filled 2016-09-08 (×6): qty 1

## 2016-09-08 MED ORDER — LATANOPROST 0.005 % OP SOLN
1.0000 [drp] | Freq: Every day | OPHTHALMIC | Status: DC
  Administered 2016-09-08 – 2016-09-13 (×6): 1 [drp] via OPHTHALMIC
  Filled 2016-09-08: qty 2.5

## 2016-09-08 MED ORDER — SODIUM CHLORIDE 0.9 % IV BOLUS (SEPSIS)
1000.0000 mL | Freq: Once | INTRAVENOUS | Status: AC
  Administered 2016-09-08: 1000 mL via INTRAVENOUS

## 2016-09-08 MED ORDER — OXYCODONE HCL 5 MG PO TABS
5.0000 mg | ORAL_TABLET | Freq: Every evening | ORAL | Status: DC | PRN
  Administered 2016-09-08 – 2016-09-11 (×4): 5 mg via ORAL
  Filled 2016-09-08 (×5): qty 1

## 2016-09-08 MED ORDER — TIMOLOL MALEATE 0.5 % OP SOLN
1.0000 [drp] | Freq: Two times a day (BID) | OPHTHALMIC | Status: DC
  Administered 2016-09-08 – 2016-09-13 (×11): 1 [drp] via OPHTHALMIC
  Filled 2016-09-08: qty 5

## 2016-09-08 MED ORDER — ROSUVASTATIN CALCIUM 20 MG PO TABS
20.0000 mg | ORAL_TABLET | Freq: Every day | ORAL | Status: DC
  Administered 2016-09-08 – 2016-09-13 (×6): 20 mg via ORAL
  Filled 2016-09-08 (×6): qty 1

## 2016-09-08 MED ORDER — DOCUSATE SODIUM 100 MG PO CAPS
100.0000 mg | ORAL_CAPSULE | Freq: Two times a day (BID) | ORAL | Status: DC
  Administered 2016-09-08 – 2016-09-13 (×11): 100 mg via ORAL
  Filled 2016-09-08 (×12): qty 1

## 2016-09-08 MED ORDER — POVIDONE-IODINE 10 % EX SOLN
CUTANEOUS | Status: AC
  Administered 2016-09-08: 1
  Filled 2016-09-08: qty 118

## 2016-09-08 MED ORDER — FOLIC ACID 1 MG PO TABS
1.0000 mg | ORAL_TABLET | Freq: Every day | ORAL | Status: DC
  Administered 2016-09-09 – 2016-09-13 (×5): 1 mg via ORAL
  Filled 2016-09-08 (×6): qty 1

## 2016-09-08 MED ORDER — ENOXAPARIN SODIUM 30 MG/0.3ML ~~LOC~~ SOLN
30.0000 mg | SUBCUTANEOUS | Status: DC
  Administered 2016-09-08 – 2016-09-13 (×6): 30 mg via SUBCUTANEOUS
  Filled 2016-09-08 (×6): qty 0.3

## 2016-09-08 MED ORDER — SODIUM CHLORIDE 0.9 % IV SOLN
1000.0000 mL | INTRAVENOUS | Status: DC
  Administered 2016-09-08: 1000 mL via INTRAVENOUS

## 2016-09-08 MED ORDER — INSULIN GLARGINE 100 UNIT/ML ~~LOC~~ SOLN
10.0000 [IU] | Freq: Every day | SUBCUTANEOUS | Status: DC
  Administered 2016-09-08: 10 [IU] via SUBCUTANEOUS
  Filled 2016-09-08 (×4): qty 0.1

## 2016-09-08 MED ORDER — CALCIUM ACETATE (PHOS BINDER) 667 MG PO CAPS
667.0000 mg | ORAL_CAPSULE | Freq: Every day | ORAL | Status: DC
  Administered 2016-09-08 – 2016-09-13 (×6): 667 mg via ORAL
  Filled 2016-09-08 (×6): qty 1

## 2016-09-08 MED ORDER — INSULIN ASPART 100 UNIT/ML ~~LOC~~ SOLN
0.0000 [IU] | Freq: Three times a day (TID) | SUBCUTANEOUS | Status: DC
  Administered 2016-09-09: 11 [IU] via SUBCUTANEOUS
  Administered 2016-09-09: 15 [IU] via SUBCUTANEOUS
  Administered 2016-09-09: 11 [IU] via SUBCUTANEOUS
  Administered 2016-09-10 (×2): 8 [IU] via SUBCUTANEOUS
  Administered 2016-09-10 – 2016-09-11 (×3): 11 [IU] via SUBCUTANEOUS
  Administered 2016-09-11: 15 [IU] via SUBCUTANEOUS
  Administered 2016-09-12 – 2016-09-13 (×4): 3 [IU] via SUBCUTANEOUS
  Administered 2016-09-13: 5 [IU] via SUBCUTANEOUS
  Administered 2016-09-13: 3 [IU] via SUBCUTANEOUS
  Administered 2016-09-14: 2 [IU] via SUBCUTANEOUS
  Administered 2016-09-14: 3 [IU] via SUBCUTANEOUS

## 2016-09-08 MED ORDER — BRIMONIDINE TARTRATE 0.2 % OP SOLN
OPHTHALMIC | Status: AC
  Filled 2016-09-08: qty 5

## 2016-09-08 MED ORDER — ASPIRIN 81 MG PO CHEW
81.0000 mg | CHEWABLE_TABLET | Freq: Every day | ORAL | Status: DC
  Administered 2016-09-09 – 2016-09-13 (×5): 81 mg via ORAL
  Filled 2016-09-08 (×6): qty 1

## 2016-09-08 MED ORDER — CLOPIDOGREL BISULFATE 75 MG PO TABS
75.0000 mg | ORAL_TABLET | Freq: Every day | ORAL | Status: DC
  Administered 2016-09-08 – 2016-09-13 (×6): 75 mg via ORAL
  Filled 2016-09-08 (×6): qty 1

## 2016-09-08 MED ORDER — CALCIUM CARBONATE ANTACID 1250 MG/5ML PO SUSP
500.0000 mg | Freq: Four times a day (QID) | ORAL | Status: DC | PRN
  Filled 2016-09-08: qty 5

## 2016-09-08 MED ORDER — LIDOCAINE-EPINEPHRINE (PF) 2 %-1:200000 IJ SOLN
20.0000 mL | Freq: Once | INTRAMUSCULAR | Status: AC
  Administered 2016-09-08: 20 mL
  Filled 2016-09-08: qty 20

## 2016-09-08 MED ORDER — HYDROXYZINE HCL 25 MG PO TABS
25.0000 mg | ORAL_TABLET | Freq: Three times a day (TID) | ORAL | Status: DC | PRN
  Administered 2016-09-11: 25 mg via ORAL
  Filled 2016-09-08: qty 1

## 2016-09-08 MED ORDER — ONDANSETRON HCL 4 MG PO TABS: 4.0000 mg | ORAL_TABLET | Freq: Four times a day (QID) | ORAL | Status: DC | PRN

## 2016-09-08 MED ORDER — FENTANYL CITRATE (PF) 100 MCG/2ML IJ SOLN
12.5000 ug | INTRAMUSCULAR | Status: DC | PRN
  Administered 2016-09-12 – 2016-09-13 (×2): 12.5 ug via INTRAVENOUS
  Filled 2016-09-08 (×2): qty 2

## 2016-09-08 MED ORDER — PREDNISOLONE ACETATE 1 % OP SUSP
1.0000 [drp] | OPHTHALMIC | Status: DC
  Administered 2016-09-08 – 2016-09-14 (×41): 1 [drp] via OPHTHALMIC
  Filled 2016-09-08: qty 1

## 2016-09-08 MED ORDER — VANCOMYCIN HCL IN DEXTROSE 1-5 GM/200ML-% IV SOLN
1000.0000 mg | INTRAVENOUS | Status: DC
  Administered 2016-09-09 – 2016-09-11 (×3): 1000 mg via INTRAVENOUS
  Filled 2016-09-08 (×3): qty 200

## 2016-09-08 MED ORDER — DOCUSATE SODIUM 283 MG RE ENEM
1.0000 | ENEMA | RECTAL | Status: DC | PRN
  Filled 2016-09-08: qty 1

## 2016-09-08 MED ORDER — ZOLPIDEM TARTRATE 5 MG PO TABS
5.0000 mg | ORAL_TABLET | Freq: Every evening | ORAL | Status: DC | PRN
  Administered 2016-09-08 – 2016-09-11 (×3): 5 mg via ORAL
  Filled 2016-09-08 (×3): qty 1

## 2016-09-08 MED ORDER — CAMPHOR-MENTHOL 0.5-0.5 % EX LOTN
1.0000 "application " | TOPICAL_LOTION | Freq: Three times a day (TID) | CUTANEOUS | Status: DC | PRN
  Filled 2016-09-08: qty 222

## 2016-09-08 MED ORDER — HYDRALAZINE HCL 20 MG/ML IJ SOLN: 5.0000 mg | INTRAMUSCULAR | Status: DC | PRN

## 2016-09-08 MED ORDER — LIDOCAINE HCL (PF) 1 % IJ SOLN
INTRAMUSCULAR | Status: AC
  Filled 2016-09-08: qty 10

## 2016-09-08 MED ORDER — CARVEDILOL 12.5 MG PO TABS
25.0000 mg | ORAL_TABLET | Freq: Two times a day (BID) | ORAL | Status: DC
  Administered 2016-09-09 – 2016-09-13 (×9): 25 mg via ORAL
  Filled 2016-09-08 (×10): qty 2

## 2016-09-08 MED ORDER — SORBITOL 70 % SOLN
30.0000 mL | Status: DC | PRN
  Administered 2016-09-12: 30 mL via ORAL
  Filled 2016-09-08 (×2): qty 30

## 2016-09-08 MED ORDER — NEPRO/CARBSTEADY PO LIQD: 237.0000 mL | Freq: Three times a day (TID) | ORAL | Status: DC | PRN

## 2016-09-08 MED ORDER — LATANOPROST 0.005 % OP SOLN
OPHTHALMIC | Status: AC
  Filled 2016-09-08: qty 2.5

## 2016-09-08 MED ORDER — ACETAMINOPHEN 650 MG RE SUPP: 650.0000 mg | Freq: Four times a day (QID) | RECTAL | Status: DC | PRN

## 2016-09-08 MED ORDER — INSULIN ASPART 100 UNIT/ML ~~LOC~~ SOLN
5.0000 [IU] | Freq: Once | SUBCUTANEOUS | Status: AC
  Administered 2016-09-08: 5 [IU] via INTRAVENOUS
  Filled 2016-09-08: qty 1

## 2016-09-08 MED ORDER — PREDNISOLONE ACETATE 1 % OP SUSP
OPHTHALMIC | Status: AC
  Filled 2016-09-08: qty 5

## 2016-09-08 MED ORDER — VANCOMYCIN HCL IN DEXTROSE 1-5 GM/200ML-% IV SOLN
1000.0000 mg | Freq: Once | INTRAVENOUS | Status: AC
  Administered 2016-09-08: 1000 mg via INTRAVENOUS
  Filled 2016-09-08: qty 200

## 2016-09-08 MED ORDER — INSULIN ASPART 100 UNIT/ML ~~LOC~~ SOLN
0.0000 [IU] | Freq: Every day | SUBCUTANEOUS | Status: DC
  Administered 2016-09-08: 4 [IU] via SUBCUTANEOUS
  Administered 2016-09-09 – 2016-09-13 (×4): 2 [IU] via SUBCUTANEOUS

## 2016-09-08 MED ORDER — ONDANSETRON HCL 4 MG/2ML IJ SOLN: 4.0000 mg | Freq: Four times a day (QID) | INTRAMUSCULAR | Status: DC | PRN

## 2016-09-08 MED ORDER — OFLOXACIN 0.3 % OP SOLN
1.0000 [drp] | Freq: Two times a day (BID) | OPHTHALMIC | Status: DC
  Administered 2016-09-08 – 2016-09-13 (×11): 1 [drp] via OPHTHALMIC
  Filled 2016-09-08: qty 5

## 2016-09-08 MED ORDER — ACETAMINOPHEN 325 MG PO TABS
650.0000 mg | ORAL_TABLET | Freq: Four times a day (QID) | ORAL | Status: DC | PRN
  Administered 2016-09-11 – 2016-09-13 (×3): 650 mg via ORAL
  Filled 2016-09-08 (×3): qty 2

## 2016-09-08 MED ORDER — BRIMONIDINE TARTRATE 0.2 % OP SOLN
1.0000 [drp] | Freq: Three times a day (TID) | OPHTHALMIC | Status: DC
  Administered 2016-09-08 – 2016-09-14 (×16): 1 [drp] via OPHTHALMIC
  Filled 2016-09-08: qty 5

## 2016-09-08 MED ORDER — INSULIN ASPART 100 UNIT/ML ~~LOC~~ SOLN
5.0000 [IU] | Freq: Once | SUBCUTANEOUS | Status: DC
  Filled 2016-09-08: qty 1

## 2016-09-08 NOTE — ED Triage Notes (Signed)
Pt brought in by EMS due to boil to right buttocks x 1 week. Pt DM with bilateral amputee and bs 569 in route. A/o.

## 2016-09-08 NOTE — ED Provider Notes (Signed)
61 y/o male Hx of DM on lantus at night Presents with 6 days of worsening swelling and pain to the R buttock Fevers present Sugar elevated On exam has large indurated abscess which had purlent material when I and D's - I assisted with this procedure - expressed purulence and irrigated - this was a large area of induration, no foul smell to purulence.  Vancomycin, fluids and insulin D/w Dr. Lorin Mercy who will admit.  Medical screening examination/treatment/procedure(s) were conducted as a shared visit with non-physician practitioner(s) and myself.  I personally evaluated the patient during the encounter.  Clinical Impression:   Final diagnoses:  Abscess  Hyperglycemia         Noemi Chapel, MD 09/09/16 [phone]

## 2016-09-08 NOTE — ED Provider Notes (Signed)
[facility] DEPT Provider Note   CSN: [id number] Arrival date & time: 09/08/16  1132     History   Chief Complaint Chief Complaint  Patient presents with  . Abscess    HPI [name] is a 61 y.o. male presented with a six-day history of abscess.   Patient states that he first noticed the pain on the left cheek of his buttocks 6 days ago. Since then the pain has gotten worse. On Tuesday he started noticing fevers and chills, and has been intermittently taking Tylenol for this. Since Monday, his blood sugars have been more elevated than usual, although he reports poor control recently, since his switch to 5 units of Lantus in March. Patient denies chest pain, shortness of breath, nausea, vomiting, abdominal pain, or abnormal bowel movements.  PMH significant for DM, ESRD (on dialysis M/W/F), and CHF.     HPI  Past Medical History:  Diagnosis Date  . AICD (automatic cardioverter/defibrillator) present   . Amputated below knee (HCC)    Right  . Anemia   . Arthritis   . Cerebrovascular disease   . CHF (congestive heart failure) ([place])   . Chronic kidney disease    Stage IV  . Diabetes mellitus    type II  . History of TIAs   . Hyperlipidemia   . Hypertension   . Nonischemic cardiomyopathy ([place])   . PVD (peripheral vascular disease) ([place])   . Shortness of breath dyspnea    with exertion   . Skin disease    Rare  . Stroke [facility])    "light stroke"  . Tobacco abuse     Patient Active Problem List   Diagnosis Date Noted  . Acute on chronic combined systolic and diastolic CHF (congestive heart failure) ([place]) 06/20/2016  . Acute hypoxemic respiratory failure ([place]) 06/19/2016  . Volume overload 06/19/2016  . AKI (acute kidney injury) ([place]) 06/19/2016  . DM (diabetes mellitus), type 2 with peripheral vascular complications ([place]) 37/01/6268  . SOB (shortness of breath)   . Chronic kidney disease (CKD), stage IV (severe) ([place]) 03/10/2015  . ACHILLES BURSITIS OR  TENDINITIS 09/14/2009  . PLANTAR FACIITIS 09/14/2009  . TIA 09/07/2009  . Peripheral vascular disease ([place]) 01/07/2009  . HYPERLIPIDEMIA 08/27/2008  . Essential hypertension 08/27/2008    Past Surgical History:  Procedure Laterality Date  . A/V SHUNTOGRAM Left 06/24/2016   Procedure: A/V Fistulagram;  Surgeon: Elam Dutch, MD;  Location: [facility];  Service: Cardiovascular;  Laterality: Left;  . AV FISTULA PLACEMENT Left 04/08/2015   Procedure: Creation of Left arm BRACHIOCEPHALIC ARTERIOVENOUS  FISTULA ;  Surgeon: Mal Misty, MD;  Location: [place];  Service: Vascular;  Laterality: Left;  . CARDIAC DEFIBRILLATOR PLACEMENT    . CARDIAC DEFIBRILLATOR PLACEMENT    . CATARACT EXTRACTION W/PHACO Right 03/17/2015   Procedure: CATARACT EXTRACTION PHACO AND INTRAOCULAR LENS PLACEMENT (IOC);  Surgeon: Rutherford Guys, MD;  Location: [facility];  Service: Ophthalmology;  Laterality: Right;  CDE: 6.59  . EYE SURGERY Right    Cataract  . LEG AMPUTATION BELOW KNEE     bilateral       Home Medications    Prior to Admission medications   Medication Sig Start Date End Date Taking? Authorizing Provider  aspirin 81 MG tablet Take 81 mg by mouth daily.     Yes [provider]  brimonidine (ALPHAGAN) 0.2 % ophthalmic solution Place 1 drop into the right eye every 8 (eight) hours. 07/12/16  Yes [provider]  calcium acetate (PHOSLO) 667 MG capsule Take 667 mg by mouth daily. With largest meal of the day 03/25/15  Yes [provider]  carvedilol (COREG) 25 MG tablet Take 1 tablet (25 mg total) by mouth 2 (two) times daily. 03/02/11  Yes Evans Lance, MD  cloNIDine (CATAPRES) 0.1 MG tablet Take 1 tablet (0.1 mg total) by mouth at bedtime. 06/28/16  Yes Domenic Polite, MD  clopidogrel (PLAVIX) 75 MG tablet Take 75 mg by mouth daily.     Yes [provider]  fenofibrate 160 MG tablet Take 160 mg by mouth daily. 03/09/15  Yes [provider]  folic  acid (FOLVITE) 1 MG tablet Take 1 mg by mouth daily.   Yes [provider]  furosemide (LASIX) 20 MG tablet Take 80 mg by mouth 2 (two) times daily.    Yes [provider]  Insulin Glargine (LANTUS SOLOSTAR) 100 UNIT/ML Solostar Pen Inject 5 Units into the skin at bedtime. 06/28/16  Yes Domenic Polite, MD  latanoprost (XALATAN) 0.005 % ophthalmic solution Place 1 drop into the right eye at bedtime. 07/12/16  Yes [provider]  ofloxacin (OCUFLOX) 0.3 % ophthalmic solution Place 1 drop into the right eye 2 (two) times daily.   Yes [provider]  oxyCODONE (OXY IR/ROXICODONE) 5 MG immediate release tablet Take 1 tablet (5 mg total) by mouth at bedtime as needed for moderate pain. 04/08/15  Yes Laurence Slate M, PA-C  prednisoLONE acetate (PRED FORTE) 1 % ophthalmic suspension Place 1 drop into the right eye every 2 (two) hours while awake.   Yes [provider]  rosuvastatin (CRESTOR) 20 MG tablet Take 20 mg by mouth daily. 02/24/15  Yes [provider]  timolol (TIMOPTIC) 0.5 % ophthalmic solution Place 1 drop into the right eye 2 (two) times daily. 07/12/16  Yes [provider]  acetaminophen (TYLENOL) 500 MG tablet Take 1,000 mg by mouth daily as needed for mild pain or headache.     [provider]  traMADol (ULTRAM) 50 MG tablet Take 1 tablet (50 mg total) by mouth every 6 (six) hours as needed. Patient not taking: Reported on 09/08/2016 05/17/15   Dorie Rank, MD  Vitamin D, Ergocalciferol, (DRISDOL) 50000 UNITS CAPS capsule Take 50,000 Units by mouth once a week. 02/12/15   [provider]    Family History Family History  Problem Relation Age of Onset  . Diabetes Father   . Stroke Father   . Diabetes Brother   . Diabetes Brother   . Diabetes Brother   . Diabetes Brother   . Heart failure Mother   . Diabetes Mother   . Diabetes Other   . Coronary artery disease Other     Social History Social History    Substance Use Topics  . Smoking status: Former Smoker    Packs/day: 1.00    Years: 20.00    Quit date: 03/12/1998  . Smokeless tobacco: Never Used  . Alcohol use No     Allergies   Acetazolamide; Contrast media [iodinated diagnostic agents]; and Other   Review of Systems Review of Systems  Constitutional: Positive for chills and fever.  HENT: Negative for congestion, rhinorrhea and sore throat.   Eyes: Negative for photophobia and visual disturbance.  Respiratory: Negative for cough, chest tightness and shortness of breath.   Cardiovascular: Negative for chest pain and palpitations.  Gastrointestinal: Positive for rectal pain. Negative for abdominal pain, blood in stool, constipation,  diarrhea, nausea and vomiting.  Endocrine: Negative for polydipsia and polyuria.  Genitourinary: Negative for dysuria and hematuria.  Musculoskeletal: Negative for arthralgias, back pain, neck pain and neck stiffness.  Skin: Positive for wound (abscess on R glueteal fold).  Neurological: Negative for dizziness, weakness and headaches.  Hematological: Does not bruise/bleed easily.  All other systems reviewed and are negative.    Physical Exam Updated Vital Signs BP (!) 207/75   Pulse 93   Temp 100.3 F (37.9 C) (Oral)   Resp 16   Ht 6' (1.829 m)   Wt 104.3 kg (230 lb)   SpO2 97%   BMI 31.19 kg/m   Physical Exam  Constitutional: He is oriented to person, place, and time. He appears well-developed and well-nourished. No distress.  HENT:  Head: Normocephalic and atraumatic.  Eyes: Conjunctivae and EOM are normal. Pupils are equal, round, and reactive to light.  Neck: Normal range of motion. Neck supple.  Cardiovascular: Normal rate, regular rhythm, normal heart sounds and intact distal pulses.   Pulmonary/Chest: Effort normal and breath sounds normal.  Abdominal: Soft. Bowel sounds are normal. He exhibits no distension and no mass. There is no tenderness. There is no rebound and no  guarding.  Genitourinary:  Genitourinary Comments: Large indurated abscess on the right gluteal fold. No singular head or site of drainage. On ultrasound, conceivably loculated areas of pus, extending about 4" x 2". Some surrounding erythema of left buttock. No visible involvement with the anus or sphincter.  Neurological: He is alert and oriented to person, place, and time.  Skin: Skin is warm and dry. No rash noted. He is not diaphoretic.  Psychiatric: He has a normal mood and affect.     ED Treatments / Results  Labs (all labs ordered are listed, but only abnormal results are displayed) Labs Reviewed  CBC WITH DIFFERENTIAL/PLATELET - Abnormal; Notable for the following:       Result Value   WBC 11.9 (*)    RBC 3.70 (*)    Hemoglobin 10.8 (*)    HCT 33.7 (*)    Neutro Abs 10.2 (*)    All other components within normal limits  BASIC METABOLIC PANEL - Abnormal; Notable for the following:    Sodium 132 (*)    Chloride 92 (*)    Glucose, Bld 442 (*)    BUN 40 (*)    Creatinine, Ser 3.31 (*)    Calcium 8.5 (*)    GFR calc non Af Amer 18 (*)    GFR calc Af Amer 21 (*)    All other components within normal limits  LACTIC ACID, PLASMA  LACTIC ACID, PLASMA  CBG MONITORING, ED    EKG  EKG Interpretation None       Radiology No results found.  Procedures .Marland KitchenIncision and Drainage Date/Time: 09/08/2016 2:48 PM Performed by: Franchot Heidelberg Authorized by: Franchot Heidelberg   Consent:    Consent obtained:  Verbal   Consent given by:  Patient   Risks discussed:  Bleeding, incomplete drainage, pain and infection Location:    Type:  Abscess   Size:  4 in x 2 in   Location:  Lower extremity   Lower extremity location:  Buttock   Buttock location:  L buttock Pre-procedure details:    Skin preparation:  Betadine Anesthesia (see MAR for exact dosages):    Anesthesia method:  Local infiltration   Local anesthetic:  Lidocaine 2% WITH epi Procedure type:    Complexity:   Complex Procedure  details:    Needle aspiration: no     Incision types:  Single straight   Incision depth:  Subcutaneous   Scalpel blade:  11   Wound management:  Probed and deloculated, irrigated with saline and extensive cleaning   Drainage:  Purulent   Drainage amount:  Moderate   Wound treatment:  Wound left open   Packing materials:  None Post-procedure details:    Patient tolerance of procedure:  Tolerated well, no immediate complications Comments:     Abscess reviewed with ultrasound prior to I&D. Moderate pus expressed, and broke up loculations. Minimal bleeding. Patient tolerated procedure very well.     (including critical care time)  Medications Ordered in ED Medications  sodium chloride 0.9 % bolus 1,000 mL (0 mLs Intravenous Stopped 09/08/16 1447)    Followed by  0.9 %  sodium chloride infusion (0 mLs Intravenous Stopped 09/08/16 1500)  vancomycin (VANCOCIN) IVPB 1000 mg/200 mL premix (1,000 mg Intravenous New Bag/Given 09/08/16 1450)  insulin aspart (novoLOG) injection 5 Units (5 Units Intravenous Given 09/08/16 1407)  lidocaine-EPINEPHrine (XYLOCAINE W/EPI) 2 %-1:200000 (PF) injection 20 mL (20 mLs Infiltration Given 09/08/16 1408)  povidone-iodine (BETADINE) 10 % external solution (1 application  Given 05/12/18 1400)  insulin aspart (novoLOG) injection 5 Units (5 Units Intravenous Given 09/08/16 1449)  sodium chloride 0.9 % bolus 1,000 mL (1,000 mLs Intravenous New Bag/Given 09/08/16 1500)     Initial Impression / Assessment and Plan / ED Course  I have reviewed the triage vital signs and the nursing notes.  Pertinent labs & imaging results that were available during my care of the patient were reviewed by me and considered in my medical decision making (see chart for details).     Patient with fever, leukocytosis, source of infection, and comorbidities. Incision and drainage of the abscess, and antibiotics, insulin, and 2 L fluids given. Due to patient's history,  comorbidities, and lab values, will contact hospitalist for admission and continuation of IV antibiotics. Dr. Lorin Mercy to admit patient. Discussed this with patient, patient agrees to plan.     Final Clinical Impressions(s) / ED Diagnoses   Final diagnoses:  Abscess  Hyperglycemia    New Prescriptions New Prescriptions   No medications on file     Franchot Heidelberg, PA-C 09/08/16 1540    Noemi Chapel, MD 09/09/16 [phone]

## 2016-09-08 NOTE — ED Notes (Signed)
Hospitialist at bedside.  

## 2016-09-08 NOTE — H&P (Addendum)
History and Physical    [name] YTK:[id number] DOB: 11-10-1955 DOA: 09/08/2016  PCP: [name], MD Consultants:  Lowanda Foster - nephrology Patient coming from: Home - lives alone; [facility]: brother, [phone], [phone], [phone]  Chief Complaint: buttocks abscess  HPI: [name] is a 61 y.o. male with medical history significant of HTN; HLD; DM; ESRD on MWF HD; PVD s/p B BKA; combined systolic/diastolic chronic heart failure with AICD presenting with "a boil on my backside ... on the right cheek".  He noticed it about 6 days ago.  "They said I did" have fever here in the ER; he is unaware of whether he had fever at home.  +chills.  Has never been told he has had an MRSA infection. No other lesions.  The lesion has been getting bigger.  It feels better now that they drained it.  Sugar is up and down - highest was 454 today, lowest is 170.   ED Course:  Large indurated abscess was I&D'd with significant fran purulence, then irrigated.  Given Vanc, IVF, insulin.  Review of Systems: As per HPI; otherwise review of systems reviewed and negative.   Ambulatory Status:  Non-ambulatory due to B BKA  Past Medical History:  Diagnosis Date  . AICD (automatic cardioverter/defibrillator) present   . Anemia   . Arthritis   . Cerebrovascular disease   . CHF (congestive heart failure) ([place])   . Chronic kidney disease    ESRD, MWF HD  . Diabetes mellitus    type II  . History of TIAs   . Hyperlipidemia   . Hypertension   . Nonischemic cardiomyopathy ([place])   . PVD (peripheral vascular disease) ([place])    s/p B BKA  . Shortness of breath dyspnea    with exertion   . Skin disease    Rare  . Stroke [facility])    "light stroke"  . Tobacco abuse     Past Surgical History:  Procedure Laterality Date  . A/V SHUNTOGRAM Left 06/24/2016   Procedure: A/V Fistulagram;  Surgeon: Elam Dutch, MD;  Location: [facility];  Service: Cardiovascular;  Laterality: Left;  . AV FISTULA  PLACEMENT Left 04/08/2015   Procedure: Creation of Left arm BRACHIOCEPHALIC ARTERIOVENOUS  FISTULA ;  Surgeon: Mal Misty, MD;  Location: [place];  Service: Vascular;  Laterality: Left;  . CARDIAC DEFIBRILLATOR PLACEMENT    . CARDIAC DEFIBRILLATOR PLACEMENT    . CATARACT EXTRACTION W/PHACO Right 03/17/2015   Procedure: CATARACT EXTRACTION PHACO AND INTRAOCULAR LENS PLACEMENT (IOC);  Surgeon: Rutherford Guys, MD;  Location: [facility];  Service: Ophthalmology;  Laterality: Right;  CDE: 6.59  . EYE SURGERY Right    Cataract  . LEG AMPUTATION BELOW KNEE     bilateral    Social History   Social History  . Marital status: Legally Separated    Spouse name: N/A  . Number of children: N/A  . Years of education: N/A   Occupational History  . Disabled Biochemist, clinical   Social History Main Topics  . Smoking status: Former Smoker    Packs/day: 1.00    Years: 20.00    Quit date: 03/12/1998  . Smokeless tobacco: Never Used  . Alcohol use 0.0 oz/week     Comment: occasional  . Drug use: No  . Sexual activity: Not on file   Other Topics Concern  . Not on file   Social History Narrative   Divorced   No regular exercise   Occasional caffeine  use    Allergies  Allergen Reactions  . Acetazolamide Other (See Comments)    Jittery odd feeling. (hyper feeling)  . Contrast Media [Iodinated Diagnostic Agents] Hives    In the 80's   . Other Other (See Comments)    Transfer Dye---"Makes me tired"    Family History  Problem Relation Age of Onset  . Diabetes Father   . Stroke Father   . Diabetes Brother   . Diabetes Brother   . Diabetes Brother   . Diabetes Brother   . Heart failure Mother   . Diabetes Mother   . Diabetes Other   . Coronary artery disease Other     Prior to Admission medications   Medication Sig Start Date End Date Taking? Authorizing Provider  aspirin 81 MG tablet Take 81 mg by mouth daily.     Yes [provider]  brimonidine (ALPHAGAN) 0.2 % ophthalmic  solution Place 1 drop into the right eye every 8 (eight) hours. 07/12/16  Yes [provider]  calcium acetate (PHOSLO) 667 MG capsule Take 667 mg by mouth daily. With largest meal of the day 03/25/15  Yes [provider]  carvedilol (COREG) 25 MG tablet Take 1 tablet (25 mg total) by mouth 2 (two) times daily. 03/02/11  Yes Evans Lance, MD  cloNIDine (CATAPRES) 0.1 MG tablet Take 1 tablet (0.1 mg total) by mouth at bedtime. 06/28/16  Yes Domenic Polite, MD  clopidogrel (PLAVIX) 75 MG tablet Take 75 mg by mouth daily.     Yes [provider]  fenofibrate 160 MG tablet Take 160 mg by mouth daily. 03/09/15  Yes [provider]  folic acid (FOLVITE) 1 MG tablet Take 1 mg by mouth daily.   Yes [provider]  furosemide (LASIX) 20 MG tablet Take 80 mg by mouth 2 (two) times daily.    Yes [provider]  Insulin Glargine (LANTUS SOLOSTAR) 100 UNIT/ML Solostar Pen Inject 5 Units into the skin at bedtime. 06/28/16  Yes Domenic Polite, MD  latanoprost (XALATAN) 0.005 % ophthalmic solution Place 1 drop into the right eye at bedtime. 07/12/16  Yes [provider]  ofloxacin (OCUFLOX) 0.3 % ophthalmic solution Place 1 drop into the right eye 2 (two) times daily.   Yes [provider]  oxyCODONE (OXY IR/ROXICODONE) 5 MG immediate release tablet Take 1 tablet (5 mg total) by mouth at bedtime as needed for moderate pain. 04/08/15  Yes Laurence Slate M, PA-C  prednisoLONE acetate (PRED FORTE) 1 % ophthalmic suspension Place 1 drop into the right eye every 2 (two) hours while awake.   Yes [provider]  rosuvastatin (CRESTOR) 20 MG tablet Take 20 mg by mouth daily. 02/24/15  Yes [provider]  timolol (TIMOPTIC) 0.5 % ophthalmic solution Place 1 drop into the right eye 2 (two) times daily. 07/12/16  Yes [provider]  acetaminophen (TYLENOL) 500 MG tablet Take 1,000 mg by mouth daily as needed for mild pain or  headache.     [provider]  traMADol (ULTRAM) 50 MG tablet Take 1 tablet (50 mg total) by mouth every 6 (six) hours as needed. Patient not taking: Reported on 09/08/2016 05/17/15   Dorie Rank, MD  Vitamin D, Ergocalciferol, (DRISDOL) 50000 UNITS CAPS capsule Take 50,000 Units by mouth once a week. 02/12/15   [provider]    Physical Exam: Vitals:   09/08/16 1454 09/08/16 1457 09/08/16 1500 09/08/16 1530  BP: (!) 181/80  Marland Kitchen)  207/75 140/62  Pulse: 95  93 87  Resp: 16  16 16   Temp:      TempSrc:      SpO2: 93%  97% 100%  Weight:  104.3 kg (230 lb)    Height:  6' (1.829 m)       General:  Appears calm and comfortable and is NAD; prefers to not bear weight on R buttock Eyes:  EOMI, normal lids, iris ENT:  grossly normal hearing, lips & tongue, mmm Neck:  no LAD, masses or thyromegaly Cardiovascular:  RRR, no m/r/g. No LE edema.  Respiratory:  CTA bilaterally, no w/r/r. Normal respiratory effort. Abdomen:  soft, ntnd, NABS Skin:  Abscess present along right gluteal fold near the rectum, s/p I&D when I saw the patient with an approximately 3 cm surgical incision.  Mild surrounding erythema but this is difficult to see based on the recent I&D and location of the wound. Musculoskeletal:  S/p B BKA.  Stumps appear well healed and without ulceration. Psychiatric:  Flat affect, speech fluent and appropriate, AOx3 Neurologic:  CN 2-12 grossly intact, moves all extremities in coordinated fashion, sensation intact  Labs on Admission: I have personally reviewed following labs and imaging studies  CBC:  Recent Labs Lab 09/08/16 1206  WBC 11.9*  NEUTROABS 10.2*  HGB 10.8*  HCT 33.7*  MCV 91.1  PLT 505   Basic Metabolic Panel:  Recent Labs Lab 09/08/16 1206  NA 132*  K 3.6  CL 92*  CO2 28  GLUCOSE 442*  BUN 40*  CREATININE 3.31*  CALCIUM 8.5*   GFR: Estimated Creatinine Clearance: 28.5 mL/min (A) (by C-G formula based on SCr of 3.31 mg/dL (H)). Liver  Function Tests: No results for input(s): AST, ALT, ALKPHOS, BILITOT, PROT, ALBUMIN in the last 168 hours. No results for input(s): LIPASE, AMYLASE in the last 168 hours. No results for input(s): AMMONIA in the last 168 hours. Coagulation Profile: No results for input(s): INR, PROTIME in the last 168 hours. Cardiac Enzymes: No results for input(s): CKTOTAL, CKMB, CKMBINDEX, TROPONINI in the last 168 hours. BNP (last 3 results) No results for input(s): PROBNP in the last 8760 hours. HbA1C: No results for input(s): HGBA1C in the last 72 hours. CBG:  Recent Labs Lab 09/08/16 1606  GLUCAP 320*   Lipid Profile: No results for input(s): CHOL, HDL, LDLCALC, TRIG, CHOLHDL, LDLDIRECT in the last 72 hours. Thyroid Function Tests: No results for input(s): TSH, T4TOTAL, FREET4, T3FREE, THYROIDAB in the last 72 hours. Anemia Panel: No results for input(s): VITAMINB12, FOLATE, FERRITIN, TIBC, IRON, RETICCTPCT in the last 72 hours. Urine analysis:    Component Value Date/Time   COLORURINE YELLOW 11/18/2010 0135   APPEARANCEUR CLEAR 11/18/2010 0135   LABSPEC 1.010 11/18/2010 0135   PHURINE 6.0 11/18/2010 0135   GLUCOSEU NEGATIVE 11/18/2010 0135   HGBUR NEGATIVE 11/18/2010 0135   BILIRUBINUR NEGATIVE 11/18/2010 0135   KETONESUR NEGATIVE 11/18/2010 0135   PROTEINUR NEGATIVE 11/18/2010 0135   UROBILINOGEN 0.2 11/18/2010 0135   NITRITE NEGATIVE 11/18/2010 0135   LEUKOCYTESUR NEGATIVE 11/18/2010 0135    Creatinine Clearance: Estimated Creatinine Clearance: 28.5 mL/min (A) (by C-G formula based on SCr of 3.31 mg/dL (H)).  Sepsis Labs: @LABRCNTIP (procalcitonin:4,lacticidven:4) )No results found for this or any previous visit (from the past 240 hour(s)).   Radiological Exams on Admission: No results found.  EKG: Not done  Assessment/Plan Principal Problem:   Abscess of buttock, right Active Problems:   Essential hypertension   DM (diabetes mellitus), type 2  with peripheral vascular  complications (HCC)   Chronic combined systolic (congestive) and diastolic (congestive) heart failure (HCC)   ESRD (end stage renal disease) (HCC)   S/P bilateral BKA (below knee amputation) ([place])   Anemia due to chronic kidney disease   Abscess of right buttock -WBC 11.9 -Patient presenting with right buttocks abscess at gluteal fold, s/p I&D -Now that the abscess has drained, antibiotics should be able to clear the remaining infection -He has been given 1 dose of Vancomycin in the ER; based on high likelihood of MRSA, will continue -No current evidence of sepsis -If the infection continues to improve, this is likely to be a fairly brief hospitalization -If the infection worsens or does not improve, he may need surgery consultation for further I&D -Continue home Oxy IR for pain and add prn Fentanyl IV -Cultures pending  DM -Glucose 442 -s/p B BKA -Review of prior A1c does not indicate long-standing poor control, so this may be primarily related to infection -For now, will increase Lantus from 5 to 10 units -Cover with moderate scale SSI  ESRD -BUN 40/creatinine 3.31/GFR 21; prior 40/4.25/16 -He is on MWF HD -Will consult Dr. Lowanda Foster for HD tomorrow -Nephrology prn order set utilized  Anemia -Hgb 10.8; prior 8.8 -Stable, will follow  HTN -Suboptimal control while in the ER, but this may be pain-related -Continue home meds of Coreg, Clonidine -PRN IV Hydralazine for uncontrolled HTN  Heart failure -Echo on 06/19/16 showed EF 35-40% with grade 2 diastolic dysfunction -He has an AICD -Hold Lasix for now given active infection -Continue Coreg; no ACE due to ESRD   DVT prophylaxis: Lovenox  Code Status:  Full - confirmed with patient/family Family Communication: Brother present throughout evaluation Disposition Plan:  Home once clinically improved Consults called: Nephrology for HD tomorrow  Admission status: It is my clinical opinion that referral for OBSERVATION is  reasonable and necessary in this patient based on the above information provided. The aforementioned taken together are felt to place the patient at high risk for further clinical deterioration. However it is anticipated that the patient may be medically stable for discharge from the hospital within 24 to 48 hours.    Karmen Bongo MD [facility]  If 7PM-7AM, please contact night-coverage [email or web address] Password [email or web address]  09/08/2016, 5:10 PM

## 2016-09-08 NOTE — Progress Notes (Signed)
Pharmacy Antibiotic Note  [name] is a 61 y.o. male admitted on 09/08/2016 with cellulitis.  Pharmacy has been consulted for VANCOMYCIN dosing.  Plan: Vancomycin 1000mg  IV q24hrs Monitor labs, progress, c/s  Height: 6' (182.9 cm) Weight: 230 lb (104.3 kg) IBW/kg (Calculated) : 77.6  Temp (24hrs), Avg:100.3 F (37.9 C), Min:100.3 F (37.9 C), Max:100.3 F (37.9 C)   Recent Labs Lab 09/08/16 1206 09/08/16 1214 09/08/16 1443  WBC 11.9*  --   --   CREATININE 3.31*  --   --   LATICACIDVEN  --  1.1 1.2    Estimated Creatinine Clearance: 28.5 mL/min (A) (by C-G formula based on SCr of 3.31 mg/dL (H)).    Allergies  Allergen Reactions  . Acetazolamide Other (See Comments)    Jittery odd feeling. (hyper feeling)  . Contrast Media [Iodinated Diagnostic Agents] Hives    In the 80's   . Other Other (See Comments)    Transfer Dye---"Makes me tired"    Antimicrobials this admission: Vancomycin 6/7 >>   Dose adjustments this admission:  Microbiology results:  BCx: pending  UCx: pending   Sputum:    MRSA PCR:   Thank you for allowing pharmacy to be a part of this patient's care.  Hart Robinsons A 09/08/2016 5:05 PM

## 2016-09-09 DIAGNOSIS — L0231 Cutaneous abscess of buttock: Secondary | ICD-10-CM | POA: Diagnosis not present

## 2016-09-09 DIAGNOSIS — Z7982 Long term (current) use of aspirin: Secondary | ICD-10-CM | POA: Diagnosis not present

## 2016-09-09 DIAGNOSIS — I5042 Chronic combined systolic (congestive) and diastolic (congestive) heart failure: Secondary | ICD-10-CM | POA: Diagnosis not present

## 2016-09-09 DIAGNOSIS — Z79899 Other long term (current) drug therapy: Secondary | ICD-10-CM | POA: Diagnosis not present

## 2016-09-09 DIAGNOSIS — I428 Other cardiomyopathies: Secondary | ICD-10-CM | POA: Diagnosis present

## 2016-09-09 DIAGNOSIS — E785 Hyperlipidemia, unspecified: Secondary | ICD-10-CM | POA: Diagnosis present

## 2016-09-09 DIAGNOSIS — Z9841 Cataract extraction status, right eye: Secondary | ICD-10-CM | POA: Diagnosis not present

## 2016-09-09 DIAGNOSIS — Z8673 Personal history of transient ischemic attack (TIA), and cerebral infarction without residual deficits: Secondary | ICD-10-CM | POA: Diagnosis not present

## 2016-09-09 DIAGNOSIS — Z95810 Presence of automatic (implantable) cardiac defibrillator: Secondary | ICD-10-CM | POA: Diagnosis not present

## 2016-09-09 DIAGNOSIS — L03317 Cellulitis of buttock: Secondary | ICD-10-CM | POA: Diagnosis present

## 2016-09-09 DIAGNOSIS — L0291 Cutaneous abscess, unspecified: Secondary | ICD-10-CM | POA: Diagnosis present

## 2016-09-09 DIAGNOSIS — Z87891 Personal history of nicotine dependence: Secondary | ICD-10-CM | POA: Diagnosis not present

## 2016-09-09 DIAGNOSIS — Z89511 Acquired absence of right leg below knee: Secondary | ICD-10-CM | POA: Diagnosis not present

## 2016-09-09 DIAGNOSIS — Z9842 Cataract extraction status, left eye: Secondary | ICD-10-CM | POA: Diagnosis not present

## 2016-09-09 DIAGNOSIS — M1990 Unspecified osteoarthritis, unspecified site: Secondary | ICD-10-CM | POA: Diagnosis present

## 2016-09-09 DIAGNOSIS — Z961 Presence of intraocular lens: Secondary | ICD-10-CM | POA: Diagnosis present

## 2016-09-09 DIAGNOSIS — N186 End stage renal disease: Secondary | ICD-10-CM | POA: Diagnosis not present

## 2016-09-09 DIAGNOSIS — Z992 Dependence on renal dialysis: Secondary | ICD-10-CM | POA: Diagnosis not present

## 2016-09-09 DIAGNOSIS — N189 Chronic kidney disease, unspecified: Secondary | ICD-10-CM | POA: Diagnosis not present

## 2016-09-09 DIAGNOSIS — E871 Hypo-osmolality and hyponatremia: Secondary | ICD-10-CM | POA: Diagnosis present

## 2016-09-09 DIAGNOSIS — I10 Essential (primary) hypertension: Secondary | ICD-10-CM | POA: Diagnosis not present

## 2016-09-09 DIAGNOSIS — E1165 Type 2 diabetes mellitus with hyperglycemia: Secondary | ICD-10-CM | POA: Diagnosis present

## 2016-09-09 DIAGNOSIS — I132 Hypertensive heart and chronic kidney disease with heart failure and with stage 5 chronic kidney disease, or end stage renal disease: Secondary | ICD-10-CM | POA: Diagnosis present

## 2016-09-09 DIAGNOSIS — D631 Anemia in chronic kidney disease: Secondary | ICD-10-CM | POA: Diagnosis not present

## 2016-09-09 DIAGNOSIS — E1122 Type 2 diabetes mellitus with diabetic chronic kidney disease: Secondary | ICD-10-CM | POA: Diagnosis present

## 2016-09-09 DIAGNOSIS — E1151 Type 2 diabetes mellitus with diabetic peripheral angiopathy without gangrene: Secondary | ICD-10-CM | POA: Diagnosis not present

## 2016-09-09 DIAGNOSIS — Z89512 Acquired absence of left leg below knee: Secondary | ICD-10-CM | POA: Diagnosis not present

## 2016-09-09 LAB — GLUCOSE, CAPILLARY
GLUCOSE-CAPILLARY: 346 mg/dL — AB (ref 65–99)
GLUCOSE-CAPILLARY: 327 mg/dL — AB (ref 65–99)
Glucose-Capillary: 227 mg/dL — ABNORMAL HIGH (ref 65–99)
Glucose-Capillary: 363 mg/dL — ABNORMAL HIGH (ref 65–99)

## 2016-09-09 LAB — CBC
HEMATOCRIT: 30.4 % — AB (ref 39.0–52.0)
HEMOGLOBIN: 10 g/dL — AB (ref 13.0–17.0)
MCH: 29.9 pg (ref 26.0–34.0)
MCHC: 32.9 g/dL (ref 30.0–36.0)
MCV: 90.7 fL (ref 78.0–100.0)
Platelets: 190 10*3/uL (ref 150–400)
RBC: 3.35 MIL/uL — ABNORMAL LOW (ref 4.22–5.81)
RDW: 12.5 % (ref 11.5–15.5)
WBC: 11.5 10*3/uL — AB (ref 4.0–10.5)

## 2016-09-09 LAB — BASIC METABOLIC PANEL
ANION GAP: 10 (ref 5–15)
BUN: 48 mg/dL — ABNORMAL HIGH (ref 6–20)
CALCIUM: 8.2 mg/dL — AB (ref 8.9–10.3)
CO2: 26 mmol/L (ref 22–32)
Chloride: 97 mmol/L — ABNORMAL LOW (ref 101–111)
Creatinine, Ser: 3.68 mg/dL — ABNORMAL HIGH (ref 0.61–1.24)
GFR calc Af Amer: 19 mL/min — ABNORMAL LOW (ref >60)
GFR, EST NON AFRICAN AMERICAN: 16 mL/min — AB (ref >60)
Glucose, Bld: 345 mg/dL — ABNORMAL HIGH (ref 65–99)
POTASSIUM: 3.7 mmol/L (ref 3.5–5.1)
SODIUM: 133 mmol/L — AB (ref 135–145)

## 2016-09-09 MED ORDER — INSULIN GLARGINE 100 UNIT/ML ~~LOC~~ SOLN
20.0000 [IU] | Freq: Every day | SUBCUTANEOUS | Status: DC
  Administered 2016-09-09: 20 [IU] via SUBCUTANEOUS
  Filled 2016-09-09 (×3): qty 0.2

## 2016-09-09 MED ORDER — HEPARIN SODIUM (PORCINE) 1000 UNIT/ML DIALYSIS
20.0000 [IU]/kg | INTRAMUSCULAR | Status: DC | PRN
  Administered 2016-09-12 – 2016-09-14 (×2): 2100 [IU] via INTRAVENOUS_CENTRAL
  Filled 2016-09-09 (×3): qty 3

## 2016-09-09 MED ORDER — INSULIN ASPART 100 UNIT/ML ~~LOC~~ SOLN
4.0000 [IU] | Freq: Three times a day (TID) | SUBCUTANEOUS | Status: DC
  Administered 2016-09-09 – 2016-09-10 (×3): 4 [IU] via SUBCUTANEOUS

## 2016-09-09 MED ORDER — SODIUM CHLORIDE 0.9 % IV SOLN: 100.0000 mL | INTRAVENOUS | Status: DC | PRN

## 2016-09-09 MED ORDER — LIDOCAINE HCL (PF) 1 % IJ SOLN: 5.0000 mL | INTRAMUSCULAR | Status: DC | PRN

## 2016-09-09 MED ORDER — LIDOCAINE-PRILOCAINE 2.5-2.5 % EX CREA: 1.0000 "application " | TOPICAL_CREAM | CUTANEOUS | Status: DC | PRN

## 2016-09-09 MED ORDER — EPOETIN ALFA 4000 UNIT/ML IJ SOLN
4000.0000 [IU] | INTRAMUSCULAR | Status: DC
  Administered 2016-09-12 – 2016-09-14 (×2): 4000 [IU] via SUBCUTANEOUS
  Filled 2016-09-09 (×3): qty 1

## 2016-09-09 MED ORDER — PENTAFLUOROPROP-TETRAFLUOROETH EX AERO: 1.0000 "application " | INHALATION_SPRAY | CUTANEOUS | Status: DC | PRN

## 2016-09-09 NOTE — Care Management Note (Signed)
Case Management Note  Patient Details  Name: [name] MRN: [id number] Date of Birth: March 09, 1956  Subjective/Objective:                  Pt admitted with abcess to his buttock. Pt from home, lives alone and uses a wheelchair for mobility. He has a brother for support but he works full time. He is on HD MWF at [facility] in [place]. He communicates no needs.   Action/Plan: Pt plans to return home with self care. ? Need for NN RN for wound care. CM will follow to DC.   Expected Discharge Date:       09/09/2016           Expected Discharge Plan:  [place]  In-House Referral:  NA  Discharge planning Services  CM Consult  Post Acute Care Choice:  Home Health Choice offered to:  Patient  Status of Service:  In process, will continue to follow   Sherald Barge, RN 09/09/2016, 10:11 AM

## 2016-09-09 NOTE — Progress Notes (Signed)
PROGRESS NOTE    [name]  PFX:[id number] DOB: 1956/04/01 DOA: 09/08/2016 PCP: [name] Gravel, MD    [name] is a 61 y.o. male with medical history significant of HTN; HLD; DM; ESRD on MWF HD; PVD s/p B BKA; combined systolic/diastolic chronic heart failure with AICD admitted for buttock abscess.  He said he is feeling better today, as there has been no chills.  The area is draining, and he is on IV antibiotics. He needs dialysis today, and Dr Everitt Amber is aware.    Assessment & Plan:   Principal Problem:   Abscess of buttock, right Active Problems:   Essential hypertension   DM (diabetes mellitus), type 2 with peripheral vascular complications (HCC)   Chronic combined systolic (congestive) and diastolic (congestive) heart failure (HCC)   ESRD (end stage renal disease) (HCC)   S/P bilateral BKA (below knee amputation) ([name])   Anemia due to chronic kidney disease  Abscess of right buttock -He is doing better as he no longer has chills. It is draining still.  -Continue home Oxy IR for pain and add prn Fentanyl IV -Cultures pending  DM -Glucose 442 -s/p B BKA -Review of prior A1c does not indicate long-standing poor control, so this may be primarily related to infection -For now, will increase Lantus from to 20 units and add meal coverages.  -Cover with moderate scale SSI  ESRD -BUN 40/creatinine 3.31/GFR 21; prior 40/4.25/16 -He is on MWF HD   Anemia -Hgb 10.8; prior 8.8 -Stable, will follow  HTN -Suboptimal control while in the ER, but this may be pain-related -Continue home meds of Coreg, Clonidine -PRN IV Hydralazine for uncontrolled HTN  Heart failure -Echo on 06/19/16 showed EF 35-40% with grade 2 diastolic dysfunction -He has an AICD -Hold Lasix for now given active infection -Continue Coreg; no ACE due to ESRD   DVT prophylaxis: Lovenox  Code Status:  Full - confirmed with patient/family Family Communication: Brother present throughout  evaluation Disposition Plan:  Home once clinically improved Consults called: Nephrology for HD tomorrow   Procedures:   I and D.   Antimicrobials: Anti-infectives    Start     Dose/Rate Route Frequency Ordered Stop   09/09/16 0600  vancomycin (VANCOCIN) IVPB 1000 mg/200 mL premix     1,000 mg 200 mL/hr over 60 Minutes Intravenous Every 24 hours 09/08/16 1705     09/08/16 1430  vancomycin (VANCOCIN) IVPB 1000 mg/200 mL premix     1,000 mg 200 mL/hr over 60 Minutes Intravenous  Once 09/08/16 1417 09/08/16 1603       Subjective  I feel better.    Objective: Vitals:   09/08/16 1700 09/08/16 1800 09/08/16 2052 09/09/16 0651  BP: (!) 161/64 (!) 182/82 (!) 166/63 (!) 156/49  Pulse: 81 87 87 85  Resp:  16 16 16   Temp:   99.5 F (37.5 C) 100.2 F (37.9 C)  TempSrc:   Oral Oral  SpO2: 96% 97% 95% 90%  Weight:      Height:        Intake/Output Summary (Last 24 hours) at 09/09/16 1210 Last data filed at 09/09/16 1000  Gross per 24 hour  Intake             2880 ml  Output              300 ml  Net             2580 ml   Filed Weights   09/08/16  1457  Weight: 104.3 kg (230 lb)    Examination:  General exam: Appears calm and comfortable  Respiratory system: Clear to auscultation. Respiratory effort normal. Cardiovascular system: S1 & S2 heard, RRR. No JVD, murmurs, rubs, gallops or clicks. No pedal edema. Gastrointestinal system: Abdomen is nondistended, soft and nontender. No organomegaly or masses felt. Normal bowel sounds heard. Central nervous system: Alert and oriented. No focal neurological deficits. Extremities: Symmetric 5 x 5 power. Skin: No rashes, lesions or ulcers Psychiatry: Judgement and insight appear normal. Mood & affect appropriate.   Data Reviewed: I have personally reviewed following labs and imaging studies  CBC:  Recent Labs Lab 09/08/16 1206 09/09/16 0438  WBC 11.9* 11.5*  NEUTROABS 10.2*  --   HGB 10.8* 10.0*  HCT 33.7* 30.4*  MCV 91.1  90.7  PLT 201 656   Basic Metabolic Panel:  Recent Labs Lab 09/08/16 1206 09/09/16 0438  NA 132* 133*  K 3.6 3.7  CL 92* 97*  CO2 28 26  GLUCOSE 442* 345*  BUN 40* 48*  CREATININE 3.31* 3.68*  CALCIUM 8.5* 8.2*    Recent Labs Lab 09/08/16 1606 09/08/16 2021 09/09/16 0803 09/09/16 1125  GLUCAP 320* 311* 346* 363*    Recent Labs Lab 09/08/16 1214 09/08/16 1443  LATICACIDVEN 1.1 1.2    Recent Results (from the past 240 hour(s))  Culture, blood (routine x 2)     Status: None (Preliminary result)   Collection Time: 09/08/16  7:57 PM  Result Value Ref Range Status   Specimen Description RIGHT ANTECUBITAL  Final   Special Requests   Final    BOTTLES DRAWN AEROBIC AND ANAEROBIC Blood Culture adequate volume   Culture NO GROWTH < 12 HOURS  Final   Report Status PENDING  Incomplete  Culture, blood (routine x 2)     Status: None (Preliminary result)   Collection Time: 09/08/16  8:34 PM  Result Value Ref Range Status   Specimen Description RIGHT ANTECUBITAL  Final   Special Requests   Final    BOTTLES DRAWN AEROBIC AND ANAEROBIC Blood Culture adequate volume   Culture NO GROWTH < 12 HOURS  Final   Report Status PENDING  Incomplete  MRSA PCR Screening     Status: None   Collection Time: 09/08/16  9:00 PM  Result Value Ref Range Status   MRSA by PCR NEGATIVE NEGATIVE Final    Comment:        The GeneXpert MRSA Assay (FDA approved for NASAL specimens only), is one component of a comprehensive MRSA colonization surveillance program. It is not intended to diagnose MRSA infection nor to guide or monitor treatment for MRSA infections.      Radiology Studies: No results found.  Scheduled Meds: . aspirin  81 mg Oral Daily  . brimonidine  1 drop Right Eye Q8H  . calcium acetate  667 mg Oral Q supper  . carvedilol  25 mg Oral BID WC  . cloNIDine  0.1 mg Oral QHS  . clopidogrel  75 mg Oral QHS  . docusate sodium  100 mg Oral BID  . enoxaparin (LOVENOX)  injection  30 mg Subcutaneous Q24H  . fenofibrate  160 mg Oral Daily  . folic acid  1 mg Oral Daily  . insulin aspart  0-15 Units Subcutaneous TID WC  . insulin aspart  0-5 Units Subcutaneous QHS  . insulin glargine  10 Units Subcutaneous QHS  . latanoprost  1 drop Right Eye QHS  . ofloxacin  1 drop  Right Eye BID  . prednisoLONE acetate  1 drop Right Eye Q2H while awake  . rosuvastatin  20 mg Oral q1800  . timolol  1 drop Right Eye BID   Continuous Infusions: . vancomycin Stopped (09/09/16 0629)     LOS: 0 days   Teriyah Purington, MD FACP Hospitalist.   If 7PM-7AM, please contact night-coverage [email or web address] Password [email or web address] 09/09/2016, 12:10 PM

## 2016-09-09 NOTE — Consult Note (Signed)
[name] MRN: [id number] DOB/AGE: January 21, 1956 61 y.o. Primary Care Physician:Kim, Jeneen Rinks, MD Admit date: 09/08/2016 Chief Complaint:  Chief Complaint  Patient presents with  . Abscess   HPI: Pt is a 61 year old male presented with past medical hx of ESRD who came to ER with c/o  abscess.   HOI dates back to 6-7 days when  Pt  noticed the boil and   pain in the left cheek of his buttocks.Pain was Since profressively getting worse. Pt also started having chills, and has been intermittently taking Tylenol for this. Pt also notoced  his blood sugars have been more elevated than usual. Pt seen today on 3rd floor, pt says : They lanced it in ER, I feel better"  Patient offers no c/o denies chest pain/ shortness of breath NO c/p, nausea/vomiting/ abdominal pain/,  NO c./o abnormal bowel movements.  NO c/o syncope NO c/o reecnt travel  Past Medical History:  Diagnosis Date  . AICD (automatic cardioverter/defibrillator) present   . Anemia   . Arthritis   . Cerebrovascular disease   . CHF (congestive heart failure) ([place])   . Chronic kidney disease    ESRD, MWF HD  . Diabetes mellitus    type II  . History of TIAs   . Hyperlipidemia   . Hypertension   . Nonischemic cardiomyopathy ([place])   . PVD (peripheral vascular disease) ([place])    s/p B BKA  . Shortness of breath dyspnea    with exertion   . Skin disease    Rare  . Stroke [facility])    "light stroke"  . Tobacco abuse         Family History  Problem Relation Age of Onset  . Diabetes Father   . Stroke Father   . Diabetes Brother   . Diabetes Brother   . Diabetes Brother   . Diabetes Brother   . Heart failure Mother   . Diabetes Mother   . Diabetes Other   . Coronary artery disease Other     Social History:  reports that he quit smoking about 18 years ago. He has a 20.00 pack-year smoking history. He has never used smokeless tobacco. He reports that he drinks alcohol. He reports that he does not use  drugs.   Allergies:  Allergies  Allergen Reactions  . Acetazolamide Other (See Comments)    Jittery odd feeling. (hyper feeling)  . Contrast Media [Iodinated Diagnostic Agents] Hives    In the 80's   . Other Other (See Comments)    Transfer Dye---"Makes me tired"    Medications Prior to Admission  Medication Sig Dispense Refill  . aspirin 81 MG tablet Take 81 mg by mouth daily.      . brimonidine (ALPHAGAN) 0.2 % ophthalmic solution Place 1 drop into the right eye every 8 (eight) hours.  1  . calcium acetate (PHOSLO) 667 MG capsule Take 667 mg by mouth daily. With largest meal of the day  0  . carvedilol (COREG) 25 MG tablet Take 1 tablet (25 mg total) by mouth 2 (two) times daily. 60 tablet 0  . cloNIDine (CATAPRES) 0.1 MG tablet Take 1 tablet (0.1 mg total) by mouth at bedtime.    . clopidogrel (PLAVIX) 75 MG tablet Take 75 mg by mouth daily.      . fenofibrate 160 MG tablet Take 160 mg by mouth daily.  0  . folic acid (FOLVITE) 1 MG tablet Take 1 mg by mouth daily.    Marland Kitchen  furosemide (LASIX) 20 MG tablet Take 80 mg by mouth 2 (two) times daily.     . Insulin Glargine (LANTUS SOLOSTAR) 100 UNIT/ML Solostar Pen Inject 5 Units into the skin at bedtime.    Marland Kitchen latanoprost (XALATAN) 0.005 % ophthalmic solution Place 1 drop into the right eye at bedtime.  1  . ofloxacin (OCUFLOX) 0.3 % ophthalmic solution Place 1 drop into the right eye 2 (two) times daily.    Marland Kitchen oxyCODONE (OXY IR/ROXICODONE) 5 MG immediate release tablet Take 1 tablet (5 mg total) by mouth at bedtime as needed for moderate pain. 30 tablet 0  . prednisoLONE acetate (PRED FORTE) 1 % ophthalmic suspension Place 1 drop into the right eye every 2 (two) hours while awake.    . rosuvastatin (CRESTOR) 20 MG tablet Take 20 mg by mouth daily.  0  . timolol (TIMOPTIC) 0.5 % ophthalmic solution Place 1 drop into the right eye 2 (two) times daily.  1  . acetaminophen (TYLENOL) 500 MG tablet Take 1,000 mg by mouth daily as needed for mild  pain or headache.     . traMADol (ULTRAM) 50 MG tablet Take 1 tablet (50 mg total) by mouth every 6 (six) hours as needed. (Patient not taking: Reported on 09/08/2016) 15 tablet 0  . Vitamin D, Ergocalciferol, (DRISDOL) 50000 UNITS CAPS capsule Take 50,000 Units by mouth once a week.  0       OAC:ZYSAY from the symptoms mentioned above,there are no other symptoms referable to all systems reviewed.  Marland Kitchen aspirin  81 mg Oral Daily  . brimonidine  1 drop Right Eye Q8H  . calcium acetate  667 mg Oral Q supper  . carvedilol  25 mg Oral BID WC  . cloNIDine  0.1 mg Oral QHS  . clopidogrel  75 mg Oral QHS  . docusate sodium  100 mg Oral BID  . enoxaparin (LOVENOX) injection  30 mg Subcutaneous Q24H  . fenofibrate  160 mg Oral Daily  . folic acid  1 mg Oral Daily  . insulin aspart  0-15 Units Subcutaneous TID WC  . insulin aspart  0-5 Units Subcutaneous QHS  . insulin glargine  10 Units Subcutaneous QHS  . latanoprost  1 drop Right Eye QHS  . ofloxacin  1 drop Right Eye BID  . prednisoLONE acetate  1 drop Right Eye Q2H while awake  . rosuvastatin  20 mg Oral q1800  . timolol  1 drop Right Eye BID      Physical Exam: Vital signs in last 24 hours: Temp:  [99.5 F (37.5 C)-100.3 F (37.9 C)] 100.2 F (37.9 C) (06/08 0651) Pulse Rate:  [81-95] 85 (06/08 0651) Resp:  [16-20] 16 (06/08 0651) BP: (140-207)/(49-82) 156/49 (06/08 0651) SpO2:  [90 %-100 %] 90 % (06/08 0651) Weight:  [230 lb (104.3 kg)] 230 lb (104.3 kg) (06/07 1457) Weight change:  Last BM Date: 09/07/16  Intake/Output from previous day: 06/07 0701 - 06/08 0700 In: 2760 [P.O.:360; IV Piggyback:2400] Out: -  Total I/O In: -  Out: 300 [Urine:300]   Physical Exam: General- pt is awake,alert, oriented to time place and person Resp- No acute REsp distress,decreased at bases. CVS- S1S2 regular in rate and rhythm GIT- BS+, soft, NT, ND EXT- 2+ LE Edema, NO Cyanosis           b/l bka Access- left AVF + bruit CNS- CN  2-12 grossly intact Psych- normal mod and affect    Lab Results: CBC  Recent Labs  09/08/16 1206 09/09/16 0438  WBC 11.9* 11.5*  HGB 10.8* 10.0*  HCT 33.7* 30.4*  PLT 201 190    BMET  Recent Labs  09/08/16 1206 09/09/16 0438  NA 132* 133*  K 3.6 3.7  CL 92* 97*  CO2 28 26  GLUCOSE 442* 345*  BUN 40* 48*  CREATININE 3.31* 3.68*  CALCIUM 8.5* 8.2*    MICRO Recent Results (from the past 240 hour(s))  Culture, blood (routine x 2)     Status: None (Preliminary result)   Collection Time: 09/08/16  7:57 PM  Result Value Ref Range Status   Specimen Description RIGHT ANTECUBITAL  Final   Special Requests   Final    BOTTLES DRAWN AEROBIC AND ANAEROBIC Blood Culture adequate volume   Culture NO GROWTH < 12 HOURS  Final   Report Status PENDING  Incomplete  Culture, blood (routine x 2)     Status: None (Preliminary result)   Collection Time: 09/08/16  8:34 PM  Result Value Ref Range Status   Specimen Description RIGHT ANTECUBITAL  Final   Special Requests   Final    BOTTLES DRAWN AEROBIC AND ANAEROBIC Blood Culture adequate volume   Culture NO GROWTH < 12 HOURS  Final   Report Status PENDING  Incomplete  MRSA PCR Screening     Status: None   Collection Time: 09/08/16  9:00 PM  Result Value Ref Range Status   MRSA by PCR NEGATIVE NEGATIVE Final    Comment:        The GeneXpert MRSA Assay (FDA approved for NASAL specimens only), is one component of a comprehensive MRSA colonization surveillance program. It is not intended to diagnose MRSA infection nor to guide or monitor treatment for MRSA infections.       Lab Results  Component Value Date   PTH 97 (H) 06/25/2016   CALCIUM 8.2 (L) 09/09/2016   CAION 1.17 10/07/2010   PHOS 3.4 06/29/2016      Impression: 1)Renal  ESRD.                 HD initiated 06/21/16                 Pt is  on HD as outpt ( MOnday/wednesday/Friday schedule)                  CKD since 2012                 CKD secondary to  Ischemic nephropathy/DM/Cardiorenal/HTN                  Progression of CKD as expected with DM                            2)HTN  Medication- On Vasodilators On Alpha and beta Blockers. On [employer].   3)Anemia In ESRD the goal for HGb is 9-11 On Epo during HD  4)CKD Mineral-Bone Disorder Phosphorus  at goal    On binders Calcium at goal after correcting for low albumin  5)ID- admitted with abscess  s/p I & D              Primary MD following  6)Electrolytes  Normokalemic NOrmonatremic   7)Acid base Co2 at goal     Plan:  Will dialyze today Will use 3K bath Will keep on epo     Meeyah Ovitt S 09/09/2016, 9:24 AM

## 2016-09-09 NOTE — Care Management Obs Status (Signed)
MEDICARE OBSERVATION STATUS NOTIFICATION   Patient Details  Name: [name] MRN: [id number] Date of Birth: Oct 04, 1955   Medicare Observation Status Notification Given:  Yes    Sherald Barge, RN 09/09/2016, 10:11 AM

## 2016-09-09 NOTE — Procedures (Signed)
     HEMODIALYSIS TREATMENT NOTE:  4 hour low-heparin dialysis completed via left upper arm AVF (16g/antegrade). Goal met: 2 liters removed without interruption in ultrafiltration.  All blood was returned and hemostasis was achieved in 10 minutes.  Hemodynamically stable throughout HD session.  Report given to Lawernce Keas, Therapist, sports.  Rockwell Alexandria, RN, CDN

## 2016-09-09 NOTE — Progress Notes (Signed)
Inpatient Diabetes Program Recommendations  AACE/ADA: New Consensus Statement on Inpatient Glycemic Control (2015)  Target Ranges:  Prepandial:   less than 140 mg/dL      Peak postprandial:   less than 180 mg/dL (1-2 hours)      Critically ill patients:  140 - 180 mg/dL   Results for [name], [name] (MRN [id number]) as of 09/09/2016 09:52  Ref. Range 09/08/2016 16:06 09/08/2016 20:21 09/09/2016 08:03  Glucose-Capillary Latest Ref Range: 65 - 99 mg/dL 320 (H)  311 (H)  Novolog 4 units  Lantus 10 units 346 (H)  Novolog 11 units   Results for [name], [name] (MRN [id number]) as of 09/09/2016 09:52  Ref. Range 09/08/2016 12:06 09/09/2016 04:38  Glucose Latest Ref Range: 65 - 99 mg/dL 442 (H)  Novolog 10 units @14 :49 345 (H)   Results for [name], [name] (MRN [id number]) as of 09/09/2016 09:52  Ref. Range 11/04/2010 04:45 06/26/2016 11:11  Hemoglobin A1C Latest Ref Range: 4.8 - 5.6 % 10.0 (H) 5.4   Review of Glycemic Control  Diabetes history: DM2 Outpatient Diabetes medications: Lantus 5 units QHS Current orders for Inpatient glycemic control: Lantus 10 units QHS, Novolog 0-15 units TID with meals, Novolog 0-5 units QHS  Inpatient Diabetes Program Recommendations: Insulin - Basal: Please consider increasing Lantus to 20 units QHS. Insulin - Meal Coverage: Please consider ordering Novolog 4 units TID with meals for meal coverage if patient eats at least 50% of meals. HgbA1C: Please consider ordering an A1C to evaluate glycemic control over the past 2-3 months.  Outpatient DM medications: At time of discharge, MD will likely need to adjust outpatient DM medication regimen.  NOTE: In reviewing chart noted patient was hospitalized from 06/19/16 to 06/28/16 and patient was taking Lantus 50 units QHS and Humalog 10 units TID with meals at time of admission on 06/19/16 and per discharge summary on 06/28/16 "on high dose lantus and humalog at home, noted to require very little insulin inpatient, resumed atlantus at  5units QHS, stable, will need dose titration as outpatient". Initial glucose was 442 mg/dl when patient presented to the hospital on 09/08/16. Question if patient has followed up with PCP regarding DM control since last discharge on 06/28/16 when insulin was decreased significantly. Outpatient DM medications will most likely need to be adjusted at time of discharge and patient needs to follow up with PCP in the very near future.   Thanks, Barnie Alderman, RN, MSN, CDE Diabetes Coordinator Inpatient Diabetes Program [phone] (Team Pager from 8am to 5pm)

## 2016-09-10 DIAGNOSIS — L0291 Cutaneous abscess, unspecified: Secondary | ICD-10-CM

## 2016-09-10 LAB — BASIC METABOLIC PANEL
Anion gap: 11 (ref 5–15)
BUN: 37 mg/dL — AB (ref 6–20)
CO2: 30 mmol/L (ref 22–32)
Calcium: 8.2 mg/dL — ABNORMAL LOW (ref 8.9–10.3)
Chloride: 93 mmol/L — ABNORMAL LOW (ref 101–111)
Creatinine, Ser: 3.31 mg/dL — ABNORMAL HIGH (ref 0.61–1.24)
GFR calc Af Amer: 21 mL/min — ABNORMAL LOW (ref >60)
GFR calc non Af Amer: 18 mL/min — ABNORMAL LOW (ref >60)
Glucose, Bld: 348 mg/dL — ABNORMAL HIGH (ref 65–99)
POTASSIUM: 3.5 mmol/L (ref 3.5–5.1)
Sodium: 134 mmol/L — ABNORMAL LOW (ref 135–145)

## 2016-09-10 LAB — GLUCOSE, CAPILLARY
GLUCOSE-CAPILLARY: 279 mg/dL — AB (ref 65–99)
GLUCOSE-CAPILLARY: 242 mg/dL — AB (ref 65–99)
Glucose-Capillary: 327 mg/dL — ABNORMAL HIGH (ref 65–99)
Glucose-Capillary: 300 mg/dL — ABNORMAL HIGH (ref 65–99)

## 2016-09-10 LAB — HEPATITIS B SURFACE ANTIGEN: Hepatitis B Surface Ag: NEGATIVE

## 2016-09-10 LAB — HIV ANTIBODY (ROUTINE TESTING W REFLEX): HIV Screen 4th Generation wRfx: NONREACTIVE

## 2016-09-10 MED ORDER — INSULIN ASPART 100 UNIT/ML ~~LOC~~ SOLN
6.0000 [IU] | Freq: Three times a day (TID) | SUBCUTANEOUS | Status: DC
  Administered 2016-09-10 – 2016-09-11 (×2): 6 [IU] via SUBCUTANEOUS

## 2016-09-10 MED ORDER — INSULIN GLARGINE 100 UNIT/ML ~~LOC~~ SOLN
30.0000 [IU] | Freq: Every day | SUBCUTANEOUS | Status: DC
  Administered 2016-09-10: 30 [IU] via SUBCUTANEOUS
  Filled 2016-09-10 (×2): qty 0.3

## 2016-09-10 NOTE — Progress Notes (Signed)
PROGRESS NOTE    [name]  KKX:[id number] DOB: [birth year] DOA: 09/08/2016 PCP: [name], MD    Brief Narrative: [name] a 61 y.o.malewith medical history significant of HTN; HLD; DM; ESRD on MWF HD; PVD s/p B BKA; combined systolic/diastolic chronic heart failure with AICD admitted for buttock abscess.  He continues to feel better.  He is getting dialysis regularly, and is currently on IV Vancomycin.    Assessment & Plan:   Principal Problem:   Abscess of buttock, right Active Problems:   Essential hypertension   DM (diabetes mellitus), type 2 with peripheral vascular complications (HCC)   Chronic combined systolic (congestive) and diastolic (congestive) heart failure (HCC)   ESRD (end stage renal disease) (HCC)   S/P bilateral BKA (below knee amputation) ([place])   Anemia due to chronic kidney disease   Abscess   1. Buttock abscess:  Lesion is cleaner, less discharge.  Base is good.  Will continue with IV Lucianne Lei.  BC is negative. 2. ESRD on HD:  Continue with routine HD.  Next seesion is due Monday. 3. DM:  Continue with SSI.  Increase basal to 30 Lantus and meals to 6 TID.  4. Anemia:  Hb of 10 and stable. 5. HTN:  Stable. 6. CHF:  Compensated.    DVT prophylaxis:Lovenox  Code Status:Full - confirmed with patient/family Family Communication:Brother present throughout evaluation Disposition Plan:Home once clinically improved Consults called:Nephrology for HD tomorrow  Consultants:   None.   Procedures:   I and D.   Antimicrobials: Anti-infectives    Start     Dose/Rate Route Frequency Ordered Stop   09/09/16 0600  vancomycin (VANCOCIN) IVPB 1000 mg/200 mL premix     1,000 mg 200 mL/hr over 60 Minutes Intravenous Every 24 hours 09/08/16 1705     09/08/16 1430  vancomycin (VANCOCIN) IVPB 1000 mg/200 mL premix     1,000 mg 200 mL/hr over 60 Minutes Intravenous  Once 09/08/16 1417 09/08/16 1603       Subjective:  Doing better.    Objective: Vitals:   09/09/16 2230 09/09/16 2300 09/10/16 0651 09/10/16 1355  BP: (!) 161/87 (!) 150/88 (!) 124/59 (!) 143/70  Pulse: 90 87 75 73  Resp:   15 16  Temp:   99.5 F (37.5 C) 98.6 F (37 C)  TempSrc:   Oral Oral  SpO2:   93% 92%  Weight:      Height:        Intake/Output Summary (Last 24 hours) at 09/10/16 1611 Last data filed at 09/10/16 1300  Gross per 24 hour  Intake              800 ml  Output             2000 ml  Net            -1200 ml   Filed Weights   09/08/16 1457 09/09/16 1855  Weight: 104.3 kg (230 lb) 108.3 kg (238 lb 11.2 oz)    Examination:  General exam: Appears calm and comfortable  Respiratory system: Clear to auscultation. Respiratory effort normal. Cardiovascular system: S1 & S2 heard, RRR. No JVD, murmurs, rubs, gallops or clicks. No pedal edema. Gastrointestinal system: Abdomen is nondistended, soft and nontender. No organomegaly or masses felt. Normal bowel sounds heard. Central nervous system: Alert and oriented. No focal neurological deficits. Extremities: Symmetric 5 x 5 power. Skin: No discharge from the wound.  Base is clean.   No erythema. Psychiatry: Judgement  and insight appear normal. Mood & affect appropriate.   Data Reviewed: I have personally reviewed following labs and imaging studies  CBC:  Recent Labs Lab 09/08/16 1206 09/09/16 0438  WBC 11.9* 11.5*  NEUTROABS 10.2*  --   HGB 10.8* 10.0*  HCT 33.7* 30.4*  MCV 91.1 90.7  PLT 201 865   Basic Metabolic Panel:  Recent Labs Lab 09/08/16 1206 09/09/16 0438 09/10/16 0730  NA 132* 133* 134*  K 3.6 3.7 3.5  CL 92* 97* 93*  CO2 28 26 30   GLUCOSE 442* 345* 348*  BUN 40* 48* 37*  CREATININE 3.31* 3.68* 3.31*  CALCIUM 8.5* 8.2* 8.2*   CBG:  Recent Labs Lab 09/09/16 1125 09/09/16 1640 09/09/16 2340 09/10/16 0733 09/10/16 1143  GLUCAP 363* 327* 227* 327* 300*   Sepsis Labs:  Recent Labs Lab 09/08/16 1214 09/08/16 1443  LATICACIDVEN 1.1 1.2     Recent Results (from the past 240 hour(s))  Culture, blood (routine x 2)     Status: None (Preliminary result)   Collection Time: 09/08/16  7:57 PM  Result Value Ref Range Status   Specimen Description RIGHT ANTECUBITAL  Final   Special Requests   Final    BOTTLES DRAWN AEROBIC AND ANAEROBIC Blood Culture adequate volume   Culture NO GROWTH 2 DAYS  Final   Report Status PENDING  Incomplete  Culture, blood (routine x 2)     Status: None (Preliminary result)   Collection Time: 09/08/16  8:34 PM  Result Value Ref Range Status   Specimen Description RIGHT ANTECUBITAL  Final   Special Requests   Final    BOTTLES DRAWN AEROBIC AND ANAEROBIC Blood Culture adequate volume   Culture NO GROWTH 2 DAYS  Final   Report Status PENDING  Incomplete  MRSA PCR Screening     Status: None   Collection Time: 09/08/16  9:00 PM  Result Value Ref Range Status   MRSA by PCR NEGATIVE NEGATIVE Final    Comment:        The GeneXpert MRSA Assay (FDA approved for NASAL specimens only), is one component of a comprehensive MRSA colonization surveillance program. It is not intended to diagnose MRSA infection nor to guide or monitor treatment for MRSA infections.      Radiology Studies: No results found.  Scheduled Meds: . aspirin  81 mg Oral Daily  . brimonidine  1 drop Right Eye Q8H  . calcium acetate  667 mg Oral Q supper  . carvedilol  25 mg Oral BID WC  . cloNIDine  0.1 mg Oral QHS  . clopidogrel  75 mg Oral QHS  . docusate sodium  100 mg Oral BID  . enoxaparin (LOVENOX) injection  30 mg Subcutaneous Q24H  . [START ON 09/12/2016] epoetin (EPOGEN/PROCRIT) injection  4,000 Units Subcutaneous Q M,W,F-HD  . fenofibrate  160 mg Oral Daily  . folic acid  1 mg Oral Daily  . insulin aspart  0-15 Units Subcutaneous TID WC  . insulin aspart  0-5 Units Subcutaneous QHS  . insulin aspart  4 Units Subcutaneous TID WC  . insulin glargine  20 Units Subcutaneous QHS  . latanoprost  1 drop Right Eye QHS   . ofloxacin  1 drop Right Eye BID  . prednisoLONE acetate  1 drop Right Eye Q2H while awake  . rosuvastatin  20 mg Oral q1800  . timolol  1 drop Right Eye BID   Continuous Infusions: . sodium chloride    . sodium chloride    .  vancomycin Stopped (09/10/16 0710)     LOS: 1 day   [name] Kirkey, MD FACP Hospitalist.   If 7PM-7AM, please contact night-coverage [email or web address] Password [email or web address] 09/10/2016, 4:11 PM

## 2016-09-10 NOTE — Progress Notes (Signed)
[name]  MRN: [id number]  DOB/AGE: [birth year] 63 y.o.  Primary Care Physician:Kim, Jeneen Rinks, MD  Admit date: 09/08/2016  Chief Complaint:  Chief Complaint  Patient presents with  . Abscess    S-Pt presented on  09/08/2016 with  Chief Complaint  Patient presents with  . Abscess  .    Pt offers no new complaints .  Meds . aspirin  81 mg Oral Daily  . brimonidine  1 drop Right Eye Q8H  . calcium acetate  667 mg Oral Q supper  . carvedilol  25 mg Oral BID WC  . cloNIDine  0.1 mg Oral QHS  . clopidogrel  75 mg Oral QHS  . docusate sodium  100 mg Oral BID  . enoxaparin (LOVENOX) injection  30 mg Subcutaneous Q24H  . [START ON 09/12/2016] epoetin (EPOGEN/PROCRIT) injection  4,000 Units Subcutaneous Q M,W,F-HD  . fenofibrate  160 mg Oral Daily  . folic acid  1 mg Oral Daily  . insulin aspart  0-15 Units Subcutaneous TID WC  . insulin aspart  0-5 Units Subcutaneous QHS  . insulin aspart  6 Units Subcutaneous TID WC  . insulin glargine  30 Units Subcutaneous QHS  . latanoprost  1 drop Right Eye QHS  . ofloxacin  1 drop Right Eye BID  . prednisoLONE acetate  1 drop Right Eye Q2H while awake  . rosuvastatin  20 mg Oral q1800  . timolol  1 drop Right Eye BID         Physical Exam: Vital signs in last 24 hours: Temp:  [98.6 F (37 C)-99.5 F (37.5 C)] 98.6 F (37 C) (06/09 1355) Pulse Rate:  [73-90] 73 (06/09 1355) Resp:  [15-16] 16 (06/09 1355) BP: (124-198)/(59-98) 143/70 (06/09 1355) SpO2:  [92 %-99 %] 92 % (06/09 1355) Weight:  [238 lb 11.2 oz (108.3 kg)] 238 lb 11.2 oz (108.3 kg) (06/08 1855) Weight change: 8 lb 11.2 oz (3.946 kg) Last BM Date: 09/07/16  Intake/Output from previous day: 06/08 0701 - 06/09 0700 In: 320 [P.O.:120; IV Piggyback:200] Out: 2300 [Urine:300] Total I/O In: 600 [P.O.:600] Out: -    Physical Exam: General- pt is awake,alert, oriented to time place and person Resp- No acute REsp distress,decreased at bases. CVS- S1S2 regular in  rate and rhythm GIT- BS+, soft, NT, ND EXT- 2+ LE Edema, NO Cyanosis           b/l bka Access- left AVF + bruit  Lab Results: CBC  Recent Labs  09/08/16 1206 09/09/16 0438  WBC 11.9* 11.5*  HGB 10.8* 10.0*  HCT 33.7* 30.4*  PLT 201 190    BMET  Recent Labs  09/09/16 0438 09/10/16 0730  NA 133* 134*  K 3.7 3.5  CL 97* 93*  CO2 26 30  GLUCOSE 345* 348*  BUN 48* 37*  CREATININE 3.68* 3.31*  CALCIUM 8.2* 8.2*   Creat trend 2018  5.98-6.2 2016  4.53 2013   3.46 2012   2.0--3.0    MICRO Recent Results (from the past 240 hour(s))  Culture, blood (routine x 2)     Status: None (Preliminary result)   Collection Time: 09/08/16  7:57 PM  Result Value Ref Range Status   Specimen Description RIGHT ANTECUBITAL  Final   Special Requests   Final    BOTTLES DRAWN AEROBIC AND ANAEROBIC Blood Culture adequate volume   Culture NO GROWTH 2 DAYS  Final   Report Status PENDING  Incomplete  Culture, blood (routine x 2)  Status: None (Preliminary result)   Collection Time: 09/08/16  8:34 PM  Result Value Ref Range Status   Specimen Description RIGHT ANTECUBITAL  Final   Special Requests   Final    BOTTLES DRAWN AEROBIC AND ANAEROBIC Blood Culture adequate volume   Culture NO GROWTH 2 DAYS  Final   Report Status PENDING  Incomplete  MRSA PCR Screening     Status: None   Collection Time: 09/08/16  9:00 PM  Result Value Ref Range Status   MRSA by PCR NEGATIVE NEGATIVE Final    Comment:        The GeneXpert MRSA Assay (FDA approved for NASAL specimens only), is one component of a comprehensive MRSA colonization surveillance program. It is not intended to diagnose MRSA infection nor to guide or monitor treatment for MRSA infections.       Lab Results  Component Value Date   PTH 97 (H) 06/25/2016   CALCIUM 8.2 (L) 09/10/2016   CAION 1.17 10/07/2010   PHOS 3.4 06/29/2016     Impression: 1)Renal  ESRD.                HD initiated 06/21/16   Pt  is  on HD as outpt ( MOnday/wednesday/Friday schedule)    CKD since 2012   CKD secondary to Ischemic nephropathy/DM/Cardiorenal/HTN   Progression of CKD as expected with DM                         Pt was last dialyzed yesterday.   2)HTN  Medication- On Vasodilators On Alpha and beta Blockers. On [employer].   3)Anemia In ESRD the goal for HGb is 9-11 On Epo during HD  4)CKD Mineral-Bone Disorder Phosphorus  at goal On binders Calcium at goal after correcting for low albumin  5)ID- admitted with abscess  s/p I & D Primary MD following  6)Electrolytes  Normokalemic  Hyponatremic   7)Acid base Co2 at goal  Plan:  Will continue current care. No need of HD today  Lestine Rahe S 09/10/2016, 4:34 PM

## 2016-09-11 LAB — GLUCOSE, CAPILLARY
GLUCOSE-CAPILLARY: 187 mg/dL — AB (ref 65–99)
Glucose-Capillary: 306 mg/dL — ABNORMAL HIGH (ref 65–99)
Glucose-Capillary: 360 mg/dL — ABNORMAL HIGH (ref 65–99)
Glucose-Capillary: 302 mg/dL — ABNORMAL HIGH (ref 65–99)

## 2016-09-11 MED ORDER — INSULIN ASPART 100 UNIT/ML ~~LOC~~ SOLN
8.0000 [IU] | Freq: Three times a day (TID) | SUBCUTANEOUS | Status: DC
  Administered 2016-09-11 – 2016-09-14 (×10): 8 [IU] via SUBCUTANEOUS

## 2016-09-11 MED ORDER — INSULIN GLARGINE 100 UNIT/ML ~~LOC~~ SOLN
34.0000 [IU] | Freq: Every day | SUBCUTANEOUS | Status: DC
  Administered 2016-09-11 – 2016-09-13 (×3): 34 [IU] via SUBCUTANEOUS
  Filled 2016-09-11 (×4): qty 0.34

## 2016-09-11 NOTE — Progress Notes (Signed)
PROGRESS NOTE    [name]  JAS:[id number] DOB: 1955/06/21 DOA: 09/08/2016 PCP: [name], MD    Brief Narrative:  [name] Kocher Nobleis a 61 y.o.malewith medical history significant of HTN; HLD; DM; ESRD on MWF HD; PVD s/p B BKA; combined systolic/diastolic chronic heart failure with AICD admitted for buttock abscess. He continues to feel better.  He is getting dialysis regularly, and is currently on IV Vancomycin. He is due for dialysis tomorrow.    Assessment & Plan:   Principal Problem:   Abscess of buttock, right Active Problems:   Essential hypertension   DM (diabetes mellitus), type 2 with peripheral vascular complications (HCC)   Chronic combined systolic (congestive) and diastolic (congestive) heart failure (HCC)   ESRD (end stage renal disease) (HCC)   S/P bilateral BKA (below knee amputation) ([place])   Anemia due to chronic kidney disease   Abscess  1. Buttock abscess:  Lesion is cleaner, less discharge.  Base is good.  Will continue with IV Lucianne Lei.  BC is negative. Plan to discharge home tomorrow and follow up with Dr [name]. I spoke with Dr [name], who will see him outpatient.  2. ESRD on HD:  Continue with routine HD.  Next seesion is due Monday. 3. DM:  Continue with SSI.  Increase basal to 34 Lantus and meals to 8 TID. CBG still over 300's. 4. Anemia:  Hb of 10 and stable. 5. HTN:  Stable. 6. CHF:  Compensated.    DVT prophylaxis:Lovenox  Code Status:Full - confirmed with patient/family Family Communication:Brother present throughout evaluation Disposition Plan:Home once clinically improved Consults called:Nephrology for HD tomorrow  Consultants:   Renal.  Procedures:   I and D.   Antimicrobials: Anti-infectives    Start     Dose/Rate Route Frequency Ordered Stop   09/09/16 0600  vancomycin (VANCOCIN) IVPB 1000 mg/200 mL premix     1,000 mg 200 mL/hr over 60 Minutes Intravenous Every 24 hours 09/08/16 1705     09/08/16 1430  vancomycin  (VANCOCIN) IVPB 1000 mg/200 mL premix     1,000 mg 200 mL/hr over 60 Minutes Intravenous  Once 09/08/16 1417 09/08/16 1603       Subjective:  Doing better.  Doesn't think he is ready to go home yet.   Objective: Vitals:   09/10/16 0651 09/10/16 1355 09/10/16 2147 09/11/16 0603  BP: (!) 124/59 (!) 143/70 (!) 142/58 132/61  Pulse: 75 73 72 73  Resp: 15 16 20 20   Temp: 99.5 F (37.5 C) 98.6 F (37 C) 99 F (37.2 C) 98.9 F (37.2 C)  TempSrc: Oral Oral Oral   SpO2: 93% 92% 96% 98%  Weight:      Height:        Intake/Output Summary (Last 24 hours) at 09/11/16 1108 Last data filed at 09/11/16 0917  Gross per 24 hour  Intake              720 ml  Output              200 ml  Net              520 ml   Filed Weights   09/08/16 1457 09/09/16 1855  Weight: 104.3 kg (230 lb) 108.3 kg (238 lb 11.2 oz)    Examination:  General exam: Appears calm and comfortable  Respiratory system: Clear to auscultation. Respiratory effort normal. Cardiovascular system: S1 & S2 heard, RRR. No JVD, murmurs, rubs, gallops or clicks. No pedal edema. Gastrointestinal system:  Abdomen is nondistended, soft and nontender. No organomegaly or masses felt. Normal bowel sounds heard. Central nervous system: Alert and oriented. No focal neurological deficits. Extremities: Symmetric 5 x 5 power. Skin: No discharge from the wound.  Base is clean.   No erythema. Psychiatry: Judgement and insight appear normal. Mood & affect appropriate.  Data Reviewed: I have personally reviewed following labs and imaging studies  CBC:  Recent Labs Lab 09/08/16 1206 09/09/16 0438  WBC 11.9* 11.5*  NEUTROABS 10.2*  --   HGB 10.8* 10.0*  HCT 33.7* 30.4*  MCV 91.1 90.7  PLT 201 010   Basic Metabolic Panel:  Recent Labs Lab 09/08/16 1206 09/09/16 0438 09/10/16 0730  NA 132* 133* 134*  K 3.6 3.7 3.5  CL 92* 97* 93*  CO2 28 26 30   GLUCOSE 442* 345* 348*  BUN 40* 48* 37*  CREATININE 3.31* 3.68* 3.31*  CALCIUM  8.5* 8.2* 8.2*   GFR: CBG:  Recent Labs Lab 09/10/16 0733 09/10/16 1143 09/10/16 1619 09/10/16 2144 09/11/16 0759  GLUCAP 327* 300* 279* 242* 306*    Recent Labs Lab 09/08/16 1214 09/08/16 1443  LATICACIDVEN 1.1 1.2    Recent Results (from the past 240 hour(s))  Culture, blood (routine x 2)     Status: None (Preliminary result)   Collection Time: 09/08/16  7:57 PM  Result Value Ref Range Status   Specimen Description RIGHT ANTECUBITAL  Final   Special Requests   Final    BOTTLES DRAWN AEROBIC AND ANAEROBIC Blood Culture adequate volume   Culture NO GROWTH 3 DAYS  Final   Report Status PENDING  Incomplete  Culture, blood (routine x 2)     Status: None (Preliminary result)   Collection Time: 09/08/16  8:34 PM  Result Value Ref Range Status   Specimen Description RIGHT ANTECUBITAL  Final   Special Requests   Final    BOTTLES DRAWN AEROBIC AND ANAEROBIC Blood Culture adequate volume   Culture NO GROWTH 3 DAYS  Final   Report Status PENDING  Incomplete  MRSA PCR Screening     Status: None   Collection Time: 09/08/16  9:00 PM  Result Value Ref Range Status   MRSA by PCR NEGATIVE NEGATIVE Final    Comment:        The GeneXpert MRSA Assay (FDA approved for NASAL specimens only), is one component of a comprehensive MRSA colonization surveillance program. It is not intended to diagnose MRSA infection nor to guide or monitor treatment for MRSA infections.      Radiology Studies: No results found.  Scheduled Meds: . aspirin  81 mg Oral Daily  . brimonidine  1 drop Right Eye Q8H  . calcium acetate  667 mg Oral Q supper  . carvedilol  25 mg Oral BID WC  . cloNIDine  0.1 mg Oral QHS  . clopidogrel  75 mg Oral QHS  . docusate sodium  100 mg Oral BID  . enoxaparin (LOVENOX) injection  30 mg Subcutaneous Q24H  . [START ON 09/12/2016] epoetin (EPOGEN/PROCRIT) injection  4,000 Units Subcutaneous Q M,W,F-HD  . fenofibrate  160 mg Oral Daily  . folic acid  1 mg Oral  Daily  . insulin aspart  0-15 Units Subcutaneous TID WC  . insulin aspart  0-5 Units Subcutaneous QHS  . insulin aspart  6 Units Subcutaneous TID WC  . insulin glargine  30 Units Subcutaneous QHS  . latanoprost  1 drop Right Eye QHS  . ofloxacin  1 drop Right Eye  BID  . prednisoLONE acetate  1 drop Right Eye Q2H while awake  . rosuvastatin  20 mg Oral q1800  . timolol  1 drop Right Eye BID   Continuous Infusions: . sodium chloride    . sodium chloride    . vancomycin Stopped (09/11/16 0737)     LOS: 2 days   Aldeen Riga, MD FACP Hospitalist.   If 7PM-7AM, please contact night-coverage [email or web address] Password [email or web address] 09/11/2016, 11:08 AM

## 2016-09-11 NOTE — Progress Notes (Signed)
Subjective: Interval History: has no complaint of nausea or vomiting. His appetite is good..  Objective: Vital signs in last 24 hours: Temp:  [98.6 F (37 C)-99 F (37.2 C)] 98.9 F (37.2 C) (06/10 0603) Pulse Rate:  [72-73] 73 (06/10 0603) Resp:  [16-20] 20 (06/10 0603) BP: (132-143)/(58-70) 132/61 (06/10 0603) SpO2:  [92 %-98 %] 98 % (06/10 0603) Weight change:   Intake/Output from previous day: 06/09 0701 - 06/10 0700 In: 840 [P.O.:840] Out: 200 [Urine:200] Intake/Output this shift: Total I/O In: 240 [P.O.:240] Out: -   General appearance: alert, cooperative and no distress Resp: clear to auscultation bilaterally Cardio: regular rate and rhythm Extremities: edema No edema. Patient status post right BKA and left AKA  Lab Results:  Recent Labs  09/08/16 1206 09/09/16 0438  WBC 11.9* 11.5*  HGB 10.8* 10.0*  HCT 33.7* 30.4*  PLT 201 190   BMET:  Recent Labs  09/09/16 0438 09/10/16 0730  NA 133* 134*  K 3.7 3.5  CL 97* 93*  CO2 26 30  GLUCOSE 345* 348*  BUN 48* 37*  CREATININE 3.68* 3.31*  CALCIUM 8.2* 8.2*   No results for input(s): PTH in the last 72 hours. Iron Studies: No results for input(s): IRON, TIBC, TRANSFERRIN, FERRITIN in the last 72 hours.  Studies/Results: No results found.  I have reviewed the patient's current medications.  Assessment/Plan: Problem #1 end-stage renal disease: Status post hemodialysis on Friday. Presently patient does not have any uremic sinus symptoms. His last potassium was 3-5. Problem #2 history of abscess of his left buttock: Presently patient on antibiotics. Pain is getting better. He is a febrile. Problem #3 anemia: His hemoglobin is within nor target goal Problem #4 metabolic bone disease: Calcium is range. Patient is on PhosLo as a binder. Problem #5 history of hypertension: His blood pressure is reasonably controlled  Problem #6 diabetes Problem #7 peripheral vascular disease Plan: We'll make arrangements  for patient to get dialysis tomorrow. It is his  regular schedule. We'll check his renal panel and CBC in the morning.   LOS: 2 days   Reilyn Nelson S 09/11/2016,9:34 AM

## 2016-09-12 LAB — VANCOMYCIN, TROUGH: VANCOMYCIN TR: 30 ug/mL — AB (ref 15–20)

## 2016-09-12 LAB — GLUCOSE, CAPILLARY
GLUCOSE-CAPILLARY: 177 mg/dL — AB (ref 65–99)
GLUCOSE-CAPILLARY: 160 mg/dL — AB (ref 65–99)
GLUCOSE-CAPILLARY: 223 mg/dL — AB (ref 65–99)
Glucose-Capillary: 165 mg/dL — ABNORMAL HIGH (ref 65–99)

## 2016-09-12 LAB — CBC
HEMATOCRIT: 27.4 % — AB (ref 39.0–52.0)
HEMOGLOBIN: 9 g/dL — AB (ref 13.0–17.0)
MCH: 29.9 pg (ref 26.0–34.0)
MCHC: 32.8 g/dL (ref 30.0–36.0)
MCV: 91 fL (ref 78.0–100.0)
Platelets: 231 10*3/uL (ref 150–400)
RBC: 3.01 MIL/uL — AB (ref 4.22–5.81)
RDW: 12.4 % (ref 11.5–15.5)
WBC: 9.5 10*3/uL (ref 4.0–10.5)

## 2016-09-12 LAB — RENAL FUNCTION PANEL
ANION GAP: 11 (ref 5–15)
Albumin: 2.2 g/dL — ABNORMAL LOW (ref 3.5–5.0)
BUN: 68 mg/dL — ABNORMAL HIGH (ref 6–20)
CO2: 28 mmol/L (ref 22–32)
Calcium: 8.3 mg/dL — ABNORMAL LOW (ref 8.9–10.3)
Chloride: 92 mmol/L — ABNORMAL LOW (ref 101–111)
Creatinine, Ser: 5.13 mg/dL — ABNORMAL HIGH (ref 0.61–1.24)
GFR calc non Af Amer: 11 mL/min — ABNORMAL LOW (ref >60)
GFR, EST AFRICAN AMERICAN: 13 mL/min — AB (ref >60)
GLUCOSE: 192 mg/dL — AB (ref 65–99)
Phosphorus: 3.3 mg/dL (ref 2.5–4.6)
Potassium: 3.5 mmol/L (ref 3.5–5.1)
SODIUM: 131 mmol/L — AB (ref 135–145)

## 2016-09-12 MED ORDER — EPOETIN ALFA 4000 UNIT/ML IJ SOLN: 4000.0000 [IU] | INTRAMUSCULAR | 30 refills | Status: DC

## 2016-09-12 MED ORDER — AMOXICILLIN-POT CLAVULANATE 500-125 MG PO TABS
1.0000 | ORAL_TABLET | Freq: Every evening | ORAL | Status: DC
  Administered 2016-09-12 – 2016-09-13 (×2): 500 mg via ORAL
  Filled 2016-09-12 (×2): qty 1

## 2016-09-12 MED ORDER — NEPRO/CARBSTEADY PO LIQD: 237.0000 mL | Freq: Three times a day (TID) | ORAL | 0 refills | Status: DC | PRN

## 2016-09-12 MED ORDER — HYDROXYZINE HCL 25 MG PO TABS: 25.0000 mg | ORAL_TABLET | Freq: Three times a day (TID) | ORAL | 0 refills | Status: DC | PRN

## 2016-09-12 MED ORDER — CAMPHOR-MENTHOL 0.5-0.5 % EX LOTN: 1.0000 "application " | TOPICAL_LOTION | Freq: Three times a day (TID) | CUTANEOUS | 0 refills | Status: DC | PRN

## 2016-09-12 MED ORDER — AMOXICILLIN-POT CLAVULANATE 500-125 MG PO TABS: 1.0000 | ORAL_TABLET | Freq: Every evening | ORAL | 0 refills | Status: DC

## 2016-09-12 MED ORDER — EPOETIN ALFA 4000 UNIT/ML IJ SOLN
INTRAMUSCULAR | Status: AC
  Administered 2016-09-12: 4000 [IU] via SUBCUTANEOUS
  Filled 2016-09-12: qty 1

## 2016-09-12 MED ORDER — HEPARIN SODIUM (PORCINE) 1000 UNIT/ML IJ SOLN
INTRAMUSCULAR | Status: AC
Start: 2016-09-12 — End: 2016-09-12
  Administered 2016-09-12: 2100 [IU] via INTRAVENOUS_CENTRAL
  Filled 2016-09-12: qty 3

## 2016-09-12 NOTE — Procedures (Signed)
    HEMODIALYSIS TREATMENT NOTE:  4 hour low-heparin dialysis completed via left upper arm AVF (15g/antegrade). Goal nearly met: 1.9 liters.  Ultrafiltration was interrupted x 15 min for symptomatic hypotension relieved with NS bolus.  All blood was returned and hemostasis was achieved within 10 minutes.  Report given to Weyman Pedro, RN.   Rockwell Alexandria, RN, CDN

## 2016-09-12 NOTE — Progress Notes (Signed)
MD notified. Vancomycin not given due to Vanc trough of 30 per order. Awaiting response.

## 2016-09-12 NOTE — Progress Notes (Signed)
PT Cancellation Note  Patient Details Name: [name] MRN: [id number] DOB: [birth year]   Cancelled Treatment:    Reason Eval/Treat Not Completed: Fatigue/lethargy limiting ability to participate (Pt completed HD about 90 minutes and is fatigued at this time. This is not a favorable time to evaluate the patient's best mobility potential. Will hold PT treatment and attempt again at later date/time. ) PT evaluation attempted 2x today, first during HD, and second attempt immediately after. Subjective information collected regarding patients PLOF and home details. Pt agreeable to attempt OOB mobility assessment tomorrow.   4:07 PM, 09/12/16 Etta Grandchild, PT, DPT Physical Therapist - [place] [phone] (Office)    Adelyna Brockman C 09/12/2016, 4:06 PM

## 2016-09-12 NOTE — Clinical Social Work Note (Signed)
Clinical Social Work Assessment  Patient Details  Name: TYREN DUGAR MRN: [id number] Date of Birth: Apr 04, 1956  Date of referral:  09/12/16               Reason for consult:  Facility Placement, Discharge Planning                Permission sought to share information with:  Case Manager, Customer service manager Permission granted to share information::  Yes, Verbal Permission Granted  Name::        Agency::  [facility]  Relationship::     Contact Information:     Housing/Transportation Living arrangements for the past 2 months:  Apartment Source of Information:  Patient, Medical Team, Case Manager Patient Interpreter Needed:  None Criminal Activity/Legal Involvement Pertinent to Current Situation/Hospitalization:  No - Comment as needed Significant Relationships:  Other Family Members Lives with:  Self Do you feel safe going back to the place where you live?  No Need for family participation in patient care:  No (Coment)  Care giving concerns:  Patient seen in room while receiving dialysis.  Patient reports he lives alone, is mostly wheelchair bound, but able to do things independently other than walk.  Reports he feels very weak and is worried about going home as he will be unsafe and unable to care for himself.  Reports he has limited family members, but does have brothers who check on him, but he does most ADLs independently.  Patient requesting referral for SNF/insurance to approve  Social Worker assessment / plan:  LCSW has completed consult and requested PT consult for possible placement consideration.  Patient insurance requires recommendation for SNF/skilled need.  Unclear if patient will be approved due to wheelchair bound status and his report that he is unable to walk.  Will defer to PT.  MD has been paged in effort to see if appropriate for PT consult and next steps.  Will work patient up for SNF if recommended.    Patient reports he does not have medicaid and does  not qualify as he makes a little too much.  He is unable to pay privately at this time.    Employment status:  Disabled (Comment on whether or not currently receiving Disability) Insurance information:  Managed Medicare PT Recommendations:  Not assessed at this time (LCSW requested consult) Information / Referral to community resources:  [place]  Patient/Family's Response to care:  Understanding, but hopeful for placement.  Agreeable to return home if he has too with home health.   Patient/Family's Understanding of and Emotional Response to Diagnosis, Current Treatment, and Prognosis:  Patient aware of his barriers for placement and able to recognize need for assistance and help at home due to his chronic illness.  Emotional Assessment Appearance:  Appears stated age Attitude/Demeanor/Rapport:    Affect (typically observed):  Accepting, Overwhelmed, Pleasant Orientation:  Oriented to Self, Oriented to Place, Oriented to  Time, Oriented to Situation Alcohol / Substance use:  Not Applicable Psych involvement (Current and /or in the community):  No (Comment)  Discharge Needs  Concerns to be addressed:  Home Safety Concerns, Care Coordination Readmission within the last 30 days:  No Current discharge risk:  Chronically ill Barriers to Discharge:  Continued Medical Work up, McDonald, [place], [place] 09/12/2016, 1:12 PM

## 2016-09-12 NOTE — Care Management Note (Signed)
Case Management Note  Patient Details  Name: [name] MRN: [id number] Date of Birth: 1955-06-20   Expected Discharge Date:       09/12/2016           Expected Discharge Plan:  [place]  In-House Referral:  NA  Discharge planning Services  CM Consult  Post Acute Care Choice:  Home Health Choice offered to:  Patient  HH Arranged:  RN [facility] Agency:  [facility]  Status of Service:  Completed, signed off  Additional Comments: Pt ready for DC. He is requesting placement day of DC. PT eval placed for imminent DC but pt is WC bound at baseline and pt unable to pay OOP for placement. Pt okay with DC home with [facility] nursing for hospital follow up and wound care. He has chosen [facility] from list of providers. He understands they have 48hrs to make first visit. Romualdo Bolk, [facility] rep, aware of referral and will obtain pt info from chart.   Sherald Barge, RN 09/12/2016, 3:12 PM

## 2016-09-12 NOTE — Care Management Important Message (Signed)
Important Message  Patient Details  Name: [name] MRN: [id number] Date of Birth: 08-08-1955   Medicare Important Message Given:  Yes    Sherald Barge, RN 09/12/2016, 3:08 PM

## 2016-09-12 NOTE — Progress Notes (Signed)
Pharmacy Antibiotic Note  [name] is a 61 y.o. male admitted on 09/08/2016 with cellulitis.  Pharmacy has been consulted for VANCOMYCIN dosing. Pre dialysis level elevated at 76mcg/ml. Patient is feeling better and improving. MRSA PCR is negative. Blood Cx are ngtd.  Patient will be dialyzed today. MD plans to transition to po antibiotics today. Plan: D/C vancomycin Augmentin 500mg  po daily (after HD) Monitor labs, progress, c/s  Height: 6' (182.9 cm) Weight: 238 lb 11.2 oz (108.3 kg) IBW/kg (Calculated) : 77.6  Temp (24hrs), Avg:99.1 F (37.3 C), Min:98.6 F (37 C), Max:99.8 F (37.7 C)   Recent Labs Lab 09/08/16 1206 09/08/16 1214 09/08/16 1443 09/09/16 0438 09/10/16 0730 09/12/16 0540  WBC 11.9*  --   --  11.5*  --  9.5  CREATININE 3.31*  --   --  3.68* 3.31* 5.13*  LATICACIDVEN  --  1.1 1.2  --   --   --   VANCOTROUGH  --   --   --   --   --  30*    Estimated Creatinine Clearance: 18.7 mL/min (A) (by C-G formula based on SCr of 5.13 mg/dL (H)).    Allergies  Allergen Reactions  . Acetazolamide Other (See Comments)    Jittery odd feeling. (hyper feeling)  . Contrast Media [Iodinated Diagnostic Agents] Hives    In the 80's   . Other Other (See Comments)    Transfer Dye---"Makes me tired"    Antimicrobials this admission: Vancomycin 6/7 >> 6/11  Dose adjustments this admission:  Microbiology results:  6/7 BCx: ngtd  6/7 MRSA PCR: negative  Thank you for allowing pharmacy to be a part of this patient's care.  Isac Sarna, BS Pharm D, [place] Clinical Pharmacist Pager [phone] 09/12/2016 10:42 AM

## 2016-09-12 NOTE — Progress Notes (Signed)
PROGRESS NOTE    [name]  ERD:[id number] DOB: Oct 24, 1955 DOA: 09/08/2016 PCP: [name], MD   [name] a 61 y.o.malewith medical history significant of HTN; HLD; DM; ESRD on MWF HD; PVD s/p B BKA; combined systolic/diastolic chronic heart failure with AICD admitted for buttock abscess. He continues to feel better. He is getting dialysis regularly, and is currently on IV Vancomycin. He had dialysis today, and was going to go home, but he decided that he would like to go to a rehab.  CM was consulted, and PT was consulted, and his discharge was held.   Assessment & Plan:   Principal Problem:   Abscess of buttock, right Active Problems:   Essential hypertension   DM (diabetes mellitus), type 2 with peripheral vascular complications (HCC)   Chronic combined systolic (congestive) and diastolic (congestive) heart failure (HCC)   ESRD (end stage renal disease) (HCC)   S/P bilateral BKA (below knee amputation) ([place])   Anemia due to chronic kidney disease   Abscess  1. Buttock abscess: Lesion is cleaner, less discharge. Base is good. Will continue with IV Lucianne Lei. BC is negative. Plan to discharge home tomorrow and follow up with Dr Arnoldo Morale. I spoke with Dr Arnoldo Morale, who will see him outpatient. Change to Augemntin once daily after dialysis.  Now he wanted to go to rehab.  CM will be working on this.  Full PT evaluation tomorrow.  2. ESRD on HD: Continue with routine HD. Next seesion is due Wed.  3. DM: Continue with SSI. Increase basal to 34 Lantus and meals to 8 TID. Now CBG under controlled.  4. Anemia: Hb of 10 and stable. 5. HTN: Stable. 6. CHF: Compensated.    DVT prophylaxis:Lovenox  Code Status:Full - confirmed with patient/family Family Communication:Brother present throughout evaluation Disposition Plan:Home once clinically improved Consults called:Nephrology for HD tomorrow  Consultants:  Renal.  Procedures: I and D.     Antimicrobials: Anti-infectives    Start     Dose/Rate Route Frequency Ordered Stop   09/12/16 1800  amoxicillin-clavulanate (AUGMENTIN) 500-125 MG per tablet 500 mg     1 tablet Oral Every evening 09/12/16 1100     09/12/16 0000  amoxicillin-clavulanate (AUGMENTIN) 500-125 MG tablet     1 tablet Oral Every evening 09/12/16 1524     09/09/16 0600  vancomycin (VANCOCIN) IVPB 1000 mg/200 mL premix  Status:  Discontinued     1,000 mg 200 mL/hr over 60 Minutes Intravenous Every 24 hours 09/08/16 1705 09/12/16 0802   09/08/16 1430  vancomycin (VANCOCIN) IVPB 1000 mg/200 mL premix     1,000 mg 200 mL/hr over 60 Minutes Intravenous  Once 09/08/16 1417 09/08/16 1603       Subjective:  Want to go to rehab now.   Objective: Vitals:   09/12/16 1345 09/12/16 1400 09/12/16 1430 09/12/16 1440  BP: 131/73 (!) 145/73 (!) 149/77 136/69  Pulse: 64 67 69 70  Resp:      Temp:      TempSrc:      SpO2:      Weight:      Height:        Intake/Output Summary (Last 24 hours) at 09/12/16 1808 Last data filed at 09/12/16 1430  Gross per 24 hour  Intake              240 ml  Output             2675 ml  Net            -  2435 ml   Filed Weights   09/08/16 1457 09/09/16 1855 09/12/16 1020  Weight: 104.3 kg (230 lb) 108.3 kg (238 lb 11.2 oz) 107.9 kg (237 lb 14 oz)    Examination:  General exam: Appears calm and comfortable  Respiratory system: Clear to auscultation. Respiratory effort normal. Cardiovascular system: S1 & S2 heard, RRR. No JVD, murmurs, rubs, gallops or clicks. No pedal edema. Gastrointestinal system: Abdomen is nondistended, soft and nontender. No organomegaly or masses felt. Normal bowel sounds heard. Central nervous system: Alert and oriented. No focal neurological deficits. Extremities: Symmetric 5 x 5 power. S/p BKA bilaterally. Skin: No rashes, lesions or ulcers Psychiatry: Judgement and insight appear normal. Mood & affect appropriate.   Data Reviewed: I have  personally reviewed following labs and imaging studies  CBC:  Recent Labs Lab 09/08/16 1206 09/09/16 0438 09/12/16 0540  WBC 11.9* 11.5* 9.5  NEUTROABS 10.2*  --   --   HGB 10.8* 10.0* 9.0*  HCT 33.7* 30.4* 27.4*  MCV 91.1 90.7 91.0  PLT 201 190 413   Basic Metabolic Panel:  Recent Labs Lab 09/08/16 1206 09/09/16 0438 09/10/16 0730 09/12/16 0540  NA 132* 133* 134* 131*  K 3.6 3.7 3.5 3.5  CL 92* 97* 93* 92*  CO2 28 26 30 28   GLUCOSE 442* 345* 348* 192*  BUN 40* 48* 37* 68*  CREATININE 3.31* 3.68* 3.31* 5.13*  CALCIUM 8.5* 8.2* 8.2* 8.3*  PHOS  --   --   --  3.3   Liver Function Tests:  Recent Labs Lab 09/12/16 0540  ALBUMIN 2.2*   CBG:  Recent Labs Lab 09/11/16 1623 09/11/16 2225 09/12/16 0801 09/12/16 1124 09/12/16 1658  GLUCAP 302* 187* 165* 177* 160*   Sepsis Labs:  Recent Labs Lab 09/08/16 1214 09/08/16 1443  LATICACIDVEN 1.1 1.2    Recent Results (from the past 240 hour(s))  Culture, blood (routine x 2)     Status: None (Preliminary result)   Collection Time: 09/08/16  7:57 PM  Result Value Ref Range Status   Specimen Description RIGHT ANTECUBITAL  Final   Special Requests   Final    BOTTLES DRAWN AEROBIC AND ANAEROBIC Blood Culture adequate volume   Culture NO GROWTH 4 DAYS  Final   Report Status PENDING  Incomplete  Culture, blood (routine x 2)     Status: None (Preliminary result)   Collection Time: 09/08/16  8:34 PM  Result Value Ref Range Status   Specimen Description RIGHT ANTECUBITAL  Final   Special Requests   Final    BOTTLES DRAWN AEROBIC AND ANAEROBIC Blood Culture adequate volume   Culture NO GROWTH 4 DAYS  Final   Report Status PENDING  Incomplete  MRSA PCR Screening     Status: None   Collection Time: 09/08/16  9:00 PM  Result Value Ref Range Status   MRSA by PCR NEGATIVE NEGATIVE Final    Comment:        The GeneXpert MRSA Assay (FDA approved for NASAL specimens only), is one component of a comprehensive MRSA  colonization surveillance program. It is not intended to diagnose MRSA infection nor to guide or monitor treatment for MRSA infections.      Radiology Studies: No results found.  Scheduled Meds: . amoxicillin-clavulanate  1 tablet Oral QPM  . aspirin  81 mg Oral Daily  . brimonidine  1 drop Right Eye Q8H  . calcium acetate  667 mg Oral Q supper  . carvedilol  25 mg Oral BID  WC  . cloNIDine  0.1 mg Oral QHS  . clopidogrel  75 mg Oral QHS  . docusate sodium  100 mg Oral BID  . enoxaparin (LOVENOX) injection  30 mg Subcutaneous Q24H  . epoetin (EPOGEN/PROCRIT) injection  4,000 Units Subcutaneous Q M,W,F-HD  . fenofibrate  160 mg Oral Daily  . folic acid  1 mg Oral Daily  . insulin aspart  0-15 Units Subcutaneous TID WC  . insulin aspart  0-5 Units Subcutaneous QHS  . insulin aspart  8 Units Subcutaneous TID WC  . insulin glargine  34 Units Subcutaneous QHS  . latanoprost  1 drop Right Eye QHS  . ofloxacin  1 drop Right Eye BID  . prednisoLONE acetate  1 drop Right Eye Q2H while awake  . rosuvastatin  20 mg Oral q1800  . timolol  1 drop Right Eye BID   Continuous Infusions: . sodium chloride    . sodium chloride       LOS: 3 days   Aiko Belko, MD FACP Hospitalist.   If 7PM-7AM, please contact night-coverage [email or web address] Password [facility] 09/12/2016, 6:08 PM

## 2016-09-12 NOTE — Progress Notes (Signed)
Subjective: Interval History: Patient feels okay. He denies any nausea or vomiting. Presently he is on dialysis.  Objective: Vital signs in last 24 hours: Temp:  [98.6 F (37 C)-99.8 F (37.7 C)] 99 F (37.2 C) (06/11 1020) Pulse Rate:  [65-80] 73 (06/11 1050) Resp:  [16-19] 16 (06/11 1020) BP: (90-168)/(46-73) 95/49 (06/11 1050) SpO2:  [90 %-97 %] 97 % (06/11 1020) Weight:  [107.9 kg (237 lb 14 oz)] 107.9 kg (237 lb 14 oz) (06/11 1020) Weight change:   Intake/Output from previous day: 06/10 0701 - 06/11 0700 In: 720 [P.O.:720] Out: 875 [Urine:875] Intake/Output this shift: Total I/O In: 240 [P.O.:240] Out: -   General appearance: alert, cooperative and no distress Resp: clear to auscultation bilaterally Cardio: regular rate and rhythm Extremities: edema No edema. Patient status post right BKA and left AKA  Lab Results:  Recent Labs  09/12/16 0540  WBC 9.5  HGB 9.0*  HCT 27.4*  PLT 231   BMET:   Recent Labs  09/10/16 0730 09/12/16 0540  NA 134* 131*  K 3.5 3.5  CL 93* 92*  CO2 30 28  GLUCOSE 348* 192*  BUN 37* 68*  CREATININE 3.31* 5.13*  CALCIUM 8.2* 8.3*   No results for input(s): PTH in the last 72 hours. Iron Studies: No results for input(s): IRON, TIBC, TRANSFERRIN, FERRITIN in the last 72 hours.  Studies/Results: No results found.  I have reviewed the patient's current medications.  Assessment/Plan: Problem #1 end-stage renal disease: Patient is on dialysis. He denies any nausea or vomiting. His potassium is normal. Problem #2 history of abscess of his left buttock: Presently patient on antibiotics. Pain is getting better. He is a febrile. Problem #3 anemia: His hemoglobin has declined slightly. Patient presently on Epogen. Problem #4 metabolic bone disease: Patient is on PhosLo. His calcium and phosphorus is within our target goal.. Problem #5 history of hypertension: His blood pressure is reasonably controlled  Problem #6 diabetes Problem  #7 peripheral vascular disease Plan: We'll continue his dialysis. And we'll try to remove about 2 L if systolic blood pressure remains stable. We'll check his renal panel and CBC in the morning.   LOS: 3 days   Yury Schaus S 09/12/2016,11:08 AM

## 2016-09-13 LAB — GLUCOSE, CAPILLARY
GLUCOSE-CAPILLARY: 189 mg/dL — AB (ref 65–99)
GLUCOSE-CAPILLARY: 206 mg/dL — AB (ref 65–99)
Glucose-Capillary: 181 mg/dL — ABNORMAL HIGH (ref 65–99)
Glucose-Capillary: 206 mg/dL — ABNORMAL HIGH (ref 65–99)

## 2016-09-13 LAB — CULTURE, BLOOD (ROUTINE X 2)
Culture: NO GROWTH
Culture: NO GROWTH
SPECIAL REQUESTS: ADEQUATE
Special Requests: ADEQUATE

## 2016-09-13 NOTE — Clinical Social Work Placement (Addendum)
   CLINICAL SOCIAL WORK PLACEMENT  NOTE  Date:  09/13/2016  Patient Details  Name: [name] MRN: [id number] Date of Birth: 1956-01-01  Clinical Social Work is seeking post-discharge placement for this patient at the [place] level of care (*CSW will initial, date and re-position this form in  chart as items are completed):  Yes   Patient/family provided with [place] Work Department's list of facilities offering this level of care within the geographic area requested by the patient (or if unable, by the patient's family).  Yes   Patient/family informed of their freedom to choose among providers that offer the needed level of care, that participate in Medicare, Medicaid or managed care program needed by the patient, have an available bed and are willing to accept the patient.  Yes   Patient/family informed of [place]'s ownership interest in [facility] and [facility], as well as of the fact that they are under no obligation to receive care at these facilities.  PASRR submitted to EDS on       PASRR number received on       Existing PASRR number confirmed on 09/13/16     FL2 transmitted to all facilities in geographic area requested by pt/family on 09/13/16     FL2 transmitted to all facilities within larger geographic area on       Patient informed that his/her managed care company has contracts with or will negotiate with certain facilities, including the following:            Patient/family informed of bed offers received.  09/14/2016   Patient chooses bed at       [facility]  Physician recommends and patient chooses bed at      [facility] Patient to be transferred to   on  .  09/14/2016   Patient to be transferred to facility by     [facility] staff  Patient family notified on   of transfer.  None, patient will update family per his wishes  Name of family member notified:        PHYSICIAN Please sign FL2      Additional Comment:    _______________________________________________ Lilly Cove, LCSW 09/13/2016, 9:16 AM

## 2016-09-13 NOTE — Progress Notes (Addendum)
LCSW has submitted for insurance authorization  11:30am. Awaiting review.  Patient is wanting [facility]. [facility] with [facility]. Patient uses [facility] for transport and he plans to continue using [facility] when at facility for dialysis. [facility] updated  Regarding plan of care and barriers. If insurance Josem Kaufmann is obtained before 4:30pm patient can transfer.  Insurance continues to be be the barrier.  Will continue to follow and assist with DC planning.  Lane Hacker, MSW Clinical Social Work: [employer] Coverage for :  [phone]

## 2016-09-13 NOTE — Evaluation (Addendum)
Physical Therapy Evaluation Patient Details Name: [name] MRN: [id number] DOB: [birth year] Today's Date: 09/13/2016   History of Present Illness  [name] is a 61yo black male who comes [facility] on 6/[facility] with severe pain in buttocks, found to found an indurated abscess. Pt is now s/p drainage of abscess, with wound dressed, and remains somewhat weak and deconditioned. PMH: HTN, HLD, DM, ESRD on HD MWF, PVD s/p Bilat BKA, SHF, DHF, AICD, anemia, and psoraisis (without joint symptoms per patient). 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 Pt reports that his limb swelling has been well managed since he began dialysis in March 2018. He was using his prostheses for transfers up until 1YA, and in the last year has perfomred all of this transfers from bed<>WC<>toilet<> independently. He regularly accesses the community, independnent in self propelled manual WC. He lives alone in a 3rd floor apartment, elevator to access, PLOF include total independence in ADL/IADL, gets rides frm friend/family to community, and transportation to HD. Pt reports that he would like to return to AMB now that his limb edema is under control, but that he ihas been very weak the last few days.     Clinical Impression  Pt admitted with above diagnosis. Pt currently with functional limitations due to the deficits listed below (see "PT Problem List"). Upon entry, the patient is received semirecumbent up in chair on lift sling. The pt is awake and agreeable to participate. No acute distress noted at this time, pt only reporting some mild pain in his Rt buttock wound. VSS during eval. Pt reports he needs to have a bowel movement and would like to use BSC. The pt performs multiple transfers anterior/posterior scoot transfers chair to bed to [facility], with supervision  for set-up and safety, minA for removal of wound dressing which is partially occluding the projected pathway of impending stool, and MinA to steady BSC as he transfers onto it. Functional mobility assessment demonstrates moderate weakness requiring rest breaks intermittently d/t fatigue and pain, as the patient performs these independently at baseline without fatigue. The wound site appears clean, almond shaped and fairly open, not measured (not the reason for this visit) but estimated to be 3-4cm long, <1cm wide, and 1.5-2.5cm deep, without any apparent drainage. PT expresses concerns to RN regarding the patient's ability to keep the wound clean during defecation and clean-up, as well as careful monitoring and dressing of this wound going forward s/p DC. Pt is clearly deconditioned for his baseline, likely from immobility since admission, as he is typically very physically active, but has largely been bed bound since arrival. Pt will benefit from skilled PT intervention to increase independence and safety with basic mobility in preparation for discharge to the venue listed below.       Follow Up Recommendations SNF;Other (comment) (Good rehab potential, very high PLOF; concerned about patient's ability to monitor and care for his wound. )    Equipment Recommendations  None recommended by PT    Recommendations for Other Services       Precautions / Restrictions Precautions Precautions: Fall Precaution Comments: bilat BKA, WC for mobility at baseline, indep c transfers      Mobility  Bed Mobility Overal bed mobility: Independent  Transfers Overall transfer level: Needs assistance Equipment used: None Transfers: Lateral/Scoot Transfers;Anterior-Posterior Wellsite geologist transfers: Supervision;Min assist   General transfer comment: Fwd scoot xfer geri chair to bed; post scoot transfer from longsitting in bed to [facility] req minA to steady BSC. Additional  time needed due to pain and deconditioning related fatigue   Ambulation/Gait Ambulation/Gait assistance:  (unable to AMB recently due to LLE limb edema. )              Stairs            Wheelchair Mobility    Modified Rankin (Stroke Patients Only)       Balance Overall balance assessment: Modified Independent;No apparent balance deficits (not formally assessed)                                           Pertinent Vitals/Pain Pain Assessment: 0-10 Pain Score: 2  Pain Location: Right buttock wound  Pain Intervention(s): Monitored during session;Repositioned    Home Living Family/patient expects to be discharged to:: Private residence Living Arrangements: Alone Available Help at Discharge: Available PRN/intermittently;Friend(s) Type of Home: Apartment Home Access: Elevator     Home Layout: One level (lives on third floor) Home Equipment: Walker - 2 wheels;Tub bench;Wheelchair - Education officer, community - power;Other (comment) (bilat BKA prostheses) Additional Comments: bil prostheses made by [employer], not worn in >1 yr secondary to leg swelling.    Prior Function Level of Independence: Independent with assistive device(s)   Gait / Transfers Assistance Needed: transfering with prosthesis up until 1YA, AMB with single RLE prosthesis up until 2013  ADL's / Homemaking Assistance Needed: fully independent  Comments: scoot transfers independently without device, no slide boards      Hand Dominance        Extremity/Trunk Assessment   Upper Extremity Assessment Upper Extremity Assessment: Generalized weakness;Overall [facility] for tasks assessed    Lower Extremity Assessment Lower Extremity Assessment: Generalized weakness;Overall [facility] for tasks assessed    Cervical / Trunk Assessment Cervical / Trunk Assessment: Normal  Communication   Communication: No difficulties  Cognition Arousal/Alertness: Awake/alert Behavior During Therapy: WFL for tasks  assessed/performed Overall Cognitive Status: Within Functional Limits for tasks assessed                                        General Comments      Exercises     Assessment/Plan    PT Assessment Patient needs continued PT services  PT Problem List Decreased strength;Obesity;Decreased activity tolerance;Decreased mobility;Pain;Decreased skin integrity       PT Treatment Interventions Functional mobility training;Therapeutic activities;Therapeutic exercise;Patient/family education;Wheelchair mobility training    PT Goals (Current goals can be found in the Care Plan section)  Acute Rehab PT Goals Patient Stated Goal: Return to stand-pivot transfers with bilat BKA prostheses; regain strength in BUE for indep mobility  PT Goal Formulation: With patient Time For Goal Achievement: 09/27/16 Potential to Achieve Goals: Good    Frequency Min 2X/week   Barriers to discharge Decreased caregiver support      Co-evaluation               AM-PAC PT "6 Clicks" Daily Activity  Outcome Measure Difficulty turning over in bed (including adjusting bedclothes, sheets and blankets)?: None Difficulty moving  from lying on back to sitting on the side of the bed? : None Difficulty sitting down on and standing up from a chair with arms (e.g., wheelchair, bedside commode, etc,.)?: Total Help needed moving to and from a bed to chair (including a wheelchair)?: A Lot Help needed walking in hospital room?: Total Help needed climbing 3-5 steps with a railing? : Total 6 Click Score: 13    End of Session   Activity Tolerance: Patient tolerated treatment well;Patient limited by fatigue;Patient limited by pain Patient left: in bed;with nursing/sitter in room;with call bell/phone within reach (awaiting new woudn dressing in Rt sidelying) Nurse Communication: Mobility status;Other (comment) (concern for self care and monitoring of wound. ) PT Visit Diagnosis: Muscle weakness  (generalized) (M62.81);Other abnormalities of gait and mobility (R26.89)    Time: 0935-1010 PT Time Calculation (min) (ACUTE ONLY): 35 min   Charges:   PT Evaluation $PT Eval Moderate Complexity: 1 Procedure PT Treatments $Therapeutic Activity: 8-22 mins   PT G Codes:        11:44 AM, 2016-09-14 Etta Grandchild, PT, DPT Physical Therapist - [place] [phone] (Office)    Coren Crownover C 2016-09-14, 11:34 AM

## 2016-09-13 NOTE — Progress Notes (Signed)
PROGRESS NOTE    VRISHANK MOSTER  GDJ:[id number] DOB: [birth year] DOA: 09/08/2016 PCP: [name], MD    Brief Narrative:  Lily Kocher Nobleis a 61 y.o.malewith medical history significant of HTN; HLD; DM; ESRD on MWF HD; PVD s/p B BKA; combined systolic/diastolic chronic heart failure with AICD admitted for buttock abscess. He continues to feel better. He is getting dialysis regularly, and is currently on IV Vancomycin. He had dialysis today, and was going to go home, but he decided that he would like to go to a rehab.  CM was consulted, and PT was consulted, and his discharge was held. No new event.   Assessment & Plan:   Principal Problem:   Abscess of buttock, right Active Problems:   Essential hypertension   DM (diabetes mellitus), type 2 with peripheral vascular complications (HCC)   Chronic combined systolic (congestive) and diastolic (congestive) heart failure (HCC)   ESRD (end stage renal disease) (HCC)   S/P bilateral BKA (below knee amputation) ([place])   Anemia due to chronic kidney disease   Abscess  1. Buttock abscess: Lesion is cleaner, less discharge. Base is good. Have changed to oral Augmentin.  BC is negative. Will do wet to dry dressing.  He will need follow up with Dr Arnoldo Morale. I spoke with Dr Arnoldo Morale, who will see him outpatient. Now he wanted to go to rehab.  CM will be working on this.   2. ESRD on HD: Continue with routine HD. MWF scheduled.  3. DM: Continue with SSI. Increase basal to 34Lantus and meals to 8TID. Now CBG under controlled.  4. Anemia: Hb of 10 and stable. 5. HTN: Stable. 6. CHF: Compensated.    DVT prophylaxis:Lovenox  Code Status:Full - confirmed with patient/family Family Communication:Brother present throughout evaluation Disposition Plan:Home once clinically improved Consults called:Nephrology for HD tomorrow  Consultants:  Renal.  Procedures: I and D.    Antimicrobials: Anti-infectives    Start      Dose/Rate Route Frequency Ordered Stop   09/12/16 1800  amoxicillin-clavulanate (AUGMENTIN) 500-125 MG per tablet 500 mg     1 tablet Oral Every evening 09/12/16 1100     09/12/16 0000  amoxicillin-clavulanate (AUGMENTIN) 500-125 MG tablet     1 tablet Oral Every evening 09/12/16 1524     09/09/16 0600  vancomycin (VANCOCIN) IVPB 1000 mg/200 mL premix  Status:  Discontinued     1,000 mg 200 mL/hr over 60 Minutes Intravenous Every 24 hours 09/08/16 1705 09/12/16 0802   09/08/16 1430  vancomycin (VANCOCIN) IVPB 1000 mg/200 mL premix     1,000 mg 200 mL/hr over 60 Minutes Intravenous  Once 09/08/16 1417 09/08/16 1603       Subjective:  No complaints.   Objective: Vitals:   09/12/16 2118 09/13/16 0611 09/13/16 1000 09/13/16 1413  BP: (!) 120/53 140/67  (!) 151/58  Pulse: 76 77 (!) 58 84  Resp: 18 18  18   Temp: 98.1 F (36.7 C) 98 F (36.7 C)  (!) 101.1 F (38.4 C)  TempSrc: Oral Oral  Oral  SpO2: 94% 95% 96% 93%  Weight:      Height:        Intake/Output Summary (Last 24 hours) at 09/13/16 1633 Last data filed at 09/13/16 0900  Gross per 24 hour  Intake              480 ml  Output              400 ml  Net  80 ml   Filed Weights   09/08/16 1457 09/09/16 1855 09/12/16 1020  Weight: 104.3 kg (230 lb) 108.3 kg (238 lb 11.2 oz) 107.9 kg (237 lb 14 oz)    Examination:  General exam: Appears calm and comfortable  Respiratory system: Clear to auscultation. Respiratory effort normal. Cardiovascular system: S1 & S2 heard, RRR. No JVD, murmurs, rubs, gallops or clicks. No pedal edema. Gastrointestinal system: Abdomen is nondistended, soft and nontender. No organomegaly or masses felt. Normal bowel sounds heard. Central nervous system: Alert and oriented. No focal neurological deficits. Extremities: Symmetric 5 x 5 power. Skin: I saw wound again today.  Still look clean.   Psychiatry: Judgement and insight appear normal. Mood & affect appropriate.   Data Reviewed:  I have personally reviewed following labs and imaging studies  CBC:  Recent Labs Lab 09/08/16 1206 09/09/16 0438 09/12/16 0540  WBC 11.9* 11.5* 9.5  NEUTROABS 10.2*  --   --   HGB 10.8* 10.0* 9.0*  HCT 33.7* 30.4* 27.4*  MCV 91.1 90.7 91.0  PLT 201 190 683   Basic Metabolic Panel:  Recent Labs Lab 09/08/16 1206 09/09/16 0438 09/10/16 0730 09/12/16 0540  NA 132* 133* 134* 131*  K 3.6 3.7 3.5 3.5  CL 92* 97* 93* 92*  CO2 28 26 30 28   GLUCOSE 442* 345* 348* 192*  BUN 40* 48* 37* 68*  CREATININE 3.31* 3.68* 3.31* 5.13*  CALCIUM 8.5* 8.2* 8.2* 8.3*  PHOS  --   --   --  3.3   GFR: Estimated Creatinine Clearance: 18.7 mL/min (A) (by C-G formula based on SCr of 5.13 mg/dL (H)). Liver Function Tests:  Recent Labs Lab 09/12/16 0540  ALBUMIN 2.2*   CBG:  Recent Labs Lab 09/12/16 1658 09/12/16 2116 09/13/16 0744 09/13/16 1113 09/13/16 1623  GLUCAP 160* 223* 181* 189* 206*   Sepsis Labs:  Recent Labs Lab 09/08/16 1214 09/08/16 1443  LATICACIDVEN 1.1 1.2    Recent Results (from the past 240 hour(s))  Culture, blood (routine x 2)     Status: None   Collection Time: 09/08/16  7:57 PM  Result Value Ref Range Status   Specimen Description RIGHT ANTECUBITAL  Final   Special Requests   Final    BOTTLES DRAWN AEROBIC AND ANAEROBIC Blood Culture adequate volume   Culture NO GROWTH 5 DAYS  Final   Report Status 09/13/2016 FINAL  Final  Culture, blood (routine x 2)     Status: None   Collection Time: 09/08/16  8:34 PM  Result Value Ref Range Status   Specimen Description RIGHT ANTECUBITAL  Final   Special Requests   Final    BOTTLES DRAWN AEROBIC AND ANAEROBIC Blood Culture adequate volume   Culture NO GROWTH 5 DAYS  Final   Report Status 09/13/2016 FINAL  Final  MRSA PCR Screening     Status: None   Collection Time: 09/08/16  9:00 PM  Result Value Ref Range Status   MRSA by PCR NEGATIVE NEGATIVE Final    Comment:        The GeneXpert MRSA Assay  (FDA approved for NASAL specimens only), is one component of a comprehensive MRSA colonization surveillance program. It is not intended to diagnose MRSA infection nor to guide or monitor treatment for MRSA infections.      Radiology Studies: No results found.  Scheduled Meds: . amoxicillin-clavulanate  1 tablet Oral QPM  . aspirin  81 mg Oral Daily  . brimonidine  1 drop Right Eye Q8H  .  calcium acetate  667 mg Oral Q supper  . carvedilol  25 mg Oral BID WC  . cloNIDine  0.1 mg Oral QHS  . clopidogrel  75 mg Oral QHS  . docusate sodium  100 mg Oral BID  . enoxaparin (LOVENOX) injection  30 mg Subcutaneous Q24H  . epoetin (EPOGEN/PROCRIT) injection  4,000 Units Subcutaneous Q M,W,F-HD  . fenofibrate  160 mg Oral Daily  . folic acid  1 mg Oral Daily  . insulin aspart  0-15 Units Subcutaneous TID WC  . insulin aspart  0-5 Units Subcutaneous QHS  . insulin aspart  8 Units Subcutaneous TID WC  . insulin glargine  34 Units Subcutaneous QHS  . latanoprost  1 drop Right Eye QHS  . ofloxacin  1 drop Right Eye BID  . prednisoLONE acetate  1 drop Right Eye Q2H while awake  . rosuvastatin  20 mg Oral q1800  . timolol  1 drop Right Eye BID   Continuous Infusions: . sodium chloride    . sodium chloride       LOS: 4 days   Sheera Illingworth, MD FACP Hospitalist.   If 7PM-7AM, please contact night-coverage [email or web address] Password [facility] 09/13/2016, 4:33 PM

## 2016-09-13 NOTE — NC FL2 (Signed)
[place] LEVEL OF CARE SCREENING TOOL     IDENTIFICATION  Patient Name: [name] Birthdate: 03/05/1956 Sex: male Admission Date (Current Location): 09/08/2016  [facility] and [place] Number:  [employer] and Address:  [place] [address], [place]      Provider Number: [id number]  Attending Physician Name and Address:  Orvan Falconer, MD  Relative Name and Phone Number:       Current Level of Care: Hospital Recommended Level of Care: [place] Prior Approval Number:    Date Approved/Denied:   PASRR Number:   [id number]  Discharge Plan: SNF    Current Diagnoses: Patient Active Problem List   Diagnosis Date Noted  . Abscess 09/09/2016  . Abscess of buttock, right 09/08/2016  . ESRD (end stage renal disease) ([place]) 09/08/2016  . S/P bilateral BKA (below knee amputation) ([place]) 09/08/2016  . Anemia due to chronic kidney disease 09/08/2016  . Chronic combined systolic (congestive) and diastolic (congestive) heart failure ([place]) 06/20/2016  . Acute hypoxemic respiratory failure ([place]) 06/19/2016  . Volume overload 06/19/2016  . AKI (acute kidney injury) ([place]) 06/19/2016  . DM (diabetes mellitus), type 2 with peripheral vascular complications ([place]) 69/67/8938  . SOB (shortness of breath)   . ACHILLES BURSITIS OR TENDINITIS 09/14/2009  . PLANTAR FACIITIS 09/14/2009  . TIA 09/07/2009  . Peripheral vascular disease ([place]) 01/07/2009  . HYPERLIPIDEMIA 08/27/2008  . Essential hypertension 08/27/2008    Orientation RESPIRATION BLADDER Height & Weight     Self, Time, Situation, Place  Normal Continent Weight: 237 lb 14 oz (107.9 kg) Height:  6' (182.9 cm)  BEHAVIORAL SYMPTOMS/MOOD NEUROLOGICAL BOWEL NUTRITION STATUS      Continent Diet (low sodium heart healthy)  AMBULATORY STATUS COMMUNICATION OF NEEDS Skin   Extensive Assist (wheelchair use at home) Verbally Surgical wounds, Skin abrasions, Bruising, PU  Stage and Appropriate Care (buttocks, clean to dry twice daily)                       Personal Care Assistance Level of Assistance  Bathing, Feeding, Dressing Bathing Assistance: Limited assistance Feeding assistance: Independent Dressing Assistance: Limited assistance     Functional Limitations Info  Sight, Hearing, Speech Sight Info: Adequate Hearing Info: Adequate Speech Info: Adequate    SPECIAL CARE FACTORS FREQUENCY  PT (By licensed PT), OT (By licensed OT)     PT Frequency: 5x OT Frequency: 5x            Contractures Contractures Info: Not present    Additional Factors Info  Code Status, Allergies Code Status Info: Full Code  Allergies Info: Acetazolamide, Contrast Media Iodinated Diagnostic Agents, Other    Dialysis:  MWF       Current Medications (09/13/2016):  This is the current hospital active medication list Current Facility-Administered Medications  Medication Dose Route Frequency Provider Last Rate Last Dose  . 0.9 %  sodium chloride infusion  100 mL Intravenous PRN Bhutani, Manpreet S, MD      . 0.9 %  sodium chloride infusion  100 mL Intravenous PRN Bhutani, Manpreet S, MD      . acetaminophen (TYLENOL) tablet 650 mg  650 mg Oral Q6H PRN Karmen Bongo, MD   650 mg at 09/12/16 1924   Or  . acetaminophen (TYLENOL) suppository 650 mg  650 mg Rectal Q6H PRN Karmen Bongo, MD      . amoxicillin-clavulanate (AUGMENTIN) 500-125 MG per tablet 500 mg  1 tablet Oral QPM Orvan Falconer, MD   500 mg at 09/12/16 1737  . aspirin chewable tablet 81 mg  81 mg Oral Daily Karmen Bongo, MD   81 mg at 09/13/16 0817  . brimonidine (ALPHAGAN) 0.2 % ophthalmic solution 1 drop  1 drop Right Eye Q8H Karmen Bongo, MD   1 drop at 09/13/16 0600  . calcium acetate (PHOSLO) capsule 667 mg  667 mg Oral Q supper Karmen Bongo, MD   667 mg at 09/12/16 1737  . calcium carbonate (dosed in mg elemental calcium) suspension 500 mg of elemental calcium  500 mg of elemental  calcium Oral Q6H PRN Karmen Bongo, MD      . camphor-menthol [facility]) lotion 1 application  1 application Topical W6O PRN Karmen Bongo, MD       And  . hydrOXYzine (ATARAX/VISTARIL) tablet 25 mg  25 mg Oral Q8H PRN Karmen Bongo, MD   25 mg at 09/11/16 2226  . carvedilol (COREG) tablet 25 mg  25 mg Oral BID WC Karmen Bongo, MD   25 mg at 09/13/16 0752  . cloNIDine (CATAPRES) tablet 0.1 mg  0.1 mg Oral QHS Karmen Bongo, MD   0.1 mg at 09/12/16 2117  . clopidogrel (PLAVIX) tablet 75 mg  75 mg Oral Ivery Quale, MD   75 mg at 09/12/16 2117  . docusate sodium (COLACE) capsule 100 mg  100 mg Oral BID Karmen Bongo, MD   100 mg at 09/13/16 0817  . docusate sodium (ENEMEEZ) enema 283 mg  1 enema Rectal PRN Karmen Bongo, MD      . enoxaparin (LOVENOX) injection 30 mg  30 mg Subcutaneous Q24H Karmen Bongo, MD   30 mg at 09/12/16 2118  . epoetin alfa (EPOGEN,PROCRIT) injection 4,000 Units  4,000 Units Subcutaneous Q M,W,F-HD Liana Gerold, MD   4,000 Units at 09/12/16 1102  . feeding supplement (NEPRO CARB STEADY) liquid 237 mL  237 mL Oral TID PRN Karmen Bongo, MD      . fenofibrate tablet 160 mg  160 mg Oral Daily Karmen Bongo, MD   160 mg at 09/13/16 0817  . fentaNYL (SUBLIMAZE) injection 12.5 mcg  12.5 mcg Intravenous Q2H PRN Karmen Bongo, MD   12.5 mcg at 09/13/16 0752  . folic acid (FOLVITE) tablet 1 mg  1 mg Oral Daily Karmen Bongo, MD   1 mg at 09/13/16 0817  . heparin injection 2,100 Units  20 Units/kg Dialysis PRN Liana Gerold, MD   2,100 Units at 09/12/16 1025  . hydrALAZINE (APRESOLINE) injection 5 mg  5 mg Intravenous Q4H PRN Karmen Bongo, MD      . insulin aspart (novoLOG) injection 0-15 Units  0-15 Units Subcutaneous TID WC Karmen Bongo, MD   3 Units at 09/13/16 0757  . insulin aspart (novoLOG) injection 0-5 Units  0-5 Units Subcutaneous QHS Karmen Bongo, MD   2 Units at 09/12/16 2118  . insulin aspart (novoLOG) injection 8 Units   8 Units Subcutaneous TID WC Orvan Falconer, MD   8 Units at 09/13/16 0756  . insulin glargine (LANTUS) injection 34 Units  34 Units Subcutaneous QHS Orvan Falconer, MD   34 Units at 09/12/16 2118  . latanoprost (XALATAN) 0.005 % ophthalmic solution 1 drop  1 drop Right Eye Ivery Quale, MD   1 drop at 09/12/16 2123  . lidocaine (PF) (XYLOCAINE) 1 % injection 5 mL  5 mL Intradermal PRN Bhutani, Manpreet S, MD      . lidocaine-prilocaine (  EMLA) cream 1 application  1 application Topical PRN Bhutani, Manpreet S, MD      . ofloxacin (OCUFLOX) 0.3 % ophthalmic solution 1 drop  1 drop Right Eye BID Karmen Bongo, MD   1 drop at 09/13/16 0818  . ondansetron (ZOFRAN) tablet 4 mg  4 mg Oral Q6H PRN Karmen Bongo, MD       Or  . ondansetron [facility]) injection 4 mg  4 mg Intravenous Q6H PRN Karmen Bongo, MD      . oxyCODONE (Oxy IR/ROXICODONE) immediate release tablet 5 mg  5 mg Oral QHS PRN Karmen Bongo, MD   5 mg at 09/11/16 2226  . pentafluoroprop-tetrafluoroeth (GEBAUERS) aerosol 1 application  1 application Topical PRN Bhutani, Manpreet S, MD      . prednisoLONE acetate (PRED FORTE) 1 % ophthalmic suspension 1 drop  1 drop Right Eye Q2H while awake Karmen Bongo, MD   1 drop at 09/13/16 0818  . rosuvastatin (CRESTOR) tablet 20 mg  20 mg Oral q1800 Karmen Bongo, MD   20 mg at 09/12/16 1737  . sorbitol 70 % solution 30 mL  30 mL Oral PRN Karmen Bongo, MD   30 mL at 09/12/16 0824  . timolol (TIMOPTIC) 0.5 % ophthalmic solution 1 drop  1 drop Right Eye BID Karmen Bongo, MD   1 drop at 09/13/16 0818  . zolpidem (AMBIEN) tablet 5 mg  5 mg Oral QHS PRN Karmen Bongo, MD   5 mg at 09/11/16 2226     Discharge Medications: Please see discharge summary for a list of discharge medications.  Relevant Imaging Results:  Relevant Lab Results:   Additional Information SSN:  [id number]     ESRD on MWF HD;  [place], [place]

## 2016-09-13 NOTE — Progress Notes (Signed)
Subjective: Interval History: Patient complains of pain his legs bilaterally which he has on and off for a few years.  Objective: Vital signs in last 24 hours: Temp:  [98 F (36.7 C)-98.1 F (36.7 C)] 98 F (36.7 C) (06/12 0611) Pulse Rate:  [63-77] 77 (06/12 0611) Resp:  [18] 18 (06/12 0611) BP: (90-157)/(46-80) 140/67 (06/12 0611) SpO2:  [94 %-95 %] 95 % (06/12 0611) Weight change:   Intake/Output from previous day: 06/11 0701 - 06/12 0700 In: 720 [P.O.:720] Out: 2300 [Urine:400] Intake/Output this shift: Total I/O In: 240 [P.O.:240] Out: -   General appearance: alert, cooperative and no distress Resp: clear to auscultation bilaterally Cardio: regular rate and rhythm Extremities: edema No edema. Patient status post right BKA and left AKA  Lab Results:  Recent Labs  09/12/16 0540  WBC 9.5  HGB 9.0*  HCT 27.4*  PLT 231   BMET:   Recent Labs  09/12/16 0540  NA 131*  K 3.5  CL 92*  CO2 28  GLUCOSE 192*  BUN 68*  CREATININE 5.13*  CALCIUM 8.3*   No results for input(s): PTH in the last 72 hours. Iron Studies: No results for input(s): IRON, TIBC, TRANSFERRIN, FERRITIN in the last 72 hours.  Studies/Results: No results found.  I have reviewed the patient's current medications.  Assessment/Plan: Problem #1 end-stage renal disease: Patient Status post hemodialysis yesterday. Presently he doesn't have any uremic sinus symptoms. Problem #2 history of abscess of his left buttock: He denies any pain and feeling much [name]. Problem #3 anemia: His hemoglobin has declined slightly. Patient presently on Epogen. Problem #4 metabolic bone disease: Patient is on PhosLo. His calcium and phosphorus is within our target goal.. Problem #5 history of hypertension: His blood pressure is reasonably controlled  Problem #6 diabetes Problem #7 peripheral vascular disease: Status post bilateral BKA. Problem #8 fluid management: Patient doesn't have any sign of fluid overload.  We're able to pool about 1.9 L yesterday. Plan: Patient doesn't require dialysis today. His dialysis will be tomorrow We'll check his renal panel and CBC in the morning.   LOS: 4 days   [name] S 09/13/2016,10:21 AM

## 2016-09-14 ENCOUNTER — Inpatient Hospital Stay
Admission: RE | Admit: 2016-09-14 | Discharge: 2016-09-26 | Disposition: A | Discharge status: Nursing Home (Includes ICF) | Payer: Medicare Other | Source: Ambulatory Visit | Attending: Internal Medicine | Admitting: Internal Medicine

## 2016-09-14 DIAGNOSIS — N189 Chronic kidney disease, unspecified: Secondary | ICD-10-CM

## 2016-09-14 DIAGNOSIS — I10 Essential (primary) hypertension: Secondary | ICD-10-CM | POA: Diagnosis not present

## 2016-09-14 DIAGNOSIS — Z89512 Acquired absence of left leg below knee: Secondary | ICD-10-CM

## 2016-09-14 DIAGNOSIS — L0231 Cutaneous abscess of buttock: Secondary | ICD-10-CM | POA: Diagnosis not present

## 2016-09-14 DIAGNOSIS — N2581 Secondary hyperparathyroidism of renal origin: Secondary | ICD-10-CM | POA: Diagnosis not present

## 2016-09-14 DIAGNOSIS — I5042 Chronic combined systolic (congestive) and diastolic (congestive) heart failure: Secondary | ICD-10-CM | POA: Diagnosis not present

## 2016-09-14 DIAGNOSIS — E785 Hyperlipidemia, unspecified: Secondary | ICD-10-CM | POA: Diagnosis not present

## 2016-09-14 DIAGNOSIS — N186 End stage renal disease: Secondary | ICD-10-CM | POA: Diagnosis not present

## 2016-09-14 DIAGNOSIS — E1151 Type 2 diabetes mellitus with diabetic peripheral angiopathy without gangrene: Secondary | ICD-10-CM | POA: Diagnosis not present

## 2016-09-14 DIAGNOSIS — I132 Hypertensive heart and chronic kidney disease with heart failure and with stage 5 chronic kidney disease, or end stage renal disease: Secondary | ICD-10-CM | POA: Diagnosis not present

## 2016-09-14 DIAGNOSIS — M6281 Muscle weakness (generalized): Secondary | ICD-10-CM | POA: Diagnosis not present

## 2016-09-14 DIAGNOSIS — I428 Other cardiomyopathies: Secondary | ICD-10-CM | POA: Diagnosis not present

## 2016-09-14 DIAGNOSIS — Z79899 Other long term (current) drug therapy: Secondary | ICD-10-CM | POA: Diagnosis not present

## 2016-09-14 DIAGNOSIS — Z89511 Acquired absence of right leg below knee: Secondary | ICD-10-CM | POA: Diagnosis not present

## 2016-09-14 DIAGNOSIS — Z992 Dependence on renal dialysis: Secondary | ICD-10-CM | POA: Diagnosis not present

## 2016-09-14 DIAGNOSIS — Z87891 Personal history of nicotine dependence: Secondary | ICD-10-CM | POA: Diagnosis not present

## 2016-09-14 DIAGNOSIS — D631 Anemia in chronic kidney disease: Secondary | ICD-10-CM | POA: Diagnosis not present

## 2016-09-14 DIAGNOSIS — Z7902 Long term (current) use of antithrombotics/antiplatelets: Secondary | ICD-10-CM | POA: Diagnosis not present

## 2016-09-14 DIAGNOSIS — I739 Peripheral vascular disease, unspecified: Secondary | ICD-10-CM | POA: Diagnosis not present

## 2016-09-14 DIAGNOSIS — L0291 Cutaneous abscess, unspecified: Secondary | ICD-10-CM | POA: Diagnosis not present

## 2016-09-14 DIAGNOSIS — L02215 Cutaneous abscess of perineum: Secondary | ICD-10-CM | POA: Diagnosis not present

## 2016-09-14 DIAGNOSIS — Z23 Encounter for immunization: Secondary | ICD-10-CM | POA: Diagnosis not present

## 2016-09-14 DIAGNOSIS — I5043 Acute on chronic combined systolic (congestive) and diastolic (congestive) heart failure: Secondary | ICD-10-CM | POA: Diagnosis not present

## 2016-09-14 DIAGNOSIS — I679 Cerebrovascular disease, unspecified: Secondary | ICD-10-CM | POA: Diagnosis not present

## 2016-09-14 DIAGNOSIS — Z89519 Acquired absence of unspecified leg below knee: Secondary | ICD-10-CM | POA: Diagnosis not present

## 2016-09-14 DIAGNOSIS — Z7982 Long term (current) use of aspirin: Secondary | ICD-10-CM | POA: Diagnosis not present

## 2016-09-14 DIAGNOSIS — E1159 Type 2 diabetes mellitus with other circulatory complications: Secondary | ICD-10-CM | POA: Diagnosis not present

## 2016-09-14 DIAGNOSIS — D509 Iron deficiency anemia, unspecified: Secondary | ICD-10-CM | POA: Diagnosis not present

## 2016-09-14 LAB — CBC
HEMATOCRIT: 26.8 % — AB (ref 39.0–52.0)
Hemoglobin: 8.7 g/dL — ABNORMAL LOW (ref 13.0–17.0)
MCH: 29.9 pg (ref 26.0–34.0)
MCHC: 32.5 g/dL (ref 30.0–36.0)
MCV: 92.1 fL (ref 78.0–100.0)
PLATELETS: 227 10*3/uL (ref 150–400)
RBC: 2.91 MIL/uL — AB (ref 4.22–5.81)
RDW: 12.7 % (ref 11.5–15.5)
WBC: 9.5 10*3/uL (ref 4.0–10.5)

## 2016-09-14 LAB — RENAL FUNCTION PANEL
Albumin: 2.1 g/dL — ABNORMAL LOW (ref 3.5–5.0)
Anion gap: 9 (ref 5–15)
BUN: 51 mg/dL — ABNORMAL HIGH (ref 6–20)
CHLORIDE: 96 mmol/L — AB (ref 101–111)
CO2: 30 mmol/L (ref 22–32)
CREATININE: 4.2 mg/dL — AB (ref 0.61–1.24)
Calcium: 8 mg/dL — ABNORMAL LOW (ref 8.9–10.3)
GFR calc non Af Amer: 14 mL/min — ABNORMAL LOW (ref >60)
GFR, EST AFRICAN AMERICAN: 16 mL/min — AB (ref >60)
Glucose, Bld: 173 mg/dL — ABNORMAL HIGH (ref 65–99)
Phosphorus: 3 mg/dL (ref 2.5–4.6)
Potassium: 3.6 mmol/L (ref 3.5–5.1)
Sodium: 135 mmol/L (ref 135–145)

## 2016-09-14 LAB — GLUCOSE, CAPILLARY
GLUCOSE-CAPILLARY: 154 mg/dL — AB (ref 65–99)
Glucose-Capillary: 143 mg/dL — ABNORMAL HIGH (ref 65–99)

## 2016-09-14 MED ORDER — OXYCODONE HCL 5 MG PO TABS
5.0000 mg | ORAL_TABLET | Freq: Four times a day (QID) | ORAL | Status: DC | PRN
  Administered 2016-09-14: 5 mg via ORAL
  Filled 2016-09-14: qty 1

## 2016-09-14 MED ORDER — HEPARIN SODIUM (PORCINE) 1000 UNIT/ML IJ SOLN
INTRAMUSCULAR | Status: AC
  Administered 2016-09-14: 2100 [IU] via INTRAVENOUS_CENTRAL
  Filled 2016-09-14: qty 3

## 2016-09-14 MED ORDER — DOXYCYCLINE HYCLATE 100 MG PO CAPS
100.0000 mg | ORAL_CAPSULE | Freq: Two times a day (BID) | ORAL | 0 refills | Status: DC
Start: 2016-09-14 — End: 2016-09-22

## 2016-09-14 MED ORDER — MILK AND MOLASSES ENEMA
1.0000 | Freq: Once | RECTAL | Status: AC
  Administered 2016-09-14: 250 mL via RECTAL

## 2016-09-14 MED ORDER — INSULIN GLARGINE 100 UNIT/ML SOLOSTAR PEN: 30.0000 [IU] | PEN_INJECTOR | Freq: Every day | SUBCUTANEOUS | 11 refills | Status: DC

## 2016-09-14 MED ORDER — TRAMADOL HCL 50 MG PO TABS: 50.0000 mg | ORAL_TABLET | Freq: Four times a day (QID) | ORAL | 0 refills | Status: DC | PRN

## 2016-09-14 MED ORDER — EPOETIN ALFA 4000 UNIT/ML IJ SOLN
INTRAMUSCULAR | Status: AC
  Administered 2016-09-14: 4000 [IU] via SUBCUTANEOUS
  Filled 2016-09-14: qty 1

## 2016-09-14 NOTE — Progress Notes (Signed)
Patient is to be discharged to [facility], report called to Iona Beard, RN at [facility]. RN stated that they could not accept patient until patient had a BM. MD made aware, Enema given. Patient had BM, RN at [facility] made aware and patient transported via bed to [facility].   Celestia Khat, RN

## 2016-09-14 NOTE — Care Management Note (Signed)
Case Management Note  Patient Details  Name: [name] MRN: [id number] Date of Birth: 1955/09/09  Expected Discharge Date:  09/14/16               Expected Discharge Plan:  [place]  In-House Referral:  Clinical Social Work  Discharge planning Services  CM Consult  Post Acute Care Choice:  NA Choice offered to:  NA  Status of Service:  Completed, signed off  Additional Comments: Pt discharging to SNF today. CSW has made arrangements for placement. [facility] rep, linda, aware of change to DC plan.   Sherald Barge, RN 09/14/2016, 2:26 PM

## 2016-09-14 NOTE — Discharge Summary (Signed)
Physician Discharge Summary  [name] VFI:[id number] DOB: 1956-03-30 DOA: 09/08/2016  PCP: [name], MD  Admit date: 09/08/2016 Discharge date: 09/14/2016  Admitted From: Home Disposition:  Skilled nursing facility  Recommendations for Outpatient Follow-up:  1. Follow up with PCP in 1-2 weeks 2. Please obtain BMP/CBC in one week 3. Follow-up at dialysis Center as previously scheduled 4. Follow-up with general surgery, Dr. Arnoldo Morale as needed for abscess. Continue daily dressing changes.   Discharge Condition:Stable CODE STATUS:Full code Diet recommendation: Heart Healthy / Carb Modified  Brief/Interim Summary: 61 year old male with history of end-stage renal disease on dialysis, hypertension, diabetes, chronic combined CHF, presents to the hospital with right buttock abscess. Patient underwent incision and drainage in the emergency room. He was started on intravenous vancomycin with improvement of his infection. His abscess continues to drain. Overall surrounding cellulitis has improved. He's been afebrile for the past 24 hours. He does not have any leukocytosis. He has transitional course of oral antibiotics. He can follow-up with Dr. Arnoldo Morale, general surgery as needed. He was seen by nephrology for dialysis needs and she'll continue on schedule. Lantus was also adjusted for hyperglycemia and is currently on 30 units. This can be further adjusted afterdischarge. He was seen by physical therapy and recommended skilled nursing facility placement. He is felt stable for discharge today.  Discharge Diagnoses:  Principal Problem:   Abscess of buttock, right Active Problems:   Essential hypertension   DM (diabetes mellitus), type 2 with peripheral vascular complications (HCC)   Chronic combined systolic (congestive) and diastolic (congestive) heart failure (HCC)   ESRD (end stage renal disease) (HCC)   S/P bilateral BKA (below knee amputation) ([place])   Anemia due to chronic kidney disease  Abscess    Discharge Instructions  Discharge Instructions    Diet - low sodium heart healthy    Complete by:  As directed    Diet - low sodium heart healthy    Complete by:  As directed    Increase activity slowly    Complete by:  As directed    Increase activity slowly    Complete by:  As directed      Allergies as of 09/14/2016      Reactions   Acetazolamide Other (See Comments)   Jittery odd feeling. (hyper feeling)   Contrast Media [iodinated Diagnostic Agents] Hives   In the 80's    Other Other (See Comments)   Transfer Dye---"Makes me tired"      Medication List    TAKE these medications   acetaminophen 500 MG tablet Commonly known as:  TYLENOL Take 1,000 mg by mouth daily as needed for mild pain or headache.   amoxicillin-clavulanate 500-125 MG tablet Commonly known as:  AUGMENTIN Take 1 tablet (500 mg total) by mouth every evening.   aspirin 81 MG tablet Take 81 mg by mouth daily.   brimonidine 0.2 % ophthalmic solution Commonly known as:  ALPHAGAN Place 1 drop into the right eye every 8 (eight) hours.   calcium acetate 667 MG capsule Commonly known as:  PHOSLO Take 667 mg by mouth daily. With largest meal of the day   camphor-menthol lotion Commonly known as:  SARNA Apply 1 application topically every 8 (eight) hours as needed for itching.   carvedilol 25 MG tablet Commonly known as:  COREG Take 1 tablet (25 mg total) by mouth 2 (two) times daily.   cloNIDine 0.1 MG tablet Commonly known as:  CATAPRES Take 1 tablet (0.1 mg total) by  mouth at bedtime.   clopidogrel 75 MG tablet Commonly known as:  PLAVIX Take 75 mg by mouth daily.   doxycycline 100 MG capsule Commonly known as:  VIBRAMYCIN Take 1 capsule (100 mg total) by mouth 2 (two) times daily.   epoetin alfa 4000 UNIT/ML injection Commonly known as:  EPOGEN,PROCRIT Inject 1 mL (4,000 Units total) into the skin every Monday, Wednesday, and Friday with hemodialysis.   feeding  supplement (NEPRO CARB STEADY) Liqd Take 237 mLs by mouth 3 (three) times daily as needed (Supplement).   fenofibrate 160 MG tablet Take 160 mg by mouth daily.   folic acid 1 MG tablet Commonly known as:  FOLVITE Take 1 mg by mouth daily.   furosemide 20 MG tablet Commonly known as:  LASIX Take 80 mg by mouth 2 (two) times daily.   hydrOXYzine 25 MG tablet Commonly known as:  ATARAX/VISTARIL Take 1 tablet (25 mg total) by mouth every 8 (eight) hours as needed for itching.   Insulin Glargine 100 UNIT/ML Solostar Pen Commonly known as:  LANTUS SOLOSTAR Inject 30 Units into the skin at bedtime. What changed:  how much to take   latanoprost 0.005 % ophthalmic solution Commonly known as:  XALATAN Place 1 drop into the right eye at bedtime.   ofloxacin 0.3 % ophthalmic solution Commonly known as:  OCUFLOX Place 1 drop into the right eye 2 (two) times daily.   oxyCODONE 5 MG immediate release tablet Commonly known as:  Oxy IR/ROXICODONE Take 1 tablet (5 mg total) by mouth at bedtime as needed for moderate pain.   prednisoLONE acetate 1 % ophthalmic suspension Commonly known as:  PRED FORTE Place 1 drop into the right eye every 2 (two) hours while awake.   rosuvastatin 20 MG tablet Commonly known as:  CRESTOR Take 20 mg by mouth daily.   timolol 0.5 % ophthalmic solution Commonly known as:  TIMOPTIC Place 1 drop into the right eye 2 (two) times daily.   traMADol 50 MG tablet Commonly known as:  ULTRAM Take 1 tablet (50 mg total) by mouth every 6 (six) hours as needed.   Vitamin D (Ergocalciferol) 50000 units Caps capsule Commonly known as:  DRISDOL Take 50,000 Units by mouth once a week.      Contact information for after-discharge care    [facility] .   Specialty:  Skilled Nursing Facility Contact information: [address] [address] [phone]             Allergies  Allergen Reactions  .  Acetazolamide Other (See Comments)    Jittery odd feeling. (hyper feeling)  . Contrast Media [Iodinated Diagnostic Agents] Hives    In the 80's   . Other Other (See Comments)    Transfer Dye---"Makes me tired"    Consultations:  Nephrology   Procedures/Studies:  No results found. Incision and drainage of gluteal abscess done in emergency room   Subjective: Patient does complain of tenderness in his gluteal wound.  Discharge Exam: Vitals:   09/14/16 1200 09/14/16 1230  BP: (!) 145/74 (!) 153/73  Pulse: 66 63  Resp:    Temp:     Vitals:   09/14/16 1100 09/14/16 1130 09/14/16 1200 09/14/16 1230  BP: 137/67 127/61 (!) 145/74 (!) 153/73  Pulse: 69 67 66 63  Resp:      Temp:      TempSrc:      SpO2:      Weight:  Height:        General: Pt is alert, awake, not in acute distress Cardiovascular: RRR, S1/S2 +, no rubs, no gallops Respiratory: CTA bilaterally, no wheezing, no rhonchi Abdominal: Soft, NT, ND, bowel sounds + Extremities: bilateral BKA. Wound on right buttock continues to drain    The results of significant diagnostics from this hospitalization (including imaging, microbiology, ancillary and laboratory) are listed below for reference.     Microbiology: Recent Results (from the past 240 hour(s))  Culture, blood (routine x 2)     Status: None   Collection Time: 09/08/16  7:57 PM  Result Value Ref Range Status   Specimen Description RIGHT ANTECUBITAL  Final   Special Requests   Final    BOTTLES DRAWN AEROBIC AND ANAEROBIC Blood Culture adequate volume   Culture NO GROWTH 5 DAYS  Final   Report Status 09/13/2016 FINAL  Final  Culture, blood (routine x 2)     Status: None   Collection Time: 09/08/16  8:34 PM  Result Value Ref Range Status   Specimen Description RIGHT ANTECUBITAL  Final   Special Requests   Final    BOTTLES DRAWN AEROBIC AND ANAEROBIC Blood Culture adequate volume   Culture NO GROWTH 5 DAYS  Final   Report Status 09/13/2016  FINAL  Final  MRSA PCR Screening     Status: None   Collection Time: 09/08/16  9:00 PM  Result Value Ref Range Status   MRSA by PCR NEGATIVE NEGATIVE Final    Comment:        The GeneXpert MRSA Assay (FDA approved for NASAL specimens only), is one component of a comprehensive MRSA colonization surveillance program. It is not intended to diagnose MRSA infection nor to guide or monitor treatment for MRSA infections.      Labs: BNP (last 3 results)  Recent Labs  06/18/16 2310  BNP 0,947.0*   Basic Metabolic Panel:  Recent Labs Lab 09/08/16 1206 09/09/16 0438 09/10/16 0730 09/12/16 0540 09/14/16 0431  NA 132* 133* 134* 131* 135  K 3.6 3.7 3.5 3.5 3.6  CL 92* 97* 93* 92* 96*  CO2 28 26 30 28 30   GLUCOSE 442* 345* 348* 192* 173*  BUN 40* 48* 37* 68* 51*  CREATININE 3.31* 3.68* 3.31* 5.13* 4.20*  CALCIUM 8.5* 8.2* 8.2* 8.3* 8.0*  PHOS  --   --   --  3.3 3.0   Liver Function Tests:  Recent Labs Lab 09/12/16 0540 09/14/16 0431  ALBUMIN 2.2* 2.1*   No results for input(s): LIPASE, AMYLASE in the last 168 hours. No results for input(s): AMMONIA in the last 168 hours. CBC:  Recent Labs Lab 09/08/16 1206 09/09/16 0438 09/12/16 0540 09/14/16 0431  WBC 11.9* 11.5* 9.5 9.5  NEUTROABS 10.2*  --   --   --   HGB 10.8* 10.0* 9.0* 8.7*  HCT 33.7* 30.4* 27.4* 26.8*  MCV 91.1 90.7 91.0 92.1  PLT 201 190 231 227   Cardiac Enzymes: No results for input(s): CKTOTAL, CKMB, CKMBINDEX, TROPONINI in the last 168 hours. BNP: Invalid input(s): POCBNP CBG:  Recent Labs Lab 09/13/16 1113 09/13/16 1623 09/13/16 2159 09/14/16 0728 09/14/16 1126  GLUCAP 189* 206* 206* 154* 143*   D-Dimer No results for input(s): DDIMER in the last 72 hours. Hgb A1c No results for input(s): HGBA1C in the last 72 hours. Lipid Profile No results for input(s): CHOL, HDL, LDLCALC, TRIG, CHOLHDL, LDLDIRECT in the last 72 hours. Thyroid function studies No results for input(s): TSH,  T4TOTAL, T3FREE, THYROIDAB in the last 72 hours.  Invalid input(s): FREET3 Anemia work up No results for input(s): VITAMINB12, FOLATE, FERRITIN, TIBC, IRON, RETICCTPCT in the last 72 hours. Urinalysis    Component Value Date/Time   COLORURINE YELLOW 11/18/2010 0135   APPEARANCEUR CLEAR 11/18/2010 0135   LABSPEC 1.010 11/18/2010 0135   PHURINE 6.0 11/18/2010 0135   GLUCOSEU NEGATIVE 11/18/2010 0135   HGBUR NEGATIVE 11/18/2010 0135   BILIRUBINUR NEGATIVE 11/18/2010 0135   KETONESUR NEGATIVE 11/18/2010 0135   PROTEINUR NEGATIVE 11/18/2010 0135   UROBILINOGEN 0.2 11/18/2010 0135   NITRITE NEGATIVE 11/18/2010 0135   LEUKOCYTESUR NEGATIVE 11/18/2010 0135   Sepsis Labs Invalid input(s): PROCALCITONIN,  WBC,  LACTICIDVEN Microbiology Recent Results (from the past 240 hour(s))  Culture, blood (routine x 2)     Status: None   Collection Time: 09/08/16  7:57 PM  Result Value Ref Range Status   Specimen Description RIGHT ANTECUBITAL  Final   Special Requests   Final    BOTTLES DRAWN AEROBIC AND ANAEROBIC Blood Culture adequate volume   Culture NO GROWTH 5 DAYS  Final   Report Status 09/13/2016 FINAL  Final  Culture, blood (routine x 2)     Status: None   Collection Time: 09/08/16  8:34 PM  Result Value Ref Range Status   Specimen Description RIGHT ANTECUBITAL  Final   Special Requests   Final    BOTTLES DRAWN AEROBIC AND ANAEROBIC Blood Culture adequate volume   Culture NO GROWTH 5 DAYS  Final   Report Status 09/13/2016 FINAL  Final  MRSA PCR Screening     Status: None   Collection Time: 09/08/16  9:00 PM  Result Value Ref Range Status   MRSA by PCR NEGATIVE NEGATIVE Final    Comment:        The GeneXpert MRSA Assay (FDA approved for NASAL specimens only), is one component of a comprehensive MRSA colonization surveillance program. It is not intended to diagnose MRSA infection nor to guide or monitor treatment for MRSA infections.      Time coordinating discharge: Over  30 minutes  SIGNED:   Kathie Dike, MD  [facility] 09/14/2016, 1:57 PM Pager   If 7PM-7AM, please contact night-coverage [email or web address] Password [email or web address]

## 2016-09-14 NOTE — Procedures (Signed)
     HEMODIALYSIS TREATMENT NOTE:  4 hour low-heparin dialysis completed via left upper arm AVF (16g/antegrade). Goal met: 1.5 liters removed without interruption in ultrafiltration.  All blood was returned and hemostasis was achieved in 10 minutes.  Report given to Barbra Sarks, RN.  Rockwell Alexandria, RN, CDN

## 2016-09-14 NOTE — Progress Notes (Signed)
[name]  MRN: [id number]  DOB/AGE: [birth year] 61 y.o.  Primary Care Physician:Kim, Jeneen Rinks, MD  Admit date: 09/08/2016  Chief Complaint:  Chief Complaint  Patient presents with  . Abscess    S-Pt presented on  09/08/2016 with  Chief Complaint  Patient presents with  . Abscess  .    Pt offers no new complaints .Pt says " I am looking to go to SNF"   Meds . amoxicillin-clavulanate  1 tablet Oral QPM  . aspirin  81 mg Oral Daily  . brimonidine  1 drop Right Eye Q8H  . calcium acetate  667 mg Oral Q supper  . carvedilol  25 mg Oral BID WC  . cloNIDine  0.1 mg Oral QHS  . clopidogrel  75 mg Oral QHS  . docusate sodium  100 mg Oral BID  . enoxaparin (LOVENOX) injection  30 mg Subcutaneous Q24H  . epoetin (EPOGEN/PROCRIT) injection  4,000 Units Subcutaneous Q M,W,F-HD  . fenofibrate  160 mg Oral Daily  . folic acid  1 mg Oral Daily  . insulin aspart  0-15 Units Subcutaneous TID WC  . insulin aspart  0-5 Units Subcutaneous QHS  . insulin aspart  8 Units Subcutaneous TID WC  . insulin glargine  34 Units Subcutaneous QHS  . latanoprost  1 drop Right Eye QHS  . ofloxacin  1 drop Right Eye BID  . prednisoLONE acetate  1 drop Right Eye Q2H while awake  . rosuvastatin  20 mg Oral q1800  . timolol  1 drop Right Eye BID         Physical Exam: Vital signs in last 24 hours: Temp:  [98.5 F (36.9 C)-99.6 F (37.6 C)] 98.6 F (37 C) (06/13 0455) Pulse Rate:  [62-73] 63 (06/13 1230) Resp:  [18] 18 (06/13 0835) BP: (107-156)/(51-74) 153/73 (06/13 1230) SpO2:  [96 %-100 %] 97 % (06/13 0835) Weight:  [235 lb 14.3 oz (107 kg)] 235 lb 14.3 oz (107 kg) (06/13 0835) Weight change: -1 lb 15.7 oz (-0.9 kg) Last BM Date: 09/07/16  Intake/Output from previous day: 06/12 0701 - 06/13 0700 In: 840 [P.O.:840] Out: 500 [Urine:500] No intake/output data recorded.   Physical Exam: General- pt is awake,alert, oriented to time place and person Resp- No acute REsp distress,decreased  at bases. CVS- S1S2 regular in rate and rhythm GIT- BS+, soft, NT, ND EXT- 2+ LE Edema, NO Cyanosis           b/l bka Access- left AVF + bruit  Lab Results: CBC  Recent Labs  09/12/16 0540 09/14/16 0431  WBC 9.5 9.5  HGB 9.0* 8.7*  HCT 27.4* 26.8*  PLT 231 227    BMET  Recent Labs  09/12/16 0540 09/14/16 0431  NA 131* 135  K 3.5 3.6  CL 92* 96*  CO2 28 30  GLUCOSE 192* 173*  BUN 68* 51*  CREATININE 5.13* 4.20*  CALCIUM 8.3* 8.0*   Creat trend 2018  5.98-6.2 2016  4.53 2013   3.46 2012   2.0--3.0    MICRO Recent Results (from the past 240 hour(s))  Culture, blood (routine x 2)     Status: None   Collection Time: 09/08/16  7:57 PM  Result Value Ref Range Status   Specimen Description RIGHT ANTECUBITAL  Final   Special Requests   Final    BOTTLES DRAWN AEROBIC AND ANAEROBIC Blood Culture adequate volume   Culture NO GROWTH 5 DAYS  Final   Report Status 09/13/2016 FINAL  Final  Culture, blood (routine x 2)     Status: None   Collection Time: 09/08/16  8:34 PM  Result Value Ref Range Status   Specimen Description RIGHT ANTECUBITAL  Final   Special Requests   Final    BOTTLES DRAWN AEROBIC AND ANAEROBIC Blood Culture adequate volume   Culture NO GROWTH 5 DAYS  Final   Report Status 09/13/2016 FINAL  Final  MRSA PCR Screening     Status: None   Collection Time: 09/08/16  9:00 PM  Result Value Ref Range Status   MRSA by PCR NEGATIVE NEGATIVE Final    Comment:        The GeneXpert MRSA Assay (FDA approved for NASAL specimens only), is one component of a comprehensive MRSA colonization surveillance program. It is not intended to diagnose MRSA infection nor to guide or monitor treatment for MRSA infections.       Lab Results  Component Value Date   PTH 97 (H) 06/25/2016   CALCIUM 8.0 (L) 09/14/2016   CAION 1.17 10/07/2010   PHOS 3.0 09/14/2016     Impression: 1)Renal  ESRD.                HD initiated 06/21/16   Pt is  on  HD as outpt ( MOnday/wednesday/Friday schedule)    CKD since 2012   CKD secondary to Ischemic nephropathy/DM/Cardiorenal/HTN   Progression of CKD as expected with DM                         Pt to be dialyzed today.   2)HTN  Medication- On Vasodilators On Alpha and beta Blockers. On [employer].   3)Anemia In ESRD the goal for HGb is 9-11 On Epo during HD  4)CKD Mineral-Bone Disorder Phosphorus  at goal On binders Calcium at goal after correcting for low albumin  5)ID- admitted with abscess  s/p I & D Primary MD following  6)Electrolytes  Normokalemic  Hyponatremic   7)Acid base Co2 at goal  Plan:  Will continue current care. Will dialyze today   BHUTANI,MANPREET S 09/14/2016, 2:29 PM

## 2016-09-14 NOTE — Clinical Social Work Note (Signed)
LCSW advised Kerri at [facility] of patient's discharge and sent clinicals. Marianna Fuss advised that they had received authorization.   LCSW left a message for patient's brother, [name], advising that patient was discharging.   LCSW signing off.     Ransome Helwig, Clydene Pugh, LCSW

## 2016-09-14 NOTE — Care Management Important Message (Signed)
Important Message  Patient Details  Name: [name] MRN: [id number] Date of Birth: 06-May-1955   Medicare Important Message Given:  Yes    Sherald Barge, RN 09/14/2016, 2:24 PM

## 2016-09-14 NOTE — Clinical Social Work Placement (Signed)
   CLINICAL SOCIAL WORK PLACEMENT  NOTE  Date:  09/14/2016  Patient Details  Name: [name] MRN: [id number] Date of Birth: [birth year]  Clinical Social Work is seeking post-discharge placement for this patient at the [place] level of care (*CSW will initial, date and re-position this form in  chart as items are completed):  Yes   Patient/family provided with [place] Work Department's list of facilities offering this level of care within the geographic area requested by the patient (or if unable, by the patient's family).  Yes   Patient/family informed of their freedom to choose among providers that offer the needed level of care, that participate in Medicare, Medicaid or managed care program needed by the patient, have an available bed and are willing to accept the patient.  Yes   Patient/family informed of [facility]'s ownership interest in [facility] and [facility], as well as of the fact that they are under no obligation to receive care at these facilities.  PASRR submitted to EDS on       PASRR number received on       Existing PASRR number confirmed on 09/13/16     FL2 transmitted to all facilities in geographic area requested by pt/family on 09/13/16     FL2 transmitted to all facilities within larger geographic area on       Patient informed that his/her managed care company has contracts with or will negotiate with certain facilities, including the following:            Patient/family informed of bed offers received.  Patient chooses bed at [facility]     Physician recommends and patient chooses bed at      Patient to be transferred to [facility] on 09/14/16.  Patient to be transferred to facility by [facility] Staff     Patient family notified on 09/14/16 of transfer.  Name of family member notified:  Demetrio Leighty, message left on mobile number      PHYSICIAN Please sign FL2     Additional Comment:     _______________________________________________ Ihor Gully, LCSW 09/14/2016, 3:37 PM

## 2016-09-14 NOTE — Progress Notes (Signed)
LCSW following for disposition of needs. Patient has been approved for SNF per [place]:   Bed approved at [facility].  Auth:  [id number]  For 3 days  RUG level:  RVB 525 therapy hours per week Apolonio Schneiders contact for [facility]:  [phone]  X 9866 Fax for clinicals:  [phone]   All information communicated with admissions at [facility]. No barriers with admission for today unless MD has continued medical needs.  Patient will transfer by facility staff Patient will update his brothers regarding disposition and discharge.  Plan: SNF.  Lane Hacker, MSW Clinical Social Work: [employer] Coverage for :  [phone]

## 2016-09-14 NOTE — Progress Notes (Signed)
Inpatient Diabetes Program Recommendations  AACE/ADA: New Consensus Statement on Inpatient Glycemic Control (2015)  Target Ranges:  Prepandial:   less than 140 mg/dL      Peak postprandial:   less than 180 mg/dL (1-2 hours)      Critically ill patients:  140 - 180 mg/dL   Results for [name], [name] (MRN [id number]) as of 09/14/2016 07:52  Ref. Range 09/13/2016 07:44 09/13/2016 11:13 09/13/2016 16:23 09/13/2016 21:59 09/14/2016 07:28  Glucose-Capillary Latest Ref Range: 65 - 99 mg/dL 181 (H) 189 (H) 206 (H) 206 (H) 154 (H)   Review of Glycemic Control Outpatient DM medications: Lantus 5 units QHS Current orders for Inpatient glycemic control: Lantus 34 units QHS, Novolog 0-15 units TID with meals, Novolog 0-5 units QHS, Novolog 8 units TID with meals  Inpatient Diabetes Program Recommendations: Insulin - Meal Coverage: Please consider increasing meal coverage to Novolog 12 units TID with meals. HgbA1C: Please consider ordering an A1C to evaluate glycemic control over the past 2-3 months.  Outpatient DM medications: At time of discharge, MD will likely need to adjust outpatient DM medication regimen similar to what is currently ordered.  NOTE: In reviewing chart noted patient was hospitalized from 06/19/16 to 06/28/16 and patient was taking Lantus 50 units QHS and Humalog 10 units TID with meals at time of admission on 06/19/16 and per discharge summary on 06/28/16 "on high dose lantus and humalog at home, noted to require very little insulin inpatient, resumed atlantus at 5units QHS, stable, will need dose titration as outpatient". Initial glucose was 442 mg/dl when patient presented to the hospital on 09/08/16. Question if patient has followed up with PCP regarding DM control since last discharge on 06/28/16 when insulin was decreased significantly. Outpatient DM medications will most likely need to be adjusted at time of discharge and patient needs to follow up with PCP in the very near future.    Thanks, Barnie Alderman, RN, MSN, CDE Diabetes Coordinator Inpatient Diabetes Program [phone] (Team Pager from 8am to 5pm)

## 2016-09-15 ENCOUNTER — Other Ambulatory Visit: Payer: Self-pay | Admitting: *Deleted

## 2016-09-15 ENCOUNTER — Encounter: Payer: Self-pay | Admitting: Internal Medicine

## 2016-09-15 ENCOUNTER — Non-Acute Institutional Stay (SKILLED_NURSING_FACILITY): Payer: Medicare Other | Admitting: Internal Medicine

## 2016-09-15 DIAGNOSIS — D649 Anemia, unspecified: Secondary | ICD-10-CM | POA: Insufficient documentation

## 2016-09-15 DIAGNOSIS — Z89512 Acquired absence of left leg below knee: Secondary | ICD-10-CM | POA: Diagnosis not present

## 2016-09-15 DIAGNOSIS — I5042 Chronic combined systolic (congestive) and diastolic (congestive) heart failure: Secondary | ICD-10-CM

## 2016-09-15 DIAGNOSIS — N186 End stage renal disease: Secondary | ICD-10-CM

## 2016-09-15 DIAGNOSIS — E785 Hyperlipidemia, unspecified: Secondary | ICD-10-CM

## 2016-09-15 DIAGNOSIS — L0231 Cutaneous abscess of buttock: Secondary | ICD-10-CM

## 2016-09-15 DIAGNOSIS — D631 Anemia in chronic kidney disease: Secondary | ICD-10-CM

## 2016-09-15 DIAGNOSIS — Z89511 Acquired absence of right leg below knee: Secondary | ICD-10-CM

## 2016-09-15 DIAGNOSIS — Z992 Dependence on renal dialysis: Secondary | ICD-10-CM

## 2016-09-15 MED ORDER — OXYCODONE HCL 5 MG PO TABS: 5.0000 mg | ORAL_TABLET | Freq: Every evening | ORAL | 0 refills | Status: DC | PRN

## 2016-09-15 MED ORDER — TRAMADOL HCL 50 MG PO TABS: 50.0000 mg | ORAL_TABLET | Freq: Four times a day (QID) | ORAL | 0 refills | Status: DC | PRN

## 2016-09-15 NOTE — Telephone Encounter (Signed)
[facility] #[phone] Fax: [phone]   

## 2016-09-15 NOTE — Progress Notes (Signed)
Provider: Veleta Miners  Location:    [facility] Room Number: 126/P Place of Service:  SNF (31)  PCP: [name], MD Patient Care Team: [name], MD as PCP - General (Internal Medicine) Liana Gerold, MD as Consulting Physician (Nephrology) Fran Lowes, MD as Referring Physician (Nephrology)  Extended Emergency Contact Information Primary Emergency Contact: Lauree Chandler Address: [address]          [place], [place] [address] [place] Phone: [phone] Relation: Brother Secondary Emergency Contact: Kallio,Roger Address: [address]          [place], [place] [address] [place] Phone: [phone] Mobile Phone: [phone] Relation: Brother  Code Status: Full Code Goals of Care: Advanced Directive information Advanced Directives 09/15/2016  Does Patient Have a Medical Advance Directive? Yes  Type of Advance Directive (No Data)  Does patient want to make changes to medical advance directive? No - Patient declined  Would patient like information on creating a medical advance directive? No - Patient declined      Chief Complaint  Patient presents with  . New Admit To SNF    HPI: Patient is a 61 y.o. male seen today for admission to SNF for therapy.  Patient has h/o  ESRD on Dialysis MWF, Diabetes Mellitus , Bilateral BKA, AICD CHF with EF of 35-40% in 03/18, Hypertension, Hyperlipidemia, Anemia And PVD, Proliferative Diabetic retinopathy  He was admitted in the hospital after he came with Boil in his Right Buttock. He underwent Incision and Drainage in ED. And was started on Augmentin. His abscess improved and he was transitioned to oral Antibiotics. He was discharged for therapy as  He lives by himself and is wheelchair bound. It has been hard for him to move around with wheelchair due to his Wound. Patient is doing well in facility. He is able to transfer himself on the Chair. Denies any  fever or chills. Pain is controlled. He says he is arranging everything in his Apartment and will eventually go home.  Past Medical History:  Diagnosis Date  . AICD (automatic cardioverter/defibrillator) present   . Anemia   . Arthritis   . Cerebrovascular disease   . CHF (congestive heart failure) ([place])   . Chronic kidney disease    ESRD, MWF HD  . Diabetes mellitus    type II  . History of TIAs   . Hyperlipidemia   . Hypertension   . Nonischemic cardiomyopathy ([place])   . PVD (peripheral vascular disease) ([place])    s/p B BKA  . Shortness of breath dyspnea    with exertion   . Skin disease    Rare  . Stroke [facility])    "light stroke"  . Tobacco abuse    Past Surgical History:  Procedure Laterality Date  . A/V SHUNTOGRAM Left 06/24/2016   Procedure: A/V Fistulagram;  Surgeon: Elam Dutch, MD;  Location: [facility] CV LAB;  Service: Cardiovascular;  Laterality: Left;  . AV FISTULA PLACEMENT Left 04/08/2015   Procedure: Creation of Left arm BRACHIOCEPHALIC ARTERIOVENOUS  FISTULA ;  Surgeon: Mal Misty, MD;  Location: [facility];  Service: Vascular;  Laterality: Left;  . CARDIAC DEFIBRILLATOR PLACEMENT    . CARDIAC DEFIBRILLATOR PLACEMENT    . CATARACT EXTRACTION W/PHACO Right 03/17/2015   Procedure: CATARACT EXTRACTION PHACO AND INTRAOCULAR LENS PLACEMENT (IOC);  Surgeon: Rutherford Guys, MD;  Location: [facility];  Service: Ophthalmology;  Laterality: Right;  CDE: 6.59  . EYE  SURGERY Right    Cataract  . LEG AMPUTATION BELOW KNEE     bilateral    reports that he quit smoking about 18 years ago. He has a 20.00 pack-year smoking history. He has never used smokeless tobacco. He reports that he drinks alcohol. He reports that he does not use drugs. Social History   Social History  . Marital status: Legally Separated    Spouse name: N/A  . Number of children: N/A  . Years of education: N/A   Occupational History  . Disabled Biochemist, clinical   Social History Main Topics  .  Smoking status: Former Smoker    Packs/day: 1.00    Years: 20.00    Quit date: 03/12/1998  . Smokeless tobacco: Never Used  . Alcohol use 0.0 oz/week     Comment: occasional  . Drug use: No  . Sexual activity: Not on file   Other Topics Concern  . Not on file   Social History Narrative   Divorced   No regular exercise   Occasional caffeine use    Functional Status Survey:    Family History  Problem Relation Age of Onset  . Diabetes Father   . Stroke Father   . Diabetes Brother   . Diabetes Brother   . Diabetes Brother   . Diabetes Brother   . Heart failure Mother   . Diabetes Mother   . Diabetes Other   . Coronary artery disease Other     Health Maintenance  Topic Date Due  . FOOT EXAM  04/25/1963  . OPHTHALMOLOGY EXAM  04/25/1963  . URINE MICROALBUMIN  04/25/1963  . TETANUS/TDAP  04/24/1972  . COLONOSCOPY  04/25/2003  . INFLUENZA VACCINE  11/02/2016  . HEMOGLOBIN A1C  12/27/2016  . PNEUMOCOCCAL POLYSACCHARIDE VACCINE (2) 06/20/2021  . Hepatitis C Screening  Completed  . HIV Screening  Completed    Allergies  Allergen Reactions  . Acetazolamide Other (See Comments)    Jittery odd feeling. (hyper feeling)  . Contrast Media [Iodinated Diagnostic Agents] Hives    In the 80's   . Other Other (See Comments)    Transfer Dye---"Makes me tired"    Allergies as of 09/15/2016      Reactions   Acetazolamide Other (See Comments)   Jittery odd feeling. (hyper feeling)   Contrast Media [iodinated Diagnostic Agents] Hives   In the 80's    Other Other (See Comments)   Transfer Dye---"Makes me tired"      Medication List    Notice   This visit is during an admission. Changes to the med list made in this visit will be reflected in the After Visit Summary of the admission.     Review of Systems  Review of Systems  Constitutional: Negative for activity change, appetite change, chills, diaphoresis, fatigue and fever.  HENT: Negative for mouth sores, postnasal  drip, rhinorrhea, sinus pain and sore throat.   Respiratory: Negative for apnea, cough, chest tightness, shortness of breath and wheezing.   Cardiovascular: Negative for chest pain, palpitations  Gastrointestinal: Negative for abdominal distention, abdominal pain, constipation, diarrhea, nausea and vomiting.  Genitourinary: Negative for dysuria and frequency.  Musculoskeletal: Negative for arthralgias, joint swelling and myalgias.  Skin: Negative for rash.  Neurological: Negative for dizziness, syncope, weakness, light-headedness and numbness.  Psychiatric/Behavioral: Negative for behavioral problems, confusion and sleep disturbance.     Vitals:   09/15/16 1107  BP: 130/73  Pulse: 77  Resp: 20  Temp: 98.4 F (36.9 C)  There is no height or weight on file to calculate BMI. Physical Exam  Constitutional: He is oriented to person, place, and time. He appears well-developed and well-nourished.  HENT:  Head: Normocephalic.  Mouth/Throat: Oropharynx is clear and moist.  Eyes: Pupils are equal, round, and reactive to light.  Neck: Neck supple.  Cardiovascular: Normal rate, regular rhythm and normal heart sounds.   Pulmonary/Chest: Effort normal and breath sounds normal. No respiratory distress. He has no wheezes. He has no rales.  Abdominal: Soft. Bowel sounds are normal. He exhibits no distension. There is no tenderness. There is no rebound.  Musculoskeletal:  S/P Bilateral BKA  Neurological: He is alert and oriented to person, place, and time.  No focal deficits  Skin: Skin is warm and dry.  Wound had some smell but otherwise is clean and was  Packed. No drainage. Cellulitis resolved   Psychiatric: He has a normal mood and affect. His behavior is normal. Thought content normal.    Labs reviewed: Basic Metabolic Panel:  Recent Labs  06/29/16 0657  09/10/16 0730 09/12/16 0540 09/14/16 0431  NA 140  < > 134* 131* 135  K 4.6  < > 3.5 3.5 3.6  CL 102  < > 93* 92* 96*  CO2  27  < > 30 28 30   GLUCOSE 164*  < > 348* 192* 173*  BUN 40*  < > 37* 68* 51*  CREATININE 4.25*  < > 3.31* 5.13* 4.20*  CALCIUM 8.4*  < > 8.2* 8.3* 8.0*  PHOS 3.4  --   --  3.3 3.0  < > = values in this interval not displayed. Liver Function Tests:  Recent Labs  06/29/16 0657 09/12/16 0540 09/14/16 0431  ALBUMIN 3.1* 2.2* 2.1*   No results for input(s): LIPASE, AMYLASE in the last 8760 hours. No results for input(s): AMMONIA in the last 8760 hours. CBC:  Recent Labs  06/18/16 2310  06/22/16 1525  09/08/16 1206 09/09/16 0438 09/12/16 0540 09/14/16 0431  WBC 12.6*  < > 7.5  < > 11.9* 11.5* 9.5 9.5  NEUTROABS 11.0*  --  5.7  --  10.2*  --   --   --   HGB 9.0*  < > 8.9*  < > 10.8* 10.0* 9.0* 8.7*  HCT 28.6*  < > 27.5*  < > 33.7* 30.4* 27.4* 26.8*  MCV 93.8  < > 93.2  < > 91.1 90.7 91.0 92.1  PLT 228  < > 191  < > 201 190 231 227  < > = values in this interval not displayed. Cardiac Enzymes:  Recent Labs  06/19/16 0545 06/19/16 1143 06/19/16 1606  TROPONINI 0.04* 0.04* 0.04*   BNP: Invalid input(s): POCBNP Lab Results  Component Value Date   HGBA1C 5.4 06/26/2016   No results found for: TSH No results found for: VITAMINB12 No results found for: FOLATE Lab Results  Component Value Date   IRON 34 (L) 06/21/2016   TIBC 358 06/21/2016   FERRITIN 82 06/21/2016    Imaging and Procedures obtained prior to SNF admission: No results found.  Assessment/Plan Abscess of  Right buttock Continue Augmentin and doxycycline for 1 more week Dressing change everyday. Follow up with Dr Arnoldo Morale.  Chronic Systolic CHF Doing well Since been on Dialysis   ESRD (end stage renal disease)  Continue Dialysis MWF  S/P bilateral BKA  Working with therapy Will be able to discharge home  Hyperlipidemia Conitnue Crestor and finofibrate  Anemia in chronic kidney disease Will follow  Hgb  Diabetes Mellitus type 2 Bs Stable in facility some peaks in evening. Continue Accu  check and Insulin. He was on 5 units at home so 30 units of Lantus  is big increase for him Family/ staff Communication:   Labs/tests ordered: CMP and CBC in 1 week.   Total time spent in this patient care encounter was _45 minutes; greater than 50% of the visit spent counseling patient and coordinating care for problems addressed at this encounter.

## 2016-09-16 DIAGNOSIS — D631 Anemia in chronic kidney disease: Secondary | ICD-10-CM | POA: Diagnosis not present

## 2016-09-16 DIAGNOSIS — N186 End stage renal disease: Secondary | ICD-10-CM | POA: Diagnosis not present

## 2016-09-16 DIAGNOSIS — Z23 Encounter for immunization: Secondary | ICD-10-CM | POA: Diagnosis not present

## 2016-09-16 DIAGNOSIS — D509 Iron deficiency anemia, unspecified: Secondary | ICD-10-CM | POA: Diagnosis not present

## 2016-09-16 DIAGNOSIS — Z992 Dependence on renal dialysis: Secondary | ICD-10-CM | POA: Diagnosis not present

## 2016-09-19 DIAGNOSIS — N186 End stage renal disease: Secondary | ICD-10-CM | POA: Diagnosis not present

## 2016-09-19 DIAGNOSIS — Z992 Dependence on renal dialysis: Secondary | ICD-10-CM | POA: Diagnosis not present

## 2016-09-19 DIAGNOSIS — D509 Iron deficiency anemia, unspecified: Secondary | ICD-10-CM | POA: Diagnosis not present

## 2016-09-19 DIAGNOSIS — D631 Anemia in chronic kidney disease: Secondary | ICD-10-CM | POA: Diagnosis not present

## 2016-09-19 DIAGNOSIS — Z23 Encounter for immunization: Secondary | ICD-10-CM | POA: Diagnosis not present

## 2016-09-21 ENCOUNTER — Encounter (HOSPITAL_COMMUNITY)
Admission: RE | Admit: 2016-09-21 | Discharge: 2016-09-21 | Disposition: A | Discharge status: Home / Self Care | Payer: Medicare Other | Source: Skilled Nursing Facility | Attending: Internal Medicine | Admitting: Internal Medicine

## 2016-09-21 DIAGNOSIS — D509 Iron deficiency anemia, unspecified: Secondary | ICD-10-CM | POA: Diagnosis not present

## 2016-09-21 DIAGNOSIS — Z992 Dependence on renal dialysis: Secondary | ICD-10-CM | POA: Diagnosis not present

## 2016-09-21 DIAGNOSIS — E785 Hyperlipidemia, unspecified: Secondary | ICD-10-CM | POA: Insufficient documentation

## 2016-09-21 DIAGNOSIS — L0231 Cutaneous abscess of buttock: Secondary | ICD-10-CM | POA: Insufficient documentation

## 2016-09-21 DIAGNOSIS — E1159 Type 2 diabetes mellitus with other circulatory complications: Secondary | ICD-10-CM | POA: Insufficient documentation

## 2016-09-21 DIAGNOSIS — D631 Anemia in chronic kidney disease: Secondary | ICD-10-CM | POA: Diagnosis not present

## 2016-09-21 DIAGNOSIS — I428 Other cardiomyopathies: Secondary | ICD-10-CM | POA: Insufficient documentation

## 2016-09-21 DIAGNOSIS — Z23 Encounter for immunization: Secondary | ICD-10-CM | POA: Diagnosis not present

## 2016-09-21 DIAGNOSIS — N186 End stage renal disease: Secondary | ICD-10-CM | POA: Diagnosis not present

## 2016-09-21 DIAGNOSIS — I10 Essential (primary) hypertension: Secondary | ICD-10-CM | POA: Insufficient documentation

## 2016-09-21 LAB — CBC WITH DIFFERENTIAL/PLATELET
BASOS ABS: 0 10*3/uL (ref 0.0–0.1)
BASOS PCT: 0 %
EOS ABS: 0.3 10*3/uL (ref 0.0–0.7)
Eosinophils Relative: 3 %
HCT: 23.2 % — ABNORMAL LOW (ref 39.0–52.0)
HEMOGLOBIN: 7.2 g/dL — AB (ref 13.0–17.0)
LYMPHS ABS: 1.4 10*3/uL (ref 0.7–4.0)
Lymphocytes Relative: 16 %
MCH: 30 pg (ref 26.0–34.0)
MCHC: 31 g/dL (ref 30.0–36.0)
MCV: 96.7 fL (ref 78.0–100.0)
Monocytes Absolute: 0.6 10*3/uL (ref 0.1–1.0)
Monocytes Relative: 7 %
NEUTROS PCT: 74 %
Neutro Abs: 6.5 10*3/uL (ref 1.7–7.7)
Platelets: 347 10*3/uL (ref 150–400)
RBC: 2.4 MIL/uL — AB (ref 4.22–5.81)
RDW: 14.6 % (ref 11.5–15.5)
WBC: 8.7 10*3/uL (ref 4.0–10.5)

## 2016-09-21 LAB — COMPREHENSIVE METABOLIC PANEL
ALBUMIN: 2.4 g/dL — AB (ref 3.5–5.0)
ALK PHOS: 51 U/L (ref 38–126)
ALT: 13 U/L — AB (ref 17–63)
AST: 19 U/L (ref 15–41)
Anion gap: 8 (ref 5–15)
BUN: 43 mg/dL — AB (ref 6–20)
CALCIUM: 8.2 mg/dL — AB (ref 8.9–10.3)
CHLORIDE: 102 mmol/L (ref 101–111)
CO2: 31 mmol/L (ref 22–32)
CREATININE: 3.99 mg/dL — AB (ref 0.61–1.24)
GFR calc Af Amer: 17 mL/min — ABNORMAL LOW (ref >60)
GFR calc non Af Amer: 15 mL/min — ABNORMAL LOW (ref >60)
GLUCOSE: 93 mg/dL (ref 65–99)
Potassium: 4 mmol/L (ref 3.5–5.1)
SODIUM: 141 mmol/L (ref 135–145)
Total Bilirubin: 0.6 mg/dL (ref 0.3–1.2)
Total Protein: 5.9 g/dL — ABNORMAL LOW (ref 6.5–8.1)

## 2016-09-22 ENCOUNTER — Encounter: Payer: Self-pay | Admitting: Internal Medicine

## 2016-09-22 ENCOUNTER — Non-Acute Institutional Stay (SKILLED_NURSING_FACILITY): Payer: Medicare Other | Admitting: Internal Medicine

## 2016-09-22 ENCOUNTER — Other Ambulatory Visit (HOSPITAL_COMMUNITY)
Admission: AD | Admit: 2016-09-22 | Discharge: 2016-09-22 | Disposition: A | Discharge status: Home / Self Care | Payer: Medicare Other | Source: Skilled Nursing Facility | Attending: Internal Medicine | Admitting: Internal Medicine

## 2016-09-22 DIAGNOSIS — I10 Essential (primary) hypertension: Secondary | ICD-10-CM | POA: Insufficient documentation

## 2016-09-22 DIAGNOSIS — L0231 Cutaneous abscess of buttock: Secondary | ICD-10-CM

## 2016-09-22 DIAGNOSIS — D631 Anemia in chronic kidney disease: Secondary | ICD-10-CM

## 2016-09-22 DIAGNOSIS — E1151 Type 2 diabetes mellitus with diabetic peripheral angiopathy without gangrene: Secondary | ICD-10-CM

## 2016-09-22 DIAGNOSIS — N186 End stage renal disease: Secondary | ICD-10-CM

## 2016-09-22 DIAGNOSIS — I5042 Chronic combined systolic (congestive) and diastolic (congestive) heart failure: Secondary | ICD-10-CM

## 2016-09-22 DIAGNOSIS — Z992 Dependence on renal dialysis: Secondary | ICD-10-CM | POA: Insufficient documentation

## 2016-09-22 LAB — BASIC METABOLIC PANEL
Anion gap: 9 (ref 5–15)
BUN: 37 mg/dL — AB (ref 6–20)
CALCIUM: 8 mg/dL — AB (ref 8.9–10.3)
CO2: 32 mmol/L (ref 22–32)
Chloride: 96 mmol/L — ABNORMAL LOW (ref 101–111)
Creatinine, Ser: 3.54 mg/dL — ABNORMAL HIGH (ref 0.61–1.24)
GFR calc Af Amer: 20 mL/min — ABNORMAL LOW (ref >60)
GFR, EST NON AFRICAN AMERICAN: 17 mL/min — AB (ref >60)
GLUCOSE: 247 mg/dL — AB (ref 65–99)
Potassium: 4 mmol/L (ref 3.5–5.1)
Sodium: 137 mmol/L (ref 135–145)

## 2016-09-22 LAB — CBC WITH DIFFERENTIAL/PLATELET
Basophils Absolute: 0 10*3/uL (ref 0.0–0.1)
Basophils Relative: 0 %
EOS PCT: 2 %
Eosinophils Absolute: 0.2 10*3/uL (ref 0.0–0.7)
HCT: 23.7 % — ABNORMAL LOW (ref 39.0–52.0)
Hemoglobin: 7.3 g/dL — ABNORMAL LOW (ref 13.0–17.0)
LYMPHS ABS: 1.4 10*3/uL (ref 0.7–4.0)
LYMPHS PCT: 13 %
MCH: 30 pg (ref 26.0–34.0)
MCHC: 30.8 g/dL (ref 30.0–36.0)
MCV: 97.5 fL (ref 78.0–100.0)
MONO ABS: 0.5 10*3/uL (ref 0.1–1.0)
Monocytes Relative: 5 %
Neutro Abs: 8.3 10*3/uL — ABNORMAL HIGH (ref 1.7–7.7)
Neutrophils Relative %: 80 %
PLATELETS: 316 10*3/uL (ref 150–400)
RBC: 2.43 MIL/uL — ABNORMAL LOW (ref 4.22–5.81)
RDW: 14.8 % (ref 11.5–15.5)
WBC: 10.4 10*3/uL (ref 4.0–10.5)

## 2016-09-22 NOTE — Progress Notes (Signed)
Location:   [facility] Room Number: 126/P Place of Service:  SNF [phone]) Provider:  Lucretia Field, MD  Patient Care Team: [name], MD as PCP - General (Internal Medicine) Liana Gerold, MD as Consulting Physician (Nephrology) Fran Lowes, MD as Referring Physician (Nephrology)  Extended Emergency Contact Information Primary Emergency Contact: Disney,Mike Address: [address]          [place], [place] [address] [place] Phone: [phone] Relation: Brother Secondary Emergency Contact: Uppal,Roger Address: [address]          [place], [place] [address] [place] Phone: [phone] Mobile Phone: [phone] Relation: Brother  Code Status:  Full Code Goals of care: Advanced Directive information Advanced Directives 09/22/2016  Does Patient Have a Medical Advance Directive? Yes  Type of Advance Directive (No Data)  Does patient want to make changes to medical advance directive? No - Patient declined  Would patient like information on creating a medical advance directive? No - Patient declined     Chief Complaint  Patient presents with  . Acute Visit    Anemia  With hemoglobin in the low sevens  HPI:  Pt is a 61 y.o. male seen today for an acute visit for follow-up of anemia.  Patient does have a history of end-stage renal disease on hemodialysis with a history of chronic anemia-previous hemoglobins have been as high as 10.8--2 weeks ago recently have trended down somewhat down to 8.7 last week yesterday was found to be 7.2 this is rechecked today and was found to be 7.3.  He has normocytic but MCV is on the higher end at 97.5.  He does not report any increased weakness shortness of breath or chest pain says he feels well.  He was admitted the hospital after he recovered have a boil on his right buttocks-he underwent incision and drainage in the ED and was started on Augmentin  and his abscess improved. He was transitioned to oral antibiotics-he was discharged from therapy here does live by himself in his wheelchair bound with bilateral lower extremity amputations.  Additional diagnoses include diabetes-CHF with ejection fraction of 35-40%-hypertension-hyperlipidemia-full vascular disease as well as proliferative diabetic retinopathy.  -Regards to anemia he did have iron studies done in March 2018 which show a low iron most recently 44-total iron-binding capacity was 372 ferritin was borderline low in the 80s.  Does not report a significant GI issue I do not see colonoscopy listed what he explains sounds like he had what probably  Was one y back in the 1980s although the reason was unclear-apparently the findings were quite benign according patient  I did test for occult blood rectally and this was negative  His other medical issues appear stable he does receive dialysis does have history of CHF he is on Lasix 80 milligrams twice a day.  Does not complain of increased edema or shortness of breath. And is tolerating dialysis well.  Does have a history of diabetes he is on 30 units of Lantus at night blood sugars in the morning. Be more mid 100s occasionally slightly above 200-later in the day has mid 100 readings but has more frequent readings somewhat over 200.  Last hemoglobin A1c was 5.4 back in March we'll update this                 Past Medical History:  Diagnosis Date  . AICD (automatic cardioverter/defibrillator) present   .  Anemia   . Arthritis   . Cerebrovascular disease   . CHF (congestive heart failure) ([place])   . Chronic kidney disease    ESRD, MWF HD  . Diabetes mellitus    type II  . History of TIAs   . Hyperlipidemia   . Hypertension   . Nonischemic cardiomyopathy ([place])   . PVD (peripheral vascular disease) ([place])    s/p B BKA  . Shortness of breath dyspnea    with exertion   . Skin disease    Rare  . Stroke [facility])     "light stroke"  . Tobacco abuse    Past Surgical History:  Procedure Laterality Date  . A/V SHUNTOGRAM Left 06/24/2016   Procedure: A/V Fistulagram;  Surgeon: Elam Dutch, MD;  Location: [facility];  Service: Cardiovascular;  Laterality: Left;  . AV FISTULA PLACEMENT Left 04/08/2015   Procedure: Creation of Left arm BRACHIOCEPHALIC ARTERIOVENOUS  FISTULA ;  Surgeon: Mal Misty, MD;  Location: [facility];  Service: Vascular;  Laterality: Left;  . CARDIAC DEFIBRILLATOR PLACEMENT    . CARDIAC DEFIBRILLATOR PLACEMENT    . CATARACT EXTRACTION W/PHACO Right 03/17/2015   Procedure: CATARACT EXTRACTION PHACO AND INTRAOCULAR LENS PLACEMENT (IOC);  Surgeon: Rutherford Guys, MD;  Location: [facility];  Service: Ophthalmology;  Laterality: Right;  CDE: 6.59  . EYE SURGERY Right    Cataract  . LEG AMPUTATION BELOW KNEE     bilateral    Allergies  Allergen Reactions  . Acetazolamide Other (See Comments)    Jittery odd feeling. (hyper feeling)  . Contrast Media [Iodinated Diagnostic Agents] Hives    In the 80's   . Other Other (See Comments)    Transfer Dye---"Makes me tired"    Outpatient Encounter Prescriptions as of 09/22/2016  Medication Sig  . acetaminophen (TYLENOL) 500 MG tablet Take 1,000 mg by mouth daily as needed for mild pain or headache.   Marland Kitchen amoxicillin-clavulanate (AUGMENTIN) 500-125 MG tablet Take 1 tablet (500 mg total) by mouth every evening.  Marland Kitchen aspirin 81 MG tablet Take 81 mg by mouth daily.    . brimonidine (ALPHAGAN) 0.2 % ophthalmic solution Place 1 drop into the right eye every 8 (eight) hours.  . calcium acetate (PHOSLO) 667 MG capsule Take 667 mg by mouth daily. With largest meal of the day  . camphor-menthol (SARNA) lotion Apply 1 application topically every 8 (eight) hours as needed for itching.  . carvedilol (COREG) 25 MG tablet Take 1 tablet (25 mg total) by mouth 2 (two) times daily.  . cloNIDine (CATAPRES) 0.1 MG tablet Take 1 tablet (0.1 mg total) by mouth at  bedtime.  . clopidogrel (PLAVIX) 75 MG tablet Take 75 mg by mouth daily.    . fenofibrate 160 MG tablet Take 160 mg by mouth daily.  . folic acid (FOLVITE) 1 MG tablet Take 1 mg by mouth daily.  . furosemide (LASIX) 20 MG tablet Take 80 mg by mouth 2 (two) times daily.   . hydrOXYzine (ATARAX/VISTARIL) 25 MG tablet Take 1 tablet (25 mg total) by mouth every 8 (eight) hours as needed for itching.  . Insulin Glargine (LANTUS SOLOSTAR) 100 UNIT/ML Solostar Pen Inject 30 Units into the skin at bedtime.  Marland Kitchen latanoprost (XALATAN) 0.005 % ophthalmic solution Place 1 drop into the right eye at bedtime.  . Nutritional Supplements (FEEDING SUPPLEMENT, NEPRO CARB STEADY,) LIQD Take 237 mLs by mouth 3 (three) times daily as needed (Supplement).  Marland Kitchen ofloxacin (OCUFLOX) 0.3 %  ophthalmic solution Place 1 drop into the right eye 2 (two) times daily.  Marland Kitchen oxyCODONE (OXY IR/ROXICODONE) 5 MG immediate release tablet Take 1 tablet (5 mg total) by mouth at bedtime as needed for moderate pain.  . prednisoLONE acetate (PRED FORTE) 1 % ophthalmic suspension Place 1 drop into the right eye every 2 (two) hours while awake.  . rosuvastatin (CRESTOR) 20 MG tablet Take 20 mg by mouth daily.  . timolol (TIMOPTIC) 0.5 % ophthalmic solution Place 1 drop into the right eye 2 (two) times daily.  . traMADol (ULTRAM) 50 MG tablet Take 1 tablet (50 mg total) by mouth every 6 (six) hours as needed.  . [DISCONTINUED] doxycycline (VIBRAMYCIN) 100 MG capsule Take 1 capsule (100 mg total) by mouth 2 (two) times daily.  . [DISCONTINUED] Vitamin D, Ergocalciferol, (DRISDOL) 50000 UNITS CAPS capsule Take 50,000 Units by mouth once a week.   No facility-administered encounter medications on file as of 09/22/2016.     Review of Systems   General says he feels quite well and has no complaints.  Skin is clean of rashes or itching does have a buttocks abscess which is followed by wound care he has 2 more days of antibiotics remaining he does not  complaining of acute pain here.  Head ears eyes nose mouth and throat does not complaining of any sore throat or visual changes.  Respiratory denies shortness breath or cough.  Cardiac denies chest pain or increased lower extremity edema again he has bilateral BKA's.  GI is not complaining of any abdominal discomfort nausea vomiting diarrhea constipation.  GU he does have end-stage renal dialysis does not complain of comfort.  Musculoskeletal his bilateral BKA's does not complain of joint pain currently or buttocks discomfort.  Neurologic is not complaining dizziness headache or numbness.  I appears to be in goodspirits does not complain of overt anxiety or  Immunization History  Administered Date(s) Administered  . PPD Test 06/21/2016  . Pneumococcal Polysaccharide-23 06/20/2016   Pertinent  Health Maintenance Due  Topic Date Due  . FOOT EXAM  04/25/1965  . OPHTHALMOLOGY EXAM  [birth year]25/1965  . URINE MICROALBUMIN  04[birth year]25/1963  . COLONOSCOPY  04/25/2003  . INFLUENZA VACCINE  11/02/2016  . HEMOGLOBIN A1C  12/27/2016   No flowsheet data found. Functional Status Survey:   Temperature is 98.4 pulse 86 respirations 17 blood pressures 125/71 weight is 232.2   Physical Exam  In general this is a very pleasant middle-aged male in no distress.  His skin is warm and dry-buttocks wound I do not see any drainage bleeding or significant tenderness to palpation this appears to be stable and is followed by a care. I do not see any surrounding erythema or note any odor  Eyes pupils appear reactive to light visual acuity appears grossly intact.  Chest is clear to auscultation there is no labored breathing.  Heart is regular rate and rhythm without murmur gallop or rub.  Abdomen soft somewhat obese nontender with positive bowel sounds.  Rectal-occult blood testing done digitally was negative.  Musculoskeletal does move all extremities 4 is status post bilateral BKA he does  transfer.  Neurologic is grossly intact to speech is clear no lateralizing findings  Psych he is alert and oriented pleasant and appropriate      Labs reviewed:  Recent Labs  06/29/16 0657  09/12/16 0540 09/14/16 0431 09/21/16 0706 09/22/16 1400  NA 140  < > 131* 135 141 137  K 4.6  < > 3.5 3.6  4.0 4.0  CL 102  < > 92* 96* 102 96*  CO2 27  < > 28 30 31  32  GLUCOSE 164*  < > 192* 173* 93 247*  BUN 40*  < > 68* 51* 43* 37*  CREATININE 4.25*  < > 5.13* 4.20* 3.99* 3.54*  CALCIUM 8.4*  < > 8.3* 8.0* 8.2* 8.0*  PHOS 3.4  --  3.3 3.0  --   --   < > = values in this interval not displayed.  Recent Labs  09/12/16 0540 09/14/16 0431 09/21/16 0706  AST  --   --  19  ALT  --   --  13*  ALKPHOS  --   --  51  BILITOT  --   --  0.6  PROT  --   --  5.9*  ALBUMIN 2.2* 2.1* 2.4*    Recent Labs  09/08/16 1206  09/14/16 0431 09/21/16 0706 09/22/16 1400  WBC 11.9*  < > 9.5 8.7 10.4  NEUTROABS 10.2*  --   --  6.5 8.3*  HGB 10.8*  < > 8.7* 7.2* 7.3*  HCT 33.7*  < > 26.8* 23.2* 23.7*  MCV 91.1  < > 92.1 96.7 97.5  PLT 201  < > 227 347 316  < > = values in this interval not displayed. No results found for: TSH Lab Results  Component Value Date   HGBA1C 5.4 06/26/2016   Lab Results  Component Value Date   CHOL 182 08/10/2009   HDL 32 08/10/2009   LDLCALC 40 10/29/2007   TRIG 419 08/10/2009    Significant Diagnostic Results in last 30 days:  No results found.  Assessment/Plan  #1 anemia-hemoglobin appears to be trending down this was discussed with Dr. Abundio Miu have reviewed previous findings which show low iron-it is thought he would benefit from iron therapy and will have dialysis contact about dosing recommendations here.  Also at some point in the future suspect he will need a GI consult.  Clinically he appears to be stable.  Will update labs early next week and obtain iron studies serum iron total iron-binding capacity updated reticulocyte count ferritin  level as well as a B12 and folate  Also will continue to guaiac stools 3 again tests done by me this evening was negative.  Marland Kitchen  #2-history of CHF-she is on high-dose Lasix is dialysis this appears to be stable clinically appears to be doing well.  #3 history of buttocks abscesses finishingantibiotic this appears to be stable as noted above he is followed by wound care as well as surgery  #4 history diabetes-again he is on 30 units Lantus daily at bedtime has somewhat variable blood sugars later in the day would like to see where we stand with His updated hemoglobin A1c 2 months ago appear to be quite satisfactory at 5.4--consider possibly routine short acting insulin later in the day with meals but would like to see the hemoglobin A1c is-this is also somewhat complicated dialysis with risk for variability and hypoglycemia--in fact per chart review it appears he is on a significantly higher dose of Lantus now than he was at home-.  [id number] note greater than 35 minutes spent assessing patient-reviewing his chart-reviewing his labs-discussing his status with Dr. Almeta Monas well as with nursing-and coordinating and formulating a plan of care-of note greater than 50% of time spent coordinating plan of care  with input as noted above        .

## 2016-09-23 DIAGNOSIS — Z23 Encounter for immunization: Secondary | ICD-10-CM | POA: Diagnosis not present

## 2016-09-23 DIAGNOSIS — N186 End stage renal disease: Secondary | ICD-10-CM | POA: Diagnosis not present

## 2016-09-23 DIAGNOSIS — D631 Anemia in chronic kidney disease: Secondary | ICD-10-CM | POA: Diagnosis not present

## 2016-09-23 DIAGNOSIS — D509 Iron deficiency anemia, unspecified: Secondary | ICD-10-CM | POA: Diagnosis not present

## 2016-09-23 DIAGNOSIS — Z992 Dependence on renal dialysis: Secondary | ICD-10-CM | POA: Diagnosis not present

## 2016-09-26 ENCOUNTER — Emergency Department (HOSPITAL_COMMUNITY)
Admission: EM | Admit: 2016-09-26 | Discharge: 2016-09-26 | Disposition: A | Discharge status: Home / Self Care | Payer: Medicare Other | Attending: Emergency Medicine | Admitting: Emergency Medicine

## 2016-09-26 ENCOUNTER — Non-Acute Institutional Stay (SKILLED_NURSING_FACILITY): Payer: Medicare Other | Admitting: Internal Medicine

## 2016-09-26 ENCOUNTER — Other Ambulatory Visit (HOSPITAL_COMMUNITY)
Admission: RE | Admit: 2016-09-26 | Discharge: 2016-09-26 | Disposition: A | Discharge status: Home / Self Care | Payer: Medicare Other | Source: Skilled Nursing Facility | Attending: Internal Medicine | Admitting: Internal Medicine

## 2016-09-26 ENCOUNTER — Encounter (HOSPITAL_COMMUNITY): Payer: Self-pay | Admitting: Emergency Medicine

## 2016-09-26 ENCOUNTER — Inpatient Hospital Stay
Admission: RE | Admit: 2016-09-26 | Discharge: 2016-11-03 | Disposition: A | Discharge status: Home / Self Care | Payer: Medicare Other | Source: Ambulatory Visit | Attending: Internal Medicine | Admitting: Internal Medicine

## 2016-09-26 ENCOUNTER — Encounter: Payer: Self-pay | Admitting: Internal Medicine

## 2016-09-26 DIAGNOSIS — Z79899 Other long term (current) drug therapy: Secondary | ICD-10-CM | POA: Insufficient documentation

## 2016-09-26 DIAGNOSIS — Z992 Dependence on renal dialysis: Secondary | ICD-10-CM | POA: Diagnosis not present

## 2016-09-26 DIAGNOSIS — Z7902 Long term (current) use of antithrombotics/antiplatelets: Secondary | ICD-10-CM | POA: Diagnosis not present

## 2016-09-26 DIAGNOSIS — L02215 Cutaneous abscess of perineum: Secondary | ICD-10-CM

## 2016-09-26 DIAGNOSIS — L0231 Cutaneous abscess of buttock: Secondary | ICD-10-CM

## 2016-09-26 DIAGNOSIS — I5042 Chronic combined systolic (congestive) and diastolic (congestive) heart failure: Secondary | ICD-10-CM | POA: Diagnosis not present

## 2016-09-26 DIAGNOSIS — N186 End stage renal disease: Secondary | ICD-10-CM

## 2016-09-26 DIAGNOSIS — Z7982 Long term (current) use of aspirin: Secondary | ICD-10-CM | POA: Diagnosis not present

## 2016-09-26 DIAGNOSIS — D631 Anemia in chronic kidney disease: Secondary | ICD-10-CM | POA: Diagnosis not present

## 2016-09-26 DIAGNOSIS — E1151 Type 2 diabetes mellitus with diabetic peripheral angiopathy without gangrene: Secondary | ICD-10-CM | POA: Insufficient documentation

## 2016-09-26 DIAGNOSIS — Z87891 Personal history of nicotine dependence: Secondary | ICD-10-CM | POA: Insufficient documentation

## 2016-09-26 DIAGNOSIS — I132 Hypertensive heart and chronic kidney disease with heart failure and with stage 5 chronic kidney disease, or end stage renal disease: Secondary | ICD-10-CM | POA: Insufficient documentation

## 2016-09-26 DIAGNOSIS — D509 Iron deficiency anemia, unspecified: Secondary | ICD-10-CM | POA: Diagnosis not present

## 2016-09-26 DIAGNOSIS — Z23 Encounter for immunization: Secondary | ICD-10-CM | POA: Diagnosis not present

## 2016-09-26 LAB — CBC WITH DIFFERENTIAL/PLATELET
BASOS PCT: 0 %
Basophils Absolute: 0 10*3/uL (ref 0.0–0.1)
EOS ABS: 0.2 10*3/uL (ref 0.0–0.7)
Eosinophils Relative: 4 %
HCT: 23.4 % — ABNORMAL LOW (ref 39.0–52.0)
Hemoglobin: 7.2 g/dL — ABNORMAL LOW (ref 13.0–17.0)
Lymphocytes Relative: 17 %
Lymphs Abs: 0.9 10*3/uL (ref 0.7–4.0)
MCH: 30.3 pg (ref 26.0–34.0)
MCHC: 30.8 g/dL (ref 30.0–36.0)
MCV: 98.3 fL (ref 78.0–100.0)
MONO ABS: 0.5 10*3/uL (ref 0.1–1.0)
MONOS PCT: 10 %
Neutro Abs: 4 10*3/uL (ref 1.7–7.7)
Neutrophils Relative %: 69 %
Platelets: 320 10*3/uL (ref 150–400)
RBC: 2.38 MIL/uL — ABNORMAL LOW (ref 4.22–5.81)
RDW: 15.4 % (ref 11.5–15.5)
WBC: 5.7 10*3/uL (ref 4.0–10.5)

## 2016-09-26 LAB — RETICULOCYTES
RBC.: 2.38 MIL/uL — ABNORMAL LOW (ref 4.22–5.81)
RETIC CT PCT: 8 % — AB (ref 0.4–3.1)
Retic Count, Absolute: 190.4 10*3/uL — ABNORMAL HIGH (ref 19.0–186.0)

## 2016-09-26 LAB — IRON AND TIBC
Iron: 57 ug/dL (ref 45–182)
SATURATION RATIOS: 21 % (ref 17.9–39.5)
TIBC: 266 ug/dL (ref 250–450)
UIBC: 209 ug/dL

## 2016-09-26 LAB — FERRITIN: FERRITIN: 844 ng/mL — AB (ref 24–336)

## 2016-09-26 LAB — FOLATE: Folate: 17.7 ng/mL (ref >5.9)

## 2016-09-26 LAB — VITAMIN B12: Vitamin B-12: 454 pg/mL (ref 180–914)

## 2016-09-26 MED ORDER — DOXYCYCLINE HYCLATE 100 MG PO TABS
100.0000 mg | ORAL_TABLET | Freq: Once | ORAL | Status: AC
  Administered 2016-09-26: 100 mg via ORAL
  Filled 2016-09-26: qty 1

## 2016-09-26 MED ORDER — ACETAMINOPHEN 325 MG PO TABS
650.0000 mg | ORAL_TABLET | Freq: Once | ORAL | Status: AC
  Administered 2016-09-26: 650 mg via ORAL
  Filled 2016-09-26: qty 2

## 2016-09-26 MED ORDER — DOXYCYCLINE HYCLATE 100 MG PO CAPS: 100.0000 mg | ORAL_CAPSULE | Freq: Two times a day (BID) | ORAL | 0 refills | Status: DC

## 2016-09-26 MED ORDER — LIDOCAINE-EPINEPHRINE 2 %-1:200000 IJ SOLN
10.0000 mL | Freq: Once | INTRAMUSCULAR | Status: AC
Start: 2016-09-26 — End: 2016-09-26
  Administered 2016-09-26: 20 mL
  Filled 2016-09-26: qty 20

## 2016-09-26 NOTE — Progress Notes (Addendum)
Location:   [facility] Room Number: 126/P Place of Service:  SNF [phone]) Provider:  Yetta Numbers, MD  Patient Care Team: [name], MD as PCP - General (Internal Medicine) Liana Gerold, MD as Consulting Physician (Nephrology) Fran Lowes, MD as Referring Physician (Nephrology)  Extended Emergency Contact Information Primary Emergency Contact: Marinez,Mike Address: [address]          [place], [place] [address] [place] Phone: [phone] Relation: Brother Secondary Emergency Contact: Pangilinan,Roger Address: [address]          [place], [place] [address] [place] Phone: [phone] Mobile Phone: [phone] Relation: Brother  Code Status:  Full Code Goals of care: Advanced Directive information Advanced Directives 09/26/2016  Does Patient Have a Medical Advance Directive? Yes  Type of Advance Directive (No Data)  Does patient want to make changes to medical advance directive? No - Patient declined  Would patient like information on creating a medical advance directive? No - Patient declined     Chief Complaint  Patient presents with  . Acute Visit    Elevated BS and Boil by Scrotum    HPI:  Pt is a 61 y.o. male seen today for an acute visit for Another Wound seen on the weekend near his Scrotal Area. Also Worsening drainage from the original wound. And also his BS are running more then 250 throughout the day.  Patient has h/o  ESRD on Dialysis MWF, Diabetes Mellitus , Bilateral BKA, AICD CHF with EF of 35-40% in 03/18, Hypertension, Hyperlipidemia, Anemia And PVD, Proliferative Diabetic retinopathy  Patient did not have any complain but was noticed to have new Boil in the perianal area below his scrotum. There is some redness and swelling around the boil. He also has worsening swelling and drainage from is previous wound. His last day of antibiotics were on 06/23. He denies  any pain in that area. No fever or chills. The Nurses have noticed Odor from his drainage too. Past Medical History:  Diagnosis Date  . AICD (automatic cardioverter/defibrillator) present   . Anemia   . Arthritis   . Cerebrovascular disease   . CHF (congestive heart failure) ([place])   . Chronic kidney disease    ESRD, MWF HD  . Diabetes mellitus    type II  . History of TIAs   . Hyperlipidemia   . Hypertension   . Nonischemic cardiomyopathy ([place])   . PVD (peripheral vascular disease) ([place])    s/p B BKA  . Shortness of breath dyspnea    with exertion   . Skin disease    Rare  . Stroke [facility])    "light stroke"  . Tobacco abuse    Past Surgical History:  Procedure Laterality Date  . A/V SHUNTOGRAM Left 06/24/2016   Procedure: A/V Fistulagram;  Surgeon: Elam Dutch, MD;  Location: [facility];  Service: Cardiovascular;  Laterality: Left;  . AV FISTULA PLACEMENT Left 04/08/2015   Procedure: Creation of Left arm BRACHIOCEPHALIC ARTERIOVENOUS  FISTULA ;  Surgeon: Mal Misty, MD;  Location: [facility];  Service: Vascular;  Laterality: Left;  . CARDIAC DEFIBRILLATOR PLACEMENT    . CARDIAC DEFIBRILLATOR PLACEMENT    . CATARACT EXTRACTION W/PHACO Right 03/17/2015   Procedure: CATARACT EXTRACTION PHACO AND INTRAOCULAR LENS PLACEMENT (IOC);  Surgeon: Rutherford Guys, MD;  Location: [facility];  Service: Ophthalmology;  Laterality: Right;  CDE: 6.59  . EYE SURGERY Right  Cataract  . LEG AMPUTATION BELOW KNEE     bilateral    Allergies  Allergen Reactions  . Acetazolamide Other (See Comments)    Jittery odd feeling. (hyper feeling)  . Contrast Media [Iodinated Diagnostic Agents] Hives    In the 80's   . Other Other (See Comments)    Transfer Dye---"Makes me tired"    Outpatient Encounter Prescriptions as of 09/26/2016  Medication Sig  . acetaminophen (TYLENOL) 500 MG tablet Take 1,000 mg by mouth daily as needed for mild pain or headache.   Marland Kitchen aspirin 81 MG tablet Take 81 mg by  mouth daily.    . brimonidine (ALPHAGAN) 0.2 % ophthalmic solution Place 1 drop into the right eye every 8 (eight) hours.  . calcium acetate (PHOSLO) 667 MG capsule Take 667 mg by mouth daily. With largest meal of the day  . camphor-menthol (SARNA) lotion Apply 1 application topically every 8 (eight) hours as needed for itching.  . carvedilol (COREG) 25 MG tablet Take 1 tablet (25 mg total) by mouth 2 (two) times daily.  . cloNIDine (CATAPRES) 0.1 MG tablet Take 1 tablet (0.1 mg total) by mouth at bedtime.  . clopidogrel (PLAVIX) 75 MG tablet Take 75 mg by mouth daily.    . fenofibrate 160 MG tablet Take 160 mg by mouth daily.  . folic acid (FOLVITE) 1 MG tablet Take 1 mg by mouth daily.  . furosemide (LASIX) 20 MG tablet Take 80 mg by mouth 2 (two) times daily.   . hydrOXYzine (ATARAX/VISTARIL) 25 MG tablet Take 1 tablet (25 mg total) by mouth every 8 (eight) hours as needed for itching.  . Insulin Glargine (LANTUS SOLOSTAR) 100 UNIT/ML Solostar Pen Inject 30 Units into the skin at bedtime.  Marland Kitchen latanoprost (XALATAN) 0.005 % ophthalmic solution Place 1 drop into the right eye at bedtime.  . Nutritional Supplements (NEPRO/CARBSTEADY PO) Take 237 mLs by mouth daily.  Marland Kitchen ofloxacin (OCUFLOX) 0.3 % ophthalmic solution Place 1 drop into the right eye 2 (two) times daily.  Marland Kitchen oxyCODONE (OXY IR/ROXICODONE) 5 MG immediate release tablet Take 1 tablet (5 mg total) by mouth at bedtime as needed for moderate pain.  . prednisoLONE acetate (PRED FORTE) 1 % ophthalmic suspension Place 1 drop into the right eye every 2 (two) hours while awake.  . rosuvastatin (CRESTOR) 20 MG tablet Take 20 mg by mouth daily.  . timolol (TIMOPTIC) 0.5 % ophthalmic solution Place 1 drop into the right eye 2 (two) times daily.  . traMADol (ULTRAM) 50 MG tablet Take 1 tablet (50 mg total) by mouth every 6 (six) hours as needed.  . [DISCONTINUED] amoxicillin-clavulanate (AUGMENTIN) 500-125 MG tablet Take 1 tablet (500 mg total) by mouth  every evening.  . [DISCONTINUED] Nutritional Supplements (FEEDING SUPPLEMENT, NEPRO CARB STEADY,) LIQD Take 237 mLs by mouth 3 (three) times daily as needed (Supplement). (Patient taking differently: Take 237 mLs by mouth daily. )   No facility-administered encounter medications on file as of 09/26/2016.      Review of Systems  Review of Systems  Constitutional: Negative for activity change, appetite change, chills, diaphoresis, fatigue and fever.  HENT: Negative for mouth sores, postnasal drip, rhinorrhea, sinus pain and sore throat.   Respiratory: Negative for apnea, cough, chest tightness, shortness of breath and wheezing.   Cardiovascular: Negative for chest pain, palpitations and leg swelling.  Gastrointestinal: Negative for abdominal distention, abdominal pain, constipation, diarrhea, nausea and vomiting.  Genitourinary: Negative for dysuria and frequency.  Musculoskeletal: Negative for  arthralgias, joint swelling and myalgias.  Skin: Negative for rash.  Neurological: Negative for dizziness, syncope, weakness, light-headedness and numbness.  Psychiatric/Behavioral: Negative for behavioral problems, confusion and sleep disturbance.     Immunization History  Administered Date(s) Administered  . PPD Test 06/21/2016  . Pneumococcal Polysaccharide-23 06/20/2016   Pertinent  Health Maintenance Due  Topic Date Due  . FOOT EXAM  04/25/1963  . OPHTHALMOLOGY EXAM  04/25/1963  . URINE MICROALBUMIN  04/25/1963  . COLONOSCOPY  04/25/2003  . INFLUENZA VACCINE  11/02/2016  . HEMOGLOBIN A1C  12/27/2016   No flowsheet data found. Functional Status Survey:    Vitals:   09/26/16 0922  BP: 140/63  Pulse: 78  Resp: 20  Temp: 98.2 F (36.8 C)  TempSrc: Oral   There is no height or weight on file to calculate BMI. Physical Exam  Constitutional: He is oriented to person, place, and time. He appears well-developed and well-nourished.  HENT:  Head: Normocephalic.  Mouth/Throat:  Oropharynx is clear and moist.  Eyes: Pupils are equal, round, and reactive to light.  Neck: Neck supple.  Cardiovascular: Normal rate and regular rhythm.   No murmur heard. Pulmonary/Chest: Effort normal and breath sounds normal. No respiratory distress. He has no wheezes.  Abdominal: Soft. Bowel sounds are normal. He exhibits no distension. There is no tenderness. There is no rebound.  Musculoskeletal: He exhibits no edema.  Neurological: He is alert and oriented to person, place, and time.  Skin:  Has swelling around his Previous Wound which is draining White Purulent Pus . The New wound also is draining white Mucopurulent drainage.    Psychiatric: He has a normal mood and affect. His behavior is normal.    Labs reviewed:  Recent Labs  06/29/16 0657  09/12/16 0540 09/14/16 0431 09/21/16 0706 09/22/16 1400  NA 140  < > 131* 135 141 137  K 4.6  < > 3.5 3.6 4.0 4.0  CL 102  < > 92* 96* 102 96*  CO2 27  < > 28 30 31  32  GLUCOSE 164*  < > 192* 173* 93 247*  BUN 40*  < > 68* 51* 43* 37*  CREATININE 4.25*  < > 5.13* 4.20* 3.99* 3.54*  CALCIUM 8.4*  < > 8.3* 8.0* 8.2* 8.0*  PHOS 3.4  --  3.3 3.0  --   --   < > = values in this interval not displayed.  Recent Labs  09/12/16 0540 09/14/16 0431 09/21/16 0706  AST  --   --  19  ALT  --   --  13*  ALKPHOS  --   --  51  BILITOT  --   --  0.6  PROT  --   --  5.9*  ALBUMIN 2.2* 2.1* 2.4*    Recent Labs  09/21/16 0706 09/22/16 1400 09/26/16 0445  WBC 8.7 10.4 5.7  NEUTROABS 6.5 8.3* 4.0  HGB 7.2* 7.3* 7.2*  HCT 23.2* 23.7* 23.4*  MCV 96.7 97.5 98.3  PLT 347 316 320   No results found for: TSH Lab Results  Component Value Date   HGBA1C 5.4 06/26/2016   Lab Results  Component Value Date   CHOL 182 08/10/2009   HDL 32 08/10/2009   LDLCALC 40 10/29/2007   TRIG 419 08/10/2009    Significant Diagnostic Results in last 30 days:  No results found.  Assessment/Plan  Abscess of buttock and Peri anal  area Worsening since this weekend. Will restart him on Augmentin and Re consult  Dr Arnoldo Morale. D/W Wound care Nurse . Hopefully He can do I & D in facility otherwise will need to send him to ED.  DM (diabetes mellitus), type 2  BS running more then 250 in the facility. Can be due to infection. Will increase his Lantus to 35 units from 30 units  Continue Accu Check and Sliding Scale.with Humalog   Anemia in chronic kidney disease, on chronic dialysis  Patient did have low iron Loads with Low ferritin and TSAT. in the 03/18. D/W Dialysis for IV iron and need for [facility] consult as out patient. He has been Guaiac Negative an facility. His Hgb is stable now. Repeat Iron Studies are pending.   ESRD (end stage renal disease)  He is stable and tolerating Dialysis.    Family/ staff Communication:   Labs/tests ordered:

## 2016-09-26 NOTE — ED Triage Notes (Signed)
Pt sent for evaluation of multiple abscesses to groin and buttocks.  Worsening since last visit.

## 2016-09-26 NOTE — Discharge Instructions (Signed)
We were able to drain a large amount of pus from his perineum. This wound will continue to drain. Prescription for antibiotic for 10 more days. Try to rinse perineum daily with shower water. Close attention to his glucose level is important.

## 2016-09-26 NOTE — ED Provider Notes (Signed)
[facility] DEPT Provider Note   CSN: [id number] Arrival date & time: 09/26/16  1621     History   Chief Complaint Chief Complaint  Patient presents with  . Abscess    HPI [name] is a 61 y.o. male.  Patient evaluated on 09/08/16 for an abscess on his right medial buttocks. Incision and drainage was performed and patient was admitted to the hospital. Today he presents with a persistent abscess in his perineal area. Questionable low-grade fever. I do not believe he is on antibiotics at this time. He has long-standing diabetes, end-stage renal disease, and bilateral leg amputations. Severity of symptoms is moderate.      Past Medical History:  Diagnosis Date  . AICD (automatic cardioverter/defibrillator) present   . Anemia   . Arthritis   . Cerebrovascular disease   . CHF (congestive heart failure) ([place])   . Chronic kidney disease    ESRD, MWF HD  . Diabetes mellitus    type II  . History of TIAs   . Hyperlipidemia   . Hypertension   . Nonischemic cardiomyopathy ([place])   . PVD (peripheral vascular disease) ([place])    s/p B BKA  . Shortness of breath dyspnea    with exertion   . Skin disease    Rare  . Stroke [facility])    "light stroke"  . Tobacco abuse     Patient Active Problem List   Diagnosis Date Noted  . Anemia 09/15/2016  . Abscess 09/09/2016  . Abscess of buttock, right 09/08/2016  . ESRD (end stage renal disease) ([place]) 09/08/2016  . S/P bilateral BKA (below knee amputation) ([facility]) 09/08/2016  . Anemia due to chronic kidney disease 09/08/2016  . Chronic combined systolic (congestive) and diastolic (congestive) heart failure ([place]) 06/20/2016  . Acute hypoxemic respiratory failure ([place]) 06/19/2016  . Volume overload 06/19/2016  . AKI (acute kidney injury) ([place]) 06/19/2016  . DM (diabetes mellitus), type 2 with peripheral vascular complications ([place]) 87/68/1157  . SOB (shortness of breath)   . ACHILLES BURSITIS OR TENDINITIS 09/14/2009  . PLANTAR  FACIITIS 09/14/2009  . TIA 09/07/2009  . Peripheral vascular disease ([place]) 01/07/2009  . Hyperlipidemia 08/27/2008  . Essential hypertension 08/27/2008    Past Surgical History:  Procedure Laterality Date  . A/V SHUNTOGRAM Left 06/24/2016   Procedure: A/V Fistulagram;  Surgeon: Elam Dutch, MD;  Location: [facility];  Service: Cardiovascular;  Laterality: Left;  . AV FISTULA PLACEMENT Left 04/08/2015   Procedure: Creation of Left arm BRACHIOCEPHALIC ARTERIOVENOUS  FISTULA ;  Surgeon: Mal Misty, MD;  Location: [facility];  Service: Vascular;  Laterality: Left;  . CARDIAC DEFIBRILLATOR PLACEMENT    . CARDIAC DEFIBRILLATOR PLACEMENT    . CATARACT EXTRACTION W/PHACO Right 03/17/2015   Procedure: CATARACT EXTRACTION PHACO AND INTRAOCULAR LENS PLACEMENT (IOC);  Surgeon: Rutherford Guys, MD;  Location: [facility];  Service: Ophthalmology;  Laterality: Right;  CDE: 6.59  . EYE SURGERY Right    Cataract  . LEG AMPUTATION BELOW KNEE     bilateral       Home Medications    Prior to Admission medications   Medication Sig Start Date End Date Taking? Authorizing Provider  acetaminophen (TYLENOL) 500 MG tablet Take 1,000 mg by mouth daily as needed for mild pain or headache.    Yes [provider]  amoxicillin-clavulanate (AUGMENTIN) 500-125 MG tablet Take 1 tablet by mouth daily. 10 day course starting on   Yes [provider]  aspirin  81 MG tablet Take 81 mg by mouth daily.     Yes [provider]  brimonidine (ALPHAGAN) 0.2 % ophthalmic solution Place 1 drop into the right eye every 8 (eight) hours. 07/12/16  Yes [provider]  calcium acetate (PHOSLO) 667 MG capsule Take 667 mg by mouth daily with supper. With largest meal of the day 03/25/15  Yes [provider]  camphor-menthol (SARNA) lotion Apply 1 application topically every 8 (eight) hours as needed for itching. 09/12/16  Yes Orvan Falconer, MD  carvedilol (COREG) 25 MG tablet Take 1 tablet  (25 mg total) by mouth 2 (two) times daily. 03/02/11  Yes Evans Lance, MD  cloNIDine (CATAPRES) 0.1 MG tablet Take 1 tablet (0.1 mg total) by mouth at bedtime. 06/28/16  Yes Domenic Polite, MD  clopidogrel (PLAVIX) 75 MG tablet Take 75 mg by mouth daily.     Yes [provider]  fenofibrate 160 MG tablet Take 160 mg by mouth daily. 03/09/15  Yes [provider]  folic acid (FOLVITE) 1 MG tablet Take 1 mg by mouth daily.   Yes [provider]  furosemide (LASIX) 80 MG tablet Take 80 mg by mouth 2 (two) times daily.    Yes [provider]  hydrOXYzine (ATARAX/VISTARIL) 25 MG tablet Take 1 tablet (25 mg total) by mouth every 8 (eight) hours as needed for itching. 09/12/16  Yes Orvan Falconer, MD  Insulin Glargine (LANTUS SOLOSTAR) 100 UNIT/ML Solostar Pen Inject 30 Units into the skin at bedtime. Patient taking differently: Inject 35 Units into the skin at bedtime.  09/14/16  Yes Memon, Jolaine Artist, MD  latanoprost (XALATAN) 0.005 % ophthalmic solution Place 1 drop into the right eye at bedtime. 07/12/16  Yes [provider]  Nutritional Supplements (NEPRO/CARBSTEADY PO) Take 237 mLs by mouth daily.   Yes [provider]  ofloxacin (OCUFLOX) 0.3 % ophthalmic solution Place 1 drop into the right eye 2 (two) times daily.   Yes [provider]  prednisoLONE acetate (PRED FORTE) 1 % ophthalmic suspension Place 1 drop into the right eye every 2 (two) hours while awake.   Yes [provider]  rosuvastatin (CRESTOR) 20 MG tablet Take 20 mg by mouth daily. 02/24/15  Yes [provider]  timolol (TIMOPTIC) 0.5 % ophthalmic solution Place 1 drop into the right eye 2 (two) times daily. 07/12/16  Yes [provider]  traMADol (ULTRAM) 50 MG tablet Take 1 tablet (50 mg total) by mouth every 6 (six) hours as needed. 09/15/16  Yes Lauree Chandler, NP  doxycycline (VIBRAMYCIN) 100 MG capsule Take 1 capsule (100 mg total) by mouth 2 (two)  times daily. 09/26/16   Nat Christen, MD  oxyCODONE (OXY IR/ROXICODONE) 5 MG immediate release tablet Take 1 tablet (5 mg total) by mouth at bedtime as needed for moderate pain. 09/15/16   Lauree Chandler, NP    Family History Family History  Problem Relation Age of Onset  . Diabetes Father   . Stroke Father   . Diabetes Brother   . Diabetes Brother   . Diabetes Brother   . Diabetes Brother   . Heart failure Mother   . Diabetes Mother   . Diabetes Other   . Coronary artery disease Other     Social History Social History  Substance Use Topics  . Smoking status: Former Smoker    Packs/day: 1.00    Years: 20.00    Quit date: 03/12/1998  . Smokeless tobacco: Never Used  .  Alcohol use 0.0 oz/week     Comment: occasional     Allergies   Acetazolamide; Contrast media [iodinated diagnostic agents]; and Other   Review of Systems Review of Systems  All other systems reviewed and are negative.    Physical Exam Updated Vital Signs BP (!) 158/55   Pulse 84   Temp 99.9 F (37.7 C) (Oral)   Resp 20   Wt 105.2 kg (232 lb)   SpO2 94%   BMI 31.46 kg/m   Physical Exam  Constitutional: He is oriented to person, place, and time.  Nontoxic-appearing.  HENT:  Head: Normocephalic and atraumatic.  Eyes: Conjunctivae are normal.  Neck: Neck supple.  Cardiovascular: Normal rate and regular rhythm.   Pulmonary/Chest: Effort normal and breath sounds normal.  Abdominal: Soft. Bowel sounds are normal.  Musculoskeletal:  Bilateral lower extremity amputations  Neurological: He is alert and oriented to person, place, and time.  Skin:  Obvious abscess in the right midline perineal area approximately 4 cm in diameter.  Psychiatric: He has a normal mood and affect. His behavior is normal.  Nursing note and vitals reviewed.    ED Treatments / Results  Labs (all labs ordered are listed, but only abnormal results are displayed) Labs Reviewed - No data to display  EKG  EKG  Interpretation None       Radiology No results found.  Procedures .Marland KitchenIncision and Drainage Date/Time: 09/26/2016 6:15 PM Performed by: Nat Christen Authorized by: Nat Christen   Consent:    Consent obtained:  Verbal   Consent given by:  Patient   Risks discussed:  Bleeding, infection and pain Location:    Type:  Abscess   Size:  4.0 cm   Location: perineum. Pre-procedure details:    Skin preparation:  Betadine Anesthesia (see MAR for exact dosages):    Anesthesia method:  Local infiltration   Local anesthetic:  Lidocaine 2% WITH epi Procedure type:    Complexity:  Complex Procedure details:    Needle aspiration: no     Incision types:  Stab incision   Incision depth:  Dermal   Scalpel blade:  11   Wound management:  Probed and deloculated, irrigated with saline and extensive cleaning   Drainage:  Purulent   Drainage amount:  Copious   Wound treatment:  Wound left open   Packing materials:  None Post-procedure details:    Patient tolerance of procedure:  Tolerated well, no immediate complications   (including critical care time)  Medications Ordered in ED Medications  doxycycline (VIBRA-TABS) tablet 100 mg (not administered)  lidocaine-EPINEPHrine (XYLOCAINE W/EPI) 2 %-1:200000 (PF) injection 10 mL (20 mLs Infiltration Given by Other 09/26/16 1815)  acetaminophen (TYLENOL) tablet 650 mg (650 mg Oral Given 09/26/16 1847)     Initial Impression / Assessment and Plan / ED Course  I have reviewed the triage vital signs and the nursing notes.  Pertinent labs & imaging results that were available during my care of the patient were reviewed by me and considered in my medical decision making (see chart for details).     Patient has an obvious perineal abscess. Incision and drainage was performed by examiner. He is presently on Augmentin. Will add doxycycline for MRSA coverage.  Final Clinical Impressions(s) / ED Diagnoses   Final diagnoses:  Abscess, perineum     New Prescriptions New Prescriptions   DOXYCYCLINE (VIBRAMYCIN) 100 MG CAPSULE    Take 1 capsule (100 mg total) by mouth 2 (two) times daily.  Nat Christen, MD 09/26/16 Drema Halon

## 2016-09-27 ENCOUNTER — Encounter: Payer: Self-pay | Admitting: Internal Medicine

## 2016-09-27 ENCOUNTER — Non-Acute Institutional Stay (SKILLED_NURSING_FACILITY): Payer: Medicare Other | Admitting: Internal Medicine

## 2016-09-27 DIAGNOSIS — E1151 Type 2 diabetes mellitus with diabetic peripheral angiopathy without gangrene: Secondary | ICD-10-CM

## 2016-09-27 DIAGNOSIS — N186 End stage renal disease: Secondary | ICD-10-CM

## 2016-09-27 DIAGNOSIS — L0291 Cutaneous abscess, unspecified: Secondary | ICD-10-CM

## 2016-09-27 DIAGNOSIS — I10 Essential (primary) hypertension: Secondary | ICD-10-CM

## 2016-09-27 DIAGNOSIS — Z992 Dependence on renal dialysis: Secondary | ICD-10-CM

## 2016-09-27 DIAGNOSIS — D631 Anemia in chronic kidney disease: Secondary | ICD-10-CM

## 2016-09-27 NOTE — Progress Notes (Signed)
Location:   [facility] Room Number: 123/P Place of Service:  SNF [phone]) Provider:  Lucretia Field, MD  Patient Care Team: [name], MD as PCP - General (Internal Medicine) Liana Gerold, MD as Consulting Physician (Nephrology) Fran Lowes, MD as Referring Physician (Nephrology)  Extended Emergency Contact Information Primary Emergency Contact: Alpern,Mike Address: [address]          [place], [place] [address] [place] Phone: [phone] Relation: Brother Secondary Emergency Contact: Naji,Roger Address: [address]          [place],  [address] [place] Phone: [phone] Mobile Phone: [phone] Relation: Brother  Code Status:  Full Code Goals of care: Advanced Directive information Advanced Directives 09/27/2016  Does Patient Have a Medical Advance Directive? Yes  Type of Advance Directive Out of facility DNR (pink MOST or yellow form)  Does patient want to make changes to medical advance directive? No - Patient declined  Would patient like information on creating a medical advance directive? No - Patient declined     Chief Complaint  Patient presents with  . Acute Visit    F/u ED  ED visit for evaluation of buttocks abscess in perineal lesion  HPI:  Pt is a 61 y.o. male seen today for an acute visit for follow-up of ER visit for a buttocks abscess and also perineal boil.  Patient has h/o ESRD on Dialysis MWF, Diabetes Mellitus , Bilateral BKA, AICD CHF with EF of 35-40% in 03/18, Hypertension, Hyperlipidemia, Anemia And PVD, Proliferative Diabetic retinopathy  Patient does have a history of a buttocks abscess has just completed antibiotic for that-he was also noted have a new boil in the perianal area--  There was also an odor noted with drainage  Was also noted to have some elevated blood sugars-thought possibly secondary to infection his Lantus was increased  yesterday to 35 units up from 30 units is also on a Humalog sliding scale CBGs today appear improved at 192   AM and then it was 128 at noon--   Currently he is sitting in his room he appears comfortable denies at this time fevers or chills vital signs are stable he does not have an elevated temperature.  In the ER yesterday he did receive an incision and drainage and has been discharged on Augmentin as well as doxycycline which are to run through the middle of next week.  His surgeon Dr. Arnoldo Morale also has been contacted--   Past Surgical History:  Procedure Laterality Date  . A/V SHUNTOGRAM Left 06/24/2016   Procedure: A/V Fistulagram;  Surgeon: Elam Dutch, MD;  Location: [facility];  Service: Cardiovascular;  Laterality: Left;  . AV FISTULA PLACEMENT Left 04/08/2015   Procedure: Creation of Left arm BRACHIOCEPHALIC ARTERIOVENOUS  FISTULA ;  Surgeon: Mal Misty, MD;  Location: [facility];  Service: Vascular;  Laterality: Left;  . CARDIAC DEFIBRILLATOR PLACEMENT    . CARDIAC DEFIBRILLATOR PLACEMENT    . CATARACT EXTRACTION W/PHACO Right 03/17/2015   Procedure: CATARACT EXTRACTION PHACO AND INTRAOCULAR LENS PLACEMENT (IOC);  Surgeon: Rutherford Guys, MD;  Location: [facility];  Service: Ophthalmology;  Laterality: Right;  CDE: 6.59  . EYE SURGERY Right    Cataract  . LEG AMPUTATION BELOW KNEE     bilateral    Allergies  Allergen Reactions  . Acetazolamide Other (See Comments)    Jittery odd feeling. (hyper feeling)  . Contrast  Media [Iodinated Diagnostic Agents] Hives    In the 80's   . Other Other (See Comments)    Transfer Dye---"Makes me tired"    Outpatient Encounter Prescriptions as of 09/27/2016  Medication Sig  . acetaminophen (TYLENOL) 500 MG tablet Take 1,000 mg by mouth every 8 (eight) hours as needed for mild pain or headache.   Marland Kitchen amoxicillin-clavulanate (AUGMENTIN) 500-125 MG tablet Take 1 tablet by mouth daily. 10 day course starting on  . aspirin 81 MG tablet Take  81 mg by mouth daily.    . brimonidine (ALPHAGAN) 0.2 % ophthalmic solution Place 1 drop into the right eye every 8 (eight) hours.  . calcium acetate (PHOSLO) 667 MG capsule Take 667 mg by mouth daily with supper. With largest meal of the day  . camphor-menthol (SARNA) lotion Apply 1 application topically every 8 (eight) hours as needed for itching.  . carvedilol (COREG) 25 MG tablet Take 1 tablet (25 mg total) by mouth 2 (two) times daily.  . cloNIDine (CATAPRES) 0.1 MG tablet Take 1 tablet (0.1 mg total) by mouth at bedtime.  . clopidogrel (PLAVIX) 75 MG tablet Take 75 mg by mouth daily.    Marland Kitchen doxycycline (VIBRAMYCIN) 100 MG capsule Take 1 capsule (100 mg total) by mouth 2 (two) times daily.  . fenofibrate 160 MG tablet Take 160 mg by mouth daily.  . folic acid (FOLVITE) 1 MG tablet Take 1 mg by mouth daily.  . furosemide (LASIX) 80 MG tablet Take 80 mg by mouth daily.   . hydrOXYzine (ATARAX/VISTARIL) 25 MG tablet Take 1 tablet (25 mg total) by mouth every 8 (eight) hours as needed for itching.  . Insulin Glargine (LANTUS SOLOSTAR) 100 UNIT/ML Solostar Pen Inject 35 Units into the skin daily at 10 pm.  . insulin lispro (HUMALOG KWIKPEN) 100 UNIT/ML KiwkPen Give per sliding scale  . latanoprost (XALATAN) 0.005 % ophthalmic solution Place 1 drop into the right eye at bedtime.  . Nutritional Supplements (NEPRO/CARBSTEADY PO) Take 237 mLs by mouth daily.  Marland Kitchen ofloxacin (OCUFLOX) 0.3 % ophthalmic solution Place 1 drop into the right eye 2 (two) times daily.  Marland Kitchen oxyCODONE (OXY IR/ROXICODONE) 5 MG immediate release tablet Take 1 tablet (5 mg total) by mouth at bedtime as needed for moderate pain.  . prednisoLONE acetate (PRED FORTE) 1 % ophthalmic suspension Place 1 drop into the right eye every 2 (two) hours while awake.  . rosuvastatin (CRESTOR) 20 MG tablet Take 20 mg by mouth daily.  . timolol (TIMOPTIC) 0.5 % ophthalmic solution Place 1 drop into the right eye 2 (two) times daily.  . traMADol  (ULTRAM) 50 MG tablet Take 1 tablet (50 mg total) by mouth every 6 (six) hours as needed.  . [DISCONTINUED] Calcium Carbonate 500 MG CHEW Take 1 tablet by mouth every 6 hours prn  . [DISCONTINUED] ferrous sulfate (KP FERROUS SULFATE) 325 (65 FE) MG tablet Take 325 mg by mouth daily with breakfast.  . [DISCONTINUED] Insulin Glargine (LANTUS SOLOSTAR) 100 UNIT/ML Solostar Pen Inject 30 Units into the skin at bedtime.  . [DISCONTINUED] levothyroxine (SYNTHROID, LEVOTHROID) 100 MCG tablet Take 100 mcg by mouth daily before breakfast.  . [DISCONTINUED] Melatonin 3 MG TABS Take 3 mg by mouth at bedtime.  . [DISCONTINUED] Multiple Vitamins-Minerals (MULTIVITAMIN WITH MINERALS) tablet Take 1 tablet by mouth daily.  . [DISCONTINUED] nystatin (NYSTATIN) powder Apply 100,000 g topically 3 (three) times daily.   No facility-administered encounter medications on file as of 09/27/2016.  Review of Systems    General says he feels quite well and has no complaints.  Skin has the perianal lesion as well as buttocks abscess as noted above does not really complaining of acute pain in this regards.  Head ears eyes nose mouth and throat does not complaining of any sore throat or visual changes.  Respiratory denies shortness breath or cough.  Cardiac denies chest pain or increased lower extremity edema again he has bilateral BKA's.  GI is not complaining of any abdominal discomfort nausea vomiting diarrhea constipation.--Continues to have a good appetite  GU he does have end-stage renal dialysis does not complain of discomfort.  Musculoskeletal his bilateral BKA's does not complain of joint pain currently at times will complain of buttocks discomfort  Neurologic is not complaining dizziness headache or numbness.  I appears to be in goodspirits does not complain of overt anxiety   Immunization History  Administered Date(s) Administered  . PPD Test 06/21/2016  . Pneumococcal  Polysaccharide-23 06/20/2016   Pertinent  Health Maintenance Due  Topic Date Due  . FOOT EXAM  04/25/1965  . OPHTHALMOLOGY EXAM  04/25/1965  . URINE MICROALBUMIN  04[birth year]1963  . COLONOSCOPY  04/25/2003  . INFLUENZA VACCINE  11/02/2016  . HEMOGLOBIN A1C  12/27/2016   No flowsheet data found. Functional Status Survey:    Vitals:   09/27/16 1419  BP: (!) 157/61  Pulse: 64  Resp: 17  Temp: 98 F (36.7 C)  TempSrc: Oral   manual blood pressure was 130/66  Physical Exam  In general this is a very pleasant middle-aged male in no distress.  His skin is warm and dry-the buttocks abscesses currently covered per wound care this appears to be stable today-there is packing of the perianal area-I do not see any drainage or appreciate any odor or surrounding erythema  Eyes pupils appear reactive to light visual acuity appears grossly intact.  Chest is clear to auscultation there is no labored breathing.  Heart is regular rate and rhythm without murmur gallop or rub.  Abdomen soft somewhat obese nontender with positive bowel sounds.  Musculoskeletal does move all extremities 4 is status post bilateral BKA he does transfer.  Neurologic is grossly intact to speech is clear no lateralizing findings  Psych he is alert and oriented pleasant and appropriate     Labs reviewed:  Recent Labs  06/29/16 0657  09/12/16 0540 09/14/16 0431 09/21/16 0706 09/22/16 1400  NA 140  < > 131* 135 141 137  K 4.6  < > 3.5 3.6 4.0 4.0  CL 102  < > 92* 96* 102 96*  CO2 27  < > 28 30 31  32  GLUCOSE 164*  < > 192* 173* 93 247*  BUN 40*  < > 68* 51* 43* 37*  CREATININE 4.25*  < > 5.13* 4.20* 3.99* 3.54*  CALCIUM 8.4*  < > 8.3* 8.0* 8.2* 8.0*  PHOS 3.4  --  3.3 3.0  --   --   < > = values in this interval not displayed.  Recent Labs  09/12/16 0540 09/14/16 0431 09/21/16 0706  AST  --   --  19  ALT  --   --  13*  ALKPHOS  --   --  51  BILITOT  --   --  0.6  PROT  --   --  5.9*   ALBUMIN 2.2* 2.1* 2.4*    Recent Labs  09/21/16 0706 09/22/16 1400 09/26/16 0445  WBC 8.7 10.4 5.7  NEUTROABS 6.5 8.3* 4.0  HGB 7.2* 7.3* 7.2*  HCT 23.2* 23.7* 23.4*  MCV 96.7 97.5 98.3  PLT 347 316 320   No results found for: TSH Lab Results  Component Value Date   HGBA1C 5.4 06/26/2016   Lab Results  Component Value Date   CHOL 182 08/10/2009   HDL 32 08/10/2009   LDLCALC 40 10/29/2007   TRIG 419 08/10/2009    Significant Diagnostic Results in last 30 days:  No results found.  Assessment/Plan  #1-history of buttocks abscess and perianal lesion-again he is status post incision and drainage is on Augmentin until July 4 and doxycycline until July 5-his surgeon also has been contacted-at this point these appear to be stable per discussion with wound care nurse-will monitor his white count was reassuring and was not elevated yesterday at 5.7 he is not febrile it appears to be at baseline.  #2 diabetes-his Lantus was increased yesterday so far as sugars appear to be somewhat improved largely in the 100s today so far at this time continue current Lantus dose of 35 units daily as well as the Humalog sliding scale.  #3 anemia and chronic kidney disease on chronic dialysis-he has been guaiac negative in the facility hemoglobin appears to be relatively stabilized at 7.2 on most recent lab-dialysis is aware and apparently there are plans for IV iron.  #4-history hypertension at this point will continue to monitor manual reading today was 130/66 he is on Catapres as well as Coreg at this point will monitor.  [id number]

## 2016-09-28 DIAGNOSIS — Z992 Dependence on renal dialysis: Secondary | ICD-10-CM | POA: Diagnosis not present

## 2016-09-28 DIAGNOSIS — D509 Iron deficiency anemia, unspecified: Secondary | ICD-10-CM | POA: Diagnosis not present

## 2016-09-28 DIAGNOSIS — Z23 Encounter for immunization: Secondary | ICD-10-CM | POA: Diagnosis not present

## 2016-09-28 DIAGNOSIS — N186 End stage renal disease: Secondary | ICD-10-CM | POA: Diagnosis not present

## 2016-09-28 DIAGNOSIS — D631 Anemia in chronic kidney disease: Secondary | ICD-10-CM | POA: Diagnosis not present

## 2016-09-29 DIAGNOSIS — Z89519 Acquired absence of unspecified leg below knee: Secondary | ICD-10-CM | POA: Diagnosis not present

## 2016-09-29 DIAGNOSIS — I5043 Acute on chronic combined systolic (congestive) and diastolic (congestive) heart failure: Secondary | ICD-10-CM | POA: Diagnosis not present

## 2016-09-30 DIAGNOSIS — D631 Anemia in chronic kidney disease: Secondary | ICD-10-CM | POA: Diagnosis not present

## 2016-09-30 DIAGNOSIS — D509 Iron deficiency anemia, unspecified: Secondary | ICD-10-CM | POA: Diagnosis not present

## 2016-09-30 DIAGNOSIS — Z23 Encounter for immunization: Secondary | ICD-10-CM | POA: Diagnosis not present

## 2016-09-30 DIAGNOSIS — Z992 Dependence on renal dialysis: Secondary | ICD-10-CM | POA: Diagnosis not present

## 2016-09-30 DIAGNOSIS — N186 End stage renal disease: Secondary | ICD-10-CM | POA: Diagnosis not present

## 2016-10-01 DIAGNOSIS — N186 End stage renal disease: Secondary | ICD-10-CM | POA: Diagnosis not present

## 2016-10-01 DIAGNOSIS — Z992 Dependence on renal dialysis: Secondary | ICD-10-CM | POA: Diagnosis not present

## 2016-10-02 ENCOUNTER — Encounter (HOSPITAL_COMMUNITY)
Admission: AD | Admit: 2016-10-02 | Discharge: 2016-10-02 | Disposition: A | Discharge status: Home / Self Care | Payer: Medicare Other | Source: Skilled Nursing Facility

## 2016-10-02 LAB — OCCULT BLOOD X 1 CARD TO LAB, STOOL
Fecal Occult Bld: NEGATIVE
Fecal Occult Bld: NEGATIVE

## 2016-10-03 ENCOUNTER — Encounter (HOSPITAL_COMMUNITY)
Admission: RE | Admit: 2016-10-03 | Discharge: 2016-10-03 | Disposition: A | Discharge status: Home / Self Care | Payer: Medicare Other | Source: Skilled Nursing Facility | Attending: *Deleted | Admitting: *Deleted

## 2016-10-03 DIAGNOSIS — Z992 Dependence on renal dialysis: Secondary | ICD-10-CM | POA: Diagnosis not present

## 2016-10-03 DIAGNOSIS — N2581 Secondary hyperparathyroidism of renal origin: Secondary | ICD-10-CM | POA: Diagnosis not present

## 2016-10-03 DIAGNOSIS — D509 Iron deficiency anemia, unspecified: Secondary | ICD-10-CM | POA: Diagnosis not present

## 2016-10-03 DIAGNOSIS — D631 Anemia in chronic kidney disease: Secondary | ICD-10-CM | POA: Diagnosis not present

## 2016-10-03 DIAGNOSIS — N186 End stage renal disease: Secondary | ICD-10-CM | POA: Diagnosis not present

## 2016-10-03 LAB — CBC WITH DIFFERENTIAL/PLATELET
BASOS ABS: 0 10*3/uL (ref 0.0–0.1)
BASOS PCT: 0 %
EOS PCT: 5 %
Eosinophils Absolute: 0.3 10*3/uL (ref 0.0–0.7)
HCT: 26.6 % — ABNORMAL LOW (ref 39.0–52.0)
Hemoglobin: 8.1 g/dL — ABNORMAL LOW (ref 13.0–17.0)
Lymphocytes Relative: 23 %
Lymphs Abs: 1.5 10*3/uL (ref 0.7–4.0)
MCH: 31.2 pg (ref 26.0–34.0)
MCHC: 30.5 g/dL (ref 30.0–36.0)
MCV: 102.3 fL — AB (ref 78.0–100.0)
MONO ABS: 0.5 10*3/uL (ref 0.1–1.0)
Monocytes Relative: 7 %
Neutro Abs: 4.1 10*3/uL (ref 1.7–7.7)
Neutrophils Relative %: 65 %
PLATELETS: 293 10*3/uL (ref 150–400)
RBC: 2.6 MIL/uL — ABNORMAL LOW (ref 4.22–5.81)
RDW: 16.4 % — AB (ref 11.5–15.5)
WBC: 6.4 10*3/uL (ref 4.0–10.5)

## 2016-10-03 LAB — BASIC METABOLIC PANEL
Anion gap: 10 (ref 5–15)
BUN: 52 mg/dL — AB (ref 6–20)
CALCIUM: 8.1 mg/dL — AB (ref 8.9–10.3)
CO2: 30 mmol/L (ref 22–32)
Chloride: 99 mmol/L — ABNORMAL LOW (ref 101–111)
Creatinine, Ser: 3.94 mg/dL — ABNORMAL HIGH (ref 0.61–1.24)
GFR calc Af Amer: 17 mL/min — ABNORMAL LOW (ref >60)
GFR, EST NON AFRICAN AMERICAN: 15 mL/min — AB (ref >60)
GLUCOSE: 150 mg/dL — AB (ref 65–99)
Potassium: 4 mmol/L (ref 3.5–5.1)
Sodium: 139 mmol/L (ref 135–145)

## 2016-10-05 DIAGNOSIS — N186 End stage renal disease: Secondary | ICD-10-CM | POA: Diagnosis not present

## 2016-10-05 DIAGNOSIS — N2581 Secondary hyperparathyroidism of renal origin: Secondary | ICD-10-CM | POA: Diagnosis not present

## 2016-10-05 DIAGNOSIS — D509 Iron deficiency anemia, unspecified: Secondary | ICD-10-CM | POA: Diagnosis not present

## 2016-10-05 DIAGNOSIS — Z992 Dependence on renal dialysis: Secondary | ICD-10-CM | POA: Diagnosis not present

## 2016-10-05 DIAGNOSIS — D631 Anemia in chronic kidney disease: Secondary | ICD-10-CM | POA: Diagnosis not present

## 2016-10-07 DIAGNOSIS — D509 Iron deficiency anemia, unspecified: Secondary | ICD-10-CM | POA: Diagnosis not present

## 2016-10-07 DIAGNOSIS — D631 Anemia in chronic kidney disease: Secondary | ICD-10-CM | POA: Diagnosis not present

## 2016-10-07 DIAGNOSIS — N186 End stage renal disease: Secondary | ICD-10-CM | POA: Diagnosis not present

## 2016-10-07 DIAGNOSIS — N2581 Secondary hyperparathyroidism of renal origin: Secondary | ICD-10-CM | POA: Diagnosis not present

## 2016-10-07 DIAGNOSIS — Z992 Dependence on renal dialysis: Secondary | ICD-10-CM | POA: Diagnosis not present

## 2016-10-09 ENCOUNTER — Encounter (HOSPITAL_COMMUNITY)
Admission: AD | Admit: 2016-10-09 | Discharge: 2016-10-09 | Disposition: A | Discharge status: Home / Self Care | Payer: Medicare Other | Source: Skilled Nursing Facility

## 2016-10-09 LAB — OCCULT BLOOD X 1 CARD TO LAB, STOOL: FECAL OCCULT BLD: NEGATIVE

## 2016-10-10 DIAGNOSIS — N186 End stage renal disease: Secondary | ICD-10-CM | POA: Diagnosis not present

## 2016-10-10 DIAGNOSIS — D509 Iron deficiency anemia, unspecified: Secondary | ICD-10-CM | POA: Diagnosis not present

## 2016-10-10 DIAGNOSIS — N2581 Secondary hyperparathyroidism of renal origin: Secondary | ICD-10-CM | POA: Diagnosis not present

## 2016-10-10 DIAGNOSIS — Z992 Dependence on renal dialysis: Secondary | ICD-10-CM | POA: Diagnosis not present

## 2016-10-10 DIAGNOSIS — D631 Anemia in chronic kidney disease: Secondary | ICD-10-CM | POA: Diagnosis not present

## 2016-10-12 DIAGNOSIS — Z992 Dependence on renal dialysis: Secondary | ICD-10-CM | POA: Diagnosis not present

## 2016-10-12 DIAGNOSIS — D631 Anemia in chronic kidney disease: Secondary | ICD-10-CM | POA: Diagnosis not present

## 2016-10-12 DIAGNOSIS — N186 End stage renal disease: Secondary | ICD-10-CM | POA: Diagnosis not present

## 2016-10-12 DIAGNOSIS — D509 Iron deficiency anemia, unspecified: Secondary | ICD-10-CM | POA: Diagnosis not present

## 2016-10-12 DIAGNOSIS — N2581 Secondary hyperparathyroidism of renal origin: Secondary | ICD-10-CM | POA: Diagnosis not present

## 2016-10-14 DIAGNOSIS — D631 Anemia in chronic kidney disease: Secondary | ICD-10-CM | POA: Diagnosis not present

## 2016-10-14 DIAGNOSIS — D509 Iron deficiency anemia, unspecified: Secondary | ICD-10-CM | POA: Diagnosis not present

## 2016-10-14 DIAGNOSIS — Z992 Dependence on renal dialysis: Secondary | ICD-10-CM | POA: Diagnosis not present

## 2016-10-14 DIAGNOSIS — N2581 Secondary hyperparathyroidism of renal origin: Secondary | ICD-10-CM | POA: Diagnosis not present

## 2016-10-14 DIAGNOSIS — N186 End stage renal disease: Secondary | ICD-10-CM | POA: Diagnosis not present

## 2016-10-17 DIAGNOSIS — N186 End stage renal disease: Secondary | ICD-10-CM | POA: Diagnosis not present

## 2016-10-17 DIAGNOSIS — Z992 Dependence on renal dialysis: Secondary | ICD-10-CM | POA: Diagnosis not present

## 2016-10-17 DIAGNOSIS — D509 Iron deficiency anemia, unspecified: Secondary | ICD-10-CM | POA: Diagnosis not present

## 2016-10-17 DIAGNOSIS — N2581 Secondary hyperparathyroidism of renal origin: Secondary | ICD-10-CM | POA: Diagnosis not present

## 2016-10-17 DIAGNOSIS — D631 Anemia in chronic kidney disease: Secondary | ICD-10-CM | POA: Diagnosis not present

## 2016-10-19 DIAGNOSIS — D509 Iron deficiency anemia, unspecified: Secondary | ICD-10-CM | POA: Diagnosis not present

## 2016-10-19 DIAGNOSIS — Z992 Dependence on renal dialysis: Secondary | ICD-10-CM | POA: Diagnosis not present

## 2016-10-19 DIAGNOSIS — D631 Anemia in chronic kidney disease: Secondary | ICD-10-CM | POA: Diagnosis not present

## 2016-10-19 DIAGNOSIS — N186 End stage renal disease: Secondary | ICD-10-CM | POA: Diagnosis not present

## 2016-10-19 DIAGNOSIS — N2581 Secondary hyperparathyroidism of renal origin: Secondary | ICD-10-CM | POA: Diagnosis not present

## 2016-10-20 ENCOUNTER — Encounter: Payer: Self-pay | Admitting: Internal Medicine

## 2016-10-20 ENCOUNTER — Non-Acute Institutional Stay (SKILLED_NURSING_FACILITY): Payer: Medicare Other | Admitting: Internal Medicine

## 2016-10-20 DIAGNOSIS — N186 End stage renal disease: Secondary | ICD-10-CM

## 2016-10-20 DIAGNOSIS — I5042 Chronic combined systolic (congestive) and diastolic (congestive) heart failure: Secondary | ICD-10-CM

## 2016-10-20 DIAGNOSIS — I10 Essential (primary) hypertension: Secondary | ICD-10-CM | POA: Diagnosis not present

## 2016-10-20 DIAGNOSIS — I739 Peripheral vascular disease, unspecified: Secondary | ICD-10-CM | POA: Diagnosis not present

## 2016-10-20 DIAGNOSIS — E1151 Type 2 diabetes mellitus with diabetic peripheral angiopathy without gangrene: Secondary | ICD-10-CM

## 2016-10-20 DIAGNOSIS — D631 Anemia in chronic kidney disease: Secondary | ICD-10-CM

## 2016-10-20 DIAGNOSIS — Z992 Dependence on renal dialysis: Secondary | ICD-10-CM

## 2016-10-20 DIAGNOSIS — L0231 Cutaneous abscess of buttock: Secondary | ICD-10-CM

## 2016-10-20 NOTE — Progress Notes (Signed)
Location:    [facility] Room Number: 126/P Place of Service:  SNF (31) Provider:  Joen Laura, MD  Patient Care Team: [name], MD as PCP - General (Internal Medicine) Liana Gerold, MD as Consulting Physician (Nephrology) Fran Lowes, MD as Referring Physician (Nephrology)  Extended Emergency Contact Information Primary Emergency Contact: Holtsclaw,Mike Address: [address]          [place], [place] [address] [place] Phone: [phone] Relation: Brother Secondary Emergency Contact: Rettke,Roger Address: [address]          [place], [place] [address] [place] Phone: [phone] Mobile Phone: [phone] Relation: Brother  Code Status:  Full Code Goals of care: Advanced Directive information Advanced Directives 10/20/2016  Does Patient Have a Medical Advance Directive? Yes  Type of Advance Directive (No Data)  Does patient want to make changes to medical advance directive? No - Patient declined  Would patient like information on creating a medical advance directive? No - Patient declined   Chief complaint routine visit for medical management of chronic medical conditions including end-stage renal disease on dialysis-type 2 diabetes-history of body aches abscess-CHF-hypertension-hyperlipidemia-anemia of chronic disease-peripheral vascular disease-diabetic retinopathy    HPI:  Pt is a 61 y.o. male seen today for medical management of chronic diseases.  As noted above.  He does have a history of end-stage renal disease and has dialysis 3 days a week he appears to be tolerating this well.  He's also been treated for a buttocks abscess he's finished a course of doxycycline and Augmentin and per nursing this has improved.  Also been evaluated by surgery and actually went to the ER last month in regards to this.  Does have a history diabetes type 2 Lantus was recently  increased now on 35 units blood sugars in the morning appear to be more in the mid 100s later in the day of bit more variability ranging from the mid 100s to occasionally over 200 will check a hemoglobin A1c.  He also has a history of CHF with an ejection fraction of 35-40% he is on Lasix 80 mg twice a day fluid is managed course by dialysis he appears to be doing okay in this regards clinically does not complain of increased shortness of breath and relates about facility in her wheelchair it appears without difficulty he is also on Coreg.  Regards to hypertension is on Catapres Coreg has some variability recent blood pressures 130/80-125/65-169/79 suspect some of this may be fluid related as well as when he has elevations.  Regards to anemia occult blood testing has been negative this is managed it appears by dialysis with consideration apparently of iron supplementation during dialysis-hemoglobin has shown improvement 8.1 most recently  Currently he has no complaints vital signs appear to be stable.     Past Medical History:  Diagnosis Date  . AICD (automatic cardioverter/defibrillator) present   . Anemia   . Arthritis   . Cerebrovascular disease   . CHF (congestive heart failure) ([facility])   . Chronic kidney disease    ESRD, MWF HD  . Diabetes mellitus    type II  . History of TIAs   . Hyperlipidemia   . Hypertension   . Nonischemic cardiomyopathy ([facility])   . PVD (peripheral vascular disease) ([facility])    s/p B BKA  . Shortness of breath dyspnea    with exertion   . Skin disease  Rare  . Stroke ([facility])    "light stroke"  . Tobacco abuse    Past Surgical History:  Procedure Laterality Date  . A/V SHUNTOGRAM Left 06/24/2016   Procedure: A/V Fistulagram;  Surgeon: Elam Dutch, MD;  Location: [facility];  Service: Cardiovascular;  Laterality: Left;  . AV FISTULA PLACEMENT Left 04/08/2015   Procedure: Creation of Left arm BRACHIOCEPHALIC ARTERIOVENOUS  FISTULA ;  Surgeon:  Mal Misty, MD;  Location: [facility];  Service: Vascular;  Laterality: Left;  . CARDIAC DEFIBRILLATOR PLACEMENT    . CARDIAC DEFIBRILLATOR PLACEMENT    . CATARACT EXTRACTION W/PHACO Right 03/17/2015   Procedure: CATARACT EXTRACTION PHACO AND INTRAOCULAR LENS PLACEMENT (IOC);  Surgeon: Rutherford Guys, MD;  Location: [facility];  Service: Ophthalmology;  Laterality: Right;  CDE: 6.59  . EYE SURGERY Right    Cataract  . LEG AMPUTATION BELOW KNEE     bilateral    Allergies  Allergen Reactions  . Acetazolamide Other (See Comments)    Jittery odd feeling. (hyper feeling)  . Contrast Media [Iodinated Diagnostic Agents] Hives    In the 80's   . Other Other (See Comments)    Transfer Dye---"Makes me tired"    Outpatient Encounter Prescriptions as of 10/20/2016  Medication Sig  . acetaminophen (TYLENOL) 500 MG tablet Take 1,000 mg by mouth every 8 (eight) hours as needed for mild pain or headache.   Marland Kitchen aspirin 81 MG tablet Take 81 mg by mouth daily.    . brimonidine (ALPHAGAN) 0.2 % ophthalmic solution Place 1 drop into the right eye every 8 (eight) hours.  . calcium acetate (PHOSLO) 667 MG capsule Take 667 mg by mouth daily with supper. With largest meal of the day  . camphor-menthol (SARNA) lotion Apply 1 application topically every 8 (eight) hours as needed for itching.  . carvedilol (COREG) 25 MG tablet Take 1 tablet (25 mg total) by mouth 2 (two) times daily.  . cloNIDine (CATAPRES) 0.1 MG tablet Take 1 tablet (0.1 mg total) by mouth at bedtime.  . clopidogrel (PLAVIX) 75 MG tablet Take 75 mg by mouth daily.    . fenofibrate 160 MG tablet Take 160 mg by mouth daily.  . folic acid (FOLVITE) 1 MG tablet Take 1 mg by mouth daily.  . furosemide (LASIX) 80 MG tablet Take 80 mg by mouth 2 (two) times daily.   . hydrOXYzine (ATARAX/VISTARIL) 25 MG tablet Take 1 tablet (25 mg total) by mouth every 8 (eight) hours as needed for itching.  . Insulin Glargine (LANTUS SOLOSTAR) 100 UNIT/ML Solostar Pen  Inject 35 Units into the skin daily at 10 pm.  . insulin lispro (HUMALOG KWIKPEN) 100 UNIT/ML KiwkPen Give per sliding scale  . latanoprost (XALATAN) 0.005 % ophthalmic solution Place 1 drop into the right eye at bedtime.  . Nutritional Supplements (NEPRO/CARBSTEADY PO) Take 237 mLs by mouth daily.  Marland Kitchen ofloxacin (OCUFLOX) 0.3 % ophthalmic solution Place 1 drop into the right eye 2 (two) times daily.  Marland Kitchen oxyCODONE (OXY IR/ROXICODONE) 5 MG immediate release tablet Take 1 tablet (5 mg total) by mouth at bedtime as needed for moderate pain.  . prednisoLONE acetate (PRED FORTE) 1 % ophthalmic suspension Place 1 drop into the right eye every 2 (two) hours while awake.  . rosuvastatin (CRESTOR) 20 MG tablet Take 20 mg by mouth daily.  . timolol (TIMOPTIC) 0.5 % ophthalmic solution Place 1 drop into the right eye 2 (two) times daily.  . traMADol (ULTRAM) 50  MG tablet Take 1 tablet (50 mg total) by mouth every 6 (six) hours as needed.  . [DISCONTINUED] amoxicillin-clavulanate (AUGMENTIN) 500-125 MG tablet Take 1 tablet by mouth daily. 10 day course starting on  . [DISCONTINUED] doxycycline (VIBRAMYCIN) 100 MG capsule Take 1 capsule (100 mg total) by mouth 2 (two) times daily.   No facility-administered encounter medications on file as of 10/20/2016.     Review of Systems General denies any fever or chills says he feels well s.  Skin History perineal  lesion as well as buttocks abscess appears to have stabilized per nursing he has finished antibiotic treatment.  Head ears eyes nose mouth and throat does not complaining of any sore throat or visual changes.  Respiratory denies shortness breath or cough.  Cardiac denies chest pain or increased lower extremity edema again he has bilateral BKA's.  GI is not complaining of any abdominal discomfort nausea vomiting diarrhea constipation.--C  GU he does have end-stage renal dialysis does not complain of discomfort.  Musculoskeletal his bilateral  BKA's does not complain of joint pain currently   Neurologic is not complaining dizziness headache or numbness.  Psych-does not complain of depression or anxiety   Immunization History  Administered Date(s) Administered  . PPD Test 06/21/2016  . Pneumococcal Polysaccharide-23 06/20/2016   Pertinent  Health Maintenance Due  Topic Date Due  . OPHTHALMOLOGY EXAM  04/04/2017 (Originally 04/25/1965)  . URINE MICROALBUMIN  04/04/2017 (Originally 04/25/1965)  . COLONOSCOPY  04/04/2017 (Originally 04/25/2003)  . INFLUENZA VACCINE  11/02/2016  . HEMOGLOBIN A1C  12/27/2016   No flowsheet data found. Functional Status Survey:    Vitals:   10/20/16 1329  BP: 130/80  Pulse: 72     Weight appears relatively stable at 230.8 pounds   is afebrile respirations of 18   Physical Exam   In general this is a very pleasant middle-aged male in no distress.  His skin is warm and dry- Buttocks abscess could not be assessed secondary patient positioning in wheelchair--per wound care of this is stabilized  Eyes pupils appear reactive to light visual acuity appears grossly intact.  Chest is clear to auscultation there is no labored breathing. Air entry is somewhat shallow  Heart is regular rate and rhythm without murmur gallop or rub.  Abdomen soft somewhat obese nontender with positive bowel sounds.  Musculoskeletal does move all extremities 4 is status post bilateral BKA he does transfer. Bruit is appreciated shunt upper left arm  Neurologic is grossly intact to speech is clear no lateralizing findings  Psych he is alert and oriented pleasant and appropriate    Labs reviewed:  Recent Labs  06/29/16 0657  09/12/16 0540 09/14/16 0431 09/21/16 0706 09/22/16 1400 10/03/16 0821  NA 140  < > 131* 135 141 137 139  K 4.6  < > 3.5 3.6 4.0 4.0 4.0  CL 102  < > 92* 96* 102 96* 99*  CO2 27  < > 28 30 31  32 30  GLUCOSE 164*  < > 192* 173* 93 247* 150*  BUN 40*  < > 68*  51* 43* 37* 52*  CREATININE 4.25*  < > 5.13* 4.20* 3.99* 3.54* 3.94*  CALCIUM 8.4*  < > 8.3* 8.0* 8.2* 8.0* 8.1*  PHOS 3.4  --  3.3 3.0  --   --   --   < > = values in this interval not displayed.  Recent Labs  09/12/16 0540 09/14/16 0431 09/21/16 0706  AST  --   --  19  ALT  --   --  13*  ALKPHOS  --   --  51  BILITOT  --   --  0.6  PROT  --   --  5.9*  ALBUMIN 2.2* 2.1* 2.4*    Recent Labs  09/22/16 1400 09/26/16 0445 10/03/16 0821  WBC 10.4 5.7 6.4  NEUTROABS 8.3* 4.0 4.1  HGB 7.3* 7.2* 8.1*  HCT 23.7* 23.4* 26.6*  MCV 97.5 98.3 102.3*  PLT 316 320 293   No results found for: TSH Lab Results  Component Value Date   HGBA1C 5.4 06/26/2016   Lab Results  Component Value Date   CHOL 182 08/10/2009   HDL 32 08/10/2009   LDLCALC 40 10/29/2007   TRIG 419 08/10/2009    Significant Diagnostic Results in last 30 days:  No results found.  Assessment/Plan  #1 history of end-stage renal disease on dialysis he appears to be tolerating this well will check a BMP for updated values back on July 2 to creatinine was 3.94 BUN 52 again this is followed by dialysis.  #2 diabetes type 2 has some variability in blood sugars later in the day morning sugars appear to be relatively stable in the mid 100s will check a hemoglobin A1c.  He is on Lantus 35 units a day which was recently increased.  #3 history of congestive heart failure ejection fraction 35-40 percent he is on Lasix 80 mg twice a day as well as a beta blocker fluid volume is managed as well by dialysis he appears clinically stable.  #4 history of hypertension this appears relatively stable as noted above he is on Catapres it Coreg will monitor for now.  #5 history of anemia with an element of chronic disease occult blood testing has been negative hemoglobin has shown some improvement at 8.1 on most recent lab there has been consideration for iron supplementation at dialysis.  #6 history of periphe ral vascular  disease he continues on Plavix anfd ASAhe is status post bilateral BKA's appears to be doing well with supportive care here. Does receive oxycodone as needed for pain  #7-history of hyperlipidemia continues on Crestor will update a lipid panel.  #8 history of buttock abscess again this is followed by wound care status post antibiotic apparently done well here --.  #9-history of diabetic retinopathy he is on numerous medications including Timoptic and and prednisone eyedrops  Getting will update a hemoglobin A1c-CBC-lipid panel-and metabolic panel  [id number]

## 2016-10-21 ENCOUNTER — Encounter (HOSPITAL_COMMUNITY)
Admission: RE | Admit: 2016-10-21 | Discharge: 2016-10-21 | Disposition: A | Discharge status: Home / Self Care | Payer: Medicare Other | Source: Skilled Nursing Facility | Attending: *Deleted | Admitting: *Deleted

## 2016-10-21 DIAGNOSIS — Z992 Dependence on renal dialysis: Secondary | ICD-10-CM | POA: Diagnosis not present

## 2016-10-21 DIAGNOSIS — N2581 Secondary hyperparathyroidism of renal origin: Secondary | ICD-10-CM | POA: Diagnosis not present

## 2016-10-21 DIAGNOSIS — D631 Anemia in chronic kidney disease: Secondary | ICD-10-CM | POA: Diagnosis not present

## 2016-10-21 DIAGNOSIS — D509 Iron deficiency anemia, unspecified: Secondary | ICD-10-CM | POA: Diagnosis not present

## 2016-10-21 DIAGNOSIS — N186 End stage renal disease: Secondary | ICD-10-CM | POA: Diagnosis not present

## 2016-10-21 LAB — CBC WITH DIFFERENTIAL/PLATELET
Basophils Absolute: 0 10*3/uL (ref 0.0–0.1)
Basophils Relative: 0 %
EOS ABS: 0.2 10*3/uL (ref 0.0–0.7)
EOS PCT: 4 %
HCT: 35.1 % — ABNORMAL LOW (ref 39.0–52.0)
Hemoglobin: 11.1 g/dL — ABNORMAL LOW (ref 13.0–17.0)
LYMPHS ABS: 1.3 10*3/uL (ref 0.7–4.0)
Lymphocytes Relative: 19 %
MCH: 31.8 pg (ref 26.0–34.0)
MCHC: 31.6 g/dL (ref 30.0–36.0)
MCV: 100.6 fL — ABNORMAL HIGH (ref 78.0–100.0)
Monocytes Absolute: 0.6 10*3/uL (ref 0.1–1.0)
Monocytes Relative: 8 %
Neutro Abs: 4.7 10*3/uL (ref 1.7–7.7)
Neutrophils Relative %: 69 %
PLATELETS: 253 10*3/uL (ref 150–400)
RBC: 3.49 MIL/uL — AB (ref 4.22–5.81)
RDW: 14.4 % (ref 11.5–15.5)
WBC: 6.9 10*3/uL (ref 4.0–10.5)

## 2016-10-21 LAB — BASIC METABOLIC PANEL
Anion gap: 9 (ref 5–15)
BUN: 43 mg/dL — AB (ref 6–20)
CALCIUM: 8.6 mg/dL — AB (ref 8.9–10.3)
CHLORIDE: 99 mmol/L — AB (ref 101–111)
CO2: 30 mmol/L (ref 22–32)
CREATININE: 3.46 mg/dL — AB (ref 0.61–1.24)
GFR calc Af Amer: 20 mL/min — ABNORMAL LOW (ref >60)
GFR calc non Af Amer: 17 mL/min — ABNORMAL LOW (ref >60)
Glucose, Bld: 151 mg/dL — ABNORMAL HIGH (ref 65–99)
Potassium: 4.2 mmol/L (ref 3.5–5.1)
SODIUM: 138 mmol/L (ref 135–145)

## 2016-10-21 LAB — LIPID PANEL
CHOLESTEROL: 99 mg/dL (ref 0–200)
HDL: 34 mg/dL — ABNORMAL LOW (ref >40)
LDL Cholesterol: 38 mg/dL (ref 0–99)
Total CHOL/HDL Ratio: 2.9 RATIO
Triglycerides: 135 mg/dL (ref <150)
VLDL: 27 mg/dL (ref 0–40)

## 2016-10-22 LAB — HEMOGLOBIN A1C
Hgb A1c MFr Bld: 6.6 % — ABNORMAL HIGH (ref 4.8–5.6)
MEAN PLASMA GLUCOSE: 143 mg/dL

## 2016-10-24 DIAGNOSIS — D631 Anemia in chronic kidney disease: Secondary | ICD-10-CM | POA: Diagnosis not present

## 2016-10-24 DIAGNOSIS — N186 End stage renal disease: Secondary | ICD-10-CM | POA: Diagnosis not present

## 2016-10-24 DIAGNOSIS — N2581 Secondary hyperparathyroidism of renal origin: Secondary | ICD-10-CM | POA: Diagnosis not present

## 2016-10-24 DIAGNOSIS — Z992 Dependence on renal dialysis: Secondary | ICD-10-CM | POA: Diagnosis not present

## 2016-10-24 DIAGNOSIS — D509 Iron deficiency anemia, unspecified: Secondary | ICD-10-CM | POA: Diagnosis not present

## 2016-10-26 DIAGNOSIS — N186 End stage renal disease: Secondary | ICD-10-CM | POA: Diagnosis not present

## 2016-10-26 DIAGNOSIS — D509 Iron deficiency anemia, unspecified: Secondary | ICD-10-CM | POA: Diagnosis not present

## 2016-10-26 DIAGNOSIS — Z992 Dependence on renal dialysis: Secondary | ICD-10-CM | POA: Diagnosis not present

## 2016-10-26 DIAGNOSIS — D631 Anemia in chronic kidney disease: Secondary | ICD-10-CM | POA: Diagnosis not present

## 2016-10-26 DIAGNOSIS — N2581 Secondary hyperparathyroidism of renal origin: Secondary | ICD-10-CM | POA: Diagnosis not present

## 2016-10-28 DIAGNOSIS — N186 End stage renal disease: Secondary | ICD-10-CM | POA: Diagnosis not present

## 2016-10-28 DIAGNOSIS — D631 Anemia in chronic kidney disease: Secondary | ICD-10-CM | POA: Diagnosis not present

## 2016-10-28 DIAGNOSIS — D509 Iron deficiency anemia, unspecified: Secondary | ICD-10-CM | POA: Diagnosis not present

## 2016-10-28 DIAGNOSIS — Z992 Dependence on renal dialysis: Secondary | ICD-10-CM | POA: Diagnosis not present

## 2016-10-28 DIAGNOSIS — N2581 Secondary hyperparathyroidism of renal origin: Secondary | ICD-10-CM | POA: Diagnosis not present

## 2016-10-29 DIAGNOSIS — I5043 Acute on chronic combined systolic (congestive) and diastolic (congestive) heart failure: Secondary | ICD-10-CM | POA: Diagnosis not present

## 2016-10-29 DIAGNOSIS — Z89519 Acquired absence of unspecified leg below knee: Secondary | ICD-10-CM | POA: Diagnosis not present

## 2016-10-31 DIAGNOSIS — Z992 Dependence on renal dialysis: Secondary | ICD-10-CM | POA: Diagnosis not present

## 2016-10-31 DIAGNOSIS — D631 Anemia in chronic kidney disease: Secondary | ICD-10-CM | POA: Diagnosis not present

## 2016-10-31 DIAGNOSIS — N2581 Secondary hyperparathyroidism of renal origin: Secondary | ICD-10-CM | POA: Diagnosis not present

## 2016-10-31 DIAGNOSIS — N186 End stage renal disease: Secondary | ICD-10-CM | POA: Diagnosis not present

## 2016-10-31 DIAGNOSIS — D509 Iron deficiency anemia, unspecified: Secondary | ICD-10-CM | POA: Diagnosis not present

## 2016-11-01 ENCOUNTER — Non-Acute Institutional Stay (SKILLED_NURSING_FACILITY): Payer: Medicare Other | Admitting: Internal Medicine

## 2016-11-01 ENCOUNTER — Encounter: Payer: Self-pay | Admitting: Internal Medicine

## 2016-11-01 DIAGNOSIS — Z992 Dependence on renal dialysis: Secondary | ICD-10-CM

## 2016-11-01 DIAGNOSIS — I10 Essential (primary) hypertension: Secondary | ICD-10-CM

## 2016-11-01 DIAGNOSIS — I5042 Chronic combined systolic (congestive) and diastolic (congestive) heart failure: Secondary | ICD-10-CM | POA: Diagnosis not present

## 2016-11-01 DIAGNOSIS — L0231 Cutaneous abscess of buttock: Secondary | ICD-10-CM

## 2016-11-01 DIAGNOSIS — E1151 Type 2 diabetes mellitus with diabetic peripheral angiopathy without gangrene: Secondary | ICD-10-CM | POA: Diagnosis not present

## 2016-11-01 DIAGNOSIS — D631 Anemia in chronic kidney disease: Secondary | ICD-10-CM

## 2016-11-01 DIAGNOSIS — N186 End stage renal disease: Secondary | ICD-10-CM

## 2016-11-01 NOTE — Progress Notes (Signed)
Location:   [facility] Room Number: 126/P Place of Service:  SNF (31)  Provider: Granville Lewis  PCP: [name], MD Patient Care Team: [name], MD as PCP - General (Internal Medicine) Liana Gerold, MD as Consulting Physician (Nephrology) Fran Lowes, MD as Referring Physician (Nephrology)  Extended Emergency Contact Information Primary Emergency Contact: Howdeshell,Mike Address: [address]          [place], [place] [address] [place] Phone: [phone] Relation: Brother Secondary Emergency Contact: Calia,Roger Address: [address]          [place], [place] [address] [place] Phone: [phone] Mobile Phone: [phone] Relation: Brother  Code Status: Full Code Goals of care:  Advanced Directive information Advanced Directives 11/01/2016  Does Patient Have a Medical Advance Directive? Yes  Type of Advance Directive (No Data)  Does patient want to make changes to medical advance directive? No - Patient declined  Would patient like information on creating a medical advance directive? No - Patient declined     Allergies  Allergen Reactions  . Acetazolamide Other (See Comments)    Jittery odd feeling. (hyper feeling)  . Contrast Media [Iodinated Diagnostic Agents] Hives    In the 80's   . Other Other (See Comments)    Transfer Dye---"Makes me tired"    Chief Complaint  Patient presents with  . Discharge Note    HPI:  61 y.o. male  seen today for discharge from facility.  He was here for rehabilitation and antibiotics after developing a buttocks abscess-this apparently has improved fairly significantly he will have nursing support at home for wound care as well as diabetic management he also will need CNA support.  He has been afebrile he says the pain is significantly improved.  He does have end-stage renal disease on chronic hemodialysis 3 days a week he appears to be tolerating  this well  He has a history of type 2 diabetes as well his Lantus was recently increased secondary to some elevated readings-blood sugars now appear to be more in the 100s range earlier the day still has some 200s later in the day hemoglobin A1c was 6.6 at this point will defer to primary care provider for any changes.  Currently on 35 units Lantus.  He also has a history CHF ejection fraction of 35-40 percent continues on Lasix 80 mg twice a day and fluid management is followed by dialysis.  Does not complain of any shortness of breath ambulates in his wheelchair it appears without difficulty.  Also has a history hypertension he is on Catapres and Coreg has some continued variability of blood pressures sitting was 155/88 today but this does not appear to be consistent elevation again will defer to dialysis of primary care provider for any changes.  He does have a history of anemia this is managed by dialysis he's been started on iron and hemoglobin appears to be improving at 11.1 on most recent lab.  Currently has no complaints she does live alone he will need again PT and OT for strengthening he is status post bilateral below the knee amputations with a history of peripheral vascular disease.  He will need CNA support to help with ADLs as well as nursing to help with his diabetic and wound care management.  .      Past Medical History:  Diagnosis Date  . AICD (automatic cardioverter/defibrillator) present   . Anemia   . Arthritis   .  Cerebrovascular disease   . CHF (congestive heart failure) ([facility])   . Chronic kidney disease    ESRD, MWF HD  . Diabetes mellitus    type II  . History of TIAs   . Hyperlipidemia   . Hypertension   . Nonischemic cardiomyopathy ([place] [facility])   . PVD (peripheral vascular disease) ([facility])    s/p B BKA  . Shortness of breath dyspnea    with exertion   . Skin disease    Rare  . Stroke [facility])    "light stroke"  . Tobacco abuse     Past Surgical History:   Procedure Laterality Date  . A/V SHUNTOGRAM Left 06/24/2016   Procedure: A/V Fistulagram;  Surgeon: Elam Dutch, MD;  Location: [facility] CV [facility];  Service: Cardiovascular;  Laterality: Left;  . AV FISTULA PLACEMENT Left 04/08/2015   Procedure: Creation of Left arm BRACHIOCEPHALIC ARTERIOVENOUS  FISTULA ;  Surgeon: Mal Misty, MD;  Location: [facility];  Service: Vascular;  Laterality: Left;  . CARDIAC DEFIBRILLATOR PLACEMENT    . CARDIAC DEFIBRILLATOR PLACEMENT    . CATARACT EXTRACTION W/PHACO Right 03/17/2015   Procedure: CATARACT EXTRACTION PHACO AND INTRAOCULAR LENS PLACEMENT (IOC);  Surgeon: Rutherford Guys, MD;  Location: [facility];  Service: Ophthalmology;  Laterality: Right;  CDE: 6.59  . EYE SURGERY Right    Cataract  . LEG AMPUTATION BELOW KNEE     bilateral      reports that he quit smoking about 18 years ago. He has a 20.00 pack-year smoking history. He has never used smokeless tobacco. He reports that he drinks alcohol. He reports that he does not use drugs. Social History   Social History  . Marital status: Legally Separated    Spouse name: N/A  . Number of children: N/A  . Years of education: N/A   Occupational History  . Disabled Biochemist, clinical   Social History Main Topics  . Smoking status: Former Smoker    Packs/day: 1.00    Years: 20.00    Quit date: 03/12/1998  . Smokeless tobacco: Never Used  . Alcohol use 0.0 oz/week     Comment: occasional  . Drug use: No  . Sexual activity: Not on file   Other Topics Concern  . Not on file   Social History Narrative   Divorced   No regular exercise   Occasional caffeine use   Functional Status Survey:    Allergies  Allergen Reactions  . Acetazolamide Other (See Comments)    Jittery odd feeling. (hyper feeling)  . Contrast Media [Iodinated Diagnostic Agents] Hives    In the 80's   . Other Other (See Comments)    Transfer Dye---"Makes me tired"    Pertinent  Health Maintenance Due  Topic Date Due  .  OPHTHALMOLOGY EXAM  04/04/2017 (Originally 04/25/1965)  . URINE MICROALBUMIN  04/04/2017 (Originally 04/25/1965)  . COLONOSCOPY  04/04/2017 (Originally 04/25/2003)  . INFLUENZA VACCINE  11/02/2016  . HEMOGLOBIN A1C  04/23/2017    Medications: Outpatient Encounter Prescriptions as of 11/01/2016  Medication Sig  . acetaminophen (TYLENOL) 500 MG tablet Take 1,000 mg by mouth every 8 (eight) hours as needed for mild pain or headache.   Marland Kitchen aspirin 81 MG tablet Take 81 mg by mouth daily.    . brimonidine (ALPHAGAN) 0.2 % ophthalmic solution Place 1 drop into the right eye 3 (three) times daily.   . calcium acetate (PHOSLO) 667 MG capsule Take 667 mg by mouth daily with supper. With largest  meal of the day  . camphor-menthol (SARNA) lotion Apply 1 application topically every 8 (eight) hours as needed for itching.  . carvedilol (COREG) 25 MG tablet Take 1 tablet (25 mg total) by mouth 2 (two) times daily.  . cloNIDine (CATAPRES) 0.1 MG tablet Take 1 tablet (0.1 mg total) by mouth at bedtime.  . clopidogrel (PLAVIX) 75 MG tablet Take 75 mg by mouth daily.    . fenofibrate 160 MG tablet Take 160 mg by mouth daily.  . folic acid (FOLVITE) 1 MG tablet Take 1 mg by mouth daily.  . furosemide (LASIX) 80 MG tablet Take 80 mg by mouth 2 (two) times daily.   . hydrOXYzine (ATARAX/VISTARIL) 25 MG tablet Take 1 tablet (25 mg total) by mouth every 8 (eight) hours as needed for itching.  . Insulin Glargine (LANTUS SOLOSTAR) 100 UNIT/ML Solostar Pen Inject 35 Units into the skin daily at 10 pm.  . insulin lispro (HUMALOG KWIKPEN) 100 UNIT/ML KiwkPen Give per sliding scale  . latanoprost (XALATAN) 0.005 % ophthalmic solution Place 1 drop into the right eye at bedtime.  . Nutritional Supplements (NEPRO/CARBSTEADY PO) Take 237 mLs by mouth daily.  Marland Kitchen ofloxacin (OCUFLOX) 0.3 % ophthalmic solution Place 1 drop into the right eye 2 (two) times daily.  Marland Kitchen oxyCODONE (OXY IR/ROXICODONE) 5 MG immediate release tablet Take 1  tablet (5 mg total) by mouth at bedtime as needed for moderate pain.  . prednisoLONE acetate (PRED FORTE) 1 % ophthalmic suspension Place 1 drop into the right eye every 2 (two) hours while awake.  . rosuvastatin (CRESTOR) 20 MG tablet Take 20 mg by mouth daily.  . timolol (TIMOPTIC) 0.5 % ophthalmic solution Place 1 drop into the right eye 2 (two) times daily.  . traMADol (ULTRAM) 50 MG tablet Take 1 tablet (50 mg total) by mouth every 6 (six) hours as needed.   No facility-administered encounter medications on file as of 11/01/2016.     Review of Systems  General denies any fever or chills says he feels well forward to going home.  Skin History perineal  lesion as well as buttocks abscess appears to have stabilized per nursing is almost resolved  Head ears eyes nose mouth and throat does not complaining of any sore throat or visual changes.  Respiratory denies shortness breath or cough.  Cardiac denies chest pain or increased lower extremity edema again he has bilateral BKA's.  GI is not complaining of any abdominal discomfort nausea vomiting diarrhea constipation.-  GU he does have end-stage renal dialysis does not complain of discomfort.  Musculoskeletal his bilateral BKA's does not complain of joint pain currently tramadol and oxycodone will be discontinued because he does not use them  Neurologic is not complaining dizziness headache or numbness.  Psych-does not complain of depression or anxiety  -continues to be in good spirits   Physical Exam   He is afebrile pulse 80 respirations 18 blood pressure 155/88 weight is 30.8 pounds  In general this is a very pleasant middle-aged male in no distress.  His skin is warm and dry- Buttocks abscess could not be assessed secondary patient positioning in wheelchair--per wound care of this is almost resolved  He has dialysis shunt on his left arm   Bruit is appreciated  Eyes pupils appear reactive to light visual  acuity appears grossly intact.  Chest is clear to auscultation there is no labored breathing. Air entry is somewhat shallow  Heart is regular rate and rhythm without murmur gallop or  rub.  Abdomen soft  obese nontender with positive bowel sounds.  Musculoskeletal does move all extremities 4 is status post bilateral BKA he does transfer. Bruit is appreciated shunt upper left arm  Neurologic is grossly intact to speech is clear no lateralizing findings  Psych he is alert and oriented pleasant and appropriate  Labs reviewed: Basic Metabolic Panel:  Recent Labs  06/29/16 0657  09/12/16 0540 09/14/16 0431  09/22/16 1400 10/03/16 0821 10/21/16 0703  NA 140  < > 131* 135  < > 137 139 138  K 4.6  < > 3.5 3.6  < > 4.0 4.0 4.2  CL 102  < > 92* 96*  < > 96* 99* 99*  CO2 27  < > 28 30  < > 32 30 30  GLUCOSE 164*  < > 192* 173*  < > 247* 150* 151*  BUN 40*  < > 68* 51*  < > 37* 52* 43*  CREATININE 4.25*  < > 5.13* 4.20*  < > 3.54* 3.94* 3.46*  CALCIUM 8.4*  < > 8.3* 8.0*  < > 8.0* 8.1* 8.6*  PHOS 3.4  --  3.3 3.0  --   --   --   --   < > = values in this interval not displayed. Liver Function Tests:  Recent Labs  09/12/16 0540 09/14/16 0431 09/21/16 0706  AST  --   --  19  ALT  --   --  13*  ALKPHOS  --   --  51  BILITOT  --   --  0.6  PROT  --   --  5.9*  ALBUMIN 2.2* 2.1* 2.4*   No results for input(s): LIPASE, AMYLASE in the last 8760 hours. No results for input(s): AMMONIA in the last 8760 hours. CBC:  Recent Labs  09/26/16 0445 10/03/16 0821 10/21/16 0703  WBC 5.7 6.4 6.9  NEUTROABS 4.0 4.1 4.7  HGB 7.2* 8.1* 11.1*  HCT 23.4* 26.6* 35.1*  MCV 98.3 102.3* 100.6*  PLT 320 293 253   Cardiac Enzymes:  Recent Labs  06/19/16 0545 06/19/16 1143 06/19/16 1606  TROPONINI 0.04* 0.04* 0.04*   BNP: Invalid input(s): POCBNP CBG:  Recent Labs  09/13/16 2159 09/14/16 0728 09/14/16 1126  GLUCAP 206* 154* 143*    Procedures and Imaging Studies  During Stay: No results found.  Assessment/Plan:     #1-buttocks abscess-per nursing this is almost resolved he will need wound care follow-up by home health.  #2 history of end-stage renal disease continues on dialysis 3 times a week this appears to be stable.  #3 history of congestive heart failure with ejection fraction of 35-40 percent he is on Lasix 80 mg twice a day as well as Coreg weight appears to be fairly stable this is managed by dialysis.  #4-history of hypertension on Catapres and Coreg has some variable systolics at this point will defer to primary care provider he does not appear to have consistent systolic elevations.  According to patient his blood pressure does drop at times during dialysis.  #5-history of anemia hemoglobin appears to be trending up at 11.1 this is followed by dialysis he is on iron.  #6 history of peripheral vascular disease he is status post bilateral lower extremity amputations he is on Plavix as well as aspirin he does not really complain of pain again his tramadol on oxycodone have been discontinued because he did not use them.  #7 history of hyperlipidemia he is on Crestor LDL most recently  was 38.  #8 history of diabetic retinopathy continues topical eyedrops this appears to be relatively stable certainly at risk here with his history of diabetes.  Again he will be going home he lives by himself but appears to be quite self-sufficient he will need PT and OT with a history of bilateral BKA's and recent buttocks abscess --- also will need CNA to help with activities of daily living in nursing support follow-up on his buttocks abscess as well as diabetic management.  [id number] note greater than 30 minutes spent on this discharge summary-greater than 50% of time spent coordinating plan of care for numerous diagnoses

## 2016-11-02 DIAGNOSIS — N186 End stage renal disease: Secondary | ICD-10-CM | POA: Diagnosis not present

## 2016-11-02 DIAGNOSIS — N2581 Secondary hyperparathyroidism of renal origin: Secondary | ICD-10-CM | POA: Diagnosis not present

## 2016-11-02 DIAGNOSIS — D631 Anemia in chronic kidney disease: Secondary | ICD-10-CM | POA: Diagnosis not present

## 2016-11-02 DIAGNOSIS — Z992 Dependence on renal dialysis: Secondary | ICD-10-CM | POA: Diagnosis not present

## 2016-11-02 DIAGNOSIS — D509 Iron deficiency anemia, unspecified: Secondary | ICD-10-CM | POA: Diagnosis not present

## 2016-11-04 DIAGNOSIS — Z992 Dependence on renal dialysis: Secondary | ICD-10-CM | POA: Diagnosis not present

## 2016-11-04 DIAGNOSIS — N2581 Secondary hyperparathyroidism of renal origin: Secondary | ICD-10-CM | POA: Diagnosis not present

## 2016-11-04 DIAGNOSIS — N186 End stage renal disease: Secondary | ICD-10-CM | POA: Diagnosis not present

## 2016-11-04 DIAGNOSIS — D631 Anemia in chronic kidney disease: Secondary | ICD-10-CM | POA: Diagnosis not present

## 2016-11-04 DIAGNOSIS — D509 Iron deficiency anemia, unspecified: Secondary | ICD-10-CM | POA: Diagnosis not present

## 2016-11-05 DIAGNOSIS — E1122 Type 2 diabetes mellitus with diabetic chronic kidney disease: Secondary | ICD-10-CM | POA: Diagnosis not present

## 2016-11-05 DIAGNOSIS — E113599 Type 2 diabetes mellitus with proliferative diabetic retinopathy without macular edema, unspecified eye: Secondary | ICD-10-CM | POA: Diagnosis not present

## 2016-11-05 DIAGNOSIS — D631 Anemia in chronic kidney disease: Secondary | ICD-10-CM | POA: Diagnosis not present

## 2016-11-05 DIAGNOSIS — I5042 Chronic combined systolic (congestive) and diastolic (congestive) heart failure: Secondary | ICD-10-CM | POA: Diagnosis not present

## 2016-11-05 DIAGNOSIS — I130 Hypertensive heart and chronic kidney disease with heart failure and stage 1 through stage 4 chronic kidney disease, or unspecified chronic kidney disease: Secondary | ICD-10-CM | POA: Diagnosis not present

## 2016-11-05 DIAGNOSIS — E1151 Type 2 diabetes mellitus with diabetic peripheral angiopathy without gangrene: Secondary | ICD-10-CM | POA: Diagnosis not present

## 2016-11-05 DIAGNOSIS — N186 End stage renal disease: Secondary | ICD-10-CM | POA: Diagnosis not present

## 2016-11-05 DIAGNOSIS — Z4800 Encounter for change or removal of nonsurgical wound dressing: Secondary | ICD-10-CM | POA: Diagnosis not present

## 2016-11-05 DIAGNOSIS — L0231 Cutaneous abscess of buttock: Secondary | ICD-10-CM | POA: Diagnosis not present

## 2016-11-05 DIAGNOSIS — Z89511 Acquired absence of right leg below knee: Secondary | ICD-10-CM | POA: Diagnosis not present

## 2016-11-06 DIAGNOSIS — E119 Type 2 diabetes mellitus without complications: Secondary | ICD-10-CM | POA: Diagnosis not present

## 2016-11-07 DIAGNOSIS — N2581 Secondary hyperparathyroidism of renal origin: Secondary | ICD-10-CM | POA: Diagnosis not present

## 2016-11-07 DIAGNOSIS — D631 Anemia in chronic kidney disease: Secondary | ICD-10-CM | POA: Diagnosis not present

## 2016-11-07 DIAGNOSIS — Z992 Dependence on renal dialysis: Secondary | ICD-10-CM | POA: Diagnosis not present

## 2016-11-07 DIAGNOSIS — D509 Iron deficiency anemia, unspecified: Secondary | ICD-10-CM | POA: Diagnosis not present

## 2016-11-07 DIAGNOSIS — N186 End stage renal disease: Secondary | ICD-10-CM | POA: Diagnosis not present

## 2016-11-09 DIAGNOSIS — N2581 Secondary hyperparathyroidism of renal origin: Secondary | ICD-10-CM | POA: Diagnosis not present

## 2016-11-09 DIAGNOSIS — Z992 Dependence on renal dialysis: Secondary | ICD-10-CM | POA: Diagnosis not present

## 2016-11-09 DIAGNOSIS — D509 Iron deficiency anemia, unspecified: Secondary | ICD-10-CM | POA: Diagnosis not present

## 2016-11-09 DIAGNOSIS — N186 End stage renal disease: Secondary | ICD-10-CM | POA: Diagnosis not present

## 2016-11-09 DIAGNOSIS — D631 Anemia in chronic kidney disease: Secondary | ICD-10-CM | POA: Diagnosis not present

## 2016-11-11 DIAGNOSIS — N186 End stage renal disease: Secondary | ICD-10-CM | POA: Diagnosis not present

## 2016-11-11 DIAGNOSIS — D509 Iron deficiency anemia, unspecified: Secondary | ICD-10-CM | POA: Diagnosis not present

## 2016-11-11 DIAGNOSIS — N2581 Secondary hyperparathyroidism of renal origin: Secondary | ICD-10-CM | POA: Diagnosis not present

## 2016-11-11 DIAGNOSIS — Z992 Dependence on renal dialysis: Secondary | ICD-10-CM | POA: Diagnosis not present

## 2016-11-11 DIAGNOSIS — D631 Anemia in chronic kidney disease: Secondary | ICD-10-CM | POA: Diagnosis not present

## 2016-11-14 DIAGNOSIS — N2581 Secondary hyperparathyroidism of renal origin: Secondary | ICD-10-CM | POA: Diagnosis not present

## 2016-11-14 DIAGNOSIS — Z992 Dependence on renal dialysis: Secondary | ICD-10-CM | POA: Diagnosis not present

## 2016-11-14 DIAGNOSIS — D631 Anemia in chronic kidney disease: Secondary | ICD-10-CM | POA: Diagnosis not present

## 2016-11-14 DIAGNOSIS — N186 End stage renal disease: Secondary | ICD-10-CM | POA: Diagnosis not present

## 2016-11-14 DIAGNOSIS — D509 Iron deficiency anemia, unspecified: Secondary | ICD-10-CM | POA: Diagnosis not present

## 2016-11-16 DIAGNOSIS — N2581 Secondary hyperparathyroidism of renal origin: Secondary | ICD-10-CM | POA: Diagnosis not present

## 2016-11-16 DIAGNOSIS — D509 Iron deficiency anemia, unspecified: Secondary | ICD-10-CM | POA: Diagnosis not present

## 2016-11-16 DIAGNOSIS — D631 Anemia in chronic kidney disease: Secondary | ICD-10-CM | POA: Diagnosis not present

## 2016-11-16 DIAGNOSIS — Z992 Dependence on renal dialysis: Secondary | ICD-10-CM | POA: Diagnosis not present

## 2016-11-16 DIAGNOSIS — N186 End stage renal disease: Secondary | ICD-10-CM | POA: Diagnosis not present

## 2016-11-18 DIAGNOSIS — N186 End stage renal disease: Secondary | ICD-10-CM | POA: Diagnosis not present

## 2016-11-18 DIAGNOSIS — D631 Anemia in chronic kidney disease: Secondary | ICD-10-CM | POA: Diagnosis not present

## 2016-11-18 DIAGNOSIS — D509 Iron deficiency anemia, unspecified: Secondary | ICD-10-CM | POA: Diagnosis not present

## 2016-11-18 DIAGNOSIS — Z992 Dependence on renal dialysis: Secondary | ICD-10-CM | POA: Diagnosis not present

## 2016-11-18 DIAGNOSIS — N2581 Secondary hyperparathyroidism of renal origin: Secondary | ICD-10-CM | POA: Diagnosis not present

## 2016-11-21 DIAGNOSIS — N2581 Secondary hyperparathyroidism of renal origin: Secondary | ICD-10-CM | POA: Diagnosis not present

## 2016-11-21 DIAGNOSIS — N186 End stage renal disease: Secondary | ICD-10-CM | POA: Diagnosis not present

## 2016-11-21 DIAGNOSIS — D631 Anemia in chronic kidney disease: Secondary | ICD-10-CM | POA: Diagnosis not present

## 2016-11-21 DIAGNOSIS — D509 Iron deficiency anemia, unspecified: Secondary | ICD-10-CM | POA: Diagnosis not present

## 2016-11-21 DIAGNOSIS — Z992 Dependence on renal dialysis: Secondary | ICD-10-CM | POA: Diagnosis not present

## 2016-11-22 DIAGNOSIS — E782 Mixed hyperlipidemia: Secondary | ICD-10-CM | POA: Diagnosis not present

## 2016-11-22 DIAGNOSIS — E119 Type 2 diabetes mellitus without complications: Secondary | ICD-10-CM | POA: Diagnosis not present

## 2016-11-22 DIAGNOSIS — N186 End stage renal disease: Secondary | ICD-10-CM | POA: Diagnosis not present

## 2016-11-22 DIAGNOSIS — I10 Essential (primary) hypertension: Secondary | ICD-10-CM | POA: Diagnosis not present

## 2016-11-23 DIAGNOSIS — Z992 Dependence on renal dialysis: Secondary | ICD-10-CM | POA: Diagnosis not present

## 2016-11-23 DIAGNOSIS — D631 Anemia in chronic kidney disease: Secondary | ICD-10-CM | POA: Diagnosis not present

## 2016-11-23 DIAGNOSIS — N2581 Secondary hyperparathyroidism of renal origin: Secondary | ICD-10-CM | POA: Diagnosis not present

## 2016-11-23 DIAGNOSIS — N186 End stage renal disease: Secondary | ICD-10-CM | POA: Diagnosis not present

## 2016-11-23 DIAGNOSIS — D509 Iron deficiency anemia, unspecified: Secondary | ICD-10-CM | POA: Diagnosis not present

## 2016-11-25 DIAGNOSIS — N186 End stage renal disease: Secondary | ICD-10-CM | POA: Diagnosis not present

## 2016-11-25 DIAGNOSIS — D631 Anemia in chronic kidney disease: Secondary | ICD-10-CM | POA: Diagnosis not present

## 2016-11-25 DIAGNOSIS — N2581 Secondary hyperparathyroidism of renal origin: Secondary | ICD-10-CM | POA: Diagnosis not present

## 2016-11-25 DIAGNOSIS — D509 Iron deficiency anemia, unspecified: Secondary | ICD-10-CM | POA: Diagnosis not present

## 2016-11-25 DIAGNOSIS — Z992 Dependence on renal dialysis: Secondary | ICD-10-CM | POA: Diagnosis not present

## 2016-11-28 DIAGNOSIS — D509 Iron deficiency anemia, unspecified: Secondary | ICD-10-CM | POA: Diagnosis not present

## 2016-11-28 DIAGNOSIS — Z992 Dependence on renal dialysis: Secondary | ICD-10-CM | POA: Diagnosis not present

## 2016-11-28 DIAGNOSIS — N2581 Secondary hyperparathyroidism of renal origin: Secondary | ICD-10-CM | POA: Diagnosis not present

## 2016-11-28 DIAGNOSIS — D631 Anemia in chronic kidney disease: Secondary | ICD-10-CM | POA: Diagnosis not present

## 2016-11-28 DIAGNOSIS — N186 End stage renal disease: Secondary | ICD-10-CM | POA: Diagnosis not present

## 2016-11-29 DIAGNOSIS — I5043 Acute on chronic combined systolic (congestive) and diastolic (congestive) heart failure: Secondary | ICD-10-CM | POA: Diagnosis not present

## 2016-11-29 DIAGNOSIS — Z89519 Acquired absence of unspecified leg below knee: Secondary | ICD-10-CM | POA: Diagnosis not present

## 2016-11-30 DIAGNOSIS — N186 End stage renal disease: Secondary | ICD-10-CM | POA: Diagnosis not present

## 2016-11-30 DIAGNOSIS — D631 Anemia in chronic kidney disease: Secondary | ICD-10-CM | POA: Diagnosis not present

## 2016-11-30 DIAGNOSIS — D509 Iron deficiency anemia, unspecified: Secondary | ICD-10-CM | POA: Diagnosis not present

## 2016-11-30 DIAGNOSIS — N2581 Secondary hyperparathyroidism of renal origin: Secondary | ICD-10-CM | POA: Diagnosis not present

## 2016-11-30 DIAGNOSIS — Z992 Dependence on renal dialysis: Secondary | ICD-10-CM | POA: Diagnosis not present

## 2016-12-02 DIAGNOSIS — N186 End stage renal disease: Secondary | ICD-10-CM | POA: Diagnosis not present

## 2016-12-02 DIAGNOSIS — D631 Anemia in chronic kidney disease: Secondary | ICD-10-CM | POA: Diagnosis not present

## 2016-12-02 DIAGNOSIS — D509 Iron deficiency anemia, unspecified: Secondary | ICD-10-CM | POA: Diagnosis not present

## 2016-12-02 DIAGNOSIS — Z992 Dependence on renal dialysis: Secondary | ICD-10-CM | POA: Diagnosis not present

## 2016-12-02 DIAGNOSIS — N2581 Secondary hyperparathyroidism of renal origin: Secondary | ICD-10-CM | POA: Diagnosis not present

## 2016-12-05 DIAGNOSIS — D631 Anemia in chronic kidney disease: Secondary | ICD-10-CM | POA: Diagnosis not present

## 2016-12-05 DIAGNOSIS — Z992 Dependence on renal dialysis: Secondary | ICD-10-CM | POA: Diagnosis not present

## 2016-12-05 DIAGNOSIS — N186 End stage renal disease: Secondary | ICD-10-CM | POA: Diagnosis not present

## 2016-12-05 DIAGNOSIS — D509 Iron deficiency anemia, unspecified: Secondary | ICD-10-CM | POA: Diagnosis not present

## 2016-12-05 DIAGNOSIS — N2581 Secondary hyperparathyroidism of renal origin: Secondary | ICD-10-CM | POA: Diagnosis not present

## 2016-12-06 DIAGNOSIS — H4051X2 Glaucoma secondary to other eye disorders, right eye, moderate stage: Secondary | ICD-10-CM | POA: Diagnosis not present

## 2016-12-06 DIAGNOSIS — Z961 Presence of intraocular lens: Secondary | ICD-10-CM | POA: Diagnosis not present

## 2016-12-06 DIAGNOSIS — H2512 Age-related nuclear cataract, left eye: Secondary | ICD-10-CM | POA: Diagnosis not present

## 2016-12-06 DIAGNOSIS — E113493 Type 2 diabetes mellitus with severe nonproliferative diabetic retinopathy without macular edema, bilateral: Secondary | ICD-10-CM | POA: Diagnosis not present

## 2016-12-07 DIAGNOSIS — N2581 Secondary hyperparathyroidism of renal origin: Secondary | ICD-10-CM | POA: Diagnosis not present

## 2016-12-07 DIAGNOSIS — D509 Iron deficiency anemia, unspecified: Secondary | ICD-10-CM | POA: Diagnosis not present

## 2016-12-07 DIAGNOSIS — N186 End stage renal disease: Secondary | ICD-10-CM | POA: Diagnosis not present

## 2016-12-07 DIAGNOSIS — D631 Anemia in chronic kidney disease: Secondary | ICD-10-CM | POA: Diagnosis not present

## 2016-12-07 DIAGNOSIS — Z992 Dependence on renal dialysis: Secondary | ICD-10-CM | POA: Diagnosis not present

## 2016-12-09 DIAGNOSIS — D509 Iron deficiency anemia, unspecified: Secondary | ICD-10-CM | POA: Diagnosis not present

## 2016-12-09 DIAGNOSIS — Z992 Dependence on renal dialysis: Secondary | ICD-10-CM | POA: Diagnosis not present

## 2016-12-09 DIAGNOSIS — D631 Anemia in chronic kidney disease: Secondary | ICD-10-CM | POA: Diagnosis not present

## 2016-12-09 DIAGNOSIS — N186 End stage renal disease: Secondary | ICD-10-CM | POA: Diagnosis not present

## 2016-12-09 DIAGNOSIS — N2581 Secondary hyperparathyroidism of renal origin: Secondary | ICD-10-CM | POA: Diagnosis not present

## 2016-12-12 DIAGNOSIS — Z992 Dependence on renal dialysis: Secondary | ICD-10-CM | POA: Diagnosis not present

## 2016-12-12 DIAGNOSIS — N2581 Secondary hyperparathyroidism of renal origin: Secondary | ICD-10-CM | POA: Diagnosis not present

## 2016-12-12 DIAGNOSIS — D509 Iron deficiency anemia, unspecified: Secondary | ICD-10-CM | POA: Diagnosis not present

## 2016-12-12 DIAGNOSIS — D631 Anemia in chronic kidney disease: Secondary | ICD-10-CM | POA: Diagnosis not present

## 2016-12-12 DIAGNOSIS — N186 End stage renal disease: Secondary | ICD-10-CM | POA: Diagnosis not present

## 2016-12-14 DIAGNOSIS — D631 Anemia in chronic kidney disease: Secondary | ICD-10-CM | POA: Diagnosis not present

## 2016-12-14 DIAGNOSIS — N186 End stage renal disease: Secondary | ICD-10-CM | POA: Diagnosis not present

## 2016-12-14 DIAGNOSIS — N2581 Secondary hyperparathyroidism of renal origin: Secondary | ICD-10-CM | POA: Diagnosis not present

## 2016-12-14 DIAGNOSIS — D509 Iron deficiency anemia, unspecified: Secondary | ICD-10-CM | POA: Diagnosis not present

## 2016-12-14 DIAGNOSIS — Z992 Dependence on renal dialysis: Secondary | ICD-10-CM | POA: Diagnosis not present

## 2016-12-16 DIAGNOSIS — D509 Iron deficiency anemia, unspecified: Secondary | ICD-10-CM | POA: Diagnosis not present

## 2016-12-16 DIAGNOSIS — D631 Anemia in chronic kidney disease: Secondary | ICD-10-CM | POA: Diagnosis not present

## 2016-12-16 DIAGNOSIS — N2581 Secondary hyperparathyroidism of renal origin: Secondary | ICD-10-CM | POA: Diagnosis not present

## 2016-12-16 DIAGNOSIS — Z992 Dependence on renal dialysis: Secondary | ICD-10-CM | POA: Diagnosis not present

## 2016-12-16 DIAGNOSIS — N186 End stage renal disease: Secondary | ICD-10-CM | POA: Diagnosis not present

## 2016-12-19 DIAGNOSIS — D631 Anemia in chronic kidney disease: Secondary | ICD-10-CM | POA: Diagnosis not present

## 2016-12-19 DIAGNOSIS — N186 End stage renal disease: Secondary | ICD-10-CM | POA: Diagnosis not present

## 2016-12-19 DIAGNOSIS — N2581 Secondary hyperparathyroidism of renal origin: Secondary | ICD-10-CM | POA: Diagnosis not present

## 2016-12-19 DIAGNOSIS — D509 Iron deficiency anemia, unspecified: Secondary | ICD-10-CM | POA: Diagnosis not present

## 2016-12-19 DIAGNOSIS — Z992 Dependence on renal dialysis: Secondary | ICD-10-CM | POA: Diagnosis not present

## 2016-12-20 ENCOUNTER — Other Ambulatory Visit: Payer: Self-pay | Admitting: Internal Medicine

## 2016-12-21 DIAGNOSIS — N2581 Secondary hyperparathyroidism of renal origin: Secondary | ICD-10-CM | POA: Diagnosis not present

## 2016-12-21 DIAGNOSIS — N186 End stage renal disease: Secondary | ICD-10-CM | POA: Diagnosis not present

## 2016-12-21 DIAGNOSIS — D631 Anemia in chronic kidney disease: Secondary | ICD-10-CM | POA: Diagnosis not present

## 2016-12-21 DIAGNOSIS — D509 Iron deficiency anemia, unspecified: Secondary | ICD-10-CM | POA: Diagnosis not present

## 2016-12-21 DIAGNOSIS — Z992 Dependence on renal dialysis: Secondary | ICD-10-CM | POA: Diagnosis not present

## 2016-12-22 ENCOUNTER — Other Ambulatory Visit: Payer: Self-pay | Admitting: Internal Medicine

## 2016-12-23 DIAGNOSIS — D509 Iron deficiency anemia, unspecified: Secondary | ICD-10-CM | POA: Diagnosis not present

## 2016-12-23 DIAGNOSIS — D631 Anemia in chronic kidney disease: Secondary | ICD-10-CM | POA: Diagnosis not present

## 2016-12-23 DIAGNOSIS — N2581 Secondary hyperparathyroidism of renal origin: Secondary | ICD-10-CM | POA: Diagnosis not present

## 2016-12-23 DIAGNOSIS — Z992 Dependence on renal dialysis: Secondary | ICD-10-CM | POA: Diagnosis not present

## 2016-12-23 DIAGNOSIS — N186 End stage renal disease: Secondary | ICD-10-CM | POA: Diagnosis not present

## 2016-12-26 DIAGNOSIS — D509 Iron deficiency anemia, unspecified: Secondary | ICD-10-CM | POA: Diagnosis not present

## 2016-12-26 DIAGNOSIS — D631 Anemia in chronic kidney disease: Secondary | ICD-10-CM | POA: Diagnosis not present

## 2016-12-26 DIAGNOSIS — Z992 Dependence on renal dialysis: Secondary | ICD-10-CM | POA: Diagnosis not present

## 2016-12-26 DIAGNOSIS — N2581 Secondary hyperparathyroidism of renal origin: Secondary | ICD-10-CM | POA: Diagnosis not present

## 2016-12-26 DIAGNOSIS — N186 End stage renal disease: Secondary | ICD-10-CM | POA: Diagnosis not present

## 2016-12-27 ENCOUNTER — Other Ambulatory Visit: Payer: Self-pay | Admitting: Internal Medicine

## 2016-12-28 DIAGNOSIS — N2581 Secondary hyperparathyroidism of renal origin: Secondary | ICD-10-CM | POA: Diagnosis not present

## 2016-12-28 DIAGNOSIS — Z992 Dependence on renal dialysis: Secondary | ICD-10-CM | POA: Diagnosis not present

## 2016-12-28 DIAGNOSIS — D509 Iron deficiency anemia, unspecified: Secondary | ICD-10-CM | POA: Diagnosis not present

## 2016-12-28 DIAGNOSIS — N186 End stage renal disease: Secondary | ICD-10-CM | POA: Diagnosis not present

## 2016-12-28 DIAGNOSIS — D631 Anemia in chronic kidney disease: Secondary | ICD-10-CM | POA: Diagnosis not present

## 2016-12-30 DIAGNOSIS — D631 Anemia in chronic kidney disease: Secondary | ICD-10-CM | POA: Diagnosis not present

## 2016-12-30 DIAGNOSIS — I5043 Acute on chronic combined systolic (congestive) and diastolic (congestive) heart failure: Secondary | ICD-10-CM | POA: Diagnosis not present

## 2016-12-30 DIAGNOSIS — D509 Iron deficiency anemia, unspecified: Secondary | ICD-10-CM | POA: Diagnosis not present

## 2016-12-30 DIAGNOSIS — Z89519 Acquired absence of unspecified leg below knee: Secondary | ICD-10-CM | POA: Diagnosis not present

## 2016-12-30 DIAGNOSIS — N2581 Secondary hyperparathyroidism of renal origin: Secondary | ICD-10-CM | POA: Diagnosis not present

## 2016-12-30 DIAGNOSIS — N186 End stage renal disease: Secondary | ICD-10-CM | POA: Diagnosis not present

## 2016-12-30 DIAGNOSIS — Z992 Dependence on renal dialysis: Secondary | ICD-10-CM | POA: Diagnosis not present

## 2017-01-01 DIAGNOSIS — N186 End stage renal disease: Secondary | ICD-10-CM | POA: Diagnosis not present

## 2017-01-01 DIAGNOSIS — Z992 Dependence on renal dialysis: Secondary | ICD-10-CM | POA: Diagnosis not present

## 2017-01-02 DIAGNOSIS — D509 Iron deficiency anemia, unspecified: Secondary | ICD-10-CM | POA: Diagnosis not present

## 2017-01-02 DIAGNOSIS — N2581 Secondary hyperparathyroidism of renal origin: Secondary | ICD-10-CM | POA: Diagnosis not present

## 2017-01-02 DIAGNOSIS — N186 End stage renal disease: Secondary | ICD-10-CM | POA: Diagnosis not present

## 2017-01-02 DIAGNOSIS — Z992 Dependence on renal dialysis: Secondary | ICD-10-CM | POA: Diagnosis not present

## 2017-01-02 DIAGNOSIS — D631 Anemia in chronic kidney disease: Secondary | ICD-10-CM | POA: Diagnosis not present

## 2017-01-02 DIAGNOSIS — Z23 Encounter for immunization: Secondary | ICD-10-CM | POA: Diagnosis not present

## 2017-01-04 DIAGNOSIS — N186 End stage renal disease: Secondary | ICD-10-CM | POA: Diagnosis not present

## 2017-01-04 DIAGNOSIS — Z992 Dependence on renal dialysis: Secondary | ICD-10-CM | POA: Diagnosis not present

## 2017-01-04 DIAGNOSIS — Z23 Encounter for immunization: Secondary | ICD-10-CM | POA: Diagnosis not present

## 2017-01-04 DIAGNOSIS — N2581 Secondary hyperparathyroidism of renal origin: Secondary | ICD-10-CM | POA: Diagnosis not present

## 2017-01-04 DIAGNOSIS — D631 Anemia in chronic kidney disease: Secondary | ICD-10-CM | POA: Diagnosis not present

## 2017-01-04 DIAGNOSIS — D509 Iron deficiency anemia, unspecified: Secondary | ICD-10-CM | POA: Diagnosis not present

## 2017-01-06 DIAGNOSIS — Z23 Encounter for immunization: Secondary | ICD-10-CM | POA: Diagnosis not present

## 2017-01-06 DIAGNOSIS — N2581 Secondary hyperparathyroidism of renal origin: Secondary | ICD-10-CM | POA: Diagnosis not present

## 2017-01-06 DIAGNOSIS — D631 Anemia in chronic kidney disease: Secondary | ICD-10-CM | POA: Diagnosis not present

## 2017-01-06 DIAGNOSIS — D509 Iron deficiency anemia, unspecified: Secondary | ICD-10-CM | POA: Diagnosis not present

## 2017-01-06 DIAGNOSIS — Z992 Dependence on renal dialysis: Secondary | ICD-10-CM | POA: Diagnosis not present

## 2017-01-06 DIAGNOSIS — N186 End stage renal disease: Secondary | ICD-10-CM | POA: Diagnosis not present

## 2017-01-09 ENCOUNTER — Telehealth: Payer: Self-pay | Admitting: *Deleted

## 2017-01-09 ENCOUNTER — Encounter: Payer: Self-pay | Admitting: *Deleted

## 2017-01-09 ENCOUNTER — Other Ambulatory Visit: Payer: Self-pay | Admitting: *Deleted

## 2017-01-09 DIAGNOSIS — D509 Iron deficiency anemia, unspecified: Secondary | ICD-10-CM | POA: Diagnosis not present

## 2017-01-09 DIAGNOSIS — Z23 Encounter for immunization: Secondary | ICD-10-CM | POA: Diagnosis not present

## 2017-01-09 DIAGNOSIS — D631 Anemia in chronic kidney disease: Secondary | ICD-10-CM | POA: Diagnosis not present

## 2017-01-09 DIAGNOSIS — Z992 Dependence on renal dialysis: Secondary | ICD-10-CM | POA: Diagnosis not present

## 2017-01-09 DIAGNOSIS — N2581 Secondary hyperparathyroidism of renal origin: Secondary | ICD-10-CM | POA: Diagnosis not present

## 2017-01-09 DIAGNOSIS — N186 End stage renal disease: Secondary | ICD-10-CM | POA: Diagnosis not present

## 2017-01-09 NOTE — Telephone Encounter (Signed)
Fistulogram scheduled at the the request of Dr. Lynnette Caffey due to "difficult cannulation" of A/V fistula of Left arm. Call to patient with date, time and where to check in for procedure and instructions faxed to [facility] [place] at patient's and Center's request.

## 2017-01-11 DIAGNOSIS — D509 Iron deficiency anemia, unspecified: Secondary | ICD-10-CM | POA: Diagnosis not present

## 2017-01-11 DIAGNOSIS — N2581 Secondary hyperparathyroidism of renal origin: Secondary | ICD-10-CM | POA: Diagnosis not present

## 2017-01-11 DIAGNOSIS — N186 End stage renal disease: Secondary | ICD-10-CM | POA: Diagnosis not present

## 2017-01-11 DIAGNOSIS — Z23 Encounter for immunization: Secondary | ICD-10-CM | POA: Diagnosis not present

## 2017-01-11 DIAGNOSIS — Z992 Dependence on renal dialysis: Secondary | ICD-10-CM | POA: Diagnosis not present

## 2017-01-11 DIAGNOSIS — D631 Anemia in chronic kidney disease: Secondary | ICD-10-CM | POA: Diagnosis not present

## 2017-01-16 DIAGNOSIS — Z992 Dependence on renal dialysis: Secondary | ICD-10-CM | POA: Diagnosis not present

## 2017-01-16 DIAGNOSIS — Z23 Encounter for immunization: Secondary | ICD-10-CM | POA: Diagnosis not present

## 2017-01-16 DIAGNOSIS — D509 Iron deficiency anemia, unspecified: Secondary | ICD-10-CM | POA: Diagnosis not present

## 2017-01-16 DIAGNOSIS — N2581 Secondary hyperparathyroidism of renal origin: Secondary | ICD-10-CM | POA: Diagnosis not present

## 2017-01-16 DIAGNOSIS — N186 End stage renal disease: Secondary | ICD-10-CM | POA: Diagnosis not present

## 2017-01-16 DIAGNOSIS — D631 Anemia in chronic kidney disease: Secondary | ICD-10-CM | POA: Diagnosis not present

## 2017-01-17 ENCOUNTER — Encounter (HOSPITAL_COMMUNITY)
Admission: RE | Disposition: A | Discharge status: Home / Self Care | Payer: Self-pay | Source: Ambulatory Visit | Attending: Surgery

## 2017-01-17 ENCOUNTER — Ambulatory Visit (HOSPITAL_COMMUNITY)
Admission: RE | Admit: 2017-01-17 | Discharge: 2017-01-17 | Disposition: A | Discharge status: Home / Self Care | Payer: Medicare Other | Source: Ambulatory Visit | Attending: Surgery | Admitting: Surgery

## 2017-01-17 DIAGNOSIS — T82898A Other specified complication of vascular prosthetic devices, implants and grafts, initial encounter: Secondary | ICD-10-CM | POA: Diagnosis not present

## 2017-01-17 DIAGNOSIS — N186 End stage renal disease: Secondary | ICD-10-CM | POA: Diagnosis not present

## 2017-01-17 DIAGNOSIS — Y832 Surgical operation with anastomosis, bypass or graft as the cause of abnormal reaction of the patient, or of later complication, without mention of misadventure at the time of the procedure: Secondary | ICD-10-CM | POA: Insufficient documentation

## 2017-01-17 DIAGNOSIS — T82858A Stenosis of vascular prosthetic devices, implants and grafts, initial encounter: Secondary | ICD-10-CM | POA: Insufficient documentation

## 2017-01-17 DIAGNOSIS — Z992 Dependence on renal dialysis: Secondary | ICD-10-CM | POA: Insufficient documentation

## 2017-01-17 HISTORY — PX: PERIPHERAL VASCULAR INTERVENTION: CATH118257

## 2017-01-17 HISTORY — PX: A/V FISTULAGRAM: CATH118298

## 2017-01-17 LAB — GLUCOSE, CAPILLARY
Glucose-Capillary: 100 mg/dL — ABNORMAL HIGH (ref 65–99)
Glucose-Capillary: 92 mg/dL (ref 65–99)

## 2017-01-17 LAB — POCT I-STAT, CHEM 8
BUN: 64 mg/dL — AB (ref 6–20)
CALCIUM ION: 1.02 mmol/L — AB (ref 1.15–1.40)
CREATININE: 5.4 mg/dL — AB (ref 0.61–1.24)
Chloride: 102 mmol/L (ref 101–111)
Glucose, Bld: 116 mg/dL — ABNORMAL HIGH (ref 65–99)
HEMATOCRIT: 38 % — AB (ref 39.0–52.0)
Hemoglobin: 12.9 g/dL — ABNORMAL LOW (ref 13.0–17.0)
Potassium: 4.3 mmol/L (ref 3.5–5.1)
Sodium: 141 mmol/L (ref 135–145)
TCO2: 28 mmol/L (ref 22–32)

## 2017-01-17 SURGERY — A/V FISTULAGRAM
Anesthesia: LOCAL

## 2017-01-17 MED ORDER — METHYLPREDNISOLONE SODIUM SUCC 125 MG IJ SOLR
125.0000 mg | INTRAMUSCULAR | Status: AC
  Administered 2017-01-17: 125 mg via INTRAVENOUS

## 2017-01-17 MED ORDER — DIPHENHYDRAMINE HCL 50 MG/ML IJ SOLN
INTRAMUSCULAR | Status: AC
  Administered 2017-01-17: 25 mg via INTRAVENOUS
  Filled 2017-01-17: qty 1

## 2017-01-17 MED ORDER — MIDAZOLAM HCL 2 MG/2ML IJ SOLN
INTRAMUSCULAR | Status: AC
  Filled 2017-01-17: qty 2

## 2017-01-17 MED ORDER — METHYLPREDNISOLONE SODIUM SUCC 125 MG IJ SOLR
INTRAMUSCULAR | Status: AC
  Administered 2017-01-17: 125 mg via INTRAVENOUS
  Filled 2017-01-17: qty 2

## 2017-01-17 MED ORDER — SODIUM CHLORIDE 0.9% FLUSH: 3.0000 mL | INTRAVENOUS | Status: DC | PRN

## 2017-01-17 MED ORDER — LIDOCAINE HCL 2 % IJ SOLN
INTRAMUSCULAR | Status: DC | PRN
  Administered 2017-01-17: 5 mL

## 2017-01-17 MED ORDER — FENTANYL CITRATE (PF) 100 MCG/2ML IJ SOLN
INTRAMUSCULAR | Status: AC
  Filled 2017-01-17: qty 2

## 2017-01-17 MED ORDER — HEPARIN (PORCINE) IN NACL 2-0.9 UNIT/ML-% IJ SOLN
INTRAMUSCULAR | Status: AC | PRN
  Administered 2017-01-17: 500 mL via INTRA_ARTERIAL

## 2017-01-17 MED ORDER — FAMOTIDINE IN NACL 20-0.9 MG/50ML-% IV SOLN
INTRAVENOUS | Status: AC
  Administered 2017-01-17: 20 mg via INTRAVENOUS
  Filled 2017-01-17: qty 50

## 2017-01-17 MED ORDER — IODIXANOL 320 MG/ML IV SOLN
INTRAVENOUS | Status: DC | PRN
  Administered 2017-01-17: 37 mL via INTRA_ARTERIAL

## 2017-01-17 MED ORDER — MIDAZOLAM HCL 2 MG/2ML IJ SOLN
INTRAMUSCULAR | Status: DC | PRN
  Administered 2017-01-17: 1 mg via INTRAVENOUS

## 2017-01-17 MED ORDER — SODIUM CHLORIDE 0.9 % IV SOLN: 250.0000 mL | INTRAVENOUS | Status: DC | PRN

## 2017-01-17 MED ORDER — LIDOCAINE HCL 2 % IJ SOLN
INTRAMUSCULAR | Status: AC
  Filled 2017-01-17: qty 10

## 2017-01-17 MED ORDER — FENTANYL CITRATE (PF) 100 MCG/2ML IJ SOLN
INTRAMUSCULAR | Status: DC | PRN
  Administered 2017-01-17: 25 ug via INTRAVENOUS

## 2017-01-17 MED ORDER — LABETALOL HCL 5 MG/ML IV SOLN: 10.0000 mg | INTRAVENOUS | Status: DC | PRN

## 2017-01-17 MED ORDER — DIPHENHYDRAMINE HCL 50 MG/ML IJ SOLN
25.0000 mg | INTRAMUSCULAR | Status: AC
  Administered 2017-01-17: 25 mg via INTRAVENOUS

## 2017-01-17 MED ORDER — SODIUM CHLORIDE 0.9% FLUSH: 3.0000 mL | Freq: Two times a day (BID) | INTRAVENOUS | Status: DC

## 2017-01-17 MED ORDER — FAMOTIDINE IN NACL 20-0.9 MG/50ML-% IV SOLN
20.0000 mg | INTRAVENOUS | Status: AC
  Administered 2017-01-17: 20 mg via INTRAVENOUS

## 2017-01-17 MED ORDER — HEPARIN (PORCINE) IN NACL 2-0.9 UNIT/ML-% IJ SOLN
INTRAMUSCULAR | Status: AC
  Filled 2017-01-17: qty 500

## 2017-01-17 MED ORDER — HYDRALAZINE HCL 20 MG/ML IJ SOLN: 5.0000 mg | INTRAMUSCULAR | Status: DC | PRN

## 2017-01-17 SURGICAL SUPPLY — 15 items
BAG SNAP BAND KOVER 36X36 (MISCELLANEOUS) ×3 IMPLANT
BALLN LUTONIX DCB 5X80X130 (BALLOONS) ×3
BALLOON LUTONIX DCB 5X80X130 (BALLOONS) IMPLANT
COVER DOME SNAP 22 D (MISCELLANEOUS) ×3 IMPLANT
COVER PRB 48X5XTLSCP FOLD TPE (BAG) ×2 IMPLANT
COVER PROBE 5X48 (BAG) ×3
KIT ENCORE 26 ADVANTAGE (KITS) ×1 IMPLANT
KIT MICROINTRODUCER STIFF 5F (SHEATH) ×1 IMPLANT
PROTECTION STATION PRESSURIZED (MISCELLANEOUS) ×3
SHEATH PINNACLE R/O II 5F 6CM (SHEATH) ×1 IMPLANT
STATION PROTECTION PRESSURIZED (MISCELLANEOUS) ×2 IMPLANT
STOPCOCK MORSE 400PSI 3WAY (MISCELLANEOUS) ×3 IMPLANT
TRAY PV CATH (CUSTOM PROCEDURE TRAY) ×3 IMPLANT
TUBING CIL FLEX 10 FLL-RA (TUBING) ×3 IMPLANT
WIRE SPARTACORE .014X190CM (WIRE) ×1 IMPLANT

## 2017-01-17 NOTE — H&P (Signed)
   Patient name: [name]. MRN: [id number] DOB: March 13, 1956 Sex: male    HISTORY OF PRESENT ILLNESS:   [name] Hyson. is a 61 y.o. male with ESRD who ius having difficulty with access.  H e is here for fistulogram  CURRENT MEDICATIONS:    Current Facility-Administered Medications  Medication Dose Route Frequency Provider Last Rate Last Dose  . diphenhydrAMINE (BENADRYL) injection 25 mg  25 mg Intravenous On Call Serafina Mitchell, MD      . famotidine (PEPCID) IVPB 20 mg premix  20 mg Intravenous On Call Serafina Mitchell, MD      . methylPREDNISolone sodium succinate (SOLU-MEDROL) 125 mg/2 mL injection 125 mg  125 mg Intravenous On Call Serafina Mitchell, MD      . sodium chloride flush (NS) 0.9 % injection 3 mL  3 mL Intravenous PRN Serafina Mitchell, MD        REVIEW OF SYSTEMS:   [X]  denotes positive finding, [ ]  denotes negative finding Cardiac  Comments:  Chest pain or chest pressure:    Shortness of breath upon exertion:    Short of breath when lying flat:    Irregular heart rhythm:    Constitutional    Fever or chills:      PHYSICAL EXAM:   Vitals:   01/17/17 0757 01/17/17 0758  BP:  (!) 196/78  Pulse:  87  Resp:  18  Temp: 98.3 F (36.8 C) 98.3 F (36.8 C)  TempSrc: Oral Oral  SpO2:  100%  Weight:  230 lb (104.3 kg)    GENERAL: The patient is a well-nourished male, in no acute distress. The vital signs are documented above. CARDIOVASCULAR: There is a regular rate and rhythm. PULMONARY: Non-labored respirations Palpable thrill in fistula  STUDIES:   none   MEDICAL ISSUES:   fistulogram with possible intervention  [name], MD [facility] [phone] Pager [phone]

## 2017-01-17 NOTE — Op Note (Signed)
    Patient name: [name]. MRN: [id number] DOB: 25-Dec-1955 Sex: male  01/17/2017 Pre-operative Diagnosis: Poorly functioning left upper arm fistula Post-operative diagnosis:  Same Surgeon:  [name] Procedure Performed:  1.  Ultrasound-guided access, left cephalic vein   2.  Fistulogram  3.  Drug coated balloon angioplasty, left cephalic vein  4.  Conscious sedation (9mnutes)     Indications:  Patient has a history of chordee with dialysis.  He comes in today for fistulogram  Procedure:  The patient was identified in the holding area and taken to room 8.  The patient was then placed supine on the table and prepped and draped in the usual sterile fashion.  A time out was called.  Conscious sedation was administered with the use of IV fentanyl and Versed in a continuous physician and nurse monitoring.  Heart rate, blood pressure, and oxygen saturations were continuously monitored Ultrasound was used to evaluate the fistula.  The vein was patent and compressible.  A digital ultrasound image was acquired.  The fistula was then accessed under ultrasound guidance using a micropuncture needle.  An 018 wire was then asvanced without resistance and a micropuncture sheath was placed.  Contrast injections were then performed through the sheath.  Findings:  No central venous stenosis was identified, however there is artifact and luminal irregularity around the defibrillator wires as they enter the subclavian vein.  There were no collaterals in this area to suggest a significant stenosis.  Similarly at the cephalic vein turndown region, there is luminal irregularity, however this does not appear to be hemodynamically significant.  There is approximately a 60% stenosis within the proximal portion of the fistula.  This was the area that was treated previously with good results.   Intervention:  After the above images were acquired the decision was made to proceed with intervention.  The  micropuncture sheath was exchanged out for a 5 FPakistansheath.  I then advanced a [place] core wire out into the radial artery.  I selected a 5 x 80 Lutonix drug coated balloon and performed balloon and plasty the proximal portion of the fistula taking the balloon to 8 atm for 90 seconds.  I then pulled the balloon back and treated a separate area taking the balloon to 12 atm for 1 minute.  Follow-up imaging revealed significant improvement of the inflow to the fistula.  At this point catheters and wires were removed.  A pursestring suture was used to close the access site  Impression:  #1  successful angioplasty using a drug coated balloon up (5 mm) to treat the proximal portion of the fistula.    #2  No significant change in the subclavian vein appearance where the defibrillator wires into the vein.  This does not appear to represent a significant stenosis     V. WAnnamarie Major M.D. [facility] GD[facility] CityOffice: [phone]:  [phone]

## 2017-01-17 NOTE — Discharge Instructions (Signed)

## 2017-01-18 ENCOUNTER — Encounter (HOSPITAL_COMMUNITY): Payer: Self-pay | Admitting: Surgery

## 2017-01-18 DIAGNOSIS — Z23 Encounter for immunization: Secondary | ICD-10-CM | POA: Diagnosis not present

## 2017-01-18 DIAGNOSIS — N186 End stage renal disease: Secondary | ICD-10-CM | POA: Diagnosis not present

## 2017-01-18 DIAGNOSIS — D631 Anemia in chronic kidney disease: Secondary | ICD-10-CM | POA: Diagnosis not present

## 2017-01-18 DIAGNOSIS — D509 Iron deficiency anemia, unspecified: Secondary | ICD-10-CM | POA: Diagnosis not present

## 2017-01-18 DIAGNOSIS — N2581 Secondary hyperparathyroidism of renal origin: Secondary | ICD-10-CM | POA: Diagnosis not present

## 2017-01-18 DIAGNOSIS — Z992 Dependence on renal dialysis: Secondary | ICD-10-CM | POA: Diagnosis not present

## 2017-01-20 DIAGNOSIS — N2581 Secondary hyperparathyroidism of renal origin: Secondary | ICD-10-CM | POA: Diagnosis not present

## 2017-01-20 DIAGNOSIS — Z992 Dependence on renal dialysis: Secondary | ICD-10-CM | POA: Diagnosis not present

## 2017-01-20 DIAGNOSIS — D509 Iron deficiency anemia, unspecified: Secondary | ICD-10-CM | POA: Diagnosis not present

## 2017-01-20 DIAGNOSIS — D631 Anemia in chronic kidney disease: Secondary | ICD-10-CM | POA: Diagnosis not present

## 2017-01-20 DIAGNOSIS — N186 End stage renal disease: Secondary | ICD-10-CM | POA: Diagnosis not present

## 2017-01-20 DIAGNOSIS — Z23 Encounter for immunization: Secondary | ICD-10-CM | POA: Diagnosis not present

## 2017-01-23 DIAGNOSIS — Z992 Dependence on renal dialysis: Secondary | ICD-10-CM | POA: Diagnosis not present

## 2017-01-23 DIAGNOSIS — D631 Anemia in chronic kidney disease: Secondary | ICD-10-CM | POA: Diagnosis not present

## 2017-01-23 DIAGNOSIS — D509 Iron deficiency anemia, unspecified: Secondary | ICD-10-CM | POA: Diagnosis not present

## 2017-01-23 DIAGNOSIS — N186 End stage renal disease: Secondary | ICD-10-CM | POA: Diagnosis not present

## 2017-01-23 DIAGNOSIS — Z23 Encounter for immunization: Secondary | ICD-10-CM | POA: Diagnosis not present

## 2017-01-23 DIAGNOSIS — N2581 Secondary hyperparathyroidism of renal origin: Secondary | ICD-10-CM | POA: Diagnosis not present

## 2017-01-24 ENCOUNTER — Other Ambulatory Visit: Payer: Self-pay | Admitting: Internal Medicine

## 2017-01-25 DIAGNOSIS — Z23 Encounter for immunization: Secondary | ICD-10-CM | POA: Diagnosis not present

## 2017-01-25 DIAGNOSIS — N186 End stage renal disease: Secondary | ICD-10-CM | POA: Diagnosis not present

## 2017-01-25 DIAGNOSIS — D509 Iron deficiency anemia, unspecified: Secondary | ICD-10-CM | POA: Diagnosis not present

## 2017-01-25 DIAGNOSIS — Z992 Dependence on renal dialysis: Secondary | ICD-10-CM | POA: Diagnosis not present

## 2017-01-25 DIAGNOSIS — N2581 Secondary hyperparathyroidism of renal origin: Secondary | ICD-10-CM | POA: Diagnosis not present

## 2017-01-25 DIAGNOSIS — D631 Anemia in chronic kidney disease: Secondary | ICD-10-CM | POA: Diagnosis not present

## 2017-01-26 DIAGNOSIS — I10 Essential (primary) hypertension: Secondary | ICD-10-CM | POA: Diagnosis not present

## 2017-01-26 DIAGNOSIS — E782 Mixed hyperlipidemia: Secondary | ICD-10-CM | POA: Diagnosis not present

## 2017-01-26 DIAGNOSIS — E119 Type 2 diabetes mellitus without complications: Secondary | ICD-10-CM | POA: Diagnosis not present

## 2017-01-27 DIAGNOSIS — D509 Iron deficiency anemia, unspecified: Secondary | ICD-10-CM | POA: Diagnosis not present

## 2017-01-27 DIAGNOSIS — Z23 Encounter for immunization: Secondary | ICD-10-CM | POA: Diagnosis not present

## 2017-01-27 DIAGNOSIS — D631 Anemia in chronic kidney disease: Secondary | ICD-10-CM | POA: Diagnosis not present

## 2017-01-27 DIAGNOSIS — Z992 Dependence on renal dialysis: Secondary | ICD-10-CM | POA: Diagnosis not present

## 2017-01-27 DIAGNOSIS — N2581 Secondary hyperparathyroidism of renal origin: Secondary | ICD-10-CM | POA: Diagnosis not present

## 2017-01-27 DIAGNOSIS — N186 End stage renal disease: Secondary | ICD-10-CM | POA: Diagnosis not present

## 2017-01-29 DIAGNOSIS — Z89519 Acquired absence of unspecified leg below knee: Secondary | ICD-10-CM | POA: Diagnosis not present

## 2017-01-29 DIAGNOSIS — I5043 Acute on chronic combined systolic (congestive) and diastolic (congestive) heart failure: Secondary | ICD-10-CM | POA: Diagnosis not present

## 2017-01-30 DIAGNOSIS — Z23 Encounter for immunization: Secondary | ICD-10-CM | POA: Diagnosis not present

## 2017-01-30 DIAGNOSIS — N186 End stage renal disease: Secondary | ICD-10-CM | POA: Diagnosis not present

## 2017-01-30 DIAGNOSIS — Z992 Dependence on renal dialysis: Secondary | ICD-10-CM | POA: Diagnosis not present

## 2017-01-30 DIAGNOSIS — N2581 Secondary hyperparathyroidism of renal origin: Secondary | ICD-10-CM | POA: Diagnosis not present

## 2017-01-30 DIAGNOSIS — D509 Iron deficiency anemia, unspecified: Secondary | ICD-10-CM | POA: Diagnosis not present

## 2017-01-30 DIAGNOSIS — D631 Anemia in chronic kidney disease: Secondary | ICD-10-CM | POA: Diagnosis not present

## 2017-02-01 DIAGNOSIS — N186 End stage renal disease: Secondary | ICD-10-CM | POA: Diagnosis not present

## 2017-02-01 DIAGNOSIS — Z23 Encounter for immunization: Secondary | ICD-10-CM | POA: Diagnosis not present

## 2017-02-01 DIAGNOSIS — D509 Iron deficiency anemia, unspecified: Secondary | ICD-10-CM | POA: Diagnosis not present

## 2017-02-01 DIAGNOSIS — N2581 Secondary hyperparathyroidism of renal origin: Secondary | ICD-10-CM | POA: Diagnosis not present

## 2017-02-01 DIAGNOSIS — Z992 Dependence on renal dialysis: Secondary | ICD-10-CM | POA: Diagnosis not present

## 2017-02-01 DIAGNOSIS — D631 Anemia in chronic kidney disease: Secondary | ICD-10-CM | POA: Diagnosis not present

## 2017-02-02 DIAGNOSIS — N186 End stage renal disease: Secondary | ICD-10-CM | POA: Diagnosis not present

## 2017-02-02 DIAGNOSIS — Z89611 Acquired absence of right leg above knee: Secondary | ICD-10-CM | POA: Diagnosis not present

## 2017-02-02 DIAGNOSIS — I10 Essential (primary) hypertension: Secondary | ICD-10-CM | POA: Diagnosis not present

## 2017-02-02 DIAGNOSIS — E782 Mixed hyperlipidemia: Secondary | ICD-10-CM | POA: Diagnosis not present

## 2017-02-03 DIAGNOSIS — N2581 Secondary hyperparathyroidism of renal origin: Secondary | ICD-10-CM | POA: Diagnosis not present

## 2017-02-03 DIAGNOSIS — D631 Anemia in chronic kidney disease: Secondary | ICD-10-CM | POA: Diagnosis not present

## 2017-02-03 DIAGNOSIS — N186 End stage renal disease: Secondary | ICD-10-CM | POA: Diagnosis not present

## 2017-02-03 DIAGNOSIS — Z992 Dependence on renal dialysis: Secondary | ICD-10-CM | POA: Diagnosis not present

## 2017-02-03 DIAGNOSIS — D509 Iron deficiency anemia, unspecified: Secondary | ICD-10-CM | POA: Diagnosis not present

## 2017-02-06 DIAGNOSIS — D631 Anemia in chronic kidney disease: Secondary | ICD-10-CM | POA: Diagnosis not present

## 2017-02-06 DIAGNOSIS — Z992 Dependence on renal dialysis: Secondary | ICD-10-CM | POA: Diagnosis not present

## 2017-02-06 DIAGNOSIS — N2581 Secondary hyperparathyroidism of renal origin: Secondary | ICD-10-CM | POA: Diagnosis not present

## 2017-02-06 DIAGNOSIS — D509 Iron deficiency anemia, unspecified: Secondary | ICD-10-CM | POA: Diagnosis not present

## 2017-02-06 DIAGNOSIS — N186 End stage renal disease: Secondary | ICD-10-CM | POA: Diagnosis not present

## 2017-02-08 DIAGNOSIS — N2581 Secondary hyperparathyroidism of renal origin: Secondary | ICD-10-CM | POA: Diagnosis not present

## 2017-02-08 DIAGNOSIS — D631 Anemia in chronic kidney disease: Secondary | ICD-10-CM | POA: Diagnosis not present

## 2017-02-08 DIAGNOSIS — D509 Iron deficiency anemia, unspecified: Secondary | ICD-10-CM | POA: Diagnosis not present

## 2017-02-08 DIAGNOSIS — N186 End stage renal disease: Secondary | ICD-10-CM | POA: Diagnosis not present

## 2017-02-08 DIAGNOSIS — Z992 Dependence on renal dialysis: Secondary | ICD-10-CM | POA: Diagnosis not present

## 2017-02-10 DIAGNOSIS — D509 Iron deficiency anemia, unspecified: Secondary | ICD-10-CM | POA: Diagnosis not present

## 2017-02-10 DIAGNOSIS — D631 Anemia in chronic kidney disease: Secondary | ICD-10-CM | POA: Diagnosis not present

## 2017-02-10 DIAGNOSIS — N2581 Secondary hyperparathyroidism of renal origin: Secondary | ICD-10-CM | POA: Diagnosis not present

## 2017-02-10 DIAGNOSIS — N186 End stage renal disease: Secondary | ICD-10-CM | POA: Diagnosis not present

## 2017-02-10 DIAGNOSIS — Z992 Dependence on renal dialysis: Secondary | ICD-10-CM | POA: Diagnosis not present

## 2017-02-13 DIAGNOSIS — N186 End stage renal disease: Secondary | ICD-10-CM | POA: Diagnosis not present

## 2017-02-13 DIAGNOSIS — N2581 Secondary hyperparathyroidism of renal origin: Secondary | ICD-10-CM | POA: Diagnosis not present

## 2017-02-13 DIAGNOSIS — D509 Iron deficiency anemia, unspecified: Secondary | ICD-10-CM | POA: Diagnosis not present

## 2017-02-13 DIAGNOSIS — D631 Anemia in chronic kidney disease: Secondary | ICD-10-CM | POA: Diagnosis not present

## 2017-02-13 DIAGNOSIS — Z992 Dependence on renal dialysis: Secondary | ICD-10-CM | POA: Diagnosis not present

## 2017-02-15 DIAGNOSIS — Z992 Dependence on renal dialysis: Secondary | ICD-10-CM | POA: Diagnosis not present

## 2017-02-15 DIAGNOSIS — N2581 Secondary hyperparathyroidism of renal origin: Secondary | ICD-10-CM | POA: Diagnosis not present

## 2017-02-15 DIAGNOSIS — N186 End stage renal disease: Secondary | ICD-10-CM | POA: Diagnosis not present

## 2017-02-15 DIAGNOSIS — D509 Iron deficiency anemia, unspecified: Secondary | ICD-10-CM | POA: Diagnosis not present

## 2017-02-15 DIAGNOSIS — D631 Anemia in chronic kidney disease: Secondary | ICD-10-CM | POA: Diagnosis not present

## 2017-02-17 DIAGNOSIS — N2581 Secondary hyperparathyroidism of renal origin: Secondary | ICD-10-CM | POA: Diagnosis not present

## 2017-02-17 DIAGNOSIS — N186 End stage renal disease: Secondary | ICD-10-CM | POA: Diagnosis not present

## 2017-02-17 DIAGNOSIS — Z992 Dependence on renal dialysis: Secondary | ICD-10-CM | POA: Diagnosis not present

## 2017-02-17 DIAGNOSIS — D631 Anemia in chronic kidney disease: Secondary | ICD-10-CM | POA: Diagnosis not present

## 2017-02-17 DIAGNOSIS — D509 Iron deficiency anemia, unspecified: Secondary | ICD-10-CM | POA: Diagnosis not present

## 2017-02-20 DIAGNOSIS — N2581 Secondary hyperparathyroidism of renal origin: Secondary | ICD-10-CM | POA: Diagnosis not present

## 2017-02-20 DIAGNOSIS — D509 Iron deficiency anemia, unspecified: Secondary | ICD-10-CM | POA: Diagnosis not present

## 2017-02-20 DIAGNOSIS — Z992 Dependence on renal dialysis: Secondary | ICD-10-CM | POA: Diagnosis not present

## 2017-02-20 DIAGNOSIS — D631 Anemia in chronic kidney disease: Secondary | ICD-10-CM | POA: Diagnosis not present

## 2017-02-20 DIAGNOSIS — N186 End stage renal disease: Secondary | ICD-10-CM | POA: Diagnosis not present

## 2017-02-22 DIAGNOSIS — Z992 Dependence on renal dialysis: Secondary | ICD-10-CM | POA: Diagnosis not present

## 2017-02-22 DIAGNOSIS — N186 End stage renal disease: Secondary | ICD-10-CM | POA: Diagnosis not present

## 2017-02-22 DIAGNOSIS — N2581 Secondary hyperparathyroidism of renal origin: Secondary | ICD-10-CM | POA: Diagnosis not present

## 2017-02-22 DIAGNOSIS — D509 Iron deficiency anemia, unspecified: Secondary | ICD-10-CM | POA: Diagnosis not present

## 2017-02-22 DIAGNOSIS — D631 Anemia in chronic kidney disease: Secondary | ICD-10-CM | POA: Diagnosis not present

## 2017-02-24 DIAGNOSIS — Z992 Dependence on renal dialysis: Secondary | ICD-10-CM | POA: Diagnosis not present

## 2017-02-24 DIAGNOSIS — D631 Anemia in chronic kidney disease: Secondary | ICD-10-CM | POA: Diagnosis not present

## 2017-02-24 DIAGNOSIS — D509 Iron deficiency anemia, unspecified: Secondary | ICD-10-CM | POA: Diagnosis not present

## 2017-02-24 DIAGNOSIS — N186 End stage renal disease: Secondary | ICD-10-CM | POA: Diagnosis not present

## 2017-02-24 DIAGNOSIS — N2581 Secondary hyperparathyroidism of renal origin: Secondary | ICD-10-CM | POA: Diagnosis not present

## 2017-02-27 DIAGNOSIS — D631 Anemia in chronic kidney disease: Secondary | ICD-10-CM | POA: Diagnosis not present

## 2017-02-27 DIAGNOSIS — N186 End stage renal disease: Secondary | ICD-10-CM | POA: Diagnosis not present

## 2017-02-27 DIAGNOSIS — D509 Iron deficiency anemia, unspecified: Secondary | ICD-10-CM | POA: Diagnosis not present

## 2017-02-27 DIAGNOSIS — Z992 Dependence on renal dialysis: Secondary | ICD-10-CM | POA: Diagnosis not present

## 2017-02-27 DIAGNOSIS — N2581 Secondary hyperparathyroidism of renal origin: Secondary | ICD-10-CM | POA: Diagnosis not present

## 2017-03-01 DIAGNOSIS — N186 End stage renal disease: Secondary | ICD-10-CM | POA: Diagnosis not present

## 2017-03-01 DIAGNOSIS — Z89519 Acquired absence of unspecified leg below knee: Secondary | ICD-10-CM | POA: Diagnosis not present

## 2017-03-01 DIAGNOSIS — D509 Iron deficiency anemia, unspecified: Secondary | ICD-10-CM | POA: Diagnosis not present

## 2017-03-01 DIAGNOSIS — Z992 Dependence on renal dialysis: Secondary | ICD-10-CM | POA: Diagnosis not present

## 2017-03-01 DIAGNOSIS — D631 Anemia in chronic kidney disease: Secondary | ICD-10-CM | POA: Diagnosis not present

## 2017-03-01 DIAGNOSIS — N2581 Secondary hyperparathyroidism of renal origin: Secondary | ICD-10-CM | POA: Diagnosis not present

## 2017-03-01 DIAGNOSIS — I5043 Acute on chronic combined systolic (congestive) and diastolic (congestive) heart failure: Secondary | ICD-10-CM | POA: Diagnosis not present

## 2017-03-03 DIAGNOSIS — D509 Iron deficiency anemia, unspecified: Secondary | ICD-10-CM | POA: Diagnosis not present

## 2017-03-03 DIAGNOSIS — N2581 Secondary hyperparathyroidism of renal origin: Secondary | ICD-10-CM | POA: Diagnosis not present

## 2017-03-03 DIAGNOSIS — D631 Anemia in chronic kidney disease: Secondary | ICD-10-CM | POA: Diagnosis not present

## 2017-03-03 DIAGNOSIS — N186 End stage renal disease: Secondary | ICD-10-CM | POA: Diagnosis not present

## 2017-03-03 DIAGNOSIS — Z992 Dependence on renal dialysis: Secondary | ICD-10-CM | POA: Diagnosis not present

## 2017-03-06 DIAGNOSIS — Z992 Dependence on renal dialysis: Secondary | ICD-10-CM | POA: Diagnosis not present

## 2017-03-06 DIAGNOSIS — D509 Iron deficiency anemia, unspecified: Secondary | ICD-10-CM | POA: Diagnosis not present

## 2017-03-06 DIAGNOSIS — N186 End stage renal disease: Secondary | ICD-10-CM | POA: Diagnosis not present

## 2017-03-06 DIAGNOSIS — N2581 Secondary hyperparathyroidism of renal origin: Secondary | ICD-10-CM | POA: Diagnosis not present

## 2017-03-08 DIAGNOSIS — N186 End stage renal disease: Secondary | ICD-10-CM | POA: Diagnosis not present

## 2017-03-08 DIAGNOSIS — D509 Iron deficiency anemia, unspecified: Secondary | ICD-10-CM | POA: Diagnosis not present

## 2017-03-08 DIAGNOSIS — Z992 Dependence on renal dialysis: Secondary | ICD-10-CM | POA: Diagnosis not present

## 2017-03-08 DIAGNOSIS — N2581 Secondary hyperparathyroidism of renal origin: Secondary | ICD-10-CM | POA: Diagnosis not present

## 2017-03-10 DIAGNOSIS — N186 End stage renal disease: Secondary | ICD-10-CM | POA: Diagnosis not present

## 2017-03-10 DIAGNOSIS — N2581 Secondary hyperparathyroidism of renal origin: Secondary | ICD-10-CM | POA: Diagnosis not present

## 2017-03-10 DIAGNOSIS — D509 Iron deficiency anemia, unspecified: Secondary | ICD-10-CM | POA: Diagnosis not present

## 2017-03-10 DIAGNOSIS — Z992 Dependence on renal dialysis: Secondary | ICD-10-CM | POA: Diagnosis not present

## 2017-03-15 DIAGNOSIS — N186 End stage renal disease: Secondary | ICD-10-CM | POA: Diagnosis not present

## 2017-03-15 DIAGNOSIS — N2581 Secondary hyperparathyroidism of renal origin: Secondary | ICD-10-CM | POA: Diagnosis not present

## 2017-03-15 DIAGNOSIS — D509 Iron deficiency anemia, unspecified: Secondary | ICD-10-CM | POA: Diagnosis not present

## 2017-03-15 DIAGNOSIS — Z992 Dependence on renal dialysis: Secondary | ICD-10-CM | POA: Diagnosis not present

## 2017-03-17 DIAGNOSIS — N2581 Secondary hyperparathyroidism of renal origin: Secondary | ICD-10-CM | POA: Diagnosis not present

## 2017-03-17 DIAGNOSIS — D509 Iron deficiency anemia, unspecified: Secondary | ICD-10-CM | POA: Diagnosis not present

## 2017-03-17 DIAGNOSIS — Z992 Dependence on renal dialysis: Secondary | ICD-10-CM | POA: Diagnosis not present

## 2017-03-17 DIAGNOSIS — N186 End stage renal disease: Secondary | ICD-10-CM | POA: Diagnosis not present

## 2017-03-20 DIAGNOSIS — Z992 Dependence on renal dialysis: Secondary | ICD-10-CM | POA: Diagnosis not present

## 2017-03-20 DIAGNOSIS — D509 Iron deficiency anemia, unspecified: Secondary | ICD-10-CM | POA: Diagnosis not present

## 2017-03-20 DIAGNOSIS — N2581 Secondary hyperparathyroidism of renal origin: Secondary | ICD-10-CM | POA: Diagnosis not present

## 2017-03-20 DIAGNOSIS — N186 End stage renal disease: Secondary | ICD-10-CM | POA: Diagnosis not present

## 2017-03-22 DIAGNOSIS — D509 Iron deficiency anemia, unspecified: Secondary | ICD-10-CM | POA: Diagnosis not present

## 2017-03-22 DIAGNOSIS — N186 End stage renal disease: Secondary | ICD-10-CM | POA: Diagnosis not present

## 2017-03-22 DIAGNOSIS — N2581 Secondary hyperparathyroidism of renal origin: Secondary | ICD-10-CM | POA: Diagnosis not present

## 2017-03-22 DIAGNOSIS — Z992 Dependence on renal dialysis: Secondary | ICD-10-CM | POA: Diagnosis not present

## 2017-03-24 DIAGNOSIS — N186 End stage renal disease: Secondary | ICD-10-CM | POA: Diagnosis not present

## 2017-03-24 DIAGNOSIS — D509 Iron deficiency anemia, unspecified: Secondary | ICD-10-CM | POA: Diagnosis not present

## 2017-03-24 DIAGNOSIS — N2581 Secondary hyperparathyroidism of renal origin: Secondary | ICD-10-CM | POA: Diagnosis not present

## 2017-03-24 DIAGNOSIS — Z992 Dependence on renal dialysis: Secondary | ICD-10-CM | POA: Diagnosis not present

## 2017-03-26 DIAGNOSIS — N2581 Secondary hyperparathyroidism of renal origin: Secondary | ICD-10-CM | POA: Diagnosis not present

## 2017-03-26 DIAGNOSIS — N186 End stage renal disease: Secondary | ICD-10-CM | POA: Diagnosis not present

## 2017-03-26 DIAGNOSIS — D509 Iron deficiency anemia, unspecified: Secondary | ICD-10-CM | POA: Diagnosis not present

## 2017-03-26 DIAGNOSIS — Z992 Dependence on renal dialysis: Secondary | ICD-10-CM | POA: Diagnosis not present

## 2017-03-29 DIAGNOSIS — N2581 Secondary hyperparathyroidism of renal origin: Secondary | ICD-10-CM | POA: Diagnosis not present

## 2017-03-29 DIAGNOSIS — Z992 Dependence on renal dialysis: Secondary | ICD-10-CM | POA: Diagnosis not present

## 2017-03-29 DIAGNOSIS — D509 Iron deficiency anemia, unspecified: Secondary | ICD-10-CM | POA: Diagnosis not present

## 2017-03-29 DIAGNOSIS — N186 End stage renal disease: Secondary | ICD-10-CM | POA: Diagnosis not present

## 2017-03-30 ENCOUNTER — Ambulatory Visit (INDEPENDENT_AMBULATORY_CARE_PROVIDER_SITE_OTHER): Payer: Medicare Other | Admitting: Internal Medicine

## 2017-03-30 ENCOUNTER — Encounter: Payer: Self-pay | Admitting: Internal Medicine

## 2017-03-30 VITALS — BP 184/86 | HR 75 | Ht 72.0 in | Wt 235.0 lb

## 2017-03-30 DIAGNOSIS — I428 Other cardiomyopathies: Secondary | ICD-10-CM

## 2017-03-30 DIAGNOSIS — I10 Essential (primary) hypertension: Secondary | ICD-10-CM

## 2017-03-30 DIAGNOSIS — E782 Mixed hyperlipidemia: Secondary | ICD-10-CM | POA: Diagnosis not present

## 2017-03-30 MED ORDER — HYDRALAZINE HCL 25 MG PO TABS: 25.0000 mg | ORAL_TABLET | Freq: Three times a day (TID) | ORAL | 3 refills | Status: DC

## 2017-03-30 NOTE — Progress Notes (Signed)
Cardiology Office Note   Date:  03/30/2017   ID:  [name] Pick., DOB [birth year], MRN [id number]  PCP:  [name], MD  Cardiologist:   Dorris Carnes, MD   New  Pt for f/u of CHF        History of Present Illness: [name] Widmer. is a 61 y.o. male with a history of DM (with retinopay, PVOD (s/p bilaeral BKA, ESRD  (on HD), HL, HTN, NICM,   He was followed by T Wall in past  Last seen in 2012  Also by Beckie Salts  Last seen 2012  Hx if ICD (guidant Vitality single chamber the replace with [employer] ICD  Check by rep on Apr 08 2015  5 episodes of NSVT  No defib given  Battery OK ) Echo in March 2018 LVEF 35 to 40% with akinesis of base/mid anteroseptum and inferoseptum   Pt has been on dialysis this year  Had problems with fistula clotting  Patient says there is a concern for ICD leading to probem with Breating is OK   No CP      LHC in 2004  Normal coronary arteries.  L dominant  LVEF 25%    Lpids in July 2018 LDL 38  HDL 34  Trig 135     Current Meds  Medication Sig  . acetaminophen (TYLENOL) 500 MG tablet Take 1,000 mg by mouth every 8 (eight) hours as needed for mild pain or headache.   Marland Kitchen aspirin 81 MG tablet Take 81 mg by mouth daily.    . brimonidine (ALPHAGAN) 0.2 % ophthalmic solution Place 1 drop into the right eye 3 (three) times daily.   . calcium acetate (PHOSLO) 667 MG capsule Take 667-1,334 mg by mouth See admin instructions. 2 caps three times daily with meals, and 1 capsule with snacks  . carvedilol (COREG) 25 MG tablet Take 1 tablet (25 mg total) by mouth 2 (two) times daily.  . cloNIDine (CATAPRES) 0.1 MG tablet Take 1 tablet (0.1 mg total) by mouth at bedtime. (Patient taking differently: Take 0.1 mg by mouth 3 (three) times daily. )  . clopidogrel (PLAVIX) 75 MG tablet Take 75 mg by mouth daily.    . fenofibrate 160 MG tablet Take 160 mg by mouth daily.  . fluticasone (FLONASE) 50 MCG/ACT nasal spray Place 1 spray into both nostrils daily.  . folic acid  (FOLVITE) 1 MG tablet Take 1 mg by mouth daily.  . furosemide (LASIX) 20 MG tablet Take 60 mg by mouth See admin instructions. Take 3 tabs twice daily on NON DIALYSIS DAYS  . Insulin Glargine (LANTUS SOLOSTAR) 100 UNIT/ML Solostar Pen Inject 35 Units into the skin daily at 10 pm.  . insulin lispro (HUMALOG KWIKPEN) 100 UNIT/ML KiwkPen Inject into the skin 3 (three) times daily. Give per sliding scale   . latanoprost (XALATAN) 0.005 % ophthalmic solution Place 1 drop into the right eye at bedtime.  Marland Kitchen ofloxacin (OCUFLOX) 0.3 % ophthalmic solution Place 1 drop into the right eye 2 (two) times daily.  . prednisoLONE acetate (PRED FORTE) 1 % ophthalmic suspension Place 1 drop into the right eye every 2 (two) hours while awake.  . rosuvastatin (CRESTOR) 20 MG tablet Take 20 mg by mouth daily.  . timolol (TIMOPTIC) 0.5 % ophthalmic solution Place 1 drop into the right eye 2 (two) times daily.  . traMADol (ULTRAM) 50 MG tablet Take 1 tablet (50 mg total) by mouth every 6 (six) hours  as needed.     Allergies:   Acetazolamide; Contrast media [iodinated diagnostic agents]; and Other   Past Medical History:  Diagnosis Date  . AICD (automatic cardioverter/defibrillator) present   . Anemia   . Arthritis   . Cerebrovascular disease   . CHF (congestive heart failure) ([place])   . Chronic kidney disease    ESRD, MWF HD  . Diabetes mellitus    type II  . History of TIAs   . Hyperlipidemia   . Hypertension   . Nonischemic cardiomyopathy ([place])   . PVD (peripheral vascular disease) ([place])    s/p B BKA  . Shortness of breath dyspnea    with exertion   . Skin disease    Rare  . Stroke [facility])    "light stroke"  . Tobacco abuse     Past Surgical History:  Procedure Laterality Date  . A/V FISTULAGRAM Left 06/24/2016   Procedure: A/V Fistulagram;  Surgeon: [name] Dutch, MD;  Location: [facility];  Service: Cardiovascular;  Laterality: Left;  . A/V FISTULAGRAM N/A 01/17/2017   Procedure: A/V  Fistulagram - left arm;  Surgeon: Serafina Mitchell, MD;  Location: [facility];  Service: Cardiovascular;  Laterality: N/A;  . AV FISTULA PLACEMENT Left 04/08/2015   Procedure: Creation of Left arm BRACHIOCEPHALIC ARTERIOVENOUS  FISTULA ;  Surgeon: Mal Misty, MD;  Location: [place] [place];  Service: Vascular;  Laterality: Left;  . CARDIAC DEFIBRILLATOR PLACEMENT    . CARDIAC DEFIBRILLATOR PLACEMENT    . CATARACT EXTRACTION W/PHACO Right 03/17/2015   Procedure: CATARACT EXTRACTION PHACO AND INTRAOCULAR LENS PLACEMENT (IOC);  Surgeon: Rutherford Guys, MD;  Location: [facility];  Service: Ophthalmology;  Laterality: Right;  CDE: 6.59  . EYE SURGERY Right    Cataract  . LEG AMPUTATION BELOW KNEE     bilateral  . PERIPHERAL VASCULAR INTERVENTION  01/17/2017   Procedure: PERIPHERAL VASCULAR INTERVENTION;  Surgeon: Serafina Mitchell, MD;  Location: [place] [facility];  Service: Cardiovascular;;  PTA  left arm fistula     Social History:  The patient  reports that he quit smoking about 19 years ago. He has a 20.00 pack-year smoking history. he has never used smokeless tobacco. He reports that he drinks alcohol. He reports that he does not use drugs.   Family History:  The patient's family history includes Coronary artery disease in his other; Diabetes in his brother, brother, brother, brother, father, mother, and other; Heart failure in his mother; Stroke in his father.    ROS:  Please see the history of present illness. All other systems are reviewed and  Negative to the above problem except as noted.    PHYSICAL EXAM: VS:  BP (!) 180/88   Pulse 75   Ht 6' (1.829 m)   Wt 235 lb (106.6 kg)   SpO2 96%   BMI 31.87 kg/m   GEN: Well nourished, well developed, in no acute distress   Examined in chair HEENT: normal  Neck: no JVD, carotid bruits, or masses Cardiac: RRR; no murmurs, rubs, or gallops,no edema  Respiratory:  clear to auscultation bilaterally, normal work of breathing GI: soft, nontender,  nondistended, + BS  No hepatomegaly  MS: s/p bilateral BKA   Skin: warm and dry  Rash on L arm   Neuro:  Strength and sensation are intact Psych: euthymic mood, full affect   EKG:  EKG is  Not ordered today.  In March SR 66 bpm  Lipid Panel    Component Value Date/Time   CHOL 99 10/21/2016 0704   TRIG 135 10/21/2016 0704   HDL 34 (L) 10/21/2016 0704   CHOLHDL 2.9 10/21/2016 0704   VLDL 27 10/21/2016 0704   LDLCALC 38 10/21/2016 0704      Wt Readings from Last 3 Encounters:  03/30/17 235 lb (106.6 kg)  01/17/17 230 lb (104.3 kg)  09/26/16 232 lb (105.2 kg)      ASSESSMENT AND PLAN:  1  NICM   Felt due to HTN  Will add hydralazine to regimem   Volume controlled by dialysis  Continue Coreg Has ICD  Concern that interfering with flow of fistual  Will review with renal and EP   Pt needs device f/u  Will set up for device clinic    2  HTN  BP is high today  I would add hydralazine 25 tid on off dialysiss days  Review with Dr Lowanda Foster    Continue clonidine   WIll need t obe followe  3  HL  Excellent control  4  PVOD  S/p BKA  Continue AA  5  DM  On insulin      Current medicines are reviewed at length with the patient today.  The patient does not have concerns regarding medicines.  Signed, Dorris Carnes, MD  03/30/2017 9:41 AM    [address], [place], [place]  [address] Phone: [phone]; Fax: [phone]

## 2017-03-30 NOTE — Patient Instructions (Addendum)
Medication Instructions:  Your physician recommends that you continue on your current medications as directed. Please refer to the Current Medication list given to you today. Start Hydralazine 25 mg Three Times Daily Take on Off dialysis days   Labwork: NONE   Testing/Procedures: NONE   Follow-Up: Your physician recommends that you schedule a follow-up appointment in: Dr. Lovena Le  Your physician recommends that you schedule a follow-up appointment in: [facility]    Any Other Special Instructions Will Be Listed Below (If Applicable).     If you need a refill on your cardiac medications before your next appointment, please call your pharmacy.  Thank you for choosing [facility]!

## 2017-03-31 DIAGNOSIS — I5043 Acute on chronic combined systolic (congestive) and diastolic (congestive) heart failure: Secondary | ICD-10-CM | POA: Diagnosis not present

## 2017-03-31 DIAGNOSIS — Z992 Dependence on renal dialysis: Secondary | ICD-10-CM | POA: Diagnosis not present

## 2017-03-31 DIAGNOSIS — D509 Iron deficiency anemia, unspecified: Secondary | ICD-10-CM | POA: Diagnosis not present

## 2017-03-31 DIAGNOSIS — Z89519 Acquired absence of unspecified leg below knee: Secondary | ICD-10-CM | POA: Diagnosis not present

## 2017-03-31 DIAGNOSIS — N2581 Secondary hyperparathyroidism of renal origin: Secondary | ICD-10-CM | POA: Diagnosis not present

## 2017-03-31 DIAGNOSIS — N186 End stage renal disease: Secondary | ICD-10-CM | POA: Diagnosis not present

## 2017-04-03 DIAGNOSIS — Z992 Dependence on renal dialysis: Secondary | ICD-10-CM | POA: Diagnosis not present

## 2017-04-03 DIAGNOSIS — D509 Iron deficiency anemia, unspecified: Secondary | ICD-10-CM | POA: Diagnosis not present

## 2017-04-03 DIAGNOSIS — N2581 Secondary hyperparathyroidism of renal origin: Secondary | ICD-10-CM | POA: Diagnosis not present

## 2017-04-03 DIAGNOSIS — N186 End stage renal disease: Secondary | ICD-10-CM | POA: Diagnosis not present

## 2017-04-05 DIAGNOSIS — N186 End stage renal disease: Secondary | ICD-10-CM | POA: Diagnosis not present

## 2017-04-05 DIAGNOSIS — D631 Anemia in chronic kidney disease: Secondary | ICD-10-CM | POA: Diagnosis not present

## 2017-04-05 DIAGNOSIS — D509 Iron deficiency anemia, unspecified: Secondary | ICD-10-CM | POA: Diagnosis not present

## 2017-04-05 DIAGNOSIS — Z992 Dependence on renal dialysis: Secondary | ICD-10-CM | POA: Diagnosis not present

## 2017-04-05 DIAGNOSIS — N2581 Secondary hyperparathyroidism of renal origin: Secondary | ICD-10-CM | POA: Diagnosis not present

## 2017-04-07 DIAGNOSIS — D509 Iron deficiency anemia, unspecified: Secondary | ICD-10-CM | POA: Diagnosis not present

## 2017-04-07 DIAGNOSIS — N2581 Secondary hyperparathyroidism of renal origin: Secondary | ICD-10-CM | POA: Diagnosis not present

## 2017-04-07 DIAGNOSIS — D631 Anemia in chronic kidney disease: Secondary | ICD-10-CM | POA: Diagnosis not present

## 2017-04-07 DIAGNOSIS — Z992 Dependence on renal dialysis: Secondary | ICD-10-CM | POA: Diagnosis not present

## 2017-04-07 DIAGNOSIS — N186 End stage renal disease: Secondary | ICD-10-CM | POA: Diagnosis not present

## 2017-04-10 DIAGNOSIS — N2581 Secondary hyperparathyroidism of renal origin: Secondary | ICD-10-CM | POA: Diagnosis not present

## 2017-04-10 DIAGNOSIS — N186 End stage renal disease: Secondary | ICD-10-CM | POA: Diagnosis not present

## 2017-04-10 DIAGNOSIS — Z992 Dependence on renal dialysis: Secondary | ICD-10-CM | POA: Diagnosis not present

## 2017-04-10 DIAGNOSIS — D631 Anemia in chronic kidney disease: Secondary | ICD-10-CM | POA: Diagnosis not present

## 2017-04-10 DIAGNOSIS — D509 Iron deficiency anemia, unspecified: Secondary | ICD-10-CM | POA: Diagnosis not present

## 2017-04-12 DIAGNOSIS — N2581 Secondary hyperparathyroidism of renal origin: Secondary | ICD-10-CM | POA: Diagnosis not present

## 2017-04-12 DIAGNOSIS — D509 Iron deficiency anemia, unspecified: Secondary | ICD-10-CM | POA: Diagnosis not present

## 2017-04-12 DIAGNOSIS — N186 End stage renal disease: Secondary | ICD-10-CM | POA: Diagnosis not present

## 2017-04-12 DIAGNOSIS — D631 Anemia in chronic kidney disease: Secondary | ICD-10-CM | POA: Diagnosis not present

## 2017-04-12 DIAGNOSIS — Z992 Dependence on renal dialysis: Secondary | ICD-10-CM | POA: Diagnosis not present

## 2017-04-14 DIAGNOSIS — D631 Anemia in chronic kidney disease: Secondary | ICD-10-CM | POA: Diagnosis not present

## 2017-04-14 DIAGNOSIS — D509 Iron deficiency anemia, unspecified: Secondary | ICD-10-CM | POA: Diagnosis not present

## 2017-04-14 DIAGNOSIS — N2581 Secondary hyperparathyroidism of renal origin: Secondary | ICD-10-CM | POA: Diagnosis not present

## 2017-04-14 DIAGNOSIS — Z992 Dependence on renal dialysis: Secondary | ICD-10-CM | POA: Diagnosis not present

## 2017-04-14 DIAGNOSIS — N186 End stage renal disease: Secondary | ICD-10-CM | POA: Diagnosis not present

## 2017-04-17 DIAGNOSIS — N186 End stage renal disease: Secondary | ICD-10-CM | POA: Diagnosis not present

## 2017-04-17 DIAGNOSIS — Z992 Dependence on renal dialysis: Secondary | ICD-10-CM | POA: Diagnosis not present

## 2017-04-17 DIAGNOSIS — D509 Iron deficiency anemia, unspecified: Secondary | ICD-10-CM | POA: Diagnosis not present

## 2017-04-17 DIAGNOSIS — N2581 Secondary hyperparathyroidism of renal origin: Secondary | ICD-10-CM | POA: Diagnosis not present

## 2017-04-17 DIAGNOSIS — D631 Anemia in chronic kidney disease: Secondary | ICD-10-CM | POA: Diagnosis not present

## 2017-04-19 DIAGNOSIS — D631 Anemia in chronic kidney disease: Secondary | ICD-10-CM | POA: Diagnosis not present

## 2017-04-19 DIAGNOSIS — D509 Iron deficiency anemia, unspecified: Secondary | ICD-10-CM | POA: Diagnosis not present

## 2017-04-19 DIAGNOSIS — Z992 Dependence on renal dialysis: Secondary | ICD-10-CM | POA: Diagnosis not present

## 2017-04-19 DIAGNOSIS — N2581 Secondary hyperparathyroidism of renal origin: Secondary | ICD-10-CM | POA: Diagnosis not present

## 2017-04-19 DIAGNOSIS — N186 End stage renal disease: Secondary | ICD-10-CM | POA: Diagnosis not present

## 2017-04-20 ENCOUNTER — Ambulatory Visit (INDEPENDENT_AMBULATORY_CARE_PROVIDER_SITE_OTHER): Payer: Medicare Other | Admitting: *Deleted

## 2017-04-20 DIAGNOSIS — I428 Other cardiomyopathies: Secondary | ICD-10-CM | POA: Diagnosis not present

## 2017-04-20 DIAGNOSIS — Z95810 Presence of automatic (implantable) cardiac defibrillator: Secondary | ICD-10-CM | POA: Diagnosis not present

## 2017-04-20 LAB — CUP PACEART INCLINIC DEVICE CHECK
Date Time Interrogation Session: 20190117050000
HIGH POWER IMPEDANCE MEASURED VALUE: 46 Ohm
HighPow Impedance: 34 Ohm
Lead Channel Impedance Value: 491 Ohm
Lead Channel Pacing Threshold Amplitude: 0.7 V
Lead Channel Sensing Intrinsic Amplitude: 25 mV
Lead Channel Setting Pacing Amplitude: 2.5 V
Lead Channel Setting Pacing Pulse Width: 0.4 ms
MDC IDC LEAD IMPLANT DT: 20050810
MDC IDC LEAD LOCATION: 753860
MDC IDC LEAD SERIAL: 153491
MDC IDC MSMT LEADCHNL RV PACING THRESHOLD PULSEWIDTH: 0.4 ms
MDC IDC PG IMPLANT DT: 20110722
MDC IDC SET LEADCHNL RV SENSING SENSITIVITY: 0.4 mV
MDC IDC STAT BRADY RV PERCENT PACED: 1 % — AB
Pulse Gen Serial Number: 269222

## 2017-04-20 NOTE — Progress Notes (Signed)
ICD check in clinic. Normal device function. Thresholds and sensing consistent with previous device measurements. Impedance trends stable over time. 2 monitored NSVT episodes--ECGs suggest noise artifact, date correlates with LUA AV fistula placement surgery. Histogram distribution appropriate for patient and level of activity. No changes made this session. Device programmed at appropriate safety margins. Device programmed to optimize intrinsic conduction. Estimated longevity 6.5 years. Patient education completed including shock plan. Patient enrolled in remote follow-up, but thinks he is missing a piece of his monitor. He will bring his monitor to upcoming appointment with GT/R on 06/22/17.

## 2017-04-21 DIAGNOSIS — D509 Iron deficiency anemia, unspecified: Secondary | ICD-10-CM | POA: Diagnosis not present

## 2017-04-21 DIAGNOSIS — N186 End stage renal disease: Secondary | ICD-10-CM | POA: Diagnosis not present

## 2017-04-21 DIAGNOSIS — N2581 Secondary hyperparathyroidism of renal origin: Secondary | ICD-10-CM | POA: Diagnosis not present

## 2017-04-21 DIAGNOSIS — D631 Anemia in chronic kidney disease: Secondary | ICD-10-CM | POA: Diagnosis not present

## 2017-04-21 DIAGNOSIS — Z992 Dependence on renal dialysis: Secondary | ICD-10-CM | POA: Diagnosis not present

## 2017-04-24 DIAGNOSIS — D509 Iron deficiency anemia, unspecified: Secondary | ICD-10-CM | POA: Diagnosis not present

## 2017-04-24 DIAGNOSIS — N2581 Secondary hyperparathyroidism of renal origin: Secondary | ICD-10-CM | POA: Diagnosis not present

## 2017-04-24 DIAGNOSIS — N186 End stage renal disease: Secondary | ICD-10-CM | POA: Diagnosis not present

## 2017-04-24 DIAGNOSIS — Z992 Dependence on renal dialysis: Secondary | ICD-10-CM | POA: Diagnosis not present

## 2017-04-24 DIAGNOSIS — D631 Anemia in chronic kidney disease: Secondary | ICD-10-CM | POA: Diagnosis not present

## 2017-04-26 DIAGNOSIS — N2581 Secondary hyperparathyroidism of renal origin: Secondary | ICD-10-CM | POA: Diagnosis not present

## 2017-04-26 DIAGNOSIS — D631 Anemia in chronic kidney disease: Secondary | ICD-10-CM | POA: Diagnosis not present

## 2017-04-26 DIAGNOSIS — Z992 Dependence on renal dialysis: Secondary | ICD-10-CM | POA: Diagnosis not present

## 2017-04-26 DIAGNOSIS — D509 Iron deficiency anemia, unspecified: Secondary | ICD-10-CM | POA: Diagnosis not present

## 2017-04-26 DIAGNOSIS — N186 End stage renal disease: Secondary | ICD-10-CM | POA: Diagnosis not present

## 2017-04-28 DIAGNOSIS — N2581 Secondary hyperparathyroidism of renal origin: Secondary | ICD-10-CM | POA: Diagnosis not present

## 2017-04-28 DIAGNOSIS — D631 Anemia in chronic kidney disease: Secondary | ICD-10-CM | POA: Diagnosis not present

## 2017-04-28 DIAGNOSIS — D509 Iron deficiency anemia, unspecified: Secondary | ICD-10-CM | POA: Diagnosis not present

## 2017-04-28 DIAGNOSIS — Z992 Dependence on renal dialysis: Secondary | ICD-10-CM | POA: Diagnosis not present

## 2017-04-28 DIAGNOSIS — N186 End stage renal disease: Secondary | ICD-10-CM | POA: Diagnosis not present

## 2017-05-01 DIAGNOSIS — N186 End stage renal disease: Secondary | ICD-10-CM | POA: Diagnosis not present

## 2017-05-01 DIAGNOSIS — D509 Iron deficiency anemia, unspecified: Secondary | ICD-10-CM | POA: Diagnosis not present

## 2017-05-01 DIAGNOSIS — N2581 Secondary hyperparathyroidism of renal origin: Secondary | ICD-10-CM | POA: Diagnosis not present

## 2017-05-01 DIAGNOSIS — Z992 Dependence on renal dialysis: Secondary | ICD-10-CM | POA: Diagnosis not present

## 2017-05-01 DIAGNOSIS — D631 Anemia in chronic kidney disease: Secondary | ICD-10-CM | POA: Diagnosis not present

## 2017-05-03 DIAGNOSIS — D631 Anemia in chronic kidney disease: Secondary | ICD-10-CM | POA: Diagnosis not present

## 2017-05-03 DIAGNOSIS — Z992 Dependence on renal dialysis: Secondary | ICD-10-CM | POA: Diagnosis not present

## 2017-05-03 DIAGNOSIS — N186 End stage renal disease: Secondary | ICD-10-CM | POA: Diagnosis not present

## 2017-05-03 DIAGNOSIS — D509 Iron deficiency anemia, unspecified: Secondary | ICD-10-CM | POA: Diagnosis not present

## 2017-05-03 DIAGNOSIS — N2581 Secondary hyperparathyroidism of renal origin: Secondary | ICD-10-CM | POA: Diagnosis not present

## 2017-05-04 DIAGNOSIS — Z992 Dependence on renal dialysis: Secondary | ICD-10-CM | POA: Diagnosis not present

## 2017-05-04 DIAGNOSIS — N186 End stage renal disease: Secondary | ICD-10-CM | POA: Diagnosis not present

## 2017-05-04 DIAGNOSIS — E119 Type 2 diabetes mellitus without complications: Secondary | ICD-10-CM | POA: Diagnosis not present

## 2017-05-05 DIAGNOSIS — Z992 Dependence on renal dialysis: Secondary | ICD-10-CM | POA: Diagnosis not present

## 2017-05-05 DIAGNOSIS — D509 Iron deficiency anemia, unspecified: Secondary | ICD-10-CM | POA: Diagnosis not present

## 2017-05-05 DIAGNOSIS — N2581 Secondary hyperparathyroidism of renal origin: Secondary | ICD-10-CM | POA: Diagnosis not present

## 2017-05-05 DIAGNOSIS — N186 End stage renal disease: Secondary | ICD-10-CM | POA: Diagnosis not present

## 2017-05-05 DIAGNOSIS — D631 Anemia in chronic kidney disease: Secondary | ICD-10-CM | POA: Diagnosis not present

## 2017-05-08 DIAGNOSIS — N186 End stage renal disease: Secondary | ICD-10-CM | POA: Diagnosis not present

## 2017-05-08 DIAGNOSIS — D509 Iron deficiency anemia, unspecified: Secondary | ICD-10-CM | POA: Diagnosis not present

## 2017-05-08 DIAGNOSIS — D631 Anemia in chronic kidney disease: Secondary | ICD-10-CM | POA: Diagnosis not present

## 2017-05-08 DIAGNOSIS — N2581 Secondary hyperparathyroidism of renal origin: Secondary | ICD-10-CM | POA: Diagnosis not present

## 2017-05-08 DIAGNOSIS — Z992 Dependence on renal dialysis: Secondary | ICD-10-CM | POA: Diagnosis not present

## 2017-05-09 DIAGNOSIS — E782 Mixed hyperlipidemia: Secondary | ICD-10-CM | POA: Diagnosis not present

## 2017-05-09 DIAGNOSIS — E1165 Type 2 diabetes mellitus with hyperglycemia: Secondary | ICD-10-CM | POA: Diagnosis not present

## 2017-05-09 DIAGNOSIS — I10 Essential (primary) hypertension: Secondary | ICD-10-CM | POA: Diagnosis not present

## 2017-05-09 DIAGNOSIS — N186 End stage renal disease: Secondary | ICD-10-CM | POA: Diagnosis not present

## 2017-05-10 DIAGNOSIS — D631 Anemia in chronic kidney disease: Secondary | ICD-10-CM | POA: Diagnosis not present

## 2017-05-10 DIAGNOSIS — N2581 Secondary hyperparathyroidism of renal origin: Secondary | ICD-10-CM | POA: Diagnosis not present

## 2017-05-10 DIAGNOSIS — N186 End stage renal disease: Secondary | ICD-10-CM | POA: Diagnosis not present

## 2017-05-10 DIAGNOSIS — Z992 Dependence on renal dialysis: Secondary | ICD-10-CM | POA: Diagnosis not present

## 2017-05-10 DIAGNOSIS — D509 Iron deficiency anemia, unspecified: Secondary | ICD-10-CM | POA: Diagnosis not present

## 2017-05-12 DIAGNOSIS — N186 End stage renal disease: Secondary | ICD-10-CM | POA: Diagnosis not present

## 2017-05-12 DIAGNOSIS — N2581 Secondary hyperparathyroidism of renal origin: Secondary | ICD-10-CM | POA: Diagnosis not present

## 2017-05-12 DIAGNOSIS — D631 Anemia in chronic kidney disease: Secondary | ICD-10-CM | POA: Diagnosis not present

## 2017-05-12 DIAGNOSIS — Z992 Dependence on renal dialysis: Secondary | ICD-10-CM | POA: Diagnosis not present

## 2017-05-12 DIAGNOSIS — D509 Iron deficiency anemia, unspecified: Secondary | ICD-10-CM | POA: Diagnosis not present

## 2017-05-15 DIAGNOSIS — N2581 Secondary hyperparathyroidism of renal origin: Secondary | ICD-10-CM | POA: Diagnosis not present

## 2017-05-15 DIAGNOSIS — N186 End stage renal disease: Secondary | ICD-10-CM | POA: Diagnosis not present

## 2017-05-15 DIAGNOSIS — D509 Iron deficiency anemia, unspecified: Secondary | ICD-10-CM | POA: Diagnosis not present

## 2017-05-15 DIAGNOSIS — D631 Anemia in chronic kidney disease: Secondary | ICD-10-CM | POA: Diagnosis not present

## 2017-05-15 DIAGNOSIS — Z992 Dependence on renal dialysis: Secondary | ICD-10-CM | POA: Diagnosis not present

## 2017-05-17 DIAGNOSIS — N2581 Secondary hyperparathyroidism of renal origin: Secondary | ICD-10-CM | POA: Diagnosis not present

## 2017-05-17 DIAGNOSIS — N186 End stage renal disease: Secondary | ICD-10-CM | POA: Diagnosis not present

## 2017-05-17 DIAGNOSIS — Z992 Dependence on renal dialysis: Secondary | ICD-10-CM | POA: Diagnosis not present

## 2017-05-17 DIAGNOSIS — D631 Anemia in chronic kidney disease: Secondary | ICD-10-CM | POA: Diagnosis not present

## 2017-05-17 DIAGNOSIS — D509 Iron deficiency anemia, unspecified: Secondary | ICD-10-CM | POA: Diagnosis not present

## 2017-05-19 DIAGNOSIS — N2581 Secondary hyperparathyroidism of renal origin: Secondary | ICD-10-CM | POA: Diagnosis not present

## 2017-05-19 DIAGNOSIS — Z992 Dependence on renal dialysis: Secondary | ICD-10-CM | POA: Diagnosis not present

## 2017-05-19 DIAGNOSIS — D631 Anemia in chronic kidney disease: Secondary | ICD-10-CM | POA: Diagnosis not present

## 2017-05-19 DIAGNOSIS — D509 Iron deficiency anemia, unspecified: Secondary | ICD-10-CM | POA: Diagnosis not present

## 2017-05-19 DIAGNOSIS — N186 End stage renal disease: Secondary | ICD-10-CM | POA: Diagnosis not present

## 2017-05-22 DIAGNOSIS — Z992 Dependence on renal dialysis: Secondary | ICD-10-CM | POA: Diagnosis not present

## 2017-05-22 DIAGNOSIS — D509 Iron deficiency anemia, unspecified: Secondary | ICD-10-CM | POA: Diagnosis not present

## 2017-05-22 DIAGNOSIS — N2581 Secondary hyperparathyroidism of renal origin: Secondary | ICD-10-CM | POA: Diagnosis not present

## 2017-05-22 DIAGNOSIS — D631 Anemia in chronic kidney disease: Secondary | ICD-10-CM | POA: Diagnosis not present

## 2017-05-22 DIAGNOSIS — N186 End stage renal disease: Secondary | ICD-10-CM | POA: Diagnosis not present

## 2017-05-24 DIAGNOSIS — D631 Anemia in chronic kidney disease: Secondary | ICD-10-CM | POA: Diagnosis not present

## 2017-05-24 DIAGNOSIS — N2581 Secondary hyperparathyroidism of renal origin: Secondary | ICD-10-CM | POA: Diagnosis not present

## 2017-05-24 DIAGNOSIS — Z992 Dependence on renal dialysis: Secondary | ICD-10-CM | POA: Diagnosis not present

## 2017-05-24 DIAGNOSIS — D509 Iron deficiency anemia, unspecified: Secondary | ICD-10-CM | POA: Diagnosis not present

## 2017-05-24 DIAGNOSIS — N186 End stage renal disease: Secondary | ICD-10-CM | POA: Diagnosis not present

## 2017-05-26 DIAGNOSIS — D509 Iron deficiency anemia, unspecified: Secondary | ICD-10-CM | POA: Diagnosis not present

## 2017-05-26 DIAGNOSIS — Z992 Dependence on renal dialysis: Secondary | ICD-10-CM | POA: Diagnosis not present

## 2017-05-26 DIAGNOSIS — D631 Anemia in chronic kidney disease: Secondary | ICD-10-CM | POA: Diagnosis not present

## 2017-05-26 DIAGNOSIS — N186 End stage renal disease: Secondary | ICD-10-CM | POA: Diagnosis not present

## 2017-05-26 DIAGNOSIS — N2581 Secondary hyperparathyroidism of renal origin: Secondary | ICD-10-CM | POA: Diagnosis not present

## 2017-05-29 DIAGNOSIS — Z992 Dependence on renal dialysis: Secondary | ICD-10-CM | POA: Diagnosis not present

## 2017-05-29 DIAGNOSIS — N2581 Secondary hyperparathyroidism of renal origin: Secondary | ICD-10-CM | POA: Diagnosis not present

## 2017-05-29 DIAGNOSIS — N186 End stage renal disease: Secondary | ICD-10-CM | POA: Diagnosis not present

## 2017-05-29 DIAGNOSIS — D509 Iron deficiency anemia, unspecified: Secondary | ICD-10-CM | POA: Diagnosis not present

## 2017-05-29 DIAGNOSIS — D631 Anemia in chronic kidney disease: Secondary | ICD-10-CM | POA: Diagnosis not present

## 2017-05-31 DIAGNOSIS — D509 Iron deficiency anemia, unspecified: Secondary | ICD-10-CM | POA: Diagnosis not present

## 2017-05-31 DIAGNOSIS — Z992 Dependence on renal dialysis: Secondary | ICD-10-CM | POA: Diagnosis not present

## 2017-05-31 DIAGNOSIS — D631 Anemia in chronic kidney disease: Secondary | ICD-10-CM | POA: Diagnosis not present

## 2017-05-31 DIAGNOSIS — N186 End stage renal disease: Secondary | ICD-10-CM | POA: Diagnosis not present

## 2017-05-31 DIAGNOSIS — N2581 Secondary hyperparathyroidism of renal origin: Secondary | ICD-10-CM | POA: Diagnosis not present

## 2017-06-01 DIAGNOSIS — Z992 Dependence on renal dialysis: Secondary | ICD-10-CM | POA: Diagnosis not present

## 2017-06-01 DIAGNOSIS — N186 End stage renal disease: Secondary | ICD-10-CM | POA: Diagnosis not present

## 2017-06-02 DIAGNOSIS — D631 Anemia in chronic kidney disease: Secondary | ICD-10-CM | POA: Diagnosis not present

## 2017-06-02 DIAGNOSIS — D509 Iron deficiency anemia, unspecified: Secondary | ICD-10-CM | POA: Diagnosis not present

## 2017-06-02 DIAGNOSIS — N2581 Secondary hyperparathyroidism of renal origin: Secondary | ICD-10-CM | POA: Diagnosis not present

## 2017-06-02 DIAGNOSIS — N186 End stage renal disease: Secondary | ICD-10-CM | POA: Diagnosis not present

## 2017-06-02 DIAGNOSIS — Z992 Dependence on renal dialysis: Secondary | ICD-10-CM | POA: Diagnosis not present

## 2017-06-05 DIAGNOSIS — D631 Anemia in chronic kidney disease: Secondary | ICD-10-CM | POA: Diagnosis not present

## 2017-06-05 DIAGNOSIS — N2581 Secondary hyperparathyroidism of renal origin: Secondary | ICD-10-CM | POA: Diagnosis not present

## 2017-06-05 DIAGNOSIS — Z992 Dependence on renal dialysis: Secondary | ICD-10-CM | POA: Diagnosis not present

## 2017-06-05 DIAGNOSIS — N186 End stage renal disease: Secondary | ICD-10-CM | POA: Diagnosis not present

## 2017-06-05 DIAGNOSIS — D509 Iron deficiency anemia, unspecified: Secondary | ICD-10-CM | POA: Diagnosis not present

## 2017-06-07 DIAGNOSIS — N186 End stage renal disease: Secondary | ICD-10-CM | POA: Diagnosis not present

## 2017-06-07 DIAGNOSIS — D509 Iron deficiency anemia, unspecified: Secondary | ICD-10-CM | POA: Diagnosis not present

## 2017-06-07 DIAGNOSIS — D631 Anemia in chronic kidney disease: Secondary | ICD-10-CM | POA: Diagnosis not present

## 2017-06-07 DIAGNOSIS — Z992 Dependence on renal dialysis: Secondary | ICD-10-CM | POA: Diagnosis not present

## 2017-06-07 DIAGNOSIS — N2581 Secondary hyperparathyroidism of renal origin: Secondary | ICD-10-CM | POA: Diagnosis not present

## 2017-06-09 DIAGNOSIS — N2581 Secondary hyperparathyroidism of renal origin: Secondary | ICD-10-CM | POA: Diagnosis not present

## 2017-06-09 DIAGNOSIS — Z992 Dependence on renal dialysis: Secondary | ICD-10-CM | POA: Diagnosis not present

## 2017-06-09 DIAGNOSIS — N186 End stage renal disease: Secondary | ICD-10-CM | POA: Diagnosis not present

## 2017-06-09 DIAGNOSIS — D509 Iron deficiency anemia, unspecified: Secondary | ICD-10-CM | POA: Diagnosis not present

## 2017-06-09 DIAGNOSIS — D631 Anemia in chronic kidney disease: Secondary | ICD-10-CM | POA: Diagnosis not present

## 2017-06-12 DIAGNOSIS — N2581 Secondary hyperparathyroidism of renal origin: Secondary | ICD-10-CM | POA: Diagnosis not present

## 2017-06-12 DIAGNOSIS — N186 End stage renal disease: Secondary | ICD-10-CM | POA: Diagnosis not present

## 2017-06-12 DIAGNOSIS — D631 Anemia in chronic kidney disease: Secondary | ICD-10-CM | POA: Diagnosis not present

## 2017-06-12 DIAGNOSIS — Z992 Dependence on renal dialysis: Secondary | ICD-10-CM | POA: Diagnosis not present

## 2017-06-12 DIAGNOSIS — D509 Iron deficiency anemia, unspecified: Secondary | ICD-10-CM | POA: Diagnosis not present

## 2017-06-14 DIAGNOSIS — Z992 Dependence on renal dialysis: Secondary | ICD-10-CM | POA: Diagnosis not present

## 2017-06-14 DIAGNOSIS — D631 Anemia in chronic kidney disease: Secondary | ICD-10-CM | POA: Diagnosis not present

## 2017-06-14 DIAGNOSIS — N186 End stage renal disease: Secondary | ICD-10-CM | POA: Diagnosis not present

## 2017-06-14 DIAGNOSIS — D509 Iron deficiency anemia, unspecified: Secondary | ICD-10-CM | POA: Diagnosis not present

## 2017-06-14 DIAGNOSIS — N2581 Secondary hyperparathyroidism of renal origin: Secondary | ICD-10-CM | POA: Diagnosis not present

## 2017-06-16 DIAGNOSIS — D631 Anemia in chronic kidney disease: Secondary | ICD-10-CM | POA: Diagnosis not present

## 2017-06-16 DIAGNOSIS — N186 End stage renal disease: Secondary | ICD-10-CM | POA: Diagnosis not present

## 2017-06-16 DIAGNOSIS — N2581 Secondary hyperparathyroidism of renal origin: Secondary | ICD-10-CM | POA: Diagnosis not present

## 2017-06-16 DIAGNOSIS — Z992 Dependence on renal dialysis: Secondary | ICD-10-CM | POA: Diagnosis not present

## 2017-06-16 DIAGNOSIS — D509 Iron deficiency anemia, unspecified: Secondary | ICD-10-CM | POA: Diagnosis not present

## 2017-06-19 DIAGNOSIS — D631 Anemia in chronic kidney disease: Secondary | ICD-10-CM | POA: Diagnosis not present

## 2017-06-19 DIAGNOSIS — D509 Iron deficiency anemia, unspecified: Secondary | ICD-10-CM | POA: Diagnosis not present

## 2017-06-19 DIAGNOSIS — Z992 Dependence on renal dialysis: Secondary | ICD-10-CM | POA: Diagnosis not present

## 2017-06-19 DIAGNOSIS — N186 End stage renal disease: Secondary | ICD-10-CM | POA: Diagnosis not present

## 2017-06-19 DIAGNOSIS — N2581 Secondary hyperparathyroidism of renal origin: Secondary | ICD-10-CM | POA: Diagnosis not present

## 2017-06-21 DIAGNOSIS — Z992 Dependence on renal dialysis: Secondary | ICD-10-CM | POA: Diagnosis not present

## 2017-06-21 DIAGNOSIS — D631 Anemia in chronic kidney disease: Secondary | ICD-10-CM | POA: Diagnosis not present

## 2017-06-21 DIAGNOSIS — N186 End stage renal disease: Secondary | ICD-10-CM | POA: Diagnosis not present

## 2017-06-21 DIAGNOSIS — N2581 Secondary hyperparathyroidism of renal origin: Secondary | ICD-10-CM | POA: Diagnosis not present

## 2017-06-21 DIAGNOSIS — D509 Iron deficiency anemia, unspecified: Secondary | ICD-10-CM | POA: Diagnosis not present

## 2017-06-22 ENCOUNTER — Encounter: Payer: Self-pay | Admitting: Internal Medicine

## 2017-06-22 ENCOUNTER — Ambulatory Visit (INDEPENDENT_AMBULATORY_CARE_PROVIDER_SITE_OTHER): Payer: Medicare Other | Admitting: Internal Medicine

## 2017-06-22 VITALS — BP 181/84 | HR 87 | Wt 245.0 lb

## 2017-06-22 DIAGNOSIS — I10 Essential (primary) hypertension: Secondary | ICD-10-CM

## 2017-06-22 NOTE — Patient Instructions (Signed)
Medication Instructions:  Your physician recommends that you continue on your current medications as directed. Please refer to the Current Medication list given to you today.   Labwork: NONE   Testing/Procedures: NONE   Follow-Up: Your physician recommends that you schedule a follow-up appointment in: 1 Year     Any Other Special Instructions Will Be Listed Below (If Applicable).     If you need a refill on your cardiac medications before your next appointment, please call your pharmacy.  Thank you for choosing [facility]!

## 2017-06-22 NOTE — Progress Notes (Signed)
HPI Mr. Crusoe returns today for followup of chronic systolic heart failure, uncontrolled HTN, s/p remote ICD insertion, severe peripheral vascular disease, s/p ICD insertion. In the interim, he has been stable although his pressure has been difficult to control. He has ESRD and undergoes HD M/W/F. He denies chest pain or sob. No ICD shocks. Allergies  Allergen Reactions  . Acetazolamide Other (See Comments)    Jittery odd feeling. (hyper feeling)  . Contrast Media [Iodinated Diagnostic Agents] Hives    In the 80's   . Other Other (See Comments)    Transfer Dye---"Makes me tired"     Current Outpatient Medications  Medication Sig Dispense Refill  . acetaminophen (TYLENOL) 500 MG tablet Take 1,000 mg by mouth every 8 (eight) hours as needed for mild pain or headache.     Marland Kitchen aspirin 81 MG tablet Take 81 mg by mouth daily.      . brimonidine (ALPHAGAN) 0.2 % ophthalmic solution Place 1 drop into the right eye 3 (three) times daily.   1  . calcium acetate (PHOSLO) 667 MG capsule Take 667-1,334 mg by mouth See admin instructions. 2 caps three times daily with meals, and 1 capsule with snacks  0  . carvedilol (COREG) 25 MG tablet Take 1 tablet (25 mg total) by mouth 2 (two) times daily. 60 tablet 0  . cloNIDine (CATAPRES) 0.1 MG tablet Take 1 tablet (0.1 mg total) by mouth at bedtime. (Patient taking differently: Take 0.1 mg by mouth 3 (three) times daily. )    . clopidogrel (PLAVIX) 75 MG tablet Take 75 mg by mouth daily.      . fenofibrate 160 MG tablet Take 160 mg by mouth daily.  0  . fluticasone (FLONASE) 50 MCG/ACT nasal spray Place 1 spray into both nostrils daily.    . folic acid (FOLVITE) 1 MG tablet Take 1 mg by mouth daily.    . furosemide (LASIX) 20 MG tablet Take 60 mg by mouth See admin instructions. Take 3 tabs twice daily on NON DIALYSIS DAYS    . hydrALAZINE (APRESOLINE) 25 MG tablet Take 1 tablet (25 mg total) by mouth 3 (three) times daily. 270 tablet 3  . Insulin  Glargine (LANTUS SOLOSTAR) 100 UNIT/ML Solostar Pen Inject 35 Units into the skin daily at 10 pm.    . insulin lispro (HUMALOG KWIKPEN) 100 UNIT/ML KiwkPen Inject into the skin 3 (three) times daily. Give per sliding scale     . latanoprost (XALATAN) 0.005 % ophthalmic solution Place 1 drop into the right eye at bedtime.  1  . ofloxacin (OCUFLOX) 0.3 % ophthalmic solution Place 1 drop into the right eye 2 (two) times daily.    . prednisoLONE acetate (PRED FORTE) 1 % ophthalmic suspension Place 1 drop into the right eye every 2 (two) hours while awake.    . rosuvastatin (CRESTOR) 20 MG tablet Take 20 mg by mouth daily.  0  . timolol (TIMOPTIC) 0.5 % ophthalmic solution Place 1 drop into the right eye 2 (two) times daily.  1  . amLODipine (NORVASC) 10 MG tablet Take 10 mg by mouth daily. for high blood pressure  1   No current facility-administered medications for this visit.      Past Medical History:  Diagnosis Date  . AICD (automatic cardioverter/defibrillator) present   . Anemia   . Arthritis   . Cerebrovascular disease   . CHF (congestive heart failure) ([place])   . Chronic kidney disease  ESRD, MWF HD  . Diabetes mellitus    type II  . History of TIAs   . Hyperlipidemia   . Hypertension   . Nonischemic cardiomyopathy ([place])   . PVD (peripheral vascular disease) ([place])    s/p B BKA  . Shortness of breath dyspnea    with exertion   . Skin disease    Rare  . Stroke [facility])    "light stroke"  . Tobacco abuse     ROS:   All systems reviewed and negative except as noted in the HPI.   Past Surgical History:  Procedure Laterality Date  . A/V FISTULAGRAM Left 06/24/2016   Procedure: A/V Fistulagram;  Surgeon: Elam Dutch, MD;  Location: [place] [facility];  Service: Cardiovascular;  Laterality: Left;  . A/V FISTULAGRAM N/A 01/17/2017   Procedure: A/V Fistulagram - left arm;  Surgeon: Serafina Mitchell, MD;  Location: [facility];  Service: Cardiovascular;  Laterality:  N/A;  . AV FISTULA PLACEMENT Left 04/08/2015   Procedure: Creation of Left arm BRACHIOCEPHALIC ARTERIOVENOUS  FISTULA ;  Surgeon: Mal Misty, MD;  Location: [facility];  Service: Vascular;  Laterality: Left;  . CARDIAC DEFIBRILLATOR PLACEMENT    . CARDIAC DEFIBRILLATOR PLACEMENT    . CATARACT EXTRACTION W/PHACO Right 03/17/2015   Procedure: CATARACT EXTRACTION PHACO AND INTRAOCULAR LENS PLACEMENT (IOC);  Surgeon: Rutherford Guys, MD;  [facility];  Service: Ophthalmology;  Laterality: Right;  CDE: 6.59  . EYE SURGERY Right    Cataract  . LEG AMPUTATION BELOW KNEE     bilateral  . PERIPHERAL VASCULAR INTERVENTION  01/17/2017   Procedure: PERIPHERAL VASCULAR INTERVENTION;  Surgeon: Serafina Mitchell, MD;  Location: [facility];  Service: Cardiovascular;;  PTA  left arm fistula     Family History  Problem Relation Age of Onset  . Diabetes Father   . Stroke Father   . Diabetes Brother   . Diabetes Brother   . Diabetes Brother   . Diabetes Brother   . Heart failure Mother   . Diabetes Mother   . Diabetes Other   . Coronary artery disease Other      Social History   Socioeconomic History  . Marital status: Legally Separated    Spouse name: Not on file  . Number of children: Not on file  . Years of education: Not on file  . Highest education level: Not on file  Occupational History  . Occupation: Disabled    Employer: [employer]  . Financial resource strain: Not on file  . Food insecurity:    Worry: Not on file    Inability: Not on file  . Transportation needs:    Medical: Not on file    Non-medical: Not on file  Tobacco Use  . Smoking status: Former Smoker    Packs/day: 1.00    Years: 20.00    Pack years: 20.00    Last attempt to quit: 03/12/1998    Years since quitting: 19.2  . Smokeless tobacco: Never Used  Substance and Sexual Activity  . Alcohol use: Yes    Alcohol/week: 0.0 oz    Comment: occasional  . Drug use: No  . Sexual  activity: Not on file  Lifestyle  . Physical activity:    Days per week: Not on file    Minutes per session: Not on file  . Stress: Not on file  Relationships  . Social connections:    Talks on  phone: Not on file    Gets together: Not on file    Attends religious service: Not on file    Active member of club or organization: Not on file    Attends meetings of clubs or organizations: Not on file    Relationship status: Not on file  . Intimate partner violence:    Fear of current or ex partner: Not on file    Emotionally abused: Not on file    Physically abused: Not on file    Forced sexual activity: Not on file  Other Topics Concern  . Not on file  Social History Narrative   Divorced   No regular exercise   Occasional caffeine use     BP (!) 181/84   Pulse 87   Wt 245 lb (111.1 kg) Comment: pt states  SpO2 93%   BMI 33.23 kg/m   Physical Exam:  Well appearing NAD HEENT: Unremarkable Neck:  No JVD, no thyromegally Lymphatics:  No adenopathy Back:  No CVA tenderness Lungs:  Clear HEART:  Regular rate rhythm, no murmurs, no rubs, no clicks Abd:  soft, positive bowel sounds, no organomegally, no rebound, no guarding Ext:  no edema, no cyanosis, no clubbing, bilateral BKA's Skin:  No rashes no nodules Neuro:  CN II through XII intact, motor grossly intact  EKG - NSR with LVH  DEVICE  Normal device function.  See PaceArt for details.   Assess/Plan: 1. Chronic systolic heart failure - he is relatively euvolemic. He will continue his current meds. 2. HTN - his blood pressure is elevated. I have asked the patient to go back on amlodipine 10 mg daily which he was not taking. He is encouraged to maintain a low sodium diet. 3. ICD - his [employer] single chamber ICD is working normally. Will recheck in several months. 4. NSVT - he is for the most part asymptomatic. He has not had any sustained VT. He will undergo watchful waiting. Amio would be an option if his symptoms  worsen.  Mikle Bosworth.D.

## 2017-06-23 DIAGNOSIS — D509 Iron deficiency anemia, unspecified: Secondary | ICD-10-CM | POA: Diagnosis not present

## 2017-06-23 DIAGNOSIS — N186 End stage renal disease: Secondary | ICD-10-CM | POA: Diagnosis not present

## 2017-06-23 DIAGNOSIS — N2581 Secondary hyperparathyroidism of renal origin: Secondary | ICD-10-CM | POA: Diagnosis not present

## 2017-06-23 DIAGNOSIS — Z992 Dependence on renal dialysis: Secondary | ICD-10-CM | POA: Diagnosis not present

## 2017-06-23 DIAGNOSIS — D631 Anemia in chronic kidney disease: Secondary | ICD-10-CM | POA: Diagnosis not present

## 2017-06-26 DIAGNOSIS — Z992 Dependence on renal dialysis: Secondary | ICD-10-CM | POA: Diagnosis not present

## 2017-06-26 DIAGNOSIS — N2581 Secondary hyperparathyroidism of renal origin: Secondary | ICD-10-CM | POA: Diagnosis not present

## 2017-06-26 DIAGNOSIS — D509 Iron deficiency anemia, unspecified: Secondary | ICD-10-CM | POA: Diagnosis not present

## 2017-06-26 DIAGNOSIS — D631 Anemia in chronic kidney disease: Secondary | ICD-10-CM | POA: Diagnosis not present

## 2017-06-26 DIAGNOSIS — N186 End stage renal disease: Secondary | ICD-10-CM | POA: Diagnosis not present

## 2017-06-28 DIAGNOSIS — N186 End stage renal disease: Secondary | ICD-10-CM | POA: Diagnosis not present

## 2017-06-28 DIAGNOSIS — N2581 Secondary hyperparathyroidism of renal origin: Secondary | ICD-10-CM | POA: Diagnosis not present

## 2017-06-28 DIAGNOSIS — D509 Iron deficiency anemia, unspecified: Secondary | ICD-10-CM | POA: Diagnosis not present

## 2017-06-28 DIAGNOSIS — D631 Anemia in chronic kidney disease: Secondary | ICD-10-CM | POA: Diagnosis not present

## 2017-06-28 DIAGNOSIS — Z992 Dependence on renal dialysis: Secondary | ICD-10-CM | POA: Diagnosis not present

## 2017-06-30 DIAGNOSIS — Z992 Dependence on renal dialysis: Secondary | ICD-10-CM | POA: Diagnosis not present

## 2017-06-30 DIAGNOSIS — D631 Anemia in chronic kidney disease: Secondary | ICD-10-CM | POA: Diagnosis not present

## 2017-06-30 DIAGNOSIS — N186 End stage renal disease: Secondary | ICD-10-CM | POA: Diagnosis not present

## 2017-06-30 DIAGNOSIS — D509 Iron deficiency anemia, unspecified: Secondary | ICD-10-CM | POA: Diagnosis not present

## 2017-06-30 DIAGNOSIS — N2581 Secondary hyperparathyroidism of renal origin: Secondary | ICD-10-CM | POA: Diagnosis not present

## 2017-07-02 DIAGNOSIS — Z992 Dependence on renal dialysis: Secondary | ICD-10-CM | POA: Diagnosis not present

## 2017-07-02 DIAGNOSIS — N186 End stage renal disease: Secondary | ICD-10-CM | POA: Diagnosis not present

## 2017-07-03 DIAGNOSIS — N186 End stage renal disease: Secondary | ICD-10-CM | POA: Diagnosis not present

## 2017-07-03 DIAGNOSIS — Z992 Dependence on renal dialysis: Secondary | ICD-10-CM | POA: Diagnosis not present

## 2017-07-03 DIAGNOSIS — N2581 Secondary hyperparathyroidism of renal origin: Secondary | ICD-10-CM | POA: Diagnosis not present

## 2017-07-03 DIAGNOSIS — D631 Anemia in chronic kidney disease: Secondary | ICD-10-CM | POA: Diagnosis not present

## 2017-07-03 DIAGNOSIS — D509 Iron deficiency anemia, unspecified: Secondary | ICD-10-CM | POA: Diagnosis not present

## 2017-07-05 DIAGNOSIS — D631 Anemia in chronic kidney disease: Secondary | ICD-10-CM | POA: Diagnosis not present

## 2017-07-05 DIAGNOSIS — Z992 Dependence on renal dialysis: Secondary | ICD-10-CM | POA: Diagnosis not present

## 2017-07-05 DIAGNOSIS — N2581 Secondary hyperparathyroidism of renal origin: Secondary | ICD-10-CM | POA: Diagnosis not present

## 2017-07-05 DIAGNOSIS — D509 Iron deficiency anemia, unspecified: Secondary | ICD-10-CM | POA: Diagnosis not present

## 2017-07-05 DIAGNOSIS — N186 End stage renal disease: Secondary | ICD-10-CM | POA: Diagnosis not present

## 2017-07-07 DIAGNOSIS — Z992 Dependence on renal dialysis: Secondary | ICD-10-CM | POA: Diagnosis not present

## 2017-07-07 DIAGNOSIS — D509 Iron deficiency anemia, unspecified: Secondary | ICD-10-CM | POA: Diagnosis not present

## 2017-07-07 DIAGNOSIS — D631 Anemia in chronic kidney disease: Secondary | ICD-10-CM | POA: Diagnosis not present

## 2017-07-07 DIAGNOSIS — N2581 Secondary hyperparathyroidism of renal origin: Secondary | ICD-10-CM | POA: Diagnosis not present

## 2017-07-07 DIAGNOSIS — N186 End stage renal disease: Secondary | ICD-10-CM | POA: Diagnosis not present

## 2017-07-10 DIAGNOSIS — Z992 Dependence on renal dialysis: Secondary | ICD-10-CM | POA: Diagnosis not present

## 2017-07-10 DIAGNOSIS — D509 Iron deficiency anemia, unspecified: Secondary | ICD-10-CM | POA: Diagnosis not present

## 2017-07-10 DIAGNOSIS — D631 Anemia in chronic kidney disease: Secondary | ICD-10-CM | POA: Diagnosis not present

## 2017-07-10 DIAGNOSIS — N2581 Secondary hyperparathyroidism of renal origin: Secondary | ICD-10-CM | POA: Diagnosis not present

## 2017-07-10 DIAGNOSIS — N186 End stage renal disease: Secondary | ICD-10-CM | POA: Diagnosis not present

## 2017-07-12 DIAGNOSIS — D509 Iron deficiency anemia, unspecified: Secondary | ICD-10-CM | POA: Diagnosis not present

## 2017-07-12 DIAGNOSIS — N2581 Secondary hyperparathyroidism of renal origin: Secondary | ICD-10-CM | POA: Diagnosis not present

## 2017-07-12 DIAGNOSIS — Z992 Dependence on renal dialysis: Secondary | ICD-10-CM | POA: Diagnosis not present

## 2017-07-12 DIAGNOSIS — D631 Anemia in chronic kidney disease: Secondary | ICD-10-CM | POA: Diagnosis not present

## 2017-07-12 DIAGNOSIS — N186 End stage renal disease: Secondary | ICD-10-CM | POA: Diagnosis not present

## 2017-07-14 DIAGNOSIS — N186 End stage renal disease: Secondary | ICD-10-CM | POA: Diagnosis not present

## 2017-07-14 DIAGNOSIS — D631 Anemia in chronic kidney disease: Secondary | ICD-10-CM | POA: Diagnosis not present

## 2017-07-14 DIAGNOSIS — N2581 Secondary hyperparathyroidism of renal origin: Secondary | ICD-10-CM | POA: Diagnosis not present

## 2017-07-14 DIAGNOSIS — Z992 Dependence on renal dialysis: Secondary | ICD-10-CM | POA: Diagnosis not present

## 2017-07-14 DIAGNOSIS — D509 Iron deficiency anemia, unspecified: Secondary | ICD-10-CM | POA: Diagnosis not present

## 2017-07-17 DIAGNOSIS — N186 End stage renal disease: Secondary | ICD-10-CM | POA: Diagnosis not present

## 2017-07-17 DIAGNOSIS — D631 Anemia in chronic kidney disease: Secondary | ICD-10-CM | POA: Diagnosis not present

## 2017-07-17 DIAGNOSIS — D509 Iron deficiency anemia, unspecified: Secondary | ICD-10-CM | POA: Diagnosis not present

## 2017-07-17 DIAGNOSIS — N2581 Secondary hyperparathyroidism of renal origin: Secondary | ICD-10-CM | POA: Diagnosis not present

## 2017-07-17 DIAGNOSIS — Z992 Dependence on renal dialysis: Secondary | ICD-10-CM | POA: Diagnosis not present

## 2017-07-19 DIAGNOSIS — N2581 Secondary hyperparathyroidism of renal origin: Secondary | ICD-10-CM | POA: Diagnosis not present

## 2017-07-19 DIAGNOSIS — D509 Iron deficiency anemia, unspecified: Secondary | ICD-10-CM | POA: Diagnosis not present

## 2017-07-19 DIAGNOSIS — N186 End stage renal disease: Secondary | ICD-10-CM | POA: Diagnosis not present

## 2017-07-19 DIAGNOSIS — D631 Anemia in chronic kidney disease: Secondary | ICD-10-CM | POA: Diagnosis not present

## 2017-07-19 DIAGNOSIS — Z992 Dependence on renal dialysis: Secondary | ICD-10-CM | POA: Diagnosis not present

## 2017-07-21 DIAGNOSIS — D631 Anemia in chronic kidney disease: Secondary | ICD-10-CM | POA: Diagnosis not present

## 2017-07-21 DIAGNOSIS — Z992 Dependence on renal dialysis: Secondary | ICD-10-CM | POA: Diagnosis not present

## 2017-07-21 DIAGNOSIS — N186 End stage renal disease: Secondary | ICD-10-CM | POA: Diagnosis not present

## 2017-07-21 DIAGNOSIS — N2581 Secondary hyperparathyroidism of renal origin: Secondary | ICD-10-CM | POA: Diagnosis not present

## 2017-07-21 DIAGNOSIS — D509 Iron deficiency anemia, unspecified: Secondary | ICD-10-CM | POA: Diagnosis not present

## 2017-07-24 DIAGNOSIS — N2581 Secondary hyperparathyroidism of renal origin: Secondary | ICD-10-CM | POA: Diagnosis not present

## 2017-07-24 DIAGNOSIS — N186 End stage renal disease: Secondary | ICD-10-CM | POA: Diagnosis not present

## 2017-07-24 DIAGNOSIS — D509 Iron deficiency anemia, unspecified: Secondary | ICD-10-CM | POA: Diagnosis not present

## 2017-07-24 DIAGNOSIS — D631 Anemia in chronic kidney disease: Secondary | ICD-10-CM | POA: Diagnosis not present

## 2017-07-24 DIAGNOSIS — Z992 Dependence on renal dialysis: Secondary | ICD-10-CM | POA: Diagnosis not present

## 2017-07-25 ENCOUNTER — Other Ambulatory Visit: Payer: Self-pay | Admitting: Internal Medicine

## 2017-07-26 DIAGNOSIS — D509 Iron deficiency anemia, unspecified: Secondary | ICD-10-CM | POA: Diagnosis not present

## 2017-07-26 DIAGNOSIS — N2581 Secondary hyperparathyroidism of renal origin: Secondary | ICD-10-CM | POA: Diagnosis not present

## 2017-07-26 DIAGNOSIS — D631 Anemia in chronic kidney disease: Secondary | ICD-10-CM | POA: Diagnosis not present

## 2017-07-26 DIAGNOSIS — Z992 Dependence on renal dialysis: Secondary | ICD-10-CM | POA: Diagnosis not present

## 2017-07-26 DIAGNOSIS — N186 End stage renal disease: Secondary | ICD-10-CM | POA: Diagnosis not present

## 2017-07-28 DIAGNOSIS — N186 End stage renal disease: Secondary | ICD-10-CM | POA: Diagnosis not present

## 2017-07-28 DIAGNOSIS — D631 Anemia in chronic kidney disease: Secondary | ICD-10-CM | POA: Diagnosis not present

## 2017-07-28 DIAGNOSIS — N2581 Secondary hyperparathyroidism of renal origin: Secondary | ICD-10-CM | POA: Diagnosis not present

## 2017-07-28 DIAGNOSIS — Z992 Dependence on renal dialysis: Secondary | ICD-10-CM | POA: Diagnosis not present

## 2017-07-28 DIAGNOSIS — D509 Iron deficiency anemia, unspecified: Secondary | ICD-10-CM | POA: Diagnosis not present

## 2017-07-31 DIAGNOSIS — D631 Anemia in chronic kidney disease: Secondary | ICD-10-CM | POA: Diagnosis not present

## 2017-07-31 DIAGNOSIS — N2581 Secondary hyperparathyroidism of renal origin: Secondary | ICD-10-CM | POA: Diagnosis not present

## 2017-07-31 DIAGNOSIS — D509 Iron deficiency anemia, unspecified: Secondary | ICD-10-CM | POA: Diagnosis not present

## 2017-07-31 DIAGNOSIS — N186 End stage renal disease: Secondary | ICD-10-CM | POA: Diagnosis not present

## 2017-07-31 DIAGNOSIS — Z992 Dependence on renal dialysis: Secondary | ICD-10-CM | POA: Diagnosis not present

## 2017-08-01 DIAGNOSIS — Z992 Dependence on renal dialysis: Secondary | ICD-10-CM | POA: Diagnosis not present

## 2017-08-01 DIAGNOSIS — N186 End stage renal disease: Secondary | ICD-10-CM | POA: Diagnosis not present

## 2017-08-02 DIAGNOSIS — D509 Iron deficiency anemia, unspecified: Secondary | ICD-10-CM | POA: Diagnosis not present

## 2017-08-02 DIAGNOSIS — N186 End stage renal disease: Secondary | ICD-10-CM | POA: Diagnosis not present

## 2017-08-02 DIAGNOSIS — N2581 Secondary hyperparathyroidism of renal origin: Secondary | ICD-10-CM | POA: Diagnosis not present

## 2017-08-02 DIAGNOSIS — Z992 Dependence on renal dialysis: Secondary | ICD-10-CM | POA: Diagnosis not present

## 2017-08-02 DIAGNOSIS — D631 Anemia in chronic kidney disease: Secondary | ICD-10-CM | POA: Diagnosis not present

## 2017-08-04 DIAGNOSIS — D509 Iron deficiency anemia, unspecified: Secondary | ICD-10-CM | POA: Diagnosis not present

## 2017-08-04 DIAGNOSIS — D631 Anemia in chronic kidney disease: Secondary | ICD-10-CM | POA: Diagnosis not present

## 2017-08-04 DIAGNOSIS — Z992 Dependence on renal dialysis: Secondary | ICD-10-CM | POA: Diagnosis not present

## 2017-08-04 DIAGNOSIS — N2581 Secondary hyperparathyroidism of renal origin: Secondary | ICD-10-CM | POA: Diagnosis not present

## 2017-08-04 DIAGNOSIS — N186 End stage renal disease: Secondary | ICD-10-CM | POA: Diagnosis not present

## 2017-08-07 DIAGNOSIS — N186 End stage renal disease: Secondary | ICD-10-CM | POA: Diagnosis not present

## 2017-08-07 DIAGNOSIS — Z992 Dependence on renal dialysis: Secondary | ICD-10-CM | POA: Diagnosis not present

## 2017-08-07 DIAGNOSIS — D509 Iron deficiency anemia, unspecified: Secondary | ICD-10-CM | POA: Diagnosis not present

## 2017-08-07 DIAGNOSIS — N2581 Secondary hyperparathyroidism of renal origin: Secondary | ICD-10-CM | POA: Diagnosis not present

## 2017-08-07 DIAGNOSIS — D631 Anemia in chronic kidney disease: Secondary | ICD-10-CM | POA: Diagnosis not present

## 2017-08-09 DIAGNOSIS — D509 Iron deficiency anemia, unspecified: Secondary | ICD-10-CM | POA: Diagnosis not present

## 2017-08-09 DIAGNOSIS — Z992 Dependence on renal dialysis: Secondary | ICD-10-CM | POA: Diagnosis not present

## 2017-08-09 DIAGNOSIS — N186 End stage renal disease: Secondary | ICD-10-CM | POA: Diagnosis not present

## 2017-08-09 DIAGNOSIS — D631 Anemia in chronic kidney disease: Secondary | ICD-10-CM | POA: Diagnosis not present

## 2017-08-09 DIAGNOSIS — N2581 Secondary hyperparathyroidism of renal origin: Secondary | ICD-10-CM | POA: Diagnosis not present

## 2017-08-11 DIAGNOSIS — Z992 Dependence on renal dialysis: Secondary | ICD-10-CM | POA: Diagnosis not present

## 2017-08-11 DIAGNOSIS — D509 Iron deficiency anemia, unspecified: Secondary | ICD-10-CM | POA: Diagnosis not present

## 2017-08-11 DIAGNOSIS — N186 End stage renal disease: Secondary | ICD-10-CM | POA: Diagnosis not present

## 2017-08-11 DIAGNOSIS — N2581 Secondary hyperparathyroidism of renal origin: Secondary | ICD-10-CM | POA: Diagnosis not present

## 2017-08-11 DIAGNOSIS — D631 Anemia in chronic kidney disease: Secondary | ICD-10-CM | POA: Diagnosis not present

## 2017-08-14 DIAGNOSIS — N2581 Secondary hyperparathyroidism of renal origin: Secondary | ICD-10-CM | POA: Diagnosis not present

## 2017-08-14 DIAGNOSIS — N186 End stage renal disease: Secondary | ICD-10-CM | POA: Diagnosis not present

## 2017-08-14 DIAGNOSIS — D509 Iron deficiency anemia, unspecified: Secondary | ICD-10-CM | POA: Diagnosis not present

## 2017-08-14 DIAGNOSIS — Z992 Dependence on renal dialysis: Secondary | ICD-10-CM | POA: Diagnosis not present

## 2017-08-14 DIAGNOSIS — D631 Anemia in chronic kidney disease: Secondary | ICD-10-CM | POA: Diagnosis not present

## 2017-08-16 DIAGNOSIS — D509 Iron deficiency anemia, unspecified: Secondary | ICD-10-CM | POA: Diagnosis not present

## 2017-08-16 DIAGNOSIS — Z992 Dependence on renal dialysis: Secondary | ICD-10-CM | POA: Diagnosis not present

## 2017-08-16 DIAGNOSIS — N2581 Secondary hyperparathyroidism of renal origin: Secondary | ICD-10-CM | POA: Diagnosis not present

## 2017-08-16 DIAGNOSIS — N186 End stage renal disease: Secondary | ICD-10-CM | POA: Diagnosis not present

## 2017-08-16 DIAGNOSIS — D631 Anemia in chronic kidney disease: Secondary | ICD-10-CM | POA: Diagnosis not present

## 2017-08-18 DIAGNOSIS — D631 Anemia in chronic kidney disease: Secondary | ICD-10-CM | POA: Diagnosis not present

## 2017-08-18 DIAGNOSIS — Z992 Dependence on renal dialysis: Secondary | ICD-10-CM | POA: Diagnosis not present

## 2017-08-18 DIAGNOSIS — N2581 Secondary hyperparathyroidism of renal origin: Secondary | ICD-10-CM | POA: Diagnosis not present

## 2017-08-18 DIAGNOSIS — D509 Iron deficiency anemia, unspecified: Secondary | ICD-10-CM | POA: Diagnosis not present

## 2017-08-18 DIAGNOSIS — N186 End stage renal disease: Secondary | ICD-10-CM | POA: Diagnosis not present

## 2017-08-21 DIAGNOSIS — Z992 Dependence on renal dialysis: Secondary | ICD-10-CM | POA: Diagnosis not present

## 2017-08-21 DIAGNOSIS — N2581 Secondary hyperparathyroidism of renal origin: Secondary | ICD-10-CM | POA: Diagnosis not present

## 2017-08-21 DIAGNOSIS — D631 Anemia in chronic kidney disease: Secondary | ICD-10-CM | POA: Diagnosis not present

## 2017-08-21 DIAGNOSIS — N186 End stage renal disease: Secondary | ICD-10-CM | POA: Diagnosis not present

## 2017-08-21 DIAGNOSIS — D509 Iron deficiency anemia, unspecified: Secondary | ICD-10-CM | POA: Diagnosis not present

## 2017-08-23 DIAGNOSIS — N2581 Secondary hyperparathyroidism of renal origin: Secondary | ICD-10-CM | POA: Diagnosis not present

## 2017-08-23 DIAGNOSIS — N186 End stage renal disease: Secondary | ICD-10-CM | POA: Diagnosis not present

## 2017-08-23 DIAGNOSIS — Z992 Dependence on renal dialysis: Secondary | ICD-10-CM | POA: Diagnosis not present

## 2017-08-23 DIAGNOSIS — D509 Iron deficiency anemia, unspecified: Secondary | ICD-10-CM | POA: Diagnosis not present

## 2017-08-23 DIAGNOSIS — D631 Anemia in chronic kidney disease: Secondary | ICD-10-CM | POA: Diagnosis not present

## 2017-08-25 DIAGNOSIS — D631 Anemia in chronic kidney disease: Secondary | ICD-10-CM | POA: Diagnosis not present

## 2017-08-25 DIAGNOSIS — N186 End stage renal disease: Secondary | ICD-10-CM | POA: Diagnosis not present

## 2017-08-25 DIAGNOSIS — Z992 Dependence on renal dialysis: Secondary | ICD-10-CM | POA: Diagnosis not present

## 2017-08-25 DIAGNOSIS — D509 Iron deficiency anemia, unspecified: Secondary | ICD-10-CM | POA: Diagnosis not present

## 2017-08-25 DIAGNOSIS — N2581 Secondary hyperparathyroidism of renal origin: Secondary | ICD-10-CM | POA: Diagnosis not present

## 2017-08-28 DIAGNOSIS — N2581 Secondary hyperparathyroidism of renal origin: Secondary | ICD-10-CM | POA: Diagnosis not present

## 2017-08-28 DIAGNOSIS — D631 Anemia in chronic kidney disease: Secondary | ICD-10-CM | POA: Diagnosis not present

## 2017-08-28 DIAGNOSIS — N186 End stage renal disease: Secondary | ICD-10-CM | POA: Diagnosis not present

## 2017-08-28 DIAGNOSIS — D509 Iron deficiency anemia, unspecified: Secondary | ICD-10-CM | POA: Diagnosis not present

## 2017-08-28 DIAGNOSIS — Z992 Dependence on renal dialysis: Secondary | ICD-10-CM | POA: Diagnosis not present

## 2017-08-29 DIAGNOSIS — E1165 Type 2 diabetes mellitus with hyperglycemia: Secondary | ICD-10-CM | POA: Diagnosis not present

## 2017-08-29 DIAGNOSIS — Z79899 Other long term (current) drug therapy: Secondary | ICD-10-CM | POA: Diagnosis not present

## 2017-08-29 DIAGNOSIS — I10 Essential (primary) hypertension: Secondary | ICD-10-CM | POA: Diagnosis not present

## 2017-08-29 DIAGNOSIS — E782 Mixed hyperlipidemia: Secondary | ICD-10-CM | POA: Diagnosis not present

## 2017-08-30 DIAGNOSIS — N186 End stage renal disease: Secondary | ICD-10-CM | POA: Diagnosis not present

## 2017-08-30 DIAGNOSIS — E119 Type 2 diabetes mellitus without complications: Secondary | ICD-10-CM | POA: Diagnosis not present

## 2017-08-30 DIAGNOSIS — N2581 Secondary hyperparathyroidism of renal origin: Secondary | ICD-10-CM | POA: Diagnosis not present

## 2017-08-30 DIAGNOSIS — D509 Iron deficiency anemia, unspecified: Secondary | ICD-10-CM | POA: Diagnosis not present

## 2017-08-30 DIAGNOSIS — Z794 Long term (current) use of insulin: Secondary | ICD-10-CM | POA: Diagnosis not present

## 2017-08-30 DIAGNOSIS — D631 Anemia in chronic kidney disease: Secondary | ICD-10-CM | POA: Diagnosis not present

## 2017-08-30 DIAGNOSIS — Z992 Dependence on renal dialysis: Secondary | ICD-10-CM | POA: Diagnosis not present

## 2017-09-01 DIAGNOSIS — D509 Iron deficiency anemia, unspecified: Secondary | ICD-10-CM | POA: Diagnosis not present

## 2017-09-01 DIAGNOSIS — N186 End stage renal disease: Secondary | ICD-10-CM | POA: Diagnosis not present

## 2017-09-01 DIAGNOSIS — Z992 Dependence on renal dialysis: Secondary | ICD-10-CM | POA: Diagnosis not present

## 2017-09-01 DIAGNOSIS — N2581 Secondary hyperparathyroidism of renal origin: Secondary | ICD-10-CM | POA: Diagnosis not present

## 2017-09-01 DIAGNOSIS — D631 Anemia in chronic kidney disease: Secondary | ICD-10-CM | POA: Diagnosis not present

## 2017-09-04 DIAGNOSIS — N2581 Secondary hyperparathyroidism of renal origin: Secondary | ICD-10-CM | POA: Diagnosis not present

## 2017-09-04 DIAGNOSIS — D509 Iron deficiency anemia, unspecified: Secondary | ICD-10-CM | POA: Diagnosis not present

## 2017-09-04 DIAGNOSIS — D631 Anemia in chronic kidney disease: Secondary | ICD-10-CM | POA: Diagnosis not present

## 2017-09-04 DIAGNOSIS — N186 End stage renal disease: Secondary | ICD-10-CM | POA: Diagnosis not present

## 2017-09-04 DIAGNOSIS — Z992 Dependence on renal dialysis: Secondary | ICD-10-CM | POA: Diagnosis not present

## 2017-09-05 DIAGNOSIS — E1165 Type 2 diabetes mellitus with hyperglycemia: Secondary | ICD-10-CM | POA: Diagnosis not present

## 2017-09-05 DIAGNOSIS — E782 Mixed hyperlipidemia: Secondary | ICD-10-CM | POA: Diagnosis not present

## 2017-09-05 DIAGNOSIS — N186 End stage renal disease: Secondary | ICD-10-CM | POA: Diagnosis not present

## 2017-09-05 DIAGNOSIS — I10 Essential (primary) hypertension: Secondary | ICD-10-CM | POA: Diagnosis not present

## 2017-09-06 DIAGNOSIS — Z992 Dependence on renal dialysis: Secondary | ICD-10-CM | POA: Diagnosis not present

## 2017-09-06 DIAGNOSIS — N186 End stage renal disease: Secondary | ICD-10-CM | POA: Diagnosis not present

## 2017-09-06 DIAGNOSIS — D509 Iron deficiency anemia, unspecified: Secondary | ICD-10-CM | POA: Diagnosis not present

## 2017-09-06 DIAGNOSIS — N2581 Secondary hyperparathyroidism of renal origin: Secondary | ICD-10-CM | POA: Diagnosis not present

## 2017-09-06 DIAGNOSIS — D631 Anemia in chronic kidney disease: Secondary | ICD-10-CM | POA: Diagnosis not present

## 2017-09-08 DIAGNOSIS — N186 End stage renal disease: Secondary | ICD-10-CM | POA: Diagnosis not present

## 2017-09-08 DIAGNOSIS — Z992 Dependence on renal dialysis: Secondary | ICD-10-CM | POA: Diagnosis not present

## 2017-09-08 DIAGNOSIS — D509 Iron deficiency anemia, unspecified: Secondary | ICD-10-CM | POA: Diagnosis not present

## 2017-09-08 DIAGNOSIS — N2581 Secondary hyperparathyroidism of renal origin: Secondary | ICD-10-CM | POA: Diagnosis not present

## 2017-09-08 DIAGNOSIS — D631 Anemia in chronic kidney disease: Secondary | ICD-10-CM | POA: Diagnosis not present

## 2017-09-11 DIAGNOSIS — Z992 Dependence on renal dialysis: Secondary | ICD-10-CM | POA: Diagnosis not present

## 2017-09-11 DIAGNOSIS — D631 Anemia in chronic kidney disease: Secondary | ICD-10-CM | POA: Diagnosis not present

## 2017-09-11 DIAGNOSIS — N2581 Secondary hyperparathyroidism of renal origin: Secondary | ICD-10-CM | POA: Diagnosis not present

## 2017-09-11 DIAGNOSIS — D509 Iron deficiency anemia, unspecified: Secondary | ICD-10-CM | POA: Diagnosis not present

## 2017-09-11 DIAGNOSIS — N186 End stage renal disease: Secondary | ICD-10-CM | POA: Diagnosis not present

## 2017-09-13 DIAGNOSIS — N2581 Secondary hyperparathyroidism of renal origin: Secondary | ICD-10-CM | POA: Diagnosis not present

## 2017-09-13 DIAGNOSIS — D509 Iron deficiency anemia, unspecified: Secondary | ICD-10-CM | POA: Diagnosis not present

## 2017-09-13 DIAGNOSIS — Z992 Dependence on renal dialysis: Secondary | ICD-10-CM | POA: Diagnosis not present

## 2017-09-13 DIAGNOSIS — D631 Anemia in chronic kidney disease: Secondary | ICD-10-CM | POA: Diagnosis not present

## 2017-09-13 DIAGNOSIS — N186 End stage renal disease: Secondary | ICD-10-CM | POA: Diagnosis not present

## 2017-09-15 DIAGNOSIS — N2581 Secondary hyperparathyroidism of renal origin: Secondary | ICD-10-CM | POA: Diagnosis not present

## 2017-09-15 DIAGNOSIS — D631 Anemia in chronic kidney disease: Secondary | ICD-10-CM | POA: Diagnosis not present

## 2017-09-15 DIAGNOSIS — N186 End stage renal disease: Secondary | ICD-10-CM | POA: Diagnosis not present

## 2017-09-15 DIAGNOSIS — Z992 Dependence on renal dialysis: Secondary | ICD-10-CM | POA: Diagnosis not present

## 2017-09-15 DIAGNOSIS — D509 Iron deficiency anemia, unspecified: Secondary | ICD-10-CM | POA: Diagnosis not present

## 2017-09-18 DIAGNOSIS — D509 Iron deficiency anemia, unspecified: Secondary | ICD-10-CM | POA: Diagnosis not present

## 2017-09-18 DIAGNOSIS — Z992 Dependence on renal dialysis: Secondary | ICD-10-CM | POA: Diagnosis not present

## 2017-09-18 DIAGNOSIS — D631 Anemia in chronic kidney disease: Secondary | ICD-10-CM | POA: Diagnosis not present

## 2017-09-18 DIAGNOSIS — N2581 Secondary hyperparathyroidism of renal origin: Secondary | ICD-10-CM | POA: Diagnosis not present

## 2017-09-18 DIAGNOSIS — N186 End stage renal disease: Secondary | ICD-10-CM | POA: Diagnosis not present

## 2017-09-20 DIAGNOSIS — N186 End stage renal disease: Secondary | ICD-10-CM | POA: Diagnosis not present

## 2017-09-20 DIAGNOSIS — D509 Iron deficiency anemia, unspecified: Secondary | ICD-10-CM | POA: Diagnosis not present

## 2017-09-20 DIAGNOSIS — Z992 Dependence on renal dialysis: Secondary | ICD-10-CM | POA: Diagnosis not present

## 2017-09-20 DIAGNOSIS — N2581 Secondary hyperparathyroidism of renal origin: Secondary | ICD-10-CM | POA: Diagnosis not present

## 2017-09-20 DIAGNOSIS — D631 Anemia in chronic kidney disease: Secondary | ICD-10-CM | POA: Diagnosis not present

## 2017-09-22 DIAGNOSIS — D509 Iron deficiency anemia, unspecified: Secondary | ICD-10-CM | POA: Diagnosis not present

## 2017-09-22 DIAGNOSIS — N186 End stage renal disease: Secondary | ICD-10-CM | POA: Diagnosis not present

## 2017-09-22 DIAGNOSIS — D631 Anemia in chronic kidney disease: Secondary | ICD-10-CM | POA: Diagnosis not present

## 2017-09-22 DIAGNOSIS — N2581 Secondary hyperparathyroidism of renal origin: Secondary | ICD-10-CM | POA: Diagnosis not present

## 2017-09-22 DIAGNOSIS — Z992 Dependence on renal dialysis: Secondary | ICD-10-CM | POA: Diagnosis not present

## 2017-09-25 DIAGNOSIS — D631 Anemia in chronic kidney disease: Secondary | ICD-10-CM | POA: Diagnosis not present

## 2017-09-25 DIAGNOSIS — Z992 Dependence on renal dialysis: Secondary | ICD-10-CM | POA: Diagnosis not present

## 2017-09-25 DIAGNOSIS — N2581 Secondary hyperparathyroidism of renal origin: Secondary | ICD-10-CM | POA: Diagnosis not present

## 2017-09-25 DIAGNOSIS — D509 Iron deficiency anemia, unspecified: Secondary | ICD-10-CM | POA: Diagnosis not present

## 2017-09-25 DIAGNOSIS — N186 End stage renal disease: Secondary | ICD-10-CM | POA: Diagnosis not present

## 2017-09-26 ENCOUNTER — Encounter: Payer: Self-pay | Admitting: Cardiology

## 2017-09-26 NOTE — Progress Notes (Signed)
Letter  

## 2017-09-27 DIAGNOSIS — D631 Anemia in chronic kidney disease: Secondary | ICD-10-CM | POA: Diagnosis not present

## 2017-09-27 DIAGNOSIS — N186 End stage renal disease: Secondary | ICD-10-CM | POA: Diagnosis not present

## 2017-09-27 DIAGNOSIS — Z992 Dependence on renal dialysis: Secondary | ICD-10-CM | POA: Diagnosis not present

## 2017-09-27 DIAGNOSIS — D509 Iron deficiency anemia, unspecified: Secondary | ICD-10-CM | POA: Diagnosis not present

## 2017-09-27 DIAGNOSIS — N2581 Secondary hyperparathyroidism of renal origin: Secondary | ICD-10-CM | POA: Diagnosis not present

## 2017-09-29 DIAGNOSIS — Z992 Dependence on renal dialysis: Secondary | ICD-10-CM | POA: Diagnosis not present

## 2017-09-29 DIAGNOSIS — N186 End stage renal disease: Secondary | ICD-10-CM | POA: Diagnosis not present

## 2017-09-29 DIAGNOSIS — D509 Iron deficiency anemia, unspecified: Secondary | ICD-10-CM | POA: Diagnosis not present

## 2017-09-29 DIAGNOSIS — D631 Anemia in chronic kidney disease: Secondary | ICD-10-CM | POA: Diagnosis not present

## 2017-09-29 DIAGNOSIS — N2581 Secondary hyperparathyroidism of renal origin: Secondary | ICD-10-CM | POA: Diagnosis not present

## 2017-10-01 DIAGNOSIS — Z992 Dependence on renal dialysis: Secondary | ICD-10-CM | POA: Diagnosis not present

## 2017-10-01 DIAGNOSIS — N186 End stage renal disease: Secondary | ICD-10-CM | POA: Diagnosis not present

## 2017-10-02 DIAGNOSIS — N2581 Secondary hyperparathyroidism of renal origin: Secondary | ICD-10-CM | POA: Diagnosis not present

## 2017-10-02 DIAGNOSIS — D509 Iron deficiency anemia, unspecified: Secondary | ICD-10-CM | POA: Diagnosis not present

## 2017-10-02 DIAGNOSIS — Z992 Dependence on renal dialysis: Secondary | ICD-10-CM | POA: Diagnosis not present

## 2017-10-02 DIAGNOSIS — N186 End stage renal disease: Secondary | ICD-10-CM | POA: Diagnosis not present

## 2017-10-02 DIAGNOSIS — D631 Anemia in chronic kidney disease: Secondary | ICD-10-CM | POA: Diagnosis not present

## 2017-10-04 DIAGNOSIS — Z992 Dependence on renal dialysis: Secondary | ICD-10-CM | POA: Diagnosis not present

## 2017-10-04 DIAGNOSIS — N186 End stage renal disease: Secondary | ICD-10-CM | POA: Diagnosis not present

## 2017-10-04 DIAGNOSIS — D509 Iron deficiency anemia, unspecified: Secondary | ICD-10-CM | POA: Diagnosis not present

## 2017-10-04 DIAGNOSIS — N2581 Secondary hyperparathyroidism of renal origin: Secondary | ICD-10-CM | POA: Diagnosis not present

## 2017-10-04 DIAGNOSIS — D631 Anemia in chronic kidney disease: Secondary | ICD-10-CM | POA: Diagnosis not present

## 2017-10-06 DIAGNOSIS — Z992 Dependence on renal dialysis: Secondary | ICD-10-CM | POA: Diagnosis not present

## 2017-10-06 DIAGNOSIS — D509 Iron deficiency anemia, unspecified: Secondary | ICD-10-CM | POA: Diagnosis not present

## 2017-10-06 DIAGNOSIS — N2581 Secondary hyperparathyroidism of renal origin: Secondary | ICD-10-CM | POA: Diagnosis not present

## 2017-10-06 DIAGNOSIS — N186 End stage renal disease: Secondary | ICD-10-CM | POA: Diagnosis not present

## 2017-10-06 DIAGNOSIS — D631 Anemia in chronic kidney disease: Secondary | ICD-10-CM | POA: Diagnosis not present

## 2017-10-09 DIAGNOSIS — D631 Anemia in chronic kidney disease: Secondary | ICD-10-CM | POA: Diagnosis not present

## 2017-10-09 DIAGNOSIS — D509 Iron deficiency anemia, unspecified: Secondary | ICD-10-CM | POA: Diagnosis not present

## 2017-10-09 DIAGNOSIS — N186 End stage renal disease: Secondary | ICD-10-CM | POA: Diagnosis not present

## 2017-10-09 DIAGNOSIS — Z992 Dependence on renal dialysis: Secondary | ICD-10-CM | POA: Diagnosis not present

## 2017-10-09 DIAGNOSIS — N2581 Secondary hyperparathyroidism of renal origin: Secondary | ICD-10-CM | POA: Diagnosis not present

## 2017-10-11 DIAGNOSIS — D509 Iron deficiency anemia, unspecified: Secondary | ICD-10-CM | POA: Diagnosis not present

## 2017-10-11 DIAGNOSIS — D631 Anemia in chronic kidney disease: Secondary | ICD-10-CM | POA: Diagnosis not present

## 2017-10-11 DIAGNOSIS — N2581 Secondary hyperparathyroidism of renal origin: Secondary | ICD-10-CM | POA: Diagnosis not present

## 2017-10-11 DIAGNOSIS — N186 End stage renal disease: Secondary | ICD-10-CM | POA: Diagnosis not present

## 2017-10-11 DIAGNOSIS — Z992 Dependence on renal dialysis: Secondary | ICD-10-CM | POA: Diagnosis not present

## 2017-10-13 DIAGNOSIS — N186 End stage renal disease: Secondary | ICD-10-CM | POA: Diagnosis not present

## 2017-10-13 DIAGNOSIS — Z992 Dependence on renal dialysis: Secondary | ICD-10-CM | POA: Diagnosis not present

## 2017-10-13 DIAGNOSIS — D509 Iron deficiency anemia, unspecified: Secondary | ICD-10-CM | POA: Diagnosis not present

## 2017-10-13 DIAGNOSIS — D631 Anemia in chronic kidney disease: Secondary | ICD-10-CM | POA: Diagnosis not present

## 2017-10-13 DIAGNOSIS — N2581 Secondary hyperparathyroidism of renal origin: Secondary | ICD-10-CM | POA: Diagnosis not present

## 2017-10-16 DIAGNOSIS — N186 End stage renal disease: Secondary | ICD-10-CM | POA: Diagnosis not present

## 2017-10-16 DIAGNOSIS — Z992 Dependence on renal dialysis: Secondary | ICD-10-CM | POA: Diagnosis not present

## 2017-10-16 DIAGNOSIS — D509 Iron deficiency anemia, unspecified: Secondary | ICD-10-CM | POA: Diagnosis not present

## 2017-10-16 DIAGNOSIS — N2581 Secondary hyperparathyroidism of renal origin: Secondary | ICD-10-CM | POA: Diagnosis not present

## 2017-10-16 DIAGNOSIS — D631 Anemia in chronic kidney disease: Secondary | ICD-10-CM | POA: Diagnosis not present

## 2017-10-18 DIAGNOSIS — D631 Anemia in chronic kidney disease: Secondary | ICD-10-CM | POA: Diagnosis not present

## 2017-10-18 DIAGNOSIS — Z992 Dependence on renal dialysis: Secondary | ICD-10-CM | POA: Diagnosis not present

## 2017-10-18 DIAGNOSIS — N2581 Secondary hyperparathyroidism of renal origin: Secondary | ICD-10-CM | POA: Diagnosis not present

## 2017-10-18 DIAGNOSIS — D509 Iron deficiency anemia, unspecified: Secondary | ICD-10-CM | POA: Diagnosis not present

## 2017-10-18 DIAGNOSIS — N186 End stage renal disease: Secondary | ICD-10-CM | POA: Diagnosis not present

## 2017-10-20 ENCOUNTER — Ambulatory Visit (INDEPENDENT_AMBULATORY_CARE_PROVIDER_SITE_OTHER): Payer: Medicare Other | Admitting: *Deleted

## 2017-10-20 DIAGNOSIS — Z992 Dependence on renal dialysis: Secondary | ICD-10-CM | POA: Diagnosis not present

## 2017-10-20 DIAGNOSIS — N186 End stage renal disease: Secondary | ICD-10-CM | POA: Diagnosis not present

## 2017-10-20 DIAGNOSIS — D631 Anemia in chronic kidney disease: Secondary | ICD-10-CM | POA: Diagnosis not present

## 2017-10-20 DIAGNOSIS — I5042 Chronic combined systolic (congestive) and diastolic (congestive) heart failure: Secondary | ICD-10-CM

## 2017-10-20 DIAGNOSIS — N2581 Secondary hyperparathyroidism of renal origin: Secondary | ICD-10-CM | POA: Diagnosis not present

## 2017-10-20 DIAGNOSIS — D509 Iron deficiency anemia, unspecified: Secondary | ICD-10-CM | POA: Diagnosis not present

## 2017-10-20 LAB — CUP PACEART INCLINIC DEVICE CHECK
Date Time Interrogation Session: 20190719040000
HIGH POWER IMPEDANCE MEASURED VALUE: 49 Ohm
HighPow Impedance: 34 Ohm
Implantable Lead Location: 753860
Implantable Lead Model: 158
Implantable Lead Serial Number: 153491
Implantable Pulse Generator Implant Date: 20110722
Lead Channel Impedance Value: 497 Ohm
Lead Channel Pacing Threshold Amplitude: 0.8 V
Lead Channel Pacing Threshold Pulse Width: 0.4 ms
Lead Channel Setting Sensing Sensitivity: 0.4 mV
MDC IDC LEAD IMPLANT DT: 20050810
MDC IDC MSMT LEADCHNL RV SENSING INTR AMPL: 25 mV — AB
MDC IDC SET LEADCHNL RV PACING AMPLITUDE: 2.5 V
MDC IDC SET LEADCHNL RV PACING PULSEWIDTH: 0.4 ms
Pulse Gen Serial Number: 269222

## 2017-10-20 NOTE — Progress Notes (Signed)
ICD check in clinic. Normal device function. Threshold and sensing consistent with previous device measurements. Impedance trends stable over time. No evidence of any ventricular arrhythmias. Histogram distribution appropriate for patient and level of activity. No changes made this session. Device programmed at appropriate safety margins. Device programmed to optimize intrinsic conduction. Estimated longevity 5.5 years. Pt will follow up via [facility] on 10/21, GT in 06-2018. Patient education completed including shock plan.

## 2017-10-23 DIAGNOSIS — D631 Anemia in chronic kidney disease: Secondary | ICD-10-CM | POA: Diagnosis not present

## 2017-10-23 DIAGNOSIS — Z992 Dependence on renal dialysis: Secondary | ICD-10-CM | POA: Diagnosis not present

## 2017-10-23 DIAGNOSIS — N2581 Secondary hyperparathyroidism of renal origin: Secondary | ICD-10-CM | POA: Diagnosis not present

## 2017-10-23 DIAGNOSIS — D509 Iron deficiency anemia, unspecified: Secondary | ICD-10-CM | POA: Diagnosis not present

## 2017-10-23 DIAGNOSIS — N186 End stage renal disease: Secondary | ICD-10-CM | POA: Diagnosis not present

## 2017-10-25 DIAGNOSIS — N186 End stage renal disease: Secondary | ICD-10-CM | POA: Diagnosis not present

## 2017-10-25 DIAGNOSIS — D631 Anemia in chronic kidney disease: Secondary | ICD-10-CM | POA: Diagnosis not present

## 2017-10-25 DIAGNOSIS — Z992 Dependence on renal dialysis: Secondary | ICD-10-CM | POA: Diagnosis not present

## 2017-10-25 DIAGNOSIS — N2581 Secondary hyperparathyroidism of renal origin: Secondary | ICD-10-CM | POA: Diagnosis not present

## 2017-10-25 DIAGNOSIS — D509 Iron deficiency anemia, unspecified: Secondary | ICD-10-CM | POA: Diagnosis not present

## 2017-10-27 DIAGNOSIS — Z992 Dependence on renal dialysis: Secondary | ICD-10-CM | POA: Diagnosis not present

## 2017-10-27 DIAGNOSIS — N186 End stage renal disease: Secondary | ICD-10-CM | POA: Diagnosis not present

## 2017-10-27 DIAGNOSIS — D631 Anemia in chronic kidney disease: Secondary | ICD-10-CM | POA: Diagnosis not present

## 2017-10-27 DIAGNOSIS — D509 Iron deficiency anemia, unspecified: Secondary | ICD-10-CM | POA: Diagnosis not present

## 2017-10-27 DIAGNOSIS — N2581 Secondary hyperparathyroidism of renal origin: Secondary | ICD-10-CM | POA: Diagnosis not present

## 2017-10-30 DIAGNOSIS — N186 End stage renal disease: Secondary | ICD-10-CM | POA: Diagnosis not present

## 2017-10-30 DIAGNOSIS — Z992 Dependence on renal dialysis: Secondary | ICD-10-CM | POA: Diagnosis not present

## 2017-10-30 DIAGNOSIS — N2581 Secondary hyperparathyroidism of renal origin: Secondary | ICD-10-CM | POA: Diagnosis not present

## 2017-10-30 DIAGNOSIS — D631 Anemia in chronic kidney disease: Secondary | ICD-10-CM | POA: Diagnosis not present

## 2017-10-30 DIAGNOSIS — D509 Iron deficiency anemia, unspecified: Secondary | ICD-10-CM | POA: Diagnosis not present

## 2017-11-01 DIAGNOSIS — D509 Iron deficiency anemia, unspecified: Secondary | ICD-10-CM | POA: Diagnosis not present

## 2017-11-01 DIAGNOSIS — N186 End stage renal disease: Secondary | ICD-10-CM | POA: Diagnosis not present

## 2017-11-01 DIAGNOSIS — N2581 Secondary hyperparathyroidism of renal origin: Secondary | ICD-10-CM | POA: Diagnosis not present

## 2017-11-01 DIAGNOSIS — Z992 Dependence on renal dialysis: Secondary | ICD-10-CM | POA: Diagnosis not present

## 2017-11-01 DIAGNOSIS — D631 Anemia in chronic kidney disease: Secondary | ICD-10-CM | POA: Diagnosis not present

## 2017-11-03 DIAGNOSIS — D509 Iron deficiency anemia, unspecified: Secondary | ICD-10-CM | POA: Diagnosis not present

## 2017-11-03 DIAGNOSIS — Z992 Dependence on renal dialysis: Secondary | ICD-10-CM | POA: Diagnosis not present

## 2017-11-03 DIAGNOSIS — N186 End stage renal disease: Secondary | ICD-10-CM | POA: Diagnosis not present

## 2017-11-03 DIAGNOSIS — N2581 Secondary hyperparathyroidism of renal origin: Secondary | ICD-10-CM | POA: Diagnosis not present

## 2017-11-03 DIAGNOSIS — D631 Anemia in chronic kidney disease: Secondary | ICD-10-CM | POA: Diagnosis not present

## 2017-11-06 DIAGNOSIS — N186 End stage renal disease: Secondary | ICD-10-CM | POA: Diagnosis not present

## 2017-11-06 DIAGNOSIS — D631 Anemia in chronic kidney disease: Secondary | ICD-10-CM | POA: Diagnosis not present

## 2017-11-06 DIAGNOSIS — D509 Iron deficiency anemia, unspecified: Secondary | ICD-10-CM | POA: Diagnosis not present

## 2017-11-06 DIAGNOSIS — N2581 Secondary hyperparathyroidism of renal origin: Secondary | ICD-10-CM | POA: Diagnosis not present

## 2017-11-06 DIAGNOSIS — Z992 Dependence on renal dialysis: Secondary | ICD-10-CM | POA: Diagnosis not present

## 2017-11-08 DIAGNOSIS — D631 Anemia in chronic kidney disease: Secondary | ICD-10-CM | POA: Diagnosis not present

## 2017-11-08 DIAGNOSIS — D509 Iron deficiency anemia, unspecified: Secondary | ICD-10-CM | POA: Diagnosis not present

## 2017-11-08 DIAGNOSIS — N186 End stage renal disease: Secondary | ICD-10-CM | POA: Diagnosis not present

## 2017-11-08 DIAGNOSIS — Z992 Dependence on renal dialysis: Secondary | ICD-10-CM | POA: Diagnosis not present

## 2017-11-08 DIAGNOSIS — N2581 Secondary hyperparathyroidism of renal origin: Secondary | ICD-10-CM | POA: Diagnosis not present

## 2017-11-10 DIAGNOSIS — Z992 Dependence on renal dialysis: Secondary | ICD-10-CM | POA: Diagnosis not present

## 2017-11-10 DIAGNOSIS — D509 Iron deficiency anemia, unspecified: Secondary | ICD-10-CM | POA: Diagnosis not present

## 2017-11-10 DIAGNOSIS — N186 End stage renal disease: Secondary | ICD-10-CM | POA: Diagnosis not present

## 2017-11-10 DIAGNOSIS — N2581 Secondary hyperparathyroidism of renal origin: Secondary | ICD-10-CM | POA: Diagnosis not present

## 2017-11-10 DIAGNOSIS — D631 Anemia in chronic kidney disease: Secondary | ICD-10-CM | POA: Diagnosis not present

## 2017-11-13 DIAGNOSIS — D509 Iron deficiency anemia, unspecified: Secondary | ICD-10-CM | POA: Diagnosis not present

## 2017-11-13 DIAGNOSIS — Z992 Dependence on renal dialysis: Secondary | ICD-10-CM | POA: Diagnosis not present

## 2017-11-13 DIAGNOSIS — D631 Anemia in chronic kidney disease: Secondary | ICD-10-CM | POA: Diagnosis not present

## 2017-11-13 DIAGNOSIS — N186 End stage renal disease: Secondary | ICD-10-CM | POA: Diagnosis not present

## 2017-11-13 DIAGNOSIS — N2581 Secondary hyperparathyroidism of renal origin: Secondary | ICD-10-CM | POA: Diagnosis not present

## 2017-11-15 DIAGNOSIS — N186 End stage renal disease: Secondary | ICD-10-CM | POA: Diagnosis not present

## 2017-11-15 DIAGNOSIS — D509 Iron deficiency anemia, unspecified: Secondary | ICD-10-CM | POA: Diagnosis not present

## 2017-11-15 DIAGNOSIS — N2581 Secondary hyperparathyroidism of renal origin: Secondary | ICD-10-CM | POA: Diagnosis not present

## 2017-11-15 DIAGNOSIS — D631 Anemia in chronic kidney disease: Secondary | ICD-10-CM | POA: Diagnosis not present

## 2017-11-15 DIAGNOSIS — Z992 Dependence on renal dialysis: Secondary | ICD-10-CM | POA: Diagnosis not present

## 2017-11-17 DIAGNOSIS — D509 Iron deficiency anemia, unspecified: Secondary | ICD-10-CM | POA: Diagnosis not present

## 2017-11-17 DIAGNOSIS — N186 End stage renal disease: Secondary | ICD-10-CM | POA: Diagnosis not present

## 2017-11-17 DIAGNOSIS — D631 Anemia in chronic kidney disease: Secondary | ICD-10-CM | POA: Diagnosis not present

## 2017-11-17 DIAGNOSIS — Z992 Dependence on renal dialysis: Secondary | ICD-10-CM | POA: Diagnosis not present

## 2017-11-17 DIAGNOSIS — N2581 Secondary hyperparathyroidism of renal origin: Secondary | ICD-10-CM | POA: Diagnosis not present

## 2017-11-20 DIAGNOSIS — D509 Iron deficiency anemia, unspecified: Secondary | ICD-10-CM | POA: Diagnosis not present

## 2017-11-20 DIAGNOSIS — N186 End stage renal disease: Secondary | ICD-10-CM | POA: Diagnosis not present

## 2017-11-20 DIAGNOSIS — N2581 Secondary hyperparathyroidism of renal origin: Secondary | ICD-10-CM | POA: Diagnosis not present

## 2017-11-20 DIAGNOSIS — Z992 Dependence on renal dialysis: Secondary | ICD-10-CM | POA: Diagnosis not present

## 2017-11-20 DIAGNOSIS — D631 Anemia in chronic kidney disease: Secondary | ICD-10-CM | POA: Diagnosis not present

## 2017-11-22 DIAGNOSIS — D631 Anemia in chronic kidney disease: Secondary | ICD-10-CM | POA: Diagnosis not present

## 2017-11-22 DIAGNOSIS — Z992 Dependence on renal dialysis: Secondary | ICD-10-CM | POA: Diagnosis not present

## 2017-11-22 DIAGNOSIS — D509 Iron deficiency anemia, unspecified: Secondary | ICD-10-CM | POA: Diagnosis not present

## 2017-11-22 DIAGNOSIS — N2581 Secondary hyperparathyroidism of renal origin: Secondary | ICD-10-CM | POA: Diagnosis not present

## 2017-11-22 DIAGNOSIS — N186 End stage renal disease: Secondary | ICD-10-CM | POA: Diagnosis not present

## 2017-11-24 DIAGNOSIS — D509 Iron deficiency anemia, unspecified: Secondary | ICD-10-CM | POA: Diagnosis not present

## 2017-11-24 DIAGNOSIS — N186 End stage renal disease: Secondary | ICD-10-CM | POA: Diagnosis not present

## 2017-11-24 DIAGNOSIS — Z992 Dependence on renal dialysis: Secondary | ICD-10-CM | POA: Diagnosis not present

## 2017-11-24 DIAGNOSIS — N2581 Secondary hyperparathyroidism of renal origin: Secondary | ICD-10-CM | POA: Diagnosis not present

## 2017-11-24 DIAGNOSIS — D631 Anemia in chronic kidney disease: Secondary | ICD-10-CM | POA: Diagnosis not present

## 2017-11-27 DIAGNOSIS — Z992 Dependence on renal dialysis: Secondary | ICD-10-CM | POA: Diagnosis not present

## 2017-11-27 DIAGNOSIS — N2581 Secondary hyperparathyroidism of renal origin: Secondary | ICD-10-CM | POA: Diagnosis not present

## 2017-11-27 DIAGNOSIS — D509 Iron deficiency anemia, unspecified: Secondary | ICD-10-CM | POA: Diagnosis not present

## 2017-11-27 DIAGNOSIS — D631 Anemia in chronic kidney disease: Secondary | ICD-10-CM | POA: Diagnosis not present

## 2017-11-27 DIAGNOSIS — N186 End stage renal disease: Secondary | ICD-10-CM | POA: Diagnosis not present

## 2017-11-29 DIAGNOSIS — D509 Iron deficiency anemia, unspecified: Secondary | ICD-10-CM | POA: Diagnosis not present

## 2017-11-29 DIAGNOSIS — N186 End stage renal disease: Secondary | ICD-10-CM | POA: Diagnosis not present

## 2017-11-29 DIAGNOSIS — D631 Anemia in chronic kidney disease: Secondary | ICD-10-CM | POA: Diagnosis not present

## 2017-11-29 DIAGNOSIS — Z992 Dependence on renal dialysis: Secondary | ICD-10-CM | POA: Diagnosis not present

## 2017-11-29 DIAGNOSIS — N2581 Secondary hyperparathyroidism of renal origin: Secondary | ICD-10-CM | POA: Diagnosis not present

## 2017-11-30 DIAGNOSIS — E119 Type 2 diabetes mellitus without complications: Secondary | ICD-10-CM | POA: Diagnosis not present

## 2017-11-30 DIAGNOSIS — I10 Essential (primary) hypertension: Secondary | ICD-10-CM | POA: Diagnosis not present

## 2017-11-30 DIAGNOSIS — E782 Mixed hyperlipidemia: Secondary | ICD-10-CM | POA: Diagnosis not present

## 2017-12-01 DIAGNOSIS — D509 Iron deficiency anemia, unspecified: Secondary | ICD-10-CM | POA: Diagnosis not present

## 2017-12-01 DIAGNOSIS — N186 End stage renal disease: Secondary | ICD-10-CM | POA: Diagnosis not present

## 2017-12-01 DIAGNOSIS — D631 Anemia in chronic kidney disease: Secondary | ICD-10-CM | POA: Diagnosis not present

## 2017-12-01 DIAGNOSIS — Z992 Dependence on renal dialysis: Secondary | ICD-10-CM | POA: Diagnosis not present

## 2017-12-01 DIAGNOSIS — N2581 Secondary hyperparathyroidism of renal origin: Secondary | ICD-10-CM | POA: Diagnosis not present

## 2017-12-02 DIAGNOSIS — N186 End stage renal disease: Secondary | ICD-10-CM | POA: Diagnosis not present

## 2017-12-02 DIAGNOSIS — Z992 Dependence on renal dialysis: Secondary | ICD-10-CM | POA: Diagnosis not present

## 2017-12-04 DIAGNOSIS — D509 Iron deficiency anemia, unspecified: Secondary | ICD-10-CM | POA: Diagnosis not present

## 2017-12-04 DIAGNOSIS — D631 Anemia in chronic kidney disease: Secondary | ICD-10-CM | POA: Diagnosis not present

## 2017-12-04 DIAGNOSIS — N2581 Secondary hyperparathyroidism of renal origin: Secondary | ICD-10-CM | POA: Diagnosis not present

## 2017-12-04 DIAGNOSIS — Z992 Dependence on renal dialysis: Secondary | ICD-10-CM | POA: Diagnosis not present

## 2017-12-04 DIAGNOSIS — N186 End stage renal disease: Secondary | ICD-10-CM | POA: Diagnosis not present

## 2017-12-06 DIAGNOSIS — N2581 Secondary hyperparathyroidism of renal origin: Secondary | ICD-10-CM | POA: Diagnosis not present

## 2017-12-06 DIAGNOSIS — N186 End stage renal disease: Secondary | ICD-10-CM | POA: Diagnosis not present

## 2017-12-06 DIAGNOSIS — D631 Anemia in chronic kidney disease: Secondary | ICD-10-CM | POA: Diagnosis not present

## 2017-12-06 DIAGNOSIS — D509 Iron deficiency anemia, unspecified: Secondary | ICD-10-CM | POA: Diagnosis not present

## 2017-12-06 DIAGNOSIS — Z992 Dependence on renal dialysis: Secondary | ICD-10-CM | POA: Diagnosis not present

## 2017-12-08 DIAGNOSIS — N186 End stage renal disease: Secondary | ICD-10-CM | POA: Diagnosis not present

## 2017-12-08 DIAGNOSIS — Z992 Dependence on renal dialysis: Secondary | ICD-10-CM | POA: Diagnosis not present

## 2017-12-08 DIAGNOSIS — D509 Iron deficiency anemia, unspecified: Secondary | ICD-10-CM | POA: Diagnosis not present

## 2017-12-08 DIAGNOSIS — D631 Anemia in chronic kidney disease: Secondary | ICD-10-CM | POA: Diagnosis not present

## 2017-12-08 DIAGNOSIS — N2581 Secondary hyperparathyroidism of renal origin: Secondary | ICD-10-CM | POA: Diagnosis not present

## 2017-12-11 DIAGNOSIS — Z992 Dependence on renal dialysis: Secondary | ICD-10-CM | POA: Diagnosis not present

## 2017-12-11 DIAGNOSIS — D509 Iron deficiency anemia, unspecified: Secondary | ICD-10-CM | POA: Diagnosis not present

## 2017-12-11 DIAGNOSIS — D631 Anemia in chronic kidney disease: Secondary | ICD-10-CM | POA: Diagnosis not present

## 2017-12-11 DIAGNOSIS — N2581 Secondary hyperparathyroidism of renal origin: Secondary | ICD-10-CM | POA: Diagnosis not present

## 2017-12-11 DIAGNOSIS — N186 End stage renal disease: Secondary | ICD-10-CM | POA: Diagnosis not present

## 2017-12-13 DIAGNOSIS — D631 Anemia in chronic kidney disease: Secondary | ICD-10-CM | POA: Diagnosis not present

## 2017-12-13 DIAGNOSIS — Z992 Dependence on renal dialysis: Secondary | ICD-10-CM | POA: Diagnosis not present

## 2017-12-13 DIAGNOSIS — N186 End stage renal disease: Secondary | ICD-10-CM | POA: Diagnosis not present

## 2017-12-13 DIAGNOSIS — N2581 Secondary hyperparathyroidism of renal origin: Secondary | ICD-10-CM | POA: Diagnosis not present

## 2017-12-13 DIAGNOSIS — D509 Iron deficiency anemia, unspecified: Secondary | ICD-10-CM | POA: Diagnosis not present

## 2017-12-15 DIAGNOSIS — Z992 Dependence on renal dialysis: Secondary | ICD-10-CM | POA: Diagnosis not present

## 2017-12-15 DIAGNOSIS — N186 End stage renal disease: Secondary | ICD-10-CM | POA: Diagnosis not present

## 2017-12-15 DIAGNOSIS — D631 Anemia in chronic kidney disease: Secondary | ICD-10-CM | POA: Diagnosis not present

## 2017-12-15 DIAGNOSIS — D509 Iron deficiency anemia, unspecified: Secondary | ICD-10-CM | POA: Diagnosis not present

## 2017-12-15 DIAGNOSIS — N2581 Secondary hyperparathyroidism of renal origin: Secondary | ICD-10-CM | POA: Diagnosis not present

## 2017-12-18 DIAGNOSIS — N186 End stage renal disease: Secondary | ICD-10-CM | POA: Diagnosis not present

## 2017-12-18 DIAGNOSIS — D509 Iron deficiency anemia, unspecified: Secondary | ICD-10-CM | POA: Diagnosis not present

## 2017-12-18 DIAGNOSIS — D631 Anemia in chronic kidney disease: Secondary | ICD-10-CM | POA: Diagnosis not present

## 2017-12-18 DIAGNOSIS — N2581 Secondary hyperparathyroidism of renal origin: Secondary | ICD-10-CM | POA: Diagnosis not present

## 2017-12-18 DIAGNOSIS — Z992 Dependence on renal dialysis: Secondary | ICD-10-CM | POA: Diagnosis not present

## 2017-12-20 DIAGNOSIS — N2581 Secondary hyperparathyroidism of renal origin: Secondary | ICD-10-CM | POA: Diagnosis not present

## 2017-12-20 DIAGNOSIS — N186 End stage renal disease: Secondary | ICD-10-CM | POA: Diagnosis not present

## 2017-12-20 DIAGNOSIS — Z992 Dependence on renal dialysis: Secondary | ICD-10-CM | POA: Diagnosis not present

## 2017-12-20 DIAGNOSIS — D509 Iron deficiency anemia, unspecified: Secondary | ICD-10-CM | POA: Diagnosis not present

## 2017-12-20 DIAGNOSIS — D631 Anemia in chronic kidney disease: Secondary | ICD-10-CM | POA: Diagnosis not present

## 2017-12-22 DIAGNOSIS — Z992 Dependence on renal dialysis: Secondary | ICD-10-CM | POA: Diagnosis not present

## 2017-12-22 DIAGNOSIS — N186 End stage renal disease: Secondary | ICD-10-CM | POA: Diagnosis not present

## 2017-12-22 DIAGNOSIS — N2581 Secondary hyperparathyroidism of renal origin: Secondary | ICD-10-CM | POA: Diagnosis not present

## 2017-12-22 DIAGNOSIS — D509 Iron deficiency anemia, unspecified: Secondary | ICD-10-CM | POA: Diagnosis not present

## 2017-12-22 DIAGNOSIS — D631 Anemia in chronic kidney disease: Secondary | ICD-10-CM | POA: Diagnosis not present

## 2017-12-25 DIAGNOSIS — D509 Iron deficiency anemia, unspecified: Secondary | ICD-10-CM | POA: Diagnosis not present

## 2017-12-25 DIAGNOSIS — N2581 Secondary hyperparathyroidism of renal origin: Secondary | ICD-10-CM | POA: Diagnosis not present

## 2017-12-25 DIAGNOSIS — D631 Anemia in chronic kidney disease: Secondary | ICD-10-CM | POA: Diagnosis not present

## 2017-12-25 DIAGNOSIS — Z992 Dependence on renal dialysis: Secondary | ICD-10-CM | POA: Diagnosis not present

## 2017-12-25 DIAGNOSIS — N186 End stage renal disease: Secondary | ICD-10-CM | POA: Diagnosis not present

## 2017-12-27 DIAGNOSIS — N186 End stage renal disease: Secondary | ICD-10-CM | POA: Diagnosis not present

## 2017-12-27 DIAGNOSIS — N2581 Secondary hyperparathyroidism of renal origin: Secondary | ICD-10-CM | POA: Diagnosis not present

## 2017-12-27 DIAGNOSIS — D631 Anemia in chronic kidney disease: Secondary | ICD-10-CM | POA: Diagnosis not present

## 2017-12-27 DIAGNOSIS — D509 Iron deficiency anemia, unspecified: Secondary | ICD-10-CM | POA: Diagnosis not present

## 2017-12-27 DIAGNOSIS — Z992 Dependence on renal dialysis: Secondary | ICD-10-CM | POA: Diagnosis not present

## 2017-12-28 DIAGNOSIS — E782 Mixed hyperlipidemia: Secondary | ICD-10-CM | POA: Diagnosis not present

## 2017-12-28 DIAGNOSIS — N186 End stage renal disease: Secondary | ICD-10-CM | POA: Diagnosis not present

## 2017-12-28 DIAGNOSIS — E119 Type 2 diabetes mellitus without complications: Secondary | ICD-10-CM | POA: Diagnosis not present

## 2017-12-28 DIAGNOSIS — I10 Essential (primary) hypertension: Secondary | ICD-10-CM | POA: Diagnosis not present

## 2017-12-29 DIAGNOSIS — N2581 Secondary hyperparathyroidism of renal origin: Secondary | ICD-10-CM | POA: Diagnosis not present

## 2017-12-29 DIAGNOSIS — E119 Type 2 diabetes mellitus without complications: Secondary | ICD-10-CM | POA: Diagnosis not present

## 2017-12-29 DIAGNOSIS — Z992 Dependence on renal dialysis: Secondary | ICD-10-CM | POA: Diagnosis not present

## 2017-12-29 DIAGNOSIS — N186 End stage renal disease: Secondary | ICD-10-CM | POA: Diagnosis not present

## 2017-12-29 DIAGNOSIS — Z794 Long term (current) use of insulin: Secondary | ICD-10-CM | POA: Diagnosis not present

## 2017-12-29 DIAGNOSIS — D509 Iron deficiency anemia, unspecified: Secondary | ICD-10-CM | POA: Diagnosis not present

## 2017-12-29 DIAGNOSIS — D631 Anemia in chronic kidney disease: Secondary | ICD-10-CM | POA: Diagnosis not present

## 2018-01-01 DIAGNOSIS — N2581 Secondary hyperparathyroidism of renal origin: Secondary | ICD-10-CM | POA: Diagnosis not present

## 2018-01-01 DIAGNOSIS — Z992 Dependence on renal dialysis: Secondary | ICD-10-CM | POA: Diagnosis not present

## 2018-01-01 DIAGNOSIS — D509 Iron deficiency anemia, unspecified: Secondary | ICD-10-CM | POA: Diagnosis not present

## 2018-01-01 DIAGNOSIS — D631 Anemia in chronic kidney disease: Secondary | ICD-10-CM | POA: Diagnosis not present

## 2018-01-01 DIAGNOSIS — N186 End stage renal disease: Secondary | ICD-10-CM | POA: Diagnosis not present

## 2018-01-03 DIAGNOSIS — N186 End stage renal disease: Secondary | ICD-10-CM | POA: Diagnosis not present

## 2018-01-03 DIAGNOSIS — N2581 Secondary hyperparathyroidism of renal origin: Secondary | ICD-10-CM | POA: Diagnosis not present

## 2018-01-03 DIAGNOSIS — D509 Iron deficiency anemia, unspecified: Secondary | ICD-10-CM | POA: Diagnosis not present

## 2018-01-03 DIAGNOSIS — D631 Anemia in chronic kidney disease: Secondary | ICD-10-CM | POA: Diagnosis not present

## 2018-01-03 DIAGNOSIS — Z992 Dependence on renal dialysis: Secondary | ICD-10-CM | POA: Diagnosis not present

## 2018-01-03 DIAGNOSIS — Z23 Encounter for immunization: Secondary | ICD-10-CM | POA: Diagnosis not present

## 2018-01-05 DIAGNOSIS — N186 End stage renal disease: Secondary | ICD-10-CM | POA: Diagnosis not present

## 2018-01-05 DIAGNOSIS — Z992 Dependence on renal dialysis: Secondary | ICD-10-CM | POA: Diagnosis not present

## 2018-01-05 DIAGNOSIS — Z23 Encounter for immunization: Secondary | ICD-10-CM | POA: Diagnosis not present

## 2018-01-05 DIAGNOSIS — D509 Iron deficiency anemia, unspecified: Secondary | ICD-10-CM | POA: Diagnosis not present

## 2018-01-05 DIAGNOSIS — D631 Anemia in chronic kidney disease: Secondary | ICD-10-CM | POA: Diagnosis not present

## 2018-01-05 DIAGNOSIS — N2581 Secondary hyperparathyroidism of renal origin: Secondary | ICD-10-CM | POA: Diagnosis not present

## 2018-01-08 DIAGNOSIS — Z23 Encounter for immunization: Secondary | ICD-10-CM | POA: Diagnosis not present

## 2018-01-08 DIAGNOSIS — N186 End stage renal disease: Secondary | ICD-10-CM | POA: Diagnosis not present

## 2018-01-08 DIAGNOSIS — D509 Iron deficiency anemia, unspecified: Secondary | ICD-10-CM | POA: Diagnosis not present

## 2018-01-08 DIAGNOSIS — D631 Anemia in chronic kidney disease: Secondary | ICD-10-CM | POA: Diagnosis not present

## 2018-01-08 DIAGNOSIS — Z992 Dependence on renal dialysis: Secondary | ICD-10-CM | POA: Diagnosis not present

## 2018-01-08 DIAGNOSIS — N2581 Secondary hyperparathyroidism of renal origin: Secondary | ICD-10-CM | POA: Diagnosis not present

## 2018-01-10 DIAGNOSIS — Z992 Dependence on renal dialysis: Secondary | ICD-10-CM | POA: Diagnosis not present

## 2018-01-10 DIAGNOSIS — N2581 Secondary hyperparathyroidism of renal origin: Secondary | ICD-10-CM | POA: Diagnosis not present

## 2018-01-10 DIAGNOSIS — N186 End stage renal disease: Secondary | ICD-10-CM | POA: Diagnosis not present

## 2018-01-10 DIAGNOSIS — Z23 Encounter for immunization: Secondary | ICD-10-CM | POA: Diagnosis not present

## 2018-01-10 DIAGNOSIS — D509 Iron deficiency anemia, unspecified: Secondary | ICD-10-CM | POA: Diagnosis not present

## 2018-01-10 DIAGNOSIS — D631 Anemia in chronic kidney disease: Secondary | ICD-10-CM | POA: Diagnosis not present

## 2018-01-12 DIAGNOSIS — Z23 Encounter for immunization: Secondary | ICD-10-CM | POA: Diagnosis not present

## 2018-01-12 DIAGNOSIS — D631 Anemia in chronic kidney disease: Secondary | ICD-10-CM | POA: Diagnosis not present

## 2018-01-12 DIAGNOSIS — N186 End stage renal disease: Secondary | ICD-10-CM | POA: Diagnosis not present

## 2018-01-12 DIAGNOSIS — D509 Iron deficiency anemia, unspecified: Secondary | ICD-10-CM | POA: Diagnosis not present

## 2018-01-12 DIAGNOSIS — Z992 Dependence on renal dialysis: Secondary | ICD-10-CM | POA: Diagnosis not present

## 2018-01-12 DIAGNOSIS — N2581 Secondary hyperparathyroidism of renal origin: Secondary | ICD-10-CM | POA: Diagnosis not present

## 2018-01-15 DIAGNOSIS — Z992 Dependence on renal dialysis: Secondary | ICD-10-CM | POA: Diagnosis not present

## 2018-01-15 DIAGNOSIS — Z23 Encounter for immunization: Secondary | ICD-10-CM | POA: Diagnosis not present

## 2018-01-15 DIAGNOSIS — N186 End stage renal disease: Secondary | ICD-10-CM | POA: Diagnosis not present

## 2018-01-15 DIAGNOSIS — N2581 Secondary hyperparathyroidism of renal origin: Secondary | ICD-10-CM | POA: Diagnosis not present

## 2018-01-15 DIAGNOSIS — D509 Iron deficiency anemia, unspecified: Secondary | ICD-10-CM | POA: Diagnosis not present

## 2018-01-15 DIAGNOSIS — D631 Anemia in chronic kidney disease: Secondary | ICD-10-CM | POA: Diagnosis not present

## 2018-01-15 DIAGNOSIS — Z131 Encounter for screening for diabetes mellitus: Secondary | ICD-10-CM | POA: Diagnosis not present

## 2018-01-17 DIAGNOSIS — Z23 Encounter for immunization: Secondary | ICD-10-CM | POA: Diagnosis not present

## 2018-01-17 DIAGNOSIS — D509 Iron deficiency anemia, unspecified: Secondary | ICD-10-CM | POA: Diagnosis not present

## 2018-01-17 DIAGNOSIS — N186 End stage renal disease: Secondary | ICD-10-CM | POA: Diagnosis not present

## 2018-01-17 DIAGNOSIS — D631 Anemia in chronic kidney disease: Secondary | ICD-10-CM | POA: Diagnosis not present

## 2018-01-17 DIAGNOSIS — Z992 Dependence on renal dialysis: Secondary | ICD-10-CM | POA: Diagnosis not present

## 2018-01-17 DIAGNOSIS — N2581 Secondary hyperparathyroidism of renal origin: Secondary | ICD-10-CM | POA: Diagnosis not present

## 2018-01-19 DIAGNOSIS — Z23 Encounter for immunization: Secondary | ICD-10-CM | POA: Diagnosis not present

## 2018-01-19 DIAGNOSIS — D509 Iron deficiency anemia, unspecified: Secondary | ICD-10-CM | POA: Diagnosis not present

## 2018-01-19 DIAGNOSIS — N186 End stage renal disease: Secondary | ICD-10-CM | POA: Diagnosis not present

## 2018-01-19 DIAGNOSIS — D631 Anemia in chronic kidney disease: Secondary | ICD-10-CM | POA: Diagnosis not present

## 2018-01-19 DIAGNOSIS — N2581 Secondary hyperparathyroidism of renal origin: Secondary | ICD-10-CM | POA: Diagnosis not present

## 2018-01-19 DIAGNOSIS — Z992 Dependence on renal dialysis: Secondary | ICD-10-CM | POA: Diagnosis not present

## 2018-01-22 DIAGNOSIS — N186 End stage renal disease: Secondary | ICD-10-CM | POA: Diagnosis not present

## 2018-01-22 DIAGNOSIS — D631 Anemia in chronic kidney disease: Secondary | ICD-10-CM | POA: Diagnosis not present

## 2018-01-22 DIAGNOSIS — N2581 Secondary hyperparathyroidism of renal origin: Secondary | ICD-10-CM | POA: Diagnosis not present

## 2018-01-22 DIAGNOSIS — D509 Iron deficiency anemia, unspecified: Secondary | ICD-10-CM | POA: Diagnosis not present

## 2018-01-22 DIAGNOSIS — Z23 Encounter for immunization: Secondary | ICD-10-CM | POA: Diagnosis not present

## 2018-01-22 DIAGNOSIS — Z992 Dependence on renal dialysis: Secondary | ICD-10-CM | POA: Diagnosis not present

## 2018-01-24 DIAGNOSIS — D631 Anemia in chronic kidney disease: Secondary | ICD-10-CM | POA: Diagnosis not present

## 2018-01-24 DIAGNOSIS — N186 End stage renal disease: Secondary | ICD-10-CM | POA: Diagnosis not present

## 2018-01-24 DIAGNOSIS — Z992 Dependence on renal dialysis: Secondary | ICD-10-CM | POA: Diagnosis not present

## 2018-01-24 DIAGNOSIS — Z23 Encounter for immunization: Secondary | ICD-10-CM | POA: Diagnosis not present

## 2018-01-24 DIAGNOSIS — N2581 Secondary hyperparathyroidism of renal origin: Secondary | ICD-10-CM | POA: Diagnosis not present

## 2018-01-24 DIAGNOSIS — D509 Iron deficiency anemia, unspecified: Secondary | ICD-10-CM | POA: Diagnosis not present

## 2018-01-26 DIAGNOSIS — Z23 Encounter for immunization: Secondary | ICD-10-CM | POA: Diagnosis not present

## 2018-01-26 DIAGNOSIS — D631 Anemia in chronic kidney disease: Secondary | ICD-10-CM | POA: Diagnosis not present

## 2018-01-26 DIAGNOSIS — N2581 Secondary hyperparathyroidism of renal origin: Secondary | ICD-10-CM | POA: Diagnosis not present

## 2018-01-26 DIAGNOSIS — Z992 Dependence on renal dialysis: Secondary | ICD-10-CM | POA: Diagnosis not present

## 2018-01-26 DIAGNOSIS — D509 Iron deficiency anemia, unspecified: Secondary | ICD-10-CM | POA: Diagnosis not present

## 2018-01-26 DIAGNOSIS — N186 End stage renal disease: Secondary | ICD-10-CM | POA: Diagnosis not present

## 2018-01-29 DIAGNOSIS — N2581 Secondary hyperparathyroidism of renal origin: Secondary | ICD-10-CM | POA: Diagnosis not present

## 2018-01-29 DIAGNOSIS — Z992 Dependence on renal dialysis: Secondary | ICD-10-CM | POA: Diagnosis not present

## 2018-01-29 DIAGNOSIS — D631 Anemia in chronic kidney disease: Secondary | ICD-10-CM | POA: Diagnosis not present

## 2018-01-29 DIAGNOSIS — D509 Iron deficiency anemia, unspecified: Secondary | ICD-10-CM | POA: Diagnosis not present

## 2018-01-29 DIAGNOSIS — N186 End stage renal disease: Secondary | ICD-10-CM | POA: Diagnosis not present

## 2018-01-29 DIAGNOSIS — Z23 Encounter for immunization: Secondary | ICD-10-CM | POA: Diagnosis not present

## 2018-01-31 DIAGNOSIS — N186 End stage renal disease: Secondary | ICD-10-CM | POA: Diagnosis not present

## 2018-01-31 DIAGNOSIS — Z23 Encounter for immunization: Secondary | ICD-10-CM | POA: Diagnosis not present

## 2018-01-31 DIAGNOSIS — D509 Iron deficiency anemia, unspecified: Secondary | ICD-10-CM | POA: Diagnosis not present

## 2018-01-31 DIAGNOSIS — N2581 Secondary hyperparathyroidism of renal origin: Secondary | ICD-10-CM | POA: Diagnosis not present

## 2018-01-31 DIAGNOSIS — Z992 Dependence on renal dialysis: Secondary | ICD-10-CM | POA: Diagnosis not present

## 2018-01-31 DIAGNOSIS — D631 Anemia in chronic kidney disease: Secondary | ICD-10-CM | POA: Diagnosis not present

## 2018-02-01 DIAGNOSIS — Z992 Dependence on renal dialysis: Secondary | ICD-10-CM | POA: Diagnosis not present

## 2018-02-01 DIAGNOSIS — N186 End stage renal disease: Secondary | ICD-10-CM | POA: Diagnosis not present

## 2018-02-02 DIAGNOSIS — D509 Iron deficiency anemia, unspecified: Secondary | ICD-10-CM | POA: Diagnosis not present

## 2018-02-02 DIAGNOSIS — N186 End stage renal disease: Secondary | ICD-10-CM | POA: Diagnosis not present

## 2018-02-02 DIAGNOSIS — Z992 Dependence on renal dialysis: Secondary | ICD-10-CM | POA: Diagnosis not present

## 2018-02-02 DIAGNOSIS — N2581 Secondary hyperparathyroidism of renal origin: Secondary | ICD-10-CM | POA: Diagnosis not present

## 2018-02-05 DIAGNOSIS — N186 End stage renal disease: Secondary | ICD-10-CM | POA: Diagnosis not present

## 2018-02-05 DIAGNOSIS — D509 Iron deficiency anemia, unspecified: Secondary | ICD-10-CM | POA: Diagnosis not present

## 2018-02-05 DIAGNOSIS — Z992 Dependence on renal dialysis: Secondary | ICD-10-CM | POA: Diagnosis not present

## 2018-02-05 DIAGNOSIS — N2581 Secondary hyperparathyroidism of renal origin: Secondary | ICD-10-CM | POA: Diagnosis not present

## 2018-02-06 DIAGNOSIS — Z794 Long term (current) use of insulin: Secondary | ICD-10-CM | POA: Diagnosis not present

## 2018-02-06 DIAGNOSIS — I10 Essential (primary) hypertension: Secondary | ICD-10-CM | POA: Diagnosis not present

## 2018-02-06 DIAGNOSIS — E1165 Type 2 diabetes mellitus with hyperglycemia: Secondary | ICD-10-CM | POA: Diagnosis not present

## 2018-02-06 DIAGNOSIS — N186 End stage renal disease: Secondary | ICD-10-CM | POA: Diagnosis not present

## 2018-02-07 DIAGNOSIS — D509 Iron deficiency anemia, unspecified: Secondary | ICD-10-CM | POA: Diagnosis not present

## 2018-02-07 DIAGNOSIS — N2581 Secondary hyperparathyroidism of renal origin: Secondary | ICD-10-CM | POA: Diagnosis not present

## 2018-02-07 DIAGNOSIS — Z992 Dependence on renal dialysis: Secondary | ICD-10-CM | POA: Diagnosis not present

## 2018-02-07 DIAGNOSIS — N186 End stage renal disease: Secondary | ICD-10-CM | POA: Diagnosis not present

## 2018-02-09 DIAGNOSIS — N186 End stage renal disease: Secondary | ICD-10-CM | POA: Diagnosis not present

## 2018-02-09 DIAGNOSIS — D509 Iron deficiency anemia, unspecified: Secondary | ICD-10-CM | POA: Diagnosis not present

## 2018-02-09 DIAGNOSIS — N2581 Secondary hyperparathyroidism of renal origin: Secondary | ICD-10-CM | POA: Diagnosis not present

## 2018-02-09 DIAGNOSIS — Z992 Dependence on renal dialysis: Secondary | ICD-10-CM | POA: Diagnosis not present

## 2018-02-12 DIAGNOSIS — N186 End stage renal disease: Secondary | ICD-10-CM | POA: Diagnosis not present

## 2018-02-12 DIAGNOSIS — Z992 Dependence on renal dialysis: Secondary | ICD-10-CM | POA: Diagnosis not present

## 2018-02-12 DIAGNOSIS — D509 Iron deficiency anemia, unspecified: Secondary | ICD-10-CM | POA: Diagnosis not present

## 2018-02-12 DIAGNOSIS — N2581 Secondary hyperparathyroidism of renal origin: Secondary | ICD-10-CM | POA: Diagnosis not present

## 2018-02-14 DIAGNOSIS — Z992 Dependence on renal dialysis: Secondary | ICD-10-CM | POA: Diagnosis not present

## 2018-02-14 DIAGNOSIS — N2581 Secondary hyperparathyroidism of renal origin: Secondary | ICD-10-CM | POA: Diagnosis not present

## 2018-02-14 DIAGNOSIS — N186 End stage renal disease: Secondary | ICD-10-CM | POA: Diagnosis not present

## 2018-02-14 DIAGNOSIS — D509 Iron deficiency anemia, unspecified: Secondary | ICD-10-CM | POA: Diagnosis not present

## 2018-02-16 DIAGNOSIS — Z992 Dependence on renal dialysis: Secondary | ICD-10-CM | POA: Diagnosis not present

## 2018-02-16 DIAGNOSIS — N186 End stage renal disease: Secondary | ICD-10-CM | POA: Diagnosis not present

## 2018-02-16 DIAGNOSIS — D509 Iron deficiency anemia, unspecified: Secondary | ICD-10-CM | POA: Diagnosis not present

## 2018-02-16 DIAGNOSIS — N2581 Secondary hyperparathyroidism of renal origin: Secondary | ICD-10-CM | POA: Diagnosis not present

## 2018-02-19 DIAGNOSIS — N186 End stage renal disease: Secondary | ICD-10-CM | POA: Diagnosis not present

## 2018-02-19 DIAGNOSIS — D509 Iron deficiency anemia, unspecified: Secondary | ICD-10-CM | POA: Diagnosis not present

## 2018-02-19 DIAGNOSIS — N2581 Secondary hyperparathyroidism of renal origin: Secondary | ICD-10-CM | POA: Diagnosis not present

## 2018-02-19 DIAGNOSIS — Z992 Dependence on renal dialysis: Secondary | ICD-10-CM | POA: Diagnosis not present

## 2018-02-21 DIAGNOSIS — N2581 Secondary hyperparathyroidism of renal origin: Secondary | ICD-10-CM | POA: Diagnosis not present

## 2018-02-21 DIAGNOSIS — N186 End stage renal disease: Secondary | ICD-10-CM | POA: Diagnosis not present

## 2018-02-21 DIAGNOSIS — Z992 Dependence on renal dialysis: Secondary | ICD-10-CM | POA: Diagnosis not present

## 2018-02-21 DIAGNOSIS — D509 Iron deficiency anemia, unspecified: Secondary | ICD-10-CM | POA: Diagnosis not present

## 2018-02-23 DIAGNOSIS — D509 Iron deficiency anemia, unspecified: Secondary | ICD-10-CM | POA: Diagnosis not present

## 2018-02-23 DIAGNOSIS — N2581 Secondary hyperparathyroidism of renal origin: Secondary | ICD-10-CM | POA: Diagnosis not present

## 2018-02-23 DIAGNOSIS — N186 End stage renal disease: Secondary | ICD-10-CM | POA: Diagnosis not present

## 2018-02-23 DIAGNOSIS — Z992 Dependence on renal dialysis: Secondary | ICD-10-CM | POA: Diagnosis not present

## 2018-02-26 DIAGNOSIS — N2581 Secondary hyperparathyroidism of renal origin: Secondary | ICD-10-CM | POA: Diagnosis not present

## 2018-02-26 DIAGNOSIS — D509 Iron deficiency anemia, unspecified: Secondary | ICD-10-CM | POA: Diagnosis not present

## 2018-02-26 DIAGNOSIS — Z992 Dependence on renal dialysis: Secondary | ICD-10-CM | POA: Diagnosis not present

## 2018-02-26 DIAGNOSIS — N186 End stage renal disease: Secondary | ICD-10-CM | POA: Diagnosis not present

## 2018-02-28 DIAGNOSIS — N2581 Secondary hyperparathyroidism of renal origin: Secondary | ICD-10-CM | POA: Diagnosis not present

## 2018-02-28 DIAGNOSIS — D509 Iron deficiency anemia, unspecified: Secondary | ICD-10-CM | POA: Diagnosis not present

## 2018-02-28 DIAGNOSIS — Z992 Dependence on renal dialysis: Secondary | ICD-10-CM | POA: Diagnosis not present

## 2018-02-28 DIAGNOSIS — N186 End stage renal disease: Secondary | ICD-10-CM | POA: Diagnosis not present

## 2018-03-02 DIAGNOSIS — Z992 Dependence on renal dialysis: Secondary | ICD-10-CM | POA: Diagnosis not present

## 2018-03-02 DIAGNOSIS — N2581 Secondary hyperparathyroidism of renal origin: Secondary | ICD-10-CM | POA: Diagnosis not present

## 2018-03-02 DIAGNOSIS — D509 Iron deficiency anemia, unspecified: Secondary | ICD-10-CM | POA: Diagnosis not present

## 2018-03-02 DIAGNOSIS — N186 End stage renal disease: Secondary | ICD-10-CM | POA: Diagnosis not present

## 2018-03-03 DIAGNOSIS — N186 End stage renal disease: Secondary | ICD-10-CM | POA: Diagnosis not present

## 2018-03-03 DIAGNOSIS — Z992 Dependence on renal dialysis: Secondary | ICD-10-CM | POA: Diagnosis not present

## 2018-03-05 DIAGNOSIS — N186 End stage renal disease: Secondary | ICD-10-CM | POA: Diagnosis not present

## 2018-03-05 DIAGNOSIS — N2581 Secondary hyperparathyroidism of renal origin: Secondary | ICD-10-CM | POA: Diagnosis not present

## 2018-03-05 DIAGNOSIS — D631 Anemia in chronic kidney disease: Secondary | ICD-10-CM | POA: Diagnosis not present

## 2018-03-05 DIAGNOSIS — Z992 Dependence on renal dialysis: Secondary | ICD-10-CM | POA: Diagnosis not present

## 2018-03-05 DIAGNOSIS — D509 Iron deficiency anemia, unspecified: Secondary | ICD-10-CM | POA: Diagnosis not present

## 2018-03-07 DIAGNOSIS — N186 End stage renal disease: Secondary | ICD-10-CM | POA: Diagnosis not present

## 2018-03-07 DIAGNOSIS — D509 Iron deficiency anemia, unspecified: Secondary | ICD-10-CM | POA: Diagnosis not present

## 2018-03-07 DIAGNOSIS — N2581 Secondary hyperparathyroidism of renal origin: Secondary | ICD-10-CM | POA: Diagnosis not present

## 2018-03-07 DIAGNOSIS — D631 Anemia in chronic kidney disease: Secondary | ICD-10-CM | POA: Diagnosis not present

## 2018-03-07 DIAGNOSIS — Z992 Dependence on renal dialysis: Secondary | ICD-10-CM | POA: Diagnosis not present

## 2018-03-09 DIAGNOSIS — Z992 Dependence on renal dialysis: Secondary | ICD-10-CM | POA: Diagnosis not present

## 2018-03-09 DIAGNOSIS — D631 Anemia in chronic kidney disease: Secondary | ICD-10-CM | POA: Diagnosis not present

## 2018-03-09 DIAGNOSIS — N2581 Secondary hyperparathyroidism of renal origin: Secondary | ICD-10-CM | POA: Diagnosis not present

## 2018-03-09 DIAGNOSIS — N186 End stage renal disease: Secondary | ICD-10-CM | POA: Diagnosis not present

## 2018-03-09 DIAGNOSIS — D509 Iron deficiency anemia, unspecified: Secondary | ICD-10-CM | POA: Diagnosis not present

## 2018-03-12 DIAGNOSIS — N2581 Secondary hyperparathyroidism of renal origin: Secondary | ICD-10-CM | POA: Diagnosis not present

## 2018-03-12 DIAGNOSIS — N186 End stage renal disease: Secondary | ICD-10-CM | POA: Diagnosis not present

## 2018-03-12 DIAGNOSIS — D509 Iron deficiency anemia, unspecified: Secondary | ICD-10-CM | POA: Diagnosis not present

## 2018-03-12 DIAGNOSIS — D631 Anemia in chronic kidney disease: Secondary | ICD-10-CM | POA: Diagnosis not present

## 2018-03-12 DIAGNOSIS — Z992 Dependence on renal dialysis: Secondary | ICD-10-CM | POA: Diagnosis not present

## 2018-03-14 DIAGNOSIS — N2581 Secondary hyperparathyroidism of renal origin: Secondary | ICD-10-CM | POA: Diagnosis not present

## 2018-03-14 DIAGNOSIS — Z992 Dependence on renal dialysis: Secondary | ICD-10-CM | POA: Diagnosis not present

## 2018-03-14 DIAGNOSIS — D509 Iron deficiency anemia, unspecified: Secondary | ICD-10-CM | POA: Diagnosis not present

## 2018-03-14 DIAGNOSIS — D631 Anemia in chronic kidney disease: Secondary | ICD-10-CM | POA: Diagnosis not present

## 2018-03-14 DIAGNOSIS — N186 End stage renal disease: Secondary | ICD-10-CM | POA: Diagnosis not present

## 2018-03-16 DIAGNOSIS — N186 End stage renal disease: Secondary | ICD-10-CM | POA: Diagnosis not present

## 2018-03-16 DIAGNOSIS — N2581 Secondary hyperparathyroidism of renal origin: Secondary | ICD-10-CM | POA: Diagnosis not present

## 2018-03-16 DIAGNOSIS — Z992 Dependence on renal dialysis: Secondary | ICD-10-CM | POA: Diagnosis not present

## 2018-03-16 DIAGNOSIS — D631 Anemia in chronic kidney disease: Secondary | ICD-10-CM | POA: Diagnosis not present

## 2018-03-16 DIAGNOSIS — D509 Iron deficiency anemia, unspecified: Secondary | ICD-10-CM | POA: Diagnosis not present

## 2018-03-19 DIAGNOSIS — D631 Anemia in chronic kidney disease: Secondary | ICD-10-CM | POA: Diagnosis not present

## 2018-03-19 DIAGNOSIS — N2581 Secondary hyperparathyroidism of renal origin: Secondary | ICD-10-CM | POA: Diagnosis not present

## 2018-03-19 DIAGNOSIS — D509 Iron deficiency anemia, unspecified: Secondary | ICD-10-CM | POA: Diagnosis not present

## 2018-03-19 DIAGNOSIS — Z992 Dependence on renal dialysis: Secondary | ICD-10-CM | POA: Diagnosis not present

## 2018-03-19 DIAGNOSIS — N186 End stage renal disease: Secondary | ICD-10-CM | POA: Diagnosis not present

## 2018-03-21 DIAGNOSIS — D509 Iron deficiency anemia, unspecified: Secondary | ICD-10-CM | POA: Diagnosis not present

## 2018-03-21 DIAGNOSIS — D631 Anemia in chronic kidney disease: Secondary | ICD-10-CM | POA: Diagnosis not present

## 2018-03-21 DIAGNOSIS — N186 End stage renal disease: Secondary | ICD-10-CM | POA: Diagnosis not present

## 2018-03-21 DIAGNOSIS — Z992 Dependence on renal dialysis: Secondary | ICD-10-CM | POA: Diagnosis not present

## 2018-03-21 DIAGNOSIS — N2581 Secondary hyperparathyroidism of renal origin: Secondary | ICD-10-CM | POA: Diagnosis not present

## 2018-03-23 DIAGNOSIS — Z992 Dependence on renal dialysis: Secondary | ICD-10-CM | POA: Diagnosis not present

## 2018-03-23 DIAGNOSIS — D509 Iron deficiency anemia, unspecified: Secondary | ICD-10-CM | POA: Diagnosis not present

## 2018-03-23 DIAGNOSIS — D631 Anemia in chronic kidney disease: Secondary | ICD-10-CM | POA: Diagnosis not present

## 2018-03-23 DIAGNOSIS — N2581 Secondary hyperparathyroidism of renal origin: Secondary | ICD-10-CM | POA: Diagnosis not present

## 2018-03-23 DIAGNOSIS — N186 End stage renal disease: Secondary | ICD-10-CM | POA: Diagnosis not present

## 2018-03-25 DIAGNOSIS — N186 End stage renal disease: Secondary | ICD-10-CM | POA: Diagnosis not present

## 2018-03-25 DIAGNOSIS — D631 Anemia in chronic kidney disease: Secondary | ICD-10-CM | POA: Diagnosis not present

## 2018-03-25 DIAGNOSIS — Z992 Dependence on renal dialysis: Secondary | ICD-10-CM | POA: Diagnosis not present

## 2018-03-25 DIAGNOSIS — N2581 Secondary hyperparathyroidism of renal origin: Secondary | ICD-10-CM | POA: Diagnosis not present

## 2018-03-25 DIAGNOSIS — D509 Iron deficiency anemia, unspecified: Secondary | ICD-10-CM | POA: Diagnosis not present

## 2018-03-27 DIAGNOSIS — N186 End stage renal disease: Secondary | ICD-10-CM | POA: Diagnosis not present

## 2018-03-27 DIAGNOSIS — Z992 Dependence on renal dialysis: Secondary | ICD-10-CM | POA: Diagnosis not present

## 2018-03-27 DIAGNOSIS — D631 Anemia in chronic kidney disease: Secondary | ICD-10-CM | POA: Diagnosis not present

## 2018-03-27 DIAGNOSIS — N2581 Secondary hyperparathyroidism of renal origin: Secondary | ICD-10-CM | POA: Diagnosis not present

## 2018-03-27 DIAGNOSIS — D509 Iron deficiency anemia, unspecified: Secondary | ICD-10-CM | POA: Diagnosis not present

## 2018-03-30 DIAGNOSIS — D509 Iron deficiency anemia, unspecified: Secondary | ICD-10-CM | POA: Diagnosis not present

## 2018-03-30 DIAGNOSIS — Z992 Dependence on renal dialysis: Secondary | ICD-10-CM | POA: Diagnosis not present

## 2018-03-30 DIAGNOSIS — N186 End stage renal disease: Secondary | ICD-10-CM | POA: Diagnosis not present

## 2018-03-30 DIAGNOSIS — D631 Anemia in chronic kidney disease: Secondary | ICD-10-CM | POA: Diagnosis not present

## 2018-03-30 DIAGNOSIS — N2581 Secondary hyperparathyroidism of renal origin: Secondary | ICD-10-CM | POA: Diagnosis not present

## 2018-04-02 DIAGNOSIS — N2581 Secondary hyperparathyroidism of renal origin: Secondary | ICD-10-CM | POA: Diagnosis not present

## 2018-04-02 DIAGNOSIS — N186 End stage renal disease: Secondary | ICD-10-CM | POA: Diagnosis not present

## 2018-04-02 DIAGNOSIS — D509 Iron deficiency anemia, unspecified: Secondary | ICD-10-CM | POA: Diagnosis not present

## 2018-04-02 DIAGNOSIS — D631 Anemia in chronic kidney disease: Secondary | ICD-10-CM | POA: Diagnosis not present

## 2018-04-02 DIAGNOSIS — Z992 Dependence on renal dialysis: Secondary | ICD-10-CM | POA: Diagnosis not present

## 2018-04-03 DIAGNOSIS — N186 End stage renal disease: Secondary | ICD-10-CM | POA: Diagnosis not present

## 2018-04-03 DIAGNOSIS — Z992 Dependence on renal dialysis: Secondary | ICD-10-CM | POA: Diagnosis not present

## 2018-04-16 DIAGNOSIS — N186 End stage renal disease: Secondary | ICD-10-CM | POA: Diagnosis not present

## 2018-04-16 DIAGNOSIS — D509 Iron deficiency anemia, unspecified: Secondary | ICD-10-CM | POA: Diagnosis not present

## 2018-04-16 DIAGNOSIS — Z992 Dependence on renal dialysis: Secondary | ICD-10-CM | POA: Diagnosis not present

## 2018-04-16 DIAGNOSIS — Z131 Encounter for screening for diabetes mellitus: Secondary | ICD-10-CM | POA: Diagnosis not present

## 2018-04-24 ENCOUNTER — Other Ambulatory Visit: Payer: Self-pay | Admitting: Internal Medicine

## 2018-04-25 ENCOUNTER — Other Ambulatory Visit: Payer: Self-pay | Admitting: Internal Medicine

## 2018-05-12 IMAGING — CR DG CHEST 1V PORT
1 series · 1 of 1 positions shown · non-contrast
Comparison: 09/27/2010

CLINICAL DATA: Shortness of breath increasingly worse over the past
8 days. History of CHF, diabetes, hypertension, stroke, TIA, PVD.

EXAM:
PORTABLE CHEST 1 VIEW

[ap]
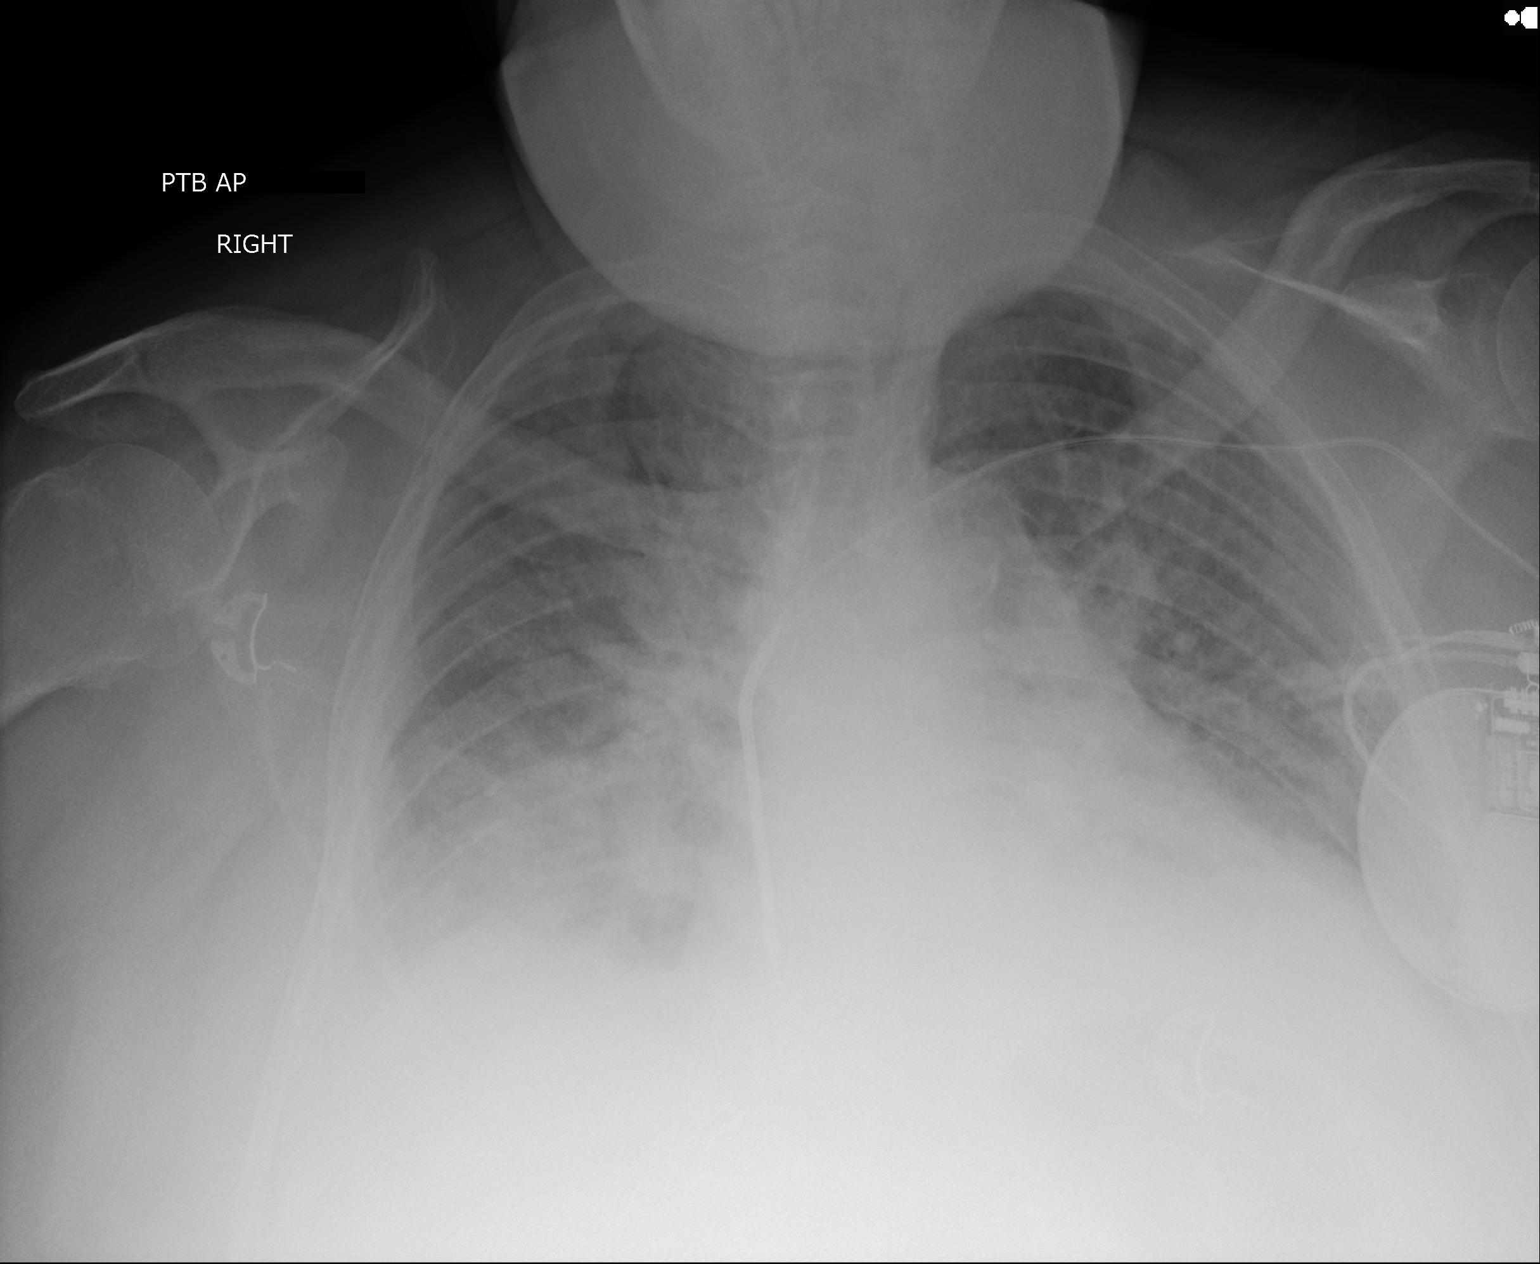

[1 of 1 positions shown; findings below may reference images not displayed]

FINDINGS: Cardiac pacemaker. Shallow inspiration. Cardiac enlargement with
diffuse pulmonary vascular congestion. Hazy perihilar opacities
likely representing early edema. Probable small right pleural
effusion. No focal volume loss or consolidation. No pneumothorax.
Calcification of the aorta.
IMPRESSION: Likely congestive heart failure with cardiac enlargement, pulmonary
vascular congestion, perihilar edema, and small right pleural
effusion.

## 2018-05-15 IMAGING — CR DG CHEST 1V
1 series · 1 of 1 positions shown · non-contrast
Comparison: 06/18/2016

CLINICAL DATA: Congestive heart failure

EXAM:
CHEST 1 VIEW

[ap]
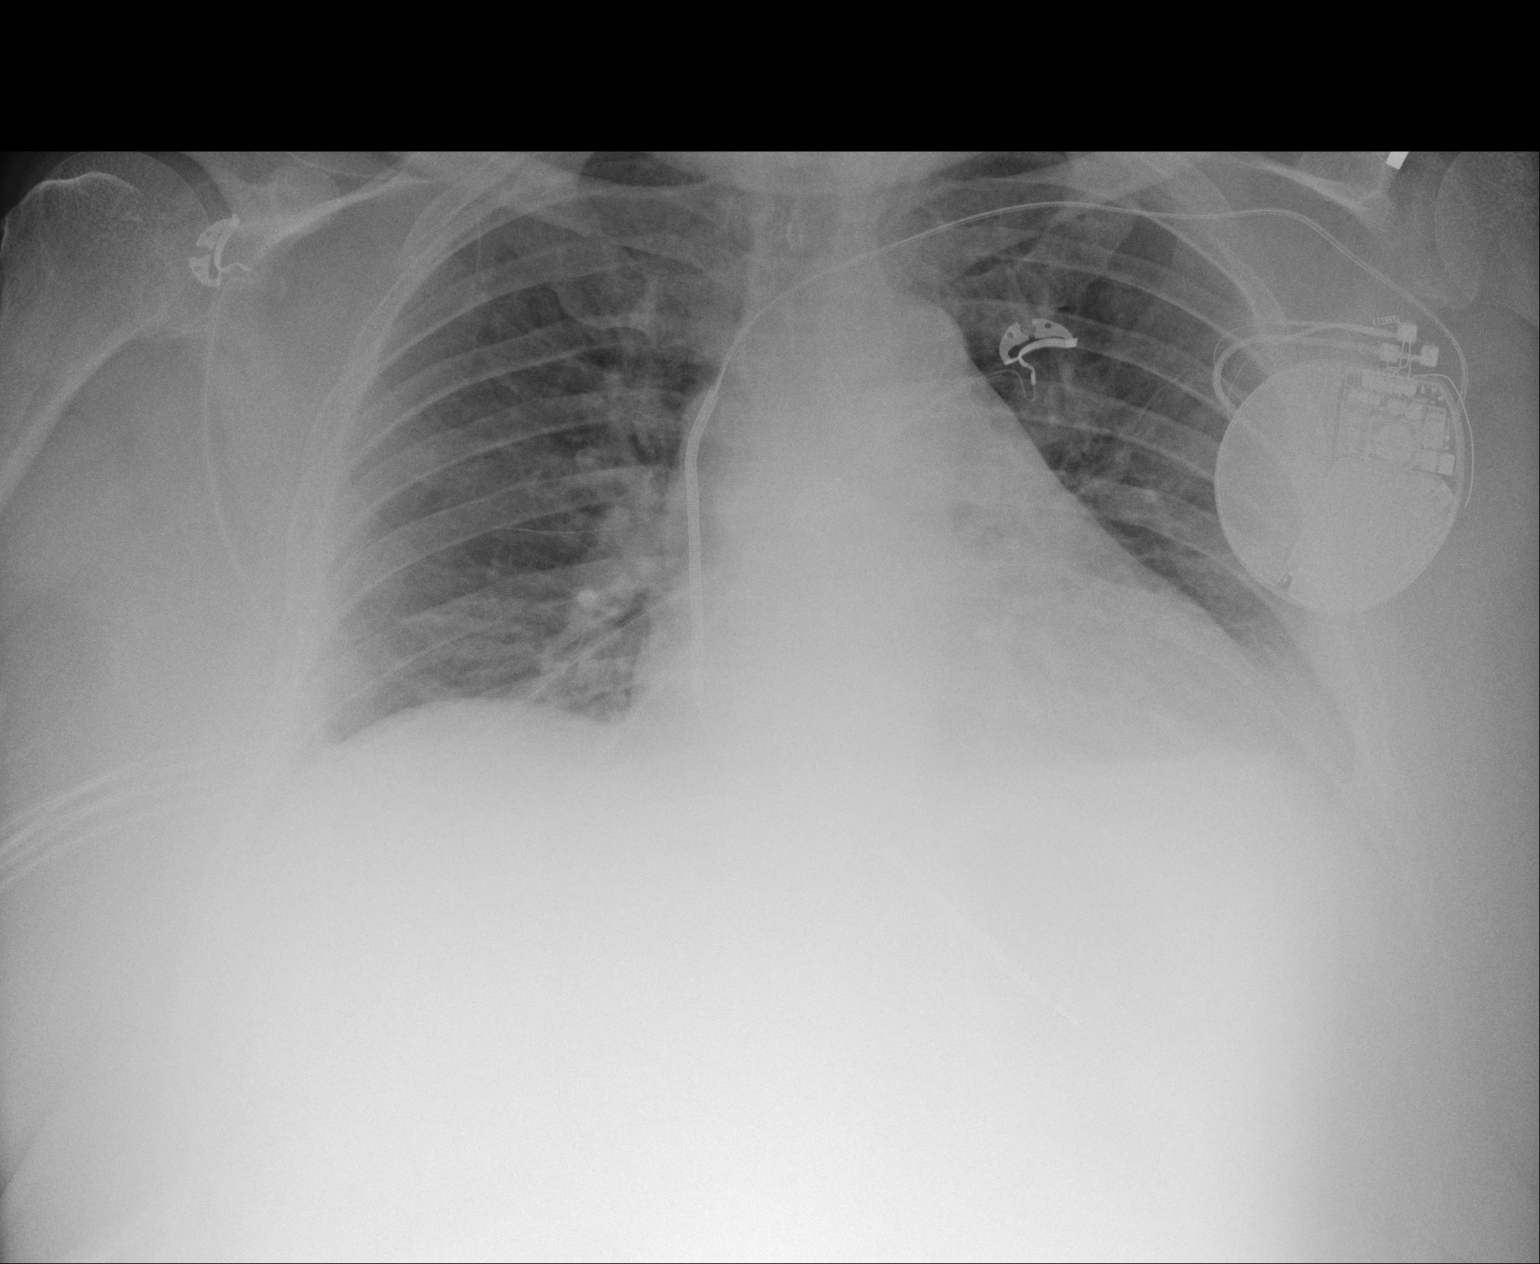

[1 of 1 positions shown; findings below may reference images not displayed]

FINDINGS: Cardiomegaly again noted. Single lead cardiac pacemaker is unchanged
in position. Improvement in aeration. Residual mild perihilar
interstitial prominence without convincing pulmonary edema. Mild
left basilar atelectasis. Stable trace bilateral pleural effusion.
IMPRESSION: Improvement in aeration. Residual mild perihilar interstitial
prominence without convincing pulmonary edema. Mild left basilar
atelectasis. Trace bilateral pleural effusion again noted

## 2018-06-28 ENCOUNTER — Encounter: Payer: Medicare Other | Admitting: Internal Medicine

## 2018-08-02 ENCOUNTER — Ambulatory Visit (INDEPENDENT_AMBULATORY_CARE_PROVIDER_SITE_OTHER): Payer: Medicare Other | Admitting: *Deleted

## 2018-08-02 ENCOUNTER — Other Ambulatory Visit: Payer: Self-pay

## 2018-08-02 DIAGNOSIS — I428 Other cardiomyopathies: Secondary | ICD-10-CM | POA: Diagnosis not present

## 2018-08-02 LAB — CUP PACEART REMOTE DEVICE CHECK
Battery Remaining Longevity: 66 mo
Battery Remaining Percentage: 70 %
Brady Statistic RV Percent Paced: 0 %
Date Time Interrogation Session: 20200430072200
HighPow Impedance: 48 Ohm
Implantable Lead Implant Date: 20050810
Implantable Lead Location: 753860
Implantable Lead Model: 158
Implantable Lead Serial Number: 153491
Implantable Pulse Generator Implant Date: 20110722
Lead Channel Impedance Value: 494 Ohm
Lead Channel Pacing Threshold Amplitude: 0.8 V
Lead Channel Pacing Threshold Pulse Width: 0.4 ms
Lead Channel Setting Pacing Amplitude: 2.5 V
Lead Channel Setting Pacing Pulse Width: 0.4 ms
Lead Channel Setting Sensing Sensitivity: 0.4 mV
Pulse Gen Serial Number: 269222

## 2018-08-10 ENCOUNTER — Encounter: Payer: Self-pay | Admitting: Cardiology

## 2018-08-10 NOTE — Progress Notes (Signed)
Remote ICD transmission.   

## 2018-09-18 ENCOUNTER — Telehealth: Payer: Self-pay | Admitting: Internal Medicine

## 2018-09-18 NOTE — Telephone Encounter (Signed)
Virtual Visit Pre-Appointment Phone Call  "(Name), I am calling you today to discuss your upcoming appointment. We are currently trying to limit exposure to the virus that causes COVID-19 by seeing patients at home rather than in the office."  1. "What is the BEST phone number to call the day of the visit?" - include this in appointment notes  2. Do you have or have access to (through a family member/friend) a smartphone with video capability that we can use for your visit?" a. If yes - list this number in appt notes as cell (if different from BEST phone #) and list the appointment type as a VIDEO visit in appointment notes b. If no - list the appointment type as a PHONE visit in appointment notes  3. Confirm consent - "In the setting of the current Covid19 crisis, you are scheduled for a (phone or video) visit with your provider on (date) at (time).  Just as we do with many in-office visits, in order for you to participate in this visit, we must obtain consent.  If you'd like, I can send this to your mychart (if signed up) or email for you to review.  Otherwise, I can obtain your verbal consent now.  All virtual visits are billed to your insurance company just like a normal visit would be.  By agreeing to a virtual visit, we'd like you to understand that the technology does not allow for your provider to perform an examination, and thus may limit your provider's ability to fully assess your condition. If your provider identifies any concerns that need to be evaluated in person, we will make arrangements to do so.  Finally, though the technology is pretty good, we cannot assure that it will always work on either your or our end, and in the setting of a video visit, we may have to convert it to a phone-only visit.  In either situation, we cannot ensure that we have a secure connection.  Are you willing to proceed?" STAFF: Did the patient verbally acknowledge consent to telehealth visit? Document  YES/NO here: Yes  4. Advise patient to be prepared - "Two hours prior to your appointment, go ahead and check your blood pressure, pulse, oxygen saturation, and your weight (if you have the equipment to check those) and write them all down. When your visit starts, your provider will ask you for this information. If you have an Apple Watch or Kardia device, please plan to have heart rate information ready on the day of your appointment. Please have a pen and paper handy nearby the day of the visit as well."  5. Give patient instructions for MyChart download to smartphone OR [email or web address] as below if video visit (depending on what platform provider is using)  6. Inform patient they will receive a phone call 15 minutes prior to their appointment time (may be from unknown caller ID) so they should be prepared to answer    TELEPHONE CALL NOTE  [name]. has been deemed a candidate for a follow-up tele-health visit to limit community exposure during the Covid-19 pandemic. I spoke with the patient via phone to ensure availability of phone/video source, confirm preferred email & phone number, and discuss instructions and expectations.  I reminded [name]. to be prepared with any vital sign and/or heart rhythm information that could potentially be obtained via home monitoring, at the time of his visit. I reminded [name]. to expect a phone  call prior to his visit.  [name] 09/18/2018 10:14 AM

## 2018-09-19 ENCOUNTER — Other Ambulatory Visit: Payer: Self-pay

## 2018-09-19 ENCOUNTER — Encounter: Payer: Medicare Other | Admitting: Internal Medicine

## 2018-09-19 ENCOUNTER — Telehealth (INDEPENDENT_AMBULATORY_CARE_PROVIDER_SITE_OTHER): Payer: Medicare Other | Admitting: Internal Medicine

## 2018-09-19 ENCOUNTER — Encounter: Payer: Self-pay | Admitting: Internal Medicine

## 2018-09-19 VITALS — BP 166/61 | Ht 72.0 in

## 2018-09-19 DIAGNOSIS — I10 Essential (primary) hypertension: Secondary | ICD-10-CM | POA: Diagnosis not present

## 2018-09-19 DIAGNOSIS — I428 Other cardiomyopathies: Secondary | ICD-10-CM

## 2018-09-19 DIAGNOSIS — I5042 Chronic combined systolic (congestive) and diastolic (congestive) heart failure: Secondary | ICD-10-CM | POA: Diagnosis not present

## 2018-09-19 NOTE — Patient Instructions (Signed)
Medication Instructions:  Your physician recommends that you continue on your current medications as directed. Please refer to the Current Medication list given to you today.  If you need a refill on your cardiac medications before your next appointment, please call your pharmacy.   Lab work: NONE   If you have labs (blood work) drawn today and your tests are completely normal, you will receive your results only by: Marland Kitchen MyChart Message (if you have MyChart) OR . A paper copy in the mail If you have any lab test that is abnormal or we need to change your treatment, we will call you to review the results.  Testing/Procedures: NONE   Follow-Up: At [facility], you and your health needs are our priority.  As part of our continuing mission to provide you with exceptional heart care, we have created designated Provider Care Teams.  These Care Teams include your primary Cardiologist (physician) and Advanced Practice Providers (APPs -  Physician Assistants and Nurse Practitioners) who all work together to provide you with the care you need, when you need it. You will need a follow up appointment in 6 months.  Please call our office 2 months in advance to schedule this appointment.  You may see Dr. Lovena Le or one of the following Advanced Practice Providers on your designated Care Team:   Chanetta Marshall, NP . Tommye Standard, PA-C Remote monitoring is used to monitor your Pacemaker of ICD from home. This monitoring reduces the number of office visits required to check your device to one time per year. It allows [place] to keep an eye on the functioning of your device to ensure it is working properly. You are scheduled for a device check from home on 11/22/18. You may send your transmission at any time that day. If you have a wireless device, the transmission will be sent automatically. After your physician reviews your transmission, you will receive a postcard with your next transmission date.    Any Other  Special Instructions Will Be Listed Below (If Applicable). Thank you for choosing [facility]!

## 2018-09-19 NOTE — Progress Notes (Signed)
Electrophysiology TeleHealth Note   Due to national recommendations of social distancing due to COVID 19, an audio/video telehealth visit is felt to be most appropriate for this patient at this time.  See MyChart message from today for the patient's consent to telehealth for [facility].   Date:  09/19/2018   ID:  Godfrey Pick., DOB [birth year], MRN [id number]  Location: patient's home  Provider location: [address], [place]  Evaluation Performed: Follow-up visit  PCP:  [name], MD  Cardiologist:  No primary care provider on file.  Electrophysiologist:  Dr Lovena Le  Chief Complaint:  "My blood pressure is dropping in dialysis."  History of Present Illness:    Theresa Dohrman. is a 63 y.o. male who presents via audio/video conferencing for a telehealth visit today.  He has a long h/o chronic systolic heart failure and a non-ischemic CM, s/p ICD insertion. He has HTN and ESRD on HD. He has severe peripheral vascular disease and is s/p bilateral BKA's. He has been bothered by low blood pressure during his HD sessions at times. He denies missing any meals.  The patient denies symptoms of fevers, chills, cough, or new SOB worrisome for COVID 19.  Past Medical History:  Diagnosis Date  . AICD (automatic cardioverter/defibrillator) present   . Anemia   . Arthritis   . Cerebrovascular disease   . CHF (congestive heart failure) ([place])   . Chronic kidney disease    ESRD, MWF HD  . Diabetes mellitus    type II  . History of TIAs   . Hyperlipidemia   . Hypertension   . Nonischemic cardiomyopathy ([place])   . PVD (peripheral vascular disease) ([place])    s/p B BKA  . Shortness of breath dyspnea    with exertion   . Skin disease    Rare  . Stroke [facility])    "light stroke"  . Tobacco abuse     Past Surgical History:  Procedure Laterality Date  . A/V FISTULAGRAM Left 06/24/2016   Procedure: A/V Fistulagram;  Surgeon: Elam Dutch, MD;  Location: [facility];  Service: Cardiovascular;  Laterality: Left;  . A/V FISTULAGRAM N/A 01/17/2017   Procedure: A/V Fistulagram - left arm;  Surgeon: Serafina Mitchell, MD;  Location: [facility] CV [facility];  Service: Cardiovascular;  Laterality: N/A;  . AV FISTULA PLACEMENT Left 04/08/2015   Procedure: Creation of Left arm BRACHIOCEPHALIC ARTERIOVENOUS  FISTULA ;  Surgeon: Mal Misty, MD;  Location: [facility];  Service: Vascular;  Laterality: Left;  . CARDIAC DEFIBRILLATOR PLACEMENT    . CARDIAC DEFIBRILLATOR PLACEMENT    . CATARACT EXTRACTION W/PHACO Right 03/17/2015   Procedure: CATARACT EXTRACTION PHACO AND INTRAOCULAR LENS PLACEMENT (IOC);  Surgeon: Rutherford Guys, MD;  [facility]: [facility];  Service: Ophthalmology;  Laterality: Right;  CDE: 6.59  . EYE SURGERY Right    Cataract  . LEG AMPUTATION BELOW KNEE     bilateral  . PERIPHERAL VASCULAR INTERVENTION  01/17/2017   Procedure: PERIPHERAL VASCULAR INTERVENTION;  Surgeon: Serafina Mitchell, MD;  Location: [facility] CV LAB;  Service: Cardiovascular;;  PTA  left arm fistula    Current Outpatient Medications  Medication Sig Dispense Refill  . acetaminophen (TYLENOL) 500 MG tablet Take 1,000 mg by mouth every 8 (eight) hours as needed for mild pain or headache.     Marland Kitchen amLODipine (NORVASC) 10 MG tablet Take 10 mg by mouth daily. for high blood pressure  1  . aspirin 81 MG tablet Take 81 mg by mouth daily.      . calcium acetate (PHOSLO) 667 MG capsule Take 667-1,334 mg by mouth See admin instructions. 2 caps three times daily with meals, and 1 capsule with snacks  0  . carvedilol (COREG) 25 MG tablet Take 1 tablet (25 mg total) by mouth 2 (two) times daily. 60 tablet 0  . cloNIDine (CATAPRES) 0.1 MG tablet Take 1 tablet (0.1 mg total) by mouth at bedtime. (Patient taking differently: Take 0.1 mg by mouth 3 (three) times daily. )    . clopidogrel (PLAVIX) 75 MG tablet Take 75 mg by mouth daily.      . fenofibrate 160 MG tablet Take 160 mg by mouth daily.  0  .  fluticasone (FLONASE) 50 MCG/ACT nasal spray Place 1 spray into both nostrils daily.    . folic acid (FOLVITE) 1 MG tablet Take 1 mg by mouth daily.    . furosemide (LASIX) 20 MG tablet Take 80 mg by mouth 2 (two) times daily. Take 3 tabs twice daily on NON DIALYSIS DAYS     . hydrALAZINE (APRESOLINE) 25 MG tablet Take 1 tablet (25 mg total) by mouth 3 (three) times daily. Please make overdue appt with Dr. Harrington Challenger before anymore refills. 1st attempt 90 tablet 0  . insulin lispro (HUMALOG KWIKPEN) 100 UNIT/ML KiwkPen Inject into the skin 3 (three) times daily. Give per sliding scale     . rosuvastatin (CRESTOR) 20 MG tablet Take 20 mg by mouth daily.  0  . TRESIBA FLEXTOUCH 100 UNIT/ML SOPN FlexTouch Pen INJECT 46 UNITS SUBCUTANEOUSLY INTO SKIN ONCE D FOR DIABETES     No current facility-administered medications for this visit.     Allergies:   Acetazolamide, Contrast media [iodinated diagnostic agents], and Other   Social History:  The patient  reports that he quit smoking about 20 years ago. He has a 20.00 pack-year smoking history. He has never used smokeless tobacco. He reports current alcohol use. He reports that he does not use drugs.   Family History:  The patient's family history includes Coronary artery disease in an other family member; Diabetes in his brother, brother, brother, brother, father, mother, and another family member; Heart failure in his mother; Stroke in his father.   ROS:  Please see the history of present illness.   All other systems are personally reviewed and negative.    Exam:    Vital Signs:  BP (!) 166/61   Ht 6' (1.829 m)   BMI 33.23 kg/m    Labs/Other Tests and Data Reviewed:    Recent Labs: No results found for requested labs within last 8760 hours.   Wt Readings from Last 3 Encounters:  06/22/17 245 lb (111.1 kg)  03/30/17 235 lb (106.6 kg)  01/17/17 230 lb (104.3 kg)     Other studies personally reviewed:   Last device remote is reviewed from  [place] PDF dated 07/23/18 which reveals normal device function, no arrhythmias    ASSESSMENT & PLAN:    1.  Chronic systolic heart failure - his symptoms are class 2 except in HD when he suffers from low output. I have encouraged him to hydrate prior to HD. If he continues to have low bp then he will need to reduce his dose of coreg the night before HD. 2. ICD - his [employer] ICD is working normally. We will recheck in several months. 3. HTN - his blood pressure is  labile and related to loading conditions. He ultimately have to run a little high, especially prior to HD. 4. COVID 19 screen The patient denies symptoms of COVID 19 at this time.  The importance of social distancing was discussed today.  Follow-up:  6 months Next remote: 8/20  Current medicines are reviewed at length with the patient today.   The patient does not have concerns regarding his medicines.  The following changes were made today:  none  Labs/ tests ordered today include: none No orders of the defined types were placed in this encounter.    Patient Risk:  after full review of this patients clinical status, I feel that they are at moderate risk at this time.  Today, I have spent 15 minutes with the patient with telehealth technology discussing all of the above.    Signed, Cristopher Peru, MD  09/19/2018 3:42 PM     [facility] [address] [place] [address] [phone] (office) [phone] (fax)

## 2018-11-01 ENCOUNTER — Ambulatory Visit (INDEPENDENT_AMBULATORY_CARE_PROVIDER_SITE_OTHER): Payer: Medicare Other | Admitting: *Deleted

## 2018-11-01 DIAGNOSIS — I428 Other cardiomyopathies: Secondary | ICD-10-CM | POA: Diagnosis not present

## 2018-11-02 LAB — CUP PACEART REMOTE DEVICE CHECK
Battery Remaining Longevity: 60 mo
Battery Remaining Percentage: 66 %
Brady Statistic RV Percent Paced: 0 %
Date Time Interrogation Session: 20200730071100
HighPow Impedance: 49 Ohm
Implantable Lead Implant Date: 20050810
Implantable Lead Location: 753860
Implantable Lead Model: 158
Implantable Lead Serial Number: 153491
Implantable Pulse Generator Implant Date: 20110722
Lead Channel Impedance Value: 492 Ohm
Lead Channel Pacing Threshold Amplitude: 0.8 V
Lead Channel Pacing Threshold Pulse Width: 0.4 ms
Lead Channel Setting Pacing Amplitude: 2.5 V
Lead Channel Setting Pacing Pulse Width: 0.4 ms
Lead Channel Setting Sensing Sensitivity: 0.4 mV
Pulse Gen Serial Number: 269222

## 2018-11-09 ENCOUNTER — Encounter: Payer: Self-pay | Admitting: Cardiology

## 2018-11-09 NOTE — Progress Notes (Signed)
Remote ICD transmission.   

## 2019-01-31 ENCOUNTER — Ambulatory Visit (INDEPENDENT_AMBULATORY_CARE_PROVIDER_SITE_OTHER): Payer: Medicare Other | Admitting: *Deleted

## 2019-01-31 DIAGNOSIS — I5042 Chronic combined systolic (congestive) and diastolic (congestive) heart failure: Secondary | ICD-10-CM | POA: Diagnosis not present

## 2019-01-31 LAB — CUP PACEART REMOTE DEVICE CHECK
Battery Remaining Longevity: 60 mo
Battery Remaining Percentage: 64 %
Brady Statistic RV Percent Paced: 0 %
Date Time Interrogation Session: 20201029072200
HighPow Impedance: 50 Ohm
Implantable Lead Implant Date: 20050810
Implantable Lead Location: 753860
Implantable Lead Model: 158
Implantable Lead Serial Number: 153491
Implantable Pulse Generator Implant Date: 20110722
Lead Channel Impedance Value: 513 Ohm
Lead Channel Pacing Threshold Amplitude: 0.8 V
Lead Channel Pacing Threshold Pulse Width: 0.4 ms
Lead Channel Setting Pacing Amplitude: 2.5 V
Lead Channel Setting Pacing Pulse Width: 0.4 ms
Lead Channel Setting Sensing Sensitivity: 0.4 mV
Pulse Gen Serial Number: 269222

## 2019-02-23 NOTE — Progress Notes (Signed)
Remote ICD transmission.   

## 2019-05-02 ENCOUNTER — Ambulatory Visit (INDEPENDENT_AMBULATORY_CARE_PROVIDER_SITE_OTHER): Payer: Medicare Other | Admitting: *Deleted

## 2019-05-02 DIAGNOSIS — I5042 Chronic combined systolic (congestive) and diastolic (congestive) heart failure: Secondary | ICD-10-CM | POA: Diagnosis not present

## 2019-05-02 LAB — CUP PACEART REMOTE DEVICE CHECK
Battery Remaining Longevity: 54 mo
Battery Remaining Percentage: 61 %
Brady Statistic RV Percent Paced: 0 %
Date Time Interrogation Session: 20210128031100
HighPow Impedance: 52 Ohm
Implantable Lead Implant Date: 20050810
Implantable Lead Location: 753860
Implantable Lead Model: 158
Implantable Lead Serial Number: 153491
Implantable Pulse Generator Implant Date: 20110722
Lead Channel Impedance Value: 523 Ohm
Lead Channel Pacing Threshold Amplitude: 0.8 V
Lead Channel Pacing Threshold Pulse Width: 0.4 ms
Lead Channel Setting Pacing Amplitude: 2.5 V
Lead Channel Setting Pacing Pulse Width: 0.4 ms
Lead Channel Setting Sensing Sensitivity: 0.4 mV
Pulse Gen Serial Number: 269222

## 2019-05-02 NOTE — Progress Notes (Signed)
ICD Remote  

## 2019-06-26 ENCOUNTER — Telehealth: Payer: Self-pay | Admitting: Internal Medicine

## 2019-06-26 NOTE — Telephone Encounter (Signed)
Charles left message on voice mail asking for someone in clinical to call him back in reference to patient

## 2019-06-27 NOTE — Telephone Encounter (Signed)
Appt made for pt on April 6 with Dr. Lovena Le.

## 2019-07-09 ENCOUNTER — Encounter: Payer: Self-pay | Admitting: Internal Medicine

## 2019-07-09 ENCOUNTER — Ambulatory Visit (INDEPENDENT_AMBULATORY_CARE_PROVIDER_SITE_OTHER): Payer: Medicare Other | Admitting: Internal Medicine

## 2019-07-09 ENCOUNTER — Other Ambulatory Visit: Payer: Self-pay

## 2019-07-09 VITALS — BP 152/80 | HR 76 | Temp 97.6°F | Ht 72.0 in

## 2019-07-09 DIAGNOSIS — I5022 Chronic systolic (congestive) heart failure: Secondary | ICD-10-CM | POA: Diagnosis not present

## 2019-07-09 LAB — CUP PACEART INCLINIC DEVICE CHECK
Brady Statistic AS VP Percent: <1
Date Time Interrogation Session: 20210406143617
Implantable Lead Implant Date: 20050810
Implantable Lead Location: 753860
Implantable Lead Model: 158
Implantable Lead Serial Number: 153491
Implantable Pulse Generator Implant Date: 20110722
Lead Channel Pacing Threshold Amplitude: 0.7 V
Lead Channel Pacing Threshold Pulse Width: 0.4 ms
Lead Channel Sensing Intrinsic Amplitude: 21.6 mV
Pulse Gen Serial Number: 269222

## 2019-07-09 MED ORDER — CARVEDILOL 12.5 MG PO TABS: ORAL_TABLET | ORAL | 3 refills | Status: DC

## 2019-07-09 NOTE — Patient Instructions (Signed)
Medication Instructions:  DECREASE Coreg to 12.5 mg twice a day   DO NOT TAKE AM dose on days you have dialysis  *If you need a refill on your cardiac medications before your next appointment, please call your pharmacy*   Lab Work: none If you have labs (blood work) drawn today and your tests are completely normal, you will receive your results only by: Marland Kitchen MyChart Message (if you have MyChart) OR . A paper copy in the mail If you have any lab test that is abnormal or we need to change your treatment, we will call you to review the results.   Testing/Procedures: none   Follow-Up: At [facility], you and your health needs are our priority.  As part of our continuing mission to provide you with exceptional heart care, we have created designated Provider Care Teams.  These Care Teams include your primary Cardiologist (physician) and Advanced Practice Providers (APPs -  Physician Assistants and Nurse Practitioners) who all work together to provide you with the care you need, when you need it.  We recommend signing up for the patient portal called "MyChart".  Sign up information is provided on this After Visit Summary.  MyChart is used to connect with patients for Virtual Visits (Telemedicine).  Patients are able to view lab/test results, encounter notes, upcoming appointments, etc.  Non-urgent messages can be sent to your provider as well.   To learn more about what you can do with MyChart, go to [email or web address].    Your next appointment:   12 month(s)  The format for your next appointment:   In Person  Provider:   Cristopher Peru, MD   Other Instructions None      Thank you for choosing [facility] !

## 2019-07-09 NOTE — Progress Notes (Signed)
HPI [name] returns today for followup of his ICD. He is a pleasant 64 yo man with ESRD on HD, chornic systolic heart failure due to a non-ischemic CM, peripheral vascular disease s/p bilateral BKA's. His main complaint today is that he get hypotension during the last half of HD. He does not take his bp lowering meds on the day of HD. Allergies  Allergen Reactions  . Acetazolamide Other (See Comments)    Jittery odd feeling. (hyper feeling)  . Contrast Media [Iodinated Diagnostic Agents] Hives    In the 80's   . No Known Allergies   . Other Other (See Comments)    Transfer Dye---"Makes me tired"     Current Outpatient Medications  Medication Sig Dispense Refill  . acetaminophen (TYLENOL) 500 MG tablet Take 1,000 mg by mouth every 8 (eight) hours as needed for mild pain or headache.     Marland Kitchen amLODipine (NORVASC) 10 MG tablet Take 10 mg by mouth daily. for high blood pressure  1  . aspirin 81 MG tablet Take 81 mg by mouth daily.      . calcium acetate (PHOSLO) 667 MG capsule Take 667-1,334 mg by mouth See admin instructions. 2 caps three times daily with meals, and 1 capsule with snacks  0  . carvedilol (COREG) 12.5 MG tablet Do NOT take am dose on your dialysis days 180 tablet 3  . cloNIDine (CATAPRES) 0.1 MG tablet Take 1 tablet (0.1 mg total) by mouth at bedtime. (Patient taking differently: Take 0.1 mg by mouth 3 (three) times daily. )    . clopidogrel (PLAVIX) 75 MG tablet Take 75 mg by mouth daily.      . fenofibrate 160 MG tablet Take 160 mg by mouth daily.  0  . fluticasone (FLONASE) 50 MCG/ACT nasal spray Place 1 spray into both nostrils daily.    . folic acid (FOLVITE) 1 MG tablet Take 1 mg by mouth daily.    . furosemide (LASIX) 20 MG tablet Take 80 mg by mouth 2 (two) times daily. Take 3 tabs twice daily on NON DIALYSIS DAYS     . hydrALAZINE (APRESOLINE) 25 MG tablet Take 1 tablet (25 mg total) by mouth 3 (three) times daily. Please make overdue appt with Dr. Harrington Challenger  before anymore refills. 1st attempt 90 tablet 0  . insulin lispro (HUMALOG KWIKPEN) 100 UNIT/ML KiwkPen Inject into the skin 3 (three) times daily. Give per sliding scale     . rosuvastatin (CRESTOR) 20 MG tablet Take 20 mg by mouth daily.  0  . TRESIBA FLEXTOUCH 100 UNIT/ML SOPN FlexTouch Pen INJECT 46 UNITS SUBCUTANEOUSLY INTO SKIN ONCE D FOR DIABETES     No current facility-administered medications for this visit.     Past Medical History:  Diagnosis Date  . AICD (automatic cardioverter/defibrillator) present   . Anemia   . Arthritis   . Cerebrovascular disease   . CHF (congestive heart failure) ([place])   . Chronic kidney disease    ESRD, MWF HD  . Diabetes mellitus    type II  . History of TIAs   . Hyperlipidemia   . Hypertension   . Nonischemic cardiomyopathy ([place])   . PVD (peripheral vascular disease) ([place])    s/p B BKA  . Shortness of breath dyspnea    with exertion   . Skin disease    Rare  . Stroke [facility])    "light stroke"  . Tobacco abuse     ROS:  All systems reviewed and negative except as noted in the HPI.   Past Surgical History:  Procedure Laterality Date  . A/V FISTULAGRAM Left 06/24/2016   Procedure: A/V Fistulagram;  Surgeon: Elam Dutch, MD;  Location: [facility] CV LAB;  Service: Cardiovascular;  Laterality: Left;  . A/V FISTULAGRAM N/A 01/17/2017   Procedure: A/V Fistulagram - left arm;  Surgeon: Serafina Mitchell, MD;  Location: [facility] CV [facility];  Service: Cardiovascular;  Laterality: N/A;  . AV FISTULA PLACEMENT Left 04/08/2015   Procedure: Creation of Left arm BRACHIOCEPHALIC ARTERIOVENOUS  FISTULA ;  Surgeon: Mal Misty, MD;  Location: [facility];  Service: Vascular;  Laterality: Left;  . CARDIAC DEFIBRILLATOR PLACEMENT    . CARDIAC DEFIBRILLATOR PLACEMENT    . CATARACT EXTRACTION W/PHACO Right 03/17/2015   Procedure: CATARACT EXTRACTION PHACO AND INTRAOCULAR LENS PLACEMENT (IOC);  Surgeon: Rutherford Guys, MD;  Location: [facility];  Service:  Ophthalmology;  Laterality: Right;  CDE: 6.59  . EYE SURGERY Right    Cataract  . LEG AMPUTATION BELOW KNEE     bilateral  . PERIPHERAL VASCULAR INTERVENTION  01/17/2017   Procedure: PERIPHERAL VASCULAR INTERVENTION;  Surgeon: Serafina Mitchell, MD;  Location: [place] CV LAB;  Service: Cardiovascular;;  PTA  left arm fistula     Family History  Problem Relation Age of Onset  . Diabetes Father   . Stroke Father   . Diabetes Brother   . Diabetes Brother   . Diabetes Brother   . Diabetes Brother   . Heart failure Mother   . Diabetes Mother   . Diabetes Other   . Coronary artery disease Other      Social History   Socioeconomic History  . Marital status: Legally Separated    Spouse name: Not on file  . Number of children: Not on file  . Years of education: Not on file  . Highest education level: Not on file  Occupational History  . Occupation: Disabled    Employer: [employer]  Tobacco Use  . Smoking status: Former Smoker    Packs/day: 1.00    Years: 20.00    Pack years: 20.00    Quit date: 03/12/1998    Years since quitting: 21.3  . Smokeless tobacco: Never Used  Substance and Sexual Activity  . Alcohol use: Yes    Alcohol/week: 0.0 standard drinks    Comment: occasional  . Drug use: No  . Sexual activity: Not on file  Other Topics Concern  . Not on file  Social History Narrative   Divorced   No regular exercise   Occasional caffeine use   Social Determinants of Health   Financial Resource Strain:   . Difficulty of Paying Living Expenses:   Food Insecurity:   . Worried About Charity fundraiser in the Last Year:   . Arboriculturist in the Last Year:   Transportation Needs:   . Film/video editor (Medical):   Marland Kitchen Lack of Transportation (Non-Medical):   Physical Activity:   . Days of Exercise per Week:   . Minutes of Exercise per Session:   Stress:   . Feeling of Stress :   Social Connections:   . Frequency of Communication with Friends  and Family:   . Frequency of Social Gatherings with Friends and Family:   . Attends Religious Services:   . Active Member of Clubs or Organizations:   . Attends Archivist Meetings:   Marland Kitchen Marital Status:  Intimate Partner Violence:   . Fear of Current or Ex-Partner:   . Emotionally Abused:   Marland Kitchen Physically Abused:   . Sexually Abused:      BP (!) 152/80   Pulse 76   Temp 97.6 F (36.4 C)   Ht 6' (1.829 m)   SpO2 95%   BMI 33.23 kg/m   Physical Exam:  Well appearing NAD HEENT: Unremarkable Neck:  No JVD, no thyromegally Lymphatics:  No adenopathy Back:  No CVA tenderness Lungs:  Clear HEART:  Regular rate rhythm, no murmurs, no rubs, no clicks Abd:  soft, positive bowel sounds, no organomegally, no rebound, no guarding Ext:  Bilateral BKA's. Skin:  No rashes no nodules Neuro:  CN II through XII intact, motor grossly intact  DEVICE  Normal device function.  See PaceArt for details.   Assess/Plan: 1. Chronic systolic heart failure - his symptoms are class 2. He is limited by his amputations.  2. ICD - his [employer] VVI ICD is working normally. It has been in place for almost 10 years and has another 4 years of longevity. 3. HTN -his bp runs a little high but is dropping during HD.  I have recommended he reduce his coreg to 12.5 bid. And take none on HD days. 4. Peripheral vascular disease - he is s/p amputations. No symptoms.  Carleene Overlie Dottie Vaquerano,M.D.,

## 2019-08-01 ENCOUNTER — Ambulatory Visit (INDEPENDENT_AMBULATORY_CARE_PROVIDER_SITE_OTHER): Payer: Medicare Other | Admitting: *Deleted

## 2019-08-01 DIAGNOSIS — I5042 Chronic combined systolic (congestive) and diastolic (congestive) heart failure: Secondary | ICD-10-CM | POA: Diagnosis not present

## 2019-08-01 LAB — CUP PACEART REMOTE DEVICE CHECK
Battery Remaining Longevity: 54 mo
Battery Remaining Percentage: 57 %
Brady Statistic RV Percent Paced: 0 %
Date Time Interrogation Session: 20210429031000
HighPow Impedance: 50 Ohm
Implantable Lead Implant Date: 20050810
Implantable Lead Location: 753860
Implantable Lead Model: 158
Implantable Lead Serial Number: 153491
Implantable Pulse Generator Implant Date: 20110722
Lead Channel Impedance Value: 496 Ohm
Lead Channel Pacing Threshold Amplitude: 0.7 V
Lead Channel Pacing Threshold Pulse Width: 0.4 ms
Lead Channel Setting Pacing Amplitude: 2.5 V
Lead Channel Setting Pacing Pulse Width: 0.4 ms
Lead Channel Setting Sensing Sensitivity: 0.4 mV
Pulse Gen Serial Number: 269222

## 2019-08-02 NOTE — Progress Notes (Signed)
ICD Remote  

## 2019-10-31 ENCOUNTER — Ambulatory Visit (INDEPENDENT_AMBULATORY_CARE_PROVIDER_SITE_OTHER): Payer: Medicare Other | Admitting: *Deleted

## 2019-10-31 DIAGNOSIS — I428 Other cardiomyopathies: Secondary | ICD-10-CM | POA: Diagnosis not present

## 2019-10-31 LAB — CUP PACEART REMOTE DEVICE CHECK
Battery Remaining Longevity: 54 mo
Battery Remaining Percentage: 54 %
Brady Statistic RV Percent Paced: 0 %
Date Time Interrogation Session: 20210729031100
HighPow Impedance: 51 Ohm
Implantable Lead Implant Date: 20050810
Implantable Lead Location: 753860
Implantable Lead Model: 158
Implantable Lead Serial Number: 153491
Implantable Pulse Generator Implant Date: 20110722
Lead Channel Impedance Value: 512 Ohm
Lead Channel Pacing Threshold Amplitude: 0.7 V
Lead Channel Pacing Threshold Pulse Width: 0.4 ms
Lead Channel Setting Pacing Amplitude: 2.5 V
Lead Channel Setting Pacing Pulse Width: 0.4 ms
Lead Channel Setting Sensing Sensitivity: 0.4 mV
Pulse Gen Serial Number: 269222

## 2019-11-04 NOTE — Progress Notes (Signed)
Remote ICD transmission.   

## 2020-01-30 ENCOUNTER — Ambulatory Visit (INDEPENDENT_AMBULATORY_CARE_PROVIDER_SITE_OTHER): Payer: Medicare Other

## 2020-01-30 DIAGNOSIS — I428 Other cardiomyopathies: Secondary | ICD-10-CM | POA: Diagnosis not present

## 2020-01-30 LAB — CUP PACEART REMOTE DEVICE CHECK
Battery Remaining Longevity: 48 mo
Battery Remaining Percentage: 51 %
Brady Statistic RV Percent Paced: 0 %
Date Time Interrogation Session: 20211028031200
HighPow Impedance: 54 Ohm
Implantable Lead Implant Date: 20050810
Implantable Lead Location: 753860
Implantable Lead Model: 158
Implantable Lead Serial Number: 153491
Implantable Pulse Generator Implant Date: 20110722
Lead Channel Impedance Value: 544 Ohm
Lead Channel Pacing Threshold Amplitude: 0.7 V
Lead Channel Pacing Threshold Pulse Width: 0.4 ms
Lead Channel Setting Pacing Amplitude: 2.5 V
Lead Channel Setting Pacing Pulse Width: 0.4 ms
Lead Channel Setting Sensing Sensitivity: 0.4 mV
Pulse Gen Serial Number: 269222

## 2020-02-04 NOTE — Progress Notes (Signed)
Remote ICD transmission.   

## 2020-04-04 DIAGNOSIS — U071 COVID-19: Secondary | ICD-10-CM

## 2020-04-04 HISTORY — DX: COVID-19: U07.1

## 2020-04-30 ENCOUNTER — Ambulatory Visit (INDEPENDENT_AMBULATORY_CARE_PROVIDER_SITE_OTHER): Payer: Medicare Other

## 2020-04-30 DIAGNOSIS — I428 Other cardiomyopathies: Secondary | ICD-10-CM | POA: Diagnosis not present

## 2020-04-30 LAB — CUP PACEART REMOTE DEVICE CHECK
Battery Remaining Longevity: 42 mo
Battery Remaining Percentage: 47 %
Brady Statistic RV Percent Paced: 0 %
Date Time Interrogation Session: 20220127031100
HighPow Impedance: 52 Ohm
Implantable Lead Implant Date: 20050810
Implantable Lead Location: 753860
Implantable Lead Model: 158
Implantable Lead Serial Number: 153491
Implantable Pulse Generator Implant Date: 20110722
Lead Channel Impedance Value: 543 Ohm
Lead Channel Pacing Threshold Amplitude: 0.7 V
Lead Channel Pacing Threshold Pulse Width: 0.4 ms
Lead Channel Setting Pacing Amplitude: 2.5 V
Lead Channel Setting Pacing Pulse Width: 0.4 ms
Lead Channel Setting Sensing Sensitivity: 0.4 mV
Pulse Gen Serial Number: 269222

## 2020-05-11 NOTE — Progress Notes (Signed)
Remote ICD transmission.   

## 2020-07-30 ENCOUNTER — Ambulatory Visit (INDEPENDENT_AMBULATORY_CARE_PROVIDER_SITE_OTHER): Payer: Medicare Other

## 2020-07-30 DIAGNOSIS — I428 Other cardiomyopathies: Secondary | ICD-10-CM

## 2020-07-30 LAB — CUP PACEART REMOTE DEVICE CHECK
Battery Remaining Longevity: 42 mo
Battery Remaining Percentage: 43 %
Brady Statistic RV Percent Paced: 0 %
Date Time Interrogation Session: 20220428031100
HighPow Impedance: 51 Ohm
Implantable Lead Implant Date: 20050810
Implantable Lead Location: 753860
Implantable Lead Model: 158
Implantable Lead Serial Number: 153491
Implantable Pulse Generator Implant Date: 20110722
Lead Channel Impedance Value: 521 Ohm
Lead Channel Pacing Threshold Amplitude: 0.7 V
Lead Channel Pacing Threshold Pulse Width: 0.4 ms
Lead Channel Setting Pacing Amplitude: 2.5 V
Lead Channel Setting Pacing Pulse Width: 0.4 ms
Lead Channel Setting Sensing Sensitivity: 0.4 mV
Pulse Gen Serial Number: 269222

## 2020-08-20 NOTE — Progress Notes (Signed)
Remote ICD transmission.   

## 2020-09-29 ENCOUNTER — Other Ambulatory Visit: Payer: Self-pay | Admitting: Internal Medicine

## 2020-10-02 ENCOUNTER — Telehealth: Payer: Self-pay

## 2020-10-02 ENCOUNTER — Other Ambulatory Visit: Payer: Self-pay

## 2020-10-02 DIAGNOSIS — N186 End stage renal disease: Secondary | ICD-10-CM

## 2020-10-02 NOTE — Telephone Encounter (Signed)
Ramiro Harvest PA from HD calls today to report patient's access clotted Monday. Access was placed in the LUE by Dr. [name] in 2017 - fistulogram in 2018. We have not seen the patient since this time. HD sent patient to [facility] who placed a TDC and would like him to be evaluated for new access. Placed on MD schedule for evaluation and vein mapping.

## 2020-10-06 ENCOUNTER — Encounter: Payer: Self-pay | Admitting: Vascular Surgery

## 2020-10-06 ENCOUNTER — Other Ambulatory Visit: Payer: Self-pay

## 2020-10-06 ENCOUNTER — Other Ambulatory Visit: Payer: Self-pay | Admitting: Vascular Surgery

## 2020-10-06 ENCOUNTER — Ambulatory Visit (HOSPITAL_COMMUNITY)
Admission: RE | Admit: 2020-10-06 | Discharge: 2020-10-06 | Disposition: A | Discharge status: Home / Self Care | Payer: Medicare Other | Source: Ambulatory Visit | Attending: Vascular Surgery | Admitting: Vascular Surgery

## 2020-10-06 ENCOUNTER — Ambulatory Visit (INDEPENDENT_AMBULATORY_CARE_PROVIDER_SITE_OTHER): Payer: Medicare Other | Admitting: Vascular Surgery

## 2020-10-06 ENCOUNTER — Ambulatory Visit (INDEPENDENT_AMBULATORY_CARE_PROVIDER_SITE_OTHER)
Admission: RE | Admit: 2020-10-06 | Discharge: 2020-10-06 | Disposition: A | Discharge status: Home / Self Care | Payer: Medicare Other | Source: Ambulatory Visit | Attending: Vascular Surgery | Admitting: Vascular Surgery

## 2020-10-06 VITALS — BP 173/84 | HR 78 | Temp 97.3°F | Resp 18 | Wt 250.0 lb

## 2020-10-06 DIAGNOSIS — N186 End stage renal disease: Secondary | ICD-10-CM | POA: Diagnosis not present

## 2020-10-06 NOTE — H&P (View-Only) (Signed)
Patient name: [name]. MRN: [id number] DOB: 1955-08-03 Sex: male  REASON FOR CONSULT: Evaluate for permanent hemodialysis access  HPI: [name]. is a 65 y.o. male, with multiple medical comorbidities listed below including end-stage renal disease on dialysis Monday Wednesday Friday that presents for evaluation of new dialysis access.  Patient previously had a left arm brachiocephalic AV fistula that was placed in 2017.  Has had multiple additional interventions to maintain patency with fistulograms in left arm for peripheral stenosis.  He states this thrombosed about 3 weeks ago and is no longer working.  He has a right IJ catheter that he is using for dialysis at this time.  States he is right-handed.  He has bilateral BKA's and is in a wheelchair today.  Past Medical History:  Diagnosis Date   AICD (automatic cardioverter/defibrillator) present    Anemia    Arthritis    Cerebrovascular disease    CHF (congestive heart failure) (HCC)    Chronic kidney disease    ESRD, MWF HD   Diabetes mellitus    type II   History of TIAs    Hyperlipidemia    Hypertension    Nonischemic cardiomyopathy (HCC)    PVD (peripheral vascular disease) (HCC)    s/p B BKA   Shortness of breath dyspnea    with exertion    Skin disease    Rare   Stroke ([place])    "light stroke"   Tobacco abuse     Past Surgical History:  Procedure Laterality Date   A/V FISTULAGRAM Left 06/24/2016   Procedure: A/V Fistulagram;  Surgeon: Elam Dutch, MD;  Location: [facility];  Service: Cardiovascular;  Laterality: Left;   A/V FISTULAGRAM N/A 01/17/2017   Procedure: A/V Fistulagram - left arm;  Surgeon: Serafina Mitchell, MD;  Location: [facility];  Service: Cardiovascular;  Laterality: N/A;   AV FISTULA PLACEMENT Left 04/08/2015   Procedure: Creation of Left arm BRACHIOCEPHALIC ARTERIOVENOUS  FISTULA ;  Surgeon: Mal Misty, MD;  Location: [facility];  Service: Vascular;  Laterality:  Left;   CARDIAC DEFIBRILLATOR PLACEMENT     CARDIAC DEFIBRILLATOR PLACEMENT     CATARACT EXTRACTION W/PHACO Right 03/17/2015   Procedure: CATARACT EXTRACTION PHACO AND INTRAOCULAR LENS PLACEMENT ([place]);  Surgeon: Rutherford Guys, MD;  Location: [facility];  Service: Ophthalmology;  Laterality: Right;  CDE: 6.59   EYE SURGERY Right    Cataract   LEG AMPUTATION BELOW KNEE     bilateral   PERIPHERAL VASCULAR INTERVENTION  01/17/2017   Procedure: PERIPHERAL VASCULAR INTERVENTION;  Surgeon: Serafina Mitchell, MD;  Location: [facility];  Service: Cardiovascular;;  PTA  left arm fistula    Family History  Problem Relation Age of Onset   Diabetes Father    Stroke Father    Diabetes Brother    Diabetes Brother    Diabetes Brother    Diabetes Brother    Heart failure Mother    Diabetes Mother    Diabetes Other    Coronary artery disease Other     SOCIAL HISTORY: Social History   Socioeconomic History   Marital status: Legally Separated    Spouse name: Not on file   Number of children: Not on file   Years of education: Not on file   Highest education level: Not on file  Occupational History   Occupation: Disabled    Employer: [employer]  Tobacco Use   Smoking  status: Former    Packs/day: 1.00    Years: 20.00    Pack years: 20.00    Types: Cigarettes    Quit date: 03/12/1998    Years since quitting: 22.5   Smokeless tobacco: Never  Vaping Use   Vaping Use: Never used  Substance and Sexual Activity   Alcohol use: Yes    Alcohol/week: 0.0 standard drinks    Comment: occasional   Drug use: No   Sexual activity: Not on file  Other Topics Concern   Not on file  Social History Narrative   Divorced   No regular exercise   Occasional caffeine use   Social Determinants of Health   Financial Resource Strain: Not on file  Food Insecurity: Not on file  Transportation Needs: Not on file  Physical Activity: Not on file  Stress: Not on file  Social Connections: Not on  file  Intimate Partner Violence: Not on file    Allergies  Allergen Reactions   Acetazolamide Other (See Comments)    Jittery odd feeling. (hyper feeling)   Contrast Media [Iodinated Diagnostic Agents] Hives    In the 80's    No Known Allergies    Other Other (See Comments)    Transfer Dye---"Makes me tired"    Current Outpatient Medications  Medication Sig Dispense Refill   acetaminophen (TYLENOL) 500 MG tablet Take 1,000 mg by mouth every 8 (eight) hours as needed for mild pain or headache.      amLODipine (NORVASC) 10 MG tablet Take 10 mg by mouth daily. for high blood pressure  1   aspirin 81 MG tablet Take 81 mg by mouth daily.       calcium acetate (PHOSLO) 667 MG capsule Take 667-1,334 mg by mouth See admin instructions. 2 caps three times daily with meals, and 1 capsule with snacks  0   carvedilol (COREG) 12.5 MG tablet take 1 tablet by mouth twice daily.  DO NOT TAKE IN THE MORNING ON DIALYSIS DAYS 60 tablet 0   cloNIDine (CATAPRES) 0.1 MG tablet Take 1 tablet (0.1 mg total) by mouth at bedtime. (Patient taking differently: Take 0.1 mg by mouth 3 (three) times daily.)     clopidogrel (PLAVIX) 75 MG tablet Take 75 mg by mouth daily.       fenofibrate 160 MG tablet Take 160 mg by mouth daily.  0   fluticasone (FLONASE) 50 MCG/ACT nasal spray Place 1 spray into both nostrils daily.     folic acid (FOLVITE) 1 MG tablet Take 1 mg by mouth daily.     furosemide (LASIX) 20 MG tablet Take 80 mg by mouth 2 (two) times daily. Take 3 tabs twice daily on NON DIALYSIS DAYS      hydrALAZINE (APRESOLINE) 25 MG tablet Take 1 tablet (25 mg total) by mouth 3 (three) times daily. Please make overdue appt with Dr. Harrington Challenger before anymore refills. 1st attempt 90 tablet 0   insulin lispro (HUMALOG) 100 UNIT/ML KiwkPen Inject into the skin 3 (three) times daily. Give per sliding scale      rosuvastatin (CRESTOR) 20 MG tablet Take 20 mg by mouth daily.  0   TRESIBA FLEXTOUCH 100 UNIT/ML SOPN FlexTouch  Pen INJECT 46 UNITS SUBCUTANEOUSLY INTO SKIN ONCE D FOR DIABETES     No current facility-administered medications for this visit.    REVIEW OF SYSTEMS:  '[X]'$  denotes positive finding, '[ ]'$  denotes negative finding Cardiac  Comments:  Chest pain or chest pressure:    Shortness  of breath upon exertion:    Short of breath when lying flat:    Irregular heart rhythm:        Vascular    Pain in calf, thigh, or hip brought on by ambulation:    Pain in feet at night that wakes you up from your sleep:     Blood clot in your veins:    Leg swelling:         Pulmonary    Oxygen at home:    Productive cough:     Wheezing:         Neurologic    Sudden weakness in arms or legs:     Sudden numbness in arms or legs:     Sudden onset of difficulty speaking or slurred speech:    Temporary loss of vision in one eye:     Problems with dizziness:         Gastrointestinal    Blood in stool:     Vomited blood:         Genitourinary    Burning when urinating:     Blood in urine:        Psychiatric    Major depression:         Hematologic    Bleeding problems:    Problems with blood clotting too easily:        Skin    Rashes or ulcers:        Constitutional    Fever or chills:      PHYSICAL EXAM: Vitals:   10/06/20 1023  BP: (!) 173/84  Pulse: 78  Resp: 18  Temp: (!) 97.3 F (36.3 C)  TempSrc: Temporal  Weight: 250 lb (113.4 kg)    GENERAL: The patient is a well-nourished male, in no acute distress. The vital signs are documented above. CARDIAC: There is a regular rate and rhythm.  VASCULAR:  Right brachial pulse 1+ palpable and difficult to appreciate any pulses at the right wrist Left brachial pulse 2+ palpable and difficult to appreciate any pulses at the left wrist Left brachiocephalic fistula thrombosed with no thrill No upper extremity tissue loss PULMONARY: No respiratory distress. ABDOMEN: Soft and non-tender. MUSCULOSKELETAL: There are no major deformities or  cyanosis. NEUROLOGIC: No focal weakness or paresthesias are detected. SKIN: There are no ulcers or rashes noted. PSYCHIATRIC: The patient has a normal affect.  DATA:   Vein mapping shows usable basilic vein in the left arm which is his nondominant arm.  Assessment/Plan:  65 year old male presents for permanent hemodialysis access evaluation in the setting of end-stage renal disease and failed left arm brachiocephalic AV fistula.  Patient is right-handed and would prefer to keep dialysis access in the left arm.  He does appear to have a usable basilic vein in the left arm based on vein mapping today.  I have offered him left arm AVF and discussed typically a two-stage operation if we use the basilic vein.  Risk and benefits were discussed including bleeding, infection, failure to mature, risk of steal etc.  We will get him scheduled today.   Marty Heck, MD [facility] Office: [phone]

## 2020-10-06 NOTE — Progress Notes (Signed)
Patient name: [name]. MRN: [id number] DOB: 08-07-1955 Sex: male  REASON FOR CONSULT: Evaluate for permanent hemodialysis access  HPI: [name]. is a 65 y.o. male, with multiple medical comorbidities listed below including end-stage renal disease on dialysis Monday Wednesday Friday that presents for evaluation of new dialysis access.  Patient previously had a left arm brachiocephalic AV fistula that was placed in 2017.  Has had multiple additional interventions to maintain patency with fistulograms in left arm for peripheral stenosis.  He states this thrombosed about 3 weeks ago and is no longer working.  He has a right IJ catheter that he is using for dialysis at this time.  States he is right-handed.  He has bilateral BKA's and is in a wheelchair today.  Past Medical History:  Diagnosis Date   AICD (automatic cardioverter/defibrillator) present    Anemia    Arthritis    Cerebrovascular disease    CHF (congestive heart failure) (HCC)    Chronic kidney disease    ESRD, MWF HD   Diabetes mellitus    type II   History of TIAs    Hyperlipidemia    Hypertension    Nonischemic cardiomyopathy (HCC)    PVD (peripheral vascular disease) (HCC)    s/p B BKA   Shortness of breath dyspnea    with exertion    Skin disease    Rare   Stroke ([place])    "light stroke"   Tobacco abuse     Past Surgical History:  Procedure Laterality Date   A/V FISTULAGRAM Left 06/24/2016   Procedure: A/V Fistulagram;  Surgeon: Elam Dutch, MD;  Location: [facility];  Service: Cardiovascular;  Laterality: Left;   A/V FISTULAGRAM N/A 01/17/2017   Procedure: A/V Fistulagram - left arm;  Surgeon: Serafina Mitchell, MD;  Location: [facility];  Service: Cardiovascular;  Laterality: N/A;   AV FISTULA PLACEMENT Left 04/08/2015   Procedure: Creation of Left arm BRACHIOCEPHALIC ARTERIOVENOUS  FISTULA ;  Surgeon: Mal Misty, MD;  Location: [facility];  Service: Vascular;  Laterality:  Left;   CARDIAC DEFIBRILLATOR PLACEMENT     CARDIAC DEFIBRILLATOR PLACEMENT     CATARACT EXTRACTION W/PHACO Right 03/17/2015   Procedure: CATARACT EXTRACTION PHACO AND INTRAOCULAR LENS PLACEMENT ([place]);  Surgeon: Rutherford Guys, MD;  Location: [facility];  Service: Ophthalmology;  Laterality: Right;  CDE: 6.59   EYE SURGERY Right    Cataract   LEG AMPUTATION BELOW KNEE     bilateral   PERIPHERAL VASCULAR INTERVENTION  01/17/2017   Procedure: PERIPHERAL VASCULAR INTERVENTION;  Surgeon: Serafina Mitchell, MD;  Location: [facility];  Service: Cardiovascular;;  PTA  left arm fistula    Family History  Problem Relation Age of Onset   Diabetes Father    Stroke Father    Diabetes Brother    Diabetes Brother    Diabetes Brother    Diabetes Brother    Heart failure Mother    Diabetes Mother    Diabetes Other    Coronary artery disease Other     SOCIAL HISTORY: Social History   Socioeconomic History   Marital status: Legally Separated    Spouse name: Not on file   Number of children: Not on file   Years of education: Not on file   Highest education level: Not on file  Occupational History   Occupation: Disabled    Employer: [employer]  Tobacco Use   Smoking  status: Former    Packs/day: 1.00    Years: 20.00    Pack years: 20.00    Types: Cigarettes    Quit date: 03/12/1998    Years since quitting: 22.5   Smokeless tobacco: Never  Vaping Use   Vaping Use: Never used  Substance and Sexual Activity   Alcohol use: Yes    Alcohol/week: 0.0 standard drinks    Comment: occasional   Drug use: No   Sexual activity: Not on file  Other Topics Concern   Not on file  Social History Narrative   Divorced   No regular exercise   Occasional caffeine use   Social Determinants of Health   Financial Resource Strain: Not on file  Food Insecurity: Not on file  Transportation Needs: Not on file  Physical Activity: Not on file  Stress: Not on file  Social Connections: Not on  file  Intimate Partner Violence: Not on file    Allergies  Allergen Reactions   Acetazolamide Other (See Comments)    Jittery odd feeling. (hyper feeling)   Contrast Media [Iodinated Diagnostic Agents] Hives    In the 80's    No Known Allergies    Other Other (See Comments)    Transfer Dye---"Makes me tired"    Current Outpatient Medications  Medication Sig Dispense Refill   acetaminophen (TYLENOL) 500 MG tablet Take 1,000 mg by mouth every 8 (eight) hours as needed for mild pain or headache.      amLODipine (NORVASC) 10 MG tablet Take 10 mg by mouth daily. for high blood pressure  1   aspirin 81 MG tablet Take 81 mg by mouth daily.       calcium acetate (PHOSLO) 667 MG capsule Take 667-1,334 mg by mouth See admin instructions. 2 caps three times daily with meals, and 1 capsule with snacks  0   carvedilol (COREG) 12.5 MG tablet take 1 tablet by mouth twice daily.  DO NOT TAKE IN THE MORNING ON DIALYSIS DAYS 60 tablet 0   cloNIDine (CATAPRES) 0.1 MG tablet Take 1 tablet (0.1 mg total) by mouth at bedtime. (Patient taking differently: Take 0.1 mg by mouth 3 (three) times daily.)     clopidogrel (PLAVIX) 75 MG tablet Take 75 mg by mouth daily.       fenofibrate 160 MG tablet Take 160 mg by mouth daily.  0   fluticasone (FLONASE) 50 MCG/ACT nasal spray Place 1 spray into both nostrils daily.     folic acid (FOLVITE) 1 MG tablet Take 1 mg by mouth daily.     furosemide (LASIX) 20 MG tablet Take 80 mg by mouth 2 (two) times daily. Take 3 tabs twice daily on NON DIALYSIS DAYS      hydrALAZINE (APRESOLINE) 25 MG tablet Take 1 tablet (25 mg total) by mouth 3 (three) times daily. Please make overdue appt with Dr. Harrington Challenger before anymore refills. 1st attempt 90 tablet 0   insulin lispro (HUMALOG) 100 UNIT/ML KiwkPen Inject into the skin 3 (three) times daily. Give per sliding scale      rosuvastatin (CRESTOR) 20 MG tablet Take 20 mg by mouth daily.  0   TRESIBA FLEXTOUCH 100 UNIT/ML SOPN FlexTouch  Pen INJECT 46 UNITS SUBCUTANEOUSLY INTO SKIN ONCE D FOR DIABETES     No current facility-administered medications for this visit.    REVIEW OF SYSTEMS:  '[X]'$  denotes positive finding, '[ ]'$  denotes negative finding Cardiac  Comments:  Chest pain or chest pressure:    Shortness  of breath upon exertion:    Short of breath when lying flat:    Irregular heart rhythm:        Vascular    Pain in calf, thigh, or hip brought on by ambulation:    Pain in feet at night that wakes you up from your sleep:     Blood clot in your veins:    Leg swelling:         Pulmonary    Oxygen at home:    Productive cough:     Wheezing:         Neurologic    Sudden weakness in arms or legs:     Sudden numbness in arms or legs:     Sudden onset of difficulty speaking or slurred speech:    Temporary loss of vision in one eye:     Problems with dizziness:         Gastrointestinal    Blood in stool:     Vomited blood:         Genitourinary    Burning when urinating:     Blood in urine:        Psychiatric    Major depression:         Hematologic    Bleeding problems:    Problems with blood clotting too easily:        Skin    Rashes or ulcers:        Constitutional    Fever or chills:      PHYSICAL EXAM: Vitals:   10/06/20 1023  BP: (!) 173/84  Pulse: 78  Resp: 18  Temp: (!) 97.3 F (36.3 C)  TempSrc: Temporal  Weight: 250 lb (113.4 kg)    GENERAL: The patient is a well-nourished male, in no acute distress. The vital signs are documented above. CARDIAC: There is a regular rate and rhythm.  VASCULAR:  Right brachial pulse 1+ palpable and difficult to appreciate any pulses at the right wrist Left brachial pulse 2+ palpable and difficult to appreciate any pulses at the left wrist Left brachiocephalic fistula thrombosed with no thrill No upper extremity tissue loss PULMONARY: No respiratory distress. ABDOMEN: Soft and non-tender. MUSCULOSKELETAL: There are no major deformities or  cyanosis. NEUROLOGIC: No focal weakness or paresthesias are detected. SKIN: There are no ulcers or rashes noted. PSYCHIATRIC: The patient has a normal affect.  DATA:   Vein mapping shows usable basilic vein in the left arm which is his nondominant arm.  Assessment/Plan:  65 year old male presents for permanent hemodialysis access evaluation in the setting of end-stage renal disease and failed left arm brachiocephalic AV fistula.  Patient is right-handed and would prefer to keep dialysis access in the left arm.  He does appear to have a usable basilic vein in the left arm based on vein mapping today.  I have offered him left arm AVF and discussed typically a two-stage operation if we use the basilic vein.  Risk and benefits were discussed including bleeding, infection, failure to mature, risk of steal etc.  We will get him scheduled today.   Marty Heck, MD [facility] Office: [phone]

## 2020-10-08 ENCOUNTER — Other Ambulatory Visit: Payer: Self-pay

## 2020-10-20 ENCOUNTER — Other Ambulatory Visit: Payer: Self-pay | Admitting: Internal Medicine

## 2020-10-22 ENCOUNTER — Encounter (HOSPITAL_COMMUNITY)
Admission: RE | Admit: 2020-10-22 | Discharge: 2020-10-22 | Disposition: A | Discharge status: Home / Self Care | Payer: Medicare Other | Source: Ambulatory Visit | Attending: Vascular Surgery | Admitting: Vascular Surgery

## 2020-10-22 ENCOUNTER — Other Ambulatory Visit: Payer: Self-pay

## 2020-10-27 ENCOUNTER — Ambulatory Visit (HOSPITAL_COMMUNITY): Payer: Medicare Other | Admitting: Certified Registered Nurse Anesthetist

## 2020-10-27 ENCOUNTER — Encounter (HOSPITAL_COMMUNITY)
Admission: RE | Disposition: A | Discharge status: Home / Self Care | Payer: Self-pay | Source: Home / Self Care | Attending: Vascular Surgery

## 2020-10-27 ENCOUNTER — Ambulatory Visit (HOSPITAL_COMMUNITY)
Admission: RE | Admit: 2020-10-27 | Discharge: 2020-10-27 | Disposition: A | Discharge status: Home / Self Care | Payer: Medicare Other | Attending: Vascular Surgery | Admitting: Vascular Surgery

## 2020-10-27 ENCOUNTER — Encounter (HOSPITAL_COMMUNITY): Payer: Self-pay | Admitting: Vascular Surgery

## 2020-10-27 DIAGNOSIS — I509 Heart failure, unspecified: Secondary | ICD-10-CM | POA: Diagnosis not present

## 2020-10-27 DIAGNOSIS — Z794 Long term (current) use of insulin: Secondary | ICD-10-CM | POA: Diagnosis not present

## 2020-10-27 DIAGNOSIS — Z888 Allergy status to other drugs, medicaments and biological substances status: Secondary | ICD-10-CM | POA: Insufficient documentation

## 2020-10-27 DIAGNOSIS — Z79899 Other long term (current) drug therapy: Secondary | ICD-10-CM | POA: Insufficient documentation

## 2020-10-27 DIAGNOSIS — Z91041 Radiographic dye allergy status: Secondary | ICD-10-CM | POA: Diagnosis not present

## 2020-10-27 DIAGNOSIS — N186 End stage renal disease: Secondary | ICD-10-CM

## 2020-10-27 DIAGNOSIS — Z87891 Personal history of nicotine dependence: Secondary | ICD-10-CM | POA: Diagnosis not present

## 2020-10-27 DIAGNOSIS — Z7982 Long term (current) use of aspirin: Secondary | ICD-10-CM | POA: Diagnosis not present

## 2020-10-27 DIAGNOSIS — Z7902 Long term (current) use of antithrombotics/antiplatelets: Secondary | ICD-10-CM | POA: Diagnosis not present

## 2020-10-27 DIAGNOSIS — Z95810 Presence of automatic (implantable) cardiac defibrillator: Secondary | ICD-10-CM | POA: Diagnosis not present

## 2020-10-27 DIAGNOSIS — E1122 Type 2 diabetes mellitus with diabetic chronic kidney disease: Secondary | ICD-10-CM | POA: Insufficient documentation

## 2020-10-27 DIAGNOSIS — Z992 Dependence on renal dialysis: Secondary | ICD-10-CM | POA: Insufficient documentation

## 2020-10-27 DIAGNOSIS — I132 Hypertensive heart and chronic kidney disease with heart failure and with stage 5 chronic kidney disease, or end stage renal disease: Secondary | ICD-10-CM | POA: Insufficient documentation

## 2020-10-27 HISTORY — PX: AV FISTULA PLACEMENT: SHX1204

## 2020-10-27 LAB — POCT I-STAT, CHEM 8
BUN: 34 mg/dL — ABNORMAL HIGH (ref 8–23)
Calcium, Ion: 1.13 mmol/L — ABNORMAL LOW (ref 1.15–1.40)
Chloride: 103 mmol/L (ref 98–111)
Creatinine, Ser: 6.8 mg/dL — ABNORMAL HIGH (ref 0.61–1.24)
Glucose, Bld: 190 mg/dL — ABNORMAL HIGH (ref 70–99)
HCT: 32 % — ABNORMAL LOW (ref 39.0–52.0)
Hemoglobin: 10.9 g/dL — ABNORMAL LOW (ref 13.0–17.0)
Potassium: 4.6 mmol/L (ref 3.5–5.1)
Sodium: 139 mmol/L (ref 135–145)
TCO2: 27 mmol/L (ref 22–32)

## 2020-10-27 LAB — GLUCOSE, CAPILLARY: Glucose-Capillary: 185 mg/dL — ABNORMAL HIGH (ref 70–99)

## 2020-10-27 SURGERY — ARTERIOVENOUS (AV) FISTULA CREATION
Anesthesia: General | Laterality: Left

## 2020-10-27 MED ORDER — 0.9 % SODIUM CHLORIDE (POUR BTL) OPTIME
TOPICAL | Status: DC | PRN
  Administered 2020-10-27: 1000 mL

## 2020-10-27 MED ORDER — FENTANYL CITRATE (PF) 100 MCG/2ML IJ SOLN
INTRAMUSCULAR | Status: AC
  Filled 2020-10-27: qty 2

## 2020-10-27 MED ORDER — LIDOCAINE-EPINEPHRINE 0.5 %-1:200000 IJ SOLN
INTRAMUSCULAR | Status: DC | PRN
  Administered 2020-10-27: 30 mL

## 2020-10-27 MED ORDER — HEPARIN 6000 UNIT IRRIGATION SOLUTION
Status: DC | PRN
  Administered 2020-10-27: 1

## 2020-10-27 MED ORDER — ONDANSETRON HCL 4 MG/2ML IJ SOLN: 4.0000 mg | Freq: Once | INTRAMUSCULAR | Status: DC | PRN

## 2020-10-27 MED ORDER — PROPOFOL 500 MG/50ML IV EMUL
INTRAVENOUS | Status: DC | PRN
  Administered 2020-10-27: 25 ug/kg/min via INTRAVENOUS

## 2020-10-27 MED ORDER — LACTATED RINGERS IV SOLN: INTRAVENOUS | Status: DC

## 2020-10-27 MED ORDER — OXYCODONE-ACETAMINOPHEN 5-325 MG PO TABS: 1.0000 | ORAL_TABLET | Freq: Four times a day (QID) | ORAL | 0 refills | Status: DC | PRN

## 2020-10-27 MED ORDER — LIDOCAINE-EPINEPHRINE 0.5 %-1:200000 IJ SOLN
INTRAMUSCULAR | Status: AC
  Filled 2020-10-27: qty 1

## 2020-10-27 MED ORDER — FENTANYL CITRATE (PF) 100 MCG/2ML IJ SOLN: 25.0000 ug | INTRAMUSCULAR | Status: DC | PRN

## 2020-10-27 MED ORDER — LIDOCAINE HCL (PF) 2 % IJ SOLN
INTRAMUSCULAR | Status: AC
  Filled 2020-10-27: qty 5

## 2020-10-27 MED ORDER — HEPARIN SODIUM (PORCINE) 1000 UNIT/ML IJ SOLN
INTRAMUSCULAR | Status: AC
  Filled 2020-10-27: qty 6

## 2020-10-27 MED ORDER — SODIUM CHLORIDE 0.9 % IV SOLN
INTRAVENOUS | Status: DC
  Administered 2020-10-27: 1000 mL via INTRAVENOUS

## 2020-10-27 MED ORDER — VASOPRESSIN 20 UNIT/ML IV SOLN
INTRAVENOUS | Status: AC
  Filled 2020-10-27: qty 1

## 2020-10-27 MED ORDER — ORAL CARE MOUTH RINSE: 15.0000 mL | Freq: Once | OROMUCOSAL | Status: DC

## 2020-10-27 MED ORDER — PHENYLEPHRINE HCL-NACL 10-0.9 MG/250ML-% IV SOLN
INTRAVENOUS | Status: AC
  Filled 2020-10-27: qty 250

## 2020-10-27 MED ORDER — CEFAZOLIN SODIUM-DEXTROSE 2-4 GM/100ML-% IV SOLN
2.0000 g | INTRAVENOUS | Status: AC
  Administered 2020-10-27: 2 g via INTRAVENOUS

## 2020-10-27 MED ORDER — CHLORHEXIDINE GLUCONATE 4 % EX LIQD: 60.0000 mL | Freq: Once | CUTANEOUS | Status: DC

## 2020-10-27 MED ORDER — CEFAZOLIN SODIUM-DEXTROSE 2-4 GM/100ML-% IV SOLN
INTRAVENOUS | Status: AC
  Filled 2020-10-27: qty 100

## 2020-10-27 MED ORDER — MIDAZOLAM HCL 2 MG/2ML IJ SOLN
INTRAMUSCULAR | Status: AC
  Filled 2020-10-27: qty 2

## 2020-10-27 MED ORDER — CHLORHEXIDINE GLUCONATE 0.12 % MT SOLN
OROMUCOSAL | Status: AC
  Administered 2020-10-27: 15 mL
  Filled 2020-10-27: qty 15

## 2020-10-27 MED ORDER — LIDOCAINE HCL (CARDIAC) PF 100 MG/5ML IV SOSY
PREFILLED_SYRINGE | INTRAVENOUS | Status: DC | PRN
  Administered 2020-10-27: 80 mg via INTRATRACHEAL

## 2020-10-27 MED ORDER — MIDAZOLAM HCL 2 MG/2ML IJ SOLN
INTRAMUSCULAR | Status: DC | PRN
  Administered 2020-10-27: 2 mg via INTRAVENOUS

## 2020-10-27 MED ORDER — FENTANYL CITRATE (PF) 100 MCG/2ML IJ SOLN
INTRAMUSCULAR | Status: DC | PRN
  Administered 2020-10-27 (×4): 25 ug via INTRAVENOUS

## 2020-10-27 MED ORDER — PROPOFOL 10 MG/ML IV BOLUS
INTRAVENOUS | Status: AC
  Filled 2020-10-27: qty 40

## 2020-10-27 MED ORDER — CHLORHEXIDINE GLUCONATE 0.12 % MT SOLN: 15.0000 mL | Freq: Once | OROMUCOSAL | Status: DC

## 2020-10-27 MED ORDER — SEVOFLURANE IN SOLN
RESPIRATORY_TRACT | Status: AC
  Filled 2020-10-27: qty 250

## 2020-10-27 SURGICAL SUPPLY — 42 items
ADH SKN CLS APL DERMABOND .7 (GAUZE/BANDAGES/DRESSINGS) ×1
ARMBAND PINK RESTRICT EXTREMIT (MISCELLANEOUS) ×2 IMPLANT
BAG HAMPER (MISCELLANEOUS) ×2 IMPLANT
CANNULA VESSEL 3MM 2 BLNT TIP (CANNULA) ×2 IMPLANT
CLIP LIGATING EXTRA MED SLVR (CLIP) ×2 IMPLANT
CLIP LIGATING EXTRA SM BLUE (MISCELLANEOUS) ×2 IMPLANT
COVER LIGHT HANDLE STERIS (MISCELLANEOUS) ×4 IMPLANT
COVER MAYO STAND XLG (MISCELLANEOUS) ×2 IMPLANT
COVER PROBE W GEL 5X96 (DRAPES) IMPLANT
DECANTER SPIKE VIAL GLASS SM (MISCELLANEOUS) ×2 IMPLANT
DERMABOND ADVANCED (GAUZE/BANDAGES/DRESSINGS) ×1
DERMABOND ADVANCED .7 DNX12 (GAUZE/BANDAGES/DRESSINGS) ×1 IMPLANT
ELECT REM PT RETURN 9FT ADLT (ELECTROSURGICAL) ×2
ELECTRODE REM PT RTRN 9FT ADLT (ELECTROSURGICAL) ×1 IMPLANT
GAUZE SPONGE 4X4 12PLY STRL (GAUZE/BANDAGES/DRESSINGS) ×2 IMPLANT
GLOVE SS BIOGEL STRL SZ 7.5 (GLOVE) ×1 IMPLANT
GLOVE SUPERSENSE BIOGEL SZ 7.5 (GLOVE) ×1
GLOVE SURG ENC TEXT LTX SZ7 (GLOVE) ×4 IMPLANT
GLOVE SURG UNDER POLY LF SZ7 (GLOVE) ×6 IMPLANT
GOWN STRL REUS W/TWL LRG LVL3 (GOWN DISPOSABLE) ×6 IMPLANT
IV NS 500ML (IV SOLUTION) ×2
IV NS 500ML BAXH (IV SOLUTION) ×1 IMPLANT
KIT BLADEGUARD II DBL (SET/KITS/TRAYS/PACK) ×2 IMPLANT
KIT TURNOVER KIT A (KITS) ×2 IMPLANT
MANIFOLD NEPTUNE II (INSTRUMENTS) ×2 IMPLANT
MARKER SKIN DUAL TIP RULER LAB (MISCELLANEOUS) ×4 IMPLANT
NDL HYPO 18GX1.5 BLUNT FILL (NEEDLE) ×1 IMPLANT
NEEDLE HYPO 18GX1.5 BLUNT FILL (NEEDLE) ×2 IMPLANT
NS IRRIG 1000ML POUR BTL (IV SOLUTION) ×2 IMPLANT
PACK CV ACCESS (CUSTOM PROCEDURE TRAY) ×2 IMPLANT
PAD ARMBOARD 7.5X6 YLW CONV (MISCELLANEOUS) ×4 IMPLANT
SET BASIN LINEN APH (SET/KITS/TRAYS/PACK) ×2 IMPLANT
SOL PREP POV-IOD 4OZ 10% (MISCELLANEOUS) ×2 IMPLANT
SOL PREP PROV IODINE SCRUB 4OZ (MISCELLANEOUS) ×2 IMPLANT
SUT PROLENE 6 0 CC (SUTURE) ×2 IMPLANT
SUT SILK 2 0 SH (SUTURE) IMPLANT
SUT VIC AB 3-0 SH 27 (SUTURE) ×2
SUT VIC AB 3-0 SH 27X BRD (SUTURE) ×1 IMPLANT
SYR 10ML LL (SYRINGE) ×2 IMPLANT
SYR BULB IRRIG 60ML STRL (SYRINGE) ×2 IMPLANT
SYR CONTROL 10ML LL (SYRINGE) ×2 IMPLANT
UNDERPAD 30X36 HEAVY ABSORB (UNDERPADS AND DIAPERS) ×2 IMPLANT

## 2020-10-27 NOTE — Anesthesia Postprocedure Evaluation (Signed)
Anesthesia Post Note  Patient: [name].  Procedure(s) Performed: LEFT ARM ARTERIOVENOUS (AV) FISTULA CREATION VERSUS GRAFT PLACEMENT (Left)  Patient location during evaluation: Phase II Anesthesia Type: General Level of consciousness: awake Pain management: pain level controlled Vital Signs Assessment: post-procedure vital signs reviewed and stable Respiratory status: spontaneous breathing and respiratory function stable Cardiovascular status: blood pressure returned to baseline and stable Postop Assessment: no headache and no apparent nausea or vomiting Anesthetic complications: no Comments: Late entry   No notable events documented.   Last Vitals:  Vitals:   10/27/20 0930 10/27/20 0945  BP: (!) 154/65 (!) 151/67  Pulse: 63 64  Resp: 16 17  Temp:    SpO2: 94% 97%    Last Pain:  Vitals:   10/27/20 1010  TempSrc:   PainSc: 0-No pain                 Louann Sjogren

## 2020-10-27 NOTE — Anesthesia Preprocedure Evaluation (Signed)
Anesthesia Evaluation  Patient identified by MRN, date of birth, ID band Patient awake    Reviewed: Allergy & Precautions, H&P , NPO status , Patient's Chart, lab work & pertinent test results, reviewed documented beta blocker date and time   Airway Mallampati: II  TM Distance: >3 FB Neck ROM: full    Dental no notable dental hx.    Pulmonary shortness of breath, former smoker,    Pulmonary exam normal breath sounds clear to auscultation       Cardiovascular Exercise Tolerance: Good hypertension, + Peripheral Vascular Disease and +CHF  + Cardiac Defibrillator  Rhythm:regular Rate:Normal     Neuro/Psych TIACVA negative psych ROS   GI/Hepatic negative GI ROS, Neg liver ROS,   Endo/Other  negative endocrine ROSdiabetes  Renal/GU ESRF and CRFRenal disease  negative genitourinary   Musculoskeletal   Abdominal   Peds  Hematology  (+) Blood dyscrasia, anemia ,   Anesthesia Other Findings   Reproductive/Obstetrics negative OB ROS                             Anesthesia Physical Anesthesia Plan  ASA: 3  Anesthesia Plan: General LMA   Post-op Pain Management:    Induction:   PONV Risk Score and Plan: Ondansetron  Airway Management Planned:   Additional Equipment:   Intra-op Plan:   Post-operative Plan:   Informed Consent: I have reviewed the patients History and Physical, chart, labs and discussed the procedure including the risks, benefits and alternatives for the proposed anesthesia with the patient or authorized representative who has indicated his/her understanding and acceptance.     Dental Advisory Given  Plan Discussed with: CRNA  Anesthesia Plan Comments:         Anesthesia Quick Evaluation

## 2020-10-27 NOTE — Transfer of Care (Signed)
Immediate Anesthesia Transfer of Care Note  Patient: [name].  Procedure(s) Performed: LEFT ARM ARTERIOVENOUS (AV) FISTULA CREATION VERSUS GRAFT PLACEMENT (Left)  Patient Location: PACU  Anesthesia Type:General  Level of Consciousness: awake and alert   Airway & Oxygen Therapy: Patient Spontanous Breathing  Post-op Assessment: Report given to RN and Post -op Vital signs reviewed and stable  Post vital signs: Reviewed and stable  Last Vitals:  Vitals Value Taken Time  BP 116/52 10/27/20 0900  Temp    Pulse 64 10/27/20 0901  Resp 14 10/27/20 0901  SpO2 94 % 10/27/20 0901  Vitals shown include unvalidated device data.  Last Pain:  Vitals:   10/27/20 0653  TempSrc: Oral  PainSc: 0-No pain      Patients Stated Pain Goal: 5 ([id number])  Complications: No notable events documented.

## 2020-10-27 NOTE — Op Note (Signed)
    OPERATIVE REPORT  DATE OF SURGERY: 10/27/2020  PATIENT: [name] Pick., 65 y.o. male MRN: [id number]  DOB: 03-19-1956  PRE-OPERATIVE DIAGNOSIS: End-stage renal disease  POST-OPERATIVE DIAGNOSIS:  Same  PROCEDURE: Left first stage brachiobasilic fistula creation  SURGEON:  Curt Jews, M.D.  PHYSICIAN ASSISTANT: Vevelyn Royals, RN  The assistant was needed for exposure and to expedite the case  ANESTHESIA: Local with sedation  EBL: per anesthesia record  Total I/O In: 620 [P.O.:120; I.V.:400; IV Piggyback:100] Out: 5 [Blood:5]  BLOOD ADMINISTERED: none  DRAINS: none  SPECIMEN: none  COUNTS CORRECT:  YES  PATIENT DISPOSITION:  PACU - hemodynamically stable  PROCEDURE DETAILS: The patient was taken operating placed supine position where the area of the left arm prepped draped you sterile fashion.  The patient had a brachiocephalic fistula which had failed.  He does have a left AICD but had no difficulty with swelling when his brachiobasilic fistula was patent.  SonoSite ultrasound was used to visualize the basilic vein which was of good caliber.  He had branches above the elbow and the larger of these 2 was chosen.  Incision was made over the medial aspect of the elbow and carried down to isolate the basilic vein which was of good caliber.  The vein was mobilized proximally and distally and tributary branches were ligated with 3-0 and 4-0 silk ties and divided.  The vein was ligated distally and divided and was gently dilated and was of good caliber.  Separate incision was made over the brachial artery and the old brachiocephalic fistula was anastomosis was exposed.  The cephalic vein was occluded near the brachial artery and did have flow at this level prior to occlusion.  The vein was divided and was spatulated.  A tunnel was created from the basilic vein to the brachial artery and the vein was brought through the tunnel.  The basilic vein was cut to the appropriate length and  was spatulated and sewn end-to-side to the artery with a running 6-0 Prolene suture.  Clamps were removed and excellent thrill was noted.  Wounds irrigated with saline.  Hemostasis test cautery.  Wounds were closed with 3-0 Vicryl in the subcutaneous and subcuticular tissue.  Sterile dressing was applied and the patient was transferred to the recovery room stable condition   [name], M.D., [facility] 10/27/2020 11:34 AM  Note: Portions of this report may have been transcribed using voice recognition software.  Every effort has been made to ensure accuracy; however, inadvertent computerized transcription errors may still be present.

## 2020-10-27 NOTE — Discharge Instructions (Signed)
[facility] of [facility]  Discharge Instructions  AV Fistula or Graft Surgery for Dialysis Access  Please refer to the following instructions for your post-procedure care. Your surgeon or physician assistant will discuss any changes with you.  Activity  You may drive the day following your surgery, if you are comfortable and no longer taking prescription pain medication. Resume full activity as the soreness in your incision resolves.  Bathing/Showering  You may shower after you go home. Keep your incision dry for 48 hours. Do not soak in a bathtub, hot tub, or swim until the incision heals completely. You may not shower if you have a hemodialysis catheter.  Incision Care  Clean your incision with mild soap and water after 48 hours. Pat the area dry with a clean towel. You do not need a bandage unless otherwise instructed. Do not apply any ointments or creams to your incision. You may have skin glue on your incision. Do not peel it off. It will come off on its own in about one week. Your arm may swell a bit after surgery. To reduce swelling use pillows to elevate your arm so it is above your heart. Your doctor will tell you if you need to lightly wrap your arm with an ACE bandage.  Diet  Resume your normal diet. There are not special food restrictions following this procedure. In order to heal from your surgery, it is CRITICAL to get adequate nutrition. Your body requires vitamins, minerals, and protein. Vegetables are the best source of vitamins and minerals. Vegetables also provide the perfect balance of protein. Processed food has little nutritional value, so try to avoid this.  Medications  Resume taking all of your medications. If your incision is causing pain, you may take over-the counter pain relievers such as acetaminophen (Tylenol). If you were prescribed a stronger pain medication, please be aware these medications can cause nausea and constipation. Prevent  nausea by taking the medication with a snack or meal. Avoid constipation by drinking plenty of fluids and eating foods with high amount of fiber, such as fruits, vegetables, and grains.  Do not take Tylenol if you are taking prescription pain medications.  Follow up Your surgeon may want to see you in the office following your access surgery. If so, this will be arranged at the time of your surgery.  Please call us immediately for any of the following conditions:  Increased pain, redness, drainage (pus) from your incision site Fever of 101 degrees or higher Severe or worsening pain at your incision site Hand pain or numbness.  Reduce your risk of vascular disease:  Stop smoking. If you would like help, call [facility] at 1-800-QUIT-NOW (680)295-6414) or [place] at [place] your cholesterol Maintain a desired weight Control your diabetes Keep your blood pressure down  Dialysis  It will take several weeks to several months for your new dialysis access to be ready for use. Your surgeon will determine when it is okay to use it. Your nephrologist will continue to direct your dialysis. You can continue to use your Permcath until your new access is ready for use.   10/27/2020 [name]. [id number] [birth year]  Surgeon(s): Scotland Korver, Arvilla Meres, MD  Procedure(s): LEFT ARM ARTERIOVENOUS (AV) FISTULA CREATION VERSUS GRAFT PLACEMENT   May stick graft immediately   May stick graft on designated area only:    Do not stick fistula for 12 weeks    If you have any questions, please call  the office at [phone].

## 2020-10-27 NOTE — Interval H&P Note (Signed)
History and Physical Interval Note:  10/27/2020 7:15 AM  [name] Pick.  has presented today for surgery, with the diagnosis of ESRD.  The various methods of treatment have been discussed with the patient and family. After consideration of risks, benefits and other options for treatment, the patient has consented to  Procedure(s): LEFT ARM ARTERIOVENOUS (AV) FISTULA CREATION VERSUS GRAFT PLACEMENT (Left) as a surgical intervention.  The patient's history has been reviewed, patient examined, no change in status, stable for surgery.  I have reviewed the patient's chart and labs.  Questions were answered to the patient's satisfaction.     Curt Jews

## 2020-10-28 ENCOUNTER — Encounter (HOSPITAL_COMMUNITY): Payer: Self-pay | Admitting: Vascular Surgery

## 2020-10-29 ENCOUNTER — Ambulatory Visit (INDEPENDENT_AMBULATORY_CARE_PROVIDER_SITE_OTHER): Payer: Medicare Other

## 2020-10-29 DIAGNOSIS — I428 Other cardiomyopathies: Secondary | ICD-10-CM

## 2020-10-30 LAB — CUP PACEART REMOTE DEVICE CHECK
Battery Remaining Longevity: 36 mo
Battery Remaining Percentage: 38 %
Brady Statistic RV Percent Paced: 0 %
Date Time Interrogation Session: 20220728031100
HighPow Impedance: 48 Ohm
Implantable Lead Implant Date: 20050810
Implantable Lead Location: 753860
Implantable Lead Model: 158
Implantable Lead Serial Number: 153491
Implantable Pulse Generator Implant Date: 20110722
Lead Channel Impedance Value: 530 Ohm
Lead Channel Pacing Threshold Amplitude: 0.7 V
Lead Channel Pacing Threshold Pulse Width: 0.4 ms
Lead Channel Setting Pacing Amplitude: 2.5 V
Lead Channel Setting Pacing Pulse Width: 0.4 ms
Lead Channel Setting Sensing Sensitivity: 0.4 mV
Pulse Gen Serial Number: 269222

## 2020-11-24 ENCOUNTER — Other Ambulatory Visit: Payer: Self-pay | Admitting: Internal Medicine

## 2020-11-25 NOTE — Progress Notes (Signed)
Remote ICD transmission.   

## 2020-12-09 ENCOUNTER — Encounter: Payer: Medicare Other | Admitting: Vascular Surgery

## 2021-01-05 ENCOUNTER — Other Ambulatory Visit: Payer: Self-pay | Admitting: *Deleted

## 2021-01-05 DIAGNOSIS — N186 End stage renal disease: Secondary | ICD-10-CM

## 2021-01-06 ENCOUNTER — Ambulatory Visit (INDEPENDENT_AMBULATORY_CARE_PROVIDER_SITE_OTHER): Payer: Self-pay | Admitting: Vascular Surgery

## 2021-01-06 ENCOUNTER — Ambulatory Visit (INDEPENDENT_AMBULATORY_CARE_PROVIDER_SITE_OTHER): Payer: Medicare Other

## 2021-01-06 ENCOUNTER — Other Ambulatory Visit: Payer: Self-pay

## 2021-01-06 ENCOUNTER — Encounter: Payer: Self-pay | Admitting: Vascular Surgery

## 2021-01-06 VITALS — BP 155/75 | HR 74 | Temp 97.9°F | Resp 18 | Wt 250.0 lb

## 2021-01-06 DIAGNOSIS — N186 End stage renal disease: Secondary | ICD-10-CM

## 2021-01-06 NOTE — H&P (View-Only) (Signed)
[facility]ist of [place]  Patient name: [name]. MRN: [id number] DOB: Jan 17, 1956 Sex: male  REASON FOR VISIT: Follow-up for stage left brachiobasilic fistula  HPI: Zakari Sprau. is a 65 y.o. male here today for follow-up.  He underwent for stage left brachiobasilic fistula creation on 10/27/2020.  He is is not have any significant swelling in his left arm.  He does have a defibrillator.  He does report occasional tingling in his left hand which is not significant.  No pain at the site of his antecubital incision.  Current Outpatient Medications  Medication Sig Dispense Refill   acetaminophen (TYLENOL) 500 MG tablet Take 1,000 mg by mouth every 8 (eight) hours as needed for mild pain or headache.      amLODipine (NORVASC) 10 MG tablet Take 10 mg by mouth daily. for high blood pressure  1   aspirin 81 MG tablet Take 81 mg by mouth daily.       calcium acetate (PHOSLO) 667 MG capsule Take 667-1,334 mg by mouth See admin instructions. 2 caps three times daily with meals, and 1 capsule with snacks  0   carvedilol (COREG) 12.5 MG tablet take 1 tablet by mouth twice daily.  DO NOT TAKE IN THE MORNING ON DIALYSIS DAYS 60 tablet 0   cloNIDine (CATAPRES) 0.1 MG tablet Take 1 tablet (0.1 mg total) by mouth at bedtime. (Patient taking differently: Take 0.1 mg by mouth 3 (three) times daily.)     clopidogrel (PLAVIX) 75 MG tablet Take 75 mg by mouth daily.       fenofibrate 160 MG tablet Take 160 mg by mouth daily.  0   fluticasone (FLONASE) 50 MCG/ACT nasal spray Place 1 spray into both nostrils daily.     folic acid (FOLVITE) 1 MG tablet Take 1 mg by mouth daily.     furosemide (LASIX) 20 MG tablet Take 80 mg by mouth 2 (two) times daily. Take 3 tabs twice daily on NON DIALYSIS DAYS      hydrALAZINE (APRESOLINE) 25 MG tablet Take 1 tablet (25 mg total) by mouth 3 (three) times daily. Please make overdue appt with Dr. Harrington Challenger before anymore  refills. 1st attempt 90 tablet 0   insulin lispro (HUMALOG) 100 UNIT/ML KiwkPen Inject into the skin 3 (three) times daily. Give per sliding scale      rosuvastatin (CRESTOR) 20 MG tablet Take 20 mg by mouth daily.  0   TRESIBA FLEXTOUCH 100 UNIT/ML SOPN FlexTouch Pen INJECT 46 UNITS SUBCUTANEOUSLY INTO SKIN ONCE D FOR DIABETES     oxyCODONE-acetaminophen (PERCOCET) 5-325 MG tablet Take 1 tablet by mouth every 6 (six) hours as needed for severe pain. (Patient not taking: Reported on 01/06/2021) 8 tablet 0   No current facility-administered medications for this visit.     PHYSICAL EXAM: Vitals:   01/06/21 1139  BP: (!) 155/75  Pulse: 74  Resp: 18  Temp: 97.9 F (36.6 C)  TempSrc: Temporal  SpO2: 97%  Weight: 250 lb (113.4 kg)    GENERAL: The patient is a well-nourished male, in no acute distress. The vital signs are documented above. Excellent thrill in his upper arm fistula.  He does have a palpable left radial pulse.  Duplex today shows good size maturation of his basilic vein.  MEDICAL ISSUES: Stable status post for stage brachiobasilic fistula creation.  I recommended second stage procedure.  He has had frequent difficulty with his catheter with infections and reports that this  had to be replaced several weeks ago.  We will schedule second stage transposition on 01/12/2021 at [place], MD FACS [facility] Tel [phone]  Note: Portions of this report may have been transcribed using voice recognition software.  Every effort has been made to ensure accuracy; however, inadvertent computerized transcription errors may still be present.

## 2021-01-06 NOTE — Progress Notes (Signed)
[facility]in Specialist of [place]  Patient name: [name]. MRN: [id number] DOB: 06/17/1955 Sex: male  REASON FOR VISIT: Follow-up for stage left brachiobasilic fistula  HPI: [name]. is a 65 y.o. male here today for follow-up.  He underwent for stage left brachiobasilic fistula creation on 10/27/2020.  He is is not have any significant swelling in his left arm.  He does have a defibrillator.  He does report occasional tingling in his left hand which is not significant.  No pain at the site of his antecubital incision.  Current Outpatient Medications  Medication Sig Dispense Refill   acetaminophen (TYLENOL) 500 MG tablet Take 1,000 mg by mouth every 8 (eight) hours as needed for mild pain or headache.      amLODipine (NORVASC) 10 MG tablet Take 10 mg by mouth daily. for high blood pressure  1   aspirin 81 MG tablet Take 81 mg by mouth daily.       calcium acetate (PHOSLO) 667 MG capsule Take 667-1,334 mg by mouth See admin instructions. 2 caps three times daily with meals, and 1 capsule with snacks  0   carvedilol (COREG) 12.5 MG tablet take 1 tablet by mouth twice daily.  DO NOT TAKE IN THE MORNING ON DIALYSIS DAYS 60 tablet 0   cloNIDine (CATAPRES) 0.1 MG tablet Take 1 tablet (0.1 mg total) by mouth at bedtime. (Patient taking differently: Take 0.1 mg by mouth 3 (three) times daily.)     clopidogrel (PLAVIX) 75 MG tablet Take 75 mg by mouth daily.       fenofibrate 160 MG tablet Take 160 mg by mouth daily.  0   fluticasone (FLONASE) 50 MCG/ACT nasal spray Place 1 spray into both nostrils daily.     folic acid (FOLVITE) 1 MG tablet Take 1 mg by mouth daily.     furosemide (LASIX) 20 MG tablet Take 80 mg by mouth 2 (two) times daily. Take 3 tabs twice daily on NON DIALYSIS DAYS      hydrALAZINE (APRESOLINE) 25 MG tablet Take 1 tablet (25 mg total) by mouth 3 (three) times daily. Please make overdue appt with Dr. Harrington Challenger before anymore  refills. 1st attempt 90 tablet 0   insulin lispro (HUMALOG) 100 UNIT/ML KiwkPen Inject into the skin 3 (three) times daily. Give per sliding scale      rosuvastatin (CRESTOR) 20 MG tablet Take 20 mg by mouth daily.  0   TRESIBA FLEXTOUCH 100 UNIT/ML SOPN FlexTouch Pen INJECT 46 UNITS SUBCUTANEOUSLY INTO SKIN ONCE D FOR DIABETES     oxyCODONE-acetaminophen (PERCOCET) 5-325 MG tablet Take 1 tablet by mouth every 6 (six) hours as needed for severe pain. (Patient not taking: Reported on 01/06/2021) 8 tablet 0   No current facility-administered medications for this visit.     PHYSICAL EXAM: Vitals:   01/06/21 1139  BP: (!) 155/75  Pulse: 74  Resp: 18  Temp: 97.9 F (36.6 C)  TempSrc: Temporal  SpO2: 97%  Weight: 250 lb (113.4 kg)    GENERAL: The patient is a well-nourished male, in no acute distress. The vital signs are documented above. Excellent thrill in his upper arm fistula.  He does have a palpable left radial pulse.  Duplex today shows good size maturation of his basilic vein.  MEDICAL ISSUES: Stable status post for stage brachiobasilic fistula creation.  I recommended second stage procedure.  He has had frequent difficulty with his catheter with infections and reports that this  had to be replaced several weeks ago.  We will schedule second stage transposition on 01/12/2021 at [place], MD FACS [facility] Tel [phone]  Note: Portions of this report may have been transcribed using voice recognition software.  Every effort has been made to ensure accuracy; however, inadvertent computerized transcription errors may still be present.

## 2021-01-08 ENCOUNTER — Other Ambulatory Visit: Payer: Self-pay

## 2021-01-08 ENCOUNTER — Encounter (HOSPITAL_COMMUNITY)
Admission: RE | Admit: 2021-01-08 | Discharge: 2021-01-08 | Disposition: A | Discharge status: Home / Self Care | Payer: Medicare Other | Source: Ambulatory Visit | Attending: Vascular Surgery | Admitting: Vascular Surgery

## 2021-01-08 ENCOUNTER — Encounter (HOSPITAL_COMMUNITY): Payer: Self-pay

## 2021-01-08 NOTE — Pre-Procedure Instructions (Signed)
Attempted to call for pre-op assessment. No answer and no voicemail set up.

## 2021-01-08 NOTE — Pre-Procedure Instructions (Signed)
Called patient for pre-op phone call and states he is going to"ride Publishing rights manager home. I asked who will ride with him ans he states, "no one"ibuprofen told him that he could not be put on public transportation without someone with him. He states, "well they have before". I explained that this what not our policy and unless he either had a ride home or someone to ride transportation with him, we would not be able to do his surgery. He states, "well I guess I'll find someone then", but he did not know who.

## 2021-01-12 ENCOUNTER — Encounter (HOSPITAL_COMMUNITY): Payer: Self-pay | Admitting: Vascular Surgery

## 2021-01-12 ENCOUNTER — Encounter (HOSPITAL_COMMUNITY)
Admission: RE | Disposition: A | Discharge status: Home / Self Care | Payer: Self-pay | Source: Home / Self Care | Attending: Vascular Surgery

## 2021-01-12 ENCOUNTER — Ambulatory Visit (HOSPITAL_COMMUNITY)
Admission: RE | Admit: 2021-01-12 | Discharge: 2021-01-12 | Disposition: A | Discharge status: Home / Self Care | Payer: Medicare Other | Attending: Vascular Surgery | Admitting: Vascular Surgery

## 2021-01-12 ENCOUNTER — Ambulatory Visit (HOSPITAL_COMMUNITY): Payer: Medicare Other | Admitting: Anesthesiology

## 2021-01-12 DIAGNOSIS — Z7902 Long term (current) use of antithrombotics/antiplatelets: Secondary | ICD-10-CM | POA: Diagnosis not present

## 2021-01-12 DIAGNOSIS — Z794 Long term (current) use of insulin: Secondary | ICD-10-CM | POA: Insufficient documentation

## 2021-01-12 DIAGNOSIS — Z79899 Other long term (current) drug therapy: Secondary | ICD-10-CM | POA: Diagnosis not present

## 2021-01-12 DIAGNOSIS — Z95810 Presence of automatic (implantable) cardiac defibrillator: Secondary | ICD-10-CM | POA: Diagnosis not present

## 2021-01-12 DIAGNOSIS — Z7982 Long term (current) use of aspirin: Secondary | ICD-10-CM | POA: Diagnosis not present

## 2021-01-12 DIAGNOSIS — N186 End stage renal disease: Secondary | ICD-10-CM | POA: Diagnosis present

## 2021-01-12 HISTORY — PX: BASCILIC VEIN TRANSPOSITION: SHX5742

## 2021-01-12 LAB — GLUCOSE, CAPILLARY: Glucose-Capillary: 139 mg/dL — ABNORMAL HIGH (ref 70–99)

## 2021-01-12 LAB — POCT I-STAT, CHEM 8
BUN: 33 mg/dL — ABNORMAL HIGH (ref 8–23)
Calcium, Ion: 1.19 mmol/L (ref 1.15–1.40)
Chloride: 103 mmol/L (ref 98–111)
Creatinine, Ser: 7.4 mg/dL — ABNORMAL HIGH (ref 0.61–1.24)
Glucose, Bld: 152 mg/dL — ABNORMAL HIGH (ref 70–99)
HCT: 31 % — ABNORMAL LOW (ref 39.0–52.0)
Hemoglobin: 10.5 g/dL — ABNORMAL LOW (ref 13.0–17.0)
Potassium: 4.1 mmol/L (ref 3.5–5.1)
Sodium: 140 mmol/L (ref 135–145)
TCO2: 28 mmol/L (ref 22–32)

## 2021-01-12 SURGERY — TRANSPOSITION, VEIN, BASILIC
Anesthesia: General | Laterality: Left

## 2021-01-12 MED ORDER — HEPARIN SODIUM (PORCINE) 1000 UNIT/ML IJ SOLN
INTRAMUSCULAR | Status: AC
  Filled 2021-01-12: qty 6

## 2021-01-12 MED ORDER — 0.9 % SODIUM CHLORIDE (POUR BTL) OPTIME
TOPICAL | Status: DC | PRN
  Administered 2021-01-12: 1000 mL

## 2021-01-12 MED ORDER — PHENYLEPHRINE HCL-NACL 20-0.9 MG/250ML-% IV SOLN
INTRAVENOUS | Status: DC | PRN
  Administered 2021-01-12: 50 ug/min via INTRAVENOUS

## 2021-01-12 MED ORDER — CHLORHEXIDINE GLUCONATE 0.12 % MT SOLN
15.0000 mL | Freq: Once | OROMUCOSAL | Status: AC
  Administered 2021-01-12: 15 mL via OROMUCOSAL

## 2021-01-12 MED ORDER — ONDANSETRON HCL 4 MG/2ML IJ SOLN
INTRAMUSCULAR | Status: AC
  Filled 2021-01-12: qty 2

## 2021-01-12 MED ORDER — PROPOFOL 10 MG/ML IV BOLUS
INTRAVENOUS | Status: AC
  Filled 2021-01-12: qty 40

## 2021-01-12 MED ORDER — FENTANYL CITRATE (PF) 100 MCG/2ML IJ SOLN
INTRAMUSCULAR | Status: AC
  Filled 2021-01-12: qty 2

## 2021-01-12 MED ORDER — ONDANSETRON HCL 4 MG/2ML IJ SOLN
INTRAMUSCULAR | Status: DC | PRN
Start: 2021-01-12 — End: 2021-01-12
  Administered 2021-01-12: 4 mg via INTRAVENOUS

## 2021-01-12 MED ORDER — FENTANYL CITRATE (PF) 100 MCG/2ML IJ SOLN
INTRAMUSCULAR | Status: DC | PRN
  Administered 2021-01-12: 50 ug via INTRAVENOUS
  Administered 2021-01-12 (×2): 25 ug via INTRAVENOUS

## 2021-01-12 MED ORDER — LIDOCAINE HCL (CARDIAC) PF 100 MG/5ML IV SOSY
PREFILLED_SYRINGE | INTRAVENOUS | Status: DC | PRN
  Administered 2021-01-12: 60 mg via INTRAVENOUS

## 2021-01-12 MED ORDER — PROPOFOL 10 MG/ML IV BOLUS
INTRAVENOUS | Status: DC | PRN
  Administered 2021-01-12: 150 mg via INTRAVENOUS
  Administered 2021-01-12: 30 mg via INTRAVENOUS

## 2021-01-12 MED ORDER — DEXAMETHASONE SODIUM PHOSPHATE 10 MG/ML IJ SOLN
INTRAMUSCULAR | Status: DC | PRN
  Administered 2021-01-12: 4 mg via INTRAVENOUS

## 2021-01-12 MED ORDER — ORAL CARE MOUTH RINSE: 15.0000 mL | Freq: Once | OROMUCOSAL | Status: AC

## 2021-01-12 MED ORDER — LIDOCAINE HCL (PF) 2 % IJ SOLN
INTRAMUSCULAR | Status: AC
  Filled 2021-01-12: qty 5

## 2021-01-12 MED ORDER — MIDAZOLAM HCL 5 MG/5ML IJ SOLN
INTRAMUSCULAR | Status: DC | PRN
  Administered 2021-01-12: 1 mg via INTRAVENOUS

## 2021-01-12 MED ORDER — HEPARIN 6000 UNIT IRRIGATION SOLUTION
Status: DC | PRN
  Administered 2021-01-12: 1

## 2021-01-12 MED ORDER — OXYCODONE-ACETAMINOPHEN 5-325 MG PO TABS
1.0000 | ORAL_TABLET | Freq: Four times a day (QID) | ORAL | 0 refills | Status: DC | PRN
Start: 2021-01-12 — End: 2021-02-20

## 2021-01-12 MED ORDER — SODIUM CHLORIDE 0.9 % IV SOLN: INTRAVENOUS | Status: DC

## 2021-01-12 MED ORDER — DEXAMETHASONE SODIUM PHOSPHATE 10 MG/ML IJ SOLN
INTRAMUSCULAR | Status: AC
  Filled 2021-01-12: qty 1

## 2021-01-12 MED ORDER — ONDANSETRON HCL 4 MG/2ML IJ SOLN: 4.0000 mg | Freq: Once | INTRAMUSCULAR | Status: DC | PRN

## 2021-01-12 MED ORDER — MIDAZOLAM HCL 2 MG/2ML IJ SOLN
INTRAMUSCULAR | Status: AC
  Filled 2021-01-12: qty 2

## 2021-01-12 MED ORDER — CHLORHEXIDINE GLUCONATE 4 % EX LIQD: 60.0000 mL | Freq: Once | CUTANEOUS | Status: DC

## 2021-01-12 MED ORDER — CEFAZOLIN SODIUM-DEXTROSE 2-4 GM/100ML-% IV SOLN
2.0000 g | INTRAVENOUS | Status: AC
  Administered 2021-01-12: 2 g via INTRAVENOUS
  Filled 2021-01-12: qty 100

## 2021-01-12 MED ORDER — SODIUM CHLORIDE 0.9 % IV SOLN
INTRAVENOUS | Status: DC
  Administered 2021-01-12: 1000 mL via INTRAVENOUS

## 2021-01-12 MED ORDER — PHENYLEPHRINE 40 MCG/ML (10ML) SYRINGE FOR IV PUSH (FOR BLOOD PRESSURE SUPPORT)
PREFILLED_SYRINGE | INTRAVENOUS | Status: DC | PRN
  Administered 2021-01-12: 80 ug via INTRAVENOUS
  Administered 2021-01-12: 120 ug via INTRAVENOUS

## 2021-01-12 MED ORDER — PHENYLEPHRINE 40 MCG/ML (10ML) SYRINGE FOR IV PUSH (FOR BLOOD PRESSURE SUPPORT)
PREFILLED_SYRINGE | INTRAVENOUS | Status: AC
  Filled 2021-01-12: qty 10

## 2021-01-12 MED ORDER — FENTANYL CITRATE PF 50 MCG/ML IJ SOSY: 25.0000 ug | PREFILLED_SYRINGE | INTRAMUSCULAR | Status: DC | PRN

## 2021-01-12 MED ORDER — LIDOCAINE-EPINEPHRINE 0.5 %-1:200000 IJ SOLN
INTRAMUSCULAR | Status: AC
  Filled 2021-01-12: qty 1

## 2021-01-12 SURGICAL SUPPLY — 47 items
ADH SKN CLS APL DERMABOND .7 (GAUZE/BANDAGES/DRESSINGS) ×1
APL PRP STRL LF DISP 70% ISPRP (MISCELLANEOUS) ×2
ARMBAND PINK RESTRICT EXTREMIT (MISCELLANEOUS) ×2 IMPLANT
BAG HAMPER (MISCELLANEOUS) ×3 IMPLANT
BNDG ELASTIC 4X5.8 VLCR STR LF (GAUZE/BANDAGES/DRESSINGS) ×1 IMPLANT
CANNULA VESSEL 3MM 2 BLNT TIP (CANNULA) ×2 IMPLANT
CHLORAPREP W/TINT 26 (MISCELLANEOUS) ×2 IMPLANT
CLIP LIGATING EXTRA MED SLVR (CLIP) ×2 IMPLANT
CLIP LIGATING EXTRA SM BLUE (MISCELLANEOUS) ×2 IMPLANT
COVER LIGHT HANDLE STERIS (MISCELLANEOUS) ×2 IMPLANT
COVER MAYO STAND XLG (MISCELLANEOUS) ×2 IMPLANT
DERMABOND ADVANCED (GAUZE/BANDAGES/DRESSINGS) ×1
DERMABOND ADVANCED .7 DNX12 (GAUZE/BANDAGES/DRESSINGS) ×1 IMPLANT
ELECT REM PT RETURN 9FT ADLT (ELECTROSURGICAL) ×2
ELECTRODE REM PT RTRN 9FT ADLT (ELECTROSURGICAL) ×1 IMPLANT
GAUZE 4X4 16PLY ~~LOC~~+RFID DBL (SPONGE) ×1 IMPLANT
GAUZE SPONGE 4X4 12PLY STRL (GAUZE/BANDAGES/DRESSINGS) ×2 IMPLANT
GLOVE SS BIOGEL STRL SZ 7.5 (GLOVE) ×1 IMPLANT
GLOVE SUPERSENSE BIOGEL SZ 7.5 (GLOVE) ×1
GLOVE SURG UNDER POLY LF SZ7 (GLOVE) ×6 IMPLANT
GOWN STRL REUS W/TWL LRG LVL3 (GOWN DISPOSABLE) ×7 IMPLANT
IV NS 500ML (IV SOLUTION) ×2
IV NS 500ML BAXH (IV SOLUTION) ×1 IMPLANT
KIT BLADEGUARD II DBL (SET/KITS/TRAYS/PACK) ×2 IMPLANT
KIT TURNOVER KIT A (KITS) ×2 IMPLANT
MANIFOLD NEPTUNE II (INSTRUMENTS) ×3 IMPLANT
MARKER SKIN DUAL TIP RULER LAB (MISCELLANEOUS) ×4 IMPLANT
NDL HYPO 18GX1.5 BLUNT FILL (NEEDLE) ×2 IMPLANT
NEEDLE HYPO 18GX1.5 BLUNT FILL (NEEDLE) ×4 IMPLANT
NS IRRIG 1000ML POUR BTL (IV SOLUTION) ×2 IMPLANT
PACK CV ACCESS (CUSTOM PROCEDURE TRAY) ×2 IMPLANT
PAD ARMBOARD 7.5X6 YLW CONV (MISCELLANEOUS) ×2 IMPLANT
SET BASIN LINEN APH (SET/KITS/TRAYS/PACK) ×2 IMPLANT
SOL PREP POV-IOD 4OZ 10% (MISCELLANEOUS) ×1 IMPLANT
SOL PREP PROV IODINE SCRUB 4OZ (MISCELLANEOUS) ×1 IMPLANT
SPONGE T-LAP 18X18 ~~LOC~~+RFID (SPONGE) ×2 IMPLANT
SUT PROLENE 6 0 CC (SUTURE) ×2 IMPLANT
SUT SILK 2 0 SH (SUTURE) ×1 IMPLANT
SUT SILK 3 0 (SUTURE) ×2
SUT SILK 3-0 18XBRD TIE 12 (SUTURE) IMPLANT
SUT VIC AB 3-0 SH 27 (SUTURE) ×2
SUT VIC AB 3-0 SH 27X BRD (SUTURE) ×1 IMPLANT
SYR 10ML LL (SYRINGE) ×2 IMPLANT
SYR 50ML LL SCALE MARK (SYRINGE) ×1 IMPLANT
SYR BULB IRRIG 60ML STRL (SYRINGE) ×2 IMPLANT
UNDERPAD 30X36 HEAVY ABSORB (UNDERPADS AND DIAPERS) ×2 IMPLANT
WATER STERILE IRR 1000ML POUR (IV SOLUTION) ×2 IMPLANT

## 2021-01-12 NOTE — Op Note (Signed)
    OPERATIVE REPORT  DATE OF SURGERY: 01/12/2021  PATIENT: [name] Pick., 66 y.o. male MRN: [id number]  DOB: [birth year]  PRE-OPERATIVE DIAGNOSIS: End-stage renal disease  POST-OPERATIVE DIAGNOSIS:  Same  PROCEDURE: Left second stage basilic vein transposition fistula  SURGEON:  Curt Jews, M.D.  PHYSICIAN ASSISTANT: Vevelyn Royals, RN  The assistant was needed for exposure and to expedite the case  ANESTHESIA: LMA  EBL: per anesthesia record  Total I/O In: 500 [I.V.:400; IV Piggyback:100] Out: -   BLOOD ADMINISTERED: none  DRAINS: none  SPECIMEN: none  COUNTS CORRECT:  YES  PATIENT DISPOSITION:  PACU - hemodynamically stable  PROCEDURE DETAILS: Patient was taken everyplace to position with area of the left arm prepped draped in sterile fashion.  SonoSite ultrasound was used to visualize the basilic vein fistula and this was marked on the skin.  An incision was made through the prior antecubital incision was carried down to isolate the brachial artery to basilic vein anastomosis.  Several incisions were made in the medial upper arm relieving skin bridges and the basilic vein was mobilized circumferentially from the antecubital space to the axilla.  Tributary branches were ligated with 3-0 and 4-0 silk ties and divided.  The vein was occluded at the brachial artery anastomosis and the vein was transected.  The vein was brought through the tunnel and was marked to reduce risk of twisting.  A subcutaneous tunnel was created from the level of the antecubital space to the axilla and the vein was brought through the subcutaneous tunnel.  The vein was spatulated and sewn into into itself at the brachial artery anastomosis with a running 6-0 Prolene suture.  Clamps were removed and excellent thrill was noted.  The wounds were irrigated with saline.  Hemostasis obtained electrocautery.  The wounds were closed with 3-0 Vicryl in the subcutaneous and subcuticular tissue.  Sterile  dressing was applied and the patient was transferred to the recovery room in stable condition   [name], M.D., [facility] 01/12/2021 10:34 AM  Note: Portions of this report may have been transcribed using voice recognition software.  Every effort has been made to ensure accuracy; however, inadvertent computerized transcription errors may still be present.

## 2021-01-12 NOTE — Anesthesia Procedure Notes (Signed)
Procedure Name: LMA Insertion Date/Time: 01/12/2021 7:38 AM Performed by: Lieutenant Diego, CRNA Pre-anesthesia Checklist: Patient identified, Emergency Drugs available, Suction available and Patient being monitored Patient Re-evaluated:Patient Re-evaluated prior to induction Oxygen Delivery Method: Circle system utilized Preoxygenation: Pre-oxygenation with 100% oxygen Induction Type: IV induction Ventilation: Mask ventilation without difficulty LMA: LMA inserted LMA Size: 5.0 Number of attempts: 1 Airway Equipment and Method: Bite block Placement Confirmation: positive ETCO2 and breath sounds checked- equal and bilateral Tube secured with: Tape Dental Injury: Teeth and Oropharynx as per pre-operative assessment

## 2021-01-12 NOTE — Interval H&P Note (Signed)
History and Physical Interval Note:  01/12/2021 7:17 AM  [name].  has presented today for surgery, with the diagnosis of End Stage Renal Disease.  The various methods of treatment have been discussed with the patient and family. After consideration of risks, benefits and other options for treatment, the patient has consented to  Procedure(s): LEFT ARM SECOND STAGE BASILIC VEIN TRANSPOSITION (Left) as a surgical intervention.  The patient's history has been reviewed, patient examined, no change in status, stable for surgery.  I have reviewed the patient's chart and labs.  Questions were answered to the patient's satisfaction.     Curt Jews

## 2021-01-12 NOTE — Anesthesia Preprocedure Evaluation (Signed)
Anesthesia Evaluation  Patient identified by MRN, date of birth, ID band Patient awake    Reviewed: Allergy & Precautions, NPO status , Patient's Chart, lab work & pertinent test results, reviewed documented beta blocker date and time   Airway Mallampati: II   Neck ROM: Full    Dental  (+) Dental Advisory Given, Missing   Pulmonary shortness of breath and with exertion, former smoker,    Pulmonary exam normal breath sounds clear to auscultation       Cardiovascular Exercise Tolerance: Poor hypertension, Pt. on medications and Pt. on home beta blockers + Peripheral Vascular Disease, +CHF and + DOE  Normal cardiovascular exam+ Cardiac Defibrillator  Rhythm:Regular Rate:Normal  Left ventricle: The cavity size was moderately dilated. There was moderate concentric hypertrophy. Systolic function was moderately reduced. The estimated ejection fraction was in the range of 35% to 40%. There is akinesis of the basal-midanteroseptal and  inferoseptal myocardium. Features are consistent with a  pseudonormal left ventricular filling pattern, with concomitant abnormal relaxation and increased filling pressure (grade 2 diastolic dysfunction).  - Ventricular septum: Septal motion showed mild paradox. These  changes are consistent with right ventricular pacing.  - Mitral valve: Calcified annulus. Mildly thickened leaflets .  - Left atrium: The atrium was mildly dilated. Anterior-posterior dimension: 43 mm.  - Right ventricle: Pacer wire or catheter noted in right ventricle.    Neuro/Psych CVA (left sided weakness), Residual Symptoms negative psych ROS   GI/Hepatic negative GI ROS, Neg liver ROS,   Endo/Other  diabetes, Well Controlled, Type 2, Oral Hypoglycemic Agents, Insulin Dependent  Renal/GU ESRF and DialysisRenal disease     Musculoskeletal  (+) Arthritis , Osteoarthritis,    Abdominal   Peds  Hematology  (+) anemia ,    Anesthesia Other Findings 27-Oct-2020 07:13:04 [facility] System-AP-300 ROUTINE RECORD [birth year] (65 yr) Male Black Vent. rate 70 BPM PR interval 166 ms QRS duration 92 ms QT/QTcB 410/442 ms P-R-T axes 42 -16 37 Normal sinus rhythm Minimal voltage criteria for LVH, may be normal variant ( R in aVL ) Inferior infarct , age undetermined Abnormal ECG Since last tracing Inferior infarct is now Present Confirmed by End, Harrell Gave [phone]) on 10/27/2020 9:22:57 PM  Reproductive/Obstetrics                            Anesthesia Physical Anesthesia Plan  ASA: 3  Anesthesia Plan: General   Post-op Pain Management:    Induction: Intravenous  PONV Risk Score and Plan: 3 and Ondansetron  Airway Management Planned: Nasal Cannula, Natural Airway, Simple Face Mask and LMA  Additional Equipment:   Intra-op Plan:   Post-operative Plan:   Informed Consent: I have reviewed the patients History and Physical, chart, labs and discussed the procedure including the risks, benefits and alternatives for the proposed anesthesia with the patient or authorized representative who has indicated his/her understanding and acceptance.     Dental advisory given  Plan Discussed with: CRNA and Surgeon  Anesthesia Plan Comments:        Anesthesia Quick Evaluation

## 2021-01-12 NOTE — Anesthesia Postprocedure Evaluation (Signed)
Anesthesia Post Note  Patient: [name].  Procedure(s) Performed: LEFT ARM SECOND STAGE BASILIC VEIN TRANSPOSITION (Left)  Patient location during evaluation: PACU Anesthesia Type: General Level of consciousness: awake and alert and oriented Pain management: pain level controlled Vital Signs Assessment: post-procedure vital signs reviewed and stable Respiratory status: spontaneous breathing and respiratory function stable Cardiovascular status: blood pressure returned to baseline and stable Postop Assessment: no apparent nausea or vomiting Anesthetic complications: no   No notable events documented.   Last Vitals:  Vitals:   01/12/21 1115 01/12/21 1129  BP: 140/62 140/62  Pulse: 77 78  Resp: 17 16  Temp:  37.1 C  SpO2: 93% 95%    Last Pain:  Vitals:   01/12/21 1129  TempSrc: Oral  PainSc: 5                  Graycee Greeson C Wendy Hoback

## 2021-01-12 NOTE — Transfer of Care (Signed)
Immediate Anesthesia Transfer of Care Note  Patient: [name].  Procedure(s) Performed: LEFT ARM SECOND STAGE BASILIC VEIN TRANSPOSITION (Left)  Patient Location: PACU  Anesthesia Type:General  Level of Consciousness: awake  Airway & Oxygen Therapy: Patient Spontanous Breathing and Patient connected to face mask oxygen  Post-op Assessment: Report given to RN and Post -op Vital signs reviewed and stable  Post vital signs: Reviewed and stable  Last Vitals:  Vitals Value Taken Time  BP 151/68 01/12/21 1030  Temp    Pulse 73 01/12/21 1032  Resp 14 01/12/21 1032  SpO2 97 % 01/12/21 1032  Vitals shown include unvalidated device data.  Last Pain:  Vitals:   01/12/21 0649  TempSrc: Oral  PainSc: 0-No pain         Complications: No notable events documented.

## 2021-01-12 NOTE — Discharge Instructions (Signed)
Vascular and Vein Specialists of [facility]  Discharge Instructions  AV Fistula or Graft Surgery for Dialysis Access  Please refer to the following instructions for your post-procedure care. Your surgeon or physician assistant will discuss any changes with you.  Activity  You may drive the day following your surgery, if you are comfortable and no longer taking prescription pain medication. Resume full activity as the soreness in your incision resolves.  Bathing/Showering  You may shower after you go home. Keep your incision dry for 48 hours. Do not soak in a bathtub, hot tub, or swim until the incision heals completely. You may not shower if you have a hemodialysis catheter.  Incision Care  Clean your incision with mild soap and water after 48 hours. Pat the area dry with a clean towel. You do not need a bandage unless otherwise instructed. Do not apply any ointments or creams to your incision. You may have skin glue on your incision. Do not peel it off. It will come off on its own in about one week. Your arm may swell a bit after surgery. To reduce swelling use pillows to elevate your arm so it is above your heart. Your doctor will tell you if you need to lightly wrap your arm with an ACE bandage.  Diet  Resume your normal diet. There are not special food restrictions following this procedure. In order to heal from your surgery, it is CRITICAL to get adequate nutrition. Your body requires vitamins, minerals, and protein. Vegetables are the best source of vitamins and minerals. Vegetables also provide the perfect balance of protein. Processed food has little nutritional value, so try to avoid this.  Medications  Resume taking all of your medications. If your incision is causing pain, you may take over-the counter pain relievers such as acetaminophen (Tylenol). If you were prescribed a stronger pain medication, please be aware these medications can cause nausea and constipation. Prevent  nausea by taking the medication with a snack or meal. Avoid constipation by drinking plenty of fluids and eating foods with high amount of fiber, such as fruits, vegetables, and grains.  Do not take Tylenol if you are taking prescription pain medications.  Follow up Your surgeon may want to see you in the office following your access surgery. If so, this will be arranged at the time of your surgery.  Please call us immediately for any of the following conditions:  Increased pain, redness, drainage (pus) from your incision site Fever of 101 degrees or higher Severe or worsening pain at your incision site Hand pain or numbness.  Reduce your risk of vascular disease:  Stop smoking. If you would like help, call [facility] at 1-800-QUIT-NOW 912-588-9315) or [place] at [place] your cholesterol Maintain a desired weight Control your diabetes Keep your blood pressure down  Dialysis  It will take several weeks to several months for your new dialysis access to be ready for use. Your surgeon will determine when it is okay to use it. Your nephrologist will continue to direct your dialysis. You can continue to use your Permcath until your new access is ready for use.   01/12/2021 [name]. [id number] May 06, 1955  Surgeon(s): Domnique Vanegas, Arvilla Meres, MD  Procedure(s): LEFT ARM SECOND STAGE BASILIC VEIN TRANSPOSITION   May stick graft immediately   May stick graft on designated area only:    Do not stick fistula for 6 weeks    If you have any questions, please call the office  at [phone].

## 2021-01-13 ENCOUNTER — Encounter (HOSPITAL_COMMUNITY): Payer: Self-pay | Admitting: Vascular Surgery

## 2021-01-18 ENCOUNTER — Telehealth: Payer: Self-pay | Admitting: *Deleted

## 2021-01-18 NOTE — Telephone Encounter (Signed)
Ramiro Harvest PA called to report serous drainage coming from left 2nd stage BVT incision. Pt had covered the incision with a band aid and the drainage was leaking out around it. It is hard to gauge how much it is draining due to the lack of absorbent bandage. There is no swelling or signs of infection. Juanda Crumble will reassess the pt on wed and give [place] a call back if there is no improvement.

## 2021-01-20 ENCOUNTER — Telehealth: Payer: Self-pay

## 2021-01-20 ENCOUNTER — Encounter: Payer: Self-pay | Admitting: Vascular Surgery

## 2021-01-20 ENCOUNTER — Ambulatory Visit (INDEPENDENT_AMBULATORY_CARE_PROVIDER_SITE_OTHER): Payer: Medicare Other | Admitting: Vascular Surgery

## 2021-01-20 ENCOUNTER — Other Ambulatory Visit: Payer: Self-pay

## 2021-01-20 VITALS — BP 102/67 | HR 82 | Temp 97.5°F | Resp 16

## 2021-01-20 DIAGNOSIS — N186 End stage renal disease: Secondary | ICD-10-CM

## 2021-01-20 NOTE — Telephone Encounter (Signed)
Patient is s/p second stage BVT on 01/12/2021. Received call from Ramiro Harvest PA that patient is still oozing and bleeding from site. Drainage is serosanguineous. Says he is not running a fever or having chills, but patient recently had 2 catheter associated infections. He is also on plavix and aspirin. Placed patient on Dr. Donnetta Hutching schedule today for evaluation.

## 2021-01-20 NOTE — Progress Notes (Signed)
Vascular and Vein Specialist of [place]  Patient name: [name]. MRN: [id number] DOB: [birth year] Sex: male  REASON FOR VISIT: Evaluation drainage left arm following second stage basilic vein transposition  HPI: [name]. is a 65 y.o. male here today for follow-up.  He underwent outpatient second stage basilic vein transposition by myself on 01/12/2021.  He reports that he has had serous drainage and is here today for evaluation  Current Outpatient Medications  Medication Sig Dispense Refill   acetaminophen (TYLENOL) 500 MG tablet Take 1,000 mg by mouth every 8 (eight) hours as needed for mild pain or headache.      amLODipine (NORVASC) 10 MG tablet Take 10 mg by mouth daily. for high blood pressure  1   aspirin 81 MG tablet Take 81 mg by mouth daily.       calcium acetate (PHOSLO) 667 MG capsule Take 667-1,334 mg by mouth See admin instructions. 2 caps three times daily with meals, and 1 capsule with snacks  0   carvedilol (COREG) 12.5 MG tablet take 1 tablet by mouth twice daily.  DO NOT TAKE IN THE MORNING ON DIALYSIS DAYS 60 tablet 0   cloNIDine (CATAPRES) 0.1 MG tablet Take 1 tablet (0.1 mg total) by mouth at bedtime. (Patient taking differently: Take 0.1 mg by mouth 3 (three) times daily.)     clopidogrel (PLAVIX) 75 MG tablet Take 75 mg by mouth daily.       fenofibrate 160 MG tablet Take 160 mg by mouth daily.  0   fluticasone (FLONASE) 50 MCG/ACT nasal spray Place 1 spray into both nostrils daily.     folic acid (FOLVITE) 1 MG tablet Take 1 mg by mouth daily.     furosemide (LASIX) 20 MG tablet Take 80 mg by mouth 2 (two) times daily. Take 3 tabs twice daily on NON DIALYSIS DAYS      hydrALAZINE (APRESOLINE) 25 MG tablet Take 1 tablet (25 mg total) by mouth 3 (three) times daily. Please make overdue appt with Dr. Harrington Challenger before anymore refills. 1st attempt 90 tablet 0   insulin lispro (HUMALOG) 100 UNIT/ML KiwkPen Inject into the skin  3 (three) times daily. Give per sliding scale      rosuvastatin (CRESTOR) 20 MG tablet Take 20 mg by mouth daily.  0   TRESIBA FLEXTOUCH 100 UNIT/ML SOPN FlexTouch Pen INJECT 46 UNITS SUBCUTANEOUSLY INTO SKIN ONCE D FOR DIABETES     oxyCODONE-acetaminophen (PERCOCET) 5-325 MG tablet Take 1 tablet by mouth every 6 (six) hours as needed for severe pain. (Patient not taking: Reported on 01/20/2021) 15 tablet 0   No current facility-administered medications for this visit.     PHYSICAL EXAM: Vitals:   01/20/21 1401  BP: 102/67  Pulse: 82  Resp: 16  Temp: (!) 97.5 F (36.4 C)  TempSrc: Temporal  SpO2: 98%    GENERAL: The patient is a well-nourished male, in no acute distress. The vital signs are documented above. Palpable radial pulse with no evidence of steal Complete incision healing except for the vein harvest incision immediately above his antecubital space.  This is above the brachiobasilic anastomosis.  There has been serous drainage but no evidence of fluid collection.  MEDICAL ISSUES: I discussed these with [name].  Explained that extensive mobilization for removal of the basilic vein and transposition.  Instructed him on local care of the incision.  There is no evidence of bleeding from his fistula or infection.  He will  see [place] again as scheduled on 02/10/2021 for reevaluation and duplex   Rosetta Posner, MD FACS [facility] of [facility] [phone]  Note: Portions of this report may have been transcribed using voice recognition software.  Every effort has been made to ensure accuracy; however, inadvertent computerized transcription errors may still be present.

## 2021-01-28 ENCOUNTER — Ambulatory Visit: Payer: Medicare Other

## 2021-01-29 LAB — CUP PACEART REMOTE DEVICE CHECK
Battery Remaining Longevity: 30 mo
Battery Remaining Percentage: 33 %
Brady Statistic RV Percent Paced: 0 %
Date Time Interrogation Session: 20221027205300
HighPow Impedance: 46 Ohm
Implantable Lead Implant Date: 20050810
Implantable Lead Location: 753860
Implantable Lead Model: 158
Implantable Lead Serial Number: 153491
Implantable Pulse Generator Implant Date: 20110722
Lead Channel Impedance Value: 486 Ohm
Lead Channel Pacing Threshold Amplitude: 0.7 V
Lead Channel Pacing Threshold Pulse Width: 0.4 ms
Lead Channel Setting Pacing Amplitude: 2.5 V
Lead Channel Setting Pacing Pulse Width: 0.4 ms
Lead Channel Setting Sensing Sensitivity: 0.4 mV
Pulse Gen Serial Number: 269222

## 2021-02-03 ENCOUNTER — Other Ambulatory Visit: Payer: Self-pay

## 2021-02-03 ENCOUNTER — Encounter: Payer: Self-pay | Admitting: Vascular Surgery

## 2021-02-03 ENCOUNTER — Ambulatory Visit (INDEPENDENT_AMBULATORY_CARE_PROVIDER_SITE_OTHER): Payer: Medicare Other | Admitting: Vascular Surgery

## 2021-02-03 ENCOUNTER — Telehealth: Payer: Self-pay

## 2021-02-03 VITALS — BP 161/71 | HR 84 | Temp 97.7°F | Resp 16

## 2021-02-03 DIAGNOSIS — N186 End stage renal disease: Secondary | ICD-10-CM

## 2021-02-03 NOTE — H&P (View-Only) (Signed)
[facility]ascular and Vein Specialist of [place]  Patient name: [name]. MRN: [id number] DOB: 01/04/1956 Sex: male  REASON FOR VISIT: Follow-up left upper arm basilic vein transposition fistula  HPI: [name]. is a 65 y.o. male here today for continued follow-up of his left upper arm basilic vein transposition fistula.  He had presented today to the hemodialysis unit and there was concern regarding fluid draining from his wound.  He was added onto my schedule today.  Mr. Auman also reports that he had malfunctioning of his hemodialysis catheter and could not have access even after placing the PA in the catheter.  He is scheduled for placement of new hemodialysis catheter by interventional radiology at [facility] tomorrow.  Current Outpatient Medications  Medication Sig Dispense Refill   acetaminophen (TYLENOL) 500 MG tablet Take 1,000 mg by mouth every 8 (eight) hours as needed for mild pain or headache.      amLODipine (NORVASC) 10 MG tablet Take 10 mg by mouth daily. for high blood pressure  1   aspirin 81 MG tablet Take 81 mg by mouth daily.       calcium acetate (PHOSLO) 667 MG capsule Take 667-1,334 mg by mouth See admin instructions. 2 caps three times daily with meals, and 1 capsule with snacks  0   carvedilol (COREG) 12.5 MG tablet take 1 tablet by mouth twice daily.  DO NOT TAKE IN THE MORNING ON DIALYSIS DAYS 60 tablet 0   cloNIDine (CATAPRES) 0.1 MG tablet Take 1 tablet (0.1 mg total) by mouth at bedtime. (Patient taking differently: Take 0.1 mg by mouth 3 (three) times daily.)     clopidogrel (PLAVIX) 75 MG tablet Take 75 mg by mouth daily.       fenofibrate 160 MG tablet Take 160 mg by mouth daily.  0   fluticasone (FLONASE) 50 MCG/ACT nasal spray Place 1 spray into both nostrils daily.     folic acid (FOLVITE) 1 MG tablet Take 1 mg by mouth daily.     furosemide (LASIX) 20 MG tablet Take 80 mg by mouth 2 (two) times daily.  Take 3 tabs twice daily on NON DIALYSIS DAYS      hydrALAZINE (APRESOLINE) 25 MG tablet Take 1 tablet (25 mg total) by mouth 3 (three) times daily. Please make overdue appt with Dr. Harrington Challenger before anymore refills. 1st attempt 90 tablet 0   insulin lispro (HUMALOG) 100 UNIT/ML KiwkPen Inject into the skin 3 (three) times daily. Give per sliding scale      oxyCODONE-acetaminophen (PERCOCET) 5-325 MG tablet Take 1 tablet by mouth every 6 (six) hours as needed for severe pain. 15 tablet 0   rosuvastatin (CRESTOR) 20 MG tablet Take 20 mg by mouth daily.  0   TRESIBA FLEXTOUCH 100 UNIT/ML SOPN FlexTouch Pen INJECT 46 UNITS SUBCUTANEOUSLY INTO SKIN ONCE D FOR DIABETES     No current facility-administered medications for this visit.     PHYSICAL EXAM: Vitals:   02/03/21 1318  BP: (!) 161/71  Pulse: 84  Resp: 16  Temp: 97.7 F (36.5 C)  TempSrc: Temporal  SpO2: 95%    GENERAL: The patient is a well-nourished male, in no acute distress. The vital signs are documented above. His upper arm wound actually looks stable.  I saw him in the office on 01/20/2021.  His antecubital incision above the brachiobasilic fistula is completely healed.  He does have separation of approximately 1 cm with over approximately 3 to 4  cm in length of his vein harvest incision above the antecubital space.  There is no fluctuance.  He does have serous drainage from this area.  I debrided this slightly in the office today.  MEDICAL ISSUES: Continued progression of his wound.  I feel that he does have good healing of this.  I packed the wound and we will initiate home health nurse for wet-to-dry packing of his wound as well.  He does not have any evidence of deeper necrotic debris or fluid collection.  I will see him again in 2 weeks for continued follow-up   Rosetta Posner, MD [facility] V[facility] and Vein Specialists of [facility] [phone]  Note: Portions of this report may have been transcribed using voice  recognition software.  Every effort has been made to ensure accuracy; however, inadvertent computerized transcription errors may still be present.

## 2021-02-03 NOTE — Telephone Encounter (Signed)
Ramiro Harvest, PA called to let us know pt's 2nd stage access is "broken open with yellow pus, likely infected". I have added him on to MD schedule today in clinic. Pt is aware, per Juanda Crumble.

## 2021-02-03 NOTE — Progress Notes (Signed)
Vascular and Vein Specialist of [place]  Patient name: [name]. MRN: [id number] DOB: Mar 19, 1956 Sex: male  REASON FOR VISIT: Follow-up left upper arm basilic vein transposition fistula  HPI: [name]. is a 65 y.o. male here today for continued follow-up of his left upper arm basilic vein transposition fistula.  He had presented today to the hemodialysis unit and there was concern regarding fluid draining from his wound.  He was added onto my schedule today.  [name] also reports that he had malfunctioning of his hemodialysis catheter and could not have access even after placing the PA in the catheter.  He is scheduled for placement of new hemodialysis catheter by interventional radiology at [facility] tomorrow.  Current Outpatient Medications  Medication Sig Dispense Refill   acetaminophen (TYLENOL) 500 MG tablet Take 1,000 mg by mouth every 8 (eight) hours as needed for mild pain or headache.      amLODipine (NORVASC) 10 MG tablet Take 10 mg by mouth daily. for high blood pressure  1   aspirin 81 MG tablet Take 81 mg by mouth daily.       calcium acetate (PHOSLO) 667 MG capsule Take 667-1,334 mg by mouth See admin instructions. 2 caps three times daily with meals, and 1 capsule with snacks  0   carvedilol (COREG) 12.5 MG tablet take 1 tablet by mouth twice daily.  DO NOT TAKE IN THE MORNING ON DIALYSIS DAYS 60 tablet 0   cloNIDine (CATAPRES) 0.1 MG tablet Take 1 tablet (0.1 mg total) by mouth at bedtime. (Patient taking differently: Take 0.1 mg by mouth 3 (three) times daily.)     clopidogrel (PLAVIX) 75 MG tablet Take 75 mg by mouth daily.       fenofibrate 160 MG tablet Take 160 mg by mouth daily.  0   fluticasone (FLONASE) 50 MCG/ACT nasal spray Place 1 spray into both nostrils daily.     folic acid (FOLVITE) 1 MG tablet Take 1 mg by mouth daily.     furosemide (LASIX) 20 MG tablet Take 80 mg by mouth 2 (two) times daily.  Take 3 tabs twice daily on NON DIALYSIS DAYS      hydrALAZINE (APRESOLINE) 25 MG tablet Take 1 tablet (25 mg total) by mouth 3 (three) times daily. Please make overdue appt with Dr. Harrington Challenger before anymore refills. 1st attempt 90 tablet 0   insulin lispro (HUMALOG) 100 UNIT/ML KiwkPen Inject into the skin 3 (three) times daily. Give per sliding scale      oxyCODONE-acetaminophen (PERCOCET) 5-325 MG tablet Take 1 tablet by mouth every 6 (six) hours as needed for severe pain. 15 tablet 0   rosuvastatin (CRESTOR) 20 MG tablet Take 20 mg by mouth daily.  0   TRESIBA FLEXTOUCH 100 UNIT/ML SOPN FlexTouch Pen INJECT 46 UNITS SUBCUTANEOUSLY INTO SKIN ONCE D FOR DIABETES     No current facility-administered medications for this visit.     PHYSICAL EXAM: Vitals:   02/03/21 1318  BP: (!) 161/71  Pulse: 84  Resp: 16  Temp: 97.7 F (36.5 C)  TempSrc: Temporal  SpO2: 95%    GENERAL: The patient is a well-nourished male, in no acute distress. The vital signs are documented above. His upper arm wound actually looks stable.  I saw him in the office on 01/20/2021.  His antecubital incision above the brachiobasilic fistula is completely healed.  He does have separation of approximately 1 cm with over approximately 3 to 4  cm in length of his vein harvest incision above the antecubital space.  There is no fluctuance.  He does have serous drainage from this area.  I debrided this slightly in the office today.  MEDICAL ISSUES: Continued progression of his wound.  I feel that he does have good healing of this.  I packed the wound and we will initiate home health nurse for wet-to-dry packing of his wound as well.  He does not have any evidence of deeper necrotic debris or fluid collection.  I will see him again in 2 weeks for continued follow-up   Rosetta Posner, MD [facility] Vascular and Vein Specialists of [facility] [phone]  Note: Portions of this report may have been transcribed using voice  recognition software.  Every effort has been made to ensure accuracy; however, inadvertent computerized transcription errors may still be present.

## 2021-02-10 ENCOUNTER — Encounter: Payer: Medicare Other | Admitting: Vascular Surgery

## 2021-02-12 ENCOUNTER — Other Ambulatory Visit: Payer: Self-pay

## 2021-02-12 DIAGNOSIS — N186 End stage renal disease: Secondary | ICD-10-CM

## 2021-02-17 ENCOUNTER — Ambulatory Visit (INDEPENDENT_AMBULATORY_CARE_PROVIDER_SITE_OTHER): Payer: Medicare Other | Admitting: Vascular Surgery

## 2021-02-17 ENCOUNTER — Other Ambulatory Visit: Payer: Self-pay

## 2021-02-17 ENCOUNTER — Encounter: Payer: Self-pay | Admitting: Vascular Surgery

## 2021-02-17 ENCOUNTER — Encounter (HOSPITAL_COMMUNITY): Payer: Self-pay | Admitting: Surgery

## 2021-02-17 VITALS — BP 146/76 | HR 80 | Temp 97.7°F | Resp 18 | Ht 72.0 in | Wt 250.0 lb

## 2021-02-17 DIAGNOSIS — N186 End stage renal disease: Secondary | ICD-10-CM

## 2021-02-17 NOTE — Progress Notes (Signed)
Joey from [employer] suggests a Magnet be used if needed.

## 2021-02-17 NOTE — Anesthesia Preprocedure Evaluation (Addendum)
Anesthesia Evaluation  Patient identified by MRN, date of birth, ID band Patient awake    Reviewed: Allergy & Precautions, H&P , NPO status , Patient's Chart, lab work & pertinent test results  Airway Mallampati: II  TM Distance: >3 FB Neck ROM: Full    Dental no notable dental hx.    Pulmonary neg pulmonary ROS, former smoker,    Pulmonary exam normal breath sounds clear to auscultation       Cardiovascular hypertension, + Peripheral Vascular Disease and +CHF  Normal cardiovascular exam+ Cardiac Defibrillator  Rhythm:Regular Rate:Normal  Echo 06/19/16:  - Left ventricle: The cavity size was moderately dilated. There was moderate concentric hypertrophy. Systolic function was moderately reduced. The estimated ejection fraction was in the range of 35% to 40%. There is akinesis of the basal-midanteroseptal and inferoseptal myocardium. Features are consistent with a pseudonormal left ventricular filling pattern, with concomitant abnormal relaxation and increased filling pressure (grade 2 diastolic dysfunction).  - Ventricular septum: Septal motion showed mild paradox. These changes are consistent with right ventricular pacing.  - Mitral valve: Calcified annulus. Mildly thickened leaflets .    Neuro/Psych CVA negative psych ROS   GI/Hepatic negative GI ROS, Neg liver ROS,   Endo/Other  diabetes  Renal/GU DialysisRenal disease  negative genitourinary   Musculoskeletal negative musculoskeletal ROS (+)   Abdominal   Peds negative pediatric ROS (+)  Hematology  (+) anemia ,   Anesthesia Other Findings   Reproductive/Obstetrics negative OB ROS                            Anesthesia Physical Anesthesia Plan  ASA: 4  Anesthesia Plan: General   Post-op Pain Management:    Induction: Intravenous  PONV Risk Score and Plan: 2 and Ondansetron, Dexamethasone and Treatment may vary due to age or medical  condition  Airway Management Planned: LMA  Additional Equipment:   Intra-op Plan:   Post-operative Plan: Extubation in OR  Informed Consent: I have reviewed the patients History and Physical, chart, labs and discussed the procedure including the risks, benefits and alternatives for the proposed anesthesia with the patient or authorized representative who has indicated his/her understanding and acceptance.     Dental advisory given  Plan Discussed with: CRNA and Surgeon  Anesthesia Plan Comments: (See APP note by Durel Salts, FNP  Magnet over AICD for case)      Anesthesia Quick Evaluation

## 2021-02-17 NOTE — Progress Notes (Signed)
Anesthesia Chart Review:  Pt is a same day work up   Case: [id number] Date/Time: 02/18/21 0955   Procedures:      LEFT ARM DEBRIDEMENT (Left)     LIGATION OF LEFT ARM FISTULA (Left)   Anesthesia type: Choice   Pre-op diagnosis: Left arm wound   Location: [facility] OR ROOM 64 / [place] OR   Surgeons: Serafina Mitchell, MD       DISCUSSION: Pt is 65 years old with hx NICM, CHF, AICD ([employer] implanted 10/23/09, last remote device check 01/28/21) HTN, TIAs, PVD, DM, ESRD on hemodialysis. S/p B BKA  Pt to stop plavix 02/17/21  Perioperative prescription for ICD pending.    PROVIDERS: - PCP is [name], MD - EP cardiologist is Cristopher Peru, MD. Last office visit 07/09/19  LABS: Will be obtained day of surgery    EKG 10/27/20: NSR. Minimal voltage criteria for LVH, may be normal variant ( R in aVL ). Inferior infarct , age undetermined   CV: Echo 06/19/16:  - Left ventricle: The cavity size was moderately dilated. There was moderate concentric hypertrophy. Systolic function was moderately reduced. The estimated ejection fraction was in the range of 35% to 40%. There is akinesis of the basal-midanteroseptal and inferoseptal myocardium. Features are consistent with a pseudonormal left ventricular filling pattern, with concomitant abnormal relaxation and increased filling pressure (grade 2 diastolic dysfunction).  - Ventricular septum: Septal motion showed mild paradox. These changes are consistent with right ventricular pacing.  - Mitral valve: Calcified annulus. Mildly thickened leaflets .  - Left atrium: The atrium was mildly dilated. Anterior-posterior dimension: 43 mm.  - Right ventricle: Pacer wire or catheter noted in right ventricle.    Past Medical History:  Diagnosis Date   AICD (automatic cardioverter/defibrillator) present    Anemia    Arthritis    Cerebrovascular disease    CHF (congestive heart failure) (HCC)    Chronic kidney disease    ESRD, MWF HD   Diabetes mellitus     type II   History of TIAs    Hyperlipidemia    Hypertension    Nonischemic cardiomyopathy (HCC)    PVD (peripheral vascular disease) (HCC)    s/p B BKA   Shortness of breath dyspnea    with exertion    Skin disease    Rare   Stroke ([place])    "light stroke"   Tobacco abuse     Past Surgical History:  Procedure Laterality Date   A/V FISTULAGRAM Left 06/24/2016   Procedure: A/V Fistulagram;  Surgeon: Elam Dutch, MD;  Location: [place] [facility];  Service: Cardiovascular;  Laterality: Left;   A/V FISTULAGRAM N/A 01/17/2017   Procedure: A/V Fistulagram - left arm;  Surgeon: Serafina Mitchell, MD;  Location: [place] [facility];  Service: Cardiovascular;  Laterality: N/A;   AV FISTULA PLACEMENT Left 04/08/2015   Procedure: Creation of Left arm BRACHIOCEPHALIC ARTERIOVENOUS  FISTULA ;  Surgeon: Mal Misty, MD;  Location: [place];  Service: Vascular;  Laterality: Left;   AV FISTULA PLACEMENT Left 10/27/2020   Procedure: LEFT ARM ARTERIOVENOUS (AV) FISTULA CREATION;  Surgeon: Rosetta Posner, MD;  Location: [facility];  Service: Vascular;  Laterality: Left;   [place] Left 01/12/2021   Procedure: LEFT ARM SECOND STAGE BASILIC VEIN TRANSPOSITION;  Surgeon: Rosetta Posner, MD;  Location: [facility];  Service: Vascular;  Laterality: Left;   [place]  CATARACT EXTRACTION W/PHACO Right 03/17/2015   Procedure: CATARACT EXTRACTION PHACO AND INTRAOCULAR LENS PLACEMENT (IOC);  Surgeon: Rutherford Guys, MD;  Location: [facility];  Service: Ophthalmology;  Laterality: Right;  CDE: 6.59   EYE SURGERY Right    Cataract   LEG AMPUTATION BELOW KNEE     bilateral   PERIPHERAL VASCULAR INTERVENTION  01/17/2017   Procedure: PERIPHERAL VASCULAR INTERVENTION;  Surgeon: Serafina Mitchell, MD;  Location: [place] [facility];  Service: Cardiovascular;;  PTA  left arm fistula    MEDICATIONS: No current facility-administered medications for  this encounter.    acetaminophen (TYLENOL) 500 MG tablet   amLODipine (NORVASC) 10 MG tablet   aspirin 81 MG tablet   calcium acetate (PHOSLO) 667 MG capsule   carvedilol (COREG) 12.5 MG tablet   cloNIDine (CATAPRES) 0.1 MG tablet   clopidogrel (PLAVIX) 75 MG tablet   fenofibrate 160 MG tablet   fluticasone (FLONASE) 50 MCG/ACT nasal spray   folic acid (FOLVITE) 1 MG tablet   furosemide (LASIX) 20 MG tablet   hydrALAZINE (APRESOLINE) 25 MG tablet   insulin lispro (HUMALOG) 100 UNIT/ML KiwkPen   oxyCODONE-acetaminophen (PERCOCET) 5-325 MG tablet   rosuvastatin (CRESTOR) 20 MG tablet   TRESIBA FLEXTOUCH 100 UNIT/ML SOPN FlexTouch Pen   - Pt to stop plavix 02/17/21   If labs acceptable day of surgery, I anticipate pt can proceed with surgery as scheduled.  Willeen Cass, PhD, FNP-BC [facility] Short Stay Surgical Center/Anesthesiology Phone: [phone] 02/17/2021 4:01 PM

## 2021-02-17 NOTE — H&P (View-Only) (Signed)
Vascular and Vein Specialist of [place]  Patient name: [name]. MRN: [id number] DOB: 05-Jun-1955 Sex: male  REASON FOR VISIT: Follow-up left arm AV fistula wound  HPI: [name]. is a 65 y.o. male here today for follow-up.  He had second stage basilic vein transposition on 01/12/2021.  My last visit with him was 2 weeks ago.  At that time he had had breakdown of his more distal incision above the antecubital space.  The area of the AV fistula anastomosis was not involved.  He has been seen by home health.  He presents today for follow-up  Current Outpatient Medications  Medication Sig Dispense Refill   acetaminophen (TYLENOL) 500 MG tablet Take 1,000 mg by mouth every 8 (eight) hours as needed for mild pain or headache.      amLODipine (NORVASC) 10 MG tablet Take 10 mg by mouth daily. for high blood pressure  1   aspirin 81 MG tablet Take 81 mg by mouth daily.       calcium acetate (PHOSLO) 667 MG capsule Take 667-1,334 mg by mouth See admin instructions. 2 caps three times daily with meals, and 1 capsule with snacks  0   carvedilol (COREG) 12.5 MG tablet take 1 tablet by mouth twice daily.  DO NOT TAKE IN THE MORNING ON DIALYSIS DAYS 60 tablet 0   cloNIDine (CATAPRES) 0.1 MG tablet Take 1 tablet (0.1 mg total) by mouth at bedtime. (Patient taking differently: Take 0.1 mg by mouth 3 (three) times daily.)     clopidogrel (PLAVIX) 75 MG tablet Take 75 mg by mouth daily.       fenofibrate 160 MG tablet Take 160 mg by mouth daily.  0   fluticasone (FLONASE) 50 MCG/ACT nasal spray Place 1 spray into both nostrils daily.     folic acid (FOLVITE) 1 MG tablet Take 1 mg by mouth daily.     furosemide (LASIX) 20 MG tablet Take 80 mg by mouth 2 (two) times daily. Take 3 tabs twice daily on NON DIALYSIS DAYS      hydrALAZINE (APRESOLINE) 25 MG tablet Take 1 tablet (25 mg total) by mouth 3 (three) times daily. Please make overdue appt with Dr. Harrington Challenger  before anymore refills. 1st attempt 90 tablet 0   insulin lispro (HUMALOG) 100 UNIT/ML KiwkPen Inject into the skin 3 (three) times daily. Give per sliding scale      oxyCODONE-acetaminophen (PERCOCET) 5-325 MG tablet Take 1 tablet by mouth every 6 (six) hours as needed for severe pain. 15 tablet 0   rosuvastatin (CRESTOR) 20 MG tablet Take 20 mg by mouth daily.  0   TRESIBA FLEXTOUCH 100 UNIT/ML SOPN FlexTouch Pen INJECT 46 UNITS SUBCUTANEOUSLY INTO SKIN ONCE D FOR DIABETES     No current facility-administered medications for this visit.     PHYSICAL EXAM: Vitals:   02/17/21 0907  BP: (!) 146/76  Pulse: 80  Resp: 18  Temp: 97.7 F (36.5 C)  TempSrc: Temporal  SpO2: 93%  Weight: 250 lb (113.4 kg)  Height: 6' (1.829 m)    GENERAL: The patient is a well-nourished male, in no acute distress. The vital signs are documented above. He has had marked progression of his wound in the last 2 weeks.  Now his upper incision has also separated with some granulation tissue present.  Also of concern it looks as though he has infarcted the area between the upper and lower incisions.  There is not a firm eschar  but it does look as though it is evolving to that level.  His fistula is patent with an excellent thrill.  It is away from the wounds but certainly in close proximity   MEDICAL ISSUES: Continued breakdown of subcutaneous tissue at the vein harvest site for second stage basilic vein transposition done on 01/12/2021.  I feel that he needs to be taken to the operating room for debridement.  I also explained that he may have involvement of the basilic vein fistula and this would require probable ligation of his fistula.  I am not able to get this on the schedule at [facility] for another 6 days.  I am concerned regarding the proximity of the fistula and have recommended to him that this be taken care of by my partners at [facility].  He has dialysis today.  We will plan outpatient  surgery with one of my partners tomorrow at [facility].  I can continue to follow him from a wound standpoint in our [place] office.   Rosetta Posner, MD FACS Vascular and Vein Specialists of [facility] [phone]  Note: Portions of this report may have been transcribed using voice recognition software.  Every effort has been made to ensure accuracy; however, inadvertent computerized transcription errors may still be present.

## 2021-02-17 NOTE — Progress Notes (Signed)
PCP - Dr. Georges Mouse  Cardiologist - Dr. Beckie Salts  EP- Dr. Beckie Salts- Unable to obtain device orders due to the pt missing previous appts, per his office.  Endocrine- Denies  Pulm-Denies  Chest x-ray - Denies  EKG - 10/27/20 (E)  Stress Test - Denies  ECHO - 06/19/16 (E)  Cardiac Cath - Denies  AICD- [employer]- Notified  PM-na LOOP-na  Dialysis- M-W-F  Sleep Study - Denies CPAP - Denies  LABS- 02/18/21: I-Stat-8  ASA- Continue Plavix- LD-11/15  ERAS- No  HA1C- 10/21/16 (E): 6.6 Fasting Blood Sugar - n/a Checks Blood Sugar __0___ times a day, Pt states he is waiting for a new meter, and he has not checked his bs in a "while".  Anesthesia- Yes- cardiac history  Pt denies having chest pain, sob, or fever during the pre-op phone call. All instructions explained to the pt, with a verbal understanding of the material including: as of today, stop taking all Aspirin (unless instructed by your doctor) and Other Aspirin containing products, Vitamins, Fish oils, and Herbal medications. Also stop all NSAIDS i.e. Advil, Ibuprofen, Motrin, Aleve, Anaprox, Naproxen, BC, Goody Powders, and all Supplements. Pt also instructed to wear a mask and social distance while he is out. The opportunity to ask questions was provided.    Coronavirus Screening  Have you experienced the following symptoms:  Cough yes/no: No Fever (>100.48F)  yes/no: No Runny nose yes/no: No Sore throat yes/no: No Difficulty breathing/shortness of breath  yes/no: No  Have you or a family member traveled in the last 14 days and where? yes/no: No   If the patient indicates "YES" to the above questions, their PAT will be rescheduled to limit the exposure to others and, the surgeon will be notified. THE PATIENT WILL NEED TO BE ASYMPTOMATIC FOR 14 DAYS.   If the patient is not experiencing any of these symptoms, the PAT nurse will instruct them to NOT bring anyone with them to their appointment since they may  have these symptoms or traveled as well.   Please remind your patients and families that hospital visitation restrictions are in effect and the importance of the restrictions.

## 2021-02-17 NOTE — Progress Notes (Signed)
Vascular and Vein Specialist of [place]  Patient name: [name]. MRN: [id number] DOB: 1956/04/03 Sex: male  REASON FOR VISIT: Follow-up left arm AV fistula wound  HPI: [name]. is a 65 y.o. male here today for follow-up.  He had second stage basilic vein transposition on 01/12/2021.  My last visit with him was 2 weeks ago.  At that time he had had breakdown of his more distal incision above the antecubital space.  The area of the AV fistula anastomosis was not involved.  He has been seen by home health.  He presents today for follow-up  Current Outpatient Medications  Medication Sig Dispense Refill   acetaminophen (TYLENOL) 500 MG tablet Take 1,000 mg by mouth every 8 (eight) hours as needed for mild pain or headache.      amLODipine (NORVASC) 10 MG tablet Take 10 mg by mouth daily. for high blood pressure  1   aspirin 81 MG tablet Take 81 mg by mouth daily.       calcium acetate (PHOSLO) 667 MG capsule Take 667-1,334 mg by mouth See admin instructions. 2 caps three times daily with meals, and 1 capsule with snacks  0   carvedilol (COREG) 12.5 MG tablet take 1 tablet by mouth twice daily.  DO NOT TAKE IN THE MORNING ON DIALYSIS DAYS 60 tablet 0   cloNIDine (CATAPRES) 0.1 MG tablet Take 1 tablet (0.1 mg total) by mouth at bedtime. (Patient taking differently: Take 0.1 mg by mouth 3 (three) times daily.)     clopidogrel (PLAVIX) 75 MG tablet Take 75 mg by mouth daily.       fenofibrate 160 MG tablet Take 160 mg by mouth daily.  0   fluticasone (FLONASE) 50 MCG/ACT nasal spray Place 1 spray into both nostrils daily.     folic acid (FOLVITE) 1 MG tablet Take 1 mg by mouth daily.     furosemide (LASIX) 20 MG tablet Take 80 mg by mouth 2 (two) times daily. Take 3 tabs twice daily on NON DIALYSIS DAYS      hydrALAZINE (APRESOLINE) 25 MG tablet Take 1 tablet (25 mg total) by mouth 3 (three) times daily. Please make overdue appt with Dr. Harrington Challenger  before anymore refills. 1st attempt 90 tablet 0   insulin lispro (HUMALOG) 100 UNIT/ML KiwkPen Inject into the skin 3 (three) times daily. Give per sliding scale      oxyCODONE-acetaminophen (PERCOCET) 5-325 MG tablet Take 1 tablet by mouth every 6 (six) hours as needed for severe pain. 15 tablet 0   rosuvastatin (CRESTOR) 20 MG tablet Take 20 mg by mouth daily.  0   TRESIBA FLEXTOUCH 100 UNIT/ML SOPN FlexTouch Pen INJECT 46 UNITS SUBCUTANEOUSLY INTO SKIN ONCE D FOR DIABETES     No current facility-administered medications for this visit.     PHYSICAL EXAM: Vitals:   02/17/21 0907  BP: (!) 146/76  Pulse: 80  Resp: 18  Temp: 97.7 F (36.5 C)  TempSrc: Temporal  SpO2: 93%  Weight: 250 lb (113.4 kg)  Height: 6' (1.829 m)    GENERAL: The patient is a well-nourished male, in no acute distress. The vital signs are documented above. He has had marked progression of his wound in the last 2 weeks.  Now his upper incision has also separated with some granulation tissue present.  Also of concern it looks as though he has infarcted the area between the upper and lower incisions.  There is not a firm eschar  but it does look as though it is evolving to that level.  His fistula is patent with an excellent thrill.  It is away from the wounds but certainly in close proximity   MEDICAL ISSUES: Continued breakdown of subcutaneous tissue at the vein harvest site for second stage basilic vein transposition done on 01/12/2021.  I feel that he needs to be taken to the operating room for debridement.  I also explained that he may have involvement of the basilic vein fistula and this would require probable ligation of his fistula.  I am not able to get this on the schedule at [facility] for another 6 days.  I am concerned regarding the proximity of the fistula and have recommended to him that this be taken care of by my partners at [facility].  He has dialysis today.  We will plan outpatient  surgery with one of my partners tomorrow at [facility].  I can continue to follow him from a wound standpoint in our [place] office.   Rosetta Posner, MD FACS Vascular and Vein Specialists of [facility] [phone]  Note: Portions of this report may have been transcribed using voice recognition software.  Every effort has been made to ensure accuracy; however, inadvertent computerized transcription errors may still be present.

## 2021-02-18 ENCOUNTER — Other Ambulatory Visit: Payer: Self-pay

## 2021-02-18 ENCOUNTER — Inpatient Hospital Stay (HOSPITAL_COMMUNITY)
Admission: RE | Admit: 2021-02-18 | Discharge: 2021-02-20 | DRG: 901 | Disposition: A | Discharge status: Home Health | Payer: Medicare Other | Attending: Surgery | Admitting: Surgery

## 2021-02-18 ENCOUNTER — Ambulatory Visit (HOSPITAL_COMMUNITY): Payer: Medicare Other | Admitting: Emergency Medicine

## 2021-02-18 ENCOUNTER — Encounter (HOSPITAL_COMMUNITY)
Admission: RE | Disposition: A | Discharge status: Home Health | Payer: Self-pay | Source: Home / Self Care | Attending: Surgery

## 2021-02-18 ENCOUNTER — Encounter (HOSPITAL_COMMUNITY): Payer: Self-pay | Admitting: Surgery

## 2021-02-18 DIAGNOSIS — Z91041 Radiographic dye allergy status: Secondary | ICD-10-CM

## 2021-02-18 DIAGNOSIS — M898X9 Other specified disorders of bone, unspecified site: Secondary | ICD-10-CM | POA: Diagnosis present

## 2021-02-18 DIAGNOSIS — Z20822 Contact with and (suspected) exposure to covid-19: Secondary | ICD-10-CM | POA: Diagnosis present

## 2021-02-18 DIAGNOSIS — T8130XA Disruption of wound, unspecified, initial encounter: Secondary | ICD-10-CM | POA: Diagnosis present

## 2021-02-18 DIAGNOSIS — Z7902 Long term (current) use of antithrombotics/antiplatelets: Secondary | ICD-10-CM

## 2021-02-18 DIAGNOSIS — Z8673 Personal history of transient ischemic attack (TIA), and cerebral infarction without residual deficits: Secondary | ICD-10-CM

## 2021-02-18 DIAGNOSIS — Z992 Dependence on renal dialysis: Secondary | ICD-10-CM

## 2021-02-18 DIAGNOSIS — Z87891 Personal history of nicotine dependence: Secondary | ICD-10-CM

## 2021-02-18 DIAGNOSIS — Z833 Family history of diabetes mellitus: Secondary | ICD-10-CM

## 2021-02-18 DIAGNOSIS — Z79899 Other long term (current) drug therapy: Secondary | ICD-10-CM

## 2021-02-18 DIAGNOSIS — Z8249 Family history of ischemic heart disease and other diseases of the circulatory system: Secondary | ICD-10-CM

## 2021-02-18 DIAGNOSIS — M1990 Unspecified osteoarthritis, unspecified site: Secondary | ICD-10-CM | POA: Diagnosis present

## 2021-02-18 DIAGNOSIS — I5042 Chronic combined systolic (congestive) and diastolic (congestive) heart failure: Secondary | ICD-10-CM | POA: Diagnosis present

## 2021-02-18 DIAGNOSIS — Z794 Long term (current) use of insulin: Secondary | ICD-10-CM

## 2021-02-18 DIAGNOSIS — I428 Other cardiomyopathies: Secondary | ICD-10-CM | POA: Diagnosis present

## 2021-02-18 DIAGNOSIS — Z95810 Presence of automatic (implantable) cardiac defibrillator: Secondary | ICD-10-CM

## 2021-02-18 DIAGNOSIS — Z89519 Acquired absence of unspecified leg below knee: Secondary | ICD-10-CM

## 2021-02-18 DIAGNOSIS — I132 Hypertensive heart and chronic kidney disease with heart failure and with stage 5 chronic kidney disease, or end stage renal disease: Secondary | ICD-10-CM | POA: Diagnosis present

## 2021-02-18 DIAGNOSIS — T8131XA Disruption of external operation (surgical) wound, not elsewhere classified, initial encounter: Principal | ICD-10-CM | POA: Diagnosis present

## 2021-02-18 DIAGNOSIS — Z888 Allergy status to other drugs, medicaments and biological substances status: Secondary | ICD-10-CM

## 2021-02-18 DIAGNOSIS — Z823 Family history of stroke: Secondary | ICD-10-CM

## 2021-02-18 DIAGNOSIS — E1122 Type 2 diabetes mellitus with diabetic chronic kidney disease: Secondary | ICD-10-CM | POA: Diagnosis present

## 2021-02-18 DIAGNOSIS — E1152 Type 2 diabetes mellitus with diabetic peripheral angiopathy with gangrene: Secondary | ICD-10-CM | POA: Diagnosis present

## 2021-02-18 DIAGNOSIS — T82898A Other specified complication of vascular prosthetic devices, implants and grafts, initial encounter: Secondary | ICD-10-CM | POA: Diagnosis not present

## 2021-02-18 DIAGNOSIS — Y718 Miscellaneous cardiovascular devices associated with adverse incidents, not elsewhere classified: Secondary | ICD-10-CM | POA: Diagnosis present

## 2021-02-18 DIAGNOSIS — T827XXA Infection and inflammatory reaction due to other cardiac and vascular devices, implants and grafts, initial encounter: Secondary | ICD-10-CM | POA: Diagnosis not present

## 2021-02-18 DIAGNOSIS — E785 Hyperlipidemia, unspecified: Secondary | ICD-10-CM | POA: Diagnosis present

## 2021-02-18 DIAGNOSIS — N186 End stage renal disease: Principal | ICD-10-CM | POA: Diagnosis present

## 2021-02-18 DIAGNOSIS — Z7982 Long term (current) use of aspirin: Secondary | ICD-10-CM

## 2021-02-18 HISTORY — PX: WOUND DEBRIDEMENT: SHX247

## 2021-02-18 LAB — PROTIME-INR
INR: 1.3 — ABNORMAL HIGH (ref 0.8–1.2)
Prothrombin Time: 15.9 seconds — ABNORMAL HIGH (ref 11.4–15.2)

## 2021-02-18 LAB — CBC
HCT: 27.7 % — ABNORMAL LOW (ref 39.0–52.0)
Hemoglobin: 8.5 g/dL — ABNORMAL LOW (ref 13.0–17.0)
MCH: 30.5 pg (ref 26.0–34.0)
MCHC: 30.7 g/dL (ref 30.0–36.0)
MCV: 99.3 fL (ref 80.0–100.0)
Platelets: 225 10*3/uL (ref 150–400)
RBC: 2.79 MIL/uL — ABNORMAL LOW (ref 4.22–5.81)
RDW: 13.1 % (ref 11.5–15.5)
WBC: 9.6 10*3/uL (ref 4.0–10.5)
nRBC: 0 % (ref 0.0–0.2)

## 2021-02-18 LAB — POCT I-STAT, CHEM 8
BUN: 26 mg/dL — ABNORMAL HIGH (ref 8–23)
Calcium, Ion: 1.14 mmol/L — ABNORMAL LOW (ref 1.15–1.40)
Chloride: 100 mmol/L (ref 98–111)
Creatinine, Ser: 5.9 mg/dL — ABNORMAL HIGH (ref 0.61–1.24)
Glucose, Bld: 111 mg/dL — ABNORMAL HIGH (ref 70–99)
HCT: 33 % — ABNORMAL LOW (ref 39.0–52.0)
Hemoglobin: 11.2 g/dL — ABNORMAL LOW (ref 13.0–17.0)
Potassium: 3.9 mmol/L (ref 3.5–5.1)
Sodium: 138 mmol/L (ref 135–145)
TCO2: 28 mmol/L (ref 22–32)

## 2021-02-18 LAB — HIV ANTIBODY (ROUTINE TESTING W REFLEX): HIV Screen 4th Generation wRfx: NONREACTIVE

## 2021-02-18 LAB — GLUCOSE, CAPILLARY
Glucose-Capillary: 106 mg/dL — ABNORMAL HIGH (ref 70–99)
Glucose-Capillary: 102 mg/dL — ABNORMAL HIGH (ref 70–99)
Glucose-Capillary: 137 mg/dL — ABNORMAL HIGH (ref 70–99)
Glucose-Capillary: 179 mg/dL — ABNORMAL HIGH (ref 70–99)
Glucose-Capillary: 311 mg/dL — ABNORMAL HIGH (ref 70–99)

## 2021-02-18 LAB — COMPREHENSIVE METABOLIC PANEL
ALT: 7 U/L (ref 0–44)
AST: 12 U/L — ABNORMAL LOW (ref 15–41)
Albumin: 3 g/dL — ABNORMAL LOW (ref 3.5–5.0)
Alkaline Phosphatase: 61 U/L (ref 38–126)
Anion gap: 13 (ref 5–15)
BUN: 29 mg/dL — ABNORMAL HIGH (ref 8–23)
CO2: 24 mmol/L (ref 22–32)
Calcium: 8.6 mg/dL — ABNORMAL LOW (ref 8.9–10.3)
Chloride: 97 mmol/L — ABNORMAL LOW (ref 98–111)
Creatinine, Ser: 5.79 mg/dL — ABNORMAL HIGH (ref 0.61–1.24)
GFR, Estimated: 10 mL/min — ABNORMAL LOW (ref >60)
Glucose, Bld: 156 mg/dL — ABNORMAL HIGH (ref 70–99)
Potassium: 4.3 mmol/L (ref 3.5–5.1)
Sodium: 134 mmol/L — ABNORMAL LOW (ref 135–145)
Total Bilirubin: 0.7 mg/dL (ref 0.3–1.2)
Total Protein: 6.2 g/dL — ABNORMAL LOW (ref 6.5–8.1)

## 2021-02-18 LAB — HEMOGLOBIN A1C
Hgb A1c MFr Bld: 6.2 % — ABNORMAL HIGH (ref 4.8–5.6)
Mean Plasma Glucose: 131.24 mg/dL

## 2021-02-18 LAB — PREPARE RBC (CROSSMATCH)

## 2021-02-18 SURGERY — DEBRIDEMENT, WOUND
Anesthesia: General | Site: Arm Upper | Laterality: Left

## 2021-02-18 MED ORDER — FOLIC ACID 1 MG PO TABS
1.0000 mg | ORAL_TABLET | Freq: Every day | ORAL | Status: DC
  Administered 2021-02-19 – 2021-02-20 (×2): 1 mg via ORAL
  Filled 2021-02-18 (×2): qty 1

## 2021-02-18 MED ORDER — ONDANSETRON HCL 4 MG/2ML IJ SOLN: 4.0000 mg | Freq: Once | INTRAMUSCULAR | Status: DC | PRN

## 2021-02-18 MED ORDER — FENTANYL CITRATE (PF) 250 MCG/5ML IJ SOLN
INTRAMUSCULAR | Status: AC
  Filled 2021-02-18: qty 5

## 2021-02-18 MED ORDER — CHLORHEXIDINE GLUCONATE 4 % EX LIQD: 60.0000 mL | Freq: Once | CUTANEOUS | Status: DC

## 2021-02-18 MED ORDER — SODIUM CHLORIDE 0.9% IV SOLUTION: Freq: Once | INTRAVENOUS | Status: DC

## 2021-02-18 MED ORDER — ALUM & MAG HYDROXIDE-SIMETH 200-200-20 MG/5ML PO SUSP: 15.0000 mL | ORAL | Status: DC | PRN

## 2021-02-18 MED ORDER — CHLORHEXIDINE GLUCONATE 0.12 % MT SOLN
15.0000 mL | Freq: Once | OROMUCOSAL | Status: AC
  Administered 2021-02-18: 08:00:00 15 mL via OROMUCOSAL
  Filled 2021-02-18: qty 15

## 2021-02-18 MED ORDER — HYDRALAZINE HCL 25 MG PO TABS
25.0000 mg | ORAL_TABLET | Freq: Three times a day (TID) | ORAL | Status: DC
  Administered 2021-02-18 – 2021-02-20 (×4): 25 mg via ORAL
  Filled 2021-02-18 (×4): qty 1

## 2021-02-18 MED ORDER — ACETAMINOPHEN 10 MG/ML IV SOLN: 1000.0000 mg | Freq: Once | INTRAVENOUS | Status: DC | PRN

## 2021-02-18 MED ORDER — FENTANYL CITRATE (PF) 100 MCG/2ML IJ SOLN
INTRAMUSCULAR | Status: AC
  Filled 2021-02-18: qty 2

## 2021-02-18 MED ORDER — ORAL CARE MOUTH RINSE: 15.0000 mL | Freq: Once | OROMUCOSAL | Status: AC

## 2021-02-18 MED ORDER — CEFAZOLIN SODIUM-DEXTROSE 2-4 GM/100ML-% IV SOLN
2.0000 g | INTRAVENOUS | Status: AC
  Administered 2021-02-18: 10:00:00 2 g via INTRAVENOUS
  Filled 2021-02-18: qty 100

## 2021-02-18 MED ORDER — ACETAMINOPHEN 650 MG RE SUPP: 325.0000 mg | RECTAL | Status: DC | PRN

## 2021-02-18 MED ORDER — LIDOCAINE 2% (20 MG/ML) 5 ML SYRINGE
INTRAMUSCULAR | Status: DC | PRN
  Administered 2021-02-18: 100 mg via INTRAVENOUS

## 2021-02-18 MED ORDER — PANTOPRAZOLE SODIUM 40 MG PO TBEC
40.0000 mg | DELAYED_RELEASE_TABLET | Freq: Every day | ORAL | Status: DC
  Administered 2021-02-18 – 2021-02-20 (×3): 40 mg via ORAL
  Filled 2021-02-18 (×3): qty 1

## 2021-02-18 MED ORDER — ACETAMINOPHEN 325 MG PO TABS
325.0000 mg | ORAL_TABLET | ORAL | Status: DC | PRN
  Administered 2021-02-19: 650 mg via ORAL
  Filled 2021-02-18: qty 2

## 2021-02-18 MED ORDER — INSULIN ASPART 100 UNIT/ML IJ SOLN
0.0000 [IU] | Freq: Three times a day (TID) | INTRAMUSCULAR | Status: DC
Start: 2021-02-18 — End: 2021-02-20
  Administered 2021-02-18: 17:00:00 1 [IU] via SUBCUTANEOUS
  Administered 2021-02-19 (×2): 2 [IU] via SUBCUTANEOUS
  Administered 2021-02-19 – 2021-02-20 (×2): 1 [IU] via SUBCUTANEOUS

## 2021-02-18 MED ORDER — 0.9 % SODIUM CHLORIDE (POUR BTL) OPTIME
TOPICAL | Status: DC | PRN
  Administered 2021-02-18: 10:00:00 1000 mL

## 2021-02-18 MED ORDER — FENTANYL CITRATE (PF) 250 MCG/5ML IJ SOLN
INTRAMUSCULAR | Status: DC | PRN
  Administered 2021-02-18: 25 ug via INTRAVENOUS
  Administered 2021-02-18: 50 ug via INTRAVENOUS
  Administered 2021-02-18: 25 ug via INTRAVENOUS

## 2021-02-18 MED ORDER — CARVEDILOL 12.5 MG PO TABS
12.5000 mg | ORAL_TABLET | Freq: Two times a day (BID) | ORAL | Status: DC
  Administered 2021-02-18 – 2021-02-20 (×2): 12.5 mg via ORAL
  Filled 2021-02-18 (×2): qty 1

## 2021-02-18 MED ORDER — CALCIUM ACETATE (PHOS BINDER) 667 MG PO CAPS: 667.0000 mg | ORAL_CAPSULE | Freq: Three times a day (TID) | ORAL | Status: DC | PRN

## 2021-02-18 MED ORDER — ONDANSETRON HCL 4 MG/2ML IJ SOLN: 4.0000 mg | Freq: Four times a day (QID) | INTRAMUSCULAR | Status: DC | PRN

## 2021-02-18 MED ORDER — OXYCODONE HCL 5 MG/5ML PO SOLN: 5.0000 mg | Freq: Once | ORAL | Status: DC | PRN

## 2021-02-18 MED ORDER — ONDANSETRON HCL 4 MG/2ML IJ SOLN
INTRAMUSCULAR | Status: DC | PRN
  Administered 2021-02-18: 4 mg via INTRAVENOUS

## 2021-02-18 MED ORDER — SODIUM CHLORIDE 0.9% FLUSH: 3.0000 mL | INTRAVENOUS | Status: DC | PRN

## 2021-02-18 MED ORDER — CLONIDINE HCL 0.1 MG PO TABS
0.1000 mg | ORAL_TABLET | Freq: Two times a day (BID) | ORAL | Status: DC
  Administered 2021-02-18 – 2021-02-20 (×3): 0.1 mg via ORAL
  Filled 2021-02-18 (×3): qty 1

## 2021-02-18 MED ORDER — ALBUMIN HUMAN 5 % IV SOLN: INTRAVENOUS | Status: DC | PRN

## 2021-02-18 MED ORDER — TRAZODONE HCL 50 MG PO TABS
50.0000 mg | ORAL_TABLET | Freq: Every day | ORAL | Status: DC
  Administered 2021-02-18 – 2021-02-20 (×2): 100 mg via ORAL
  Filled 2021-02-18 (×2): qty 2

## 2021-02-18 MED ORDER — PROPOFOL 10 MG/ML IV BOLUS
INTRAVENOUS | Status: DC | PRN
  Administered 2021-02-18: 150 mg via INTRAVENOUS

## 2021-02-18 MED ORDER — LIDOCAINE-EPINEPHRINE (PF) 1 %-1:200000 IJ SOLN
INTRAMUSCULAR | Status: AC
  Filled 2021-02-18: qty 30

## 2021-02-18 MED ORDER — DEXAMETHASONE SODIUM PHOSPHATE 10 MG/ML IJ SOLN
INTRAMUSCULAR | Status: DC | PRN
  Administered 2021-02-18: 5 mg via INTRAVENOUS

## 2021-02-18 MED ORDER — GUAIFENESIN-DM 100-10 MG/5ML PO SYRP: 15.0000 mL | ORAL_SOLUTION | ORAL | Status: DC | PRN

## 2021-02-18 MED ORDER — HYDRALAZINE HCL 20 MG/ML IJ SOLN: 5.0000 mg | INTRAMUSCULAR | Status: DC | PRN

## 2021-02-18 MED ORDER — SODIUM CHLORIDE 0.9 % IV SOLN: INTRAVENOUS | Status: DC

## 2021-02-18 MED ORDER — LABETALOL HCL 5 MG/ML IV SOLN: 10.0000 mg | INTRAVENOUS | Status: DC | PRN

## 2021-02-18 MED ORDER — HYDROMORPHONE HCL 1 MG/ML IJ SOLN: 0.5000 mg | INTRAMUSCULAR | Status: DC | PRN

## 2021-02-18 MED ORDER — EPHEDRINE 5 MG/ML INJ
INTRAVENOUS | Status: AC
  Filled 2021-02-18: qty 5

## 2021-02-18 MED ORDER — DOCUSATE SODIUM 100 MG PO CAPS
100.0000 mg | ORAL_CAPSULE | Freq: Two times a day (BID) | ORAL | Status: DC
  Administered 2021-02-18 – 2021-02-20 (×4): 100 mg via ORAL
  Filled 2021-02-18 (×4): qty 1

## 2021-02-18 MED ORDER — PHENOL 1.4 % MT LIQD: 1.0000 | OROMUCOSAL | Status: DC | PRN

## 2021-02-18 MED ORDER — SODIUM CHLORIDE 0.9 % IV SOLN: 250.0000 mL | INTRAVENOUS | Status: DC | PRN

## 2021-02-18 MED ORDER — HEPARIN SODIUM (PORCINE) 5000 UNIT/ML IJ SOLN
5000.0000 [IU] | Freq: Three times a day (TID) | INTRAMUSCULAR | Status: DC
  Administered 2021-02-19 – 2021-02-20 (×4): 5000 [IU] via SUBCUTANEOUS
  Filled 2021-02-18 (×4): qty 1

## 2021-02-18 MED ORDER — ROSUVASTATIN CALCIUM 20 MG PO TABS
20.0000 mg | ORAL_TABLET | Freq: Every day | ORAL | Status: DC
  Administered 2021-02-18 – 2021-02-20 (×3): 20 mg via ORAL
  Filled 2021-02-18 (×3): qty 1

## 2021-02-18 MED ORDER — DEXAMETHASONE SODIUM PHOSPHATE 10 MG/ML IJ SOLN
INTRAMUSCULAR | Status: AC
  Filled 2021-02-18: qty 1

## 2021-02-18 MED ORDER — OXYCODONE HCL 5 MG PO TABS: 5.0000 mg | ORAL_TABLET | Freq: Once | ORAL | Status: DC | PRN

## 2021-02-18 MED ORDER — METOPROLOL TARTRATE 5 MG/5ML IV SOLN: 2.0000 mg | INTRAVENOUS | Status: DC | PRN

## 2021-02-18 MED ORDER — SODIUM CHLORIDE 0.9 % IV SOLN: INTRAVENOUS | Status: DC | PRN

## 2021-02-18 MED ORDER — MIDAZOLAM HCL 2 MG/2ML IJ SOLN
INTRAMUSCULAR | Status: AC
  Filled 2021-02-18: qty 2

## 2021-02-18 MED ORDER — AMLODIPINE BESYLATE 10 MG PO TABS
10.0000 mg | ORAL_TABLET | Freq: Every day | ORAL | Status: DC
  Administered 2021-02-18 – 2021-02-20 (×2): 10 mg via ORAL
  Filled 2021-02-18 (×2): qty 1

## 2021-02-18 MED ORDER — FENTANYL CITRATE (PF) 100 MCG/2ML IJ SOLN
25.0000 ug | INTRAMUSCULAR | Status: DC | PRN
  Administered 2021-02-18: 11:00:00 25 ug via INTRAVENOUS

## 2021-02-18 MED ORDER — SODIUM CHLORIDE 0.9% FLUSH
3.0000 mL | Freq: Two times a day (BID) | INTRAVENOUS | Status: DC
  Administered 2021-02-18 – 2021-02-20 (×4): 3 mL via INTRAVENOUS

## 2021-02-18 MED ORDER — POTASSIUM CHLORIDE CRYS ER 20 MEQ PO TBCR
20.0000 meq | EXTENDED_RELEASE_TABLET | Freq: Once | ORAL | Status: DC
  Filled 2021-02-18: qty 2

## 2021-02-18 MED ORDER — FUROSEMIDE 40 MG PO TABS
80.0000 mg | ORAL_TABLET | ORAL | Status: DC
  Administered 2021-02-18 – 2021-02-20 (×2): 80 mg via ORAL
  Filled 2021-02-18 (×2): qty 2

## 2021-02-18 MED ORDER — OXYCODONE-ACETAMINOPHEN 5-325 MG PO TABS: 1.0000 | ORAL_TABLET | ORAL | Status: DC | PRN

## 2021-02-18 MED ORDER — FUROSEMIDE 40 MG PO TABS
60.0000 mg | ORAL_TABLET | ORAL | Status: DC
  Filled 2021-02-18: qty 1

## 2021-02-18 MED ORDER — EPHEDRINE SULFATE-NACL 50-0.9 MG/10ML-% IV SOSY
PREFILLED_SYRINGE | INTRAVENOUS | Status: DC | PRN
  Administered 2021-02-18: 5 mg via INTRAVENOUS

## 2021-02-18 MED ORDER — FUROSEMIDE 20 MG PO TABS: 60.0000 mg | ORAL_TABLET | Freq: Two times a day (BID) | ORAL | Status: DC

## 2021-02-18 MED ORDER — HEPARIN 6000 UNIT IRRIGATION SOLUTION
Status: AC
  Filled 2021-02-18: qty 500

## 2021-02-18 MED ORDER — FENOFIBRATE 160 MG PO TABS
160.0000 mg | ORAL_TABLET | Freq: Every day | ORAL | Status: DC
  Administered 2021-02-18 – 2021-02-20 (×3): 160 mg via ORAL
  Filled 2021-02-18 (×3): qty 1

## 2021-02-18 MED ORDER — CEFAZOLIN SODIUM-DEXTROSE 2-4 GM/100ML-% IV SOLN
2.0000 g | Freq: Three times a day (TID) | INTRAVENOUS | Status: AC
  Administered 2021-02-18 – 2021-02-19 (×2): 2 g via INTRAVENOUS
  Filled 2021-02-18 (×2): qty 100

## 2021-02-18 MED ORDER — PHENYLEPHRINE HCL-NACL 20-0.9 MG/250ML-% IV SOLN
INTRAVENOUS | Status: DC | PRN
  Administered 2021-02-18: 25 ug/min via INTRAVENOUS

## 2021-02-18 MED ORDER — CALCIUM ACETATE (PHOS BINDER) 667 MG PO CAPS
1334.0000 mg | ORAL_CAPSULE | Freq: Three times a day (TID) | ORAL | Status: DC
  Administered 2021-02-18 – 2021-02-20 (×6): 1334 mg via ORAL
  Filled 2021-02-18 (×6): qty 2

## 2021-02-18 MED ORDER — SENNOSIDES-DOCUSATE SODIUM 8.6-50 MG PO TABS: 1.0000 | ORAL_TABLET | Freq: Every evening | ORAL | Status: DC | PRN

## 2021-02-18 MED ORDER — ONDANSETRON HCL 4 MG/2ML IJ SOLN
INTRAMUSCULAR | Status: AC
  Filled 2021-02-18: qty 2

## 2021-02-18 SURGICAL SUPPLY — 38 items
ADH SKN CLS APL DERMABOND .7 (GAUZE/BANDAGES/DRESSINGS) ×2
BAG COUNTER SPONGE SURGICOUNT (BAG) ×3 IMPLANT
BAG SPNG CNTER NS LX DISP (BAG) ×2
BNDG ELASTIC 4X5.8 VLCR STR LF (GAUZE/BANDAGES/DRESSINGS) ×2 IMPLANT
BNDG ELASTIC 6X5.8 VLCR STR LF (GAUZE/BANDAGES/DRESSINGS) ×2 IMPLANT
BNDG GAUZE ELAST 4 BULKY (GAUZE/BANDAGES/DRESSINGS) ×2 IMPLANT
CANISTER SUCT 3000ML PPV (MISCELLANEOUS) ×3 IMPLANT
CLIP VESOCCLUDE MED 6/CT (CLIP) ×3 IMPLANT
CLIP VESOCCLUDE SM WIDE 6/CT (CLIP) ×3 IMPLANT
DERMABOND ADVANCED (GAUZE/BANDAGES/DRESSINGS) ×1
DERMABOND ADVANCED .7 DNX12 (GAUZE/BANDAGES/DRESSINGS) ×2 IMPLANT
ELECT REM PT RETURN 9FT ADLT (ELECTROSURGICAL) ×3
ELECTRODE REM PT RTRN 9FT ADLT (ELECTROSURGICAL) ×2 IMPLANT
GLOVE SRG 8 PF TXTR STRL LF DI (GLOVE) ×2 IMPLANT
GLOVE SURG POLYISO LF SZ7.5 (GLOVE) ×3 IMPLANT
GLOVE SURG UNDER POLY LF SZ8 (GLOVE) ×3
GOWN STRL REUS W/ TWL LRG LVL3 (GOWN DISPOSABLE) ×4 IMPLANT
GOWN STRL REUS W/ TWL XL LVL3 (GOWN DISPOSABLE) ×2 IMPLANT
GOWN STRL REUS W/TWL LRG LVL3 (GOWN DISPOSABLE) ×6
GOWN STRL REUS W/TWL XL LVL3 (GOWN DISPOSABLE) ×3
HEMOSTAT SNOW SURGICEL 2X4 (HEMOSTASIS) IMPLANT
KIT BASIN OR (CUSTOM PROCEDURE TRAY) ×3 IMPLANT
KIT TURNOVER KIT B (KITS) ×3 IMPLANT
NS IRRIG 1000ML POUR BTL (IV SOLUTION) ×3 IMPLANT
PACK CV ACCESS (CUSTOM PROCEDURE TRAY) ×3 IMPLANT
PAD ABD 8X10 STRL (GAUZE/BANDAGES/DRESSINGS) ×2 IMPLANT
PAD ARMBOARD 7.5X6 YLW CONV (MISCELLANEOUS) ×6 IMPLANT
SUT ETHILON 3 0 PS 1 (SUTURE) IMPLANT
SUT PROLENE 5 0 C 1 24 (SUTURE) ×2 IMPLANT
SUT PROLENE 6 0 BV (SUTURE) IMPLANT
SUT SILK 0 TIES 10X30 (SUTURE) ×3 IMPLANT
SUT VIC AB 3-0 SH 27 (SUTURE) ×3
SUT VIC AB 3-0 SH 27X BRD (SUTURE) ×2 IMPLANT
SUT VICRYL 4-0 PS2 18IN ABS (SUTURE) ×2 IMPLANT
SWAB CULTURE LIQUID MINI MALE (MISCELLANEOUS) ×2 IMPLANT
TOWEL GREEN STERILE (TOWEL DISPOSABLE) ×3 IMPLANT
UNDERPAD 30X36 HEAVY ABSORB (UNDERPADS AND DIAPERS) ×3 IMPLANT
WATER STERILE IRR 1000ML POUR (IV SOLUTION) ×3 IMPLANT

## 2021-02-18 NOTE — Anesthesia Postprocedure Evaluation (Signed)
Anesthesia Post Note  Patient: [name].  Procedure(s) Performed: LEFT ARM DEBRIDEMENT (Left: Arm Upper)     Patient location during evaluation: PACU Anesthesia Type: General Level of consciousness: awake and alert Pain management: pain level controlled Vital Signs Assessment: post-procedure vital signs reviewed and stable Respiratory status: spontaneous breathing, nonlabored ventilation, respiratory function stable and patient connected to nasal cannula oxygen Cardiovascular status: blood pressure returned to baseline and stable Postop Assessment: no apparent nausea or vomiting Anesthetic complications: no   No notable events documented.  Last Vitals:  Vitals:   02/18/21 1230 02/18/21 1300  BP: 115/68 117/67  Pulse: 77 71  Resp: 17 14  Temp: 36.6 C   SpO2: 100% 99%    Last Pain:  Vitals:   02/18/21 1230  TempSrc:   PainSc: 0-No pain                 Merwyn Hodapp S

## 2021-02-18 NOTE — Anesthesia Procedure Notes (Signed)
Procedure Name: LMA Insertion Date/Time: 02/18/2021 9:57 AM Performed by: Gaylene Brooks, CRNA Pre-anesthesia Checklist: Patient identified, Emergency Drugs available, Suction available and Patient being monitored Patient Re-evaluated:Patient Re-evaluated prior to induction Oxygen Delivery Method: Circle System Utilized Preoxygenation: Pre-oxygenation with 100% oxygen Induction Type: IV induction Ventilation: Mask ventilation without difficulty LMA: LMA inserted LMA Size: 5.0 Number of attempts: 1 Airway Equipment and Method: Bite block Placement Confirmation: positive ETCO2 Tube secured with: Tape Dental Injury: Teeth and Oropharynx as per pre-operative assessment

## 2021-02-18 NOTE — Op Note (Signed)
    Patient name: [name]. MRN: [id number] DOB: 04-Sep-1955 Sex: male  02/18/2021 Pre-operative Diagnosis: left arm wound Post-operative diagnosis:  Same Surgeon:  [name] Assistants:  Ivin Booty Procedure:   I&D of left arm wound, including skin ,ans subcutaneous tissue (15 x 4 x 2 cm) Anesthesia:  General Blood Loss:  400 cc Specimens:  wound cultures were sent  Findings:  necrotic tissue successfully debrided within the wound.  Necrotic skin between the incisions were excised.  The fistula was not ever visualized.  There was significant oozing from the soft tissue.  The wound was packed with betadine soaked gauze  Indications:  This is a 65 year old gentleman who has recently undergone basilic vein fistula creation.  He has separate incisions which have deteriorated.  He is here for surgical exploration.  Procedure:  The patient was identified in the holding area and taken to [place] [facility]  The patient was then placed supine on the table. general anesthesia was administered.  The patient was prepped and draped in the usual sterile fashion.  A time out was called and antibiotics were administered.  An assistant was necessary to explain the procedure and assist with technical details.  10.  Blade was used to sharply debride the subcutaneous tissue within the wound.  Wound cultures were sent.  I also excised cases next week the skin bridge between the 2 incisions which did not connect underneath the skin bridge.   I also excised some of the necrotic skin edges.  Ultimately I was able to get back to healthy tissue.  The patient had significant oozing from all raw surface areas and so I elected to pack the wound with Betadine soaked gauze, Kerlix and an Ace wrap.  He was taken recovery in stable condition   Disposition: To PACU stable   V. [name], M.D., [facility] Vascular and Vein Specialists of [place] Office: [phone] Pager:  [phone]

## 2021-02-18 NOTE — Progress Notes (Signed)
Patient arrived to unit via Orvil Feil, Therapist, sports. Vital signs stable, CHG provided, tele placed, and CCMD notified of new admit. Assessment was performed. Palmar pulse taken on left hand and great signal with doppler. Patient's son arrived at bedside. Patient oriented to unit. Menu provided for patient to place order.

## 2021-02-18 NOTE — Transfer of Care (Signed)
Immediate Anesthesia Transfer of Care Note  Patient: [name].  Procedure(s) Performed: LEFT ARM DEBRIDEMENT (Left: Arm Upper)  Patient Location: PACU  Anesthesia Type:General  Level of Consciousness: awake, alert  and oriented  Airway & Oxygen Therapy: Patient Spontanous Breathing and Patient connected to nasal cannula oxygen  Post-op Assessment: Report given to RN, Post -op Vital signs reviewed and stable and Patient moving all extremities X 4  Post vital signs: Reviewed and stable  Last Vitals:  Vitals Value Taken Time  BP 119/63 02/18/21 1059  Temp    Pulse 72 02/18/21 1102  Resp 15 02/18/21 1102  SpO2 99 % 02/18/21 1102  Vitals shown include unvalidated device data.  Last Pain:  Vitals:   02/18/21 0815  TempSrc:   PainSc: 2       Patients Stated Pain Goal: 2 (81/85/90 9311)  Complications: No notable events documented.

## 2021-02-18 NOTE — Interval H&P Note (Signed)
History and Physical Interval Note:  02/18/2021 9:19 AM  [name].  has presented today for surgery, with the diagnosis of Left arm wound.  The various methods of treatment have been discussed with the patient and family. After consideration of risks, benefits and other options for treatment, the patient has consented to  Procedure(s): LEFT ARM DEBRIDEMENT (Left) LIGATION OF LEFT ARM FISTULA (Left) as a surgical intervention.  The patient's history has been reviewed, patient examined, no change in status, stable for surgery.  I have reviewed the patient's chart and labs.  Questions were answered to the patient's satisfaction.     [name]

## 2021-02-18 NOTE — Interval H&P Note (Signed)
History and Physical Interval Note:  02/18/2021 9:20 AM  [name].  has presented today for surgery, with the diagnosis of Left arm wound.  The various methods of treatment have been discussed with the patient and family. After consideration of risks, benefits and other options for treatment, the patient has consented to  Procedure(s): LEFT ARM DEBRIDEMENT (Left) LIGATION OF LEFT ARM FISTULA (Left) as a surgical intervention.  The patient's history has been reviewed, patient examined, no change in status, stable for surgery.  I have reviewed the patient's chart and labs.  Questions were answered to the patient's satisfaction.     [name]

## 2021-02-19 ENCOUNTER — Encounter (HOSPITAL_COMMUNITY): Payer: Self-pay | Admitting: Surgery

## 2021-02-19 DIAGNOSIS — E785 Hyperlipidemia, unspecified: Secondary | ICD-10-CM | POA: Diagnosis present

## 2021-02-19 DIAGNOSIS — Z95810 Presence of automatic (implantable) cardiac defibrillator: Secondary | ICD-10-CM | POA: Diagnosis not present

## 2021-02-19 DIAGNOSIS — E1152 Type 2 diabetes mellitus with diabetic peripheral angiopathy with gangrene: Secondary | ICD-10-CM | POA: Diagnosis present

## 2021-02-19 DIAGNOSIS — Z992 Dependence on renal dialysis: Secondary | ICD-10-CM | POA: Diagnosis not present

## 2021-02-19 DIAGNOSIS — Z20822 Contact with and (suspected) exposure to covid-19: Secondary | ICD-10-CM | POA: Diagnosis present

## 2021-02-19 DIAGNOSIS — Z7902 Long term (current) use of antithrombotics/antiplatelets: Secondary | ICD-10-CM | POA: Diagnosis not present

## 2021-02-19 DIAGNOSIS — I5042 Chronic combined systolic (congestive) and diastolic (congestive) heart failure: Secondary | ICD-10-CM | POA: Diagnosis present

## 2021-02-19 DIAGNOSIS — N186 End stage renal disease: Secondary | ICD-10-CM | POA: Diagnosis present

## 2021-02-19 DIAGNOSIS — Z823 Family history of stroke: Secondary | ICD-10-CM | POA: Diagnosis not present

## 2021-02-19 DIAGNOSIS — I428 Other cardiomyopathies: Secondary | ICD-10-CM | POA: Diagnosis present

## 2021-02-19 DIAGNOSIS — Z87891 Personal history of nicotine dependence: Secondary | ICD-10-CM | POA: Diagnosis not present

## 2021-02-19 DIAGNOSIS — M898X9 Other specified disorders of bone, unspecified site: Secondary | ICD-10-CM | POA: Diagnosis present

## 2021-02-19 DIAGNOSIS — M1990 Unspecified osteoarthritis, unspecified site: Secondary | ICD-10-CM | POA: Diagnosis present

## 2021-02-19 DIAGNOSIS — Z8673 Personal history of transient ischemic attack (TIA), and cerebral infarction without residual deficits: Secondary | ICD-10-CM | POA: Diagnosis not present

## 2021-02-19 DIAGNOSIS — Z794 Long term (current) use of insulin: Secondary | ICD-10-CM | POA: Diagnosis not present

## 2021-02-19 DIAGNOSIS — Z8249 Family history of ischemic heart disease and other diseases of the circulatory system: Secondary | ICD-10-CM | POA: Diagnosis not present

## 2021-02-19 DIAGNOSIS — Z7982 Long term (current) use of aspirin: Secondary | ICD-10-CM | POA: Diagnosis not present

## 2021-02-19 DIAGNOSIS — Y718 Miscellaneous cardiovascular devices associated with adverse incidents, not elsewhere classified: Secondary | ICD-10-CM | POA: Diagnosis present

## 2021-02-19 DIAGNOSIS — Z9889 Other specified postprocedural states: Secondary | ICD-10-CM

## 2021-02-19 DIAGNOSIS — T8131XA Disruption of external operation (surgical) wound, not elsewhere classified, initial encounter: Secondary | ICD-10-CM | POA: Diagnosis present

## 2021-02-19 DIAGNOSIS — E1122 Type 2 diabetes mellitus with diabetic chronic kidney disease: Secondary | ICD-10-CM | POA: Diagnosis present

## 2021-02-19 DIAGNOSIS — Z79899 Other long term (current) drug therapy: Secondary | ICD-10-CM | POA: Diagnosis not present

## 2021-02-19 DIAGNOSIS — Z89519 Acquired absence of unspecified leg below knee: Secondary | ICD-10-CM | POA: Diagnosis not present

## 2021-02-19 DIAGNOSIS — Z833 Family history of diabetes mellitus: Secondary | ICD-10-CM | POA: Diagnosis not present

## 2021-02-19 DIAGNOSIS — I132 Hypertensive heart and chronic kidney disease with heart failure and with stage 5 chronic kidney disease, or end stage renal disease: Secondary | ICD-10-CM | POA: Diagnosis present

## 2021-02-19 DIAGNOSIS — T8130XA Disruption of wound, unspecified, initial encounter: Secondary | ICD-10-CM | POA: Diagnosis present

## 2021-02-19 LAB — CBC WITH DIFFERENTIAL/PLATELET
Abs Immature Granulocytes: 0.04 10*3/uL (ref 0.00–0.07)
Basophils Absolute: 0 10*3/uL (ref 0.0–0.1)
Basophils Relative: 0 %
Eosinophils Absolute: 0 10*3/uL (ref 0.0–0.5)
Eosinophils Relative: 0 %
HCT: 23.4 % — ABNORMAL LOW (ref 39.0–52.0)
Hemoglobin: 7 g/dL — ABNORMAL LOW (ref 13.0–17.0)
Immature Granulocytes: 0 %
Lymphocytes Relative: 10 %
Lymphs Abs: 0.9 10*3/uL (ref 0.7–4.0)
MCH: 29.9 pg (ref 26.0–34.0)
MCHC: 29.9 g/dL — ABNORMAL LOW (ref 30.0–36.0)
MCV: 100 fL (ref 80.0–100.0)
Monocytes Absolute: 0.5 10*3/uL (ref 0.1–1.0)
Monocytes Relative: 6 %
Neutro Abs: 7.7 10*3/uL (ref 1.7–7.7)
Neutrophils Relative %: 84 %
Platelets: 233 10*3/uL (ref 150–400)
RBC: 2.34 MIL/uL — ABNORMAL LOW (ref 4.22–5.81)
RDW: 13.3 % (ref 11.5–15.5)
WBC: 9.2 10*3/uL (ref 4.0–10.5)
nRBC: 0 % (ref 0.0–0.2)

## 2021-02-19 LAB — RESP PANEL BY RT-PCR (FLU A&B, COVID) ARPGX2
Influenza A by PCR: NEGATIVE
Influenza B by PCR: NEGATIVE
SARS Coronavirus 2 by RT PCR: NEGATIVE

## 2021-02-19 LAB — HEPATITIS B SURFACE ANTIBODY,QUALITATIVE: Hep B S Ab: REACTIVE — AB

## 2021-02-19 LAB — RENAL FUNCTION PANEL
Albumin: 2.4 g/dL — ABNORMAL LOW (ref 3.5–5.0)
Anion gap: 12 (ref 5–15)
BUN: 47 mg/dL — ABNORMAL HIGH (ref 8–23)
CO2: 25 mmol/L (ref 22–32)
Calcium: 8.6 mg/dL — ABNORMAL LOW (ref 8.9–10.3)
Chloride: 97 mmol/L — ABNORMAL LOW (ref 98–111)
Creatinine, Ser: 7.57 mg/dL — ABNORMAL HIGH (ref 0.61–1.24)
GFR, Estimated: 7 mL/min — ABNORMAL LOW (ref >60)
Glucose, Bld: 193 mg/dL — ABNORMAL HIGH (ref 70–99)
Phosphorus: 5.4 mg/dL — ABNORMAL HIGH (ref 2.5–4.6)
Potassium: 4.5 mmol/L (ref 3.5–5.1)
Sodium: 134 mmol/L — ABNORMAL LOW (ref 135–145)

## 2021-02-19 LAB — GLUCOSE, CAPILLARY
Glucose-Capillary: 225 mg/dL — ABNORMAL HIGH (ref 70–99)
Glucose-Capillary: 186 mg/dL — ABNORMAL HIGH (ref 70–99)
Glucose-Capillary: 208 mg/dL — ABNORMAL HIGH (ref 70–99)

## 2021-02-19 LAB — HEPATITIS B SURFACE ANTIGEN: Hepatitis B Surface Ag: NONREACTIVE

## 2021-02-19 MED ORDER — CHLORHEXIDINE GLUCONATE CLOTH 2 % EX PADS
6.0000 | MEDICATED_PAD | Freq: Every day | CUTANEOUS | Status: DC
  Administered 2021-02-19 – 2021-02-20 (×2): 6 via TOPICAL

## 2021-02-19 MED ORDER — HEPARIN SODIUM (PORCINE) 1000 UNIT/ML DIALYSIS
2000.0000 [IU] | INTRAMUSCULAR | Status: DC | PRN
  Filled 2021-02-19: qty 2

## 2021-02-19 MED ORDER — CHLORHEXIDINE GLUCONATE CLOTH 2 % EX PADS
6.0000 | MEDICATED_PAD | Freq: Every day | CUTANEOUS | Status: DC
  Administered 2021-02-20: 6 via TOPICAL

## 2021-02-19 NOTE — Consult Note (Signed)
Renal Service Consult Note [facility]  Godfrey Pick. 02/19/2021 Sol Blazing, MD Requesting Physician: Dr. Trula Slade  Reason for Consult: ESRD pt w/ access wound infection HPI: The patient is a 65 y.o. year-old w/ hx of ACD/ CHF/ NICM/ sp AICD, DM2, hx TIA's, HTN, PAD, CVA who went for L arm 2nd stage basilic AVF placement on 01/12/21. The wound underwent some breakdown so the patient was admitted for I&D which was done yesterday per Dr Trula Slade. We are asked to see for ESRD.     Pt seen in room.  States no sig swelling. Doing well today, no c/o's. Pt lives alone, uses a WC to get around. Gets [place] transportation . R TDC placed about 3-4 mos ago per the pt.     ROS - denies CP, no joint pain, no HA, no blurry vision, no rash, no diarrhea, no nausea/ vomiting, no dysuria, no difficulty voiding   Past Medical History  Past Medical History:  Diagnosis Date   AICD (automatic cardioverter/defibrillator) present    Anemia    Arthritis    Cerebrovascular disease    CHF (congestive heart failure) (HCC)    Chronic kidney disease    ESRD, MWF HD   Diabetes mellitus    type II   History of TIAs    Hyperlipidemia    Hypertension    Nonischemic cardiomyopathy (HCC)    PVD (peripheral vascular disease) (HCC)    s/p B BKA   Shortness of breath dyspnea    with exertion    Skin disease    Rare   Stroke ([place])    "light stroke"   Tobacco abuse    Past Surgical History  Past Surgical History:  Procedure Laterality Date   A/V FISTULAGRAM Left 06/24/2016   Procedure: A/V Fistulagram;  Surgeon: Elam Dutch, MD;  Location: [place] CV [facility];  Service: Cardiovascular;  Laterality: Left;   A/V FISTULAGRAM N/A 01/17/2017   Procedure: A/V Fistulagram - left arm;  Surgeon: Serafina Mitchell, MD;  Location: [place] CV [facility];  Service: Cardiovascular;  Laterality: N/A;   AV FISTULA PLACEMENT Left 04/08/2015   Procedure: Creation of Left arm BRACHIOCEPHALIC  ARTERIOVENOUS  FISTULA ;  Surgeon: Mal Misty, MD;  Location: [place];  Service: Vascular;  Laterality: Left;   AV FISTULA PLACEMENT Left 10/27/2020   Procedure: LEFT ARM ARTERIOVENOUS (AV) FISTULA CREATION;  Surgeon: Rosetta Posner, MD;  Location: [facility];  Service: Vascular;  Laterality: Left;   [place] Left 01/12/2021   Procedure: LEFT ARM SECOND STAGE BASILIC VEIN TRANSPOSITION;  Surgeon: Rosetta Posner, MD;  Location: [facility];  Service: Vascular;  Laterality: Left;   CARDIAC DEFIBRILLATOR [place] EXTRACTION W/PHACO Right 03/17/2015   Procedure: CATARACT EXTRACTION PHACO AND INTRAOCULAR LENS PLACEMENT ([place]);  Surgeon: Rutherford Guys, MD;  Location: [facility];  Service: Ophthalmology;  Laterality: Right;  CDE: 6.59   EYE SURGERY Right    Cataract   LEG AMPUTATION BELOW KNEE     bilateral   PERIPHERAL VASCULAR INTERVENTION  01/17/2017   Procedure: PERIPHERAL VASCULAR INTERVENTION;  Surgeon: Serafina Mitchell, MD;  Location: [place] [facility];  Service: Cardiovascular;;  PTA  left arm fistula   WOUND DEBRIDEMENT Left 02/18/2021   Procedure: LEFT ARM DEBRIDEMENT;  Surgeon: Serafina Mitchell, MD;  Location: [place];  Service: Vascular;  Laterality: Left;   Family History  Family  History  Problem Relation Age of Onset   Diabetes Father    Stroke Father    Diabetes Brother    Diabetes Brother    Diabetes Brother    Diabetes Brother    Heart failure Mother    Diabetes Mother    Diabetes Other    Coronary artery disease Other    Social History  reports that he quit smoking about 22 years ago. His smoking use included cigarettes. He has a 20.00 pack-year smoking history. He has never used smokeless tobacco. He reports that he does not currently use alcohol. He reports that he does not use drugs. Allergies  Allergies  Allergen Reactions   Acetazolamide Other (See Comments)    Jittery odd feeling. (hyper feeling)   Contrast  Media [Iodinated Diagnostic Agents] Hives    In the 80's    Other Other (See Comments)    Transfer Dye---"Makes me tired"   Home medications Prior to Admission medications   Medication Sig Start Date End Date Taking? Authorizing Provider  acetaminophen (TYLENOL) 500 MG tablet Take 1,000 mg by mouth every 8 (eight) hours as needed for mild pain or headache.    Yes [provider]  amLODipine (NORVASC) 10 MG tablet Take 10 mg by mouth daily. for high blood pressure 05/09/17  Yes [provider]  aspirin 81 MG tablet Take 81 mg by mouth daily.     Yes [provider]  brimonidine (ALPHAGAN) 0.2 % ophthalmic solution Place 1 drop into the right eye 3 (three) times daily.   Yes [provider]  calcium acetate (PHOSLO) 667 MG capsule Take 667-1,334 mg by mouth See admin instructions. 2 caps three times daily with meals, and 1 capsule with snacks 03/25/15  Yes [provider]  carvedilol (COREG) 12.5 MG tablet take 1 tablet by mouth twice daily.  DO NOT TAKE IN THE MORNING ON DIALYSIS DAYS 09/29/20  Yes Evans Lance, MD  cloNIDine (CATAPRES) 0.1 MG tablet Take 1 tablet (0.1 mg total) by mouth at bedtime. Patient taking differently: Take 0.1 mg by mouth 2 (two) times daily. 06/28/16  Yes Domenic Polite, MD  clopidogrel (PLAVIX) 75 MG tablet Take 75 mg by mouth daily.     Yes [provider]  dorzolamide-timolol (COSOPT) 22.3-6.8 MG/ML ophthalmic solution Place 1 drop into the right eye 2 (two) times daily.   Yes [provider]  fenofibrate 160 MG tablet Take 160 mg by mouth daily. 03/09/15  Yes [provider]  fluticasone (FLONASE) 50 MCG/ACT nasal spray Place 1 spray into both nostrils daily as needed for allergies.   Yes [provider]  folic acid (FOLVITE) 1 MG tablet Take 1 mg by mouth daily.   Yes [provider]  furosemide (LASIX) 20 MG tablet Take 60-80 mg by mouth 2 (two) times daily. Take 3 tabs twice  daily on NON DIALYSIS DAYS   Yes [provider]  hydrALAZINE (APRESOLINE) 25 MG tablet Take 1 tablet (25 mg total) by mouth 3 (three) times daily. Please make overdue appt with Dr. Harrington Challenger before anymore refills. 1st attempt 04/25/18  Yes Fay Records, MD  insulin lispro (HUMALOG) 100 UNIT/ML KiwkPen Inject 15-20 Units into the skin 3 (three) times daily. Give per sliding scale   Yes [provider]  LANTUS SOLOSTAR 100 UNIT/ML Solostar Pen Inject 50 Units into the skin at bedtime. 02/05/21  Yes [provider]  latanoprost (XALATAN) 0.005 % ophthalmic solution Place 1 drop into the  right eye at bedtime.   Yes [provider]  oxyCODONE-acetaminophen (PERCOCET) 5-325 MG tablet Take 1 tablet by mouth every 6 (six) hours as needed for severe pain. 01/12/21  Yes Early, Arvilla Meres, MD  rosuvastatin (CRESTOR) 20 MG tablet Take 20 mg by mouth daily. 02/24/15  Yes [provider]  traZODone (DESYREL) 50 MG tablet Take 50-100 mg by mouth at bedtime. 12/25/20  Yes [provider]     Vitals:   02/18/21 2313 02/19/21 0325 02/19/21 0815 02/19/21 1205  BP: 134/75 124/67 136/69 130/82  Pulse: 80 76 70 77  Resp: 16 18 (!) 21 20  Temp: 97.7 F (36.5 C) 97.6 F (36.4 C) (!) 97.5 F (36.4 C) (!) 97.5 F (36.4 C)  TempSrc: Oral Oral Oral Oral  SpO2: 98% 94% 99% 96%  Weight:      Height:       Exam Gen alert, no distress No rash, cyanosis or gangrene Sclera anicteric, throat clear  No jvd or bruits Chest bilat crackles at the bases 1/3 up RRR no MRG Abd soft ntnd no mass or ascites +bs GU normal male MS no joint effusions or deformity Ext bilat BKA, no edema, no UE edema Neuro is alert, Ox 3 , nf  RIJ TDC   L arm wrapped       Home meds include - norvasc, asa, phoslo 2 ac tid, coreg 12.5 bid, clonidine 0.1 bid, plavix, lasix 60 mg bid (hold am of HD on mwf), hydralazine 25 tid, humalog 15-20 tid ac sq, lantus insulin 50u, percocet prn, crestor,  desyrel hs, vits / supps, prns     Na 134  K 4.3  CO2 24  BUN 29  Cr 5.79  Ca 8.6  Alb 3.0   WBC 9K  Hb 8.5 plt 225 k   OP HD: MWF [facility]  (not answering the phones)   - get records   - has R IJ TDC  Assessment/ Plan: Wound dehiscence - in LUA, sp OR today. Per Dr Trula Slade VVS.   ESRD - on HD MW.  Will add him to the list for HD today, maybe postponed until tonight or tomorrow.  HTN - BP's wnl, cont home meds MBD ckd - cont binder, get records Anemia ckd - Hb 8- 11, follow. HD unit not open to get records.       Kelly Splinter  MD 02/19/2021, 1:00 PM  Recent Labs  Lab 02/18/21 0822 02/18/21 1522  WBC  --  9.6  HGB 11.2* 8.5*   Recent Labs  Lab 02/18/21 0822 02/18/21 1522  K 3.9 4.3  BUN 26* 29*  CREATININE 5.90* 5.79*  CALCIUM  --  8.6*

## 2021-02-19 NOTE — Progress Notes (Signed)
    Subjective  - POD #1  No complaints this am   Physical Exam:  Dressing dry       Assessment/Plan:  POD #1  Plan for vac placement today and possible discharge Will consult renal for dialysis today PO abx for 7 days.  Cx with WBC Follow up in [place] with Dr. Donnetta Hutching in 2-3 weeks  [name] 02/19/2021 9:51 AM --  Vitals:   02/19/21 0325 02/19/21 0815  BP: 124/67 136/69  Pulse: 76 70  Resp: 18 (!) 21  Temp: 97.6 F (36.4 C) (!) 97.5 F (36.4 C)  SpO2: 94% 99%    Intake/Output Summary (Last 24 hours) at 02/19/2021 0951 Last data filed at 02/18/2021 1752 Gross per 24 hour  Intake 685 ml  Output 400 ml  Net 285 ml     Laboratory CBC    Component Value Date/Time   WBC 9.6 02/18/2021 1522   HGB 8.5 (L) 02/18/2021 1522   HCT 27.7 (L) 02/18/2021 1522   PLT 225 02/18/2021 1522    BMET    Component Value Date/Time   NA 134 (L) 02/18/2021 1522   K 4.3 02/18/2021 1522   CL 97 (L) 02/18/2021 1522   CO2 24 02/18/2021 1522   GLUCOSE 156 (H) 02/18/2021 1522   BUN 29 (H) 02/18/2021 1522   CREATININE 5.79 (H) 02/18/2021 1522   CALCIUM 8.6 (L) 02/18/2021 1522   GFRNONAA 10 (L) 02/18/2021 1522   GFRAA 20 (L) 10/21/2016 0703    COAG Lab Results  Component Value Date   INR 1.3 (H) 02/18/2021   INR 1.22 06/24/2016   INR 1.30 06/18/2016   No results found for: PTT  Antibiotics Anti-infectives (From admission, onward)    Start     Dose/Rate Route Frequency Ordered Stop   02/18/21 1800  ceFAZolin (ANCEF) IVPB 2g/100 mL premix        2 g 200 mL/hr over 30 Minutes Intravenous Every 8 hours 02/18/21 1428 02/19/21 0529   02/18/21 0746  ceFAZolin (ANCEF) IVPB 2g/100 mL premix        2 g 200 mL/hr over 30 Minutes Intravenous 30 min pre-op 02/18/21 0746 02/18/21 1000        V. Leia Alf, M.D., [facility] [place] Office: [phone] Pager:  [phone]

## 2021-02-19 NOTE — Consult Note (Signed)
[place] Nurse Consult Note: Patient receiving care in [place]. Reason for Consult: VAC application to LUE  Wound type: surgical site Pressure Injury POA: Yes/No/NA Measurement: 14.5 cm x 7.2 cm x 3 cm. Greatest depth is at the superior portion of the wound Wound bed: red Drainage (amount, consistency, odor) to be determine Periwound: intact Dressing procedure/placement/frequency: One clearly seen piece of black foam placed into the deepest portion of the wound in the superior aspect of the wound. Then one large piece of black foam placed over the entire wound. Drape applied, immediate seal obtained. Area was lightly wrapped with 6 inch Ace wrap to provide additional security to the dressing and Trac pad.  Patient tolerated very well.  Stated after the suction applied at 125 mmHg, that it was "stinging". I explained this is a common reaction, and it usually settles down after about 30 minutes. If his did not, to reach out to the primary nurse and ask if there was anything additional he could have.  He stated he had 2 tylenol shortly before I arrived.  I did not order additional VAC supplies as I understand he may be discharged soon. I have placed him on the [place] follow up list to be seen Monday in the event he is still in patient.  Val Riles, RN, MSN, CWOCN, CNS-BC, pager [phone]

## 2021-02-19 NOTE — TOC Initial Note (Signed)
Transition of Care (TOC) - Initial/Assessment Note  Valentina Gu, BSN Transitions of Care Unit 4E- RN Case Manager See Treatment Team for direct phone #    Patient Details  Name: [name]. MRN: [id number] Date of Birth: 1955/12/03  Transition of Care [facility]) CM/SW Contact:    Dawayne Patricia, RN Phone Number: 02/19/2021, 11:36 AM  Clinical Narrative:                 Noted referral for home wound VAC need- [employer] home VAC order has been signed- faxed to [place] with [employer]- per conversation with Olivia Mackie home [facility] likely to be approved- she recommends going ahead and placing hospital vac so patient gets VAC benefits while waiting on approval. Once approved will have delivered here to hospital for transition. Planning for d/c on 11/9.   Call made to [facility] with [facility] to check and see if they have referral- per Lattie Haw pt is already active with them for [facility] services- they can continue services and add RN for home [facility] drsg needs- will plan to send RN for wound VAC dsrg change on Monday 11/21 if pt discharges tomorrow.   TOC to follow for any additional needs.    Expected Discharge Plan: [place] Barriers to Discharge: No Barriers Identified   Patient Goals and CMS Choice Patient states their goals for this hospitalization and ongoing recovery are:: return home      Expected Discharge Plan and Services Expected Discharge Plan: [place]   Discharge Planning Services: CM Consult Post Acute Care Choice: Home Health, Resumption of Svcs/PTA Provider Living arrangements for the past 2 months: Apartment                 DME Arranged: Vac DME Agency: [employer] Date DME Agency Contacted: 02/19/21 Time DME Agency Contacted: 28 Representative spoke with at DME Agency: Olivia Mackie HH Arranged: RN, PT [place] Agency: [place] Date [place]: 02/19/21 Time HH Agency Contacted: 1100 Representative spoke with at [place]: Lattie Haw  Prior Living  Arrangements/Services Living arrangements for the past 2 months: Apartment Lives with:: Self, Siblings Patient language and need for interpreter reviewed:: Yes Do you feel safe going back to the place where you live?: Yes      Need for Family Participation in Patient Care: Yes (Comment) Care giver support system in place?: Yes (comment) Current home services: DME, Home PT, Home RN Criminal Activity/Legal Involvement Pertinent to Current Situation/Hospitalization: No - Comment as needed  Activities of Daily Living Home Assistive Devices/Equipment: Wheelchair, CBG Meter ADL Screening (condition at time of admission) Patient's cognitive ability adequate to safely complete daily activities?: Yes Is the patient deaf or have difficulty hearing?: No Does the patient have difficulty seeing, even when wearing glasses/contacts?: No Does the patient have difficulty concentrating, remembering, or making decisions?: No Patient able to express need for assistance with ADLs?: Yes Does the patient have difficulty dressing or bathing?: No Independently performs ADLs?: Yes (appropriate for developmental age) Does the patient have difficulty walking or climbing stairs?: Yes Weakness of Legs: Both Weakness of Arms/Hands: Left  Permission Sought/Granted Permission sought to share information with : Facility Sport and exercise psychologist                Emotional Assessment Appearance:: Appears stated age Attitude/Demeanor/Rapport: Engaged Affect (typically observed): Appropriate Orientation: : Oriented to Self, Oriented to Place, Oriented to  Time, Oriented to Situation Alcohol / Substance Use: Not Applicable Psych Involvement:  No (comment)  Admission diagnosis:  ESRD (end stage renal disease) on dialysis ([place]) [N18.6, Z99.2] Patient Active Problem List   Diagnosis Date Noted   ESRD (end stage renal disease) on dialysis ([place]) 02/18/2021   Anemia 09/15/2016   Abscess 09/09/2016   Abscess of buttock,  right 09/08/2016   ESRD (end stage renal disease) ([place]) 09/08/2016   S/P bilateral BKA (below knee amputation) ([place]) 09/08/2016   Anemia due to chronic kidney disease 09/08/2016   Chronic combined systolic (congestive) and diastolic (congestive) heart failure ([place]) 06/20/2016   Acute hypoxemic respiratory failure ([place]) 06/19/2016   Volume overload 06/19/2016   AKI (acute kidney injury) ([place]) 06/19/2016   DM (diabetes mellitus), type 2 with peripheral vascular complications ([place]) 39/76/7341   SOB (shortness of breath)    ACHILLES BURSITIS OR TENDINITIS 09/14/2009   PLANTAR FACIITIS 09/14/2009   TIA 09/07/2009   Peripheral vascular disease ([place]) 01/07/2009   Hyperlipidemia 08/27/2008   Essential hypertension 08/27/2008   PCP:  [name], MD Pharmacy:   [facility] [address] AT [address] [place] [address] Phone: [phone] Fax: [phone]     Social Determinants of Health (SDOH) Interventions    Readmission Risk Interventions No flowsheet data found.

## 2021-02-19 NOTE — Progress Notes (Signed)
Spoke to [facility] staff regarding pt's out-pt care. Pt receives treatment on MWF. Pt uses transportation and arrives around 9:30-9:45 for a 10:30 chair time. Clinic aware pt for possible d/c over the weekend and to resume on Monday. Navigator will fax d/c summary to clinic on Monday am if pt stable for d/c this weekend. Will assist as needed.  Melven Sartorius Renal Navigator [phone]

## 2021-02-19 NOTE — Progress Notes (Signed)
Inpatient Diabetes Program Recommendations  AACE/ADA: New Consensus Statement on Inpatient Glycemic Control (2015)  Target Ranges:  Prepandial:   less than 140 mg/dL      Peak postprandial:   less than 180 mg/dL (1-2 hours)      Critically ill patients:  140 - 180 mg/dL   Lab Results  Component Value Date   GLUCAP 186 (H) 02/19/2021   HGBA1C 6.2 (H) 02/18/2021    Review of Glycemic Control  Diabetes history: type 2 Outpatient Diabetes medications: Lantus 50 units at HS, novolog 15-20 units TID Current orders for Inpatient glycemic control: Novolog 0-6 units TID  Inpatient Diabetes Program Recommendations:   Noted that patient had surgery yesterday.   If blood sugars begin to be greater than 200 mg/dl, recommend starting 1/2 of home dose: Semglee 25 units at HS, continue Novolog 0-6 units TID. Titrate dosages as needed.   Harvel Ricks RN BSN CDE Diabetes Coordinator Pager: [phone]  8am-5pm

## 2021-02-20 LAB — GLUCOSE, CAPILLARY
Glucose-Capillary: 148 mg/dL — ABNORMAL HIGH (ref 70–99)
Glucose-Capillary: 165 mg/dL — ABNORMAL HIGH (ref 70–99)
Glucose-Capillary: 186 mg/dL — ABNORMAL HIGH (ref 70–99)

## 2021-02-20 MED ORDER — OXYCODONE-ACETAMINOPHEN 5-325 MG PO TABS: 1.0000 | ORAL_TABLET | Freq: Four times a day (QID) | ORAL | 0 refills | Status: DC | PRN

## 2021-02-20 NOTE — Progress Notes (Signed)
Patient being discharged home.  Patient to be transported by transport van.  IV removed with the catheter intact.  Discharge instructions and prescription medication given to the patient who verbalized understanding.

## 2021-02-20 NOTE — Plan of Care (Signed)
  Problem: Education: Goal: Knowledge of General Education information will improve Description: Including pain rating scale, medication(s)/side effects and non-pharmacologic comfort measures Outcome: Adequate for Discharge   Problem: Health Behavior/Discharge Planning: Goal: Ability to manage health-related needs will improve Outcome: Adequate for Discharge   Problem: Clinical Measurements: Goal: Ability to maintain clinical measurements within normal limits will improve Outcome: Adequate for Discharge Goal: Will remain free from infection Outcome: Adequate for Discharge Goal: Diagnostic test results will improve Outcome: Adequate for Discharge Goal: Respiratory complications will improve Outcome: Adequate for Discharge Goal: Cardiovascular complication will be avoided Outcome: Adequate for Discharge   Problem: Activity: Goal: Risk for activity intolerance will decrease Outcome: Adequate for Discharge   Problem: Nutrition: Goal: Adequate nutrition will be maintained Outcome: Adequate for Discharge   Problem: Coping: Goal: Level of anxiety will decrease Outcome: Adequate for Discharge   Problem: Elimination: Goal: Will not experience complications related to bowel motility Outcome: Adequate for Discharge Goal: Will not experience complications related to urinary retention Outcome: Adequate for Discharge   

## 2021-02-20 NOTE — Progress Notes (Signed)
   02/19/21 2341  Vitals  Temp 98 F (36.7 C)  Temp Source Oral  BP (!) 133/59  MAP (mmHg) 82  BP Location Right Arm  BP Method Automatic  Patient Position (if appropriate) Lying  Pulse Rate 79  Pulse Rate Source Monitor  ECG Heart Rate 80  Resp 17  Level of Consciousness  Level of Consciousness Alert  MEWS COLOR  MEWS Score Color Green  Oxygen Therapy  SpO2 97 %  O2 Device Room Air  PCA/Epidural/Spinal Assessment  Respiratory Pattern Regular;Unlabored  Glasgow Coma Scale  Eye Opening 4  Best Verbal Response (NON-intubated) 5  Best Motor Response 6  Glasgow Coma Scale Score 15  MEWS Score  MEWS Temp 0  MEWS Systolic 0  MEWS Pulse 0  MEWS RR 0  MEWS LOC 0  MEWS Score 0  Pt arrived from dialysis. Pt tolerated well. Report received from dialysis nurse, [place]. Pt denies pain. Pt made comfortable. RN will continue to monitor pt.

## 2021-02-20 NOTE — TOC Transition Note (Signed)
Transition of Care [facility]) - CM/SW Discharge Note   Patient Details  Name: [name]. MRN: [id number] Date of Birth: 08/27/1955  Transition of Care [facility]) CM/SW Contact:  Konrad Penta, RN Phone Number: [phone] 02/20/2021, 11:24 AM   Clinical Narrative:  Spoke with patient who states wound vac in in room. Discussed [facility] to follow up on Monday, 11/21 for dressing change. Mr. Persons indicates he is usually at HD from 945 am and returns home around 330 pm. Writer provided this information to Amy with [facility] home health.  [name] states he will have transportation home.   No further needs assessed.     Final next level of care: [place] Barriers to Discharge: No Barriers Identified   Patient Goals and CMS Choice Patient states their goals for this hospitalization and ongoing recovery are:: return home      Discharge Placement                       Discharge Plan and Services   Discharge Planning Services: CM Consult Post Acute Care Choice: Home Health, Resumption of Svcs/PTA Provider          DME Arranged: Vac DME Agency: [employer] Date DME Agency Contacted: 02/19/21 Time DME Agency Contacted: 59 Representative spoke with at DME Agency: Olivia Mackie HH Arranged: RN, PT [facility] Agency: [place] Date [place]: 02/19/21 Time [place]: 1100 Representative spoke with at [place]: [place] ([place]) Interventions     Readmission Risk Interventions No flowsheet data found.

## 2021-02-20 NOTE — Progress Notes (Addendum)
Vascular and Vein Specialists of   Subjective  - He wants to go home    Objective 119/69 81 98.3 F (36.8 C) (Oral) 18 98%  Intake/Output Summary (Last 24 hours) at 02/20/2021 0943 Last data filed at 02/19/2021 2322 Gross per 24 hour  Intake --  Output 2000 ml  Net -2000 ml    Left UE wound vac in place with good seal  Doppler radial and ulnar doppler signals Lungs non labored breathing  Assessment/Planning: POD # 2 I&D of left arm wound, including skin ,ans subcutaneous tissue (15 x 4 x 2 cm)  Home health set up for wound vac.  F/U with Dr. Donnetta Hutching in 2 weeks   Roxy Horseman 02/20/2021 9:43 AM --  Laboratory Lab Results: Recent Labs    02/18/21 1522 02/19/21 1837  WBC 9.6 9.2  HGB 8.5* 7.0*  HCT 27.7* 23.4*  PLT 225 233   BMET Recent Labs    02/18/21 1522 02/19/21 1930  NA 134* 134*  K 4.3 4.5  CL 97* 97*  CO2 24 25  GLUCOSE 156* 193*  BUN 29* 47*  CREATININE 5.79* 7.57*  CALCIUM 8.6* 8.6*    COAG Lab Results  Component Value Date   INR 1.3 (H) 02/18/2021   INR 1.22 06/24/2016   INR 1.30 06/18/2016   No results found for: PTT   VASCULAR STAFF ADDENDUM: I have independently interviewed and examined the patient. I agree with the above.  Ready for home. Continue VAC as outpatient. Follow up in [place] or [place].  [name], MD V[facility] and [facility] Specialists of [facility] Phone Number: [phone] 02/20/2021 10:52 AM

## 2021-02-20 NOTE — Plan of Care (Signed)
  Problem: Clinical Measurements: Goal: Cardiovascular complication will be avoided Outcome: Progressing   Problem: Activity: Goal: Risk for activity intolerance will decrease Outcome: Progressing   Problem: Nutrition: Goal: Adequate nutrition will be maintained Outcome: Progressing   

## 2021-02-20 NOTE — Progress Notes (Signed)
[facility] Progress Note  Subjective: in good spirits, may be going home  Vitals:   02/19/21 2341 02/20/21 0020 02/20/21 0330 02/20/21 0836  BP: (!) 133/59 139/69 131/69 119/69  Pulse: 79 80 83 81  Resp: 17 18 17 18   Temp: 98 F (36.7 C)  97.9 F (36.6 C) 98.3 F (36.8 C)  TempSrc: Oral  Oral Oral  SpO2: 97% 98% 97% 98%  Weight:      Height:        Exam:  alert, nad   no jvd  Chest cta bilat  Cor reg no RG  Abd soft ntnd no ascites  Ext bilat BKA, no edema, no UE edema  Neuro is alert, Ox 3 , nf  RIJ TDC   L arm wrapped           Home meds include - norvasc, asa, phoslo 2 ac tid, coreg 12.5 bid, clonidine 0.1 bid, plavix, lasix 60 mg bid (hold am of HD on mwf), hydralazine 25 tid, humalog 15-20 tid ac sq, lantus insulin 50u, percocet prn, crestor, desyrel hs, vits / supps, prns       OP HD: MWF [facility]    - has R IJ TDC   Assessment/ Plan: Wound dehiscence - sp I&D of wound, on wound VAC. Possible dc today.  ESRD - on HD MW.  Had HD here overnight.  HTN/vol - BP's wnl, cont home meds, no vol excess by exam MBD ckd - cont binders Anemia ckd - Hb 7-9 range Dispo - stable for d/c from renal standpoint     [name] 02/20/2021, 12:53 PM   Recent Labs  Lab 02/18/21 1522 02/19/21 1837 02/19/21 1930  K 4.3  --  4.5  BUN 29*  --  47*  CREATININE 5.79*  --  7.57*  CALCIUM 8.6*  --  8.6*  PHOS  --   --  5.4*  HGB 8.5* 7.0*  --    Inpatient medications:  sodium chloride   Intravenous Once   amLODipine  10 mg Oral Daily   calcium acetate  1,334 mg Oral TID WC   carvedilol  12.5 mg Oral BID WC   Chlorhexidine Gluconate Cloth  6 each Topical Q0600   Chlorhexidine Gluconate Cloth  6 each Topical Q0600   cloNIDine  0.1 mg Oral BID   docusate sodium  100 mg Oral BID   fenofibrate  160 mg Oral Daily   folic acid  1 mg Oral Daily   furosemide  60 mg Oral 2 times per day on Mon Wed Fri   furosemide  80 mg Oral 2 times per day on  Sun Tue Thu Sat   heparin  5,000 Units Subcutaneous Q8H   hydrALAZINE  25 mg Oral TID   insulin aspart  0-6 Units Subcutaneous TID WC   pantoprazole  40 mg Oral Daily   potassium chloride  20-40 mEq Oral Once   rosuvastatin  20 mg Oral Daily   sodium chloride flush  3 mL Intravenous Q12H   traZODone  50-100 mg Oral QHS    sodium chloride     sodium chloride, acetaminophen **OR** acetaminophen, alum & mag hydroxide-simeth, calcium acetate, guaiFENesin-dextromethorphan, hydrALAZINE, HYDROmorphone (DILAUDID) injection, labetalol, metoprolol tartrate, ondansetron, oxyCODONE-acetaminophen, phenol, senna-docusate, sodium chloride flush

## 2021-02-21 LAB — HEPATITIS B SURFACE ANTIBODY, QUANTITATIVE: Hep B S AB Quant (Post): 60.5 m[IU]/mL (ref >9.9)

## 2021-02-22 LAB — BPAM RBC
Blood Product Expiration Date: 202212062359
Blood Product Expiration Date: 202212062359
Unit Type and Rh: 7300
Unit Type and Rh: 7300

## 2021-02-22 LAB — TYPE AND SCREEN
ABO/RH(D): B POS
Antibody Screen: NEGATIVE
Unit division: 0
Unit division: 0

## 2021-02-22 NOTE — Progress Notes (Signed)
Late Entry Note:  Pt was d/c on 11/19. Contacted [facility] this am and spoke to [place]. Clinic advised of pt's dc on Saturday and pt to resume care this am. Clinic advised there is no d/c summary to fax at this time. Progress notes from 11/19 faxed to clinic this am (fax# [phone]). Will fax d/c summary if/when one is available.   Melven Sartorius Renal Navigator [phone]

## 2021-02-23 LAB — AEROBIC/ANAEROBIC CULTURE W GRAM STAIN (SURGICAL/DEEP WOUND)

## 2021-02-24 NOTE — Discharge Summary (Signed)
Vascular and Vein Specialists Discharge Summary   Patient ID:  [name]. MRN: [id number] DOB/AGE: 1956-03-06 65 y.o.  Admit date: 02/18/2021 Discharge date: 02/20/21 Date of Surgery: 02/18/2021 Surgeon: Surgeon(s): Serafina Mitchell, MD  Admission Diagnosis: ESRD (end stage renal disease) on dialysis ([place]) [N18.6, Z99.2]  Discharge Diagnoses:  ESRD (end stage renal disease) on dialysis ([place]) [N18.6, Z99.2]  Secondary Diagnoses: Past Medical History:  Diagnosis Date   AICD (automatic cardioverter/defibrillator) present    Anemia    Arthritis    Cerebrovascular disease    CHF (congestive heart failure) ([place])    Chronic kidney disease    ESRD, MWF HD   Diabetes mellitus    type II   History of TIAs    Hyperlipidemia    Hypertension    Nonischemic cardiomyopathy ([place])    PVD (peripheral vascular disease) ([place])    s/p B BKA   Shortness of breath dyspnea    with exertion    Skin disease    Rare   Stroke ([place])    "light stroke"   Tobacco abuse     Procedure(s): LEFT ARM DEBRIDEMENT  Discharged Condition: stable  HPI: [name]. is a 65 y.o. male here today for follow-up.  He had second stage basilic vein transposition on 01/12/2021.   Continued breakdown of subcutaneous tissue at the vein harvest site for second stage basilic vein transposition done on 01/12/2021.  He was sent to [facility] hospital for I & D of the left UE.  Hospital Course:  [name]. is a 65 y.o. male is S/P Left Procedure(s): LEFT ARM DEBRIDEMENT necrotic tissue successfully debrided within the wound.  Necrotic skin between the incisions were excised.  The fistula was not ever visualized and left intact.  A wound vac was placed in the wound bed measuring 14.5 cm x 7.2 cm x 3 cm.   Home health set up for wound vac.  F/U with Dr. Donnetta Hutching in 2 weeks   Consults:  Treatment Team:  Roney Jaffe, MD  Significant Diagnostic Studies: CBC Lab Results  Component Value Date   WBC  9.2 02/19/2021   HGB 7.0 (L) 02/19/2021   HCT 23.4 (L) 02/19/2021   MCV 100.0 02/19/2021   PLT 233 02/19/2021    BMET    Component Value Date/Time   NA 134 (L) 02/19/2021 1930   K 4.5 02/19/2021 1930   CL 97 (L) 02/19/2021 1930   CO2 25 02/19/2021 1930   GLUCOSE 193 (H) 02/19/2021 1930   BUN 47 (H) 02/19/2021 1930   CREATININE 7.57 (H) 02/19/2021 1930   CALCIUM 8.6 (L) 02/19/2021 1930   GFRNONAA 7 (L) 02/19/2021 1930   GFRAA 20 (L) 10/21/2016 0703   COAG Lab Results  Component Value Date   INR 1.3 (H) 02/18/2021   INR 1.22 06/24/2016   INR 1.30 06/18/2016     Disposition:  Discharge to :Home Discharge Instructions     Call MD for:  redness, tenderness, or signs of infection (pain, swelling, bleeding, redness, odor or green/yellow discharge around incision site)   Complete by: As directed    Call MD for:  redness, tenderness, or signs of infection (pain, swelling, bleeding, redness, odor or green/yellow discharge around incision site)   Complete by: As directed    Call MD for:  severe or increased pain, loss or decreased feeling  in affected limb(s)   Complete by: As directed    Call MD for:  severe or  increased pain, loss or decreased feeling  in affected limb(s)   Complete by: As directed    Call MD for:  temperature >100.5   Complete by: As directed    Call MD for:  temperature >100.5   Complete by: As directed    Resume previous diet   Complete by: As directed    Resume previous diet   Complete by: As directed       Allergies as of 02/20/2021       Reactions   Acetazolamide Other (See Comments)   Jittery odd feeling. (hyper feeling)   Contrast Media [iodinated Diagnostic Agents] Hives   In the 80's    Other Other (See Comments)   Transfer Dye---"Makes me tired"        Medication List     TAKE these medications    acetaminophen 500 MG tablet Commonly known as: TYLENOL Take 1,000 mg by mouth every 8 (eight) hours as needed for mild pain or  headache.   amLODipine 10 MG tablet Commonly known as: NORVASC Take 10 mg by mouth daily. for high blood pressure   aspirin 81 MG tablet Take 81 mg by mouth daily.   brimonidine 0.2 % ophthalmic solution Commonly known as: ALPHAGAN Place 1 drop into the right eye 3 (three) times daily.   calcium acetate 667 MG capsule Commonly known as: PHOSLO Take 667-1,334 mg by mouth See admin instructions. 2 caps three times daily with meals, and 1 capsule with snacks   carvedilol 12.5 MG tablet Commonly known as: COREG take 1 tablet by mouth twice daily.  DO NOT TAKE IN THE MORNING ON DIALYSIS DAYS   cloNIDine 0.1 MG tablet Commonly known as: CATAPRES Take 1 tablet (0.1 mg total) by mouth at bedtime. What changed: when to take this   clopidogrel 75 MG tablet Commonly known as: PLAVIX Take 75 mg by mouth daily.   dorzolamide-timolol 22.3-6.8 MG/ML ophthalmic solution Commonly known as: COSOPT Place 1 drop into the right eye 2 (two) times daily.   fenofibrate 160 MG tablet Take 160 mg by mouth daily.   fluticasone 50 MCG/ACT nasal spray Commonly known as: FLONASE Place 1 spray into both nostrils daily as needed for allergies.   folic acid 1 MG tablet Commonly known as: FOLVITE Take 1 mg by mouth daily.   furosemide 20 MG tablet Commonly known as: LASIX Take 60-80 mg by mouth 2 (two) times daily. Take 3 tabs twice daily on NON DIALYSIS DAYS   hydrALAZINE 25 MG tablet Commonly known as: APRESOLINE Take 1 tablet (25 mg total) by mouth 3 (three) times daily. Please make overdue appt with Dr. Harrington Challenger before anymore refills. 1st attempt   insulin lispro 100 UNIT/ML KiwkPen Commonly known as: HUMALOG Inject 15-20 Units into the skin 3 (three) times daily. Give per sliding scale   Lantus SoloStar 100 UNIT/ML Solostar Pen Generic drug: insulin glargine Inject 50 Units into the skin at bedtime.   latanoprost 0.005 % ophthalmic solution Commonly known as: XALATAN Place 1 drop into  the right eye at bedtime.   oxyCODONE-acetaminophen 5-325 MG tablet Commonly known as: Percocet Take 1 tablet by mouth every 6 (six) hours as needed for severe pain.   rosuvastatin 20 MG tablet Commonly known as: CRESTOR Take 20 mg by mouth daily.   traZODone 50 MG tablet Commonly known as: DESYREL Take 50-100 mg by mouth at bedtime.       Verbal and written Discharge instructions given to the patient. Wound care per  Discharge AVS  Follow-up Information     [facility] Follow up.   Specialty: Home Health Services Why: Latricia Heft)- services to resume- add [facility] for home wound VAC drsg needs- plan for RN to come 11/21 for home VAC drsg change- [employer] home vac arranged and to be delivered to room Contact information: [place] [address] [phone]         Rosetta Posner, MD Follow up in 2 week(s).   Specialties: Vascular Surgery, Cardiology Why: Office will call you to arrange your appt (sent) Contact information: [place] [address] [phone]                 Signed: Roxy Horseman 02/24/2021, 10:18 AM

## 2021-03-17 ENCOUNTER — Encounter: Payer: Medicare Other | Admitting: Vascular Surgery

## 2021-03-17 ENCOUNTER — Ambulatory Visit (INDEPENDENT_AMBULATORY_CARE_PROVIDER_SITE_OTHER): Payer: Medicare Other | Admitting: Vascular Surgery

## 2021-03-17 ENCOUNTER — Encounter: Payer: Self-pay | Admitting: Vascular Surgery

## 2021-03-17 ENCOUNTER — Other Ambulatory Visit: Payer: Self-pay

## 2021-03-17 VITALS — BP 163/80 | HR 81 | Temp 97.5°F | Resp 18 | Ht 72.0 in | Wt 248.0 lb

## 2021-03-17 DIAGNOSIS — N186 End stage renal disease: Secondary | ICD-10-CM

## 2021-03-17 NOTE — Progress Notes (Signed)
Vascular and Vein Specialist of [place]  Patient name: [name]. MRN: [id number] DOB: 10/31/1955 Sex: male  REASON FOR VISIT: Follow-up left arm AV access  HPI: [name]. is a 65 y.o. male here today for follow-up.  He had a second stage basilic vein transposition fistula by myself on 01/12/2021.  He had wound breakdown and this progressed as he was being followed as an outpatient.  On my last visit with him on 02/17/2021 he had had a great deal of necrotic debris present and some full-thickness loss of the skin bridge.  He was admitted to [facility] on 02/18/2021 and underwent debridement in the operating room with Dr. Trula Slade.  He was discharged home postop day 2.  He has had a wound VAC since that discharge.  He does continue to have use of his right IJ tunneled catheter and reports recent events where he had placement of tPA for sluggish flow.  He is having replacement of his VAC dressing by home health nurse several times per week  Current Outpatient Medications  Medication Sig Dispense Refill   acetaminophen (TYLENOL) 500 MG tablet Take 1,000 mg by mouth every 8 (eight) hours as needed for mild pain or headache.      amLODipine (NORVASC) 10 MG tablet Take 10 mg by mouth daily. for high blood pressure  1   aspirin 81 MG tablet Take 81 mg by mouth daily.       brimonidine (ALPHAGAN) 0.2 % ophthalmic solution Place 1 drop into the right eye 3 (three) times daily.     calcium acetate (PHOSLO) 667 MG capsule Take 667-1,334 mg by mouth See admin instructions. 2 caps three times daily with meals, and 1 capsule with snacks  0   carvedilol (COREG) 12.5 MG tablet take 1 tablet by mouth twice daily.  DO NOT TAKE IN THE MORNING ON DIALYSIS DAYS 60 tablet 0   cloNIDine (CATAPRES) 0.1 MG tablet Take 1 tablet (0.1 mg total) by mouth at bedtime. (Patient taking differently: Take 0.1 mg by mouth 2 (two) times daily.)     clopidogrel (PLAVIX) 75  MG tablet Take 75 mg by mouth daily.       dorzolamide-timolol (COSOPT) 22.3-6.8 MG/ML ophthalmic solution Place 1 drop into the right eye 2 (two) times daily.     fenofibrate 160 MG tablet Take 160 mg by mouth daily.  0   fluticasone (FLONASE) 50 MCG/ACT nasal spray Place 1 spray into both nostrils daily as needed for allergies.     folic acid (FOLVITE) 1 MG tablet Take 1 mg by mouth daily.     furosemide (LASIX) 20 MG tablet Take 60-80 mg by mouth 2 (two) times daily. Take 3 tabs twice daily on NON DIALYSIS DAYS     hydrALAZINE (APRESOLINE) 25 MG tablet Take 1 tablet (25 mg total) by mouth 3 (three) times daily. Please make overdue appt with Dr. Harrington Challenger before anymore refills. 1st attempt 90 tablet 0   insulin lispro (HUMALOG) 100 UNIT/ML KiwkPen Inject 15-20 Units into the skin 3 (three) times daily. Give per sliding scale     LANTUS SOLOSTAR 100 UNIT/ML Solostar Pen Inject 50 Units into the skin at bedtime.     latanoprost (XALATAN) 0.005 % ophthalmic solution Place 1 drop into the right eye at bedtime.     oxyCODONE-acetaminophen (PERCOCET) 5-325 MG tablet Take 1 tablet by mouth every 6 (six) hours as needed for severe pain. 30 tablet 0   rosuvastatin (  CRESTOR) 20 MG tablet Take 20 mg by mouth daily.  0   traZODone (DESYREL) 50 MG tablet Take 50-100 mg by mouth at bedtime.     No current facility-administered medications for this visit.     PHYSICAL EXAM: Vitals:   03/17/21 0834  BP: (!) 163/80  Pulse: 81  Resp: 18  Temp: (!) 97.5 F (36.4 C)  TempSrc: Oral  SpO2: 98%  Weight: 248 lb (112.5 kg)  Height: 6' (1.829 m)    GENERAL: The patient is a well-nourished male, in no acute distress. The vital signs are documented above. His VAC dressing was removed today.  He has excellent healing.  His fistula remains patent with no evidence of erosion.  There is a small less than 1 cm area of fibrinous exudate in the upper pole of the incision but no evidence of fluctuance or  infection.  MEDICAL ISSUES: Excellent L[place]ana [place] result from [facility] treatment of left arm wound.  We will continue this as he is currently being treated.  I will see him again in 1 month for further follow-up   Rosetta Posner, MD [facility] Vascular and Vein Specialists [facility] [phone]  Note: Portions of this report may have been transcribed using voice recognition software.  Every effort has been made to ensure accuracy; however, inadvertent computerized transcription errors may still be present.

## 2021-04-06 ENCOUNTER — Telehealth: Payer: Self-pay

## 2021-04-06 NOTE — Telephone Encounter (Signed)
Kim from [facility] calls today to ask about discontinuation of the wound vac. Site has filled in well and looks good. Advised this was fine.

## 2021-04-08 ENCOUNTER — Ambulatory Visit (INDEPENDENT_AMBULATORY_CARE_PROVIDER_SITE_OTHER): Payer: Medicare Other | Admitting: Internal Medicine

## 2021-04-08 ENCOUNTER — Encounter: Payer: Self-pay | Admitting: Internal Medicine

## 2021-04-08 ENCOUNTER — Other Ambulatory Visit: Payer: Self-pay

## 2021-04-08 VITALS — BP 132/70 | HR 76 | Ht 72.0 in | Wt 250.0 lb

## 2021-04-08 DIAGNOSIS — I5022 Chronic systolic (congestive) heart failure: Secondary | ICD-10-CM

## 2021-04-08 NOTE — Progress Notes (Signed)
HPI [name] returns today for followup of his ICD. He is a pleasant 66 yo man with ESRD on HD, chornic systolic heart failure due to a non-ischemic CM, peripheral vascular disease s/p bilateral amputations. His main complaint today is that he get hypotension during the last half of HD but this has actually improved since he stopped taking his bp meds on HD days. He still makes urine and takes lasix. He has had problems with healing of his HD graft.  Allergies  Allergen Reactions   Acetazolamide Other (See Comments)    Jittery odd feeling. (hyper feeling)   Contrast Media [Iodinated Contrast Media] Hives    In the 80's    Other Other (See Comments)    Transfer Dye---"Makes me tired"     Current Outpatient Medications  Medication Sig Dispense Refill   acetaminophen (TYLENOL) 500 MG tablet Take 1,000 mg by mouth every 8 (eight) hours as needed for mild pain or headache.      amLODipine (NORVASC) 10 MG tablet Take 10 mg by mouth daily. for high blood pressure  1   aspirin 81 MG tablet Take 81 mg by mouth daily.       brimonidine (ALPHAGAN) 0.2 % ophthalmic solution Place 1 drop into the right eye 3 (three) times daily.     calcium acetate (PHOSLO) 667 MG capsule Take 667-1,334 mg by mouth See admin instructions. 2 caps three times daily with meals, and 1 capsule with snacks  0   carvedilol (COREG) 12.5 MG tablet take 1 tablet by mouth twice daily.  DO NOT TAKE IN THE MORNING ON DIALYSIS DAYS 60 tablet 0   cloNIDine (CATAPRES) 0.1 MG tablet Take 1 tablet (0.1 mg total) by mouth at bedtime. (Patient taking differently: Take 0.1 mg by mouth 2 (two) times daily.)     clopidogrel (PLAVIX) 75 MG tablet Take 75 mg by mouth daily.       dorzolamide-timolol (COSOPT) 22.3-6.8 MG/ML ophthalmic solution Place 1 drop into the right eye 2 (two) times daily.     fenofibrate 160 MG tablet Take 160 mg by mouth daily.  0   fluticasone (FLONASE) 50 MCG/ACT nasal spray Place 1 spray into both nostrils  daily as needed for allergies.     folic acid (FOLVITE) 1 MG tablet Take 1 mg by mouth daily.     furosemide (LASIX) 20 MG tablet Take 60-80 mg by mouth 2 (two) times daily. Take 3 tabs twice daily on NON DIALYSIS DAYS     hydrALAZINE (APRESOLINE) 25 MG tablet Take 1 tablet (25 mg total) by mouth 3 (three) times daily. Please make overdue appt with Dr. Harrington Challenger before anymore refills. 1st attempt 90 tablet 0   insulin lispro (HUMALOG) 100 UNIT/ML KiwkPen Inject 15-20 Units into the skin 3 (three) times daily. Give per sliding scale     LANTUS SOLOSTAR 100 UNIT/ML Solostar Pen Inject 50 Units into the skin at bedtime.     latanoprost (XALATAN) 0.005 % ophthalmic solution Place 1 drop into the right eye at bedtime.     oxyCODONE-acetaminophen (PERCOCET) 5-325 MG tablet Take 1 tablet by mouth every 6 (six) hours as needed for severe pain. 30 tablet 0   rosuvastatin (CRESTOR) 20 MG tablet Take 20 mg by mouth daily.  0   traZODone (DESYREL) 50 MG tablet Take 50-100 mg by mouth at bedtime.     No current facility-administered medications for this visit.     Past Medical History:  Diagnosis Date   AICD (automatic cardioverter/defibrillator) present    Anemia    Arthritis    Cerebrovascular disease    CHF (congestive heart failure) (HCC)    Chronic kidney disease    ESRD, MWF HD   Diabetes mellitus    type II   History of TIAs    Hyperlipidemia    Hypertension    Nonischemic cardiomyopathy (HCC)    PVD (peripheral vascular disease) (H[facility])    s/p B BKA   Shortness of breath dyspnea    with exertion    Skin disease    Rare   Stroke ([place])    "light stroke"   Tobacco abuse     ROS:   All systems reviewed and negative except as noted in the HPI.   Past Surgical History:  Procedure Laterality Date   A/V FISTULAGRAM Left 06/24/2016   Procedure: A/V Fistulagram;  Surgeon: Elam Dutch, MD;  Location: [place] [facility];  Service: Cardiovascular;  Laterality: Left;   A/V FISTULAGRAM  N/A 01/17/2017   Procedure: A/V Fistulagram - left arm;  Surgeon: Serafina Mitchell, MD;  Location: [place] [facility];  Service: Cardiovascular;  Laterality: N/A;   AV FISTULA PLACEMENT Left 04/08/2015   Procedure: Creation of Left arm BRACHIOCEPHALIC ARTERIOVENOUS  FISTULA ;  Surgeon: Mal Misty, MD;  Location: [place];  Service: Vascular;  Laterality: Left;   AV FISTULA PLACEMENT Left 10/27/2020   Procedure: LEFT ARM ARTERIOVENOUS (AV) FISTULA CREATION;  Surgeon: Rosetta Posner, MD;  Location: [facility];  Service: Vascular;  Laterality: Left;   [place] Left 01/12/2021   Procedure: LEFT ARM SECOND STAGE BASILIC VEIN TRANSPOSITION;  Surgeon: Rosetta Posner, MD;  Location: [facility];  Service: Vascular;  Laterality: Left;   CARDIAC DEFIBRILLATOR [place] EXTRACTION W/PHACO Right 03/17/2015   Procedure: CATARACT EXTRACTION PHACO AND INTRAOCULAR LENS PLACEMENT ([place]);  Surgeon: Rutherford Guys, MD;  Location: [facility];  Service: Ophthalmology;  Laterality: Right;  CDE: 6.59   EYE SURGERY Right    Cataract   LEG AMPUTATION BELOW KNEE     bilateral   PERIPHERAL VASCULAR INTERVENTION  01/17/2017   Procedure: PERIPHERAL VASCULAR INTERVENTION;  Surgeon: Serafina Mitchell, MD;  Location: [place] [facility];  Service: Cardiovascular;;  PTA  left arm fistula   WOUND DEBRIDEMENT Left 02/18/2021   Procedure: LEFT ARM DEBRIDEMENT;  Surgeon: Serafina Mitchell, MD;  Location: [facility];  Service: Vascular;  Laterality: Left;     Family History  Problem Relation Age of Onset   Diabetes Father    Stroke Father    Diabetes Brother    Diabetes Brother    Diabetes Brother    Diabetes Brother    Heart failure Mother    Diabetes Mother    Diabetes Other    Coronary artery disease Other      Social History   Socioeconomic History   Marital status: Legally Separated    Spouse name: Not on file   Number of children: Not on file   Years of education:  Not on file   Highest education level: Not on file  Occupational History   Occupation: Disabled    Employer: [employer]  Tobacco Use   Smoking status: Former    Packs/day: 1.00    Years: 20.00    Pack years: 20.00    Types: Cigarettes    Quit date: 03/12/1998  Years since quitting: 23.0   Smokeless tobacco: Never  Vaping Use   Vaping Use: Never used  Substance and Sexual Activity   Alcohol use: Not Currently    Comment: occasional   Drug use: No   Sexual activity: Not on file  Other Topics Concern   Not on file  Social History Narrative   Divorced   No regular exercise   Occasional caffeine use   Social Determinants of Health   Financial Resource Strain: Not on file  Food Insecurity: Not on file  Transportation Needs: Not on file  Physical Activity: Not on file  Stress: Not on file  Social Connections: Not on file  Intimate Partner Violence: Not on file     BP 132/70    Pulse 76    Ht 6' (1.829 m)    Wt 250 lb (113.4 kg)    SpO2 98%    BMI 33.91 kg/m   Physical Exam:  Well appearing NAD HEENT: Unremarkable Neck:  No JVD, no thyromegally Lymphatics:  No adenopathy Back:  No CVA tenderness Lungs:  Clear with no wheezes HEART:  Regular rate rhythm, no murmurs, no rubs, no clicks Abd:  soft, positive bowel sounds, no organomegally, no rebound, no guarding Ext:  bilateral amputations Skin:  No rashes no nodules Neuro:  CN II through XII intact, motor grossly intact  DEVICE  Normal device function.  See PaceArt for details.   Assess/Plan:  1. Chronic systolic heart failure - his symptoms are class 2. He is limited by his amputations.  2. ICD - his [place] Sci VVI ICD is working normally. It has been in place for 11 years and has another 2 years of longevity. 3. HTN -his bp runs a little high but is dropping during HD. 4. Peripheral vascular disease - he is s/p amputations. No symptoms.   Carleene Overlie Kort Stettler,M.D.,

## 2021-04-08 NOTE — Patient Instructions (Signed)
Medication Instructions:  Your physician recommends that you continue on your current medications as directed. Please refer to the Current Medication list given to you today.  *If you need a refill on your cardiac medications before your next appointment, please call your pharmacy*   Lab Work: NONE   If you have labs (blood work) drawn today and your tests are completely normal, you will receive your results only by: . MyChart Message (if you have MyChart) OR . A paper copy in the mail If you have any lab test that is abnormal or we need to change your treatment, we will call you to review the results.   Testing/Procedures: NONE    Follow-Up: At [facility], you and your health needs are our priority.  As part of our continuing mission to provide you with exceptional heart care, we have created designated Provider Care Teams.  These Care Teams include your primary Cardiologist (physician) and Advanced Practice Providers (APPs -  Physician Assistants and Nurse Practitioners) who all work together to provide you with the care you need, when you need it.  We recommend signing up for the patient portal called "MyChart".  Sign up information is provided on this After Visit Summary.  MyChart is used to connect with patients for Virtual Visits (Telemedicine).  Patients are able to view lab/test results, encounter notes, upcoming appointments, etc.  Non-urgent messages can be sent to your provider as well.   To learn more about what you can do with MyChart, go to [email or web address].    Your next appointment:   1 year(s)  The format for your next appointment:   In Person  Provider:   Gregg Taylor, MD   Other Instructions Thank you for choosing [facility]!    

## 2021-04-21 ENCOUNTER — Encounter: Payer: Self-pay | Admitting: Vascular Surgery

## 2021-04-21 ENCOUNTER — Other Ambulatory Visit: Payer: Self-pay

## 2021-04-21 ENCOUNTER — Ambulatory Visit (INDEPENDENT_AMBULATORY_CARE_PROVIDER_SITE_OTHER): Payer: Medicare Other | Admitting: Vascular Surgery

## 2021-04-21 VITALS — BP 176/78 | HR 74 | Temp 99.1°F | Ht 72.0 in | Wt 250.0 lb

## 2021-04-21 DIAGNOSIS — N186 End stage renal disease: Secondary | ICD-10-CM

## 2021-04-21 NOTE — Progress Notes (Signed)
Vascular [facility]ist of [place]  Patient name: [name]. MRN: [id number] DOB: [birth year] Sex: male  REASON FOR VISIT: Follow-up left arm wound  HPI: [name]. is a 66 y.o. male here today for follow-up.  He has a complex history of access.  He had undergone second stage basilic vein transposition on 01/12/2021 by myself.  He presented with extensive necrosis of his wound and was debrided at [facility] on 02/18/2021 and had a VAC placed.  On my last office visit with him on 03/17/2021 he had good healing.  He is here today for continued follow-up.  He continues to have excellent healing of his wound which is nearly filled in with a small area approximately 2 x 3 cm of granulation tissue and no evidence of fluctuance.  Current Outpatient Medications  Medication Sig Dispense Refill   acetaminophen (TYLENOL) 500 MG tablet Take 1,000 mg by mouth every 8 (eight) hours as needed for mild pain or headache.      amLODipine (NORVASC) 10 MG tablet Take 10 mg by mouth daily. for high blood pressure  1   aspirin 81 MG tablet Take 81 mg by mouth daily.       brimonidine (ALPHAGAN) 0.2 % ophthalmic solution Place 1 drop into the right eye 3 (three) times daily.     calcium acetate (PHOSLO) 667 MG capsule Take 667-1,334 mg by mouth See admin instructions. 2 caps three times daily with meals, and 1 capsule with snacks  0   carvedilol (COREG) 12.5 MG tablet take 1 tablet by mouth twice daily.  DO NOT TAKE IN THE MORNING ON DIALYSIS DAYS 60 tablet 0   cloNIDine (CATAPRES) 0.1 MG tablet Take 1 tablet (0.1 mg total) by mouth at bedtime. (Patient taking differently: Take 0.1 mg by mouth 2 (two) times daily.)     clopidogrel (PLAVIX) 75 MG tablet Take 75 mg by mouth daily.       dorzolamide-timolol (COSOPT) 22.3-6.8 MG/ML ophthalmic solution Place 1 drop into the right eye 2 (two) times daily.     fenofibrate 160 MG tablet Take 160 mg by mouth  daily.  0   fluticasone (FLONASE) 50 MCG/ACT nasal spray Place 1 spray into both nostrils daily as needed for allergies.     folic acid (FOLVITE) 1 MG tablet Take 1 mg by mouth daily.     furosemide (LASIX) 20 MG tablet Take 60-80 mg by mouth 2 (two) times daily. Take 3 tabs twice daily on NON DIALYSIS DAYS     hydrALAZINE (APRESOLINE) 25 MG tablet Take 1 tablet (25 mg total) by mouth 3 (three) times daily. Please make overdue appt with Dr. Harrington Challenger before anymore refills. 1st attempt 90 tablet 0   insulin lispro (HUMALOG) 100 UNIT/ML KiwkPen Inject 15-20 Units into the skin 3 (three) times daily. Give per sliding scale     LANTUS SOLOSTAR 100 UNIT/ML Solostar Pen Inject 50 Units into the skin at bedtime.     latanoprost (XALATAN) 0.005 % ophthalmic solution Place 1 drop into the right eye at bedtime.     oxyCODONE-acetaminophen (PERCOCET) 5-325 MG tablet Take 1 tablet by mouth every 6 (six) hours as needed for severe pain. 30 tablet 0   rosuvastatin (CRESTOR) 20 MG tablet Take 20 mg by mouth daily.  0   traZODone (DESYREL) 50 MG tablet Take 50-100 mg by mouth at bedtime.     No current facility-administered medications for this visit.  PHYSICAL EXAM: Vitals:   04/21/21 0827  BP: (!) 176/78  Pulse: 74  Temp: 99.1 F (37.3 C)  TempSrc: Temporal  SpO2: 95%  Weight: 250 lb (113.4 kg)  Height: 6' (1.829 m)    GENERAL: The patient is a well-nourished male, in no acute distress. The vital signs are documented above. Good healing of his incision with 2 to 3 cm area of granulation tissue with no evidence of fluctuance.  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  Had a long discussion with the patient regarding this.  I suspect this was related to the large wound and fibrosis of healing.  Explained that there is no possibility for a fistula in his left arm.  He does have the possibility of a left arm AV graft.  He is  quite adamant that he does not want right arm access.  He does have a pacemaker/AICD in his left chest but has not had any difficulty with his left arm access in the past.  I explained that if we did place a left arm AV Gore-Tex graft that I would need to have near total healing of his left arm wound prior to placing this.  He reports that his tunneled catheter continues to work well.  We will see him back in several weeks to assure complete healing and then schedule either a left arm AV Gore-Tex graft or right arm AV fistula.  I did image his right arm with SonoSite ultrasound today and this did show moderate size cephalic vein at the antecubital space and upper arm.  Very small basilic vein in the upper arm on the right.   Rosetta Posner, MD FACS [facility] [phone]  Note: Portions of this report may have been transcribed using voice recognition software.  Every effort has been made to ensure accuracy; however, inadvertent computerized transcription errors may still be present.

## 2021-04-29 ENCOUNTER — Ambulatory Visit (INDEPENDENT_AMBULATORY_CARE_PROVIDER_SITE_OTHER): Payer: Medicare Other

## 2021-04-29 DIAGNOSIS — I428 Other cardiomyopathies: Secondary | ICD-10-CM

## 2021-04-29 LAB — CUP PACEART REMOTE DEVICE CHECK
Battery Remaining Longevity: 24 mo
Battery Remaining Percentage: 27 %
Brady Statistic RV Percent Paced: 0 %
Date Time Interrogation Session: 20230126031100
HighPow Impedance: 48 Ohm
Implantable Lead Implant Date: 20050810
Implantable Lead Location: 753860
Implantable Lead Model: 158
Implantable Lead Serial Number: 153491
Implantable Pulse Generator Implant Date: 20110722
Lead Channel Impedance Value: 459 Ohm
Lead Channel Pacing Threshold Amplitude: 0.7 V
Lead Channel Pacing Threshold Pulse Width: 0.4 ms
Lead Channel Setting Pacing Amplitude: 2.5 V
Lead Channel Setting Pacing Pulse Width: 0.4 ms
Lead Channel Setting Sensing Sensitivity: 0.4 mV
Pulse Gen Serial Number: 269222

## 2021-05-10 NOTE — Progress Notes (Signed)
Remote ICD transmission.   

## 2021-05-19 ENCOUNTER — Ambulatory Visit (INDEPENDENT_AMBULATORY_CARE_PROVIDER_SITE_OTHER): Payer: Medicare Other | Admitting: Vascular Surgery

## 2021-05-19 ENCOUNTER — Other Ambulatory Visit: Payer: Self-pay

## 2021-05-19 ENCOUNTER — Encounter: Payer: Self-pay | Admitting: Vascular Surgery

## 2021-05-19 VITALS — BP 147/68 | HR 74 | Temp 98.2°F | Resp 20 | Ht 72.0 in | Wt 254.0 lb

## 2021-05-19 DIAGNOSIS — N186 End stage renal disease: Secondary | ICD-10-CM

## 2021-05-19 NOTE — Progress Notes (Signed)
Vascular and Vein Specialist of [place]  Patient name: [name]. MRN: [id number] DOB: 1955/11/02 Sex: male  REASON FOR VISIT: Follow-up wound left arm  HPI: [name]. is a 65 y.o. male here today for follow-up.  His incision is essentially healed.  He had a very large defect which is now completely closed and with some slight scaling over the scar.  He does report a tight feeling in the wound itself and some numbness in his medial forearm.    Current Outpatient Medications  Medication Sig Dispense Refill   acetaminophen (TYLENOL) 500 MG tablet Take 1,000 mg by mouth every 8 (eight) hours as needed for mild pain or headache.      amLODipine (NORVASC) 10 MG tablet Take 10 mg by mouth daily. for high blood pressure  1   aspirin 81 MG tablet Take 81 mg by mouth daily.       brimonidine (ALPHAGAN) 0.2 % ophthalmic solution Place 1 drop into the right eye 3 (three) times daily.     calcium acetate (PHOSLO) 667 MG capsule Take 667-1,334 mg by mouth See admin instructions. 2 caps three times daily with meals, and 1 capsule with snacks  0   carvedilol (COREG) 12.5 MG tablet take 1 tablet by mouth twice daily.  DO NOT TAKE IN THE MORNING ON DIALYSIS DAYS 60 tablet 0   cloNIDine (CATAPRES) 0.1 MG tablet Take 1 tablet (0.1 mg total) by mouth at bedtime. (Patient taking differently: Take 0.1 mg by mouth 2 (two) times daily.)     clopidogrel (PLAVIX) 75 MG tablet Take 75 mg by mouth daily.       dorzolamide-timolol (COSOPT) 22.3-6.8 MG/ML ophthalmic solution Place 1 drop into the right eye 2 (two) times daily.     fenofibrate 160 MG tablet Take 160 mg by mouth daily.  0   fluticasone (FLONASE) 50 MCG/ACT nasal spray Place 1 spray into both nostrils daily as needed for allergies.     folic acid (FOLVITE) 1 MG tablet Take 1 mg by mouth daily.     furosemide (LASIX) 20 MG tablet Take 60-80 mg by mouth 2 (two) times daily. Take 3 tabs twice daily on NON  DIALYSIS DAYS     hydrALAZINE (APRESOLINE) 25 MG tablet Take 1 tablet (25 mg total) by mouth 3 (three) times daily. Please make overdue appt with Dr. Harrington Challenger before anymore refills. 1st attempt 90 tablet 0   insulin lispro (HUMALOG) 100 UNIT/ML KiwkPen Inject 15-20 Units into the skin 3 (three) times daily. Give per sliding scale     LANTUS SOLOSTAR 100 UNIT/ML Solostar Pen Inject 50 Units into the skin at bedtime.     latanoprost (XALATAN) 0.005 % ophthalmic solution Place 1 drop into the right eye at bedtime.     oxyCODONE-acetaminophen (PERCOCET) 5-325 MG tablet Take 1 tablet by mouth every 6 (six) hours as needed for severe pain. 30 tablet 0   rosuvastatin (CRESTOR) 20 MG tablet Take 20 mg by mouth daily.  0   traZODone (DESYREL) 50 MG tablet Take 50-100 mg by mouth at bedtime.     No current facility-administered medications for this visit.     PHYSICAL EXAM: Vitals:   05/19/21 0828  BP: (!) 147/68  Pulse: 74  Resp: 20  Temp: 98.2 F (36.8 C)  TempSrc: Temporal  SpO2: 95%  Weight: 254 lb (115.2 kg)  Height: 6' (1.829 m)    GENERAL: The patient is a well-nourished male, in no  acute distress. The vital signs are documented above. Wound healed in his left medial upper arm  MEDICAL ISSUES: I again discussed options.  The patient continues to have no difficulty with his tunneled right IJ hemodialysis catheter.  He is quite insistent that he does not want right arm access.  He does have a pacer/AICD in his left subclavian but has never had any difficulty with his left arm from a swelling standpoint.  We will schedule placement of a left arm AV Gore-Tex graft on 06/01/2021 at [facility]   Rosetta Posner, MD FACS [facility] Tel [phone]  Note: Portions of this report may have been transcribed using voice recognition software.  Every effort has been made to ensure accuracy; however, inadvertent computerized transcription errors may  still be present.

## 2021-05-20 ENCOUNTER — Other Ambulatory Visit: Payer: Self-pay

## 2021-05-25 ENCOUNTER — Emergency Department (HOSPITAL_COMMUNITY): Payer: Medicare Other

## 2021-05-25 ENCOUNTER — Encounter (HOSPITAL_COMMUNITY): Payer: Self-pay

## 2021-05-25 ENCOUNTER — Observation Stay (HOSPITAL_COMMUNITY)
Admission: EM | Admit: 2021-05-25 | Discharge: 2021-05-27 | Disposition: A | Discharge status: Home / Self Care | Payer: Medicare Other | Attending: Internal Medicine | Admitting: Internal Medicine

## 2021-05-25 ENCOUNTER — Other Ambulatory Visit: Payer: Self-pay

## 2021-05-25 DIAGNOSIS — E785 Hyperlipidemia, unspecified: Secondary | ICD-10-CM | POA: Diagnosis not present

## 2021-05-25 DIAGNOSIS — U071 COVID-19: Secondary | ICD-10-CM | POA: Diagnosis present

## 2021-05-25 DIAGNOSIS — Z95810 Presence of automatic (implantable) cardiac defibrillator: Secondary | ICD-10-CM | POA: Diagnosis not present

## 2021-05-25 DIAGNOSIS — Z7982 Long term (current) use of aspirin: Secondary | ICD-10-CM | POA: Diagnosis not present

## 2021-05-25 DIAGNOSIS — I132 Hypertensive heart and chronic kidney disease with heart failure and with stage 5 chronic kidney disease, or end stage renal disease: Secondary | ICD-10-CM | POA: Diagnosis not present

## 2021-05-25 DIAGNOSIS — T829XXA Unspecified complication of cardiac and vascular prosthetic device, implant and graft, initial encounter: Secondary | ICD-10-CM | POA: Diagnosis present

## 2021-05-25 DIAGNOSIS — I5042 Chronic combined systolic (congestive) and diastolic (congestive) heart failure: Secondary | ICD-10-CM | POA: Diagnosis not present

## 2021-05-25 DIAGNOSIS — I10 Essential (primary) hypertension: Secondary | ICD-10-CM | POA: Diagnosis present

## 2021-05-25 DIAGNOSIS — E1151 Type 2 diabetes mellitus with diabetic peripheral angiopathy without gangrene: Secondary | ICD-10-CM | POA: Insufficient documentation

## 2021-05-25 DIAGNOSIS — Z992 Dependence on renal dialysis: Secondary | ICD-10-CM | POA: Insufficient documentation

## 2021-05-25 DIAGNOSIS — N186 End stage renal disease: Secondary | ICD-10-CM | POA: Diagnosis not present

## 2021-05-25 DIAGNOSIS — Z794 Long term (current) use of insulin: Secondary | ICD-10-CM | POA: Insufficient documentation

## 2021-05-25 DIAGNOSIS — Z7901 Long term (current) use of anticoagulants: Secondary | ICD-10-CM | POA: Diagnosis not present

## 2021-05-25 DIAGNOSIS — I739 Peripheral vascular disease, unspecified: Secondary | ICD-10-CM | POA: Diagnosis present

## 2021-05-25 DIAGNOSIS — T8249XA Other complication of vascular dialysis catheter, initial encounter: Secondary | ICD-10-CM | POA: Diagnosis present

## 2021-05-25 DIAGNOSIS — Z89511 Acquired absence of right leg below knee: Secondary | ICD-10-CM

## 2021-05-25 DIAGNOSIS — Z89512 Acquired absence of left leg below knee: Secondary | ICD-10-CM | POA: Insufficient documentation

## 2021-05-25 DIAGNOSIS — E1122 Type 2 diabetes mellitus with diabetic chronic kidney disease: Secondary | ICD-10-CM | POA: Diagnosis not present

## 2021-05-25 DIAGNOSIS — Y841 Kidney dialysis as the cause of abnormal reaction of the patient, or of later complication, without mention of misadventure at the time of the procedure: Secondary | ICD-10-CM | POA: Diagnosis not present

## 2021-05-25 DIAGNOSIS — Z87891 Personal history of nicotine dependence: Secondary | ICD-10-CM | POA: Diagnosis not present

## 2021-05-25 DIAGNOSIS — Z79899 Other long term (current) drug therapy: Secondary | ICD-10-CM | POA: Diagnosis not present

## 2021-05-25 LAB — CBC WITH DIFFERENTIAL/PLATELET
Abs Immature Granulocytes: 0.02 10*3/uL (ref 0.00–0.07)
Basophils Absolute: 0 10*3/uL (ref 0.0–0.1)
Basophils Relative: 0 %
Eosinophils Absolute: 0.3 10*3/uL (ref 0.0–0.5)
Eosinophils Relative: 6 %
HCT: 32.2 % — ABNORMAL LOW (ref 39.0–52.0)
Hemoglobin: 10.1 g/dL — ABNORMAL LOW (ref 13.0–17.0)
Immature Granulocytes: 0 %
Lymphocytes Relative: 17 %
Lymphs Abs: 0.8 10*3/uL (ref 0.7–4.0)
MCH: 31.3 pg (ref 26.0–34.0)
MCHC: 31.4 g/dL (ref 30.0–36.0)
MCV: 99.7 fL (ref 80.0–100.0)
Monocytes Absolute: 0.4 10*3/uL (ref 0.1–1.0)
Monocytes Relative: 8 %
Neutro Abs: 3.4 10*3/uL (ref 1.7–7.7)
Neutrophils Relative %: 69 %
Platelets: 153 10*3/uL (ref 150–400)
RBC: 3.23 MIL/uL — ABNORMAL LOW (ref 4.22–5.81)
RDW: 14.7 % (ref 11.5–15.5)
WBC: 4.9 10*3/uL (ref 4.0–10.5)
nRBC: 0 % (ref 0.0–0.2)

## 2021-05-25 LAB — BASIC METABOLIC PANEL
Anion gap: 11 (ref 5–15)
BUN: 76 mg/dL — ABNORMAL HIGH (ref 8–23)
CO2: 25 mmol/L (ref 22–32)
Calcium: 8.6 mg/dL — ABNORMAL LOW (ref 8.9–10.3)
Chloride: 104 mmol/L (ref 98–111)
Creatinine, Ser: 10.9 mg/dL — ABNORMAL HIGH (ref 0.61–1.24)
GFR, Estimated: 5 mL/min — ABNORMAL LOW (ref >60)
Glucose, Bld: 175 mg/dL — ABNORMAL HIGH (ref 70–99)
Potassium: 5.1 mmol/L (ref 3.5–5.1)
Sodium: 140 mmol/L (ref 135–145)

## 2021-05-25 LAB — RESP PANEL BY RT-PCR (FLU A&B, COVID) ARPGX2
Influenza A by PCR: NEGATIVE
Influenza B by PCR: NEGATIVE
SARS Coronavirus 2 by RT PCR: POSITIVE — AB

## 2021-05-25 LAB — GLUCOSE, CAPILLARY
Glucose-Capillary: 99 mg/dL (ref 70–99)
Glucose-Capillary: 131 mg/dL — ABNORMAL HIGH (ref 70–99)

## 2021-05-25 MED ORDER — MOLNUPIRAVIR EUA 200MG CAPSULE
4.0000 | ORAL_CAPSULE | Freq: Two times a day (BID) | ORAL | Status: DC
  Administered 2021-05-25 – 2021-05-27 (×2): 800 mg via ORAL
  Filled 2021-05-25 (×4): qty 4

## 2021-05-25 MED ORDER — TRAZODONE HCL 50 MG PO TABS
50.0000 mg | ORAL_TABLET | Freq: Every day | ORAL | Status: DC
  Administered 2021-05-25: 50 mg via ORAL
  Filled 2021-05-25: qty 1

## 2021-05-25 MED ORDER — AMLODIPINE BESYLATE 10 MG PO TABS
10.0000 mg | ORAL_TABLET | Freq: Every day | ORAL | Status: DC
  Administered 2021-05-25 – 2021-05-27 (×3): 10 mg via ORAL
  Filled 2021-05-25: qty 2
  Filled 2021-05-25: qty 1
  Filled 2021-05-25: qty 2

## 2021-05-25 MED ORDER — CALCIUM ACETATE (PHOS BINDER) 667 MG PO CAPS
1334.0000 mg | ORAL_CAPSULE | Freq: Three times a day (TID) | ORAL | Status: DC
  Administered 2021-05-26 (×2): 1334 mg via ORAL
  Filled 2021-05-25 (×3): qty 2

## 2021-05-25 MED ORDER — ACETAMINOPHEN 325 MG PO TABS
650.0000 mg | ORAL_TABLET | Freq: Four times a day (QID) | ORAL | Status: DC | PRN
  Administered 2021-05-26 – 2021-05-27 (×2): 650 mg via ORAL
  Filled 2021-05-25 (×2): qty 2

## 2021-05-25 MED ORDER — ROSUVASTATIN CALCIUM 20 MG PO TABS
20.0000 mg | ORAL_TABLET | Freq: Every day | ORAL | Status: DC
Start: 2021-05-25 — End: 2021-05-27
  Administered 2021-05-25: 20 mg via ORAL
  Filled 2021-05-25 (×2): qty 1

## 2021-05-25 MED ORDER — ONDANSETRON HCL 4 MG/2ML IJ SOLN: 4.0000 mg | Freq: Four times a day (QID) | INTRAMUSCULAR | Status: DC | PRN

## 2021-05-25 MED ORDER — DORZOLAMIDE HCL-TIMOLOL MAL 2-0.5 % OP SOLN
1.0000 [drp] | Freq: Two times a day (BID) | OPHTHALMIC | Status: DC
  Administered 2021-05-25 – 2021-05-26 (×2): 1 [drp] via OPHTHALMIC
  Filled 2021-05-25: qty 10

## 2021-05-25 MED ORDER — INSULIN ASPART 100 UNIT/ML IJ SOLN: 0.0000 [IU] | Freq: Every day | INTRAMUSCULAR | Status: DC

## 2021-05-25 MED ORDER — HEPARIN SODIUM (PORCINE) 5000 UNIT/ML IJ SOLN
5000.0000 [IU] | Freq: Three times a day (TID) | INTRAMUSCULAR | Status: DC
  Administered 2021-05-25 – 2021-05-27 (×4): 5000 [IU] via SUBCUTANEOUS
  Filled 2021-05-25 (×4): qty 1

## 2021-05-25 MED ORDER — ACETAMINOPHEN 650 MG RE SUPP: 650.0000 mg | Freq: Four times a day (QID) | RECTAL | Status: DC | PRN

## 2021-05-25 MED ORDER — HYDRALAZINE HCL 25 MG PO TABS
25.0000 mg | ORAL_TABLET | Freq: Three times a day (TID) | ORAL | Status: DC
  Administered 2021-05-25 – 2021-05-27 (×4): 25 mg via ORAL
  Filled 2021-05-25 (×4): qty 1

## 2021-05-25 MED ORDER — INSULIN ASPART 100 UNIT/ML IJ SOLN
0.0000 [IU] | Freq: Three times a day (TID) | INTRAMUSCULAR | Status: DC
  Administered 2021-05-26 – 2021-05-27 (×2): 1 [IU] via SUBCUTANEOUS
  Administered 2021-05-27: 2 [IU] via SUBCUTANEOUS

## 2021-05-25 MED ORDER — CALCIUM ACETATE (PHOS BINDER) 667 MG PO CAPS: 667.0000 mg | ORAL_CAPSULE | ORAL | Status: DC | PRN

## 2021-05-25 MED ORDER — CARVEDILOL 12.5 MG PO TABS
12.5000 mg | ORAL_TABLET | Freq: Two times a day (BID) | ORAL | Status: DC
  Administered 2021-05-25 – 2021-05-27 (×4): 12.5 mg via ORAL
  Filled 2021-05-25 (×4): qty 1

## 2021-05-25 MED ORDER — ALBUTEROL SULFATE (2.5 MG/3ML) 0.083% IN NEBU: 2.5000 mg | INHALATION_SOLUTION | RESPIRATORY_TRACT | Status: DC | PRN

## 2021-05-25 MED ORDER — FOLIC ACID 1 MG PO TABS
1.0000 mg | ORAL_TABLET | Freq: Every day | ORAL | Status: DC
  Filled 2021-05-25: qty 1

## 2021-05-25 MED ORDER — TRAZODONE HCL 50 MG PO TABS: 50.0000 mg | ORAL_TABLET | Freq: Every day | ORAL | Status: DC

## 2021-05-25 MED ORDER — CLONIDINE HCL 0.1 MG PO TABS
0.1000 mg | ORAL_TABLET | Freq: Two times a day (BID) | ORAL | Status: DC
  Administered 2021-05-25 – 2021-05-27 (×3): 0.1 mg via ORAL
  Filled 2021-05-25 (×3): qty 1

## 2021-05-25 MED ORDER — CLOPIDOGREL BISULFATE 75 MG PO TABS
75.0000 mg | ORAL_TABLET | Freq: Every day | ORAL | Status: DC
Start: 2021-05-25 — End: 2021-05-27
  Administered 2021-05-25: 75 mg via ORAL
  Filled 2021-05-25 (×3): qty 1

## 2021-05-25 MED ORDER — FUROSEMIDE 40 MG PO TABS
80.0000 mg | ORAL_TABLET | Freq: Two times a day (BID) | ORAL | Status: DC
  Administered 2021-05-26 – 2021-05-27 (×3): 80 mg via ORAL
  Filled 2021-05-25 (×3): qty 2

## 2021-05-25 MED ORDER — LATANOPROST 0.005 % OP SOLN
1.0000 [drp] | Freq: Every day | OPHTHALMIC | Status: DC
  Administered 2021-05-25: 1 [drp] via OPHTHALMIC
  Filled 2021-05-25: qty 2.5

## 2021-05-25 MED ORDER — SODIUM ZIRCONIUM CYCLOSILICATE 5 G PO PACK
10.0000 g | PACK | Freq: Once | ORAL | Status: AC
  Administered 2021-05-25: 10 g via ORAL
  Filled 2021-05-25: qty 2

## 2021-05-25 MED ORDER — FENOFIBRATE 160 MG PO TABS
160.0000 mg | ORAL_TABLET | Freq: Every day | ORAL | Status: DC
  Filled 2021-05-25: qty 1

## 2021-05-25 MED ORDER — ONDANSETRON HCL 4 MG PO TABS: 4.0000 mg | ORAL_TABLET | Freq: Four times a day (QID) | ORAL | Status: DC | PRN

## 2021-05-25 MED ORDER — ASPIRIN EC 81 MG PO TBEC
81.0000 mg | DELAYED_RELEASE_TABLET | Freq: Every day | ORAL | Status: DC
Start: 2021-05-25 — End: 2021-05-27
  Administered 2021-05-25: 81 mg via ORAL
  Filled 2021-05-25 (×2): qty 1

## 2021-05-25 MED ORDER — BRIMONIDINE TARTRATE 0.2 % OP SOLN
1.0000 [drp] | Freq: Three times a day (TID) | OPHTHALMIC | Status: DC
  Administered 2021-05-25 – 2021-05-26 (×2): 1 [drp] via OPHTHALMIC
  Filled 2021-05-25: qty 5

## 2021-05-25 MED ORDER — GUAIFENESIN ER 600 MG PO TB12
600.0000 mg | ORAL_TABLET | Freq: Two times a day (BID) | ORAL | Status: DC
  Administered 2021-05-25 – 2021-05-27 (×3): 600 mg via ORAL
  Filled 2021-05-25 (×3): qty 1

## 2021-05-25 NOTE — Progress Notes (Signed)
Patient arrived to 4E from [facility]. Vitals taken and stable. No tele orders at this time. Patient oriented to unit. Awaiting dialysis catheter exchange tomorrow 2/21. Informed patient of NPO at midnight. All questions answered. Call bell within reach. [name]

## 2021-05-25 NOTE — Assessment & Plan Note (Signed)
Catheter was not functioning during HD session 2/20 Plans were discussed by EDP with Vascular surgery and IR Patient will have dialysis catheter exchange on 2/22 by IR at [facility]

## 2021-05-25 NOTE — Assessment & Plan Note (Signed)
Continue statin. 

## 2021-05-25 NOTE — Procedures (Addendum)
° °  Nephrology Nursing Note:  Phone call received from [place]. [place].  Pt presents with non-functioning TDC.  Per [facility] assistant administrator, Cathflo was instilled yesterday without restoration of patency.    Recommend CXR to verify position / absence of kink.  If CXR is unremarkable we could retry Cathflo, possibly an overnight dwell, but if this is unsuccessful he may require TPA infusion for fibrin sheath disruption or cath exchange.  I am unable to troubleshoot the catheter until after 1600 today. ___________________________  Addendum: Above discussed with Dr. Moshe Cipro who feels another round of Cathflo would likely not benefit this catheter.  She recommends intervention versus exchange by Dr. Donnetta Hutching or [facility].     Rockwell Alexandria, RN

## 2021-05-25 NOTE — Assessment & Plan Note (Signed)
Currently appears to be euvolemic He is on lasix, still makes some urine Volume management with HD

## 2021-05-25 NOTE — ED Provider Notes (Signed)
[facility] EMERGENCY DEPARTMENT Provider Note   CSN: [id number] Arrival date & time: 05/25/21  1017     History  Chief Complaint  Patient presents with   Vascular Access Problem    [name] Abalos. is a 66 y.o. male.  HPI      [name]. is a 66 y.o. male who presents to the Emergency Department complaining of dysfunction of dialysis catheter.  He states that he went to his dialysis center yesterday for his routine dialysis.  Catheter was heparinized, but clogged and he was unable to receive treatment.  Patient was also notified yesterday of positive COVID results.  He denies any COVID-related symptoms at this time.  He does endorse occasional cough but denies any shortness of breath or chest pain.  He does not have a supplemental oxygen requirement.  Denies fever, vomiting or diarrhea.  Here today requesting catheter exchange.    Home Medications Prior to Admission medications   Medication Sig Start Date End Date Taking? Authorizing Provider  acetaminophen (TYLENOL) 500 MG tablet Take 1,000 mg by mouth every 8 (eight) hours as needed for mild pain or headache.     [provider]  amLODipine (NORVASC) 10 MG tablet Take 10 mg by mouth daily. for high blood pressure 05/09/17   [provider]  aspirin 81 MG tablet Take 81 mg by mouth daily.      [provider]  brimonidine (ALPHAGAN) 0.2 % ophthalmic solution Place 1 drop into the right eye 3 (three) times daily.    [provider]  calcium acetate (PHOSLO) 667 MG capsule Take 667-1,334 mg by mouth See admin instructions. 2 caps three times daily with meals, and 1 capsule with snacks 03/25/15   [provider]  carvedilol (COREG) 12.5 MG tablet take 1 tablet by mouth twice daily.  DO NOT TAKE IN THE MORNING ON DIALYSIS DAYS 09/29/20   Evans Lance, MD  cloNIDine (CATAPRES) 0.1 MG tablet Take 1 tablet (0.1 mg total) by mouth at bedtime. Patient taking differently: Take 0.1 mg by  mouth 2 (two) times daily. 06/28/16   Domenic Polite, MD  clopidogrel (PLAVIX) 75 MG tablet Take 75 mg by mouth daily.      [provider]  dorzolamide-timolol (COSOPT) 22.3-6.8 MG/ML ophthalmic solution Place 1 drop into the right eye 2 (two) times daily.    [provider]  fenofibrate 160 MG tablet Take 160 mg by mouth daily. 03/09/15   [provider]  fluticasone (FLONASE) 50 MCG/ACT nasal spray Place 1 spray into both nostrils daily as needed for allergies.    [provider]  folic acid (FOLVITE) 1 MG tablet Take 1 mg by mouth daily.    [provider]  furosemide (LASIX) 20 MG tablet Take 60-80 mg by mouth 2 (two) times daily. Take 3 tabs twice daily on NON DIALYSIS DAYS    [provider]  hydrALAZINE (APRESOLINE) 25 MG tablet Take 1 tablet (25 mg total) by mouth 3 (three) times daily. Please make overdue appt with Dr. Harrington Challenger before anymore refills. 1st attempt 04/25/18   Fay Records, MD  insulin lispro (HUMALOG) 100 UNIT/ML KiwkPen Inject 15-20 Units into the skin 3 (three) times daily. Give per sliding scale    [provider]  LANTUS SOLOSTAR 100 UNIT/ML Solostar Pen Inject 50 Units into the skin at bedtime. 02/05/21   [provider]  latanoprost (XALATAN) 0.005 % ophthalmic solution Place 1 drop into the  right eye at bedtime.    [provider]  oxyCODONE-acetaminophen (PERCOCET) 5-325 MG tablet Take 1 tablet by mouth every 6 (six) hours as needed for severe pain. 02/20/21   Ulyses Amor, PA-C  rosuvastatin (CRESTOR) 20 MG tablet Take 20 mg by mouth daily. 02/24/15   [provider]  traZODone (DESYREL) 50 MG tablet Take 50-100 mg by mouth at bedtime. 12/25/20   [provider]      Allergies    Acetazolamide, Contrast media [iodinated contrast media], and Other    Review of Systems   Review of Systems  Constitutional:  Negative for appetite change and fever.  Respiratory:  Positive  for cough. Negative for shortness of breath.   Cardiovascular:  Negative for chest pain.  Gastrointestinal:  Negative for abdominal pain, diarrhea, nausea and vomiting.  Neurological:  Negative for dizziness, weakness, numbness and headaches.   Physical Exam Updated Vital Signs BP (!) 144/71    Pulse 78    Temp 97.7 F (36.5 C) (Oral)    Resp 17    Wt 113.4 kg    SpO2 96%    BMI 33.91 kg/m  Physical Exam Vitals and nursing note reviewed.  Constitutional:      Appearance: Normal appearance. He is not ill-appearing.  Cardiovascular:     Rate and Rhythm: Normal rate and regular rhythm.     Pulses: Normal pulses.  Pulmonary:     Effort: Pulmonary effort is normal. No respiratory distress.     Breath sounds: Normal breath sounds. No stridor.  Abdominal:     General: There is no distension.     Palpations: Abdomen is soft.     Tenderness: There is no abdominal tenderness.  Musculoskeletal:     Comments: Bilateral BKA's no erythema   Skin:    Capillary Refill: Capillary refill takes less than 2 seconds.  Neurological:     General: No focal deficit present.     Mental Status: He is alert.     Sensory: No sensory deficit.     Motor: No weakness.    ED Results / Procedures / Treatments   Labs (all labs ordered are listed, but only abnormal results are displayed) Labs Reviewed  CBC WITH DIFFERENTIAL/PLATELET - Abnormal; Notable for the following components:      Result Value   RBC 3.23 (*)    Hemoglobin 10.1 (*)    HCT 32.2 (*)    All other components within normal limits  BASIC METABOLIC PANEL - Abnormal; Notable for the following components:   Glucose, Bld 175 (*)    BUN 76 (*)    Creatinine, Ser 10.90 (*)    Calcium 8.6 (*)    GFR, Estimated 5 (*)    All other components within normal limits  RESP PANEL BY RT-PCR (FLU A&B, COVID) [id number]    EKG None  Radiology DG Chest Portable 1 View  Result Date: 05/25/2021 CLINICAL DATA:  Dialysis catheter complication. EXAM:  PORTABLE CHEST 1 VIEW COMPARISON:  Chest radiograph 06/21/2016 FINDINGS: A right jugular catheter terminates over the high right atrium. A single lead ICD remains in place. The cardiac silhouette remains mildly enlarged. Lung volumes are low with minimal atelectasis in the lung bases. A trace right pleural effusion is not excluded. No pulmonary edema or pneumothorax is identified. No acute osseous abnormality is seen. IMPRESSION: Low lung volumes with minimal bibasilar atelectasis and possible trace right pleural effusion. Electronically Signed   By: Seymour Bars.D.  On: 05/25/2021 12:33    Procedures Procedures    Medications Ordered in ED Medications - No data to display  ED Course/ Medical Decision Making/ A&P                            12:00 spoke with Levada Dy, dialysis nurse here.  She will come evaluate patient's dialysis port.  Dialysis nurse, Levada Dy spoke with nephrology Dr. Moshe Cipro.  Lokelma was ordered by nephrology, did not feel additional Cathflo dosing would be helpful at this point..  It was determined that patient will likely need to see vascular for replacement as this seems to be a recurring problem for him.  Medical Decision Making Patient with dialysis catheter in place.  Routine dialysis Monday Wednesday Friday.  He was seen at the dialysis center yesterday and dialysis was attempted but catheter is clogged.  Patient notes that he is COVID-positive, denies any COVID-related symptoms at this time.  Here for possible exchange of his catheter   Amount and/or Complexity of Data Reviewed External Data Reviewed: notes.    Details: I have reviewed medical record.  Patient seen by vascular, Dr. Donnetta Hutching on 05/19/2021.  Has plan for left arm AV graft on 06/01/2021 Labs: ordered.    Details: Labs show no leukocytosis, hemoglobin 10.1 this is improved from July.  Chemistry show blood sugar of 175.  BUN elevated at 76 serum creatinine 10.9.  GFR is less than 5.  COVID and influenza  test pending. Radiology: ordered.    Details: Chest x-ray shows bibasilar atelectasis with trace right pleural effusion.  I agree with radiology interpretation. Discussion of management or test interpretation with external provider(s): Consulted vascular surgery, Dr. Donzetta Matters and discussed findings.  Felt that patient would likely need to come to [facility] for IR catheter exchange.  Consult IR, Dr. Annamaria Boots who agrees to catheter exchange for tomorrow.  Asked that IR nurse to be consulted for scheduling.  Patient will be admitted to hospitalist service and transfer to [facility] with IR nurse, Loree Fee who acknowledged plan.  Discussed findings with [facility] hospitalist, Dr. Roderic Palau who agrees to admit and arrange for patient's transfer to [facility]           Final Clinical Impression(s) / ED Diagnoses Final diagnoses:  Complication associated with dialysis catheter  COVID    Rx / DC Orders ED Discharge Orders     None         Kem Parkinson, PA-C 05/25/21 [place], Alvin Critchley, DO 05/27/21 [id number]

## 2021-05-25 NOTE — Assessment & Plan Note (Signed)
Continue home regimen  Carvedilol, amlodipine, clonidine, hydralazine

## 2021-05-25 NOTE — H&P (Signed)
History and Physical    Patient: [name]. WUJ:[id number] DOB: 10/24/1955 DOA: 05/25/2021 DOS: the patient was seen and examined on 05/25/2021 PCP: [name], MD  Patient coming from: Home  Chief Complaint:  Chief Complaint  Patient presents with   Vascular Access Problem    HPI: [name]. is a 66 y.o. male with medical history significant of end-stage renal disease on hemodialysis, peripheral vascular disease status post bilateral BKA, diabetes, hypertension, chronic combined congestive heart failure, had going to his dialysis center on 2/20 for his regularly scheduled dialysis.  It was noted that his catheter was not functioning despite injecting with Cathflo.  He came to the hospital for evaluation.  In ED, his case was reviewed with vascular surgery and interventional radiology.  Recommendations were to have catheter exchanged by interventional radiology at [facility].  Patient also reports that he was recently diagnosed with COVID on 2/20.  He had been experiencing a cough starting 2/18.  Cough is now productive.  He has not been febrile or short of breath.  No vomiting or diarrhea.  Currently, he does feel as though he is starting to retain fluid.  Review of Systems: As mentioned in the history of present illness. All other systems reviewed and are negative. Past Medical History:  Diagnosis Date   AICD (automatic cardioverter/defibrillator) present    Anemia    Arthritis    Cerebrovascular disease    CHF (congestive heart failure) (HCC)    Chronic kidney disease    ESRD, MWF HD   Diabetes mellitus    type II   History of TIAs    Hyperlipidemia    Hypertension    Nonischemic cardiomyopathy (HCC)    PVD (peripheral vascular disease) (HCC)    s/p B BKA   Shortness of breath dyspnea    with exertion    Skin disease    Rare   Stroke ([place])    "light stroke"   Tobacco abuse    Past Surgical History:  Procedure Laterality Date   A/V FISTULAGRAM Left 06/24/2016    Procedure: A/V Fistulagram;  Surgeon: Elam Dutch, MD;  Location: [facility];  Service: Cardiovascular;  Laterality: Left;   A/V FISTULAGRAM N/A 01/17/2017   Procedure: A/V Fistulagram - left arm;  Surgeon: Serafina Mitchell, MD;  Location: [facility];  Service: Cardiovascular;  Laterality: N/A;   AV FISTULA PLACEMENT Left 04/08/2015   Procedure: Creation of Left arm BRACHIOCEPHALIC ARTERIOVENOUS  FISTULA ;  Surgeon: Mal Misty, MD;  Location: [facility];  Service: Vascular;  Laterality: Left;   AV FISTULA PLACEMENT Left 10/27/2020   Procedure: LEFT ARM ARTERIOVENOUS (AV) FISTULA CREATION;  Surgeon: Rosetta Posner, MD;  Location: [facility];  Service: Vascular;  Laterality: Left;   [place] Left 01/12/2021   Procedure: LEFT ARM SECOND STAGE BASILIC VEIN TRANSPOSITION;  Surgeon: Rosetta Posner, MD;  Location: [facility];  Service: Vascular;  Laterality: Left;   CARDIAC DEFIBRILLATOR [place] EXTRACTION W/PHACO Right 03/17/2015   Procedure: CATARACT EXTRACTION PHACO AND INTRAOCULAR LENS PLACEMENT ([place]);  Surgeon: Rutherford Guys, MD;  Location: [facility];  Service: Ophthalmology;  Laterality: Right;  CDE: 6.59   EYE SURGERY Right    Cataract   LEG AMPUTATION BELOW KNEE     bilateral   PERIPHERAL VASCULAR INTERVENTION  01/17/2017   Procedure: PERIPHERAL VASCULAR INTERVENTION;  Surgeon: Harold Barban  W, MD;  Location: [facility];  Service: Cardiovascular;;  PTA  left arm fistula   WOUND DEBRIDEMENT Left 02/18/2021   Procedure: LEFT ARM DEBRIDEMENT;  Surgeon: Serafina Mitchell, MD;  [facility];  Service: Vascular;  Laterality: Left;   Social History:  reports that he quit smoking about 23 years ago. His smoking use included cigarettes. He has a 20.00 pack-year smoking history. He has never used smokeless tobacco. He reports that he does not currently use alcohol. He reports that he does not use drugs.  Allergies   Allergen Reactions   Acetazolamide Other (See Comments)    Jittery odd feeling. (hyper feeling)   Contrast Media [Iodinated Contrast Media] Hives    In the 80's    Other Other (See Comments)    Transfer Dye---"Makes me tired"    Family History  Problem Relation Age of Onset   Diabetes Father    Stroke Father    Diabetes Brother    Diabetes Brother    Diabetes Brother    Diabetes Brother    Heart failure Mother    Diabetes Mother    Diabetes Other    Coronary artery disease Other     Prior to Admission medications   Medication Sig Start Date End Date Taking? Authorizing Provider  acetaminophen (TYLENOL) 500 MG tablet Take 1,000 mg by mouth every 8 (eight) hours as needed for mild pain or headache.     [provider]  amLODipine (NORVASC) 10 MG tablet Take 10 mg by mouth daily. for high blood pressure 05/09/17   [provider]  aspirin 81 MG tablet Take 81 mg by mouth daily.      [provider]  brimonidine (ALPHAGAN) 0.2 % ophthalmic solution Place 1 drop into the right eye 3 (three) times daily.    [provider]  calcium acetate (PHOSLO) 667 MG capsule Take 667-1,334 mg by mouth See admin instructions. 2 caps three times daily with meals, and 1 capsule with snacks 03/25/15   [provider]  carvedilol (COREG) 12.5 MG tablet take 1 tablet by mouth twice daily.  DO NOT TAKE IN THE MORNING ON DIALYSIS DAYS 09/29/20   Evans Lance, MD  cloNIDine (CATAPRES) 0.1 MG tablet Take 1 tablet (0.1 mg total) by mouth at bedtime. Patient taking differently: Take 0.1 mg by mouth 2 (two) times daily. 06/28/16   Domenic Polite, MD  clopidogrel (PLAVIX) 75 MG tablet Take 75 mg by mouth daily.      [provider]  dorzolamide-timolol (COSOPT) 22.3-6.8 MG/ML ophthalmic solution Place 1 drop into the right eye 2 (two) times daily.    [provider]  fenofibrate 160 MG tablet Take 160 mg by mouth daily. 03/09/15   [provider]  fluticasone (FLONASE) 50 MCG/ACT nasal spray Place 1 spray into both nostrils daily as needed for allergies.    [provider]  folic acid (FOLVITE) 1 MG tablet Take 1 mg by mouth daily.    [provider]  furosemide (LASIX) 20 MG tablet Take 60-80 mg by mouth 2 (two) times daily. Take 3 tabs twice daily on NON DIALYSIS DAYS    [provider]  hydrALAZINE (APRESOLINE) 25 MG tablet Take 1 tablet (25 mg total) by mouth 3 (three) times daily. Please make overdue appt with Dr. Harrington Challenger before anymore refills. 1st attempt 04/25/18   Fay Records, MD  insulin lispro (HUMALOG) 100 UNIT/ML KiwkPen Inject 15-20 Units into the skin  3 (three) times daily. Give per sliding scale    [provider]  LANTUS SOLOSTAR 100 UNIT/ML Solostar Pen Inject 50 Units into the skin at bedtime. 02/05/21   [provider]  latanoprost (XALATAN) 0.005 % ophthalmic solution Place 1 drop into the right eye at bedtime.    [provider]  oxyCODONE-acetaminophen (PERCOCET) 5-325 MG tablet Take 1 tablet by mouth every 6 (six) hours as needed for severe pain. 02/20/21   Ulyses Amor, PA-C  rosuvastatin (CRESTOR) 20 MG tablet Take 20 mg by mouth daily. 02/24/15   [provider]  traZODone (DESYREL) 50 MG tablet Take 50-100 mg by mouth at bedtime. 12/25/20   [provider]    Physical Exam: Vitals:   05/25/21 1330 05/25/21 1345 05/25/21 1649 05/25/21 1743  BP:    (!) 164/75  Pulse: 85 92 87 77  Resp: 16 (!) 24 16 19   Temp:   98.7 F (37.1 C) 98.8 F (37.1 C)  TempSrc:   Oral Oral  SpO2: 99% 94% 96% 96%  Weight:    114 kg  Height:    6' (1.829 m)   General exam: Alert, awake, oriented x 3 Respiratory system: Clear to auscultation. Respiratory effort normal. Cardiovascular system:RRR. No murmurs, rubs, gallops. Gastrointestinal system: Abdomen is nondistended, soft and nontender. No organomegaly or masses felt. Normal bowel sounds  heard. Central nervous system: Alert and oriented. No focal neurological deficits. Extremities: Bilateral BKA Skin: No rashes, lesions or ulcers Psychiatry: Judgement and insight appear normal. Mood & affect appropriate.    Data Reviewed:  Basic labs including CBC, chemistry, chest x-ray reviewed  Assessment and Plan: * Complication associated with dialysis catheter- (present on admission) Catheter was not functioning during HD session 2/20 Plans were discussed by EDP with Vascular surgery and IR Patient will have dialysis catheter exchange on 2/22 by IR at [facility]   COVID-19 virus infection- (present on admission) Reports having cough No signs of pneumonia on imaging No hypoxia Treat supportively Start on molnupiravir Reports having 2 initial vaccines and one booster.  ESRD (end stage renal disease) ([place])- (present on admission) Last HD treatment was on Friday 2/17 Catheter did not function on 2/20 He will need next dialysis session on 2/22 after catheter exchange Nephrology aware  Chronic combined systolic (congestive) and diastolic (congestive) heart failure ([place])- (present on admission) Currently appears to be euvolemic He is on lasix, still makes some urine Volume management with HD  DM (diabetes mellitus), type 2 with peripheral vascular complications ([place])- (present on admission) Waiting on reconciliation of home insulin regimen Continue on sliding scale for now  Peripheral vascular disease ([place])- (present on admission) S/p B/L BKA  Essential hypertension- (present on admission) Continue home regimen  Carvedilol, amlodipine, clonidine, hydralazine  Hyperlipidemia- (present on admission) Continue statin       Advance Care Planning:   Code Status: Full Code   Consults: Interventional radiology, nephrology  Family Communication: Discussed with patient  Severity of Illness: The appropriate patient status for this patient is OBSERVATION. Observation status  is judged to be reasonable and necessary in order to provide the required intensity of service to ensure the patient's safety. The patient's presenting symptoms, physical exam findings, and initial radiographic and laboratory data in the context of their medical condition is felt to place them at decreased risk for further clinical deterioration. Furthermore, it is anticipated that the patient will be medically stable for discharge from the hospital within 2 midnights of admission.  Author: Kathie Dike, MD 05/25/2021 6:59 PM  For on call review [email or web address].

## 2021-05-25 NOTE — Assessment & Plan Note (Signed)
Reports having cough No signs of pneumonia on imaging No hypoxia Treat supportively Start on molnupiravir Reports having 2 initial vaccines and one booster.

## 2021-05-25 NOTE — Assessment & Plan Note (Signed)
S/p B/L BKA

## 2021-05-25 NOTE — Assessment & Plan Note (Addendum)
Continue home regimen but at a lower dose as well controlled in the hospital

## 2021-05-25 NOTE — Assessment & Plan Note (Addendum)
Last HD treatment was on Friday 2/17 Catheter did not function on 2/20 -HD 2/22 OPM-- due again on friday Nephrology aware

## 2021-05-25 NOTE — ED Triage Notes (Signed)
Patient sent from dialysis due to port  being clogged> Last dialysis treatment was   on Friday . Patient reports that he tested positive for COVID.

## 2021-05-26 ENCOUNTER — Observation Stay (HOSPITAL_COMMUNITY): Payer: Medicare Other

## 2021-05-26 DIAGNOSIS — T8249XA Other complication of vascular dialysis catheter, initial encounter: Secondary | ICD-10-CM | POA: Diagnosis not present

## 2021-05-26 DIAGNOSIS — T829XXA Unspecified complication of cardiac and vascular prosthetic device, implant and graft, initial encounter: Secondary | ICD-10-CM | POA: Diagnosis not present

## 2021-05-26 HISTORY — PX: IR PTA VENOUS EXCEPT DIALYSIS CIRCUIT: IMG6126

## 2021-05-26 HISTORY — PX: IR FLUORO GUIDE CV LINE RIGHT: IMG2283

## 2021-05-26 LAB — GLUCOSE, CAPILLARY
Glucose-Capillary: 64 mg/dL — ABNORMAL LOW (ref 70–99)
Glucose-Capillary: 87 mg/dL (ref 70–99)
Glucose-Capillary: 144 mg/dL — ABNORMAL HIGH (ref 70–99)
Glucose-Capillary: 160 mg/dL — ABNORMAL HIGH (ref 70–99)

## 2021-05-26 LAB — RENAL FUNCTION PANEL
Albumin: 3 g/dL — ABNORMAL LOW (ref 3.5–5.0)
Anion gap: 18 — ABNORMAL HIGH (ref 5–15)
BUN: 76 mg/dL — ABNORMAL HIGH (ref 8–23)
CO2: 23 mmol/L (ref 22–32)
Calcium: 8.7 mg/dL — ABNORMAL LOW (ref 8.9–10.3)
Chloride: 101 mmol/L (ref 98–111)
Creatinine, Ser: 10.95 mg/dL — ABNORMAL HIGH (ref 0.61–1.24)
GFR, Estimated: 5 mL/min — ABNORMAL LOW (ref >60)
Glucose, Bld: 98 mg/dL (ref 70–99)
Phosphorus: 7 mg/dL — ABNORMAL HIGH (ref 2.5–4.6)
Potassium: 5.1 mmol/L (ref 3.5–5.1)
Sodium: 142 mmol/L (ref 135–145)

## 2021-05-26 LAB — CBC
HCT: 31.7 % — ABNORMAL LOW (ref 39.0–52.0)
Hemoglobin: 10.2 g/dL — ABNORMAL LOW (ref 13.0–17.0)
MCH: 31.1 pg (ref 26.0–34.0)
MCHC: 32.2 g/dL (ref 30.0–36.0)
MCV: 96.6 fL (ref 80.0–100.0)
Platelets: 139 10*3/uL — ABNORMAL LOW (ref 150–400)
RBC: 3.28 MIL/uL — ABNORMAL LOW (ref 4.22–5.81)
RDW: 14.6 % (ref 11.5–15.5)
WBC: 5.1 10*3/uL (ref 4.0–10.5)
nRBC: 0 % (ref 0.0–0.2)

## 2021-05-26 MED ORDER — LIDOCAINE-PRILOCAINE 2.5-2.5 % EX CREA: 1.0000 "application " | TOPICAL_CREAM | CUTANEOUS | Status: DC | PRN

## 2021-05-26 MED ORDER — CHLORHEXIDINE GLUCONATE CLOTH 2 % EX PADS
6.0000 | MEDICATED_PAD | Freq: Every day | CUTANEOUS | Status: DC
  Administered 2021-05-26 – 2021-05-27 (×2): 6 via TOPICAL

## 2021-05-26 MED ORDER — HEPARIN SODIUM (PORCINE) 1000 UNIT/ML DIALYSIS: 1000.0000 [IU] | INTRAMUSCULAR | Status: DC | PRN

## 2021-05-26 MED ORDER — PENTAFLUOROPROP-TETRAFLUOROETH EX AERO: 1.0000 "application " | INHALATION_SPRAY | CUTANEOUS | Status: DC | PRN

## 2021-05-26 MED ORDER — LIDOCAINE-EPINEPHRINE 1 %-1:100000 IJ SOLN
INTRAMUSCULAR | Status: DC | PRN
  Administered 2021-05-26: 10 mL

## 2021-05-26 MED ORDER — LIDOCAINE-EPINEPHRINE 1 %-1:100000 IJ SOLN
INTRAMUSCULAR | Status: AC
  Filled 2021-05-26: qty 1

## 2021-05-26 MED ORDER — ALTEPLASE 2 MG IJ SOLR: 2.0000 mg | Freq: Once | INTRAMUSCULAR | Status: DC | PRN

## 2021-05-26 MED ORDER — LIDOCAINE HCL (PF) 1 % IJ SOLN: 5.0000 mL | INTRAMUSCULAR | Status: DC | PRN

## 2021-05-26 MED ORDER — HEPARIN SODIUM (PORCINE) 1000 UNIT/ML IJ SOLN
INTRAMUSCULAR | Status: AC
  Filled 2021-05-26: qty 10

## 2021-05-26 MED ORDER — SODIUM CHLORIDE 0.9 % IV SOLN: 100.0000 mL | INTRAVENOUS | Status: DC | PRN

## 2021-05-26 NOTE — Progress Notes (Addendum)
Nephrology Quick Note:  Aware that [name] was transferred from [facility] to [facility] overnight and admitted OBS status with plan for [facility] exchange today. Looks like that has now happened. K 5.1 and on room air, but overdue for dialysis. Found to be COVID + with cough, started on molnupiravir.  Plan: - Will dialyze today but will have to be done in isolation d/t COVID status, so will likely be later today. Orders have been placed. If deemed stable after his treatment, he can likely be discharged.   Veneta Penton, PA-C [facility] Pager [phone]   ADDENDUM 1:42PM: Spoke to his unit to let them know about his COVID test -> they already knew which is why he had to have his cath exchanged at the hospital rather than the outpatient vascular access center. They said to remind him that his next outpatient HD will be on Friday at [facility] at the [facility] unit - they report all is arranged and he should know this.  KS

## 2021-05-26 NOTE — Plan of Care (Signed)

## 2021-05-26 NOTE — Procedures (Signed)
Interventional Radiology Procedure Note  Procedure: Tunneled HD Catheter Catheter exchange with Fibrin Sheath Maceration  Indication: Renal Failure. Non-functioning right IJ tunneled HD Catheter  Findings:  Catheter tip is at the cavo-atrial junction. Catheter is ready for sure. Please refer to procedural dictation for full description.  Complications: None  EBL: < 10 mL  Miachel Roux, MD [phone]

## 2021-05-26 NOTE — Progress Notes (Signed)
Progress Note    [name].  HWE:[id number] DOB: 1956-02-01  DOA: 05/25/2021 PCP: [name], MD    Brief Narrative:     Medical records reviewed and are as summarized below:  [name]. is an 66 y.o. male with medical history significant of end-stage renal disease on hemodialysis, peripheral vascular disease status post bilateral BKA, diabetes, hypertension, chronic combined congestive heart failure, had going to his dialysis center on 2/20 for his regularly scheduled dialysis.  It was noted that his catheter was not functioning despite injecting with Cathflo.  He came to the hospital for evaluation.  In ED, his case was reviewed with vascular surgery and interventional radiology.  Recommendations were to have catheter exchanged by interventional radiology at [facility].  Patient also reports that he was recently diagnosed with COVID on 2/20.  He had been experiencing a cough starting 2/18.  Assessment/Plan:   Principal Problem:   Complication associated with dialysis catheter Active Problems:   Hyperlipidemia   Essential hypertension   Peripheral vascular disease (HCC)   DM (diabetes mellitus), type 2 with peripheral vascular complications (HCC)   Chronic combined systolic (congestive) and diastolic (congestive) heart failure (HCC)   ESRD (end stage renal disease) (HCC)   S/P bilateral BKA (below knee amputation) ([place])   COVID-19 virus infection   Complication associated with dialysis catheter- (present on admission) Catheter was not functioning during HD session 2/20 Plans were discussed by EDP with Vascular surgery and IR S/p dialysis catheter exchange on 2/22 by IR at [facility]     COVID-19 virus infection- (present on admission) Reports having cough No signs of pneumonia on imaging No hypoxia Treat supportively Started on molnupiravir Reports having 2 initial vaccines and one booster.   ESRD (end stage renal disease) ([place])- (present on admission) Last HD  treatment was on Friday 2/17 Catheter did not function on 2/20 Getting HD evening of 2/22- will be after 6pm Nephrology aware   Chronic combined systolic (congestive) and diastolic (congestive) heart failure ([place])- (present on admission) Currently appears to be euvolemic He is on lasix, still makes some urine Volume management with HD   DM (diabetes mellitus), type 2 with peripheral vascular complications ([place])- (present on admission) Continue on sliding scale for now   Peripheral vascular disease ([place])- (present on admission) S/p B/L BKA   Essential hypertension- (present on admission) Continue home regimen  Carvedilol, amlodipine, clonidine, hydralazine   Hyperlipidemia- (present on admission) Continue statin  obesity Body mass index is 34.09 kg/m.   Family Communication/Anticipated D/C date and plan/Code Status   Home in AM           Medical Consultants:   IR renal  Subjective:   + cough  Objective:    Vitals:   05/25/21 2357 05/26/21 0357 05/26/21 0903 05/26/21 1106  BP: (!) 150/60 132/71 (!) 163/84 (!) 181/84  Pulse: 66 64 73 62  Resp: 18 18 19 16   Temp: 98.6 F (37 C) 98.1 F (36.7 C) 98.6 F (37 C) 98.6 F (37 C)  TempSrc: Oral Oral Oral Oral  SpO2: 93% 98% 99% 99%  Weight:      Height:        Intake/Output Summary (Last 24 hours) at 05/26/2021 1402 Last data filed at 05/25/2021 1800 Gross per 24 hour  Intake 240 ml  Output --  Net 240 ml   Filed Weights   05/25/21 1042 05/25/21 1743  Weight: 113.4 kg 114 kg  Exam: Sitting in bed  Data Reviewed:   I have personally reviewed following labs and imaging studies:  Labs: Labs show the following:   Basic Metabolic Panel: Recent Labs  Lab 05/25/21 1224 05/26/21 0155  NA 140 142  K 5.1 5.1  CL 104 101  CO2 25 23  GLUCOSE 175* 98  BUN 76* 76*  CREATININE 10.90* 10.95*  CALCIUM 8.6* 8.7*  PHOS  --  7.0*   GFR Estimated Creatinine Clearance: 8.4 mL/min (A) (by C-G  formula based on SCr of 10.95 mg/dL (H)). Liver Function Tests: Recent Labs  Lab 05/26/21 0155  ALBUMIN 3.0*   No results for input(s): LIPASE, AMYLASE in the last 168 hours. No results for input(s): AMMONIA in the last 168 hours. Coagulation profile No results for input(s): INR, PROTIME in the last 168 hours.  CBC: Recent Labs  Lab 05/25/21 1224 05/26/21 0155  WBC 4.9 5.1  NEUTROABS 3.4  --   HGB 10.1* 10.2*  HCT 32.2* 31.7*  MCV 99.7 96.6  PLT 153 139*   Cardiac Enzymes: No results for input(s): CKTOTAL, CKMB, CKMBINDEX, TROPONINI in the last 168 hours. BNP (last 3 results) No results for input(s): PROBNP in the last 8760 hours. CBG: Recent Labs  Lab 05/25/21 1752 05/25/21 2130 05/26/21 0635 05/26/21 1101  GLUCAP 99 131* 64* 87   D-Dimer: No results for input(s): DDIMER in the last 72 hours. Hgb A1c: No results for input(s): HGBA1C in the last 72 hours. Lipid Profile: No results for input(s): CHOL, HDL, LDLCALC, TRIG, CHOLHDL, LDLDIRECT in the last 72 hours. Thyroid function studies: No results for input(s): TSH, T4TOTAL, T3FREE, THYROIDAB in the last 72 hours.  Invalid input(s): FREET3 Anemia work up: No results for input(s): VITAMINB12, FOLATE, FERRITIN, TIBC, IRON, RETICCTPCT in the last 72 hours. Sepsis Labs: Recent Labs  Lab 05/25/21 1224 05/26/21 0155  WBC 4.9 5.1    Microbiology Recent Results (from the past 240 hour(s))  Resp Panel by RT-PCR (Flu A&B, Covid) Nasopharyngeal Swab     Status: Abnormal   Collection Time: 05/25/21  2:12 PM   Specimen: Nasopharyngeal Swab; Nasopharyngeal(NP) swabs in vial transport medium  Result Value Ref Range Status   SARS Coronavirus 2 by RT PCR POSITIVE (A) NEGATIVE Final    Comment: (NOTE) SARS-CoV-2 target nucleic acids are DETECTED.  The SARS-CoV-2 RNA is generally detectable in upper respiratory specimens during the acute phase of infection. Positive results are indicative of the presence of the  identified virus, but do not rule out bacterial infection or co-infection with other pathogens not detected by the test. Clinical correlation with patient history and other diagnostic information is necessary to determine patient infection status. The expected result is Negative.  Fact Sheet for Patients: EntrepreneurPulse.com.au  Fact Sheet for Healthcare Providers: IncredibleEmployment.be  This test is not yet approved or cleared by the [place] FDA and  has been authorized for detection and/or diagnosis of SARS-CoV-2 by FDA under an Emergency Use Authorization (EUA).  This EUA will remain in effect (meaning this test can be used) for the duration of  the COVID-19 declaration under Section 564(b)(1) of the A ct, 21 U.S.C. section 360bbb-3(b)(1), unless the authorization is terminated or revoked sooner.     Influenza A by PCR NEGATIVE NEGATIVE Final   Influenza B by PCR NEGATIVE NEGATIVE Final    Comment: (NOTE) The Xpert Xpress SARS-CoV-2/FLU/RSV plus assay is intended as an aid in the diagnosis of influenza from Nasopharyngeal swab specimens and should not be  used as a sole basis for treatment. Nasal washings and aspirates are unacceptable for Xpert Xpress SARS-CoV-2/FLU/RSV testing.  Fact Sheet for Patients: EntrepreneurPulse.com.au  Fact Sheet for Healthcare Providers: IncredibleEmployment.be  This test is not yet approved or cleared by the [place] FDA and has been authorized for detection and/or diagnosis of SARS-CoV-2 by FDA under an Emergency Use Authorization (EUA). This EUA will remain in effect (meaning this test can be used) for the duration of the COVID-19 declaration under Section 564(b)(1) of the Act, 21 U.S.C. section 360bbb-3(b)(1), unless the authorization is terminated or revoked.  Performed at [facility], [address], [place], [place] [address]     Procedures and  diagnostic studies:  DG Chest Portable 1 View  Result Date: 05/25/2021 CLINICAL DATA:  Dialysis catheter complication. EXAM: PORTABLE CHEST 1 VIEW COMPARISON:  Chest radiograph 06/21/2016 FINDINGS: A right jugular catheter terminates over the high right atrium. A single lead ICD remains in place. The cardiac silhouette remains mildly enlarged. Lung volumes are low with minimal atelectasis in the lung bases. A trace right pleural effusion is not excluded. No pulmonary edema or pneumothorax is identified. No acute osseous abnormality is seen. IMPRESSION: Low lung volumes with minimal bibasilar atelectasis and possible trace right pleural effusion. Electronically Signed   By: Logan Bores M.D.   On: 05/25/2021 12:33    Medications:    amLODipine  10 mg Oral Daily   aspirin EC  81 mg Oral Daily   brimonidine  1 drop Right Eye TID   calcium acetate  1,334 mg Oral TID WC   carvedilol  12.5 mg Oral BID WC   Chlorhexidine Gluconate Cloth  6 each Topical Q0600   cloNIDine  0.1 mg Oral BID   clopidogrel  75 mg Oral Daily   dorzolamide-timolol  1 drop Right Eye BID   fenofibrate  160 mg Oral Daily   folic acid  1 mg Oral Daily   furosemide  80 mg Oral BID   guaiFENesin  600 mg Oral BID   heparin  5,000 Units Subcutaneous Q8H   heparin sodium (porcine)       hydrALAZINE  25 mg Oral TID   insulin aspart  0-5 Units Subcutaneous QHS   insulin aspart  0-9 Units Subcutaneous TID WC   latanoprost  1 drop Right Eye QHS   lidocaine-EPINEPHrine       molnupiravir EUA  4 capsule Oral BID   rosuvastatin  20 mg Oral Daily   traZODone  50 mg Oral QHS   Continuous Infusions:   LOS: 0 days   [name]  [facility]   How to contact the [facility] Attending or Consulting provider [place] or covering provider during after hours [place], for this patient?  Check the care team in [facility] and look for a) attending/consulting TRH provider listed and b) the [facility] team listed Log into [email or web address] and use [facility]'s universal password to access. If you do not have the password, please contact the hospital operator. Locate the [facility] provider you are looking for under [facility] and page to a number that you can be directly reached. If you still have difficulty reaching the provider, please page the [facility] (Director on Call) for the Hospitalists listed on amion for assistance.  05/26/2021, 2:02 PM

## 2021-05-27 DIAGNOSIS — T8249XA Other complication of vascular dialysis catheter, initial encounter: Secondary | ICD-10-CM | POA: Diagnosis not present

## 2021-05-27 DIAGNOSIS — T829XXA Unspecified complication of cardiac and vascular prosthetic device, implant and graft, initial encounter: Secondary | ICD-10-CM | POA: Diagnosis not present

## 2021-05-27 LAB — GLUCOSE, CAPILLARY
Glucose-Capillary: 151 mg/dL — ABNORMAL HIGH (ref 70–99)
Glucose-Capillary: 121 mg/dL — ABNORMAL HIGH (ref 70–99)

## 2021-05-27 MED ORDER — HYDRALAZINE HCL 25 MG PO TABS: 25.0000 mg | ORAL_TABLET | Freq: Two times a day (BID) | ORAL | Status: DC

## 2021-05-27 MED ORDER — LANTUS SOLOSTAR 100 UNIT/ML ~~LOC~~ SOPN: 20.0000 [IU] | PEN_INJECTOR | Freq: Every day | SUBCUTANEOUS | 11 refills | Status: DC

## 2021-05-27 MED ORDER — MOLNUPIRAVIR EUA 200MG CAPSULE: 4.0000 | ORAL_CAPSULE | Freq: Two times a day (BID) | ORAL | 0 refills | Status: AC

## 2021-05-27 NOTE — Plan of Care (Signed)
  Problem: Education: Goal: Knowledge of General Education information will improve Description Including pain rating scale, medication(s)/side effects and non-pharmacologic comfort measures Outcome: Progressing   

## 2021-05-27 NOTE — Discharge Summary (Signed)
Physician Discharge Summary   Patient: [name]. MRN: [id number] DOB: 02/11/1956  Admit date:     05/25/2021  Discharge date: 05/27/21  Discharge Physician: [name]   PCP: [name], MD   Recommendations at discharge:    Resume HD on Friday  Discharge Diagnoses: Principal Problem:   Complication associated with dialysis catheter Active Problems:   Hyperlipidemia   Essential hypertension   Peripheral vascular disease ([place])   DM (diabetes mellitus), type 2 with peripheral vascular complications ([place])   Chronic combined systolic (congestive) and diastolic (congestive) heart failure ([place])   ESRD (end stage renal disease) ([place])   COVID-19 virus infection  Resolved Problems:   * No resolved hospital problems. *    Assessment and Plan: * Complication associated with dialysis catheter- (present on admission) Catheter was not functioning during HD session 2/20 Plans were discussed by EDP with Vascular surgery and IR Patient will have dialysis catheter exchange on 2/22 by IR at [facility]   COVID-19 virus infection- (present on admission) Reports having cough No signs of pneumonia on imaging No hypoxia Treat supportively Start on molnupiravir Reports having 2 initial vaccines and one booster.  ESRD (end stage renal disease) ([place])- (present on admission) Last HD treatment was on Friday 2/17 Catheter did not function on 2/20 -HD 2/22 OPM-- due again on friday Nephrology aware  Chronic combined systolic (congestive) and diastolic (congestive) heart failure ([place])- (present on admission) Currently appears to be euvolemic He is on lasix, still makes some urine Volume management with HD  DM (diabetes mellitus), type 2 with peripheral vascular complications ([place])- (present on admission) Continue home regimen but at a lower dose as well controlled in the hospital  Peripheral vascular disease ([place])- (present on admission) S/p B/L BKA  Essential hypertension- (present  on admission) Continue home regimen  Carvedilol, amlodipine, clonidine, hydralazine  Hyperlipidemia- (present on admission) Continue statin      Consultants: renal/IR  Disposition: Home Diet recommendation:  Renal/carb mod  DISCHARGE MEDICATION: Allergies as of 05/27/2021       Reactions   Contrast Media [iodinated Contrast Media] Hives, Other (See Comments)   Happened "in the 80's"   Other Other (See Comments)   "Transfer Dye" = "Makes me tired"   Acetazolamide Anxiety, Other (See Comments)   Jittery, odd feeling (hyper feeling)        Medication List     STOP taking these medications    oxyCODONE-acetaminophen 5-325 MG tablet Commonly known as: Percocet       TAKE these medications    acetaminophen 500 MG tablet Commonly known as: TYLENOL Take 1,000 mg by mouth every 8 (eight) hours as needed for mild pain or headache.   amLODipine 10 MG tablet Commonly known as: NORVASC Take 10 mg by mouth at bedtime.   aspirin 81 MG tablet Take 81 mg by mouth at bedtime.   brimonidine 0.2 % ophthalmic solution Commonly known as: ALPHAGAN Place 1 drop into the right eye 3 (three) times daily.   calcium acetate 667 MG capsule Commonly known as: PHOSLO Take 667-1,334 mg by mouth See admin instructions. Take 1,334 mg by mouth three times a day with meals and 667 mg with each snack   carvedilol 12.5 MG tablet Commonly known as: COREG take 1 tablet by mouth twice daily.  DO NOT TAKE IN THE MORNING ON DIALYSIS DAYS What changed:  how much to take how to take this when to take this additional instructions   cloNIDine 0.1  MG tablet Commonly known as: CATAPRES Take 1 tablet (0.1 mg total) by mouth at bedtime. What changed: when to take this   clopidogrel 75 MG tablet Commonly known as: PLAVIX Take 75 mg by mouth at bedtime.   dorzolamide-timolol 22.3-6.8 MG/ML ophthalmic solution Commonly known as: COSOPT Place 1 drop into the right eye 2 (two) times  daily.   ergocalciferol 1.25 MG (50000 UT) capsule Commonly known as: VITAMIN D2 Take 50,000 Units by mouth every Monday.   fenofibrate 160 MG tablet Take 160 mg by mouth daily.   ferrous sulfate 325 (65 FE) MG tablet Take 325 mg by mouth daily with breakfast.   fluticasone 50 MCG/ACT nasal spray Commonly known as: FLONASE Place 1 spray into both nostrils at bedtime as needed for allergies or rhinitis.   folic acid 1 MG tablet Commonly known as: FOLVITE Take 1 mg by mouth at bedtime.   furosemide 20 MG tablet Commonly known as: LASIX Take 80 mg by mouth in the morning.   hydrALAZINE 25 MG tablet Commonly known as: APRESOLINE Take 1 tablet (25 mg total) by mouth in the morning and at bedtime.   insulin lispro 100 UNIT/ML KiwkPen Commonly known as: HUMALOG Inject 15 Units into the skin 3 (three) times daily before meals.   Lantus SoloStar 100 UNIT/ML Solostar Pen Generic drug: insulin glargine Inject 20 Units into the skin at bedtime. What changed: how much to take   latanoprost 0.005 % ophthalmic solution Commonly known as: XALATAN Place 1 drop into the right eye at bedtime.   molnupiravir EUA 200 mg Caps capsule Commonly known as: LAGEVRIO Take 4 capsules (800 mg total) by mouth 2 (two) times daily for 5 days.   rosuvastatin 20 MG tablet Commonly known as: CRESTOR Take 20 mg by mouth at bedtime.   traZODone 50 MG tablet Commonly known as: DESYREL Take 50 mg by mouth at bedtime.        Follow-up Information     [name], MD Follow up.   Specialty: Internal Medicine Contact information: [address] [place] [address] [phone]                 Discharge Exam: Danley Danker Weights   05/25/21 1743 05/27/21 0005 05/27/21 0256  Weight: 114 kg 113.7 kg 111.4 kg   In bed, NAD, not on O2  Condition at discharge: good  The results of significant diagnostics from this hospitalization (including imaging, microbiology, ancillary  and laboratory) are listed below for reference.   Imaging Studies: IR Fluoro Guide CV Line Right  Result Date: 05/26/2021 INDICATION: 66 year old gentleman with tunneled right IJ hemodialysis catheter presents to IR for exchange of nonfunctioning catheter. EXAM: 1. Fluoroscopy guided exchange of right IJ tunneled dialysis catheter. 2. Fluoroscopy guided balloon maceration of fibrin sheath. MEDICATIONS: None ANESTHESIA/SEDATION: None FLUOROSCOPY: Radiation Exposure Index (as provided by the fluoroscopic device): 299 mGy Kerma COMPLICATIONS: None immediate. PROCEDURE: Informed written consent was obtained from the patient after a thorough discussion of the procedural risks, benefits and alternatives. All questions were addressed. Maximal Sterile Barrier Technique was utilized including caps, mask, sterile gowns, sterile gloves, sterile drape, hand hygiene and skin antiseptic. A timeout was performed prior to the initiation of the procedure. External segment of right IJ tunneled hemodialysis catheter and surrounding skin prepped and draped in usual fashion. Following local lidocaine administration, the existing catheter was removed over 0.035 inch Amplatz guidewire. Kumpe catheter utilized to advance on post Glidewire to the level of the inferior  vena cava. 12 mm balloon utilized to macerate fibrin sheath within the right atrium and SVC. Contrast was not utilized due to patient's allergy. New 19 cm tunneled hemodialysis catheter was inserted with tip positioned in the right atrium. Both lumens aspirated and flushed well and were locked with heparin. The insert site was covered with sterile dressing. The catheter was secured to skin with suture. Pursestring suture applied at the insertion site. IMPRESSION: 1. Successful exchange of tunneled right IJ hemodialysis catheter for a new 19 cm catheter. Catheter ready for use. 2. Fibrin sheath maceration performed with 12 mm balloon. Electronically Signed   By: Miachel Roux M.D.   On: 05/26/2021 15:57   DG Chest Portable 1 View  Result Date: 05/25/2021 CLINICAL DATA:  Dialysis catheter complication. EXAM: PORTABLE CHEST 1 VIEW COMPARISON:  Chest radiograph 06/21/2016 FINDINGS: A right jugular catheter terminates over the high right atrium. A single lead ICD remains in place. The cardiac silhouette remains mildly enlarged. Lung volumes are low with minimal atelectasis in the lung bases. A trace right pleural effusion is not excluded. No pulmonary edema or pneumothorax is identified. No acute osseous abnormality is seen. IMPRESSION: Low lung volumes with minimal bibasilar atelectasis and possible trace right pleural effusion. Electronically Signed   By: Logan Bores M.D.   On: 05/25/2021 12:33   IR PTA VENOUS EXCEPT DIALYSIS CIRCUIT  Result Date: 05/26/2021 INDICATION: 66 year old gentleman with tunneled right IJ hemodialysis catheter presents to IR for exchange of nonfunctioning catheter. EXAM: 1. Fluoroscopy guided exchange of right IJ tunneled dialysis catheter. 2. Fluoroscopy guided balloon maceration of fibrin sheath. MEDICATIONS: None ANESTHESIA/SEDATION: None FLUOROSCOPY: Radiation Exposure Index (as provided by the fluoroscopic device): 786 mGy Kerma COMPLICATIONS: None immediate. PROCEDURE: Informed written consent was obtained from the patient after a thorough discussion of the procedural risks, benefits and alternatives. All questions were addressed. Maximal Sterile Barrier Technique was utilized including caps, mask, sterile gowns, sterile gloves, sterile drape, hand hygiene and skin antiseptic. A timeout was performed prior to the initiation of the procedure. External segment of right IJ tunneled hemodialysis catheter and surrounding skin prepped and draped in usual fashion. Following local lidocaine administration, the existing catheter was removed over 0.035 inch Amplatz guidewire. Kumpe catheter utilized to advance on post Glidewire to the level of the  inferior vena cava. 12 mm balloon utilized to macerate fibrin sheath within the right atrium and SVC. Contrast was not utilized due to patient's allergy. New 19 cm tunneled hemodialysis catheter was inserted with tip positioned in the right atrium. Both lumens aspirated and flushed well and were locked with heparin. The insert site was covered with sterile dressing. The catheter was secured to skin with suture. Pursestring suture applied at the insertion site. IMPRESSION: 1. Successful exchange of tunneled right IJ hemodialysis catheter for a new 19 cm catheter. Catheter ready for use. 2. Fibrin sheath maceration performed with 12 mm balloon. Electronically Signed   By: Miachel Roux M.D.   On: 05/26/2021 15:57   CUP PACEART REMOTE DEVICE CHECK  Result Date: 04/29/2021 Scheduled remote reviewed. Normal device function.  1 NSVT, no therapy Next remote 91 days. [place]   Microbiology: Results for orders placed or performed during the hospital encounter of 05/25/21  Resp Panel by RT-PCR (Flu A&B, Covid) Nasopharyngeal Swab     Status: Abnormal   Collection Time: 05/25/21  2:12 PM   Specimen: Nasopharyngeal Swab; Nasopharyngeal(NP) swabs in vial transport medium  Result Value Ref Range Status   SARS Coronavirus  2 by RT PCR POSITIVE (A) NEGATIVE Final    Comment: (NOTE) SARS-CoV-2 target nucleic acids are DETECTED.  The SARS-CoV-2 RNA is generally detectable in upper respiratory specimens during the acute phase of infection. Positive results are indicative of the presence of the identified virus, but do not rule out bacterial infection or co-infection with other pathogens not detected by the test. Clinical correlation with patient history and other diagnostic information is necessary to determine patient infection status. The expected result is Negative.  Fact Sheet for Patients: EntrepreneurPulse.com.au  Fact Sheet for Healthcare  Providers: IncredibleEmployment.be  This test is not yet approved or cleared by the [place] FDA and  has been authorized for detection and/or diagnosis of SARS-CoV-2 by FDA under an Emergency Use Authorization (EUA).  This EUA will remain in effect (meaning this test can be used) for the duration of  the COVID-19 declaration under Section 564(b)(1) of the A ct, 21 U.S.C. section 360bbb-3(b)(1), unless the authorization is terminated or revoked sooner.     Influenza A by PCR NEGATIVE NEGATIVE Final   Influenza B by PCR NEGATIVE NEGATIVE Final    Comment: (NOTE) The Xpert Xpress SARS-CoV-2/FLU/RSV plus assay is intended as an aid in the diagnosis of influenza from Nasopharyngeal swab specimens and should not be used as a sole basis for treatment. Nasal washings and aspirates are unacceptable for Xpert Xpress SARS-CoV-2/FLU/RSV testing.  Fact Sheet for Patients: EntrepreneurPulse.com.au  Fact Sheet for Healthcare Providers: IncredibleEmployment.be  This test is not yet approved or cleared by the [place] FDA and has been authorized for detection and/or diagnosis of SARS-CoV-2 by FDA under an Emergency Use Authorization (EUA). This EUA will remain in effect (meaning this test can be used) for the duration of the COVID-19 declaration under Section 564(b)(1) of the Act, 21 U.S.C. section 360bbb-3(b)(1), unless the authorization is terminated or revoked.  Performed at [facility], [address] [place], [place] [address]     Labs: CBC: Recent Labs  Lab 05/25/21 1224 05/26/21 0155  WBC 4.9 5.1  NEUTROABS 3.4  --   HGB 10.1* 10.2*  HCT 32.2* 31.7*  MCV 99.7 96.6  PLT 153 458*   Basic Metabolic Panel: Recent Labs  Lab 05/25/21 1224 05/26/21 0155  NA 140 142  K 5.1 5.1  CL 104 101  CO2 25 23  GLUCOSE 175* 98  BUN 76* 76*  CREATININE 10.90* 10.95*  CALCIUM 8.6* 8.7*  PHOS  --  7.0*   Liver  Function Tests: Recent Labs  Lab 05/26/21 0155  ALBUMIN 3.0*   CBG: Recent Labs  Lab 05/26/21 1101 05/26/21 1611 05/26/21 2138 05/27/21 0642 05/27/21 1118  GLUCAP 87 144* 160* 151* 121*    Discharge time spent: greater than 30 minutes.  Signed: [name], DO [facility] 05/27/2021

## 2021-05-27 NOTE — Progress Notes (Signed)
Patient requested to end treatment with 1 hour remaining due to feeling light headed. Patient sat upright in bed entire treatment and refused to have his back supported. Symptoms resolved once treatment ended. Patient departed room alert oriented vitals stable w/o complaint.

## 2021-05-27 NOTE — Progress Notes (Signed)
Contacted [facility] and spoke to [place]. Clinic aware pt to d/c today. Clinic states there may be some changes for pt's HD tomorrow/Monday. Clinic to f/u later today with pt regarding plans for HD tomorrow and Monday. Will fax d/c summary to clinic once one is available. Spoke to pt via phone. Pt aware that clinic will be contacting him later today regarding plans for pt's HD tomorrow. Pt agreeable.   Melven Sartorius Renal Navigator [phone]

## 2021-05-28 ENCOUNTER — Encounter (HOSPITAL_COMMUNITY)
Admission: RE | Admit: 2021-05-28 | Discharge: 2021-05-28 | Disposition: A | Discharge status: Home / Self Care | Payer: Medicare Other | Source: Ambulatory Visit | Attending: Vascular Surgery | Admitting: Vascular Surgery

## 2021-05-28 ENCOUNTER — Telehealth: Payer: Self-pay

## 2021-05-28 NOTE — Telephone Encounter (Signed)
Patient tested positive for Covid on 05/25/21. Spoke with patient to r/s left arm AVG insertion for 06/15/21 with Dr. Donnetta Hutching at [facility]. Instructions reviewed. Pt verbalized understanding.

## 2021-06-09 ENCOUNTER — Telehealth: Payer: Self-pay

## 2021-06-09 NOTE — Telephone Encounter (Signed)
Ramiro Harvest, Pa called to let us know pt has a possible catheter infection. This would be his third infection. He is scheduled for AVG next week but is wanting to r/s that due to infection. Ramiro Harvest advised him to move forward with surgery but pt prefers not to. I have made MD aware.  ?

## 2021-06-11 ENCOUNTER — Encounter (HOSPITAL_COMMUNITY)
Admission: RE | Admit: 2021-06-11 | Discharge: 2021-06-11 | Disposition: A | Discharge status: Home / Self Care | Payer: Medicare Other | Source: Ambulatory Visit | Attending: Vascular Surgery | Admitting: Vascular Surgery

## 2021-06-14 NOTE — Telephone Encounter (Signed)
Patient rescheduled to 06/29/21 at [facility] per Dr. Donnetta Hutching. Spoke with patient and informed him of this information. Instructions reviewed. Patient verbalized understanding.  ?

## 2021-06-25 ENCOUNTER — Encounter (HOSPITAL_COMMUNITY)
Admission: RE | Admit: 2021-06-25 | Discharge: 2021-06-25 | Disposition: A | Discharge status: Home / Self Care | Payer: Medicare Other | Source: Ambulatory Visit | Attending: Vascular Surgery | Admitting: Vascular Surgery

## 2021-06-25 NOTE — Pre-Procedure Instructions (Signed)
Attempted pre-op phone call. Has no voicemail set up. ?

## 2021-06-28 ENCOUNTER — Encounter (HOSPITAL_COMMUNITY): Payer: Self-pay

## 2021-06-28 ENCOUNTER — Other Ambulatory Visit: Payer: Self-pay

## 2021-06-29 ENCOUNTER — Ambulatory Visit (HOSPITAL_COMMUNITY): Payer: Medicare Other | Admitting: Anesthesiology

## 2021-06-29 ENCOUNTER — Other Ambulatory Visit: Payer: Self-pay

## 2021-06-29 ENCOUNTER — Ambulatory Visit (HOSPITAL_BASED_OUTPATIENT_CLINIC_OR_DEPARTMENT_OTHER): Payer: Medicare Other | Admitting: Anesthesiology

## 2021-06-29 ENCOUNTER — Encounter (HOSPITAL_COMMUNITY): Payer: Self-pay | Admitting: Vascular Surgery

## 2021-06-29 ENCOUNTER — Encounter (HOSPITAL_COMMUNITY)
Admission: RE | Disposition: A | Discharge status: Home / Self Care | Payer: Self-pay | Source: Home / Self Care | Attending: Vascular Surgery

## 2021-06-29 ENCOUNTER — Ambulatory Visit (HOSPITAL_COMMUNITY)
Admission: RE | Admit: 2021-06-29 | Discharge: 2021-06-29 | Disposition: A | Discharge status: Home / Self Care | Payer: Medicare Other | Attending: Vascular Surgery | Admitting: Vascular Surgery

## 2021-06-29 DIAGNOSIS — D649 Anemia, unspecified: Secondary | ICD-10-CM | POA: Diagnosis not present

## 2021-06-29 DIAGNOSIS — I509 Heart failure, unspecified: Secondary | ICD-10-CM | POA: Insufficient documentation

## 2021-06-29 DIAGNOSIS — N185 Chronic kidney disease, stage 5: Secondary | ICD-10-CM | POA: Diagnosis not present

## 2021-06-29 DIAGNOSIS — N186 End stage renal disease: Secondary | ICD-10-CM

## 2021-06-29 DIAGNOSIS — I69954 Hemiplegia and hemiparesis following unspecified cerebrovascular disease affecting left non-dominant side: Secondary | ICD-10-CM | POA: Insufficient documentation

## 2021-06-29 DIAGNOSIS — I132 Hypertensive heart and chronic kidney disease with heart failure and with stage 5 chronic kidney disease, or end stage renal disease: Secondary | ICD-10-CM | POA: Insufficient documentation

## 2021-06-29 DIAGNOSIS — Z7984 Long term (current) use of oral hypoglycemic drugs: Secondary | ICD-10-CM | POA: Insufficient documentation

## 2021-06-29 DIAGNOSIS — Z452 Encounter for adjustment and management of vascular access device: Secondary | ICD-10-CM | POA: Insufficient documentation

## 2021-06-29 DIAGNOSIS — D759 Disease of blood and blood-forming organs, unspecified: Secondary | ICD-10-CM | POA: Insufficient documentation

## 2021-06-29 DIAGNOSIS — Z794 Long term (current) use of insulin: Secondary | ICD-10-CM | POA: Insufficient documentation

## 2021-06-29 DIAGNOSIS — E1151 Type 2 diabetes mellitus with diabetic peripheral angiopathy without gangrene: Secondary | ICD-10-CM | POA: Diagnosis not present

## 2021-06-29 DIAGNOSIS — E1122 Type 2 diabetes mellitus with diabetic chronic kidney disease: Secondary | ICD-10-CM | POA: Diagnosis not present

## 2021-06-29 DIAGNOSIS — Z95810 Presence of automatic (implantable) cardiac defibrillator: Secondary | ICD-10-CM | POA: Insufficient documentation

## 2021-06-29 HISTORY — PX: AV FISTULA PLACEMENT: SHX1204

## 2021-06-29 LAB — GLUCOSE, CAPILLARY
Glucose-Capillary: 123 mg/dL — ABNORMAL HIGH (ref 70–99)
Glucose-Capillary: 117 mg/dL — ABNORMAL HIGH (ref 70–99)

## 2021-06-29 LAB — POCT I-STAT, CHEM 8
BUN: 32 mg/dL — ABNORMAL HIGH (ref 8–23)
Calcium, Ion: 1.11 mmol/L — ABNORMAL LOW (ref 1.15–1.40)
Chloride: 100 mmol/L (ref 98–111)
Creatinine, Ser: 7.2 mg/dL — ABNORMAL HIGH (ref 0.61–1.24)
Glucose, Bld: 117 mg/dL — ABNORMAL HIGH (ref 70–99)
HCT: 32 % — ABNORMAL LOW (ref 39.0–52.0)
Hemoglobin: 10.9 g/dL — ABNORMAL LOW (ref 13.0–17.0)
Potassium: 4.3 mmol/L (ref 3.5–5.1)
Sodium: 138 mmol/L (ref 135–145)
TCO2: 29 mmol/L (ref 22–32)

## 2021-06-29 SURGERY — INSERTION OF ARTERIOVENOUS (AV) GORE-TEX GRAFT ARM
Anesthesia: General | Site: Arm Upper | Laterality: Left

## 2021-06-29 MED ORDER — DEXMEDETOMIDINE (PRECEDEX) IN NS 20 MCG/5ML (4 MCG/ML) IV SYRINGE
PREFILLED_SYRINGE | INTRAVENOUS | Status: DC | PRN
  Administered 2021-06-29 (×2): 4 ug via INTRAVENOUS

## 2021-06-29 MED ORDER — ONDANSETRON HCL 4 MG/2ML IJ SOLN: 4.0000 mg | Freq: Once | INTRAMUSCULAR | Status: DC | PRN

## 2021-06-29 MED ORDER — 0.9 % SODIUM CHLORIDE (POUR BTL) OPTIME
TOPICAL | Status: DC | PRN
  Administered 2021-06-29: 1000 mL

## 2021-06-29 MED ORDER — SODIUM CHLORIDE 0.9 % IV SOLN: INTRAVENOUS | Status: DC

## 2021-06-29 MED ORDER — PROPOFOL 10 MG/ML IV BOLUS
INTRAVENOUS | Status: AC
  Filled 2021-06-29: qty 20

## 2021-06-29 MED ORDER — CHLORHEXIDINE GLUCONATE 4 % EX LIQD: 60.0000 mL | Freq: Once | CUTANEOUS | Status: DC

## 2021-06-29 MED ORDER — LIDOCAINE-EPINEPHRINE 0.5 %-1:200000 IJ SOLN
INTRAMUSCULAR | Status: AC
  Filled 2021-06-29: qty 1

## 2021-06-29 MED ORDER — STERILE WATER FOR IRRIGATION IR SOLN
Status: DC | PRN
  Administered 2021-06-29: 1000 mL

## 2021-06-29 MED ORDER — FENTANYL CITRATE (PF) 100 MCG/2ML IJ SOLN
INTRAMUSCULAR | Status: AC
  Filled 2021-06-29: qty 2

## 2021-06-29 MED ORDER — FENTANYL CITRATE PF 50 MCG/ML IJ SOSY: 25.0000 ug | PREFILLED_SYRINGE | INTRAMUSCULAR | Status: DC | PRN

## 2021-06-29 MED ORDER — LIDOCAINE-EPINEPHRINE 0.5 %-1:200000 IJ SOLN
INTRAMUSCULAR | Status: DC | PRN
Start: 2021-06-29 — End: 2021-06-29
  Administered 2021-06-29: 20 mL

## 2021-06-29 MED ORDER — HEPARIN 6000 UNIT IRRIGATION SOLUTION
Status: DC | PRN
Start: 2021-06-29 — End: 2021-06-29
  Administered 2021-06-29: 1

## 2021-06-29 MED ORDER — CEFAZOLIN SODIUM-DEXTROSE 2-4 GM/100ML-% IV SOLN
2.0000 g | INTRAVENOUS | Status: AC
  Administered 2021-06-29: 2 g via INTRAVENOUS
  Filled 2021-06-29: qty 100

## 2021-06-29 MED ORDER — HEPARIN SODIUM (PORCINE) 1000 UNIT/ML IJ SOLN
INTRAMUSCULAR | Status: AC
  Filled 2021-06-29: qty 6

## 2021-06-29 MED ORDER — LIDOCAINE 2% (20 MG/ML) 5 ML SYRINGE
INTRAMUSCULAR | Status: DC | PRN
  Administered 2021-06-29: 80 mg via INTRAVENOUS

## 2021-06-29 MED ORDER — FENTANYL CITRATE (PF) 100 MCG/2ML IJ SOLN
INTRAMUSCULAR | Status: DC | PRN
  Administered 2021-06-29 (×2): 25 ug via INTRAVENOUS
  Administered 2021-06-29: 50 ug via INTRAVENOUS

## 2021-06-29 MED ORDER — PROPOFOL 500 MG/50ML IV EMUL
INTRAVENOUS | Status: DC | PRN
  Administered 2021-06-29: 50 ug/kg/min via INTRAVENOUS

## 2021-06-29 MED ORDER — LIDOCAINE HCL (PF) 2 % IJ SOLN
INTRAMUSCULAR | Status: AC
  Filled 2021-06-29: qty 5

## 2021-06-29 MED ORDER — DEXMEDETOMIDINE (PRECEDEX) IN NS 20 MCG/5ML (4 MCG/ML) IV SYRINGE
PREFILLED_SYRINGE | INTRAVENOUS | Status: AC
  Filled 2021-06-29: qty 5

## 2021-06-29 MED ORDER — OXYCODONE-ACETAMINOPHEN 5-325 MG PO TABS: 1.0000 | ORAL_TABLET | Freq: Four times a day (QID) | ORAL | 0 refills | Status: DC | PRN

## 2021-06-29 SURGICAL SUPPLY — 45 items
ADH SKN CLS APL DERMABOND .7 (GAUZE/BANDAGES/DRESSINGS) ×1
ARMBAND PINK RESTRICT EXTREMIT (MISCELLANEOUS) ×2 IMPLANT
BAG HAMPER (MISCELLANEOUS) ×2 IMPLANT
BLADE SURG 15 STRL LF DISP TIS (BLADE) IMPLANT
BLADE SURG 15 STRL SS (BLADE) ×2
CANNULA VESSEL 3MM 2 BLNT TIP (CANNULA) ×2 IMPLANT
CLIP LIGATING EXTRA MED SLVR (CLIP) ×2 IMPLANT
CLIP LIGATING EXTRA SM BLUE (MISCELLANEOUS) ×2 IMPLANT
COVER LIGHT HANDLE STERIS (MISCELLANEOUS) ×4 IMPLANT
COVER MAYO STAND XLG (MISCELLANEOUS) ×2 IMPLANT
DECANTER SPIKE VIAL GLASS SM (MISCELLANEOUS) ×2 IMPLANT
DERMABOND ADVANCED (GAUZE/BANDAGES/DRESSINGS) ×1
DERMABOND ADVANCED .7 DNX12 (GAUZE/BANDAGES/DRESSINGS) ×1 IMPLANT
ELECT REM PT RETURN 9FT ADLT (ELECTROSURGICAL) ×2
ELECTRODE REM PT RTRN 9FT ADLT (ELECTROSURGICAL) ×1 IMPLANT
GAUZE SPONGE 4X4 12PLY STRL (GAUZE/BANDAGES/DRESSINGS) ×4 IMPLANT
GLOVE SURG MICRO LTX SZ7.5 (GLOVE) ×2 IMPLANT
GLOVE SURG POLYISO LF SZ7 (GLOVE) ×1 IMPLANT
GLOVE SURG UNDER POLY LF SZ7 (GLOVE) ×6 IMPLANT
GLOVE SURG UNDER POLY LF SZ7.5 (GLOVE) ×1 IMPLANT
GOWN STRL REUS W/TWL LRG LVL3 (GOWN DISPOSABLE) ×6 IMPLANT
GRAFT GORETEX STRT 4-7X45 (Vascular Products) ×1 IMPLANT
IV NS 500ML (IV SOLUTION) ×2
IV NS 500ML BAXH (IV SOLUTION) ×2 IMPLANT
KIT BLADEGUARD II DBL (SET/KITS/TRAYS/PACK) ×2 IMPLANT
KIT TURNOVER KIT A (KITS) ×2 IMPLANT
MANIFOLD NEPTUNE II (INSTRUMENTS) ×2 IMPLANT
MARKER SKIN DUAL TIP RULER LAB (MISCELLANEOUS) ×4 IMPLANT
NDL HYPO 18GX1.5 BLUNT FILL (NEEDLE) ×1 IMPLANT
NEEDLE HYPO 18GX1.5 BLUNT FILL (NEEDLE) ×2 IMPLANT
NS IRRIG 1000ML POUR BTL (IV SOLUTION) ×2 IMPLANT
PACK CV ACCESS (CUSTOM PROCEDURE TRAY) ×2 IMPLANT
PAD ARMBOARD 7.5X6 YLW CONV (MISCELLANEOUS) ×2 IMPLANT
SET BASIN LINEN APH (SET/KITS/TRAYS/PACK) ×2 IMPLANT
SOL PREP POV-IOD 4OZ 10% (MISCELLANEOUS) ×2 IMPLANT
SOL PREP PROV IODINE SCRUB 4OZ (MISCELLANEOUS) ×2 IMPLANT
SPONGE T-LAP 18X18 ~~LOC~~+RFID (SPONGE) ×2 IMPLANT
SUT PROLENE 6 0 CC (SUTURE) ×5 IMPLANT
SUT SILK 2 0 FSL 18 (SUTURE) ×2 IMPLANT
SUT VIC AB 3-0 SH 27 (SUTURE) ×4
SUT VIC AB 3-0 SH 27X BRD (SUTURE) ×1 IMPLANT
SYR 10ML LL (SYRINGE) ×2 IMPLANT
SYR CONTROL 10ML LL (SYRINGE) ×2 IMPLANT
UNDERPAD 30X36 HEAVY ABSORB (UNDERPADS AND DIAPERS) ×2 IMPLANT
WATER STERILE IRR 1000ML POUR (IV SOLUTION) ×1 IMPLANT

## 2021-06-29 NOTE — Op Note (Signed)
? ? ?  OPERATIVE REPORT ? ?DATE OF SURGERY: 06/29/2021 ? ?PATIENT: [name] Pick., 66 y.o. male ?MRN: [id number]  ?DOB: 24-May-1955 ? ?PRE-OPERATIVE DIAGNOSIS: End-stage renal disease ? ?POST-OPERATIVE DIAGNOSIS:  Same ? ?PROCEDURE: Left upper arm AV Gore-Tex graft placement ? ?SURGEON:  Curt Jews, M.D. ? ?PHYSICIAN ASSISTANT: Vevelyn Royals, RN ? ?The assistant was needed for exposure and to expedite the case ? ?ANESTHESIA: Local with sedation ? ?EBL: per anesthesia record ? ?Total I/O ?In: 600 [I.V.:600] ?Out: 25 [Blood:25] ? ?BLOOD ADMINISTERED: none ? ?DRAINS: none ? ?SPECIMEN: none ? ?COUNTS CORRECT:  YES ? ?PATIENT DISPOSITION:  PACU - hemodynamically stable ? ?PROCEDURE DETAILS: ?The patient was taken to the operating placed supine position where the area of the left arm left axilla were prepped and draped you sterile fashion.  Incision was made using local anesthesia over the prior scar in the antecubital space.  The patient had an old basilic vein brachio basilic fistula.  This vein was actually patent.  The brachial artery was exposed for control.  Next a separate incision was made over the axilla and the axillary vein was identified and was of good caliber.  A subcutaneous tunnel was created from the antecubital space to the axilla and a 4 x 7 mm tapered Gore-Tex graft was brought through the tunnel.  The brachial artery was occluded proximally and distally and the old venous anastomosis was excised.  The 4 mm portion of the graft was spatulated to approximately 5 mm and this was sewn end-to-side to the brachial artery with a running 6-0 Prolene suture.  This anastomosis was tested and found to be adequate.  The graft was flushed with heparinized saline and reoccluded.  Next the axillary vein was occluded proximally distally.  The graft was cut to the appropriate length and was sewn end-to-side to the vein with a running 6-0 Prolene suture.  Clamps were removed and excellent thrill was noted.  The wounds  irrigated with saline.  Hemostasis attained after cautery.  Wounds were closed with 3-0 Vicryl in the subcutaneous and subcuticular tissue.  Dermabond was applied and the patient was transferred to the recovery room in stable condition ? ? ?Rosetta Posner, M.D., FACS ?06/29/2021 ?12:59 PM ? ?Note: Portions of this report may have been transcribed using voice recognition software.  Every effort has been made to ensure accuracy; however, inadvertent computerized transcription errors may still be present. ? ? ?

## 2021-06-29 NOTE — Discharge Instructions (Signed)
? ?[facility] ? ?Discharge Instructions ? ?AV Fistula or Graft Surgery for Dialysis Access ? ?Please refer to the following instructions for your post-procedure care. Your surgeon or physician assistant will discuss any changes with you. ? ?Activity ? ?You may drive the day following your surgery, if you are comfortable and no longer taking prescription pain medication. Resume full activity as the soreness in your incision resolves. ? ?Bathing/Showering ? ?You may shower after you go home. Keep your incision dry for 48 hours. Do not soak in a bathtub, hot tub, or swim until the incision heals completely. You may not shower if you have a hemodialysis catheter. ? ?Incision Care ? ?Clean your incision with mild soap and water after 48 hours. Pat the area dry with a clean towel. You do not need a bandage unless otherwise instructed. Do not apply any ointments or creams to your incision. You may have skin glue on your incision. Do not peel it off. It will come off on its own in about one week. Your arm may swell a bit after surgery. To reduce swelling use pillows to elevate your arm so it is above your heart. Your doctor will tell you if you need to lightly wrap your arm with an ACE bandage. ? ?Diet ? ?Resume your normal diet. There are not special food restrictions following this procedure. In order to heal from your surgery, it is CRITICAL to get adequate nutrition. Your body requires vitamins, minerals, and protein. Vegetables are the best source of vitamins and minerals. Vegetables also provide the perfect balance of protein. Processed food has little nutritional value, so try to avoid this. ? ?Medications ? ?Resume taking all of your medications. If your incision is causing pain, you may take over-the counter pain relievers such as acetaminophen (Tylenol). If you were prescribed a stronger pain medication, please be aware these medications can cause nausea and constipation. Prevent  nausea by taking the medication with a snack or meal. Avoid constipation by drinking plenty of fluids and eating foods with high amount of fiber, such as fruits, vegetables, and grains.  ?Do not take Tylenol if you are taking prescription pain medications. ? ?Follow up ?Your surgeon may want to see you in the office following your access surgery. If so, this will be arranged at the time of your surgery. ? ?Please call us immediately for any of the following conditions: ? ?Increased pain, redness, drainage (pus) from your incision site ?Fever of 101 degrees or higher ?Severe or worsening pain at your incision site ?Hand pain or numbness. ? ?Reduce your risk of vascular disease: ? ?Stop smoking. If you would like help, call [facility] at 1-800-QUIT-NOW 660-763-5561) or [facility] at 340-434-6089 ? ?Manage your cholesterol ?Maintain a desired weight ?Control your diabetes ?Keep your blood pressure down ? ?Dialysis ? ?It will take several weeks to several months for your new dialysis access to be ready for use. Your surgeon will determine when it is okay to use it. Your nephrologist will continue to direct your dialysis. You can continue to use your Permcath until your new access is ready for use. ? ? ?06/29/2021 ?[name]. ?[id number] ?1955-11-28 ? ?Surgeon(s): ?Brynlei Klausner, Arvilla Meres, MD ? ?Procedure(s): ?INSERTION OF LEFT ARM ARTERIOVENOUS (AV) GORE-TEX GRAFT ? ? May stick graft immediately  ? May stick graft on designated area only:   ? Do not stick graft for 4 weeks  ? ? ?If you have any questions, please call the  office at [phone]. ? ?

## 2021-06-29 NOTE — Transfer of Care (Signed)
Immediate Anesthesia Transfer of Care Note ? ?Patient: [name]. ? ?Procedure(s) Performed: INSERTION OF LEFT ARM ARTERIOVENOUS (AV) GORE-TEX GRAFT (Left: Arm Upper) ? ?Patient Location: PACU ? ?Anesthesia Type:MAC ? ?Level of Consciousness: awake, alert , oriented and patient cooperative ? ?Airway & Oxygen Therapy: Patient Spontanous Breathing ? ?Post-op Assessment: Report given to RN, Post -op Vital signs reviewed and stable and Patient moving all extremities ? ?Post vital signs: Reviewed and stable ? ?Last Vitals:  ?Vitals Value Taken Time  ?BP 116/66 06/29/21 0946  ?Temp    ?Pulse 66 06/29/21 0950  ?Resp 21 06/29/21 0950  ?SpO2 97 % 06/29/21 0950  ?Vitals shown include unvalidated device data. ? ?Last Pain:  ?Vitals:  ? 06/29/21 0710  ?PainSc: 0-No pain  ?   ? ?  ? ?Complications: No notable events documented. ?

## 2021-06-29 NOTE — Anesthesia Postprocedure Evaluation (Signed)
Anesthesia Post Note ? ?Patient: [name]. ? ?Procedure(s) Performed: INSERTION OF LEFT ARM ARTERIOVENOUS (AV) GORE-TEX GRAFT (Left: Arm Upper) ? ?Patient location during evaluation: Phase II ?Anesthesia Type: General ?Level of consciousness: awake ?Pain management: pain level controlled ?Vital Signs Assessment: post-procedure vital signs reviewed and stable ?Respiratory status: spontaneous breathing and respiratory function stable ?Cardiovascular status: blood pressure returned to baseline and stable ?Postop Assessment: no headache and no apparent nausea or vomiting ?Anesthetic complications: no ?Comments: Late entry ? ? ?No notable events documented. ? ? ?Last Vitals:  ?Vitals:  ? 06/29/21 1000 06/29/21 1015  ?BP: (!) 129/59 (!) 170/106  ?Pulse:    ?Resp: 18 18  ?Temp:  36.7 ?C  ?SpO2: 96% 98%  ?  ?Last Pain:  ?Vitals:  ? 06/29/21 1031  ?TempSrc: Oral  ?PainSc: 0-No pain  ? ? ?  ?  ?  ?  ?  ?  ? ?Louann Sjogren ? ? ? ? ?

## 2021-06-29 NOTE — H&P (Signed)
?  ?Office Visit ?05/19/2021 ?[facility] ?Rosetta Posner, MD ?Vascular Surgery ESRD (end stage renal disease) ([place]) ?Dx Referred by [name], MD ?Reason for Visit  ? ?Additional Documentation ? ?Vitals:  BP 147/68 Important   (BP Location: Right Arm, Patient Position: Sitting, Cuff Size: Normal) ?Pulse 74 ?Temp 98.2 ?F (36.8 ?C) (Temporal) ?Resp 20 ?Ht 6' (1.829 m) ?Wt 115.2 kg ?SpO2 95% ?BMI 34.45 kg/m? ?BSA 2.42 m?  ?Flowsheets:  Vital Signs, ?NEWS, ?MEWS Score, ?Anthropometrics, ?Method of Visit ?  ?Encounter Info:  Billing Info, ?History, ?Allergies, ?Detailed Report ?  ? ?All Notes ? ? Progress Notes by Rosetta Posner, MD at 05/19/2021 8:30 AM ? ?Author: Rosetta Posner, MD Author Type: Physician Filed: 05/19/2021 10:51 AM  ?Note Status: Signed Cosign: Cosign Not Required Encounter Date: 05/19/2021  ?Editor: Rosetta Posner, MD (Physician)      ?       ?                                   ?  ?[facility] [place] ?  ?Patient name: [name].        MRN: [id number]        DOB: 13-Sep-1955          Sex: male ?  ?REASON FOR VISIT: Follow-up wound left arm ?  ?HPI: ?[name]. is a 66 y.o. male here today for follow-up.  His incision is essentially healed.  He had a very large defect which is now completely closed and with some slight scaling over the scar.  He does report a tight feeling in the wound itself and some numbness in his medial forearm.   ?  ?      ?Current Outpatient Medications  ?Medication Sig Dispense Refill  ? acetaminophen (TYLENOL) 500 MG tablet Take 1,000 mg by mouth every 8 (eight) hours as needed for mild pain or headache.       ? amLODipine (NORVASC) 10 MG tablet Take 10 mg by mouth daily. for high blood pressure   1  ? aspirin 81 MG tablet Take 81 mg by mouth daily.        ? brimonidine (ALPHAGAN) 0.2 % ophthalmic solution Place 1 drop into the right eye 3 (three) times daily.      ? calcium acetate (PHOSLO) 667 MG capsule Take 667-1,334 mg  by mouth See admin instructions. 2 caps three times daily with meals, and 1 capsule with snacks   0  ? carvedilol (COREG) 12.5 MG tablet take 1 tablet by mouth twice daily.  DO NOT TAKE IN THE MORNING ON DIALYSIS DAYS 60 tablet 0  ? cloNIDine (CATAPRES) 0.1 MG tablet Take 1 tablet (0.1 mg total) by mouth at bedtime. (Patient taking differently: Take 0.1 mg by mouth 2 (two) times daily.)      ? clopidogrel (PLAVIX) 75 MG tablet Take 75 mg by mouth daily.        ? dorzolamide-timolol (COSOPT) 22.3-6.8 MG/ML ophthalmic solution Place 1 drop into the right eye 2 (two) times daily.      ? fenofibrate 160 MG tablet Take 160 mg by mouth daily.   0  ? fluticasone (FLONASE) 50 MCG/ACT nasal spray Place 1 spray into both nostrils daily as needed for allergies.      ? folic acid (FOLVITE) 1 MG tablet Take 1 mg by  mouth daily.      ? furosemide (LASIX) 20 MG tablet Take 60-80 mg by mouth 2 (two) times daily. Take 3 tabs twice daily on NON DIALYSIS DAYS      ? hydrALAZINE (APRESOLINE) 25 MG tablet Take 1 tablet (25 mg total) by mouth 3 (three) times daily. Please make overdue appt with Dr. Harrington Challenger before anymore refills. 1st attempt 90 tablet 0  ? insulin lispro (HUMALOG) 100 UNIT/ML KiwkPen Inject 15-20 Units into the skin 3 (three) times daily. Give per sliding scale      ? LANTUS SOLOSTAR 100 UNIT/ML Solostar Pen Inject 50 Units into the skin at bedtime.      ? latanoprost (XALATAN) 0.005 % ophthalmic solution Place 1 drop into the right eye at bedtime.      ? oxyCODONE-acetaminophen (PERCOCET) 5-325 MG tablet Take 1 tablet by mouth every 6 (six) hours as needed for severe pain. 30 tablet 0  ? rosuvastatin (CRESTOR) 20 MG tablet Take 20 mg by mouth daily.   0  ? traZODone (DESYREL) 50 MG tablet Take 50-100 mg by mouth at bedtime.      ?  ?No current facility-administered medications for this visit.  ?  ?  ?  ?PHYSICAL EXAM: ?   ?Vitals:  ?  05/19/21 0828  ?BP: (!) 147/68  ?Pulse: 74  ?Resp: 20  ?Temp: 98.2 ?F (36.8 ?C)   ?TempSrc: Temporal  ?SpO2: 95%  ?Weight: 254 lb (115.2 kg)  ?Height: 6' (1.829 m)  ?  ?  ?GENERAL: The patient is a well-nourished male, in no acute distress. The vital signs are documented above. ?Wound healed in his left medial upper arm ?  ?MEDICAL ISSUES: ?I again discussed options.  The patient continues to have no difficulty with his tunneled right IJ hemodialysis catheter.  He is quite insistent that he does not want right arm access.  He does have a pacer/AICD in his left subclavian but has never had any difficulty with his left arm from a swelling standpoint.  We will schedule placement of a left arm AV Gore-Tex graft on 06/01/2021 at [facility] ?  ?  ?Rosetta Posner, MD FACS ?[facility] [place] ?Office Tel [phone] ?  ?Note: Portions of this report may have been transcribed using voice recognition software.  Every effort has been made to ensure accuracy; however, inadvertent computerized transcription errors may still be present. ?  ?  ? ? ?Addendum: ? ?The patient has been re-examined and re-evaluated.  The patient's history and physical has been reviewed and is unchanged.   ? ?[name]. is a 66 y.o. male is being admitted with ESRD. All the risks, benefits and other treatment options have been discussed with the patient. The patient has consented to proceed with Procedure(s): ?INSERTION OF LEFT ARM ARTERIOVENOUS (AV) GORE-TEX GRAFT as a surgical intervention. ? ?Reinette Cuneo ?06/29/2021 ?7:17 AM ?Vascular and Vein Surgery ?  ?

## 2021-06-29 NOTE — Anesthesia Preprocedure Evaluation (Addendum)
Anesthesia Evaluation  ?Patient identified by MRN, date of birth, ID band ?Patient awake ? ? ? ?Reviewed: ?Allergy & Precautions, NPO status , Patient's Chart, lab work & pertinent test results, reviewed documented beta blocker date and time  ? ?Airway ?Mallampati: II ? ? ?Neck ROM: Full ? ? ? Dental ? ?(+) Dental Advisory Given, Missing ?  ?Pulmonary ?shortness of breath and with exertion, former smoker,  ?  ?Pulmonary exam normal ?breath sounds clear to auscultation ? ? ? ? ? ? Cardiovascular ?Exercise Tolerance: Poor ?hypertension, Pt. on medications and Pt. on home beta blockers ?+ Peripheral Vascular Disease, +CHF and + DOE  ?Normal cardiovascular exam+ Cardiac Defibrillator ? ?Rhythm:Regular Rate:Normal ? ?Left ventricle: The cavity size was moderately dilated. There was moderate concentric hypertrophy. Systolic function was moderately ?reduced. The estimated ejection fraction was in the range of 35% to 40%. There is akinesis of the basal-midanteroseptal and  ???inferoseptal myocardium. Features are consistent with a  ???pseudonormal left ventricular filling pattern, with concomitant abnormal relaxation and increased filling pressure (grade 2 ?diastolic dysfunction).  ?- Ventricular septum: Septal motion showed mild paradox. These  ???changes are consistent with right ventricular pacing.  ?- Mitral valve: Calcified annulus. Mildly thickened leaflets .  ?- Left atrium: The atrium was mildly dilated. Anterior-posterior dimension: 43 mm.  ?- Right ventricle: Pacer wire or catheter noted in right ventricle.  ?  ?Neuro/Psych ?CVA (left sided weakness), Residual Symptoms negative psych ROS  ? GI/Hepatic ?negative GI ROS, Neg liver ROS,   ?Endo/Other  ?diabetes, Well Controlled, Type 2, Oral Hypoglycemic Agents, Insulin Dependent ? Renal/GU ?ESRF and DialysisRenal disease  ? ?  ?Musculoskeletal ? ?(+) Arthritis , Osteoarthritis,   ? Abdominal ?  ?Peds ? Hematology ? ?(+) Blood  dyscrasia, anemia ,   ?Anesthesia Other Findings ? ? Reproductive/Obstetrics ? ?  ? ? ? ? ? ? ? ? ? ? ? ? ? ?  ?  ? ? ? ? ? ? ? ? ?Anesthesia Physical ? ?Anesthesia Plan ? ?ASA: 3 ? ?Anesthesia Plan: General  ? ?Post-op Pain Management:   ? ?Induction: Intravenous ? ?PONV Risk Score and Plan: 3 and Propofol infusion ? ?Airway Management Planned: Nasal Cannula ? ?Additional Equipment:  ? ?Intra-op Plan:  ? ?Post-operative Plan:  ? ?Informed Consent: I have reviewed the patients History and Physical, chart, labs and discussed the procedure including the risks, benefits and alternatives for the proposed anesthesia with the patient or authorized representative who has indicated his/her understanding and acceptance.  ? ? ? ?Dental advisory given ? ?Plan Discussed with: CRNA and Surgeon ? ?Anesthesia Plan Comments:   ? ? ? ? ? ?Anesthesia Quick Evaluation ? ?

## 2021-07-01 ENCOUNTER — Encounter (HOSPITAL_COMMUNITY): Payer: Self-pay | Admitting: Vascular Surgery

## 2021-07-28 ENCOUNTER — Ambulatory Visit (INDEPENDENT_AMBULATORY_CARE_PROVIDER_SITE_OTHER): Payer: Medicare Other | Admitting: Vascular Surgery

## 2021-07-28 ENCOUNTER — Encounter: Payer: Self-pay | Admitting: Vascular Surgery

## 2021-07-28 ENCOUNTER — Encounter: Payer: Medicare Other | Admitting: Vascular Surgery

## 2021-07-28 VITALS — BP 141/69 | HR 74 | Temp 98.4°F | Resp 14 | Ht 72.0 in | Wt 250.0 lb

## 2021-07-28 DIAGNOSIS — N186 End stage renal disease: Secondary | ICD-10-CM

## 2021-07-28 NOTE — Progress Notes (Signed)
? ?[facility] [place] ? ?Patient name: [name]. MRN: [id number] DOB: 02-Jul-1955 Sex: male ? ?REASON FOR VISIT: Follow-up left upper arm AV Gore-Tex graft placement on 06/29/2021 ? ?HPI: ?Clete Kuch. is a 66 y.o. male here today for follow-up.  He has an extensive past history of access in his left arm.  He had long-term use of a left upper arm brachiocephalic fistula.  This eventually failed.  He had placement of additional first stage and then second stage basilic transposition.  He suffered extensive fat necrosis and eventually lost his fistula.  I placed a new left upper arm AV Gore-Tex graft on 06/29/2021 and he is here today for follow-up.  He continues to have a functioning right IJ tunneled catheter.  He does report some continued discomfort from his mid upper arm wound from his open wound that healed by secondary intention.  This is now completely healed.  He also reports some discomfort in his left hand.  This is particularly in his third finger.  He reports some occasional numbness as well.  He reports that this was not present prior to the graft placement. ? ?Current Outpatient Medications  ?Medication Sig Dispense Refill  ? acetaminophen (TYLENOL) 500 MG tablet Take 1,000 mg by mouth every 8 (eight) hours as needed for mild pain or headache.     ? amLODipine (NORVASC) 10 MG tablet Take 10 mg by mouth at bedtime.  1  ? aspirin 81 MG tablet Take 81 mg by mouth at bedtime.    ? brimonidine (ALPHAGAN) 0.2 % ophthalmic solution Place 1 drop into the right eye 3 (three) times daily.    ? calcium acetate (PHOSLO) 667 MG capsule Take 667-1,334 mg by mouth See admin instructions. Take 1,334 mg by mouth three times a day with meals and 667 mg with each snack  0  ? carvedilol (COREG) 12.5 MG tablet take 1 tablet by mouth twice daily.  DO NOT TAKE IN THE MORNING ON DIALYSIS DAYS (Patient taking differently: Take 12.5 mg by mouth See admin  instructions. Take 12.5 mg by mouth two times a day and DO NOT TAKE IN THE MORNING ON DIALYSIS DAYS) 60 tablet 0  ? cloNIDine (CATAPRES) 0.1 MG tablet Take 1 tablet (0.1 mg total) by mouth at bedtime. (Patient taking differently: Take 0.1 mg by mouth 2 (two) times daily.)    ? clopidogrel (PLAVIX) 75 MG tablet Take 75 mg by mouth at bedtime.    ? dorzolamide-timolol (COSOPT) 22.3-6.8 MG/ML ophthalmic solution Place 1 drop into the right eye 2 (two) times daily.    ? ergocalciferol (VITAMIN D2) 1.25 MG (50000 UT) capsule Take 50,000 Units by mouth every Monday.    ? fenofibrate 160 MG tablet Take 160 mg by mouth daily.  0  ? ferrous sulfate 325 (65 FE) MG tablet Take 325 mg by mouth daily with breakfast.    ? fluticasone (FLONASE) 50 MCG/ACT nasal spray Place 1 spray into both nostrils at bedtime as needed for allergies or rhinitis.    ? folic acid (FOLVITE) 1 MG tablet Take 1 mg by mouth at bedtime.    ? furosemide (LASIX) 20 MG tablet Take 80 mg by mouth in the morning.    ? hydrALAZINE (APRESOLINE) 25 MG tablet Take 1 tablet (25 mg total) by mouth in the morning and at bedtime.    ? insulin lispro (HUMALOG) 100 UNIT/ML KiwkPen Inject 15 Units into the skin 3 (three) times daily  before meals.    ? LANTUS SOLOSTAR 100 UNIT/ML Solostar Pen Inject 20 Units into the skin at bedtime. 15 mL 11  ? latanoprost (XALATAN) 0.005 % ophthalmic solution Place 1 drop into the right eye at bedtime.    ? oxyCODONE-acetaminophen (PERCOCET) 5-325 MG tablet Take 1 tablet by mouth every 6 (six) hours as needed for severe pain. 15 tablet 0  ? rosuvastatin (CRESTOR) 20 MG tablet Take 20 mg by mouth at bedtime.  0  ? traZODone (DESYREL) 50 MG tablet Take 50 mg by mouth at bedtime.    ? ?No current facility-administered medications for this visit.  ? ? ? ?PHYSICAL EXAM: ?Vitals:  ? 07/28/21 0803  ?BP: (!) 141/69  ?Pulse: 74  ?Resp: 14  ?Temp: 98.4 ?F (36.9 ?C)  ?TempSrc: Temporal  ?SpO2: 95%  ?Weight: 250 lb (113.4 kg)  ?Height: 6' (1.829  m)  ? ? ?GENERAL: The patient is a well-nourished male, in no acute distress. The vital signs are documented above. ?Well-healed antecubital and axillary incision.  Excellent graft thrill and bruit. ?His hand is warm.  I do not palpate a radial pulse.  He has good Doppler flow at the radial and ulnar with hand-held pencil Doppler.  This does augment with compression of his graft as expected ? ?MEDICAL ISSUES: ?Excellent healing of his left upper arm AV graft.  He is having steal symptoms in his left hand.  I long discussion regarding this.  I explained that the options really would be to tolerate his steal or unfortunately ligate his left arm AV graft and place a new access.  He has been very resistant to right arm access in the past.  Also explained that he certainly would be at risk for steal in the right arm as well.  He wishes to continue observation. ? ?He is able to use his graft at any point.  I have will notify the dialysis unit that he is okay to use his new left upper arm graft.  Assuming he has good function with this and assuming his steal is tolerable, we can remove his catheter after several successful sessions. ? ? ?Rosetta Posner, MD FACS ?[facility] [place] ?Office Tel [phone] ? ?Note: Portions of this report may have been transcribed using voice recognition software.  Every effort has been made to ensure accuracy; however, inadvertent computerized transcription errors may still be present. ?

## 2021-07-29 ENCOUNTER — Ambulatory Visit (INDEPENDENT_AMBULATORY_CARE_PROVIDER_SITE_OTHER): Payer: Medicare Other

## 2021-07-29 DIAGNOSIS — I428 Other cardiomyopathies: Secondary | ICD-10-CM | POA: Diagnosis not present

## 2021-07-29 LAB — CUP PACEART REMOTE DEVICE CHECK
Battery Remaining Longevity: 18 mo
Battery Remaining Percentage: 21 %
Brady Statistic RV Percent Paced: 0 %
Date Time Interrogation Session: 20230427031100
HighPow Impedance: 51 Ohm
Implantable Lead Implant Date: 20050810
Implantable Lead Location: 753860
Implantable Lead Model: 158
Implantable Lead Serial Number: 153491
Implantable Pulse Generator Implant Date: 20110722
Lead Channel Impedance Value: 462 Ohm
Lead Channel Pacing Threshold Amplitude: 0.7 V
Lead Channel Pacing Threshold Pulse Width: 0.4 ms
Lead Channel Setting Pacing Amplitude: 2.5 V
Lead Channel Setting Pacing Pulse Width: 0.4 ms
Lead Channel Setting Sensing Sensitivity: 0.4 mV
Pulse Gen Serial Number: 269222

## 2021-08-13 NOTE — Progress Notes (Signed)
Remote ICD transmission.   

## 2021-08-16 ENCOUNTER — Telehealth: Payer: Self-pay

## 2021-08-16 NOTE — Telephone Encounter (Signed)
Received a triage call from [name], [name]. Patient is s/p left upper arm AVGG on 06/29/21. Graft has been cannulated twice, but on third time patient screamed in pain and refuses to be stuck again.  ? ?Spoke with patient and he reports having extreme pain in left hand with numbness and cold feeling down to fingertips. Discussed scheduling a ligation of AVGG on next week or schedule an office visit on 08/18/21 at 0800 AM to discuss. Patient would like to discuss options with Dr. Donnetta Hutching first. Patient verbalized understanding to appointment date/time and will arrange transportation.  ?

## 2021-08-18 ENCOUNTER — Ambulatory Visit (INDEPENDENT_AMBULATORY_CARE_PROVIDER_SITE_OTHER): Payer: Medicare Other | Admitting: Vascular Surgery

## 2021-08-18 ENCOUNTER — Encounter: Payer: Self-pay | Admitting: Vascular Surgery

## 2021-08-18 VITALS — BP 166/75 | HR 76 | Temp 98.4°F | Ht 72.0 in | Wt 250.0 lb

## 2021-08-18 DIAGNOSIS — N186 End stage renal disease: Secondary | ICD-10-CM

## 2021-08-18 NOTE — Progress Notes (Signed)
? ?[facility] [place] ? ?Patient name: [name]. MRN: [id number] DOB: [birth year] Sex: male ? ?REASON FOR VISIT: Follow-up left arm AV access ? ?HPI: ?[name]. is a 66 y.o. male here today for continued follow-up of his left arm AV access.  He has a very complicated course.  His most recent access is a left upper arm AV Gore-Tex graft placed on 06/29/2021.  I saw him 2 weeks ago and he was having some steal symptoms in his hand.  He reports that several days ago he had some bleeding from the prior wound in his medial upper arm.  He had had a large wound from prior basilic vein transposition fistula attempt that healed by secondary intention.  There is no access under this area and he simply had separation of some scaling which caused bleeding which spontaneously stopped.  He does unfortunately continue to have some difficulty with his left hand.  He reports cool and achy sensation at times particularly in his third finger.  He actually reports that this is somewhat better on dialysis. ? ?Current Outpatient Medications  ?Medication Sig Dispense Refill  ? acetaminophen (TYLENOL) 500 MG tablet Take 1,000 mg by mouth every 8 (eight) hours as needed for mild pain or headache.     ? amLODipine (NORVASC) 10 MG tablet Take 10 mg by mouth at bedtime.  1  ? aspirin 81 MG tablet Take 81 mg by mouth at bedtime.    ? brimonidine (ALPHAGAN) 0.2 % ophthalmic solution Place 1 drop into the right eye 3 (three) times daily.    ? calcium acetate (PHOSLO) 667 MG capsule Take 667-1,334 mg by mouth See admin instructions. Take 1,334 mg by mouth three times a day with meals and 667 mg with each snack  0  ? dorzolamide-timolol (COSOPT) 22.3-6.8 MG/ML ophthalmic solution Place 1 drop into the right eye 2 (two) times daily.    ? ergocalciferol (VITAMIN D2) 1.25 MG (50000 UT) capsule Take 50,000 Units by mouth every Monday.    ? fenofibrate 160 MG tablet Take 160 mg by  mouth daily.  0  ? ferrous sulfate 325 (65 FE) MG tablet Take 325 mg by mouth daily with breakfast.    ? fluticasone (FLONASE) 50 MCG/ACT nasal spray Place 1 spray into both nostrils at bedtime as needed for allergies or rhinitis.    ? folic acid (FOLVITE) 1 MG tablet Take 1 mg by mouth at bedtime.    ? furosemide (LASIX) 20 MG tablet Take 80 mg by mouth in the morning.    ? hydrALAZINE (APRESOLINE) 25 MG tablet Take 1 tablet (25 mg total) by mouth in the morning and at bedtime.    ? insulin lispro (HUMALOG) 100 UNIT/ML KiwkPen Inject 15 Units into the skin 3 (three) times daily before meals.    ? LANTUS SOLOSTAR 100 UNIT/ML Solostar Pen Inject 20 Units into the skin at bedtime. 15 mL 11  ? oxyCODONE-acetaminophen (PERCOCET) 5-325 MG tablet Take 1 tablet by mouth every 6 (six) hours as needed for severe pain. 15 tablet 0  ? rosuvastatin (CRESTOR) 20 MG tablet Take 20 mg by mouth at bedtime.  0  ? traZODone (DESYREL) 50 MG tablet Take 50 mg by mouth at bedtime.    ? carvedilol (COREG) 12.5 MG tablet take 1 tablet by mouth twice daily.  DO NOT TAKE IN THE MORNING ON DIALYSIS DAYS (Patient taking differently: Take 12.5 mg by mouth See admin instructions. Take  12.5 mg by mouth two times a day and DO NOT TAKE IN THE MORNING ON DIALYSIS DAYS) 60 tablet 0  ? cloNIDine (CATAPRES) 0.1 MG tablet Take 1 tablet (0.1 mg total) by mouth at bedtime. (Patient taking differently: Take 0.1 mg by mouth 2 (two) times daily.)    ? clopidogrel (PLAVIX) 75 MG tablet Take 75 mg by mouth at bedtime.    ? latanoprost (XALATAN) 0.005 % ophthalmic solution Place 1 drop into the right eye at bedtime.    ? ?No current facility-administered medications for this visit.  ? ? ? ?PHYSICAL EXAM: ?Vitals:  ? 08/18/21 0810  ?BP: (!) 166/75  ?Pulse: 76  ?Temp: 98.4 ?F (36.9 ?C)  ?TempSrc: Temporal  ?SpO2: 95%  ?Weight: 250 lb (113.4 kg)  ?Height: 6' (1.829 m)  ? ? ?GENERAL: The patient is a well-nourished male, in no acute distress. The vital signs are  documented above. ?Excellent thrill in his left upper arm AV graft.  Wound healing on the medial aspect of his upper arm.  I do not palpate a radial pulse. ? ?He has barely palpable radial pulse in his right arm. ? ?MEDICAL ISSUES: ?I again discussed options with [name].  I suspect that he will eventually come to ligation of his left arm AV graft for steal.  He understands that we would then be talking about a right arm graft and he is hesitant for this.  He certainly would be at risk for steal since he has a diminished radial pulse on the right as well.  He wishes to continue observation for now.  He will call our office if he continues to have steal symptoms ? ?He knows that if he calls our office, he does not need to be seen typically by me in the office but can schedule ligation if he is unable to tolerate his left hand symptoms.  He is okay to use his graft and I have encouraged him to do this at dialysis ? ? ?Rosetta Posner, MD FACS ?[facility] [place] ?Office Tel [phone] ? ?Note: Portions of this report may have been transcribed using voice recognition software.  Every effort has been made to ensure accuracy; however, inadvertent computerized transcription errors may still be present. ?

## 2021-09-08 ENCOUNTER — Other Ambulatory Visit: Payer: Self-pay

## 2021-09-08 ENCOUNTER — Other Ambulatory Visit: Payer: Self-pay | Admitting: Physician Assistant

## 2021-09-08 ENCOUNTER — Telehealth: Payer: Self-pay

## 2021-09-08 DIAGNOSIS — N186 End stage renal disease: Secondary | ICD-10-CM

## 2021-09-08 DIAGNOSIS — T82898A Other specified complication of vascular prosthetic devices, implants and grafts, initial encounter: Secondary | ICD-10-CM

## 2021-09-08 MED ORDER — OXYCODONE HCL 5 MG PO TABS: 5.0000 mg | ORAL_TABLET | Freq: Four times a day (QID) | ORAL | 0 refills | Status: DC | PRN

## 2021-09-08 NOTE — Telephone Encounter (Signed)
Spoke with patient. Reports increased numbness and pain in left hand along with feeling cool and stiff since last office visit. Per Dr. Luther Parody last note, can schedule patient for ligation left arm AV graft if he is unable to tolerate left hand symptoms. Patient aware of earliest availability for Dr. Donnetta Hutching is July 11 and agreed to schedule in [place] on June 13 with Dr. Virl Cagey, the earliest available due to he will need to hold Plavix 5 days prior. Instructions reviewed. Patient verbalized understanding.

## 2021-09-10 ENCOUNTER — Encounter (HOSPITAL_COMMUNITY): Payer: Self-pay | Admitting: Vascular Surgery

## 2021-09-10 NOTE — Progress Notes (Signed)
Patient resides at [facility] in [place].Marland Kitchen Ph # [phone],  fax # [phone].  Internal Med - Dr [name] Cardiologist - Dr Cristopher Peru  Chest x-ray - 05/25/21 (1V) EKG - 10/27/20 Stress Test - n/a ECHO - 06/19/16 Cardiac Cath - n/a  ICD Pacemaker - [facility] [employer] Teligen 100 ICD.  Last remote check was on 07/29/21.  Rep Joey notified and suggest a Maget to be used if needed.  Sleep Study -  n/a CPAP - none  Dialysis on M-W-F.  Blood Thinner Instructions:  Per MD, stop plavix 5 days prior to procedure.  Last dose was on   Aspirin Instructions: Follow your surgeon's instructions on when to stop aspirin prior to surgery,  If no instructions were given by your surgeon then you will need to call the office for those instructions.  Anesthesia review: Yes  STOP now taking any Aspirin (unless otherwise instructed by your surgeon), Aleve, Naproxen, Ibuprofen, Motrin, Advil, Goody's, BC's, all herbal medications, fish oil, and all vitamins.   Coronavirus Screening Do you have any of the following symptoms:  Cough yes/no: No Fever (>100.69F)  yes/no: No Runny nose yes/no: No Sore throat yes/no: No Difficulty breathing/shortness of breath  yes/no: No  Have you traveled in the last 14 days and where? yes/no: No  Patient verbalized understanding of instructions that were given via phone.

## 2021-09-13 ENCOUNTER — Encounter (HOSPITAL_COMMUNITY): Payer: Self-pay | Admitting: Vascular Surgery

## 2021-09-13 NOTE — Progress Notes (Signed)
Anesthesia Chart Review:  Follows with cardiology for history of chronic systolic heart failure due to nonischemic cardiomyopathy s/p [employer] ICD, HTN, PVD s/p bilateral amputations.  Last seen by Dr. Lovena Le 04/08/2021, noted to have normal device function, class II symptoms, limited by his amputations.  No changes made to management.  Patient is on Plavix for history of TIA 2004.  ESRD on HD Monday Wednesday Friday.  He has an extensive past history of access in his left arm.  He had long-term use of a left upper arm brachiocephalic fistula.  This eventually failed.  He had placement of additional first stage and then second stage basilic transposition.  He suffered extensive fat necrosis and eventually lost his fistula.  Most recently he had a new left upper arm AV Gore-Tex graft on 06/29/2021, but has developed steal symptoms.  IDDM 2, last A1c 6.2 on 02/18/2022.  Patient will need day of surgery labs and evaluation.  EKG 10/27/20: Normal sinus rhythm.  Rate 70. Minimal voltage criteria for LVH, may be normal variant ( R in aVL ). Inferior infarct , age undetermined  TTE 06/19/2016: - Left ventricle: The cavity size was moderately dilated. There was    moderate concentric hypertrophy. Systolic function was moderately    reduced. The estimated ejection fraction was in the range of 35%    to 40%. There is akinesis of the basal-midanteroseptal and    inferoseptal myocardium. Features are consistent with a    pseudonormal left ventricular filling pattern, with concomitant    abnormal relaxation and increased filling pressure (grade 2    diastolic dysfunction).  - Ventricular septum: Septal motion showed mild paradox. These    changes are consistent with right ventricular pacing.  - Mitral valve: Calcified annulus. Mildly thickened leaflets .  - Left atrium: The atrium was mildly dilated. Anterior-posterior    dimension: 43 mm.  - Right ventricle: Pacer wire or catheter noted in right  ventricle.     Wynonia Musty [facility] Short Stay [facility]/Anesthesiology Phone [phone] 09/13/2021 12:35 PM

## 2021-09-13 NOTE — Progress Notes (Signed)
PCP - Dr [name] Cardiologist/EP - Dr Crissie Sickles  Chest x-ray - 05/25/21 (1V) EKG - 10/27/20 Stress Test - n/a ECHO - 06/19/16 Cardiac Cath - [place] notified [phone]) of surgery date and time.  Use magnet but call Joey if you need him to come to hospital.  Last remote device check was on 07/29/21.   Perioperative Device Orders initiated.  Sleep Study -  n/a CPAP - none  Blood Thinner Instructions:  Hold Plavix 5 days prior to procedure.  Last dose was on 09/07/21.  Aspirin Instructions: Continue ASA  Diabetes THE NIGHT BEFORE SURGERY, take 25 units Lantus Insulin.       THE MORNING OF SURGERY, do not take Humalog Insulin.    Patient does not check his blood sugar.  Anesthesia review: Yes  STOP now taking any Aspirin (unless otherwise instructed by your surgeon), Aleve, Naproxen, Ibuprofen, Motrin, Advil, Goody's, BC's, all herbal medications, fish oil, and all vitamins.   Coronavirus Screening Does the patient have any of the following symptoms:  Cough yes/no: No Fever (>100.74F)  yes/no: No Runny nose yes/no: No Sore throat yes/no: No Difficulty breathing/shortness of breath  yes/no: No  Have you traveled in the last 14 days and where? yes/no: No  Patient verbalized understanding of instructions that were given via phone.

## 2021-09-13 NOTE — Progress Notes (Signed)
Surgical Instructions    Your procedure is scheduled on Tuesday, 09/14/21.Marland Kitchen  Report to [facility] Main Entrance "A" at 6:50 A.M., then check in with the Admitting office.  Call this number if you have problems the morning of surgery:  [phone]   If you have any questions prior to your surgery date call [phone]: Open Monday-Friday 8am-4pm    Remember:  Do not eat or after midnight tonight (Monday).      Take these medicines the morning of surgery with A SIP OF WATER: acetaminophen (TYLENOL) PRN All Eye Drops carvedilol (COREG)  cloNIDine (CATAPRES)  fenofibrate  fluticasone (FLONASE)  hydrALAZINE  oxyCODONE-acetaminophen(PERCOCET) PRN    As of today, STOP taking any Aspirin (unless otherwise instructed by your surgeon) Aleve, Naproxen, Ibuprofen, Motrin, Advil, Goody's, BC's, all herbal medications, fish oil, and all vitamins.           Do not wear jewelry  Do not wear lotions, powders, colognes, or deodorant. Men may shave face and neck. Do not bring valuables to the hospital.   [facility] is not responsible for any belongings or valuables. .   Do NOT Smoke (Tobacco/Vaping)  24 hours prior to your procedure  If you use a CPAP at night, you may bring your mask for your overnight stay.   Contacts, glasses, hearing aids, dentures or partials may not be worn into surgery, please bring cases for these belongings   For patients admitted to the hospital, discharge time will be determined by your treatment team.   Patients discharged the day of surgery will not be allowed to drive home, and someone needs to stay with them for 24 hours.   SURGICAL WAITING ROOM VISITATION Patients having surgery or a procedure in a hospital may have two support people. Children under the age of 18 must have an adult with them who is not the patient. They may stay in the waiting area during the procedure and may switch out with other visitors. If the patient needs to stay at the  hospital during part of their recovery, the visitor guidelines for inpatient rooms apply.  Please refer to the [facility] website for the visitor guidelines for Inpatients (after your surgery is over and you are in a regular room).     Special instructions:    Oral Hygiene is also important to reduce your risk of infection.  Remember - BRUSH YOUR TEETH THE MORNING OF SURGERY WITH YOUR REGULAR TOOTHPASTE.

## 2021-09-13 NOTE — Anesthesia Preprocedure Evaluation (Addendum)
Anesthesia Evaluation  Patient identified by MRN, date of birth, ID band Patient awake    Reviewed: Allergy & Precautions, NPO status , Patient's Chart, lab work & pertinent test results  Airway Mallampati: II  TM Distance: >3 FB Neck ROM: Full    Dental no notable dental hx.    Pulmonary neg pulmonary ROS, former smoker,    Pulmonary exam normal        Cardiovascular hypertension, Pt. on medications and Pt. on home beta blockers + Peripheral Vascular Disease and +CHF  + Cardiac Defibrillator  Rhythm:Regular Rate:Normal     Neuro/Psych CVA negative psych ROS   GI/Hepatic negative GI ROS, Neg liver ROS,   Endo/Other  diabetes, Poorly Controlled, Type 2, Insulin Dependent  Renal/GU ESRF and DialysisRenal disease  negative genitourinary   Musculoskeletal  (+) Arthritis , Osteoarthritis,    Abdominal Normal abdominal exam  (+)   Peds  Hematology  (+) Blood dyscrasia, anemia ,   Anesthesia Other Findings   Reproductive/Obstetrics                           Anesthesia Physical Anesthesia Plan  ASA: 4  Anesthesia Plan: MAC and Regional   Post-op Pain Management: Regional block*   Induction: Intravenous  PONV Risk Score and Plan: 1 and Ondansetron, Dexamethasone, Propofol infusion and Treatment may vary due to age or medical condition  Airway Management Planned: Simple Face Mask, Natural Airway and Nasal Cannula  Additional Equipment: None  Intra-op Plan:   Post-operative Plan:   Informed Consent: I have reviewed the patients History and Physical, chart, labs and discussed the procedure including the risks, benefits and alternatives for the proposed anesthesia with the patient or authorized representative who has indicated his/her understanding and acceptance.     Dental advisory given  Plan Discussed with: CRNA  Anesthesia Plan Comments: (PAT note by Karoline Caldwell, PA-C: Follows  with cardiology for history of chronic systolic heart failure due to nonischemic cardiomyopathy s/p [employer] ICD, HTN, PVD s/p bilateral amputations.  Last seen by Dr. Lovena Le 04/08/2021, noted to have normal device function, class II symptoms, limited by his amputations.  No changes made to management.  Patient is on Plavix for history of TIA 2004.  ESRD on HD Monday Wednesday Friday.  He has an extensive past history of access in his left arm. He had long-term use of a left upper arm brachiocephalic fistula. This eventually failed. He had placement of additional first stage and then second stage basilic transposition. He suffered extensive fat necrosis and eventually lost his fistula. Most recently he had a new left upper arm AV Gore-Tex graft on 06/29/2021, but has developed steal symptoms.  IDDM 2, last A1c 6.2 on 02/18/2022.  Patient will need day of surgery labs and evaluation.  EKG 10/27/20: Normal sinus rhythm.  Rate 70. Minimal voltage criteria for LVH, may be normal variant ( R in aVL ). Inferior infarct , age undetermined  TTE 06/19/2016: - Left ventricle: The cavity size was moderately dilated. There was  moderate concentric hypertrophy. Systolic function was moderately  reduced. The estimated ejection fraction was in the range of 35%  to 40%. There is akinesis of the basal-midanteroseptal and  inferoseptal myocardium. Features are consistent with a  pseudonormal left ventricular filling pattern, with concomitant  abnormal relaxation and increased filling pressure (grade 2  diastolic dysfunction).  - Ventricular septum: Septal motion showed mild paradox. These  changes are consistent with right  ventricular pacing.  - Mitral valve: Calcified annulus. Mildly thickened leaflets .  - Left atrium: The atrium was mildly dilated. Anterior-posterior  dimension: 43 mm.  - Right ventricle: Pacer wire or catheter noted in right ventricle.   )       Anesthesia Quick Evaluation

## 2021-09-14 ENCOUNTER — Other Ambulatory Visit: Payer: Self-pay

## 2021-09-14 ENCOUNTER — Ambulatory Visit (HOSPITAL_COMMUNITY)
Admission: RE | Admit: 2021-09-14 | Discharge: 2021-09-14 | Disposition: A | Discharge status: Home / Self Care | Payer: Medicare Other | Attending: Vascular Surgery | Admitting: Vascular Surgery

## 2021-09-14 ENCOUNTER — Encounter: Payer: Self-pay | Admitting: Internal Medicine

## 2021-09-14 ENCOUNTER — Encounter (HOSPITAL_COMMUNITY): Payer: Self-pay | Admitting: Vascular Surgery

## 2021-09-14 ENCOUNTER — Ambulatory Visit (HOSPITAL_COMMUNITY): Payer: Medicare Other | Admitting: Physician Assistant

## 2021-09-14 ENCOUNTER — Encounter (HOSPITAL_COMMUNITY)
Admission: RE | Disposition: A | Discharge status: Home / Self Care | Payer: Self-pay | Source: Home / Self Care | Attending: Vascular Surgery

## 2021-09-14 ENCOUNTER — Ambulatory Visit (HOSPITAL_BASED_OUTPATIENT_CLINIC_OR_DEPARTMENT_OTHER): Payer: Medicare Other | Admitting: Physician Assistant

## 2021-09-14 DIAGNOSIS — Y841 Kidney dialysis as the cause of abnormal reaction of the patient, or of later complication, without mention of misadventure at the time of the procedure: Secondary | ICD-10-CM | POA: Insufficient documentation

## 2021-09-14 DIAGNOSIS — N186 End stage renal disease: Secondary | ICD-10-CM

## 2021-09-14 DIAGNOSIS — T82898A Other specified complication of vascular prosthetic devices, implants and grafts, initial encounter: Secondary | ICD-10-CM | POA: Diagnosis present

## 2021-09-14 DIAGNOSIS — Z87891 Personal history of nicotine dependence: Secondary | ICD-10-CM | POA: Diagnosis not present

## 2021-09-14 DIAGNOSIS — I132 Hypertensive heart and chronic kidney disease with heart failure and with stage 5 chronic kidney disease, or end stage renal disease: Secondary | ICD-10-CM | POA: Diagnosis not present

## 2021-09-14 DIAGNOSIS — I509 Heart failure, unspecified: Secondary | ICD-10-CM

## 2021-09-14 DIAGNOSIS — Z794 Long term (current) use of insulin: Secondary | ICD-10-CM | POA: Diagnosis not present

## 2021-09-14 DIAGNOSIS — Z992 Dependence on renal dialysis: Secondary | ICD-10-CM

## 2021-09-14 DIAGNOSIS — E1122 Type 2 diabetes mellitus with diabetic chronic kidney disease: Secondary | ICD-10-CM | POA: Insufficient documentation

## 2021-09-14 HISTORY — PX: LIGATION ARTERIOVENOUS GORTEX GRAFT: SHX5947

## 2021-09-14 LAB — POCT I-STAT, CHEM 8
BUN: 64 mg/dL — ABNORMAL HIGH (ref 8–23)
Calcium, Ion: 1.08 mmol/L — ABNORMAL LOW (ref 1.15–1.40)
Chloride: 105 mmol/L (ref 98–111)
Creatinine, Ser: 9.1 mg/dL — ABNORMAL HIGH (ref 0.61–1.24)
Glucose, Bld: 122 mg/dL — ABNORMAL HIGH (ref 70–99)
HCT: 35 % — ABNORMAL LOW (ref 39.0–52.0)
Hemoglobin: 11.9 g/dL — ABNORMAL LOW (ref 13.0–17.0)
Potassium: 5.6 mmol/L — ABNORMAL HIGH (ref 3.5–5.1)
Sodium: 140 mmol/L (ref 135–145)
TCO2: 27 mmol/L (ref 22–32)

## 2021-09-14 LAB — GLUCOSE, CAPILLARY
Glucose-Capillary: 110 mg/dL — ABNORMAL HIGH (ref 70–99)
Glucose-Capillary: 119 mg/dL — ABNORMAL HIGH (ref 70–99)

## 2021-09-14 SURGERY — LIGATION ARTERIOVENOUS GORTEX GRAFT
Anesthesia: Monitor Anesthesia Care | Laterality: Left

## 2021-09-14 MED ORDER — SODIUM CHLORIDE 0.9 % IV SOLN: INTRAVENOUS | Status: DC

## 2021-09-14 MED ORDER — OXYCODONE-ACETAMINOPHEN 5-325 MG PO TABS: 1.0000 | ORAL_TABLET | Freq: Four times a day (QID) | ORAL | 0 refills | Status: DC | PRN

## 2021-09-14 MED ORDER — FENTANYL CITRATE (PF) 100 MCG/2ML IJ SOLN
INTRAMUSCULAR | Status: AC
  Administered 2021-09-14: 50 ug via INTRAVENOUS
  Filled 2021-09-14: qty 2

## 2021-09-14 MED ORDER — CEFAZOLIN SODIUM-DEXTROSE 2-4 GM/100ML-% IV SOLN
2.0000 g | INTRAVENOUS | Status: AC
  Administered 2021-09-14: 2 g via INTRAVENOUS
  Filled 2021-09-14: qty 100

## 2021-09-14 MED ORDER — MEPIVACAINE HCL (PF) 2 % IJ SOLN
INTRAMUSCULAR | Status: DC | PRN
  Administered 2021-09-14: 20 mL

## 2021-09-14 MED ORDER — BUPIVACAINE-EPINEPHRINE (PF) 0.5% -1:200000 IJ SOLN
INTRAMUSCULAR | Status: DC | PRN
  Administered 2021-09-14: 10 mL

## 2021-09-14 MED ORDER — FENTANYL CITRATE (PF) 100 MCG/2ML IJ SOLN: 25.0000 ug | INTRAMUSCULAR | Status: DC | PRN

## 2021-09-14 MED ORDER — LACTATED RINGERS IV SOLN: INTRAVENOUS | Status: DC

## 2021-09-14 MED ORDER — 0.9 % SODIUM CHLORIDE (POUR BTL) OPTIME
TOPICAL | Status: DC | PRN
  Administered 2021-09-14: 1000 mL

## 2021-09-14 MED ORDER — INSULIN ASPART 100 UNIT/ML IJ SOLN: 0.0000 [IU] | INTRAMUSCULAR | Status: DC | PRN

## 2021-09-14 MED ORDER — CHLORHEXIDINE GLUCONATE 4 % EX LIQD: 60.0000 mL | Freq: Once | CUTANEOUS | Status: DC

## 2021-09-14 MED ORDER — HEPARIN 6000 UNIT IRRIGATION SOLUTION
Status: DC | PRN
  Administered 2021-09-14: 1

## 2021-09-14 MED ORDER — MIDAZOLAM HCL 2 MG/2ML IJ SOLN
INTRAMUSCULAR | Status: AC
  Administered 2021-09-14: 2 mg via INTRAVENOUS
  Filled 2021-09-14: qty 2

## 2021-09-14 MED ORDER — MIDAZOLAM HCL 2 MG/2ML IJ SOLN: 2.0000 mg | Freq: Once | INTRAMUSCULAR | Status: AC

## 2021-09-14 MED ORDER — HEMOSTATIC AGENTS (NO CHARGE) OPTIME
TOPICAL | Status: DC | PRN
  Administered 2021-09-14: 1 via TOPICAL

## 2021-09-14 MED ORDER — PROPOFOL 500 MG/50ML IV EMUL
INTRAVENOUS | Status: DC | PRN
  Administered 2021-09-14: 20 ug/kg/min via INTRAVENOUS

## 2021-09-14 MED ORDER — FENTANYL CITRATE (PF) 100 MCG/2ML IJ SOLN: 100.0000 ug | Freq: Once | INTRAMUSCULAR | Status: AC

## 2021-09-14 MED ORDER — LIDOCAINE 2% (20 MG/ML) 5 ML SYRINGE
INTRAMUSCULAR | Status: DC | PRN
  Administered 2021-09-14: 80 mg via INTRAVENOUS

## 2021-09-14 MED ORDER — DEXAMETHASONE SODIUM PHOSPHATE 10 MG/ML IJ SOLN
INTRAMUSCULAR | Status: DC | PRN
  Administered 2021-09-14: 4 mg via INTRAVENOUS

## 2021-09-14 MED ORDER — ORAL CARE MOUTH RINSE: 15.0000 mL | Freq: Once | OROMUCOSAL | Status: AC

## 2021-09-14 MED ORDER — ONDANSETRON HCL 4 MG/2ML IJ SOLN
INTRAMUSCULAR | Status: DC | PRN
  Administered 2021-09-14: 4 mg via INTRAVENOUS

## 2021-09-14 MED ORDER — CHLORHEXIDINE GLUCONATE 0.12 % MT SOLN
15.0000 mL | Freq: Once | OROMUCOSAL | Status: AC
  Administered 2021-09-14: 15 mL via OROMUCOSAL
  Filled 2021-09-14: qty 15

## 2021-09-14 MED ORDER — ACETAMINOPHEN 10 MG/ML IV SOLN: 1000.0000 mg | Freq: Once | INTRAVENOUS | Status: DC | PRN

## 2021-09-14 MED ORDER — FENTANYL CITRATE (PF) 100 MCG/2ML IJ SOLN: 50.0000 ug | Freq: Once | INTRAMUSCULAR | Status: DC

## 2021-09-14 SURGICAL SUPPLY — 39 items
ADH SKN CLS APL DERMABOND .7 (GAUZE/BANDAGES/DRESSINGS) ×1
BAG COUNTER SPONGE SURGICOUNT (BAG) ×2 IMPLANT
BAG SPNG CNTER NS LX DISP (BAG) ×1
CANISTER SUCT 3000ML PPV (MISCELLANEOUS) ×2 IMPLANT
CLIP TI MEDIUM 6 (CLIP) ×1 IMPLANT
CLIP TI WIDE RED SMALL 6 (CLIP) ×1 IMPLANT
COVER PROBE W GEL 5X96 (DRAPES) ×2 IMPLANT
DERMABOND ADVANCED (GAUZE/BANDAGES/DRESSINGS) ×1
DERMABOND ADVANCED .7 DNX12 (GAUZE/BANDAGES/DRESSINGS) ×1 IMPLANT
ELECT REM PT RETURN 9FT ADLT (ELECTROSURGICAL) ×2
ELECTRODE REM PT RTRN 9FT ADLT (ELECTROSURGICAL) ×1 IMPLANT
GLOVE BIO SURGEON STRL SZ7.5 (GLOVE) ×2 IMPLANT
GLOVE BIOGEL PI IND STRL 8 (GLOVE) ×1 IMPLANT
GLOVE BIOGEL PI INDICATOR 8 (GLOVE) ×1
GLOVE SRG 8 PF TXTR STRL LF DI (GLOVE) ×1 IMPLANT
GLOVE SURG POLYISO LF SZ8 (GLOVE) IMPLANT
GLOVE SURG UNDER POLY LF SZ8 (GLOVE) ×2
GOWN STRL REUS W/ TWL LRG LVL3 (GOWN DISPOSABLE) ×2 IMPLANT
GOWN STRL REUS W/TWL 2XL LVL3 (GOWN DISPOSABLE) ×2 IMPLANT
GOWN STRL REUS W/TWL LRG LVL3 (GOWN DISPOSABLE) ×4
HEMOSTAT SNOW SURGICEL 2X4 (HEMOSTASIS) ×1 IMPLANT
KIT BASIN OR (CUSTOM PROCEDURE TRAY) ×2 IMPLANT
KIT TURNOVER KIT B (KITS) ×2 IMPLANT
NS IRRIG 1000ML POUR BTL (IV SOLUTION) ×2 IMPLANT
PACK CV ACCESS (CUSTOM PROCEDURE TRAY) ×2 IMPLANT
PAD ARMBOARD 7.5X6 YLW CONV (MISCELLANEOUS) ×4 IMPLANT
SLING ARM FOAM STRAP LRG (SOFTGOODS) IMPLANT
SLING ARM FOAM STRAP MED (SOFTGOODS) IMPLANT
SPONGE T-LAP 18X18 ~~LOC~~+RFID (SPONGE) ×1 IMPLANT
SUT MNCRL AB 4-0 PS2 18 (SUTURE) ×2 IMPLANT
SUT PROLENE 5 0 C 1 24 (SUTURE) ×3 IMPLANT
SUT PROLENE 6 0 BV (SUTURE) IMPLANT
SUT SILK 0 TIES 10X30 (SUTURE) ×2 IMPLANT
SUT SILK 3 0 SH CR/8 (SUTURE) ×2 IMPLANT
SUT VIC AB 3-0 SH 27 (SUTURE) ×2
SUT VIC AB 3-0 SH 27X BRD (SUTURE) ×1 IMPLANT
TOWEL GREEN STERILE (TOWEL DISPOSABLE) ×2 IMPLANT
UNDERPAD 30X36 HEAVY ABSORB (UNDERPADS AND DIAPERS) ×2 IMPLANT
WATER STERILE IRR 1000ML POUR (IV SOLUTION) ×2 IMPLANT

## 2021-09-14 NOTE — H&P (Signed)
[facility] [place]   Patient name: [name].        MRN: [id number]        DOB: May 09, 1955          Sex: male   Patient seen and examined in preop holding.  No complaints. No changes to medication history or physical exam since last seen in clinic. After discussing the risks and benefits of left arm fistula ligation, Godfrey Pick. elected to proceed.   Broadus John MD    REASON FOR VISIT: Follow-up left upper arm AV Gore-Tex graft placement on 06/29/2021   HPI: [name]. is a 66 y.o. male here today for follow-up.  He has an extensive past history of access in his left arm.  He had long-term use of a left upper arm brachiocephalic fistula.  This eventually failed.  He had placement of additional first stage and then second stage basilic transposition.  He suffered extensive fat necrosis and eventually lost his fistula.  I placed a new left upper arm AV Gore-Tex graft on 06/29/2021 and he is here today for follow-up.  He continues to have a functioning right IJ tunneled catheter.  He does report some continued discomfort from his mid upper arm wound from his open wound that healed by secondary intention.  This is now completely healed.  He also reports some discomfort in his left hand.  This is particularly in his third finger.  He reports some occasional numbness as well.  He reports that this was not present prior to the graft placement.         Current Outpatient Medications  Medication Sig Dispense Refill   acetaminophen (TYLENOL) 500 MG tablet Take 1,000 mg by mouth every 8 (eight) hours as needed for mild pain or headache.        amLODipine (NORVASC) 10 MG tablet Take 10 mg by mouth at bedtime.   1   aspirin 81 MG tablet Take 81 mg by mouth at bedtime.       brimonidine (ALPHAGAN) 0.2 % ophthalmic solution Place 1 drop into the right eye 3 (three) times daily.       calcium acetate (PHOSLO) 667 MG capsule Take 667-1,334 mg by mouth See admin  instructions. Take 1,334 mg by mouth three times a day with meals and 667 mg with each snack   0   carvedilol (COREG) 12.5 MG tablet take 1 tablet by mouth twice daily.  DO NOT TAKE IN THE MORNING ON DIALYSIS DAYS (Patient taking differently: Take 12.5 mg by mouth See admin instructions. Take 12.5 mg by mouth two times a day and DO NOT TAKE IN THE MORNING ON DIALYSIS DAYS) 60 tablet 0   cloNIDine (CATAPRES) 0.1 MG tablet Take 1 tablet (0.1 mg total) by mouth at bedtime. (Patient taking differently: Take 0.1 mg by mouth 2 (two) times daily.)       clopidogrel (PLAVIX) 75 MG tablet Take 75 mg by mouth at bedtime.       dorzolamide-timolol (COSOPT) 22.3-6.8 MG/ML ophthalmic solution Place 1 drop into the right eye 2 (two) times daily.       ergocalciferol (VITAMIN D2) 1.25 MG (50000 UT) capsule Take 50,000 Units by mouth every Monday.       fenofibrate 160 MG tablet Take 160 mg by mouth daily.   0   ferrous sulfate 325 (65 FE) MG tablet Take 325 mg by mouth daily with breakfast.  fluticasone (FLONASE) 50 MCG/ACT nasal spray Place 1 spray into both nostrils at bedtime as needed for allergies or rhinitis.       folic acid (FOLVITE) 1 MG tablet Take 1 mg by mouth at bedtime.       furosemide (LASIX) 20 MG tablet Take 80 mg by mouth in the morning.       hydrALAZINE (APRESOLINE) 25 MG tablet Take 1 tablet (25 mg total) by mouth in the morning and at bedtime.       insulin lispro (HUMALOG) 100 UNIT/ML KiwkPen Inject 15 Units into the skin 3 (three) times daily before meals.       LANTUS SOLOSTAR 100 UNIT/ML Solostar Pen Inject 20 Units into the skin at bedtime. 15 mL 11   latanoprost (XALATAN) 0.005 % ophthalmic solution Place 1 drop into the right eye at bedtime.       oxyCODONE-acetaminophen (PERCOCET) 5-325 MG tablet Take 1 tablet by mouth every 6 (six) hours as needed for severe pain. 15 tablet 0   rosuvastatin (CRESTOR) 20 MG tablet Take 20 mg by mouth at bedtime.   0   traZODone (DESYREL) 50 MG  tablet Take 50 mg by mouth at bedtime.        No current facility-administered medications for this visit.        PHYSICAL EXAM:    Vitals:    07/28/21 0803  BP: (!) 141/69  Pulse: 74  Resp: 14  Temp: 98.4 F (36.9 C)  TempSrc: Temporal  SpO2: 95%  Weight: 250 lb (113.4 kg)  Height: 6' (1.829 m)      GENERAL: The patient is a well-nourished male, in no acute distress. The vital signs are documented above. Well-healed antecubital and axillary incision.  Excellent graft thrill and bruit. His hand is warm.  I do not palpate a radial pulse.  He has good Doppler flow at the radial and ulnar with hand-held pencil Doppler.  This does augment with compression of his graft as expected   MEDICAL ISSUES: Excellent healing of his left upper arm AV graft.  He is having steal symptoms in his left hand.  I long discussion regarding this.  I explained that the options really would be to tolerate his steal or unfortunately ligate his left arm AV graft and place a new access.  He has been very resistant to right arm access in the past.  Also explained that he certainly would be at risk for steal in the right arm as well.  He wishes to continue observation.   He is able to use his graft at any point.  I have will notify the dialysis unit that he is okay to use his new left upper arm graft.  Assuming he has good function with this and assuming his steal is tolerable, we can remove his catheter after several successful sessions.     Rosetta Posner, MD FACS [facility] Tel [phone]

## 2021-09-14 NOTE — Progress Notes (Signed)
[facility] PROGRAMMING  Patient Information: Name:  [name].  DOB:  [birth year]  MRN:  [id number]    Planned Procedure:  Ligation of Left Arm Arteriovenous Graft  Surgeon:  Dr Orlie Pollen  Date of Procedure:  09/14/21  Cautery will be used.  Position during surgery:  supine   Please send documentation back to:  [facility] (Fax # [phone])  Device Information:  Clinic EP Physician:  Cristopher Peru, MD   Device Type:  Defibrillator Manufacturer and Phone #:  [employer]: [phone] Pacemaker Dependent?:  No. Date of Last Device Check:  07/29/21 Remote Normal Device Function?:  Yes.    Electrophysiologist's Recommendations:  Have magnet available. Provide continuous ECG monitoring when magnet is used or reprogramming is to be performed.  Procedure may interfere with device function.  Magnet should be placed over device during procedure.  Per Device Clinic Standing Orders, Wanda Plump, RN  8:00 AM 09/14/2021

## 2021-09-14 NOTE — Transfer of Care (Signed)
Immediate Anesthesia Transfer of Care Note  Patient: [name].  Procedure(s) Performed: LIGATION OF LEFT ARM ARTERIOVENOUS GORTEX GRAFT (Left)  Patient Location: PACU  Anesthesia Type:MAC and Regional  Level of Consciousness: patient cooperative and responds to stimulation  Airway & Oxygen Therapy: Patient Spontanous Breathing and Patient connected to nasal cannula oxygen  Post-op Assessment: Report given to RN and Post -op Vital signs reviewed and stable  Post vital signs: Reviewed and stable  Last Vitals:  Vitals Value Taken Time  BP 128/72 09/14/21 1117  Temp    Pulse 71 09/14/21 1119  Resp 14 09/14/21 1119  SpO2 93 % 09/14/21 1119  Vitals shown include unvalidated device data.  Last Pain:  Vitals:   09/14/21 0945  TempSrc:   PainSc: 1       Patients Stated Pain Goal: 2 (31/51/76 1607)  Complications: No notable events documented.

## 2021-09-14 NOTE — Anesthesia Procedure Notes (Signed)
Anesthesia Regional Block: Supraclavicular block   Pre-Anesthetic Checklist: , timeout performed,  Correct Patient, Correct Site, Correct Laterality,  Correct Procedure, Correct Position, site marked,  Risks and benefits discussed,  Surgical consent,  Pre-op evaluation,  At surgeon's request and post-op pain management  Laterality: Left  Prep: Dura Prep       Needles:  Injection technique: Single-shot  Needle Type: Echogenic Stimulator Needle     Needle Length: 5cm  Needle Gauge: 20     Additional Needles:   Procedures:,,,, ultrasound used (permanent image in chart),,    Narrative:  Start time: 09/14/2021 9:27 AM End time: 09/14/2021 9:30 AM Injection made incrementally with aspirations every 5 mL.  Performed by: Personally  Anesthesiologist: Darral Dash, DO  Additional Notes: Patient identified. Risks/Benefits/Options discussed with patient including but not limited to bleeding, infection, nerve damage, failed block, incomplete pain control. Patient expressed understanding and wished to proceed. All questions were answered. Sterile technique was used throughout the entire procedure. Please see nursing notes for vital signs. Aspirated in 5cc intervals with injection for negative confirmation. Patient was given instructions on fall risk and not to get out of bed. All questions and concerns addressed with instructions to call with any issues or inadequate analgesia.

## 2021-09-14 NOTE — Op Note (Signed)
    NAME: [name].    MRN: [id number] DOB: May 04, 1955    DATE OF OPERATION: 09/14/2021  PREOP DIAGNOSIS:    Left upper extremity steal syndrome from arteriovenous graft  POSTOP DIAGNOSIS:    Same  PROCEDURE:    Left arm AV graft ligation  SURGEON: Broadus John  ASSIST: Paulo Fruit, PA  ANESTHESIA: Moderate, graft  EBL: 10 mL  INDICATIONS:    [name] Billy. is a 66 y.o. male with end-stage renal disease status post left upper extremity arteriovenous graft creation several months ago.  He has had waxing waning numbness and paresthesias in the left upper extremity.  On physical exam, he had a nonpalpable left radial pulse.  [name] was diagnosed with steal syndrome.  He had no wounds, no rest pain, but asked to have his AV graft ligated due to the paresthesias. After discussing the risks and benefits of left upper extremity AV graft ligation for steal syndrome, [name] elected to proceed.  FINDINGS:   Left upper extremity 4 x 7 mm tapered PTFE graft ligated  TECHNIQUE:   Patient was brought to the OR laid in supine position.  General anesthesia was induced and the patient was prepped and draped in standard fashion.  A timeout was performed.  The case began with a longitudinal incision along the left upper arm AV graft.  The graft was exposed in standard fashion and controlled with a vessel loop.  Next to vascular clamps were brought onto the field and the AV graft was controlled both proximally and distally.  The graft was severed and both proximal and distal aspects were double suture ligated using 5-0 Prolene suture.  The wound bed was irrigated with saline and closed using Vicryl suture with Monocryl and Dermabond at the skin.  Macie Burows, MD [facility]  DATE OF DICTATION:   09/14/2021

## 2021-09-15 ENCOUNTER — Encounter (HOSPITAL_COMMUNITY): Payer: Self-pay | Admitting: Vascular Surgery

## 2021-09-15 NOTE — Anesthesia Postprocedure Evaluation (Signed)
Anesthesia Post Note  Patient: [name].  Procedure(s) Performed: LIGATION OF LEFT ARM ARTERIOVENOUS GORTEX GRAFT (Left)     Patient location during evaluation: PACU Anesthesia Type: Regional and MAC Level of consciousness: awake and alert Pain management: pain level controlled Vital Signs Assessment: post-procedure vital signs reviewed and stable Respiratory status: spontaneous breathing, nonlabored ventilation, respiratory function stable and patient connected to nasal cannula oxygen Cardiovascular status: stable and blood pressure returned to baseline Postop Assessment: no apparent nausea or vomiting Anesthetic complications: no   No notable events documented.  Last Vitals:  Vitals:   09/14/21 1132 09/14/21 1147  BP: (!) 168/76 (!) 157/80  Pulse: 70 69  Resp: 16 16  Temp:  36.5 C  SpO2: 97% 96%    Last Pain:  Vitals:   09/14/21 1147  TempSrc:   PainSc: 0-No pain                 Belenda Cruise P Misty Foutz

## 2021-09-22 ENCOUNTER — Other Ambulatory Visit: Payer: Self-pay

## 2021-09-22 ENCOUNTER — Telehealth: Payer: Self-pay

## 2021-09-22 DIAGNOSIS — N186 End stage renal disease: Secondary | ICD-10-CM

## 2021-09-22 NOTE — Telephone Encounter (Signed)
[name] at [facility] called while pt in dialysis stating that he was having swelling on his L arm, sores on his fingers, and reports cool sensation. Pt had a ligation to his L AVG on 6/13.  Spoke with Dr Scot Dock who recommended an UE venous to r/o DVT and based on results, f/u with an UE duplex if needed. Pt scheduled, Hoyle Sauer called to inform pt while still in dialysis.

## 2021-09-23 ENCOUNTER — Ambulatory Visit (HOSPITAL_COMMUNITY)
Admission: RE | Admit: 2021-09-23 | Discharge: 2021-09-23 | Disposition: A | Discharge status: Home / Self Care | Payer: Medicare Other | Source: Ambulatory Visit | Attending: Surgery | Admitting: Surgery

## 2021-09-23 DIAGNOSIS — N186 End stage renal disease: Secondary | ICD-10-CM

## 2021-09-29 ENCOUNTER — Encounter: Payer: Self-pay | Admitting: Vascular Surgery

## 2021-09-29 ENCOUNTER — Ambulatory Visit (INDEPENDENT_AMBULATORY_CARE_PROVIDER_SITE_OTHER): Payer: Medicare Other | Admitting: Vascular Surgery

## 2021-09-29 VITALS — BP 171/76 | HR 67 | Temp 98.1°F | Resp 14 | Ht 72.0 in | Wt 260.0 lb

## 2021-09-29 DIAGNOSIS — T82898A Other specified complication of vascular prosthetic devices, implants and grafts, initial encounter: Secondary | ICD-10-CM

## 2021-09-29 DIAGNOSIS — N186 End stage renal disease: Secondary | ICD-10-CM

## 2021-09-29 NOTE — Progress Notes (Signed)
[facility] [place]  Patient name: [name]. MRN: [id number] DOB: 1955-07-07 Sex: male  REASON FOR VISIT: Follow-up left arm steal syndrome with recent ligation of his left upper arm AV graft  HPI: Pierson Vantol. is a 66 y.o. male here today for follow-up.  He has had a very difficult time with left arm access.  He had long successful use of a left brachiocephalic fistula which eventually failed.  He had attempted two-stage basilic vein transposition.  He had complication of the second procedure with extensive tissue loss over the incision and eventually had healing of this.  He then had thrombosis of his fistula and never was able to use this.  I placed a left upper arm AV graft and he had good healing of this.  Unfortunately he had a progressive steal and was unable to tolerate this and was taken to the operating room by Dr. Virl Cagey o 09/14/2021 for ligation.  He called our office reporting ongoing swelling and discomfort.  He underwent duplex showing no evidence of DVT on 622 and normal flow through the brachial, ulnar and radial arteries. Current Outpatient Medications  Medication Sig Dispense Refill   acetaminophen (TYLENOL) 500 MG tablet Take 1,000 mg by mouth every 8 (eight) hours as needed for mild pain or headache.      amLODipine (NORVASC) 10 MG tablet Take 10 mg by mouth at bedtime.  1   aspirin 81 MG tablet Take 81 mg by mouth at bedtime.     brimonidine (ALPHAGAN) 0.2 % ophthalmic solution Place 1 drop into the right eye 3 (three) times daily.     calcium acetate (PHOSLO) 667 MG capsule Take 667-1,334 mg by mouth See admin instructions. Take 1,334 mg by mouth three times a day with meals and 667 mg with each snack  0   carvedilol (COREG) 12.5 MG tablet take 1 tablet by mouth twice daily.  DO NOT TAKE IN THE MORNING ON DIALYSIS DAYS (Patient taking differently: Take 12.5 mg by mouth See admin instructions. Take 12.5 mg by  mouth two times a day and DO NOT TAKE IN THE MORNING ON DIALYSIS DAYS) 60 tablet 0   cloNIDine (CATAPRES) 0.1 MG tablet Take 1 tablet (0.1 mg total) by mouth at bedtime. (Patient taking differently: Take 0.1 mg by mouth 2 (two) times daily.)     clopidogrel (PLAVIX) 75 MG tablet Take 75 mg by mouth at bedtime.     dorzolamide-timolol (COSOPT) 22.3-6.8 MG/ML ophthalmic solution Place 1 drop into the right eye 2 (two) times daily.     ergocalciferol (VITAMIN D2) 1.25 MG (50000 UT) capsule Take 50,000 Units by mouth every Monday.     fenofibrate 160 MG tablet Take 160 mg by mouth daily.  0   ferrous sulfate 325 (65 FE) MG tablet Take 325 mg by mouth daily with breakfast.     fluticasone (FLONASE) 50 MCG/ACT nasal spray Place 1 spray into both nostrils at bedtime as needed for allergies or rhinitis.     folic acid (FOLVITE) 1 MG tablet Take 1 mg by mouth at bedtime.     furosemide (LASIX) 20 MG tablet Take 80 mg by mouth in the morning.     HUMALOG KWIKPEN 100 UNIT/ML KwikPen Inject into the skin.     hydrALAZINE (APRESOLINE) 25 MG tablet Take 1 tablet (25 mg total) by mouth in the morning and at bedtime.     insulin lispro (HUMALOG) 100 UNIT/ML KiwkPen Inject  15 Units into the skin 3 (three) times daily before meals.     LANTUS SOLOSTAR 100 UNIT/ML Solostar Pen Inject 20 Units into the skin at bedtime. (Patient taking differently: Inject 50 Units into the skin at bedtime.) 15 mL 11   latanoprost (XALATAN) 0.005 % ophthalmic solution Place 1 drop into the right eye at bedtime.     oxyCODONE-acetaminophen (PERCOCET) 5-325 MG tablet Take 1 tablet by mouth every 6 (six) hours as needed for severe pain. 12 tablet 0   rosuvastatin (CRESTOR) 20 MG tablet Take 20 mg by mouth at bedtime.  0   traZODone (DESYREL) 50 MG tablet Take 50 mg by mouth at bedtime.     No current facility-administered medications for this visit.     PHYSICAL EXAM: Vitals:   09/29/21 1006  BP: (!) 171/76  Pulse: 67  Resp: 14   Temp: 98.1 F (36.7 C)  TempSrc: Temporal  SpO2: 96%  Weight: 260 lb (117.9 kg)  Height: 6' (1.829 m)    GENERAL: The patient is a well-nourished male, in no acute distress. The vital signs are documented above. Easily palpable left radial pulse.  All surgical incisions are well-healed.  His hand is warm.  MEDICAL ISSUES: He continues to have discomfort with aching sensation in his hand and his arm.  He does have a very superficial area at his thumb nailbed with 1 to 2 mm ulcerative area which is healing.  I reassured him that he has return of normal flow to his hand but has had persistent eye discomfort related to nerve ischemia.  I explained there is no further treatment but that with time this will continue to resolve.  He is asking about narcotic pain medication and I have recommended against this since this will be ongoing for several months and is not specifically surgical pain.  He will continue to use Tylenol  We did discuss that he has no further options in his left arm.  He has always been quite hesitant to proceed with right arm access and understandably is even more so now with the events of his left arm access.  His catheter is working but they have needed to reverse flow at the dialysis center on several occasions.  He will continue to allow resolution of his left arm and will let us know when he is willing to consider right arm access   Rosetta Posner, MD FACS [facility] [place] Office Tel [phone]  Note: Portions of this report may have been transcribed using voice recognition software.  Every effort has been made to ensure accuracy; however, inadvertent computerized transcription errors may still be present.

## 2021-10-15 NOTE — Progress Notes (Unsigned)
POST OPERATIVE OFFICE NOTE    CC:  F/u for surgery  HPI:  This is a 66 y.o. male who is s/p ligation of his left arm AVG on 09/14/2021 by Dr. Virl Cagey.   He had long successful use of a left brachiocephalic fistula which eventually failed.  He had attempted two-stage basilic vein transposition.  He had complication of the second procedure with extensive tissue loss over the incision and eventually had healing of this.  He then had thrombosis of his fistula and never was able to use this.  I placed a left upper arm AV graft and he had good healing of this.  Unfortunately he had a progressive steal and was unable to tolerate this and was taken to the operating room by Dr. Virl Cagey o 09/14/2021 for ligation.  He called our office reporting ongoing swelling and discomfort.  He underwent duplex showing no evidence of DVT on 622 and normal flow through the brachial, ulnar and radial arteries.  Dr. Donnetta Hutching discussed with pt that he had no further options in the left arm.  He has been hesitant to proceed with access in the right arm.  His catheter is working but they have needed to reverse flow at the HD center on several occasions.  He will continue to allow resolution of his left arm and contact us when he wants to proceed.  When seen by Dr. Donnetta Hutching on 09/29/2021, he did have a very superficial area at his thumb nailbed with 1 to 2 mm ulcerative area which was healing. He was not given narcotic pain medication as this was ongoing an not necessarily due specifically to surgical pain and continuing to use Tylenol.    He was seen by Dr. Donnetta Hutching on 09/29/2021   Pt states ***he does *** have pain/numbness in *** hand.    The pt *** on dialysis *** at *** location.  Dialysis access hx: -Left BC AVF 04/08/2015 Dr. [name] -fistulogram left BC AVF 06/24/2016 Dr. Oneida Alar -angioplasty left cephalic vein 35/46/5681 Dr. Trula Slade -left 1st stage BVT 10/27/2020 Dr. Donnetta Hutching -left 2nd stage BVT 01/12/2021 Dr. Donnetta Hutching -I&D left arm  wound 02/18/2021 Dr. Trula Slade -06/29/2021 LUA AVG 06/29/2021 Dr. Donnetta Hutching -left arm AVG ligation 09/14/2021 Dr. Virl Cagey  Allergies  Allergen Reactions   Contrast Media [Iodinated Contrast Media] Hives and Other (See Comments)    Happened "in the 80's"   Other Other (See Comments)    "Transfer Dye" = "Makes me tired"   Acetazolamide Anxiety and Other (See Comments)    Jittery, odd feeling (hyper feeling)    Current Outpatient Medications  Medication Sig Dispense Refill   acetaminophen (TYLENOL) 500 MG tablet Take 1,000 mg by mouth every 8 (eight) hours as needed for mild pain or headache.      amLODipine (NORVASC) 10 MG tablet Take 10 mg by mouth at bedtime.  1   aspirin 81 MG tablet Take 81 mg by mouth at bedtime.     brimonidine (ALPHAGAN) 0.2 % ophthalmic solution Place 1 drop into the right eye 3 (three) times daily.     calcium acetate (PHOSLO) 667 MG capsule Take 667-1,334 mg by mouth See admin instructions. Take 1,334 mg by mouth three times a day with meals and 667 mg with each snack  0   carvedilol (COREG) 12.5 MG tablet take 1 tablet by mouth twice daily.  DO NOT TAKE IN THE MORNING ON DIALYSIS DAYS (Patient taking differently: Take 12.5 mg by mouth See admin instructions. Take 12.5 mg by  mouth two times a day and DO NOT TAKE IN THE MORNING ON DIALYSIS DAYS) 60 tablet 0   cloNIDine (CATAPRES) 0.1 MG tablet Take 1 tablet (0.1 mg total) by mouth at bedtime. (Patient taking differently: Take 0.1 mg by mouth 2 (two) times daily.)     clopidogrel (PLAVIX) 75 MG tablet Take 75 mg by mouth at bedtime.     dorzolamide-timolol (COSOPT) 22.3-6.8 MG/ML ophthalmic solution Place 1 drop into the right eye 2 (two) times daily.     ergocalciferol (VITAMIN D2) 1.25 MG (50000 UT) capsule Take 50,000 Units by mouth every Monday.     fenofibrate 160 MG tablet Take 160 mg by mouth daily.  0   ferrous sulfate 325 (65 FE) MG tablet Take 325 mg by mouth daily with breakfast.     fluticasone (FLONASE) 50  MCG/ACT nasal spray Place 1 spray into both nostrils at bedtime as needed for allergies or rhinitis.     folic acid (FOLVITE) 1 MG tablet Take 1 mg by mouth at bedtime.     furosemide (LASIX) 20 MG tablet Take 80 mg by mouth in the morning.     HUMALOG KWIKPEN 100 UNIT/ML KwikPen Inject into the skin.     hydrALAZINE (APRESOLINE) 25 MG tablet Take 1 tablet (25 mg total) by mouth in the morning and at bedtime.     insulin lispro (HUMALOG) 100 UNIT/ML KiwkPen Inject 15 Units into the skin 3 (three) times daily before meals.     LANTUS SOLOSTAR 100 UNIT/ML Solostar Pen Inject 20 Units into the skin at bedtime. (Patient taking differently: Inject 50 Units into the skin at bedtime.) 15 mL 11   latanoprost (XALATAN) 0.005 % ophthalmic solution Place 1 drop into the right eye at bedtime.     oxyCODONE-acetaminophen (PERCOCET) 5-325 MG tablet Take 1 tablet by mouth every 6 (six) hours as needed for severe pain. 12 tablet 0   rosuvastatin (CRESTOR) 20 MG tablet Take 20 mg by mouth at bedtime.  0   traZODone (DESYREL) 50 MG tablet Take 50 mg by mouth at bedtime.     No current facility-administered medications for this visit.     ROS:  See HPI  Physical Exam:  ***  Incision:  *** Extremities:   There *** a palpable *** pulse.   Motor and sensory *** in tact.   There *** a thrill/bruit present.  The fistula/graft *** easily palpable     Assessment/Plan:  This is a 66 y.o. male who is s/p: igation of his left arm AVG on 09/14/2021 by Dr. Virl Cagey.  -the pt does *** have evidence of steal. -the fistula/graft can be used ***. -if pt has tunneled catheter, this can be removed at the discretion of the dialysis center once the fistula/graft has been accessed to their satisfaction.  -discussed with pt that access does not last forever and will need intervention or even new access at some point.  -the pt will follow up ***   Leontine Locket, [facility] [phone]  Clinic MD:  Carlis Abbott

## 2021-10-19 ENCOUNTER — Other Ambulatory Visit: Payer: Self-pay

## 2021-10-19 ENCOUNTER — Ambulatory Visit (INDEPENDENT_AMBULATORY_CARE_PROVIDER_SITE_OTHER): Payer: Medicare Other | Admitting: Physician Assistant

## 2021-10-19 VITALS — BP 156/81 | HR 74 | Temp 98.7°F | Resp 18 | Ht 72.0 in | Wt 260.0 lb

## 2021-10-19 DIAGNOSIS — N186 End stage renal disease: Secondary | ICD-10-CM

## 2021-10-19 DIAGNOSIS — T82898A Other specified complication of vascular prosthetic devices, implants and grafts, initial encounter: Secondary | ICD-10-CM

## 2021-10-20 ENCOUNTER — Ambulatory Visit: Payer: Medicare Other | Admitting: Vascular Surgery

## 2021-10-21 ENCOUNTER — Ambulatory Visit (HOSPITAL_COMMUNITY)
Admission: RE | Admit: 2021-10-21 | Discharge: 2021-10-21 | Disposition: A | Discharge status: Home / Self Care | Payer: Medicare Other | Attending: Vascular Surgery | Admitting: Vascular Surgery

## 2021-10-21 ENCOUNTER — Encounter (HOSPITAL_COMMUNITY)
Admission: RE | Disposition: A | Discharge status: Home / Self Care | Payer: Self-pay | Source: Home / Self Care | Attending: Vascular Surgery

## 2021-10-21 ENCOUNTER — Other Ambulatory Visit: Payer: Self-pay

## 2021-10-21 DIAGNOSIS — Z992 Dependence on renal dialysis: Secondary | ICD-10-CM | POA: Diagnosis not present

## 2021-10-21 DIAGNOSIS — R200 Anesthesia of skin: Secondary | ICD-10-CM | POA: Diagnosis not present

## 2021-10-21 DIAGNOSIS — L98499 Non-pressure chronic ulcer of skin of other sites with unspecified severity: Secondary | ICD-10-CM | POA: Insufficient documentation

## 2021-10-21 DIAGNOSIS — I7025 Atherosclerosis of native arteries of other extremities with ulceration: Secondary | ICD-10-CM | POA: Diagnosis not present

## 2021-10-21 DIAGNOSIS — N186 End stage renal disease: Secondary | ICD-10-CM | POA: Diagnosis not present

## 2021-10-21 DIAGNOSIS — M79642 Pain in left hand: Secondary | ICD-10-CM | POA: Insufficient documentation

## 2021-10-21 DIAGNOSIS — T82898A Other specified complication of vascular prosthetic devices, implants and grafts, initial encounter: Secondary | ICD-10-CM

## 2021-10-21 HISTORY — PX: UPPER EXTREMITY ANGIOGRAPHY: CATH118270

## 2021-10-21 HISTORY — PX: AORTIC ARCH ANGIOGRAPHY: CATH118224

## 2021-10-21 LAB — GLUCOSE, CAPILLARY: Glucose-Capillary: 80 mg/dL (ref 70–99)

## 2021-10-21 LAB — POCT I-STAT, CHEM 8
BUN: 32 mg/dL — ABNORMAL HIGH (ref 8–23)
Calcium, Ion: 1.18 mmol/L (ref 1.15–1.40)
Chloride: 101 mmol/L (ref 98–111)
Creatinine, Ser: 7.1 mg/dL — ABNORMAL HIGH (ref 0.61–1.24)
Glucose, Bld: 76 mg/dL (ref 70–99)
HCT: 37 % — ABNORMAL LOW (ref 39.0–52.0)
Hemoglobin: 12.6 g/dL — ABNORMAL LOW (ref 13.0–17.0)
Potassium: 4.3 mmol/L (ref 3.5–5.1)
Sodium: 139 mmol/L (ref 135–145)
TCO2: 26 mmol/L (ref 22–32)

## 2021-10-21 SURGERY — UPPER EXTREMITY ANGIOGRAPHY
Anesthesia: LOCAL

## 2021-10-21 MED ORDER — SODIUM CHLORIDE 0.9 % IV SOLN: 250.0000 mL | INTRAVENOUS | Status: DC | PRN

## 2021-10-21 MED ORDER — HEPARIN (PORCINE) IN NACL 1000-0.9 UT/500ML-% IV SOLN
INTRAVENOUS | Status: DC | PRN
  Administered 2021-10-21 (×2): 500 mL

## 2021-10-21 MED ORDER — DIPHENHYDRAMINE HCL 50 MG/ML IJ SOLN
INTRAMUSCULAR | Status: AC
  Filled 2021-10-21: qty 1

## 2021-10-21 MED ORDER — IODIXANOL 320 MG/ML IV SOLN
INTRAVENOUS | Status: DC | PRN
  Administered 2021-10-21: 135 mL

## 2021-10-21 MED ORDER — SODIUM CHLORIDE 0.9% FLUSH: 3.0000 mL | INTRAVENOUS | Status: DC | PRN

## 2021-10-21 MED ORDER — SODIUM CHLORIDE 0.9% FLUSH: 3.0000 mL | Freq: Two times a day (BID) | INTRAVENOUS | Status: DC

## 2021-10-21 MED ORDER — METHYLPREDNISOLONE SODIUM SUCC 125 MG IJ SOLR
125.0000 mg | INTRAMUSCULAR | Status: AC
  Administered 2021-10-21: 125 mg via INTRAVENOUS

## 2021-10-21 MED ORDER — ACETAMINOPHEN 325 MG PO TABS
650.0000 mg | ORAL_TABLET | ORAL | Status: DC | PRN
  Administered 2021-10-21: 650 mg via ORAL
  Filled 2021-10-21: qty 2

## 2021-10-21 MED ORDER — HEPARIN SODIUM (PORCINE) 1000 UNIT/ML IJ SOLN
INTRAMUSCULAR | Status: DC | PRN
  Administered 2021-10-21: 5000 [IU] via INTRAVENOUS

## 2021-10-21 MED ORDER — MIDAZOLAM HCL 2 MG/2ML IJ SOLN
INTRAMUSCULAR | Status: DC | PRN
  Administered 2021-10-21: 1 mg via INTRAVENOUS

## 2021-10-21 MED ORDER — DIPHENHYDRAMINE HCL 50 MG/ML IJ SOLN
25.0000 mg | INTRAMUSCULAR | Status: AC
  Administered 2021-10-21: 25 mg via INTRAVENOUS

## 2021-10-21 MED ORDER — FENTANYL CITRATE (PF) 100 MCG/2ML IJ SOLN
INTRAMUSCULAR | Status: AC
  Filled 2021-10-21: qty 2

## 2021-10-21 MED ORDER — HEPARIN (PORCINE) IN NACL 2000-0.9 UNIT/L-% IV SOLN
INTRAVENOUS | Status: AC
  Filled 2021-10-21: qty 1000

## 2021-10-21 MED ORDER — SODIUM CHLORIDE 0.9% FLUSH
3.0000 mL | Freq: Two times a day (BID) | INTRAVENOUS | Status: DC
Start: 2021-10-21 — End: 2021-10-21

## 2021-10-21 MED ORDER — HYDRALAZINE HCL 20 MG/ML IJ SOLN: 5.0000 mg | INTRAMUSCULAR | Status: DC | PRN

## 2021-10-21 MED ORDER — MIDAZOLAM HCL 2 MG/2ML IJ SOLN
INTRAMUSCULAR | Status: AC
  Filled 2021-10-21: qty 2

## 2021-10-21 MED ORDER — HEPARIN SODIUM (PORCINE) 1000 UNIT/ML IJ SOLN
INTRAMUSCULAR | Status: AC
  Filled 2021-10-21: qty 10

## 2021-10-21 MED ORDER — HEPARIN (PORCINE) IN NACL 1000-0.9 UT/500ML-% IV SOLN
INTRAVENOUS | Status: AC
  Filled 2021-10-21: qty 500

## 2021-10-21 MED ORDER — FENTANYL CITRATE (PF) 100 MCG/2ML IJ SOLN
INTRAMUSCULAR | Status: DC | PRN
  Administered 2021-10-21: 25 ug via INTRAVENOUS

## 2021-10-21 MED ORDER — LIDOCAINE HCL (PF) 1 % IJ SOLN
INTRAMUSCULAR | Status: DC | PRN
  Administered 2021-10-21: 15 mL

## 2021-10-21 MED ORDER — ONDANSETRON HCL 4 MG/2ML IJ SOLN: 4.0000 mg | Freq: Four times a day (QID) | INTRAMUSCULAR | Status: DC | PRN

## 2021-10-21 MED ORDER — LABETALOL HCL 5 MG/ML IV SOLN: 10.0000 mg | INTRAVENOUS | Status: DC | PRN

## 2021-10-21 MED ORDER — METHYLPREDNISOLONE SODIUM SUCC 125 MG IJ SOLR
INTRAMUSCULAR | Status: AC
  Filled 2021-10-21: qty 2

## 2021-10-21 SURGICAL SUPPLY — 12 items
CATH ANGIO 5F PIGTAIL 100CM (CATHETERS) ×1 IMPLANT
CATH ANGIO 5F SIM1 100CM (CATHETERS) ×1 IMPLANT
DEVICE CLOSURE MYNXGRIP 5F (Vascular Products) ×1 IMPLANT
KIT MICROPUNCTURE NIT STIFF (SHEATH) ×1 IMPLANT
KIT PV (KITS) ×2 IMPLANT
SHEATH PINNACLE 5F 10CM (SHEATH) ×1 IMPLANT
SHEATH PROBE COVER 6X72 (BAG) ×1 IMPLANT
SYR MEDRAD MARK V 150ML (SYRINGE) ×1 IMPLANT
TRANSDUCER W/STOPCOCK (MISCELLANEOUS) ×2 IMPLANT
TRAY PV CATH (CUSTOM PROCEDURE TRAY) ×2 IMPLANT
WIRE BENTSON .035X145CM (WIRE) ×1 IMPLANT
WIRE HI TORQ VERSACORE 300 (WIRE) ×1 IMPLANT

## 2021-10-21 NOTE — Discharge Instructions (Signed)
Femoral Site Care This sheet gives you information about how to care for yourself after your procedure. Your health care provider may also give you more specific instructions. If you have problems or questions, contact your health care provider. What can I expect after the procedure?  After the procedure, it is common to have: Bruising that usually fades within 1-2 weeks. Tenderness at the site. Follow these instructions at home: Wound care Follow instructions from your health care provider about how to take care of your insertion site. Make sure you: Wash your hands with soap and water before you change your bandage (dressing). If soap and water are not available, use hand sanitizer. Remove your dressing as told by your health care provider. 24 hours Do not take baths, swim, or use a hot tub until your health care provider approves. You may shower 24-48 hours after the procedure or as told by your health care provider. Gently wash the site with plain soap and water. Pat the area dry with a clean towel. Do not rub the site. This may cause bleeding. Do not apply powder or lotion to the site. Keep the site clean and dry. Check your femoral site every day for signs of infection. Check for: Redness, swelling, or pain. Fluid or blood. Warmth. Pus or a bad smell. Activity For the first 2-3 days after your procedure, or as long as directed: Avoid climbing stairs as much as possible. Do not squat. Do not lift anything that is heavier than 10 lb (4.5 kg), or the limit that you are told, until your health care provider says that it is safe. For 5 days Rest as directed. Avoid sitting for a long time without moving. Get up to take short walks every 1-2 hours. Do not drive for 24 hours if you were given a medicine to help you relax (sedative). General instructions Take over-the-counter and prescription medicines only as told by your health care provider. Keep all follow-up visits as told by your  health care provider. This is important. Contact a health care provider if you have: A fever or chills. You have redness, swelling, or pain around your insertion site. Get help right away if: The catheter insertion area swells very fast. You pass out. You suddenly start to sweat or your skin gets clammy. The catheter insertion area is bleeding, and the bleeding does not stop when you hold steady pressure on the area. The area near or just beyond the catheter insertion site becomes pale, cool, tingly, or numb. These symptoms may represent a serious problem that is an emergency. Do not wait to see if the symptoms will go away. Get medical help right away. Call your local emergency services (911 in the [place] Do not drive yourself to the hospital. Summary After the procedure, it is common to have bruising that usually fades within 1-2 weeks. Check your femoral site every day for signs of infection. Do not lift anything that is heavier than 10 lb (4.5 kg), or the limit that you are told, until your health care provider says that it is safe. This information is not intended to replace advice given to you by your health care provider. Make sure you discuss any questions you have with your health care provider. Document Revised: 04/03/2017 Document Reviewed: 04/03/2017 Elsevier Patient Education  2020 [employer]  

## 2021-10-21 NOTE — H&P (Signed)
History and Physical Interval Note:  10/21/2021 1:21 PM  [name].  has presented today for surgery, with the diagnosis of instage renal - steal syndrome.  The various methods of treatment have been discussed with the patient and family. After consideration of risks, benefits and other options for treatment, the patient has consented to  Procedure(s): Upper Extremity Angiography (N/A) as a surgical intervention.  The patient's history has been reviewed, patient examined, no change in status, stable for surgery.  I have reviewed the patient's chart and labs.  Questions were answered to the patient's satisfaction.     Marty Heck  POST OPERATIVE OFFICE NOTE       CC:  F/u for surgery   HPI:  This is a 66 y.o. male who is s/p ligation of his left arm AVG on 09/14/2021 by Dr. Virl Cagey.    He had long successful use of a left brachiocephalic fistula which eventually failed.  He had attempted two-stage basilic vein transposition.  He had complication of the second procedure with extensive tissue loss over the incision and eventually had healing of this.  He then had thrombosis of his fistula and never was able to use this.  I placed a left upper arm AV graft and he had good healing of this.  Unfortunately he had a progressive steal and was unable to tolerate this and was taken to the operating room by Dr. Virl Cagey o 09/14/2021 for ligation.  He called our office reporting ongoing swelling and discomfort.  He underwent duplex showing no evidence of DVT on 6/22 and normal flow through the brachial, ulnar and radial arteries.   Dr. Donnetta Hutching discussed with [name] that he had no further options in the left arm.  He has been hesitant to proceed with access in the right arm.  His catheter is working but they have needed to reverse flow at the HD center on several occasions.  He will continue to allow resolution of his left arm and contact us when he wants to proceed.  When seen by Dr. Donnetta Hutching on 09/29/2021,  he did have a very superficial area at his thumb nailbed with 1 to 2 mm ulcerative area which was healing. He was not given narcotic pain medication as this was ongoing an not necessarily due specifically to surgical pain and continuing to use Tylenol.     He was seen by Dr. Donnetta Hutching on 09/29/2021, he was continuing to have some discomfort with aching in he hand and arm on the left.  He discussed that his persistent pain was related to nerve ischemia and not further treatment at that time.     Since then, [name] has developed a new ulceration on the left 2nd finger and numbness in the tips of all fingers since the ligation.  The tip of the 2nd finger is whitish in color.  He also has superficial areas on the thumb nailbed, which was present when Dr. Donnetta Hutching saw him and on the nailbed of the left 2nd finger.     [name] does not have any further dialysis access options in the left arm.    The [name] is on dialysis M/W/F.   Dialysis access hx: -Left BC AVF 04/08/2015 Dr. [name] -fistulogram left BC AVF 06/24/2016 Dr. Oneida Alar -angioplasty left cephalic vein 84/16/6063 Dr. Trula Slade -left 1st stage BVT 10/27/2020 Dr. Donnetta Hutching -left 2nd stage BVT 01/12/2021 Dr. Donnetta Hutching -I&D left arm wound 02/18/2021 Dr. Trula Slade -06/29/2021 LUA AVG 06/29/2021 Dr. Donnetta Hutching -left arm AVG ligation  09/14/2021 Dr. Virl Cagey        Allergies  Allergen Reactions   Contrast Media [Iodinated Contrast Media] Hives and Other (See Comments)      Happened "in the 80's"   Other Other (See Comments)      "Transfer Dye" = "Makes me tired"   Acetazolamide Anxiety and Other (See Comments)      Jittery, odd feeling (hyper feeling)            Current Outpatient Medications  Medication Sig Dispense Refill   acetaminophen (TYLENOL) 500 MG tablet Take 1,000 mg by mouth every 8 (eight) hours as needed for mild pain or headache.        amLODipine (NORVASC) 10 MG tablet Take 10 mg by mouth at bedtime.   1   aspirin 81 MG tablet Take 81 mg by mouth at bedtime.        brimonidine (ALPHAGAN) 0.2 % ophthalmic solution Place 1 drop into the right eye 3 (three) times daily.       calcium acetate (PHOSLO) 667 MG capsule Take 667-1,334 mg by mouth See admin instructions. Take 1,334 mg by mouth three times a day with meals and 667 mg with each snack   0   carvedilol (COREG) 12.5 MG tablet take 1 tablet by mouth twice daily.  DO NOT TAKE IN THE MORNING ON DIALYSIS DAYS (Patient taking differently: Take 12.5 mg by mouth See admin instructions. Take 12.5 mg by mouth two times a day and DO NOT TAKE IN THE MORNING ON DIALYSIS DAYS) 60 tablet 0   cloNIDine (CATAPRES) 0.1 MG tablet Take 1 tablet (0.1 mg total) by mouth at bedtime. (Patient taking differently: Take 0.1 mg by mouth 2 (two) times daily.)       clopidogrel (PLAVIX) 75 MG tablet Take 75 mg by mouth at bedtime.       dorzolamide-timolol (COSOPT) 22.3-6.8 MG/ML ophthalmic solution Place 1 drop into the right eye 2 (two) times daily.       ergocalciferol (VITAMIN D2) 1.25 MG (50000 UT) capsule Take 50,000 Units by mouth every Monday.       fenofibrate 160 MG tablet Take 160 mg by mouth daily.   0   ferrous sulfate 325 (65 FE) MG tablet Take 325 mg by mouth daily with breakfast.       fluticasone (FLONASE) 50 MCG/ACT nasal spray Place 1 spray into both nostrils at bedtime as needed for allergies or rhinitis.       folic acid (FOLVITE) 1 MG tablet Take 1 mg by mouth at bedtime.       furosemide (LASIX) 20 MG tablet Take 80 mg by mouth in the morning.       HUMALOG KWIKPEN 100 UNIT/ML KwikPen Inject into the skin.       hydrALAZINE (APRESOLINE) 25 MG tablet Take 1 tablet (25 mg total) by mouth in the morning and at bedtime.       insulin lispro (HUMALOG) 100 UNIT/ML KiwkPen Inject 15 Units into the skin 3 (three) times daily before meals.       LANTUS SOLOSTAR 100 UNIT/ML Solostar Pen Inject 20 Units into the skin at bedtime. (Patient taking differently: Inject 50 Units into the skin at bedtime.) 15 mL 11   latanoprost  (XALATAN) 0.005 % ophthalmic solution Place 1 drop into the right eye at bedtime.       oxyCODONE-acetaminophen (PERCOCET) 5-325 MG tablet Take 1 tablet by mouth every 6 (six) hours as needed for severe pain.  12 tablet 0   rosuvastatin (CRESTOR) 20 MG tablet Take 20 mg by mouth at bedtime.   0   traZODone (DESYREL) 50 MG tablet Take 50 mg by mouth at bedtime.        No current facility-administered medications for this visit.       ROS:  See HPI   Physical Exam:       Today's Vitals    10/19/21 0956 10/19/21 0959  BP: (!) 156/81    Pulse: 74    Resp: 18    Temp: 98.7 F (37.1 C)    TempSrc: Oral    SpO2: 95%    Weight: 260 lb (117.9 kg)    Height: 6' (1.829 m)    PainSc:   1     Body mass index is 35.26 kg/m.     Incision:  healed Extremities:   There is multiphasic doppler flow in the left radial/ulnar and palmar arch He cannot bend his fingers for grip.  He has numbness in the tips of the fingers as well as the entire 2nd finger.                  Assessment/Plan:  This is a 66 y.o. male who is s/p: igation of his left arm AVG on 09/14/2021 by Dr. Virl Cagey.   -the [name] had steal syndrome and his access was ligated last month.  He does have multiphasic doppler flow in the left radial/ular/palmar arch but has developed a new ulceration on the left 2nd finger.  Discussed with Dr. Carlis Abbott and given new ulceration, will schedule [name] of left arm angiography to optimize blood flow.   -may need referral to hand surgery in the future.     Leontine Locket, [facility] [phone]   Clinic MD:  Carlis Abbott

## 2021-10-21 NOTE — Op Note (Signed)
    Patient name: [name]. MRN: [id number] DOB: [birth year] Sex: male  10/21/2021 Pre-operative Diagnosis: Tissue loss left upper extremity including ulcers on the first and third digits Post-operative diagnosis:  Same Surgeon:  Marty Heck, MD Procedure Performed: 1.  Ultrasound-guided access right common femoral artery 2.  Arch aortogram with catheter selection of aorta 3.  Left upper extremity arteriogram with selection of second-order branches 4.  Mynx closure of the right common femoral artery 5.  47 minutes of monitored moderate conscious sedation time  Indications: Patient is a 66 year old male with end-stage renal disease that had ligation of his left upper arm AV graft for steal.  He was seen in the office earlier this week with progressive tissue loss in the left hand since ligation with pain in his digits.  He presents for upper extremity arteriogram to further evaluate his runoff after risk benefits discussed.  Findings:   Arch aortogram that showed a widely patent left subclavian artery with no evidence of ostial stenosis.  The left vertebral artery was patent and there did appear to be some disease at the ostium that is not flow-limiting.  We could partially visualize the innominate.  After selection of the left subclavian artery the left axillary, brachial arteries are widely patent without flow-limiting stenosis.  The left radial interosseous and ulnar are all patent.  Dominant runoff is through the radial artery with a fairly diminutive ulnar artery.  Significant small vessel disease in the hand with a very diseased palmar arch and only filling of one digital artery in the third and fourth digits.     Procedure:  The patient was identified in the holding area and taken to room 8.  The patient was then placed supine on the table and prepped and draped in the usual sterile fashion.  A time out was called.  Ultrasound was used to evaluate the right common femoral  artery.  It was patent .  A digital ultrasound image was acquired.  A micropuncture needle was used to access the right common femoral artery under ultrasound guidance.  An 018 wire was advanced without resistance and a micropuncture sheath was placed.  The 018 wire was removed and a benson wire was placed.  The micropuncture sheath was exchanged for a 5 french sheath.  A catheter was then advanced into the ascending aorta which was a pigtail catheter.  A limited arch aortogram was obtained with steep left anterior oblique imaging.  We then used initially headhunter catheter and then a Simmons catheter to select the left subclavian artery with a versa core wire.  We then got dedicated left upper extremity imaging with pertinent findings noted above.  Ultimately there was no large vessel occlusive disease in the left upper extremity amendable to intervention.  Wires and catheters were removed.  An access shot was obtained of the right groin and a mynx closure device was deployed.  Plan: No evidence of a large vessel proximal occlusive disease in the left upper extremity.  Significant small vessel disease in the hand.  Will refer to hand surgery.  Marty Heck, MD [facility] [place] Office: [phone]

## 2021-10-22 ENCOUNTER — Encounter (HOSPITAL_COMMUNITY): Payer: Self-pay | Admitting: Vascular Surgery

## 2021-10-22 LAB — GLUCOSE, CAPILLARY: Glucose-Capillary: 100 mg/dL — ABNORMAL HIGH (ref 70–99)

## 2021-10-27 ENCOUNTER — Other Ambulatory Visit (INDEPENDENT_AMBULATORY_CARE_PROVIDER_SITE_OTHER): Payer: Medicare Other

## 2021-10-27 ENCOUNTER — Other Ambulatory Visit (HOSPITAL_COMMUNITY): Payer: Self-pay | Admitting: Vascular Surgery

## 2021-10-27 ENCOUNTER — Ambulatory Visit (INDEPENDENT_AMBULATORY_CARE_PROVIDER_SITE_OTHER): Payer: Medicare Other | Admitting: Vascular Surgery

## 2021-10-27 ENCOUNTER — Encounter: Payer: Self-pay | Admitting: Vascular Surgery

## 2021-10-27 VITALS — BP 167/90 | HR 70 | Temp 97.7°F | Resp 16 | Ht 72.0 in | Wt 260.0 lb

## 2021-10-27 DIAGNOSIS — N19 Unspecified kidney failure: Secondary | ICD-10-CM

## 2021-10-27 DIAGNOSIS — N186 End stage renal disease: Secondary | ICD-10-CM

## 2021-10-27 MED ORDER — GABAPENTIN 300 MG PO CAPS: 300.0000 mg | ORAL_CAPSULE | Freq: Every day | ORAL | 0 refills | Status: DC

## 2021-10-27 NOTE — Progress Notes (Signed)
[facility] [place]  Patient name: [name]. MRN: [id number] DOB: November 22, 1955 Sex: male  REASON FOR VISIT: Follow-up left hand ischemia  HPI: [name] Stucke. is a 66 y.o. male here today for follow-up.  He has had a very difficult history of hemodialysis access.  He is status post left upper arm brachiocephalic fistula which was very successful for a number of years.  This failed and he underwent basilic vein transposition with wound issues interventional failure.  He underwent left upper arm AV graft and had severe steal symptoms and had this graft ligated.  He has had persistent ischemia to his left hand.  Last week he underwent a formal arch and left arm arteriogram at [facility] with Dr. Carlis Abbott.  This showed no evidence of occlusion throughout his subclavian or brachial artery.  He did have patency of his radial and ulnar and interosseous arteries with the radial being the dominant.  He did have diseased but patent palmar arch and flow into the fingers of his hand.  He does have diffuse disease.  Continues to complain of pain in his left hand and some blistering over mainly the third finger.  He has had referral to hand surgery and has not seen them as of yet.  Current Outpatient Medications  Medication Sig Dispense Refill   acetaminophen (TYLENOL) 500 MG tablet Take 1,000 mg by mouth every 8 (eight) hours as needed for mild pain or headache.      amLODipine (NORVASC) 10 MG tablet Take 10 mg by mouth at bedtime.  1   aspirin 81 MG tablet Take 81 mg by mouth at bedtime.     brimonidine (ALPHAGAN) 0.2 % ophthalmic solution Place 1 drop into the right eye 3 (three) times daily.     calcium acetate (PHOSLO) 667 MG capsule Take 667-1,334 mg by mouth See admin instructions. Take 1,334 mg by mouth three times a day with meals and 667 mg with each snack  0   carvedilol (COREG) 12.5 MG tablet take 1 tablet by mouth twice daily.   DO NOT TAKE IN THE MORNING ON DIALYSIS DAYS (Patient taking differently: Take 12.5 mg by mouth See admin instructions. Take 12.5 mg by mouth two times a day and DO NOT TAKE IN THE MORNING ON DIALYSIS DAYS) 60 tablet 0   cloNIDine (CATAPRES) 0.1 MG tablet Take 1 tablet (0.1 mg total) by mouth at bedtime. (Patient taking differently: Take 0.1 mg by mouth 2 (two) times daily.)     clopidogrel (PLAVIX) 75 MG tablet Take 75 mg by mouth at bedtime.     dorzolamide-timolol (COSOPT) 22.3-6.8 MG/ML ophthalmic solution Place 1 drop into the right eye 2 (two) times daily.     ergocalciferol (VITAMIN D2) 1.25 MG (50000 UT) capsule Take 50,000 Units by mouth every Monday.     fenofibrate 160 MG tablet Take 160 mg by mouth daily.  0   ferrous sulfate 325 (65 FE) MG tablet Take 325 mg by mouth daily with breakfast.     fluticasone (FLONASE) 50 MCG/ACT nasal spray Place 1 spray into both nostrils at bedtime as needed for allergies or rhinitis.     folic acid (FOLVITE) 1 MG tablet Take 1 mg by mouth at bedtime.     furosemide (LASIX) 20 MG tablet Take 80 mg by mouth in the morning.     gabapentin (NEURONTIN) 300 MG capsule Take 1 capsule (300 mg total) by mouth daily. 60 capsule 0  HUMALOG KWIKPEN 100 UNIT/ML KwikPen Inject into the skin.     hydrALAZINE (APRESOLINE) 25 MG tablet Take 1 tablet (25 mg total) by mouth in the morning and at bedtime.     insulin lispro (HUMALOG) 100 UNIT/ML KiwkPen Inject 15 Units into the skin 3 (three) times daily before meals.     LANTUS SOLOSTAR 100 UNIT/ML Solostar Pen Inject 20 Units into the skin at bedtime. (Patient taking differently: Inject 50 Units into the skin at bedtime.) 15 mL 11   latanoprost (XALATAN) 0.005 % ophthalmic solution Place 1 drop into the right eye at bedtime.     oxyCODONE-acetaminophen (PERCOCET) 5-325 MG tablet Take 1 tablet by mouth every 6 (six) hours as needed for severe pain. 12 tablet 0   rosuvastatin (CRESTOR) 20 MG tablet Take 20 mg by mouth at  bedtime.  0   traZODone (DESYREL) 50 MG tablet Take 50 mg by mouth at bedtime.     No current facility-administered medications for this visit.     PHYSICAL EXAM: Vitals:   10/27/21 0831  BP: (!) 167/90  Pulse: 70  Resp: 16  Temp: 97.7 F (36.5 C)  TempSrc: Temporal  SpO2: 95%  Weight: 260 lb (117.9 kg)  Height: 6' (1.829 m)    GENERAL: The patient is a well-nourished male, in no acute distress. The vital signs are documented above. No palpable radial pulse.  Superficial blistering over his third finger.  MEDICAL ISSUES: I reviewed the actual films with [name] explained the concern regarding his hand ischemia.  I did suggest trying gabapentin to determine if he can get any symptom relief of this pending the hand referral.  I sent his prescription for gabapentin 300 mg 1 daily to his pharmacy.  We will again contact hand surgeon for urgent referral   Rosetta Posner, MD FACS [facility] [phone]  Note: Portions of this report may have been transcribed using voice recognition software.  Every effort has been made to ensure accuracy; however, inadvertent computerized transcription errors may still be present.

## 2021-10-28 ENCOUNTER — Ambulatory Visit (INDEPENDENT_AMBULATORY_CARE_PROVIDER_SITE_OTHER): Payer: Medicare Other

## 2021-10-28 DIAGNOSIS — I428 Other cardiomyopathies: Secondary | ICD-10-CM | POA: Diagnosis not present

## 2021-10-28 LAB — CUP PACEART REMOTE DEVICE CHECK
Battery Remaining Longevity: 12 mo
Battery Remaining Percentage: 14 %
Brady Statistic RV Percent Paced: 0 %
Date Time Interrogation Session: 20230727035900
HighPow Impedance: 51 Ohm
Implantable Lead Implant Date: 20050810
Implantable Lead Location: 753860
Implantable Lead Model: 158
Implantable Lead Serial Number: 153491
Implantable Pulse Generator Implant Date: 20110722
Lead Channel Impedance Value: 460 Ohm
Lead Channel Pacing Threshold Amplitude: 0.7 V
Lead Channel Pacing Threshold Pulse Width: 0.4 ms
Lead Channel Setting Pacing Amplitude: 2.5 V
Lead Channel Setting Pacing Pulse Width: 0.4 ms
Lead Channel Setting Sensing Sensitivity: 0.4 mV
Pulse Gen Serial Number: 269222

## 2021-11-19 NOTE — Progress Notes (Signed)
Remote ICD transmission.   

## 2021-11-23 ENCOUNTER — Encounter: Payer: Self-pay | Admitting: Vascular Surgery

## 2021-11-23 ENCOUNTER — Ambulatory Visit (INDEPENDENT_AMBULATORY_CARE_PROVIDER_SITE_OTHER): Payer: Medicare Other | Admitting: Vascular Surgery

## 2021-11-23 VITALS — BP 164/75 | HR 73 | Temp 98.4°F | Resp 20 | Ht 72.0 in | Wt 260.0 lb

## 2021-11-23 DIAGNOSIS — I739 Peripheral vascular disease, unspecified: Secondary | ICD-10-CM

## 2021-11-23 NOTE — Progress Notes (Signed)
[facility]  PROGRESS NOTE  ASSESSMENT / PLAN: [name]. is a 66 y.o. male with left hand chronic limb threatening ischemia. I reviewed Dr. Ainsley Spinner recent angiogram in detail. [name] has small vessel disease about the hand with intact, in-line flow to the wrist. I cannot optimize his flow further. I have recommended he follow up with Dr. Greta Doom to discuss options for amputation.    SUBJECTIVE: We reviewed [name] angiogram results.  I explained the grim nature of this problem.  I reviewed the limited options available to [place] for small vessel disease of the hand in the setting of a end-stage renal disease patient who is already suffered major amputations of both lower extremities.  OBJECTIVE: BP (!) 164/75 (BP Location: Right Arm, Patient Position: Sitting, Cuff Size: Normal)   Pulse 73   Temp 98.4 F (36.9 C)   Resp 20   Ht 6' (1.829 m)   Wt 260 lb (117.9 kg)   SpO2 95%   BMI 35.26 kg/m   Chronically ill man in no distress. In a wheelchair Regular rate and rhythm Unlabored  Bilateral below knee amputations Left hand third finger with distal ulceration and inflammation Brisk radial artery doppler signal Palmar arch signal brisk     Latest Ref Rng & Units 10/21/2021   10:06 AM 09/14/2021    8:38 AM 06/29/2021    7:11 AM  CBC  Hemoglobin 13.0 - 17.0 g/dL 12.6  11.9  10.9   Hematocrit 39.0 - 52.0 % 37.0  35.0  32.0         Latest Ref Rng & Units 10/21/2021   10:06 AM 09/14/2021    8:38 AM 06/29/2021    7:11 AM  CMP  Glucose 70 - 99 mg/dL 76  122  117   BUN 8 - 23 mg/dL 32  64  32   Creatinine 0.61 - 1.24 mg/dL 7.10  9.10  7.20   Sodium 135 - 145 mmol/L 139  140  138   Potassium 3.5 - 5.1 mmol/L 4.3  5.6  4.3   Chloride 98 - 111 mmol/L 101  105  100     CrCl cannot be calculated (Patient's most recent lab result is older than the maximum 21 days allowed.).  Dr. Ainsley Spinner angiogram of 04/23/2021 personally reviewed in detail.  Inline  flow noted from aortic arch to the wrist.  About the wrist small vessel disease develops.  His palmar arch does appear partially intact.  The digital vessels are severely disadvantaged.  [name]. Stanford Breed, MD [facility] Phone Number: [phone] 11/23/2021 12:06 PM

## 2022-01-06 ENCOUNTER — Other Ambulatory Visit: Payer: Self-pay | Admitting: Vascular Surgery

## 2022-01-27 ENCOUNTER — Ambulatory Visit (INDEPENDENT_AMBULATORY_CARE_PROVIDER_SITE_OTHER): Payer: Medicare Other

## 2022-01-27 DIAGNOSIS — I428 Other cardiomyopathies: Secondary | ICD-10-CM | POA: Diagnosis not present

## 2022-01-28 LAB — CUP PACEART REMOTE DEVICE CHECK
Battery Remaining Longevity: 6 mo
Battery Remaining Percentage: 5 %
Brady Statistic RV Percent Paced: 0 %
Date Time Interrogation Session: 20231026041700
HighPow Impedance: 52 Ohm
Implantable Lead Connection Status: 753985
Implantable Lead Implant Date: 20050810
Implantable Lead Location: 753860
Implantable Lead Model: 158
Implantable Lead Serial Number: 153491
Implantable Pulse Generator Implant Date: 20110722
Lead Channel Impedance Value: 442 Ohm
Lead Channel Pacing Threshold Amplitude: 0.7 V
Lead Channel Pacing Threshold Pulse Width: 0.4 ms
Lead Channel Setting Pacing Amplitude: 2.5 V
Lead Channel Setting Pacing Pulse Width: 0.4 ms
Lead Channel Setting Sensing Sensitivity: 0.4 mV
Pulse Gen Serial Number: 269222

## 2022-02-02 ENCOUNTER — Telehealth (HOSPITAL_COMMUNITY): Payer: Self-pay | Admitting: *Deleted

## 2022-02-02 NOTE — Telephone Encounter (Signed)
Received fax from [facility], Dr Ilda Mori requesting catheter exchange due to poor functioning CVC, no pull and poor blood flows.  Will give to [facility] to schedule.

## 2022-02-07 ENCOUNTER — Other Ambulatory Visit: Payer: Self-pay

## 2022-02-07 ENCOUNTER — Encounter (HOSPITAL_COMMUNITY): Payer: Self-pay | Admitting: Vascular Surgery

## 2022-02-07 DIAGNOSIS — T829XXA Unspecified complication of cardiac and vascular prosthetic device, implant and graft, initial encounter: Secondary | ICD-10-CM

## 2022-02-07 DIAGNOSIS — N186 End stage renal disease: Secondary | ICD-10-CM

## 2022-02-07 NOTE — Progress Notes (Addendum)
Spoke with pt for pre-op call. Pt has an ICD and CHF. Cardiologist is Dr. Lovena Le. Denies any recent chest pain or shortness of breath. Pt is a type 2 diabetic. States he does not check his blood sugar at home. He states his last A1C was 5.5 two months ago. Instructed pt to take 1/2 of his regular dose of Lantus Insulin tonight,  he will take 25 units. Instructed him not to take his Novolog insulin in the AM. Instructed him to check his blood sugar when he wakes up and every 2 hours until he leaves for the hospital. If blood sugar is 70 or below, treat with 1/2 cup of clear juice (apple or cranberry) and recheck blood sugar 15 minutes after drinking juice. If blood sugar continues to be 70 or below, call the Short Stay department and ask to speak to a nurse. Pt voiced understanding.   Shower instructions given to pt and also asked him to bring his bag of medications with him so pharmacy can review them.  See my previous note about his defibrillator.

## 2022-02-07 NOTE — Progress Notes (Signed)
Late add on pt - pt has a ICD, [employer] brand. Spoke with Heron Sabins, rep for [employer]. He looked pt up and states that he will need a magnet. The Device clinic was closed at this time.

## 2022-02-08 ENCOUNTER — Other Ambulatory Visit: Payer: Self-pay

## 2022-02-08 ENCOUNTER — Encounter (HOSPITAL_COMMUNITY): Payer: Self-pay | Admitting: Emergency Medicine

## 2022-02-08 ENCOUNTER — Ambulatory Visit (HOSPITAL_BASED_OUTPATIENT_CLINIC_OR_DEPARTMENT_OTHER): Payer: Medicare Other | Admitting: Certified Registered"

## 2022-02-08 ENCOUNTER — Encounter (HOSPITAL_COMMUNITY): Payer: Self-pay | Admitting: Vascular Surgery

## 2022-02-08 ENCOUNTER — Ambulatory Visit (HOSPITAL_COMMUNITY): Payer: Medicare Other | Admitting: Certified Registered"

## 2022-02-08 ENCOUNTER — Emergency Department (HOSPITAL_COMMUNITY)
Admission: EM | Admit: 2022-02-08 | Discharge: 2022-02-08 | Disposition: A | Discharge status: Home / Self Care | Payer: Medicare Other | Source: Home / Self Care | Attending: Emergency Medicine | Admitting: Emergency Medicine

## 2022-02-08 ENCOUNTER — Ambulatory Visit (HOSPITAL_COMMUNITY): Payer: Medicare Other

## 2022-02-08 ENCOUNTER — Ambulatory Visit (HOSPITAL_COMMUNITY)
Admission: RE | Admit: 2022-02-08 | Discharge: 2022-02-08 | Disposition: A | Discharge status: Home / Self Care | Payer: Medicare Other | Attending: Vascular Surgery | Admitting: Vascular Surgery

## 2022-02-08 ENCOUNTER — Encounter (HOSPITAL_COMMUNITY)
Admission: RE | Disposition: A | Discharge status: Home / Self Care | Payer: Self-pay | Source: Home / Self Care | Attending: Vascular Surgery

## 2022-02-08 DIAGNOSIS — D649 Anemia, unspecified: Secondary | ICD-10-CM | POA: Diagnosis not present

## 2022-02-08 DIAGNOSIS — E1151 Type 2 diabetes mellitus with diabetic peripheral angiopathy without gangrene: Secondary | ICD-10-CM | POA: Diagnosis not present

## 2022-02-08 DIAGNOSIS — Z6835 Body mass index (BMI) 35.0-35.9, adult: Secondary | ICD-10-CM | POA: Insufficient documentation

## 2022-02-08 DIAGNOSIS — N186 End stage renal disease: Secondary | ICD-10-CM | POA: Insufficient documentation

## 2022-02-08 DIAGNOSIS — I132 Hypertensive heart and chronic kidney disease with heart failure and with stage 5 chronic kidney disease, or end stage renal disease: Secondary | ICD-10-CM | POA: Diagnosis not present

## 2022-02-08 DIAGNOSIS — L7622 Postprocedural hemorrhage and hematoma of skin and subcutaneous tissue following other procedure: Secondary | ICD-10-CM | POA: Insufficient documentation

## 2022-02-08 DIAGNOSIS — T82898A Other specified complication of vascular prosthetic devices, implants and grafts, initial encounter: Secondary | ICD-10-CM

## 2022-02-08 DIAGNOSIS — Y718 Miscellaneous cardiovascular devices associated with adverse incidents, not elsewhere classified: Secondary | ICD-10-CM | POA: Insufficient documentation

## 2022-02-08 DIAGNOSIS — Z7982 Long term (current) use of aspirin: Secondary | ICD-10-CM | POA: Insufficient documentation

## 2022-02-08 DIAGNOSIS — I509 Heart failure, unspecified: Secondary | ICD-10-CM

## 2022-02-08 DIAGNOSIS — D759 Disease of blood and blood-forming organs, unspecified: Secondary | ICD-10-CM | POA: Insufficient documentation

## 2022-02-08 DIAGNOSIS — Z8673 Personal history of transient ischemic attack (TIA), and cerebral infarction without residual deficits: Secondary | ICD-10-CM | POA: Diagnosis not present

## 2022-02-08 DIAGNOSIS — T8249XA Other complication of vascular dialysis catheter, initial encounter: Secondary | ICD-10-CM | POA: Diagnosis present

## 2022-02-08 DIAGNOSIS — Z794 Long term (current) use of insulin: Secondary | ICD-10-CM | POA: Insufficient documentation

## 2022-02-08 DIAGNOSIS — E1122 Type 2 diabetes mellitus with diabetic chronic kidney disease: Secondary | ICD-10-CM | POA: Diagnosis not present

## 2022-02-08 DIAGNOSIS — Z992 Dependence on renal dialysis: Secondary | ICD-10-CM

## 2022-02-08 DIAGNOSIS — Z95810 Presence of automatic (implantable) cardiac defibrillator: Secondary | ICD-10-CM | POA: Diagnosis not present

## 2022-02-08 DIAGNOSIS — Z87891 Personal history of nicotine dependence: Secondary | ICD-10-CM

## 2022-02-08 DIAGNOSIS — L7682 Other postprocedural complications of skin and subcutaneous tissue: Secondary | ICD-10-CM

## 2022-02-08 DIAGNOSIS — N185 Chronic kidney disease, stage 5: Secondary | ICD-10-CM | POA: Diagnosis not present

## 2022-02-08 DIAGNOSIS — T829XXA Unspecified complication of cardiac and vascular prosthetic device, implant and graft, initial encounter: Secondary | ICD-10-CM

## 2022-02-08 HISTORY — PX: EXCHANGE OF A DIALYSIS CATHETER: SHX5818

## 2022-02-08 LAB — GLUCOSE, CAPILLARY
Glucose-Capillary: 216 mg/dL — ABNORMAL HIGH (ref 70–99)
Glucose-Capillary: 205 mg/dL — ABNORMAL HIGH (ref 70–99)

## 2022-02-08 LAB — POCT I-STAT, CHEM 8
BUN: 40 mg/dL — ABNORMAL HIGH (ref 8–23)
Calcium, Ion: 1.21 mmol/L (ref 1.15–1.40)
Chloride: 102 mmol/L (ref 98–111)
Creatinine, Ser: 8.3 mg/dL — ABNORMAL HIGH (ref 0.61–1.24)
Glucose, Bld: 247 mg/dL — ABNORMAL HIGH (ref 70–99)
HCT: 32 % — ABNORMAL LOW (ref 39.0–52.0)
Hemoglobin: 10.9 g/dL — ABNORMAL LOW (ref 13.0–17.0)
Potassium: 5 mmol/L (ref 3.5–5.1)
Sodium: 138 mmol/L (ref 135–145)
TCO2: 26 mmol/L (ref 22–32)

## 2022-02-08 SURGERY — EXCHANGE OF A DIALYSIS CATHETER
Anesthesia: Monitor Anesthesia Care | Site: Neck | Laterality: Right

## 2022-02-08 MED ORDER — PROMETHAZINE HCL 25 MG/ML IJ SOLN: 6.2500 mg | INTRAMUSCULAR | Status: DC | PRN

## 2022-02-08 MED ORDER — OXYCODONE HCL 5 MG/5ML PO SOLN: 5.0000 mg | Freq: Once | ORAL | Status: DC | PRN

## 2022-02-08 MED ORDER — CHLORHEXIDINE GLUCONATE 0.12 % MT SOLN
15.0000 mL | Freq: Once | OROMUCOSAL | Status: AC
  Administered 2022-02-08: 15 mL via OROMUCOSAL
  Filled 2022-02-08: qty 15

## 2022-02-08 MED ORDER — MEPERIDINE HCL 25 MG/ML IJ SOLN: 6.2500 mg | INTRAMUSCULAR | Status: DC | PRN

## 2022-02-08 MED ORDER — CEFAZOLIN SODIUM-DEXTROSE 2-4 GM/100ML-% IV SOLN
2.0000 g | INTRAVENOUS | Status: AC
  Administered 2022-02-08: 2 g via INTRAVENOUS
  Filled 2022-02-08: qty 100

## 2022-02-08 MED ORDER — LIDOCAINE-EPINEPHRINE (PF) 1 %-1:200000 IJ SOLN
INTRAMUSCULAR | Status: DC | PRN
  Administered 2022-02-08: 5 mL

## 2022-02-08 MED ORDER — LIDOCAINE-EPINEPHRINE (PF) 1 %-1:200000 IJ SOLN
INTRAMUSCULAR | Status: AC
  Filled 2022-02-08: qty 30

## 2022-02-08 MED ORDER — LIDOCAINE 2% (20 MG/ML) 5 ML SYRINGE
INTRAMUSCULAR | Status: DC | PRN
  Administered 2022-02-08: 50 mL via INTRAVENOUS

## 2022-02-08 MED ORDER — HEPARIN 6000 UNIT IRRIGATION SOLUTION
Status: AC
  Filled 2022-02-08: qty 500

## 2022-02-08 MED ORDER — ACETAMINOPHEN 500 MG PO TABS
1000.0000 mg | ORAL_TABLET | Freq: Once | ORAL | Status: AC
  Administered 2022-02-08: 1000 mg via ORAL
  Filled 2022-02-08: qty 2

## 2022-02-08 MED ORDER — ONDANSETRON HCL 4 MG/2ML IJ SOLN
INTRAMUSCULAR | Status: DC | PRN
  Administered 2022-02-08: 4 mg via INTRAVENOUS

## 2022-02-08 MED ORDER — GLYCOPYRROLATE 0.2 MG/ML IJ SOLN
INTRAMUSCULAR | Status: DC | PRN
  Administered 2022-02-08: .2 mg via INTRAVENOUS

## 2022-02-08 MED ORDER — FENTANYL CITRATE (PF) 250 MCG/5ML IJ SOLN
INTRAMUSCULAR | Status: DC | PRN
  Administered 2022-02-08: 50 ug via INTRAVENOUS

## 2022-02-08 MED ORDER — INSULIN ASPART 100 UNIT/ML IJ SOLN
0.0000 [IU] | INTRAMUSCULAR | Status: DC | PRN
  Administered 2022-02-08: 2 [IU] via SUBCUTANEOUS
  Filled 2022-02-08: qty 1

## 2022-02-08 MED ORDER — MIDAZOLAM HCL 2 MG/2ML IJ SOLN: 0.5000 mg | Freq: Once | INTRAMUSCULAR | Status: DC | PRN

## 2022-02-08 MED ORDER — HEPARIN SODIUM (PORCINE) 1000 UNIT/ML IJ SOLN
INTRAMUSCULAR | Status: DC | PRN
  Administered 2022-02-08: 3800 [IU]

## 2022-02-08 MED ORDER — SODIUM CHLORIDE 0.9 % IV SOLN: INTRAVENOUS | Status: DC

## 2022-02-08 MED ORDER — OXYCODONE HCL 5 MG PO TABS: 5.0000 mg | ORAL_TABLET | Freq: Once | ORAL | Status: DC | PRN

## 2022-02-08 MED ORDER — FENTANYL CITRATE (PF) 100 MCG/2ML IJ SOLN: 25.0000 ug | INTRAMUSCULAR | Status: DC | PRN

## 2022-02-08 MED ORDER — CHLORHEXIDINE GLUCONATE 4 % EX LIQD: 60.0000 mL | Freq: Once | CUTANEOUS | Status: DC

## 2022-02-08 MED ORDER — ORAL CARE MOUTH RINSE: 15.0000 mL | Freq: Once | OROMUCOSAL | Status: AC

## 2022-02-08 MED ORDER — FENTANYL CITRATE (PF) 100 MCG/2ML IJ SOLN
INTRAMUSCULAR | Status: AC
  Filled 2022-02-08: qty 2

## 2022-02-08 MED ORDER — KETAMINE HCL 10 MG/ML IJ SOLN
INTRAMUSCULAR | Status: DC | PRN
  Administered 2022-02-08: 20 mg via INTRAVENOUS

## 2022-02-08 MED ORDER — HEPARIN SODIUM (PORCINE) 1000 UNIT/ML IJ SOLN
INTRAMUSCULAR | Status: AC
  Filled 2022-02-08: qty 10

## 2022-02-08 MED ORDER — HEPARIN SODIUM (PORCINE) 1000 UNIT/ML IJ SOLN: INTRAMUSCULAR | Status: DC | PRN

## 2022-02-08 MED ORDER — STERILE WATER FOR IRRIGATION IR SOLN
Status: DC | PRN
  Administered 2022-02-08: 1000 mL

## 2022-02-08 MED ORDER — PROPOFOL 500 MG/50ML IV EMUL
INTRAVENOUS | Status: DC | PRN
  Administered 2022-02-08: 50 ug/kg/min via INTRAVENOUS

## 2022-02-08 MED ORDER — PHENYLEPHRINE 80 MCG/ML (10ML) SYRINGE FOR IV PUSH (FOR BLOOD PRESSURE SUPPORT)
PREFILLED_SYRINGE | INTRAVENOUS | Status: DC | PRN
  Administered 2022-02-08 (×4): 80 ug via INTRAVENOUS

## 2022-02-08 MED ORDER — SODIUM CHLORIDE 0.9 % IV SOLN: INTRAVENOUS | Status: DC | PRN

## 2022-02-08 MED ORDER — KETAMINE HCL 50 MG/5ML IJ SOSY
PREFILLED_SYRINGE | INTRAMUSCULAR | Status: AC
  Filled 2022-02-08: qty 5

## 2022-02-08 MED ORDER — PROPOFOL 10 MG/ML IV BOLUS
INTRAVENOUS | Status: DC | PRN
  Administered 2022-02-08: 30 mg via INTRAVENOUS

## 2022-02-08 MED ORDER — HEPARIN 6000 UNIT IRRIGATION SOLUTION
Status: DC | PRN
  Administered 2022-02-08: 1

## 2022-02-08 MED ORDER — CEFAZOLIN SODIUM-DEXTROSE 2-4 GM/100ML-% IV SOLN
INTRAVENOUS | Status: AC
  Filled 2022-02-08: qty 100

## 2022-02-08 SURGICAL SUPPLY — 46 items
ADH SKN CLS APL DERMABOND .7 (GAUZE/BANDAGES/DRESSINGS) ×1
BAG COUNTER SPONGE SURGICOUNT (BAG) ×1 IMPLANT
BAG DECANTER FOR FLEXI CONT (MISCELLANEOUS) ×1 IMPLANT
BAG SPNG CNTER NS LX DISP (BAG) ×1
BIOPATCH RED 1 DISK 7.0 (GAUZE/BANDAGES/DRESSINGS) ×1 IMPLANT
CATH PALINDROME-P 19CM W/VT (CATHETERS) IMPLANT
CATH PALINDROME-P 23CM W/VT (CATHETERS) IMPLANT
CATH PALINDROME-P 28CM W/VT (CATHETERS) IMPLANT
COVER PROBE W GEL 5X96 (DRAPES) IMPLANT
COVER SURGICAL LIGHT HANDLE (MISCELLANEOUS) ×1 IMPLANT
DERMABOND ADVANCED .7 DNX12 (GAUZE/BANDAGES/DRESSINGS) IMPLANT
DRAPE C-ARM 42X72 X-RAY (DRAPES) ×1 IMPLANT
DRAPE CHEST BREAST 15X10 FENES (DRAPES) ×1 IMPLANT
DRSG COVADERM 4X6 (GAUZE/BANDAGES/DRESSINGS) IMPLANT
ELECT SOLID GEL RDN PRO-PADZ (MISCELLANEOUS) ×1
ELECTRODE SOLI GEL RDN PROPADZ (MISCELLANEOUS) IMPLANT
GAUZE SPONGE 4X4 12PLY STRL (GAUZE/BANDAGES/DRESSINGS) IMPLANT
GLOVE SS BIOGEL STRL SZ 6 (GLOVE) IMPLANT
GLOVE SS BIOGEL STRL SZ 7.5 (GLOVE) ×1 IMPLANT
GLOVE SURG MICRO LTX SZ7.5 (GLOVE) ×1 IMPLANT
GOWN STRL REUS W/ TWL LRG LVL3 (GOWN DISPOSABLE) ×3 IMPLANT
GOWN STRL REUS W/TWL LRG LVL3 (GOWN DISPOSABLE) ×3
KIT BASIN OR (CUSTOM PROCEDURE TRAY) ×1 IMPLANT
KIT PALINDROME-P 55CM (CATHETERS) IMPLANT
KIT TURNOVER KIT B (KITS) ×1 IMPLANT
NDL 18GX1X1/2 (RX/OR ONLY) (NEEDLE) ×1 IMPLANT
NDL 22X1.5 STRL (OR ONLY) (MISCELLANEOUS) ×1 IMPLANT
NDL HYPO 25GX1X1/2 BEV (NEEDLE) ×1 IMPLANT
NEEDLE 18GX1X1/2 (RX/OR ONLY) (NEEDLE) ×1 IMPLANT
NEEDLE 22X1.5 STRL (OR ONLY) (MISCELLANEOUS) ×1 IMPLANT
NEEDLE HYPO 25GX1X1/2 BEV (NEEDLE) ×1 IMPLANT
NS IRRIG 1000ML POUR BTL (IV SOLUTION) ×1 IMPLANT
PACK BASIC III (CUSTOM PROCEDURE TRAY) ×1
PACK SRG BSC III STRL LF ECLPS (CUSTOM PROCEDURE TRAY) ×1 IMPLANT
PAD ARMBOARD 7.5X6 YLW CONV (MISCELLANEOUS) ×2 IMPLANT
POSITIONER HEAD DONUT 9IN (MISCELLANEOUS) ×1 IMPLANT
SOAP 2 % CHG 4 OZ (WOUND CARE) ×1 IMPLANT
SPIKE FLUID TRANSFER (MISCELLANEOUS) ×1 IMPLANT
SUT ETHILON 3 0 PS 1 (SUTURE) ×1 IMPLANT
SUT VICRYL 4-0 PS2 18IN ABS (SUTURE) ×1 IMPLANT
SYR 10ML LL (SYRINGE) ×1 IMPLANT
SYR 20ML LL LF (SYRINGE) ×1 IMPLANT
SYR 5ML LL (SYRINGE) ×2 IMPLANT
SYR CONTROL 10ML LL (SYRINGE) ×1 IMPLANT
TOWEL GREEN STERILE (TOWEL DISPOSABLE) ×1 IMPLANT
WATER STERILE IRR 1000ML POUR (IV SOLUTION) ×1 IMPLANT

## 2022-02-08 NOTE — Progress Notes (Signed)
Remote ICD transmission.   

## 2022-02-08 NOTE — Anesthesia Postprocedure Evaluation (Addendum)
Anesthesia Post Note  Patient: [name].  Procedure(s) Performed: EXCHANGE OF A TUNNELED DIALYSIS CATHETER (Right: Neck)     Patient location during evaluation: PACU Anesthesia Type: MAC Level of consciousness: awake and alert, patient cooperative and oriented Pain management: pain level controlled Vital Signs Assessment: post-procedure vital signs reviewed and stable Respiratory status: nonlabored ventilation, spontaneous breathing and respiratory function stable Cardiovascular status: blood pressure returned to baseline and stable Postop Assessment: no apparent nausea or vomiting, able to ambulate and adequate PO intake Anesthetic complications: no   No notable events documented.  Last Vitals:  Vitals:   02/08/22 1120 02/08/22 1135  BP: 119/70 125/67  Pulse: 73 71  Resp: 14 12  Temp:  36.5 C  SpO2: 92% 92%    Last Pain:  Vitals:   02/08/22 1135  TempSrc:   PainSc: 0-No pain                 Ewart Carrera,E. Zuri Bradway

## 2022-02-08 NOTE — H&P (Signed)
   [facility] [place]  Patient name: [name]. MRN: [id number] DOB: November 19, 1955 Sex: male    HPI: [name]. is a 66 y.o. male here today for tunneled hemodialysis catheter exchange.  Has a poorly functioning right IJ catheter.  Has an AICD on the left  Current Facility-Administered Medications  Medication Dose Route Frequency Provider Last Rate Last Admin   0.9 %  sodium chloride infusion   Intravenous Continuous Maciah Schweigert, Arvilla Meres, MD 10 mL/hr at 02/08/22 0858 New Bag at 02/08/22 0858   ceFAZolin (ANCEF) IVPB 2g/100 mL premix  2 g Intravenous 30 min Pre-Op Jerime Arif, Arvilla Meres, MD       chlorhexidine (HIBICLENS) 4 % liquid 4 Application  60 mL Topical Once Anayely Constantine, Arvilla Meres, MD       And   [START ON 02/09/2022] chlorhexidine (HIBICLENS) 4 % liquid 4 Application  60 mL Topical Once Freman Lapage, Arvilla Meres, MD       insulin aspart (novoLOG) injection 0-7 Units  0-7 Units Subcutaneous Q2H PRN Annye Asa, MD   2 Units at 02/08/22 0915     PHYSICAL EXAM: Vitals:   02/08/22 0841  BP: (!) 171/90  Pulse: 78  Resp: 20  Temp: (!) 97.5 F (36.4 C)  TempSrc: Oral  SpO2: 95%  Weight: 117.9 kg  Height: 6' (1.829 m)    GENERAL: The patient is a well-nourished male, in no acute distress. The vital signs are documented above. Catheter in place right IJ  MEDICAL ISSUES: Here today for catheter replacement   Rosetta Posner, MD FACS [facility] [place] Office Tel [phone]  Note: Portions of this report may have been transcribed using voice recognition software.  Every effort has been made to ensure accuracy; however, inadvertent computerized transcription errors may still be present.

## 2022-02-08 NOTE — Anesthesia Preprocedure Evaluation (Addendum)
Anesthesia Evaluation  Patient identified by MRN, date of birth, ID band Patient awake    Reviewed: Allergy & Precautions, NPO status , Patient's Chart, lab work & pertinent test results, reviewed documented beta blocker date and time   History of Anesthesia Complications Negative for: history of anesthetic complications  Airway Mallampati: II  TM Distance: >3 FB Neck ROM: Full    Dental  (+) Dental Advisory Given   Pulmonary shortness of breath, former smoker   breath sounds clear to auscultation       Cardiovascular hypertension, Pt. on medications and Pt. on home beta blockers + Peripheral Vascular Disease and +CHF  + Cardiac Defibrillator (device has never shocked patient)  Rhythm:Regular Rate:Normal  '18 ECHO: EF 35-40%. There is akinesis of the basal-midanteroseptal and    inferoseptal myocardium, Grade 2 DD, no significant valvular abnormalities    Neuro/Psych TIACVA    GI/Hepatic negative GI ROS, Neg liver ROS,,,  Endo/Other  diabetes (glu 216), Insulin Dependent  Morbid obesityBMI 35  Renal/GU ESRF and DialysisRenal disease (MWF)     Musculoskeletal  (+) Arthritis ,    Abdominal  (+) + obese  Peds  Hematology  (+) Blood dyscrasia, anemia   Anesthesia Other Findings   Reproductive/Obstetrics                              Anesthesia Physical Anesthesia Plan  ASA: 4  Anesthesia Plan: MAC   Post-op Pain Management: Tylenol PO (pre-op)*   Induction:   PONV Risk Score and Plan: 2 and Ondansetron and Dexamethasone  Airway Management Planned: Natural Airway and Simple Face Mask  Additional Equipment: None  Intra-op Plan:   Post-operative Plan:   Informed Consent: I have reviewed the patients History and Physical, chart, labs and discussed the procedure including the risks, benefits and alternatives for the proposed anesthesia with the patient or authorized representative  who has indicated his/her understanding and acceptance.     Dental advisory given  Plan Discussed with: CRNA and Surgeon  Anesthesia Plan Comments: (Magnet application to deactivate device perioperatively)         Anesthesia Quick Evaluation

## 2022-02-08 NOTE — ED Provider Notes (Addendum)
[facility] EMERGENCY DEPARTMENT Provider Note   CSN: [id number] Arrival date & time: 02/08/22  1526     History  Chief Complaint  Patient presents with   Vascular Access Problem    [name]. is a 66 y.o. male.  HPI     66 year old male comes in with chief complaint of bleeding from his dialysis port site.  Patient indicates that he had a new dialysis port placed earlier today, previous 1 was removed.  He started noticing bleeding prior to calling 911.  Dressing was applied by the EMS, and the bleeding is now stopped.  Patient is on blood thinner, but it was stopped because he was getting new tunneled catheter placed.  Patient has no pain.  Home Medications Prior to Admission medications   Medication Sig Start Date End Date Taking? Authorizing Provider  acetaminophen (TYLENOL) 500 MG tablet Take 1,000 mg by mouth every 8 (eight) hours as needed for mild pain or headache.     [provider]  amLODipine (NORVASC) 10 MG tablet Take 10 mg by mouth at bedtime. 05/09/17   [provider]  aspirin 81 MG tablet Take 81 mg by mouth at bedtime.    [provider]  brimonidine (ALPHAGAN) 0.2 % ophthalmic solution Place 1 drop into the right eye 3 (three) times daily.    [provider]  calcium acetate (PHOSLO) 667 MG capsule Take 667-1,334 mg by mouth See admin instructions. Take 1,334 mg by mouth three times a day with meals and 667 mg with each snack 03/25/15   [provider]  carvedilol (COREG) 12.5 MG tablet take 1 tablet by mouth twice daily.  DO NOT TAKE IN THE MORNING ON DIALYSIS DAYS Patient taking differently: Take 12.5 mg by mouth See admin instructions. Take 12.5 mg by mouth two times a day and DO NOT TAKE IN THE MORNING ON DIALYSIS DAYS 09/29/20   Evans Lance, MD  cloNIDine (CATAPRES) 0.1 MG tablet Take 1 tablet (0.1 mg total) by mouth at bedtime. Patient taking differently: Take 0.1 mg by mouth 2 (two) times daily. 06/28/16    Domenic Polite, MD  clopidogrel (PLAVIX) 75 MG tablet Take 75 mg by mouth at bedtime.    [provider]  dorzolamide-timolol (COSOPT) 22.3-6.8 MG/ML ophthalmic solution Place 1 drop into the right eye 2 (two) times daily.    [provider]  fluticasone (FLONASE) 50 MCG/ACT nasal spray Place 1 spray into both nostrils at bedtime as needed for allergies or rhinitis.    [provider]  folic acid (FOLVITE) 1 MG tablet Take 1 mg by mouth at bedtime.    [provider]  furosemide (LASIX) 20 MG tablet Take 60 mg by mouth 2 (two) times daily.    [provider]  gabapentin (NEURONTIN) 300 MG capsule TAKE 1 CAPSULE(300 MG) BY MOUTH DAILY Patient taking differently: Take 300 mg by mouth at bedtime. 01/07/22   [name] Sandy, MD  hydrALAZINE (APRESOLINE) 25 MG tablet Take 1 tablet (25 mg total) by mouth in the morning and at bedtime. Patient taking differently: Take 25 mg by mouth See admin instructions. Taking twice daily except on Dialysis on Mon, Wed, Friday only take once daily after dialysis 05/27/21   [name] Bear U, DO  insulin lispro (HUMALOG) 100 UNIT/ML KiwkPen Inject 15-20 Units into the skin 3 (three) times daily before meals. 15 units on Dialysis days Mon, Wed, Friday    [provider]  Jerral Ralph  100 UNIT/ML Solostar Pen Inject 20 Units into the skin at bedtime. Patient taking differently: Inject 50 Units into the skin at bedtime. 05/27/21   [name] Girt, DO  latanoprost (XALATAN) 0.005 % ophthalmic solution Place 1 drop into the right eye at bedtime.    [provider]  rosuvastatin (CRESTOR) 20 MG tablet Take 20 mg by mouth at bedtime. 02/24/15   [provider]  traZODone (DESYREL) 50 MG tablet Take 50-100 mg by mouth at bedtime. 12/25/20   [provider]  triamcinolone cream (KENALOG) 0.1 % Apply 1 Application topically 2 (two) times daily.    [provider]      Allergies     Contrast media [iodinated contrast media], Other, and Acetazolamide    Review of Systems   Review of Systems  Physical Exam Updated Vital Signs BP (!) 151/65 (BP Location: Right Arm)   Pulse 69   Temp 97.8 F (36.6 C) (Oral)   Resp 20   SpO2 95%  Physical Exam Vitals and nursing note reviewed.  Constitutional:      Appearance: He is well-developed.  Cardiovascular:     Rate and Rhythm: Normal rate.  Pulmonary:     Effort: Pulmonary effort is normal.  Musculoskeletal:     Cervical back: Neck supple.  Skin:    General: Skin is warm.     Comments: Patient has dressing around the tunneled catheter that was placed today.  No blood noted on it.  Patient has area just inferior to the tunneled catheter that has an ulcerated wound.  Dressing has been applied on top of it.  There is slight blood tinge to it.  Upon compression of that side, there was expression of more blood.  Neurological:     Mental Status: He is alert and oriented to person, place, and time.     ED Results / Procedures / Treatments   Labs (all labs ordered are listed, but only abnormal results are displayed) Labs Reviewed - No data to display  EKG None  Radiology DG Chest Port 1V same Day  Result Date: 02/08/2022 CLINICAL DATA:  Dialysis catheter change EXAM: PORTABLE CHEST 1 VIEW COMPARISON:  05/25/2021 FINDINGS: Left AICD remains in place, unchanged. Right dialysis catheter tip is in the right heart, likely near the tricuspid valve. No pneumothorax. Low lung volumes with bibasilar atelectasis and vascular crowding. Mild cardiomegaly. IMPRESSION: Right dialysis catheter tip in the right heart near the tricuspid valve. Low lung volumes, vascular congestion and bibasilar atelectasis. Electronically Signed   By: Rolm Baptise M.D.   On: 02/08/2022 12:07   DG C-Arm 1-60 Min-No Report  Result Date: 02/08/2022 Fluoroscopy was utilized by the requesting physician.  No radiographic interpretation.     Procedures Procedures    Medications Ordered in ED Medications - No data to display  ED Course/ Medical Decision Making/ A&P                           Medical Decision Making Problems Addressed: Bleeding at insertion site: acute illness or injury  66 year old male comes in with chief complaint of bleeding from his surgical site. He has history of ESRD on hemodialysis and he indicates that he is on blood thinner, however I do not see any DOAC listed in his medications.  I have reviewed patient's chart.  This morning he had exchange of his tunneled catheter over his right chest.  At this time patient is not  bleeding.  Upon closer exam, and palpation of his chest wall there was expression of small amount of blood from below the tunneled catheter site.  Patient states that he has been stuck in the past in other areas for tunneled catheter.  The tunneled catheter itself appears normal and there is no blood over the dressing site.  My suspicion is that patient likely has hematoma from procedure today and the blood likely was expressed from his previous ulcerated wound.  Patient observed in the ER for extended period time.  We put on quick clot gauze at the site that was bleeding on my exam.  Patient reassessed and there is no active bleeding.  Is comfortable with discharge.  Advised that he keeps the dressing on for at least 24 hours and for him to return to the ER if he starts having rebleed.  I have reviewed and interpreted the x-ray from earlier today.  Tunnel catheter is in site properly. I have reviewed patient's vascular procedure note from earlier today.  Final Clinical Impression(s) / ED Diagnoses Final diagnoses:  Bleeding at insertion site    Rx / DC Orders ED Discharge Orders     None         Varney Biles, MD 02/08/22 [place], Brisa Auth, MD 02/08/22 8295

## 2022-02-08 NOTE — ED Triage Notes (Signed)
Pt reports he had his dialysis port replaced this morning. Pt noticed bleeding coming from the old site. No bleeding noted at this time.

## 2022-02-08 NOTE — Discharge Instructions (Signed)
You were seen in the ER for bleeding from your chest wall.  The bleeding was coming from the insertion site of the older catheter.  We have placed a special clotting gauze, which should help prevent further bleeding.  We recommend that you keep the dressing on for at least 24 hours before removing it. Return to the ER if you start having bleeding again.

## 2022-02-08 NOTE — Transfer of Care (Signed)
Immediate Anesthesia Transfer of Care Note  Patient: [name].  Procedure(s) Performed: EXCHANGE OF A TUNNELED DIALYSIS CATHETER (Right: Neck)  Patient Location: PACU  Anesthesia Type:MAC  Level of Consciousness: awake, alert , and oriented  Airway & Oxygen Therapy: Patient Spontanous Breathing  Post-op Assessment: Report given to RN and Post -op Vital signs reviewed and stable  Post vital signs: Reviewed and stable  Last Vitals:  Vitals Value Taken Time  BP 118/78 02/08/22 1105  Temp 36.5 C 02/08/22 1105  Pulse 75 02/08/22 1112  Resp 15 02/08/22 1114  SpO2 91 % 02/08/22 1112  Vitals shown include unvalidated device data.  Last Pain:  Vitals:   02/08/22 1105  TempSrc:   PainSc: 0-No pain      Patients Stated Pain Goal: 0 (40/35/24 8185)  Complications: No notable events documented.

## 2022-02-08 NOTE — Op Note (Signed)
    OPERATIVE REPORT  DATE OF SURGERY: 02/08/2022  PATIENT: [name] Pick., 66 y.o. male MRN: [id number]  DOB: [birth year]  PRE-OPERATIVE DIAGNOSIS: End-stage renal disease with poorly functioning tunneled hemodialysis catheter  POST-OPERATIVE DIAGNOSIS:  Same  PROCEDURE: Exchange of tunneled hemodialysis catheter  SURGEON:  Curt Jews, M.D.  PHYSICIAN ASSISTANT: Nurse  The assistant was needed for exposure and to expedite the case  ANESTHESIA: Local with sedation  EBL: per anesthesia record  Total I/O In: 300 [I.V.:200; IV Piggyback:100] Out: -   BLOOD ADMINISTERED: none  DRAINS: none  SPECIMEN: none  COUNTS CORRECT:  YES  PATIENT DISPOSITION:  PACU - hemodynamically stable  PROCEDURE DETAILS: Patient was taken everyplace supine position with area of the right neck and chest and current tunneled catheter was prepped and draped in usual sterile fashion.  Using local anesthesia and incision was made over the entry site into the right internal jugular vein.  The catheter was grasped at this location and was occluded with hemostats and was divided distally.  A guidewire was passed through the existing catheter and the catheter was removed.  A dilator and peel-away sheath was passed over the guidewire.  A new 23 cm tunneled hemodialysis catheter was brought onto the field and a separate stab incision was made over the right anterior chest.  The catheter was brought through this tunnel to the sheath.  The catheter was passed down the peel-away sheath and the peel-away sheath was removed.  The catheter was positioned at the level of the distal right atrium.  The catheter was secured to the skin with a 3-0 nylon stitch and the entry site was closed with a 4-0 subcuticular Vicryl stitch.  Next a separate incision was made over the old catheter Skoff and the cuff was mobilized and the old catheter was removed in its entirety.  This incision was also closed with a 4-0 subcuticular  Vicryl stitch.  Sterile dressing was applied and the patient was transferred to the recovery room where chest x-ray is pending   Rosetta Posner, M.D., [facility] 02/08/2022 11:07 AM  Note: Portions of this report may have been transcribed using voice recognition software.  Every effort has been made to ensure accuracy; however, inadvertent computerized transcription errors may still be present.

## 2022-02-09 ENCOUNTER — Encounter (HOSPITAL_COMMUNITY): Payer: Self-pay | Admitting: Vascular Surgery

## 2022-02-16 ENCOUNTER — Telehealth: Payer: Self-pay

## 2022-02-16 NOTE — Telephone Encounter (Signed)
Betsy from [facility] called to see if pt could be seen ASAP. Pt is in need of possible catheter exchange due to poor blood flow and it is malfunctioning. Pt is scheduled at [facility] Friday, which MD is aware. They will call us if they need further assistance.

## 2022-04-28 ENCOUNTER — Ambulatory Visit (INDEPENDENT_AMBULATORY_CARE_PROVIDER_SITE_OTHER): Payer: Medicare Other

## 2022-04-28 DIAGNOSIS — I428 Other cardiomyopathies: Secondary | ICD-10-CM

## 2022-04-28 LAB — CUP PACEART REMOTE DEVICE CHECK
Battery Remaining Longevity: 3 mo — CL
Battery Remaining Percentage: 1 %
Brady Statistic RV Percent Paced: 0 %
Date Time Interrogation Session: 20240125034500
HighPow Impedance: 50 Ohm
Implantable Lead Connection Status: 753985
Implantable Lead Implant Date: 20050810
Implantable Lead Location: 753860
Implantable Lead Model: 158
Implantable Lead Serial Number: 153491
Implantable Pulse Generator Implant Date: 20110722
Lead Channel Impedance Value: 423 Ohm
Lead Channel Pacing Threshold Amplitude: 0.7 V
Lead Channel Pacing Threshold Pulse Width: 0.4 ms
Lead Channel Setting Pacing Amplitude: 2.5 V
Lead Channel Setting Pacing Pulse Width: 0.4 ms
Lead Channel Setting Sensing Sensitivity: 0.4 mV
Pulse Gen Serial Number: 269222

## 2022-05-04 ENCOUNTER — Telehealth: Payer: Self-pay

## 2022-05-04 NOTE — Telephone Encounter (Signed)
Spoke with patient informed him his ICD had reached ERI on 05/03/22 patient agreeable to apt with Dr. Lovena Le on 2/13/2 in [facility] to discuss generator change

## 2022-05-16 ENCOUNTER — Other Ambulatory Visit: Payer: Self-pay

## 2022-05-16 ENCOUNTER — Emergency Department (HOSPITAL_COMMUNITY)
Admission: EM | Admit: 2022-05-16 | Discharge: 2022-05-16 | Disposition: A | Discharge status: Home / Self Care | Payer: Medicare Other | Attending: Emergency Medicine | Admitting: Emergency Medicine

## 2022-05-16 ENCOUNTER — Encounter (HOSPITAL_COMMUNITY): Payer: Self-pay | Admitting: Emergency Medicine

## 2022-05-16 ENCOUNTER — Emergency Department (HOSPITAL_COMMUNITY): Payer: Medicare Other

## 2022-05-16 DIAGNOSIS — Z7982 Long term (current) use of aspirin: Secondary | ICD-10-CM | POA: Insufficient documentation

## 2022-05-16 DIAGNOSIS — Z79899 Other long term (current) drug therapy: Secondary | ICD-10-CM | POA: Diagnosis not present

## 2022-05-16 DIAGNOSIS — E1122 Type 2 diabetes mellitus with diabetic chronic kidney disease: Secondary | ICD-10-CM | POA: Insufficient documentation

## 2022-05-16 DIAGNOSIS — W050XXA Fall from non-moving wheelchair, initial encounter: Secondary | ICD-10-CM | POA: Diagnosis not present

## 2022-05-16 DIAGNOSIS — Z794 Long term (current) use of insulin: Secondary | ICD-10-CM | POA: Insufficient documentation

## 2022-05-16 DIAGNOSIS — I509 Heart failure, unspecified: Secondary | ICD-10-CM | POA: Insufficient documentation

## 2022-05-16 DIAGNOSIS — S161XXA Strain of muscle, fascia and tendon at neck level, initial encounter: Secondary | ICD-10-CM

## 2022-05-16 DIAGNOSIS — N189 Chronic kidney disease, unspecified: Secondary | ICD-10-CM | POA: Insufficient documentation

## 2022-05-16 DIAGNOSIS — I130 Hypertensive heart and chronic kidney disease with heart failure and stage 1 through stage 4 chronic kidney disease, or unspecified chronic kidney disease: Secondary | ICD-10-CM | POA: Diagnosis not present

## 2022-05-16 DIAGNOSIS — S199XXA Unspecified injury of neck, initial encounter: Secondary | ICD-10-CM | POA: Diagnosis present

## 2022-05-16 MED ORDER — METHOCARBAMOL 500 MG PO TABS: 500.0000 mg | ORAL_TABLET | Freq: Three times a day (TID) | ORAL | 0 refills | Status: DC

## 2022-05-16 MED ORDER — HYDROCODONE-ACETAMINOPHEN 5-325 MG PO TABS: ORAL_TABLET | ORAL | 0 refills | Status: DC

## 2022-05-16 MED ORDER — HYDROCODONE-ACETAMINOPHEN 5-325 MG PO TABS
1.0000 | ORAL_TABLET | Freq: Once | ORAL | Status: AC
  Administered 2022-05-16: 1 via ORAL
  Filled 2022-05-16: qty 1

## 2022-05-16 NOTE — ED Provider Notes (Signed)
[name] Note   CSN: [id number] Arrival date & time: 05/16/22  1213     History  No chief complaint on file.   [name]. is a 67 y.o. male.  HPI     [name]. is a 67 y.o. male with hx of hypertension, nonischemic cardiomyopathy, CHF, AICD, chronic kidney disease, diabetes and bilateral BKA's.  Who presents to the Emergency Department complaining of neck pain.  States he slid out of his wheelchair 2 days ago and has been having mid neck pain since then although he does not recall an injury of his neck or head.  Pain of his neck is associated with movement and he does not have pain or numbness radiating into his upper extremities.  He denies fever, chills, visual changes, chest pain or shortness of breath.  He only completed one hour of his routine dialysis today due to his neck pain.    Home Medications Prior to Admission medications   Medication Sig Start Date End Date Taking? Authorizing [name]  acetaminophen (TYLENOL) 500 MG tablet Take 1,000 mg by mouth every 8 (eight) hours as needed for mild pain or headache.     [name], Historical, MD  amLODipine (NORVASC) 10 MG tablet Take 10 mg by mouth at bedtime. 05/09/17   [name], Historical, MD  aspirin 81 MG tablet Take 81 mg by mouth at bedtime.    [name], Historical, MD  brimonidine (ALPHAGAN) 0.2 % ophthalmic solution Place 1 drop into the right eye 3 (three) times daily.    [name], Historical, MD  calcium acetate (PHOSLO) 667 MG capsule Take 667-1,334 mg by mouth See admin instructions. Take 1,334 mg by mouth three times a day with meals and 667 mg with each snack 03/25/15   [name], Historical, MD  carvedilol (COREG) 12.5 MG tablet take 1 tablet by mouth twice daily.  DO NOT TAKE IN THE MORNING ON DIALYSIS DAYS Patient taking differently: Take 12.5 mg by mouth See admin instructions. Take 12.5 mg by mouth two times a day and DO NOT TAKE IN THE MORNING ON  DIALYSIS DAYS 09/29/20   [name] Lance, MD  cloNIDine (CATAPRES) 0.1 MG tablet Take 1 tablet (0.1 mg total) by mouth at bedtime. Patient taking differently: Take 0.1 mg by mouth 2 (two) times daily. 06/28/16   Domenic Polite, MD  clopidogrel (PLAVIX) 75 MG tablet Take 75 mg by mouth at bedtime.    [name], Historical, MD  dorzolamide-timolol (COSOPT) 22.3-6.8 MG/ML ophthalmic solution Place 1 drop into the right eye 2 (two) times daily.    [name], Historical, MD  fluticasone (FLONASE) 50 MCG/ACT nasal spray Place 1 spray into both nostrils at bedtime as needed for allergies or rhinitis.    [name], Historical, MD  folic acid (FOLVITE) 1 MG tablet Take 1 mg by mouth at bedtime.    [name], Historical, MD  furosemide (LASIX) 20 MG tablet Take 60 mg by mouth 2 (two) times daily.    [name], Historical, MD  gabapentin (NEURONTIN) 300 MG capsule TAKE 1 CAPSULE(300 MG) BY MOUTH DAILY Patient taking differently: Take 300 mg by mouth at bedtime. 01/07/22   [name], MD  hydrALAZINE (APRESOLINE) 25 MG tablet Take 1 tablet (25 mg total) by mouth in the morning and at bedtime. Patient taking differently: Take 25 mg by mouth See admin instructions. Taking twice daily except on Dialysis on Mon, Wed, Friday only take once daily after dialysis 05/27/21  [name] Bear U, DO  insulin lispro (HUMALOG) 100 UNIT/ML KiwkPen Inject 15-20 Units into the skin 3 (three) times daily before meals. 15 units on Dialysis days Mon, Wed, Friday    [name], Historical, MD  LANTUS SOLOSTAR 100 UNIT/ML Solostar Pen Inject 20 Units into the skin at bedtime. Patient taking differently: Inject 50 Units into the skin at bedtime. 05/27/21   [name] Girt, DO  latanoprost (XALATAN) 0.005 % ophthalmic solution Place 1 drop into the right eye at bedtime.    [name], Historical, MD  rosuvastatin (CRESTOR) 20 MG tablet Take 20 mg by mouth at bedtime. 02/24/15   [name], Historical, MD  traZODone (DESYREL) 50 MG  tablet Take 50-100 mg by mouth at bedtime. 12/25/20   [name], Historical, MD  triamcinolone cream (KENALOG) 0.1 % Apply 1 Application topically 2 (two) times daily.    [name], Historical, MD      Allergies    Contrast media [iodinated contrast media], Other, and Acetazolamide    Review of Systems   Review of Systems  Constitutional:  Negative for chills and fever.  Eyes:  Negative for visual disturbance.  Respiratory:  Negative for cough, choking, chest tightness and shortness of breath.   Cardiovascular:  Negative for chest pain.  Gastrointestinal:  Negative for diarrhea, nausea and vomiting.  Musculoskeletal:  Positive for neck pain. Negative for back pain and neck stiffness.  Neurological:  Negative for dizziness, syncope, weakness, numbness and headaches.  Psychiatric/Behavioral:  Negative for confusion.     Physical Exam Updated Vital Signs BP (!) 178/87 (BP Location: Right Arm)   Pulse 89   Temp 98.1 F (36.7 C) (Oral)   Resp 18   Ht 6' (1.829 m)   Wt 117.9 kg   SpO2 96%   BMI 35.26 kg/m  Physical Exam Vitals and nursing note reviewed.  Constitutional:      General: He is not in acute distress.    Appearance: Normal appearance. He is not ill-appearing or toxic-appearing.  HENT:     Head: Atraumatic.  Neck:     Trachea: Phonation normal.     Comments: Ttp of the midline cervical spine.  Pain reproduced with rotation and flexion/ extension of the neck.  No nuchal rigidity Cardiovascular:     Rate and Rhythm: Normal rate and regular rhythm.     Pulses: Normal pulses.  Pulmonary:     Effort: Pulmonary effort is normal.     Breath sounds: Normal breath sounds.  Chest:     Chest wall: No tenderness.  Musculoskeletal:     Cervical back: Signs of trauma and tenderness present. No rigidity or crepitus. Pain with movement, spinous process tenderness and muscular tenderness present. Decreased range of motion.     Comments: Bilateral BKA's  Skin:    General: Skin is  warm.     Capillary Refill: Capillary refill takes less than 2 seconds.  Neurological:     General: No focal deficit present.     Mental Status: He is alert.     Sensory: Sensation is intact. No sensory deficit.     Motor: Motor function is intact. No weakness or abnormal muscle tone.     Comments: CN II through XII grossly intact.  Speech clear.  5 out of 5 motor strength of bilateral upper extremities     ED Results / Procedures / Treatments   Labs (all labs ordered are listed, but only abnormal results are displayed) Labs Reviewed - No data to display  EKG  None  Radiology CT Cervical Spine Wo Contrast  Result Date: 05/16/2022 CLINICAL DATA:  Neck pain since morning after sliding out his wheelchair EXAM: CT CERVICAL SPINE WITHOUT CONTRAST TECHNIQUE: Multidetector CT imaging of the cervical spine was performed without intravenous contrast. Multiplanar CT image reconstructions were also generated. RADIATION DOSE REDUCTION: This exam was performed according to the departmental dose-optimization program which includes automated exposure control, adjustment of the mA and/or kV according to patient size and/or use of iterative reconstruction technique. COMPARISON:  None Available. FINDINGS: Alignment: Straightening of the cervical spine Skull base and vertebrae: No acute fracture. No primary bone lesion or focal pathologic process. Soft tissues and spinal canal: No prevertebral fluid or swelling. No visible canal hematoma. Disc levels: C2-C3:  No significant findings C3-C4: Uncovertebral joint arthropathy without significant neural foraminal stenosis C4-C5: Disc height loss and moderate left facet joint arthropathy. Mild left neural foraminal stenosis. C5-C6:  No significant finding C6-C7: Disc height loss and uncovertebral joint arthropathy without significant spinal canal or neural foraminal stenosis C7-T1:  No significant finding Upper chest: Negative. Other: Prominent vertebral artery  atherosclerotic calcifications. IMPRESSION: 1. No acute fracture or traumatic subluxation. 2. Mild multilevel degenerative disc disease and uncovertebral joint arthropathy. 3. Prominent vertebral artery atherosclerotic calcifications. Electronically Signed   By: Keane Police D.O.   On: 05/16/2022 19:14    Procedures Procedures    Medications Ordered in ED Medications - No data to display  ED Course/ Medical Decision Making/ A&P                             Medical Decision Making Patient here for evaluation of neck pain x 2 days.  States that he slipped out of his wheelchair 2 days ago and went onto the floor.  Denies head injury or LOC.  He does not believe that he injured his neck during the fall.  He denies any radiating pain into his arms or hands.  No numbness or weakness of his upper extremities.  No visual changes, headaches or dizziness.  Pain is reproduced with movement of his neck.  On my exam, he does have some midline tenderness of the cervical spine do not appreciate any bony step-offs.  He does have some right-sided cervical paraspinal muscle tenderness as well.  He is neurovascularly intact.  I suspect his injuries are musculoskeletal, this may be a torticollis.  I doubt infectious or neurologic process.  Amount and/or Complexity of Data Reviewed Radiology: ordered.    Details: CT C-spine without evidence of acute fracture or traumatic subluxation, multi level degenerative disc changes Discussion of management or test interpretation with external [name](s): Discussed findings with patient.  Remains neurovascularly intact.  No concerning symptoms for infectious process or emergent neurological process.  Patient agreeable to symptomatic treatment and close outpatient follow-up.  Strict return precautions were discussed.           Final Clinical Impression(s) / ED Diagnoses Final diagnoses:  Acute strain of neck muscle, initial encounter    Rx / DC Orders ED  Discharge Orders     None         Bufford Lope 05/16/22 2114    Godfrey Pick, MD 05/18/22 [phone]

## 2022-05-16 NOTE — ED Triage Notes (Signed)
Pt BIB [facility] from dialysis after completing 1 hour; pt c/o neck pain since Sat am after sliding out of his wheelchair, pain with turning his neck

## 2022-05-16 NOTE — ED Notes (Signed)
Gone to ct

## 2022-05-16 NOTE — Discharge Instructions (Signed)
You may alternate ice and heat to your neck.  Take the medication as directed.  Please follow-up with your primary care provider for recheck or return emergency department for any new or worsening symptoms.

## 2022-05-17 ENCOUNTER — Encounter: Payer: Medicare Other | Admitting: Internal Medicine

## 2022-05-18 NOTE — Progress Notes (Signed)
Remote ICD transmission.   

## 2022-05-24 ENCOUNTER — Emergency Department (HOSPITAL_COMMUNITY): Payer: Medicare Other

## 2022-05-24 ENCOUNTER — Inpatient Hospital Stay (HOSPITAL_COMMUNITY)
Admission: EM | Admit: 2022-05-24 | Discharge: 2022-06-07 | DRG: 314 | Disposition: A | Discharge status: Skilled Nursing Facility | Payer: Medicare Other | Attending: Internal Medicine | Admitting: Internal Medicine

## 2022-05-24 ENCOUNTER — Encounter (HOSPITAL_COMMUNITY): Payer: Self-pay | Admitting: Family Medicine

## 2022-05-24 ENCOUNTER — Other Ambulatory Visit: Payer: Self-pay

## 2022-05-24 DIAGNOSIS — R7881 Bacteremia: Secondary | ICD-10-CM | POA: Diagnosis present

## 2022-05-24 DIAGNOSIS — Z8616 Personal history of COVID-19: Secondary | ICD-10-CM

## 2022-05-24 DIAGNOSIS — N62 Hypertrophy of breast: Secondary | ICD-10-CM | POA: Diagnosis present

## 2022-05-24 DIAGNOSIS — Z89511 Acquired absence of right leg below knee: Secondary | ICD-10-CM

## 2022-05-24 DIAGNOSIS — Z823 Family history of stroke: Secondary | ICD-10-CM

## 2022-05-24 DIAGNOSIS — R234 Changes in skin texture: Secondary | ICD-10-CM | POA: Diagnosis present

## 2022-05-24 DIAGNOSIS — Z794 Long term (current) use of insulin: Secondary | ICD-10-CM

## 2022-05-24 DIAGNOSIS — R0602 Shortness of breath: Principal | ICD-10-CM

## 2022-05-24 DIAGNOSIS — Z8673 Personal history of transient ischemic attack (TIA), and cerebral infarction without residual deficits: Secondary | ICD-10-CM

## 2022-05-24 DIAGNOSIS — Z87891 Personal history of nicotine dependence: Secondary | ICD-10-CM

## 2022-05-24 DIAGNOSIS — A419 Sepsis, unspecified organism: Secondary | ICD-10-CM | POA: Diagnosis present

## 2022-05-24 DIAGNOSIS — D649 Anemia, unspecified: Secondary | ICD-10-CM | POA: Diagnosis present

## 2022-05-24 DIAGNOSIS — I679 Cerebrovascular disease, unspecified: Secondary | ICD-10-CM | POA: Diagnosis present

## 2022-05-24 DIAGNOSIS — Z833 Family history of diabetes mellitus: Secondary | ICD-10-CM

## 2022-05-24 DIAGNOSIS — Z7982 Long term (current) use of aspirin: Secondary | ICD-10-CM

## 2022-05-24 DIAGNOSIS — N289 Disorder of kidney and ureter, unspecified: Secondary | ICD-10-CM

## 2022-05-24 DIAGNOSIS — I330 Acute and subacute infective endocarditis: Secondary | ICD-10-CM | POA: Diagnosis present

## 2022-05-24 DIAGNOSIS — E6609 Other obesity due to excess calories: Secondary | ICD-10-CM | POA: Diagnosis present

## 2022-05-24 DIAGNOSIS — E8889 Other specified metabolic disorders: Secondary | ICD-10-CM | POA: Diagnosis present

## 2022-05-24 DIAGNOSIS — Z9841 Cataract extraction status, right eye: Secondary | ICD-10-CM

## 2022-05-24 DIAGNOSIS — Z961 Presence of intraocular lens: Secondary | ICD-10-CM | POA: Diagnosis present

## 2022-05-24 DIAGNOSIS — R0989 Other specified symptoms and signs involving the circulatory and respiratory systems: Secondary | ICD-10-CM | POA: Diagnosis present

## 2022-05-24 DIAGNOSIS — Z95810 Presence of automatic (implantable) cardiac defibrillator: Secondary | ICD-10-CM

## 2022-05-24 DIAGNOSIS — Z888 Allergy status to other drugs, medicaments and biological substances status: Secondary | ICD-10-CM

## 2022-05-24 DIAGNOSIS — D631 Anemia in chronic kidney disease: Secondary | ICD-10-CM | POA: Diagnosis present

## 2022-05-24 DIAGNOSIS — Y718 Miscellaneous cardiovascular devices associated with adverse incidents, not elsewhere classified: Secondary | ICD-10-CM | POA: Diagnosis present

## 2022-05-24 DIAGNOSIS — I132 Hypertensive heart and chronic kidney disease with heart failure and with stage 5 chronic kidney disease, or end stage renal disease: Secondary | ICD-10-CM | POA: Diagnosis present

## 2022-05-24 DIAGNOSIS — Y838 Other surgical procedures as the cause of abnormal reaction of the patient, or of later complication, without mention of misadventure at the time of the procedure: Secondary | ICD-10-CM | POA: Diagnosis present

## 2022-05-24 DIAGNOSIS — Z79899 Other long term (current) drug therapy: Secondary | ICD-10-CM

## 2022-05-24 DIAGNOSIS — M1990 Unspecified osteoarthritis, unspecified site: Secondary | ICD-10-CM | POA: Diagnosis present

## 2022-05-24 DIAGNOSIS — N186 End stage renal disease: Secondary | ICD-10-CM | POA: Diagnosis present

## 2022-05-24 DIAGNOSIS — E1151 Type 2 diabetes mellitus with diabetic peripheral angiopathy without gangrene: Secondary | ICD-10-CM | POA: Diagnosis not present

## 2022-05-24 DIAGNOSIS — Z635 Disruption of family by separation and divorce: Secondary | ICD-10-CM

## 2022-05-24 DIAGNOSIS — T80211A Bloodstream infection due to central venous catheter, initial encounter: Principal | ICD-10-CM | POA: Diagnosis present

## 2022-05-24 DIAGNOSIS — N2581 Secondary hyperparathyroidism of renal origin: Secondary | ICD-10-CM | POA: Diagnosis present

## 2022-05-24 DIAGNOSIS — Z91041 Radiographic dye allergy status: Secondary | ICD-10-CM

## 2022-05-24 DIAGNOSIS — Z8249 Family history of ischemic heart disease and other diseases of the circulatory system: Secondary | ICD-10-CM

## 2022-05-24 DIAGNOSIS — Z89512 Acquired absence of left leg below knee: Secondary | ICD-10-CM

## 2022-05-24 DIAGNOSIS — K5900 Constipation, unspecified: Secondary | ICD-10-CM | POA: Diagnosis present

## 2022-05-24 DIAGNOSIS — E785 Hyperlipidemia, unspecified: Secondary | ICD-10-CM | POA: Diagnosis present

## 2022-05-24 DIAGNOSIS — I351 Nonrheumatic aortic (valve) insufficiency: Secondary | ICD-10-CM | POA: Diagnosis present

## 2022-05-24 DIAGNOSIS — Z992 Dependence on renal dialysis: Secondary | ICD-10-CM

## 2022-05-24 DIAGNOSIS — Z6833 Body mass index (BMI) 33.0-33.9, adult: Secondary | ICD-10-CM

## 2022-05-24 DIAGNOSIS — I10 Essential (primary) hypertension: Secondary | ICD-10-CM | POA: Diagnosis present

## 2022-05-24 DIAGNOSIS — E1122 Type 2 diabetes mellitus with diabetic chronic kidney disease: Secondary | ICD-10-CM | POA: Diagnosis present

## 2022-05-24 DIAGNOSIS — I5022 Chronic systolic (congestive) heart failure: Secondary | ICD-10-CM | POA: Diagnosis present

## 2022-05-24 DIAGNOSIS — I428 Other cardiomyopathies: Secondary | ICD-10-CM | POA: Diagnosis present

## 2022-05-24 DIAGNOSIS — R34 Anuria and oliguria: Secondary | ICD-10-CM | POA: Diagnosis present

## 2022-05-24 DIAGNOSIS — Z7902 Long term (current) use of antithrombotics/antiplatelets: Secondary | ICD-10-CM

## 2022-05-24 LAB — COMPREHENSIVE METABOLIC PANEL
ALT: 28 U/L (ref 0–44)
AST: 47 U/L — ABNORMAL HIGH (ref 15–41)
Albumin: 2.3 g/dL — ABNORMAL LOW (ref 3.5–5.0)
Alkaline Phosphatase: 71 U/L (ref 38–126)
Anion gap: 17 — ABNORMAL HIGH (ref 5–15)
BUN: 86 mg/dL — ABNORMAL HIGH (ref 8–23)
CO2: 22 mmol/L (ref 22–32)
Calcium: 9.1 mg/dL (ref 8.9–10.3)
Chloride: 92 mmol/L — ABNORMAL LOW (ref 98–111)
Creatinine, Ser: 9.5 mg/dL — ABNORMAL HIGH (ref 0.61–1.24)
GFR, Estimated: 5 mL/min — ABNORMAL LOW (ref >60)
Glucose, Bld: 140 mg/dL — ABNORMAL HIGH (ref 70–99)
Potassium: 4 mmol/L (ref 3.5–5.1)
Sodium: 131 mmol/L — ABNORMAL LOW (ref 135–145)
Total Bilirubin: 1.3 mg/dL — ABNORMAL HIGH (ref 0.3–1.2)
Total Protein: 5.7 g/dL — ABNORMAL LOW (ref 6.5–8.1)

## 2022-05-24 LAB — MAGNESIUM: Magnesium: 2.1 mg/dL (ref 1.7–2.4)

## 2022-05-24 LAB — CBC WITH DIFFERENTIAL/PLATELET
Abs Immature Granulocytes: 0.2 10*3/uL — ABNORMAL HIGH (ref 0.00–0.07)
Basophils Absolute: 0 10*3/uL (ref 0.0–0.1)
Basophils Relative: 0 %
Eosinophils Absolute: 0.3 10*3/uL (ref 0.0–0.5)
Eosinophils Relative: 1 %
HCT: 20.6 % — ABNORMAL LOW (ref 39.0–52.0)
Hemoglobin: 7.1 g/dL — ABNORMAL LOW (ref 13.0–17.0)
Immature Granulocytes: 1 %
Lymphocytes Relative: 5 %
Lymphs Abs: 1 10*3/uL (ref 0.7–4.0)
MCH: 30.6 pg (ref 26.0–34.0)
MCHC: 34.5 g/dL (ref 30.0–36.0)
MCV: 88.8 fL (ref 80.0–100.0)
Monocytes Absolute: 1 10*3/uL (ref 0.1–1.0)
Monocytes Relative: 6 %
Neutro Abs: 16 10*3/uL — ABNORMAL HIGH (ref 1.7–7.7)
Neutrophils Relative %: 87 %
Platelets: 129 10*3/uL — ABNORMAL LOW (ref 150–400)
RBC: 2.32 MIL/uL — ABNORMAL LOW (ref 4.22–5.81)
RDW: 16.7 % — ABNORMAL HIGH (ref 11.5–15.5)
WBC: 18.5 10*3/uL — ABNORMAL HIGH (ref 4.0–10.5)
nRBC: 0 % (ref 0.0–0.2)

## 2022-05-24 LAB — PROTIME-INR
INR: 1.3 — ABNORMAL HIGH (ref 0.8–1.2)
Prothrombin Time: 15.8 seconds — ABNORMAL HIGH (ref 11.4–15.2)

## 2022-05-24 LAB — BRAIN NATRIURETIC PEPTIDE: B Natriuretic Peptide: 427 pg/mL — ABNORMAL HIGH (ref 0.0–100.0)

## 2022-05-24 LAB — LACTIC ACID, PLASMA
Lactic Acid, Venous: 1 mmol/L (ref 0.5–1.9)
Lactic Acid, Venous: 0.9 mmol/L (ref 0.5–1.9)

## 2022-05-24 MED ORDER — VANCOMYCIN VARIABLE DOSE PER UNSTABLE RENAL FUNCTION (PHARMACIST DOSING): Status: DC

## 2022-05-24 MED ORDER — SODIUM CHLORIDE 0.9 % IV SOLN
2.0000 g | Freq: Once | INTRAVENOUS | Status: AC
  Administered 2022-05-24: 2 g via INTRAVENOUS
  Filled 2022-05-24: qty 12.5

## 2022-05-24 MED ORDER — VANCOMYCIN HCL IN DEXTROSE 1-5 GM/200ML-% IV SOLN
1000.0000 mg | Freq: Once | INTRAVENOUS | Status: DC
  Filled 2022-05-24: qty 200

## 2022-05-24 MED ORDER — FENTANYL CITRATE PF 50 MCG/ML IJ SOSY
50.0000 ug | PREFILLED_SYRINGE | Freq: Once | INTRAMUSCULAR | Status: AC
  Administered 2022-05-24: 50 ug via INTRAVENOUS
  Filled 2022-05-24: qty 1

## 2022-05-24 MED ORDER — VANCOMYCIN HCL 2000 MG/400ML IV SOLN
2000.0000 mg | Freq: Once | INTRAVENOUS | Status: AC
  Administered 2022-05-24: 2000 mg via INTRAVENOUS
  Filled 2022-05-24: qty 400

## 2022-05-24 MED ORDER — METRONIDAZOLE 500 MG/100ML IV SOLN
500.0000 mg | Freq: Once | INTRAVENOUS | Status: AC
  Administered 2022-05-24: 500 mg via INTRAVENOUS
  Filled 2022-05-24: qty 100

## 2022-05-24 NOTE — ED Provider Notes (Signed)
[facility] Provider Note   CSN: [id number] Arrival date & time: 05/24/22  1541     History {Add pertinent medical, surgical, social history, OB history to HPI:1} Chief Complaint  Patient presents with   Shortness of Breath    [name]. is a 67 y.o. male.   Shortness of Breath Patient presents for concern of infection.  He gets dialysis on M, W, F.  On Friday, he was told he has a blood infection.  Only recent symptom has been pain in his gluteal area.  Patient missed dialysis yesterday.  He currently denies any shortness of breath.     Home Medications Prior to Admission medications   Medication Sig Start Date End Date Taking? Authorizing Provider  amLODipine (NORVASC) 10 MG tablet Take 10 mg by mouth at bedtime. 05/09/17   [provider]  aspirin 81 MG tablet Take 81 mg by mouth at bedtime.    [provider]  brimonidine (ALPHAGAN) 0.2 % ophthalmic solution Place 1 drop into the right eye 3 (three) times daily.    [provider]  calcium acetate (PHOSLO) 667 MG capsule Take 667-1,334 mg by mouth See admin instructions. Take 1,334 mg by mouth three times a day with meals and 667 mg with each snack 03/25/15   [provider]  carvedilol (COREG) 12.5 MG tablet take 1 tablet by mouth twice daily.  DO NOT TAKE IN THE MORNING ON DIALYSIS DAYS Patient taking differently: Take 12.5 mg by mouth See admin instructions. Take 12.5 mg by mouth two times a day and DO NOT TAKE IN THE MORNING ON DIALYSIS DAYS 09/29/20   Evans Lance, MD  cloNIDine (CATAPRES) 0.1 MG tablet Take 1 tablet (0.1 mg total) by mouth at bedtime. Patient taking differently: Take 0.1 mg by mouth 2 (two) times daily. 06/28/16   Domenic Polite, MD  clopidogrel (PLAVIX) 75 MG tablet Take 75 mg by mouth at bedtime.    [provider]  dorzolamide-timolol (COSOPT) 22.3-6.8 MG/ML ophthalmic solution Place 1 drop into the right  eye 2 (two) times daily.    [provider]  fluticasone (FLONASE) 50 MCG/ACT nasal spray Place 1 spray into both nostrils at bedtime as needed for allergies or rhinitis.    [provider]  folic acid (FOLVITE) 1 MG tablet Take 1 mg by mouth at bedtime.    [provider]  furosemide (LASIX) 20 MG tablet Take 60 mg by mouth 2 (two) times daily.    [provider]  gabapentin (NEURONTIN) 300 MG capsule TAKE 1 CAPSULE(300 MG) BY MOUTH DAILY Patient taking differently: Take 300 mg by mouth at bedtime. 01/07/22   [name], MD  hydrALAZINE (APRESOLINE) 25 MG tablet Take 1 tablet (25 mg total) by mouth in the morning and at bedtime. Patient taking differently: Take 25 mg by mouth See admin instructions. Taking twice daily except on Dialysis on Mon, Wed, Friday only take once daily after dialysis 05/27/21   [name], DO  HYDROcodone-acetaminophen (NORCO/VICODIN) 5-325 MG tablet Take one tab po q 4 hrs prn pain 05/16/22   Triplett, Tammy, PA-C  insulin lispro (HUMALOG) 100 UNIT/ML KiwkPen Inject 15-20 Units into the skin 3 (three) times daily before meals. 15 units on Dialysis days Mon, Wed, Friday    [provider]  LANTUS SOLOSTAR 100 UNIT/ML Solostar Pen Inject 20 Units into the skin at bedtime. Patient taking differently: Inject 50 Units into the  skin at bedtime. 05/27/21   [name], DO  latanoprost (XALATAN) 0.005 % ophthalmic solution Place 1 drop into the right eye at bedtime.    [provider]  methocarbamol (ROBAXIN) 500 MG tablet Take 1 tablet (500 mg total) by mouth 3 (three) times daily. 05/16/22   Triplett, Tammy, PA-C  rosuvastatin (CRESTOR) 20 MG tablet Take 20 mg by mouth at bedtime. 02/24/15   [provider]  traZODone (DESYREL) 50 MG tablet Take 50-100 mg by mouth at bedtime. 12/25/20   [provider]  triamcinolone cream (KENALOG) 0.1 % Apply 1 Application topically 2 (two) times daily.     [provider]      Allergies    Contrast media [iodinated contrast media], Other, and Acetazolamide    Review of Systems   Review of Systems  Respiratory:  Positive for shortness of breath.     Physical Exam Updated Vital Signs BP (!) 138/95 (BP Location: Right Arm)   Pulse 80   Temp 98.1 F (36.7 C) (Oral)   Resp 20   Ht 6' (1.829 m)   Wt 117.9 kg   SpO2 94%   BMI 35.26 kg/m  Physical Exam  ED Results / Procedures / Treatments   Labs (all labs ordered are listed, but only abnormal results are displayed) Labs Reviewed  CULTURE, BLOOD (ROUTINE X 2)  CULTURE, BLOOD (ROUTINE X 2)  LACTIC ACID, PLASMA  LACTIC ACID, PLASMA  COMPREHENSIVE METABOLIC PANEL  CBC WITH DIFFERENTIAL/PLATELET  PROTIME-INR  URINALYSIS, ROUTINE W REFLEX MICROSCOPIC  BRAIN NATRIURETIC PEPTIDE  MAGNESIUM    EKG None  Radiology No results found.  Procedures Procedures  {Document cardiac monitor, telemetry assessment procedure when appropriate:1}  Medications Ordered in ED Medications - No data to display  ED Course/ Medical Decision Making/ A&P   {   Click here for ABCD2, HEART and other calculatorsREFRESH Note before signing :1}                          Medical Decision Making Amount and/or Complexity of Data Reviewed Labs: ordered. Radiology: ordered. ECG/medicine tests: ordered.   ***  {Document critical care time when appropriate:1} {Document review of labs and clinical decision tools ie heart score, Chads2Vasc2 etc:1}  {Document your independent review of radiology images, and any outside records:1} {Document your discussion with family members, caretakers, and with consultants:1} {Document social determinants of health affecting pt's care:1} {Document your decision making why or why not admission, treatments were needed:1} Final Clinical Impression(s) / ED Diagnoses Final diagnoses:  None    Rx / DC Orders ED Discharge Orders     None

## 2022-05-24 NOTE — Progress Notes (Signed)
Pharmacy Antibiotic Note  [name]. is a 67 y.o. male hx ESRD/HD admitted on 05/24/2022 with an unknown infection .  Pharmacy has been consulted for Vancomycin and cefepime dosing. Patient gets HD on MWF but missed his session yesterday  Plan: Cefepime 2gm IV now Vancomycin 2gm IV now Determine next HD session and dose accordingly (cefepime 2gm and Vanc 1gm qHD)  Height: 6' (182.9 cm) Weight: 117.9 kg (260 lb) IBW/kg (Calculated) : 77.6  Temp (24hrs), Avg:98.1 F (36.7 C), Min:98.1 F (36.7 C), Max:98.1 F (36.7 C)  Recent Labs  Lab 05/24/22 1725  WBC 18.5*  CREATININE 9.50*  LATICACIDVEN 1.0    Estimated Creatinine Clearance: 9.7 mL/min (A) (by C-G formula based on SCr of 9.5 mg/dL (H)).    Allergies  Allergen Reactions   Contrast Media [Iodinated Contrast Media] Hives and Other (See Comments)    Happened "in the 80's"   Other Other (See Comments)    "Transfer Dye" = "Makes me tired"   Acetazolamide Anxiety and Other (See Comments)    Jittery, odd feeling (hyper feeling)    Antimicrobials this admission:  Microbiology results: 2/20 Bcx P  Thank you for allowing pharmacy to be a part of this patient's care.  Lorenso Courier, PharmD Clinical Pharmacist 05/24/2022 7:56 PM

## 2022-05-24 NOTE — ED Notes (Signed)
Patient transported to X-ray 

## 2022-05-24 NOTE — ED Notes (Signed)
Pt states he does not make urine anymore.  

## 2022-05-24 NOTE — ED Notes (Signed)
Both sets of blood cultures drawn before any antibiotic administration

## 2022-05-24 NOTE — ED Notes (Signed)
Pt is a bilateral amputee of legs

## 2022-05-24 NOTE — ED Notes (Signed)
Patient transported to CT 

## 2022-05-24 NOTE — ED Triage Notes (Signed)
Pt arrived via [facility] from home in which EMS states dialysis employee called them and informed them that he was possibly septic--he missed dialysis today, typically gets dialysis 3 days/week. C/o generalized pain, esp on his bottom. Also s/o SOB SpO2 96 RA.

## 2022-05-25 DIAGNOSIS — N186 End stage renal disease: Secondary | ICD-10-CM | POA: Diagnosis present

## 2022-05-25 DIAGNOSIS — E6609 Other obesity due to excess calories: Secondary | ICD-10-CM | POA: Diagnosis present

## 2022-05-25 DIAGNOSIS — Z794 Long term (current) use of insulin: Secondary | ICD-10-CM | POA: Diagnosis not present

## 2022-05-25 DIAGNOSIS — R0602 Shortness of breath: Secondary | ICD-10-CM | POA: Diagnosis present

## 2022-05-25 DIAGNOSIS — N62 Hypertrophy of breast: Secondary | ICD-10-CM | POA: Diagnosis present

## 2022-05-25 DIAGNOSIS — E1151 Type 2 diabetes mellitus with diabetic peripheral angiopathy without gangrene: Secondary | ICD-10-CM | POA: Diagnosis present

## 2022-05-25 DIAGNOSIS — I339 Acute and subacute endocarditis, unspecified: Secondary | ICD-10-CM | POA: Diagnosis not present

## 2022-05-25 DIAGNOSIS — B957 Other staphylococcus as the cause of diseases classified elsewhere: Secondary | ICD-10-CM | POA: Diagnosis not present

## 2022-05-25 DIAGNOSIS — I509 Heart failure, unspecified: Secondary | ICD-10-CM | POA: Diagnosis not present

## 2022-05-25 DIAGNOSIS — R34 Anuria and oliguria: Secondary | ICD-10-CM | POA: Diagnosis present

## 2022-05-25 DIAGNOSIS — I361 Nonrheumatic tricuspid (valve) insufficiency: Secondary | ICD-10-CM | POA: Diagnosis not present

## 2022-05-25 DIAGNOSIS — Z89512 Acquired absence of left leg below knee: Secondary | ICD-10-CM | POA: Diagnosis not present

## 2022-05-25 DIAGNOSIS — Z992 Dependence on renal dialysis: Secondary | ICD-10-CM | POA: Diagnosis not present

## 2022-05-25 DIAGNOSIS — Y838 Other surgical procedures as the cause of abnormal reaction of the patient, or of later complication, without mention of misadventure at the time of the procedure: Secondary | ICD-10-CM | POA: Diagnosis present

## 2022-05-25 DIAGNOSIS — D631 Anemia in chronic kidney disease: Secondary | ICD-10-CM | POA: Diagnosis present

## 2022-05-25 DIAGNOSIS — I351 Nonrheumatic aortic (valve) insufficiency: Secondary | ICD-10-CM | POA: Diagnosis not present

## 2022-05-25 DIAGNOSIS — Z87891 Personal history of nicotine dependence: Secondary | ICD-10-CM | POA: Diagnosis not present

## 2022-05-25 DIAGNOSIS — E1122 Type 2 diabetes mellitus with diabetic chronic kidney disease: Secondary | ICD-10-CM | POA: Diagnosis present

## 2022-05-25 DIAGNOSIS — A419 Sepsis, unspecified organism: Secondary | ICD-10-CM | POA: Diagnosis present

## 2022-05-25 DIAGNOSIS — I132 Hypertensive heart and chronic kidney disease with heart failure and with stage 5 chronic kidney disease, or end stage renal disease: Secondary | ICD-10-CM | POA: Diagnosis present

## 2022-05-25 DIAGNOSIS — Z8249 Family history of ischemic heart disease and other diseases of the circulatory system: Secondary | ICD-10-CM | POA: Diagnosis not present

## 2022-05-25 DIAGNOSIS — Y718 Miscellaneous cardiovascular devices associated with adverse incidents, not elsewhere classified: Secondary | ICD-10-CM | POA: Diagnosis present

## 2022-05-25 DIAGNOSIS — Z89511 Acquired absence of right leg below knee: Secondary | ICD-10-CM | POA: Diagnosis not present

## 2022-05-25 DIAGNOSIS — I428 Other cardiomyopathies: Secondary | ICD-10-CM | POA: Diagnosis present

## 2022-05-25 DIAGNOSIS — I5022 Chronic systolic (congestive) heart failure: Secondary | ICD-10-CM | POA: Diagnosis present

## 2022-05-25 DIAGNOSIS — T80211A Bloodstream infection due to central venous catheter, initial encounter: Secondary | ICD-10-CM | POA: Diagnosis present

## 2022-05-25 DIAGNOSIS — I350 Nonrheumatic aortic (valve) stenosis: Secondary | ICD-10-CM | POA: Diagnosis not present

## 2022-05-25 DIAGNOSIS — Z8616 Personal history of COVID-19: Secondary | ICD-10-CM | POA: Diagnosis not present

## 2022-05-25 DIAGNOSIS — E785 Hyperlipidemia, unspecified: Secondary | ICD-10-CM | POA: Diagnosis present

## 2022-05-25 DIAGNOSIS — N2581 Secondary hyperparathyroidism of renal origin: Secondary | ICD-10-CM | POA: Diagnosis present

## 2022-05-25 DIAGNOSIS — I330 Acute and subacute infective endocarditis: Secondary | ICD-10-CM | POA: Diagnosis present

## 2022-05-25 DIAGNOSIS — R7881 Bacteremia: Secondary | ICD-10-CM | POA: Diagnosis not present

## 2022-05-25 DIAGNOSIS — Z6833 Body mass index (BMI) 33.0-33.9, adult: Secondary | ICD-10-CM | POA: Diagnosis not present

## 2022-05-25 DIAGNOSIS — E8889 Other specified metabolic disorders: Secondary | ICD-10-CM | POA: Diagnosis present

## 2022-05-25 LAB — BASIC METABOLIC PANEL
Anion gap: 16 — ABNORMAL HIGH (ref 5–15)
BUN: 94 mg/dL — ABNORMAL HIGH (ref 8–23)
CO2: 21 mmol/L — ABNORMAL LOW (ref 22–32)
Calcium: 8.9 mg/dL (ref 8.9–10.3)
Chloride: 95 mmol/L — ABNORMAL LOW (ref 98–111)
Creatinine, Ser: 9.91 mg/dL — ABNORMAL HIGH (ref 0.61–1.24)
GFR, Estimated: 5 mL/min — ABNORMAL LOW (ref >60)
Glucose, Bld: 134 mg/dL — ABNORMAL HIGH (ref 70–99)
Potassium: 4.1 mmol/L (ref 3.5–5.1)
Sodium: 132 mmol/L — ABNORMAL LOW (ref 135–145)

## 2022-05-25 LAB — HEPATITIS B SURFACE ANTIGEN
Hepatitis B Surface Ag: NONREACTIVE
Hepatitis B Surface Ag: NONREACTIVE

## 2022-05-25 LAB — IRON AND TIBC: Iron: 42 ug/dL — ABNORMAL LOW (ref 45–182)

## 2022-05-25 LAB — CBC
HCT: 21.4 % — ABNORMAL LOW (ref 39.0–52.0)
Hemoglobin: 7.1 g/dL — ABNORMAL LOW (ref 13.0–17.0)
MCH: 30.5 pg (ref 26.0–34.0)
MCHC: 33.2 g/dL (ref 30.0–36.0)
MCV: 91.8 fL (ref 80.0–100.0)
Platelets: 130 10*3/uL — ABNORMAL LOW (ref 150–400)
RBC: 2.33 MIL/uL — ABNORMAL LOW (ref 4.22–5.81)
RDW: 16.6 % — ABNORMAL HIGH (ref 11.5–15.5)
WBC: 19.3 10*3/uL — ABNORMAL HIGH (ref 4.0–10.5)
nRBC: 0 % (ref 0.0–0.2)

## 2022-05-25 LAB — VITAMIN B12: Vitamin B-12: 268 pg/mL (ref 180–914)

## 2022-05-25 LAB — GLUCOSE, CAPILLARY
Glucose-Capillary: 116 mg/dL — ABNORMAL HIGH (ref 70–99)
Glucose-Capillary: 141 mg/dL — ABNORMAL HIGH (ref 70–99)
Glucose-Capillary: 160 mg/dL — ABNORMAL HIGH (ref 70–99)

## 2022-05-25 LAB — HEPATIC FUNCTION PANEL
ALT: 24 U/L (ref 0–44)
AST: 33 U/L (ref 15–41)
Albumin: 2.1 g/dL — ABNORMAL LOW (ref 3.5–5.0)
Alkaline Phosphatase: 66 U/L (ref 38–126)
Bilirubin, Direct: 0.2 mg/dL (ref 0.0–0.2)
Indirect Bilirubin: 1.1 mg/dL — ABNORMAL HIGH (ref 0.3–0.9)
Total Bilirubin: 1.3 mg/dL — ABNORMAL HIGH (ref 0.3–1.2)
Total Protein: 5.5 g/dL — ABNORMAL LOW (ref 6.5–8.1)

## 2022-05-25 LAB — PHOSPHORUS: Phosphorus: 6.4 mg/dL — ABNORMAL HIGH (ref 2.5–4.6)

## 2022-05-25 LAB — FERRITIN: Ferritin: 4259 ng/mL — ABNORMAL HIGH (ref 24–336)

## 2022-05-25 LAB — HEMOGLOBIN A1C
Hgb A1c MFr Bld: 5.6 % (ref 4.8–5.6)
Mean Plasma Glucose: 114.02 mg/dL

## 2022-05-25 LAB — CBG MONITORING, ED: Glucose-Capillary: 123 mg/dL — ABNORMAL HIGH (ref 70–99)

## 2022-05-25 LAB — HIV ANTIBODY (ROUTINE TESTING W REFLEX): HIV Screen 4th Generation wRfx: NONREACTIVE

## 2022-05-25 LAB — MRSA NEXT GEN BY PCR, NASAL: MRSA by PCR Next Gen: NOT DETECTED

## 2022-05-25 LAB — RETICULOCYTES
Immature Retic Fract: 30.7 % — ABNORMAL HIGH (ref 2.3–15.9)
RBC.: 2.34 MIL/uL — ABNORMAL LOW (ref 4.22–5.81)
Retic Count, Absolute: 59.4 10*3/uL (ref 19.0–186.0)
Retic Ct Pct: 2.5 % (ref 0.4–3.1)

## 2022-05-25 LAB — PREPARE RBC (CROSSMATCH)

## 2022-05-25 LAB — FOLATE: Folate: 25.3 ng/mL (ref >5.9)

## 2022-05-25 MED ORDER — HYDRALAZINE HCL 25 MG PO TABS: 25.0000 mg | ORAL_TABLET | ORAL | Status: DC

## 2022-05-25 MED ORDER — AMLODIPINE BESYLATE 10 MG PO TABS
10.0000 mg | ORAL_TABLET | Freq: Every day | ORAL | Status: DC
  Administered 2022-05-25 – 2022-06-01 (×9): 10 mg via ORAL
  Filled 2022-05-25: qty 1
  Filled 2022-05-25 (×2): qty 2
  Filled 2022-05-25 (×3): qty 1
  Filled 2022-05-25: qty 2
  Filled 2022-05-25 (×4): qty 1

## 2022-05-25 MED ORDER — FOLIC ACID 1 MG PO TABS
1.0000 mg | ORAL_TABLET | Freq: Every day | ORAL | Status: DC
  Administered 2022-05-25 – 2022-06-06 (×14): 1 mg via ORAL
  Filled 2022-05-25 (×14): qty 1

## 2022-05-25 MED ORDER — SODIUM CHLORIDE 0.9% FLUSH
3.0000 mL | Freq: Two times a day (BID) | INTRAVENOUS | Status: DC
  Administered 2022-05-25 – 2022-06-07 (×25): 3 mL via INTRAVENOUS

## 2022-05-25 MED ORDER — BRIMONIDINE TARTRATE 0.2 % OP SOLN
1.0000 [drp] | Freq: Three times a day (TID) | OPHTHALMIC | Status: DC
  Administered 2022-05-25 – 2022-06-07 (×30): 1 [drp] via OPHTHALMIC
  Filled 2022-05-25 (×3): qty 5

## 2022-05-25 MED ORDER — METRONIDAZOLE 500 MG/100ML IV SOLN
500.0000 mg | Freq: Two times a day (BID) | INTRAVENOUS | Status: DC
  Administered 2022-05-25: 500 mg via INTRAVENOUS
  Filled 2022-05-25: qty 100

## 2022-05-25 MED ORDER — ACETAMINOPHEN 650 MG RE SUPP: 650.0000 mg | Freq: Four times a day (QID) | RECTAL | Status: DC | PRN

## 2022-05-25 MED ORDER — LIDOCAINE HCL (PF) 1 % IJ SOLN: 5.0000 mL | INTRAMUSCULAR | Status: DC | PRN

## 2022-05-25 MED ORDER — ROSUVASTATIN CALCIUM 20 MG PO TABS
20.0000 mg | ORAL_TABLET | Freq: Every day | ORAL | Status: DC
  Administered 2022-05-25 – 2022-06-06 (×14): 20 mg via ORAL
  Filled 2022-05-25 (×14): qty 1

## 2022-05-25 MED ORDER — ONDANSETRON HCL 4 MG PO TABS: 4.0000 mg | ORAL_TABLET | Freq: Four times a day (QID) | ORAL | Status: DC | PRN

## 2022-05-25 MED ORDER — INSULIN ASPART 100 UNIT/ML IJ SOLN
0.0000 [IU] | Freq: Every day | INTRAMUSCULAR | Status: DC
  Administered 2022-05-28: 2 [IU] via SUBCUTANEOUS

## 2022-05-25 MED ORDER — HEPARIN SODIUM (PORCINE) 5000 UNIT/ML IJ SOLN
5000.0000 [IU] | Freq: Three times a day (TID) | INTRAMUSCULAR | Status: DC
  Administered 2022-05-25 – 2022-06-07 (×37): 5000 [IU] via SUBCUTANEOUS
  Filled 2022-05-25 (×36): qty 1

## 2022-05-25 MED ORDER — CLONIDINE HCL 0.1 MG PO TABS
0.1000 mg | ORAL_TABLET | Freq: Every day | ORAL | Status: DC
  Administered 2022-05-25 – 2022-06-04 (×10): 0.1 mg via ORAL
  Filled 2022-05-25 (×12): qty 1

## 2022-05-25 MED ORDER — ACETAMINOPHEN 325 MG PO TABS
650.0000 mg | ORAL_TABLET | Freq: Four times a day (QID) | ORAL | Status: DC | PRN
  Administered 2022-05-26 – 2022-06-03 (×4): 650 mg via ORAL
  Filled 2022-05-25 (×6): qty 2

## 2022-05-25 MED ORDER — HEPARIN SODIUM (PORCINE) 1000 UNIT/ML IJ SOLN
INTRAMUSCULAR | Status: AC
  Administered 2022-05-25: 2300 [IU] via INTRAVENOUS_CENTRAL
  Filled 2022-05-25: qty 7

## 2022-05-25 MED ORDER — HEPARIN SODIUM (PORCINE) 1000 UNIT/ML DIALYSIS: 20.0000 [IU]/kg | INTRAMUSCULAR | Status: DC | PRN

## 2022-05-25 MED ORDER — INSULIN ASPART 100 UNIT/ML IJ SOLN
0.0000 [IU] | Freq: Three times a day (TID) | INTRAMUSCULAR | Status: DC
  Administered 2022-05-25 – 2022-05-28 (×5): 1 [IU] via SUBCUTANEOUS
  Administered 2022-05-29: 2 [IU] via SUBCUTANEOUS
  Administered 2022-05-29 – 2022-06-02 (×8): 1 [IU] via SUBCUTANEOUS
  Administered 2022-06-03: 0 [IU] via SUBCUTANEOUS
  Administered 2022-06-03 – 2022-06-06 (×6): 1 [IU] via SUBCUTANEOUS

## 2022-05-25 MED ORDER — INSULIN GLARGINE-YFGN 100 UNIT/ML ~~LOC~~ SOLN
10.0000 [IU] | Freq: Every day | SUBCUTANEOUS | Status: DC
  Administered 2022-05-25 – 2022-06-06 (×14): 10 [IU] via SUBCUTANEOUS
  Filled 2022-05-25 (×17): qty 0.1

## 2022-05-25 MED ORDER — CARVEDILOL 12.5 MG PO TABS: 12.5000 mg | ORAL_TABLET | ORAL | Status: DC

## 2022-05-25 MED ORDER — OXYCODONE HCL 5 MG PO TABS
5.0000 mg | ORAL_TABLET | ORAL | Status: DC | PRN
  Administered 2022-05-25 – 2022-05-31 (×4): 5 mg via ORAL
  Filled 2022-05-25 (×5): qty 1

## 2022-05-25 MED ORDER — HEPARIN SODIUM (PORCINE) 1000 UNIT/ML DIALYSIS
1000.0000 [IU] | INTRAMUSCULAR | Status: DC | PRN
  Administered 2022-05-25: 1000 [IU]

## 2022-05-25 MED ORDER — ONDANSETRON HCL 4 MG/2ML IJ SOLN: 4.0000 mg | Freq: Four times a day (QID) | INTRAMUSCULAR | Status: DC | PRN

## 2022-05-25 MED ORDER — CHLORHEXIDINE GLUCONATE CLOTH 2 % EX PADS
6.0000 | MEDICATED_PAD | Freq: Every day | CUTANEOUS | Status: DC
  Administered 2022-05-25 – 2022-06-03 (×9): 6 via TOPICAL

## 2022-05-25 MED ORDER — CHLORHEXIDINE GLUCONATE CLOTH 2 % EX PADS
6.0000 | MEDICATED_PAD | Freq: Every day | CUTANEOUS | Status: DC
  Administered 2022-05-26: 6 via TOPICAL

## 2022-05-25 MED ORDER — INSULIN ASPART 100 UNIT/ML IJ SOLN
3.0000 [IU] | Freq: Three times a day (TID) | INTRAMUSCULAR | Status: DC
  Administered 2022-05-25 – 2022-05-29 (×13): 3 [IU] via SUBCUTANEOUS

## 2022-05-25 MED ORDER — CEFAZOLIN SODIUM-DEXTROSE 2-4 GM/100ML-% IV SOLN
2.0000 g | INTRAVENOUS | Status: DC
  Administered 2022-05-25 – 2022-05-27 (×2): 2 g via INTRAVENOUS

## 2022-05-25 MED ORDER — CLOPIDOGREL BISULFATE 75 MG PO TABS
75.0000 mg | ORAL_TABLET | Freq: Every day | ORAL | Status: DC
  Administered 2022-05-25 – 2022-06-06 (×13): 75 mg via ORAL
  Filled 2022-05-25 (×14): qty 1

## 2022-05-25 MED ORDER — ASPIRIN 81 MG PO CHEW
81.0000 mg | CHEWABLE_TABLET | Freq: Every day | ORAL | Status: DC
  Administered 2022-05-25 – 2022-06-06 (×14): 81 mg via ORAL
  Filled 2022-05-25 (×14): qty 1

## 2022-05-25 MED ORDER — GABAPENTIN 300 MG PO CAPS
300.0000 mg | ORAL_CAPSULE | Freq: Every day | ORAL | Status: DC
  Administered 2022-05-25 – 2022-06-06 (×14): 300 mg via ORAL
  Filled 2022-05-25 (×14): qty 1

## 2022-05-25 MED ORDER — SODIUM CHLORIDE 0.9% IV SOLUTION: Freq: Once | INTRAVENOUS | Status: DC

## 2022-05-25 NOTE — Consult Note (Signed)
[facility] Renal Consultation Note    Indication for Consultation:  Management of ESRD/hemodialysis; anemia, hypertension/volume and secondary hyperparathyroidism  HPI: [name]. is a 67 y.o. male with a past medical history significant for of ACD/ CHF/ NICM/ sp AICD, DM2, hx TIA's, HTN, PAD s/p bilateral BKA's, CVA, and ESRD (MWF at [facility]) who presented to [facility] ED on 05/24/22 via EMS with 1 week history of worsening weakness and fatigue.  He was contacted by his HD staff and informed him that he had an infection in his blood and encouraged him to go to the ED.  In the ED, he was afebrile, vital signs stable.  Labs were notable for WBC 18.5, Hgb 7.1, plt 129, Na 131, BUN 86, Cr 9.5, alb 2.3.  He was admitted and started on broad spectrum antibiotics.  We were consulted to provide dialysis during his hospitalization.  I contacted his home HD unit and his blood cultures were + for Staph Lugdunensis sensitive to cefazolin, cipro, clindamycin, and vancomycin.    Past Medical History:  Diagnosis Date   AICD (automatic cardioverter/defibrillator) present    Anemia    Arthritis    Cerebrovascular disease    CHF (congestive heart failure) (HCC)    Chronic kidney disease    ESRD, MWF HD   COVID 2022   moderate   Diabetes mellitus    type II   History of TIAs    Hyperlipidemia    Hypertension    Nonischemic cardiomyopathy (HCC)    PVD (peripheral vascular disease) (HCC)    s/p B BKA   Shortness of breath dyspnea    with exertion    Skin disease    Rare   Stroke ([place])    "light stroke"   Tobacco abuse    Past Surgical History:  Procedure Laterality Date   A/V FISTULAGRAM Left 06/24/2016   Procedure: A/V Fistulagram;  Surgeon: Elam Dutch, MD;  Location: [place] [facility];  Service: Cardiovascular;  Laterality: Left;   A/V FISTULAGRAM N/A 01/17/2017   Procedure: A/V Fistulagram - left arm;  Surgeon: Serafina Mitchell, MD;  Location: [place] [facility]  [facility];  Service: Cardiovascular;  Laterality: N/A;   AORTIC ARCH ANGIOGRAPHY N/A 10/21/2021   Procedure: AORTIC ARCH ANGIOGRAPHY;  Surgeon: Marty Heck, MD;  Location: [place] [facility];  Service: Cardiovascular;  Laterality: N/A;   AV FISTULA PLACEMENT Left 04/08/2015   Procedure: Creation of Left arm BRACHIOCEPHALIC ARTERIOVENOUS  FISTULA ;  Surgeon: Mal Misty, MD;  Location: [place];  Service: Vascular;  Laterality: Left;   AV FISTULA PLACEMENT Left 10/27/2020   Procedure: LEFT ARM ARTERIOVENOUS (AV) FISTULA CREATION;  Surgeon: Rosetta Posner, MD;  Location: [facility];  Service: Vascular;  Laterality: Left;   AV FISTULA PLACEMENT Left 06/29/2021   Procedure: INSERTION OF LEFT ARM ARTERIOVENOUS (AV) GORE-TEX GRAFT;  Surgeon: Rosetta Posner, MD;  Location: [facility];  Service: Vascular;  Laterality: Left;   [place] Left 01/12/2021   Procedure: LEFT ARM SECOND STAGE BASILIC VEIN TRANSPOSITION;  Surgeon: Rosetta Posner, MD;  Location: [facility];  Service: Vascular;  Laterality: Left;   CARDIAC DEFIBRILLATOR Powers EXTRACTION W/PHACO Right 03/17/2015   Procedure: CATARACT EXTRACTION PHACO AND INTRAOCULAR LENS PLACEMENT ([place]);  Surgeon: Rutherford Guys, MD;  Location: [facility];  Service: Ophthalmology;  Laterality: Right;  CDE: 6.59   EXCHANGE OF A DIALYSIS  CATHETER Right 02/08/2022   Procedure: EXCHANGE OF A TUNNELED DIALYSIS CATHETER;  Surgeon: Rosetta Posner, MD;  Location: [place];  Service: Vascular;  Laterality: Right;   EYE SURGERY Right    Cataract   IR FLUORO GUIDE CV LINE RIGHT  05/26/2021   IR PTA VENOUS EXCEPT DIALYSIS CIRCUIT  05/26/2021   LEG AMPUTATION BELOW KNEE     bilateral   LIGATION ARTERIOVENOUS GORTEX GRAFT Left 09/14/2021   Procedure: LIGATION OF LEFT ARM ARTERIOVENOUS GORTEX GRAFT;  Surgeon: Broadus John, MD;  Location: [place];  Service: Vascular;  Laterality: Left;  PERIPHERAL NERVE BLOCK   PERIPHERAL VASCULAR  INTERVENTION  01/17/2017   Procedure: PERIPHERAL VASCULAR INTERVENTION;  Surgeon: Serafina Mitchell, MD;  Location: [place] [facility];  Service: Cardiovascular;;  PTA  left arm fistula   UPPER EXTREMITY ANGIOGRAPHY N/A 10/21/2021   Procedure: Upper Extremity Angiography;  Surgeon: Marty Heck, MD;  Location: [place] [facility];  Service: Cardiovascular;  Laterality: N/A;   WOUND DEBRIDEMENT Left 02/18/2021   Procedure: LEFT ARM DEBRIDEMENT;  Surgeon: Serafina Mitchell, MD;  Location: [facility];  Service: Vascular;  Laterality: Left;   Family History:   Family History  Problem Relation Age of Onset   Diabetes Father    Stroke Father    Diabetes Brother    Diabetes Brother    Diabetes Brother    Diabetes Brother    Heart failure Mother    Diabetes Mother    Diabetes Other    Coronary artery disease Other    Social History:  reports that he quit smoking about 24 years ago. His smoking use included cigarettes. He has a 20.00 pack-year smoking history. He has never been exposed to tobacco smoke. He has never used smokeless tobacco. He reports that he does not currently use alcohol. He reports that he does not use drugs. Allergies  Allergen Reactions   Contrast Media [Iodinated Contrast Media] Hives and Other (See Comments)    Happened "in the 80's"   Other Other (See Comments)    "Transfer Dye" = "Makes me tired"   Acetazolamide Anxiety and Other (See Comments)    Jittery, odd feeling (hyper feeling)   Prior to Admission medications   Medication Sig Start Date End Date Taking? Authorizing Provider  amLODipine (NORVASC) 10 MG tablet Take 10 mg by mouth at bedtime. 05/09/17  Yes [provider]  aspirin 81 MG tablet Take 81 mg by mouth at bedtime.   Yes [provider]  brimonidine (ALPHAGAN) 0.2 % ophthalmic solution Place 1 drop into the right eye 3 (three) times daily.   Yes [provider]  calcium acetate (PHOSLO) 667 MG capsule Take 667-1,334 mg by  mouth See admin instructions. Take 1,334 mg by mouth three times a day with meals and 667 mg with each snack 03/25/15  Yes [provider]  carvedilol (COREG) 12.5 MG tablet take 1 tablet by mouth twice daily.  DO NOT TAKE IN THE MORNING ON DIALYSIS DAYS Patient taking differently: Take 12.5 mg by mouth See admin instructions. Take 12.5 mg by mouth two times a day and DO NOT TAKE IN THE MORNING ON DIALYSIS DAYS 09/29/20  Yes Evans Lance, MD  cloNIDine (CATAPRES) 0.1 MG tablet Take 1 tablet (0.1 mg total) by mouth at bedtime. Patient taking differently: Take 0.1 mg by mouth 2 (two) times daily. 06/28/16  Yes Domenic Polite, MD  clopidogrel (PLAVIX) 75 MG tablet Take 75 mg by mouth  at bedtime.   Yes [provider]  fluticasone (FLONASE) 50 MCG/ACT nasal spray Place 1 spray into both nostrils at bedtime as needed for allergies or rhinitis.   Yes [provider]  folic acid (FOLVITE) 1 MG tablet Take 1 mg by mouth at bedtime.   Yes [provider]  furosemide (LASIX) 20 MG tablet Take 60 mg by mouth 2 (two) times daily.   Yes [provider]  gabapentin (NEURONTIN) 300 MG capsule TAKE 1 CAPSULE(300 MG) BY MOUTH DAILY Patient taking differently: Take 300 mg by mouth at bedtime. 01/07/22  Yes [name], MD  hydrALAZINE (APRESOLINE) 25 MG tablet Take 1 tablet (25 mg total) by mouth in the morning and at bedtime. Patient taking differently: Take 25 mg by mouth See admin instructions. Taking twice daily except on Dialysis on Mon, Wed, Friday only take once daily after dialysis 05/27/21  Yes [name], DO  HYDROcodone-acetaminophen (NORCO/VICODIN) 5-325 MG tablet Take one tab po q 4 hrs prn pain 05/16/22  Yes Triplett, Tammy, PA-C  icosapent Ethyl (VASCEPA) 1 g capsule Take 2 g by mouth 2 (two) times daily. 04/29/22  Yes [provider]  insulin lispro (HUMALOG) 100 UNIT/ML KiwkPen Inject 15-20 Units into the skin 3 (three) times daily  before meals. 15 units on Dialysis days Mon, Wed, Friday   Yes [provider]  LANTUS SOLOSTAR 100 UNIT/ML Solostar Pen Inject 20 Units into the skin at bedtime. Patient taking differently: Inject 50 Units into the skin at bedtime. 05/27/21  Yes Vann, Jessica U, DO  rosuvastatin (CRESTOR) 20 MG tablet Take 20 mg by mouth at bedtime. 02/24/15  Yes [provider]  triamcinolone cream (KENALOG) 0.1 % Apply 1 Application topically 2 (two) times daily.   Yes [provider]  methocarbamol (ROBAXIN) 500 MG tablet Take 1 tablet (500 mg total) by mouth 3 (three) times daily. Patient not taking: Reported on 05/24/2022 05/16/22   Kem Parkinson, PA-C   Current Facility-Administered Medications  Medication Dose Route Frequency Provider Last Rate Last Admin   0.9 %  sodium chloride infusion (Manually program via Guardrails IV Fluids)   Intravenous Once Kc, Ramesh, MD       acetaminophen (TYLENOL) tablet 650 mg  650 mg Oral Q6H PRN Opyd, Ilene Qua, MD       Or   acetaminophen (TYLENOL) suppository 650 mg  650 mg Rectal Q6H PRN Opyd, Ilene Qua, MD       amLODipine (NORVASC) tablet 10 mg  10 mg Oral QHS Opyd, Ilene Qua, MD   10 mg at 05/25/22 0113   aspirin chewable tablet 81 mg  81 mg Oral QHS Opyd, Ilene Qua, MD   81 mg at 05/25/22 0114   brimonidine (ALPHAGAN) 0.2 % ophthalmic solution 1 drop  1 drop Right Eye TID Opyd, Ilene Qua, MD   1 drop at 05/25/22 1053   carvedilol (COREG) tablet 12.5 mg  12.5 mg Oral See admin instructions Opyd, Ilene Qua, MD       Chlorhexidine Gluconate Cloth 2 % PADS 6 each  6 each Topical Daily Kc, Ramesh, MD   6 each at 05/25/22 1053   cloNIDine (CATAPRES) tablet 0.1 mg  0.1 mg Oral QHS Opyd, Ilene Qua, MD   0.1 mg at 05/25/22 0114   clopidogrel (PLAVIX) tablet 75 mg  75 mg Oral QHS Vianne Bulls, MD   75 mg at [id number]   folic acid (FOLVITE) tablet 1 mg  1  mg Oral QHS Opyd, Ilene Qua, MD   1 mg at 05/25/22 0114   gabapentin (NEURONTIN)  capsule 300 mg  300 mg Oral QHS Opyd, Ilene Qua, MD   300 mg at 05/25/22 0114   heparin injection 5,000 Units  5,000 Units Subcutaneous Q8H Opyd, Ilene Qua, MD   5,000 Units at 05/25/22 [id number]   hydrALAZINE (APRESOLINE) tablet 25 mg  25 mg Oral See admin instructions Opyd, Ilene Qua, MD       insulin aspart (novoLOG) injection 0-5 Units  0-5 Units Subcutaneous QHS Opyd, Ilene Qua, MD       insulin aspart (novoLOG) injection 0-6 Units  0-6 Units Subcutaneous TID WC Opyd, Ilene Qua, MD       insulin aspart (novoLOG) injection 3 Units  3 Units Subcutaneous TID WC Opyd, Ilene Qua, MD   3 Units at 05/25/22 0830   insulin glargine-yfgn (SEMGLEE) injection 10 Units  10 Units Subcutaneous QHS Vianne Bulls, MD   10 Units at 05/25/22 0114   metroNIDAZOLE (FLAGYL) IVPB 500 mg  500 mg Intravenous Q12H Vianne Bulls, MD 100 mL/hr at 05/25/22 0704 500 mg at 05/25/22 0704   ondansetron (ZOFRAN) tablet 4 mg  4 mg Oral Q6H PRN Opyd, Ilene Qua, MD       Or   ondansetron (ZOFRAN) injection 4 mg  4 mg Intravenous Q6H PRN Opyd, Ilene Qua, MD       oxyCODONE (Oxy IR/ROXICODONE) immediate release tablet 5 mg  5 mg Oral Q4H PRN Opyd, Ilene Qua, MD       rosuvastatin (CRESTOR) tablet 20 mg  20 mg Oral QHS Opyd, Ilene Qua, MD   20 mg at 05/25/22 0114   sodium chloride flush (NS) 0.9 % injection 3 mL  3 mL Intravenous Q12H Opyd, Ilene Qua, MD   3 mL at 05/25/22 0114   vancomycin variable dose per unstable renal function (pharmacist dosing)   Does not apply See admin instructions Vianne Bulls, MD       Labs: Basic Metabolic Panel: Recent Labs  Lab 05/24/22 1725 05/25/22 0517  NA 131* 132*  K 4.0 4.1  CL 92* 95*  CO2 22 21*  GLUCOSE 140* 134*  BUN 86* 94*  CREATININE 9.50* 9.91*  CALCIUM 9.1 8.9  PHOS  --  6.4*   Liver Function Tests: Recent Labs  Lab 05/24/22 1725 05/25/22 0517  AST 47* 33  ALT 28 24  ALKPHOS 71 66  BILITOT 1.3* 1.3*  PROT 5.7* 5.5*  ALBUMIN 2.3* 2.1*   No results for input(s):  "LIPASE", "AMYLASE" in the last 168 hours. No results for input(s): "AMMONIA" in the last 168 hours. CBC: Recent Labs  Lab 05/24/22 1725 05/25/22 0517  WBC 18.5* 19.3*  NEUTROABS 16.0*  --   HGB 7.1* 7.1*  HCT 20.6* 21.4*  MCV 88.8 91.8  PLT 129* 130*   Cardiac Enzymes: No results for input(s): "CKTOTAL", "CKMB", "CKMBINDEX", "TROPONINI" in the last 168 hours. CBG: Recent Labs  Lab 05/25/22 0116 05/25/22 0733  GLUCAP 123* 116*   Iron Studies:  Recent Labs    05/25/22 0517  IRON 42*  TIBC NOT CALCULATED  FERRITIN 4,259*   Studies/Results: CT CHEST ABDOMEN PELVIS WO CONTRAST  Result Date: 05/24/2022 CLINICAL DATA:  Abdominal pain, acute, nonlocalized. 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 C/o generalized pain, esp on his bottom. Also s/o SOB EXAM: CT CHEST,  ABDOMEN AND PELVIS WITHOUT CONTRAST TECHNIQUE: Multidetector CT imaging of the chest, abdomen and pelvis was performed following the standard protocol without IV contrast. RADIATION DOSE REDUCTION: This exam was performed according to the departmental dose-optimization program which includes automated exposure control, adjustment of the mA and/or kV according to patient size and/or use of iterative reconstruction technique. COMPARISON:  None Available. FINDINGS: CT CHEST FINDINGS Cardiovascular: Left chest wall dual lead pacemaker and defibrillator in grossly appropriate position. Normal heart size. No significant pericardial effusion. The thoracic aorta is normal in caliber. Severe atherosclerotic plaque of the thoracic aorta. Four-vessel coronary artery calcifications. Aortic valve leaflet calcification. Cardiac findings suggestive of anemia. The main pulmonary artery is enlarged in caliber measuring up to 3.7 cm. Mediastinum/Nodes: No gross hilar adenopathy, noting limited sensitivity for the detection of hilar adenopathy  on this noncontrast study. No enlarged mediastinal, hilar, or axillary lymph nodes. Thyroid gland, trachea, and esophagus demonstrate no significant findings. Lungs/Pleura: Expiratory phase of respiration. Diffuse mild bronchial wall thickening. Bilateral lower lobe linear atelectasis versus scarring. No focal consolidation. No pulmonary nodule. No pulmonary mass. No pleural effusion. No pneumothorax. Musculoskeletal: Asymmetric right greater than left gynecomastia. No suspicious lytic or blastic osseous lesions. No acute displaced fracture. Multilevel degenerative changes of the spine. CT ABDOMEN PELVIS FINDINGS Hepatobiliary: No focal liver abnormality. Calcified 3.5 cm gallstone noted within the gallbladder lumen. No gallbladder wall thickening or pericholecystic fluid. No biliary dilatation. Pancreas: No focal lesion. Normal pancreatic contour. No surrounding inflammatory changes. No main pancreatic ductal dilatation. Spleen: Normal in size without focal abnormality. Adrenals/Urinary Tract: No adrenal nodule bilaterally. No nephrolithiasis and no hydronephrosis. Poorly visualized 2.2 cm left renal lesion with a density of 37 Hounsfield units (5:86, 2:65). Fluid density lesions within bilateral kidneys likely represent simple renal cysts. Simple renal cysts, in the absence of clinically indicated signs/symptoms, require no independent follow-up. Subcentimeter hypodensities are too small to characterize-no further follow-up indicated. No ureterolithiasis or hydroureter. The urinary bladder is unremarkable. Stomach/Bowel: Stomach is within normal limits. No evidence of bowel wall thickening or dilatation. Colonic diverticulosis. Appendix appears normal. Vascular/Lymphatic: No abdominal aorta or iliac aneurysm. Severe atherosclerotic plaque of the aorta and its branches. No abdominal, pelvic, or inguinal lymphadenopathy. Reproductive: Prostate is unremarkable. Other: No intraperitoneal free fluid. No intraperitoneal  free gas. No organized fluid collection. Musculoskeletal: Small fat containing umbilical hernia. Mild dermal thickening and subcutaneus soft tissue edema along the anterior abdominal wall (2:111). Mild diffuse subcutaneus soft tissue edema. No suspicious lytic or blastic osseous lesions. No acute displaced fracture. Multilevel degenerative changes of the spine. IMPRESSION: 1. Limited evaluation on this noncontrast study. 2. Mild dermal thickening and subcutaneus soft tissue edema along the anterior abdominal. No subcutaneus soft tissue emphysema. No organized fluid collection. Finding could represent cellulitis. Correlate with physical exam. 3. Enlarged main pulmonary artery suggestive of pulmonary hypertension. 4. Cholelithiasis with no CT finding of acute cholecystitis. 5. Colonic diverticulosis with no acute diverticulitis. 6. Indeterminate 2.2 cm left renal lesion. When the patient is clinically stable and able to follow directions and hold their breath (preferably as an outpatient) further evaluation with dedicated outpatient MRI renal protocol should be considered. 7. Aortic Atherosclerosis (ICD10-I70.0) including coronary artery and aortic valve leaflet calcifications-correlate for aortic stenosis. 8. Asymmetric right greater than left gynecomastia. Correlate with physical exam. Consider mammographic appointment for further evaluation as underlying malignancy cannot be excluded. Electronically Signed   By: Iven Finn M.D.   On: 05/24/2022 20:40   DG Chest 1 View  Result Date: 05/24/2022 CLINICAL DATA:  Shortness of breath EXAM: CHEST  1 VIEW COMPARISON:  02/08/2022 FINDINGS: Right IJ dialysis catheter tip: Cavoatrial junction. AICD noted, unchanged positioning. INSERT or chew Low lung volumes are present, causing crowding of the pulmonary vasculature. Linear bandlike opacity along the right hemidiaphragm peripherally. Indistinct density along the left peripheral hemidiaphragm could be from  pericardial adipose tissues or localized airspace opacity along the lingula, and is less striking than on the 02/08/2022 exam. IMPRESSION: 1. Indistinct density along the left peripheral hemidiaphragm could be from pericardial adipose tissues or localized airspace opacity along the lingula, and is less striking than on the 02/08/2022 exam. 2. Linear bandlike opacity along the right hemidiaphragm peripherally favors atelectasis. Electronically Signed   By: Van Clines M.D.   On: 05/24/2022 17:39    ROS: Pertinent items are noted in HPI. Physical Exam: Vitals:   05/25/22 0200 05/25/22 0300 05/25/22 0319 05/25/22 0753  BP:   (!) 160/67 (!) 126/91  Pulse:   73 76  Resp: 18  17 18  $ Temp:   97.8 F (36.6 C) 97.6 F (36.4 C)  TempSrc:   Axillary Axillary  SpO2:   100% 100%  Weight:  115.3 kg    Height:          Weight change:   Intake/Output Summary (Last 24 hours) at 05/25/2022 1124 Last data filed at 05/25/2022 0900 Gross per 24 hour  Intake 841.71 ml  Output --  Net 841.71 ml   BP (!) 126/91 (BP Location: Right Arm)   Pulse 76   Temp 97.6 F (36.4 C) (Axillary)   Resp 18   Ht 6' (1.829 m)   Wt 115.3 kg   SpO2 100%   BMI 34.47 kg/m  General appearance: no distress and slowed mentation Head: Normocephalic, without obvious abnormality, atraumatic Resp: clear to auscultation bilaterally Cardio: regular rate and rhythm, S1, S2 normal, no murmur, click, rub or gallop GI: soft, non-tender; bowel sounds normal; no masses,  no organomegaly Extremities: s/p bilateral BKA's, no edema Dialysis Access:  Dialysis Orders: Center: [facility]  on MWF . EDW 116.5kg HD Bath 2K/2.5Ca  Time 4 hours Heparin 2000 units IVP then 1000 units/hr. Access RIJ TDC BFR 400 DFR 500 UF profile 2      Assessment/Plan:  Staph Lugdunensis bacteremia - currently on maxipime, vancomycin, and flagyl.  Repeat blood cultures drawn in ED.  Usually skin and soft tissue infections.  Recommend ID  consult for abx guidance and to address the possible need for a line holiday.  ESRD -  Will plan for HD today.  Hypertension/volume  - stable  Anemia  - will dose ESA and transfuse as needed for Hgb <7  Metabolic bone disease -  continue with home meds  Nutrition - renal diet, carb modified.  Donetta Potts, MD [place] 05/25/2022, 11:24 AM

## 2022-05-25 NOTE — Hospital Course (Addendum)
57 yom w/ hypertension,IDDM,PAD,anemia, pacemaker, and ESRD on HD, sent from dialysis for evaluation of bacteremia.   Patient severely fatigued for the past 1 week, missed dialysis 2/20 for this reason, and was contacted by dialysis personnel, informed that he had an infection in his blood, and was directed to the ED. No fever, chills, cough, dysuria, abdominal pain, vomiting, diarrhea, neck stiffness, or new rash. In [id number] and saturating mid 90s on room air. CT C/A/P notable for mild skin thickening and edema of the abdominal wall without gas or abscess, left renal lesion, right greater than left gynecomastia, and other nonacute findings.   BUN 86, albumin 2.3, WBC 18,500, hemoglobin 7.1, and lactic acid 1.0. Blood cultures pending, started vancomycin/cefepime/Flagyl  Seen by nephrology underwent HD 2/21, blood transfusion for symptomatic anemia.  Blood culture was found to be Staph Lugdunensis bacteremia.  Infectious disease was consulted, continued on Ancef.  Repeat blood cultures are NTD. TTE with EF 45-50%, WMA.  Cardiology Is planning a TEE. Surgery was consulted and removed R IJ permcath.

## 2022-05-25 NOTE — Progress Notes (Signed)
Pharmacy Antibiotic Note  [name]. is a 67 y.o. male admitted on 05/24/2022 with bacteremia.  Pharmacy has been consulted for cefazolin dosing.  Plan: Cefazolin 2000 mg IV every HD. Monitor labs, c/s, and patient improvement.  Height: 6' (182.9 cm) Weight: 115.3 kg (254 lb 3.1 oz) IBW/kg (Calculated) : 77.6  Temp (24hrs), Avg:97.7 F (36.5 C), Min:97.6 F (36.4 C), Max:97.8 F (36.6 C)  Recent Labs  Lab 05/24/22 1725 05/24/22 1952 05/25/22 0517  WBC 18.5*  --  19.3*  CREATININE 9.50*  --  9.91*  LATICACIDVEN 1.0 0.9  --     Estimated Creatinine Clearance: 9.2 mL/min (A) (by C-G formula based on SCr of 9.91 mg/dL (H)).    Allergies  Allergen Reactions   Contrast Media [Iodinated Contrast Media] Hives and Other (See Comments)    Happened "in the 80's"   Other Other (See Comments)    "Transfer Dye" = "Makes me tired"   Acetazolamide Anxiety and Other (See Comments)    Jittery, odd feeling (hyper feeling)    Antimicrobials this admission: Cefazolin 2/21 >> Vanco/Cefepime/Flagyl 2/20 >>     Microbiology results: 2/21 BCx: ngtd Bcx from [place]:  + for Staph Lugdunensis sensitive to cefazolin, cipro, clindamycin, and vancomycin  2/21 MRSA PCR: neg  Thank you for allowing pharmacy to be a part of this patient's care.  Ramond Craver 05/25/2022 4:27 PM

## 2022-05-25 NOTE — H&P (Signed)
History and Physical    Godfrey Pick. [id number] DOB: 04-30-1955 DOA: 05/24/2022  PCP: [name], MD   Patient coming from: Home   Chief Complaint: Blood infection   HPI: Tyres Wierzba. is a 67 y.o. male with medical history significant for hypertension, insulin-dependent diabetes mellitus, PAD, anemia, and ESRD on hemodialysis who presents to the emergency department as directed by dialysis personnel for evaluation of infection.  Patient reports that he has been severely fatigued for the past 1 week, missed dialysis today for this reason, and was contacted by dialysis personnel, informed that he had an infection in his blood, and was directed to the ED.  Patient denies any fever, chills, cough, dysuria, abdominal pain, vomiting, diarrhea, neck stiffness, or new rash.  ED Course: Upon arrival to the ED, patient is found to be afebrile and saturating mid 90s on room air with normal heart rate and stable blood pressure.  CT of the chest, abdomen, and pelvis is notable for mild skin thickening and edema of the abdominal wall without gas or abscess, left renal lesion, right greater than left gynecomastia, and other nonacute findings.  Labs are notable for BUN 86, albumin 2.3, WBC 18,500, hemoglobin 7.1, and lactic acid 1.0.  Blood cultures were collected in the ED and the patient was treated with vancomycin, cefepime, Flagyl, and fentanyl.  Review of Systems:  All other systems reviewed and apart from HPI, are negative.  Past Medical History:  Diagnosis Date   AICD (automatic cardioverter/defibrillator) present    Anemia    Arthritis    Cerebrovascular disease    CHF (congestive heart failure) (HCC)    Chronic kidney disease    ESRD, MWF HD   COVID 2022   moderate   Diabetes mellitus    type II   History of TIAs    Hyperlipidemia    Hypertension    Nonischemic cardiomyopathy (HCC)    PVD (peripheral vascular disease) (HCC)    s/p B BKA   Shortness of breath dyspnea     with exertion    Skin disease    Rare   Stroke ([place])    "light stroke"   Tobacco abuse     Past Surgical History:  Procedure Laterality Date   A/V FISTULAGRAM Left 06/24/2016   Procedure: A/V Fistulagram;  Surgeon: Elam Dutch, MD;  Location: [facility];  Service: Cardiovascular;  Laterality: Left;   A/V FISTULAGRAM N/A 01/17/2017   Procedure: A/V Fistulagram - left arm;  Surgeon: Serafina Mitchell, MD;  Location: [facility];  Service: Cardiovascular;  Laterality: N/A;   AORTIC ARCH ANGIOGRAPHY N/A 10/21/2021   Procedure: AORTIC ARCH ANGIOGRAPHY;  Surgeon: Marty Heck, MD;  Location: [facility];  Service: Cardiovascular;  Laterality: N/A;   AV FISTULA PLACEMENT Left 04/08/2015   Procedure: Creation of Left arm BRACHIOCEPHALIC ARTERIOVENOUS  FISTULA ;  Surgeon: Mal Misty, MD;  Location: [facility];  Service: Vascular;  Laterality: Left;   AV FISTULA PLACEMENT Left 10/27/2020   Procedure: LEFT ARM ARTERIOVENOUS (AV) FISTULA CREATION;  Surgeon: Rosetta Posner, MD;  Location: [facility];  Service: Vascular;  Laterality: Left;   AV FISTULA PLACEMENT Left 06/29/2021   Procedure: INSERTION OF LEFT ARM ARTERIOVENOUS (AV) GORE-TEX GRAFT;  Surgeon: Rosetta Posner, MD;  Location: [facility];  Service: Vascular;  Laterality: Left;   [place] Left 01/12/2021   Procedure: LEFT ARM SECOND STAGE BASILIC VEIN TRANSPOSITION;  Surgeon:  Rosetta Posner, MD;  Location: [facility];  Service: Vascular;  Laterality: Left;   CARDIAC DEFIBRILLATOR PLACEMENT     CARDIAC DEFIBRILLATOR PLACEMENT     CATARACT EXTRACTION W/PHACO Right 03/17/2015   Procedure: CATARACT EXTRACTION PHACO AND INTRAOCULAR LENS PLACEMENT ([place]);  Surgeon: Rutherford Guys, MD;  Location: [facility];  Service: Ophthalmology;  Laterality: Right;  CDE: 6.59   EXCHANGE OF A DIALYSIS CATHETER Right 02/08/2022   Procedure: EXCHANGE OF A TUNNELED DIALYSIS CATHETER;  Surgeon: Rosetta Posner, MD;  Location: [facility];  Service:  Vascular;  Laterality: Right;   EYE SURGERY Right    Cataract   IR FLUORO GUIDE CV LINE RIGHT  05/26/2021   IR PTA VENOUS EXCEPT DIALYSIS CIRCUIT  05/26/2021   LEG AMPUTATION BELOW KNEE     bilateral   LIGATION ARTERIOVENOUS GORTEX GRAFT Left 09/14/2021   Procedure: LIGATION OF LEFT ARM ARTERIOVENOUS GORTEX GRAFT;  Surgeon: Broadus John, MD;  Location: [facility];  Service: Vascular;  Laterality: Left;  PERIPHERAL NERVE BLOCK   PERIPHERAL VASCULAR INTERVENTION  01/17/2017   Procedure: PERIPHERAL VASCULAR INTERVENTION;  Surgeon: Serafina Mitchell, MD;  Location: [facility];  Service: Cardiovascular;;  PTA  left arm fistula   UPPER EXTREMITY ANGIOGRAPHY N/A 10/21/2021   Procedure: Upper Extremity Angiography;  Surgeon: Marty Heck, MD;  Location: [facility];  Service: Cardiovascular;  Laterality: N/A;   WOUND DEBRIDEMENT Left 02/18/2021   Procedure: LEFT ARM DEBRIDEMENT;  Surgeon: Serafina Mitchell, MD;  Location[facility];  Service: Vascular;  Laterality: Left;    Social History:   reports that he quit smoking about 24 years ago. His smoking use included cigarettes. He has a 20.00 pack-year smoking history. He has never been exposed to tobacco smoke. He has never used smokeless tobacco. He reports that he does not currently use alcohol. He reports that he does not use drugs.  Allergies  Allergen Reactions   Contrast Media [Iodinated Contrast Media] Hives and Other (See Comments)    Happened "in the 64's"   Other Other (See Comments)    "Transfer Dye" = "Makes me tired"   Acetazolamide Anxiety and Other (See Comments)    Jittery, odd feeling (hyper feeling)    Family History  Problem Relation Age of Onset   Diabetes Father    Stroke Father    Diabetes Brother    Diabetes Brother    Diabetes Brother    Diabetes Brother    Heart failure Mother    Diabetes Mother    Diabetes Other    Coronary artery disease Other      Prior to Admission medications   Medication  Sig Start Date End Date Taking? Authorizing Provider  amLODipine (NORVASC) 10 MG tablet Take 10 mg by mouth at bedtime. 05/09/17  Yes [provider]  aspirin 81 MG tablet Take 81 mg by mouth at bedtime.   Yes [provider]  brimonidine (ALPHAGAN) 0.2 % ophthalmic solution Place 1 drop into the right eye 3 (three) times daily.   Yes [provider]  calcium acetate (PHOSLO) 667 MG capsule Take 667-1,334 mg by mouth See admin instructions. Take 1,334 mg by mouth three times a day with meals and 667 mg with each snack 03/25/15  Yes [provider]  carvedilol (COREG) 12.5 MG tablet take 1 tablet by mouth twice daily.  DO NOT TAKE IN THE MORNING ON DIALYSIS DAYS Patient taking differently: Take 12.5 mg by mouth See admin  instructions. Take 12.5 mg by mouth two times a day and DO NOT TAKE IN THE MORNING ON DIALYSIS DAYS 09/29/20  Yes Evans Lance, MD  cloNIDine (CATAPRES) 0.1 MG tablet Take 1 tablet (0.1 mg total) by mouth at bedtime. Patient taking differently: Take 0.1 mg by mouth 2 (two) times daily. 06/28/16  Yes Domenic Polite, MD  clopidogrel (PLAVIX) 75 MG tablet Take 75 mg by mouth at bedtime.   Yes [provider]  fluticasone (FLONASE) 50 MCG/ACT nasal spray Place 1 spray into both nostrils at bedtime as needed for allergies or rhinitis.   Yes [provider]  folic acid (FOLVITE) 1 MG tablet Take 1 mg by mouth at bedtime.   Yes [provider]  furosemide (LASIX) 20 MG tablet Take 60 mg by mouth 2 (two) times daily.   Yes [provider]  gabapentin (NEURONTIN) 300 MG capsule TAKE 1 CAPSULE(300 MG) BY MOUTH DAILY Patient taking differently: Take 300 mg by mouth at bedtime. 01/07/22  Yes [name], MD  hydrALAZINE (APRESOLINE) 25 MG tablet Take 1 tablet (25 mg total) by mouth in the morning and at bedtime. Patient taking differently: Take 25 mg by mouth See admin instructions. Taking twice daily except on  Dialysis on Mon, Wed, Friday only take once daily after dialysis 05/27/21  Yes [name], DO  HYDROcodone-acetaminophen (NORCO/VICODIN) 5-325 MG tablet Take one tab po q 4 hrs prn pain 05/16/22  Yes Triplett, Tammy, PA-C  icosapent Ethyl (VASCEPA) 1 g capsule Take 2 g by mouth 2 (two) times daily. 04/29/22  Yes [provider]  insulin lispro (HUMALOG) 100 UNIT/ML KiwkPen Inject 15-20 Units into the skin 3 (three) times daily before meals. 15 units on Dialysis days Mon, Wed, Friday   Yes [provider]  LANTUS SOLOSTAR 100 UNIT/ML Solostar Pen Inject 20 Units into the skin at bedtime. Patient taking differently: Inject 50 Units into the skin at bedtime. 05/27/21  Yes Vann, Jessica U, DO  rosuvastatin (CRESTOR) 20 MG tablet Take 20 mg by mouth at bedtime. 02/24/15  Yes [provider]  triamcinolone cream (KENALOG) 0.1 % Apply 1 Application topically 2 (two) times daily.   Yes [provider]  methocarbamol (ROBAXIN) 500 MG tablet Take 1 tablet (500 mg total) by mouth 3 (three) times daily. Patient not taking: Reported on 05/24/2022 05/16/22   Kem Parkinson, PA-C    Physical Exam: Vitals:   05/24/22 1602 05/24/22 1604 05/24/22 1959 05/24/22 2300  BP: (!) 138/95  (!) 131/99 (!) 167/77  Pulse: 80  79 78  Resp: 20  20 19  $ Temp: 98.1 F (36.7 C)     TempSrc: Oral     SpO2: 94%  100% 98%  Weight:  117.9 kg    Height:  6' (1.829 m)       Constitutional: NAD, no pallor or diaphoresis  Eyes: PERTLA, lids and conjunctivae normal ENMT: Mucous membranes are moist. Posterior pharynx clear of any exudate or lesions.   Neck: supple, no masses  Respiratory: no wheezing, no crackles. No accessory muscle use.  Cardiovascular: S1 & S2 heard, regular rate and rhythm. No extremity edema.   Abdomen: No distension, no tenderness, soft. Bowel sounds active.  Musculoskeletal: no clubbing / cyanosis. S/p b/l BKA.   Skin: no significant rashes, lesions, ulcers. Warm,  dry, well-perfused. Neurologic: CN 2-12 grossly intact. Moving all extremities. Alert and oriented.  Psychiatric: Calm. Cooperative.    Labs and Imaging on Admission: I have  personally reviewed following labs and imaging studies  CBC: Recent Labs  Lab 05/24/22 1725  WBC 18.5*  NEUTROABS 16.0*  HGB 7.1*  HCT 20.6*  MCV 88.8  PLT [id number]*   Basic Metabolic Panel: Recent Labs  Lab 05/24/22 1725  NA 131*  K 4.0  CL 92*  CO2 22  GLUCOSE 140*  BUN 86*  CREATININE 9.50*  CALCIUM 9.1  MG 2.1   GFR: Estimated Creatinine Clearance: 9.7 mL/min (A) (by C-G formula based on SCr of 9.5 mg/dL (H)). Liver Function Tests: Recent Labs  Lab 05/24/22 1725  AST 47*  ALT 28  ALKPHOS 71  BILITOT 1.3*  PROT 5.7*  ALBUMIN 2.3*   No results for input(s): "LIPASE", "AMYLASE" in the last 168 hours. No results for input(s): "AMMONIA" in the last 168 hours. Coagulation Profile: Recent Labs  Lab 05/24/22 1725  INR 1.3*   Cardiac Enzymes: No results for input(s): "CKTOTAL", "CKMB", "CKMBINDEX", "TROPONINI" in the last 168 hours. BNP (last 3 results) No results for input(s): "PROBNP" in the last 8760 hours. HbA1C: No results for input(s): "HGBA1C" in the last 72 hours. CBG: No results for input(s): "GLUCAP" in the last 168 hours. Lipid Profile: No results for input(s): "CHOL", "HDL", "LDLCALC", "TRIG", "CHOLHDL", "LDLDIRECT" in the last 72 hours. Thyroid Function Tests: No results for input(s): "TSH", "T4TOTAL", "FREET4", "T3FREE", "THYROIDAB" in the last 72 hours. Anemia Panel: No results for input(s): "VITAMINB12", "FOLATE", "FERRITIN", "TIBC", "IRON", "RETICCTPCT" in the last 72 hours. Urine analysis:    Component Value Date/Time   COLORURINE YELLOW 11/18/2010 0135   APPEARANCEUR CLEAR 11/18/2010 0135   LABSPEC 1.010 11/18/2010 0135   PHURINE 6.0 11/18/2010 0135   GLUCOSEU NEGATIVE 11/18/2010 0135   HGBUR NEGATIVE 11/18/2010 0135   BILIRUBINUR NEGATIVE 11/18/2010 0135    KETONESUR NEGATIVE 11/18/2010 0135   PROTEINUR NEGATIVE 11/18/2010 0135   UROBILINOGEN 0.2 11/18/2010 0135   NITRITE NEGATIVE 11/18/2010 0135   LEUKOCYTESUR NEGATIVE 11/18/2010 0135   Sepsis Labs: @LABRCNTIP$ (procalcitonin:4,lacticidven:4) ) Recent Results (from the past 240 hour(s))  Blood Culture (routine x 2)     Status: None (Preliminary result)   Collection Time: 05/24/22  5:25 PM   Specimen: Right Antecubital; Blood  Result Value Ref Range Status   Specimen Description RIGHT ANTECUBITAL  Final   Special Requests   Final    BOTTLES DRAWN AEROBIC AND ANAEROBIC Blood Culture adequate volume Performed at [facility], [address], [place], [place] [address]    Culture PENDING  Incomplete   Report Status PENDING  Incomplete  Blood Culture (routine x 2)     Status: None (Preliminary result)   Collection Time: 05/24/22  5:25 PM   Specimen: Right Antecubital; Blood  Result Value Ref Range Status   Specimen Description RIGHT ANTECUBITAL  Final   Special Requests   Final    BOTTLES DRAWN AEROBIC AND ANAEROBIC Blood Culture adequate volume Performed at [facility], [address] [place], [place] [address]    Culture PENDING  Incomplete   Report Status PENDING  Incomplete     Radiological Exams on Admission: CT CHEST ABDOMEN PELVIS WO CONTRAST  Result Date: 05/24/2022 CLINICAL DATA:  Abdominal pain, acute, nonlocalized. 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 C/o generalized pain, esp on his bottom. Also s/o SOB EXAM: CT CHEST, ABDOMEN AND PELVIS WITHOUT CONTRAST TECHNIQUE: Multidetector CT imaging of the chest, abdomen and pelvis was performed following the standard  protocol without IV contrast. RADIATION DOSE REDUCTION: This exam was performed according to the departmental dose-optimization program which includes automated exposure control, adjustment of the mA and/or kV  according to patient size and/or use of iterative reconstruction technique. COMPARISON:  None Available. FINDINGS: CT CHEST FINDINGS Cardiovascular: Left chest wall dual lead pacemaker and defibrillator in grossly appropriate position. Normal heart size. No significant pericardial effusion. The thoracic aorta is normal in caliber. Severe atherosclerotic plaque of the thoracic aorta. Four-vessel coronary artery calcifications. Aortic valve leaflet calcification. Cardiac findings suggestive of anemia. The main pulmonary artery is enlarged in caliber measuring up to 3.7 cm. Mediastinum/Nodes: No gross hilar adenopathy, noting limited sensitivity for the detection of hilar adenopathy on this noncontrast study. No enlarged mediastinal, hilar, or axillary lymph nodes. Thyroid gland, trachea, and esophagus demonstrate no significant findings. Lungs/Pleura: Expiratory phase of respiration. Diffuse mild bronchial wall thickening. Bilateral lower lobe linear atelectasis versus scarring. No focal consolidation. No pulmonary nodule. No pulmonary mass. No pleural effusion. No pneumothorax. Musculoskeletal: Asymmetric right greater than left gynecomastia. No suspicious lytic or blastic osseous lesions. No acute displaced fracture. Multilevel degenerative changes of the spine. CT ABDOMEN PELVIS FINDINGS Hepatobiliary: No focal liver abnormality. Calcified 3.5 cm gallstone noted within the gallbladder lumen. No gallbladder wall thickening or pericholecystic fluid. No biliary dilatation. Pancreas: No focal lesion. Normal pancreatic contour. No surrounding inflammatory changes. No main pancreatic ductal dilatation. Spleen: Normal in size without focal abnormality. Adrenals/Urinary Tract: No adrenal nodule bilaterally. No nephrolithiasis and no hydronephrosis. Poorly visualized 2.2 cm left renal lesion with a density of 37 Hounsfield units (5:86, 2:65). Fluid density lesions within bilateral kidneys likely represent simple renal  cysts. Simple renal cysts, in the absence of clinically indicated signs/symptoms, require no independent follow-up. Subcentimeter hypodensities are too small to characterize-no further follow-up indicated. No ureterolithiasis or hydroureter. The urinary bladder is unremarkable. Stomach/Bowel: Stomach is within normal limits. No evidence of bowel wall thickening or dilatation. Colonic diverticulosis. Appendix appears normal. Vascular/Lymphatic: No abdominal aorta or iliac aneurysm. Severe atherosclerotic plaque of the aorta and its branches. No abdominal, pelvic, or inguinal lymphadenopathy. Reproductive: Prostate is unremarkable. Other: No intraperitoneal free fluid. No intraperitoneal free gas. No organized fluid collection. Musculoskeletal: Small fat containing umbilical hernia. Mild dermal thickening and subcutaneus soft tissue edema along the anterior abdominal wall (2:111). Mild diffuse subcutaneus soft tissue edema. No suspicious lytic or blastic osseous lesions. No acute displaced fracture. Multilevel degenerative changes of the spine. IMPRESSION: 1. Limited evaluation on this noncontrast study. 2. Mild dermal thickening and subcutaneus soft tissue edema along the anterior abdominal. No subcutaneus soft tissue emphysema. No organized fluid collection. Finding could represent cellulitis. Correlate with physical exam. 3. Enlarged main pulmonary artery suggestive of pulmonary hypertension. 4. Cholelithiasis with no CT finding of acute cholecystitis. 5. Colonic diverticulosis with no acute diverticulitis. 6. Indeterminate 2.2 cm left renal lesion. When the patient is clinically stable and able to follow directions and hold their breath (preferably as an outpatient) further evaluation with dedicated outpatient MRI renal protocol should be considered. 7. Aortic Atherosclerosis (ICD10-I70.0) including coronary artery and aortic valve leaflet calcifications-correlate for aortic stenosis. 8. Asymmetric right greater  than left gynecomastia. Correlate with physical exam. Consider mammographic appointment for further evaluation as underlying malignancy cannot be excluded. Electronically Signed   By: Iven Finn M.D.   On: 05/24/2022 20:40   DG Chest 1 View  Result Date: 05/24/2022 CLINICAL DATA:  Shortness of breath EXAM: CHEST  1 VIEW COMPARISON:  02/08/2022 FINDINGS: Right  IJ dialysis catheter tip: Cavoatrial junction. AICD noted, unchanged positioning. INSERT or chew Low lung volumes are present, causing crowding of the pulmonary vasculature. Linear bandlike opacity along the right hemidiaphragm peripherally. Indistinct density along the left peripheral hemidiaphragm could be from pericardial adipose tissues or localized airspace opacity along the lingula, and is less striking than on the 02/08/2022 exam. IMPRESSION: 1. Indistinct density along the left peripheral hemidiaphragm could be from pericardial adipose tissues or localized airspace opacity along the lingula, and is less striking than on the 02/08/2022 exam. 2. Linear bandlike opacity along the right hemidiaphragm peripherally favors atelectasis. Electronically Signed   By: Van Clines M.D.   On: 05/24/2022 17:39     Assessment/Plan   1. ?bacteremia  - Patient reports that he was informed by dialysis personnel that he has a blood infection and should go to the hospital  - He has leukocytosis in ED but no fever or other SIRS criteria; his only complaint is severe fatigue x1 week  - No obvious source; he has a dialysis catheter in right chest  - Blood cultures collected in ED and broad-spectrum antibiotics started  - Continue broad-spectrum antibiotics, follow cultures and clinical course    2. ESRD  - Missed HD 2/20 d/t fatigue  - Restrict fluids, renally-dose medications, consult nephrology in am for dialysis    3. Hypertension  - Continue Norvasc, Coreg, clonidine, hydralazine    4. Anemia  - Hgb is 7.1 on admission without clinical  bleeding  - Check anemia panel, monitor   5. Insulin-dependent DM  - Check CBGs and continue insulin   6. Left renal lesion  - Noted incidentally on CT in ED  - Outpatient MRI recommended    7. Gynecomastia  - Rt > Lt gynecomastia noted on CT in ED  - Outpatient follow-up and consideration of mammography recommended    DVT prophylaxis: sq heparin  Code Status: Full  Level of Care: Level of care: Telemetry Family Communication: none present  Disposition Plan:  Patient is from: home  Anticipated d/c is to: TBD Anticipated d/c date is: 05/26/22  Patient currently: Pending cultures  Consults called: none  Admission status: observation     Vianne Bulls, MD [facility]  05/25/2022, 12:48 AM

## 2022-05-25 NOTE — Progress Notes (Signed)
  Transition of Care [facility]) Screening Note   Patient Details  Name: [name]. Date of Birth: 1955-12-12   Transition of Care [facility]) CM/SW Contact:    Ihor Gully, LCSW Phone Number: 05/25/2022, 10:56 AM    Transition of Care Department [facility]) has reviewed patient and no TOC needs have been identified at this time. We will continue to monitor patient advancement through interdisciplinary progression rounds. If new patient transition needs arise, please place a TOC consult.

## 2022-05-25 NOTE — Progress Notes (Signed)
PROGRESS NOTE [name].  [id number] DOB: [birth year] DOA: 05/24/2022 PCP: [name], MD   Brief Narrative/Hospital Course: 68 yom w/ hypertension,IDDM,PAD,anemia, and ESRD on hd sent from dialysis for evaluation of infection.   Patient reports that he has been severely fatigued for the past 1 week, missed dialysis 2/20 for this reason, and was contacted by dialysis personnel, informed that he had an infection in his blood, and was directed to the ED. He denies any fever, chills, cough, dysuria, abdominal pain, vomiting, diarrhea, neck stiffness, or new rash. In [id number] and saturating mid 90s on room air with normal heart rate and stable blood pressure.CT of the chest, abdomen, and pelvis is notable for mild skin thickening and edema of the abdominal wall without gas or abscess, left renal lesion, right greater than left gynecomastia, and other nonacute findings.  Labs are notable for BUN 86, albumin 2.3, WBC 18,500, hemoglobin 7.1, and lactic acid 1.0. Blood culture were collected placed on vancomycin /cefepime, Flagyl and admitted for further management    Subjective: Seen this am About to have his meal, Feels some better He is sitting on the bed, s bilateral BKA status  Assessment and Plan: Principal Problem:   Bacteremia Active Problems:   Essential hypertension   DM (diabetes mellitus), type 2 with peripheral vascular complications (HCC)   Anemia   ESRD (end stage renal disease) on dialysis ([facility])   Kidney lesion, native, left   Gynecomastia   Fatigue x 1 week ?Bacteremia at outpatient HD Leukocytosis: Sent by HD to the ED due to concern for blood infection.  Goes to [facility]- had Blood cx done on 05/19/22 per him<Labs with leukocytosis otherwise blood pressure stable.  Consulted nephrology- we will follow-up w/ [facility] on the blood culture report.  He has a dialysis catheter in his right chest, blood cultures sent in the ED, on broad-spectrum antibiotics.  ESRD on HD  MWF: missed HD 2/20 will consult nephrology.  Bicarb and potassium appears stable Hypertension: BP fairly controlled, on amlodipine 10, Coreg 12.5, clonidine and hydralazine. HLD on Crestor.  Anemia of CKD hemoglobin borderline at 7.1 g: Appears fatigued weak symptomatic.  Patient baseline hemoglobin 10 to 12 g, will benefit with 1 unit PRBC - he agress after explaining risks and benefits. Will do with HD.  Check anemia panel. Recent Labs  Lab 05/24/22 1725 05/25/22 0517  HGB 7.1* 7.1*  HCT 20.6* 21.4*     Insulin-dependent diabetes mellitus continue SSI Recent Labs  Lab 05/25/22 0116 05/25/22 0517 05/25/22 0733  GLUCAP 123*  --  116*  HGBA1C  --  5.6  --     Left renal lesion outpatient MRI recommended incidentally noted in the CT Gynecomastia right more than left incidental noted on the CT outpatient follow-up and consideration for mammogram is recommended  Class I Obesity:Patient's Body mass index is 34.47 kg/m. : Will benefit with PCP follow-up, weight loss  healthy lifestyle and outpatient sleep evaluation.   DVT prophylaxis: heparin injection 5,000 Units Start: 05/25/22 0600 Code Status:   Code Status: Full Code Family Communication: plan of care discussed with patient at bedside. Patient status is: admitted as observation but remains hospitalized for ongoing  because of bacteremia Level of care: Telemetry  Dispo: The patient is from: home lives alone            Anticipated disposition: TBD in  Objective: Vitals last 24 hrs: Vitals:   05/25/22 0200 05/25/22 0300 05/25/22 0319 05/25/22 0753  BP:   Marland Kitchen)  160/67 (!) 126/91  Pulse:   73 76  Resp: 18  17 18  $ Temp:   97.8 F (36.6 C) 97.6 F (36.4 C)  TempSrc:   Axillary Axillary  SpO2:   100% 100%  Weight:  115.3 kg    Height:       Weight change:   Physical Examination: General exam: alert awake,oriented x3, older than stated age HEENT:Oral mucosa moist, Ear/Nose WNL grossly Respiratory system: bilaterally  clear BS, no use of accessory muscle Cardiovascular system: S1 & S2 +, No JVD. Gastrointestinal system: Abdomen soft,NT,ND, BS+ Nervous System:Alert, awake, moving extremities. Extremities: LE edema neg b/k BKA Skin: No rashes,no icterus. MSK: Normal muscle bulk,tone, power Rt chest wall w/ HD catheter  Medications reviewed:  Scheduled Meds:  sodium chloride   Intravenous Once   amLODipine  10 mg Oral QHS   aspirin  81 mg Oral QHS   brimonidine  1 drop Right Eye TID   carvedilol  12.5 mg Oral See admin instructions   Chlorhexidine Gluconate Cloth  6 each Topical Daily   cloNIDine  0.1 mg Oral QHS   clopidogrel  75 mg Oral QHS   folic acid  1 mg Oral QHS   gabapentin  300 mg Oral QHS   heparin  5,000 Units Subcutaneous Q8H   hydrALAZINE  25 mg Oral See admin instructions   insulin aspart  0-5 Units Subcutaneous QHS   insulin aspart  0-6 Units Subcutaneous TID WC   insulin aspart  3 Units Subcutaneous TID WC   insulin glargine-yfgn  10 Units Subcutaneous QHS   rosuvastatin  20 mg Oral QHS   sodium chloride flush  3 mL Intravenous Q12H   vancomycin variable dose per unstable renal function (pharmacist dosing)   Does not apply See admin instructions   Continuous Infusions:  metronidazole 500 mg (05/25/22 0704)    Diet Order             Diet renal with fluid restriction Fluid restriction: 1200 mL Fluid; Room service appropriate? Yes; Fluid consistency: Thin  Diet effective now                  Intake/Output Summary (Last 24 hours) at 05/25/2022 1016 Last data filed at 05/25/2022 0900 Gross per 24 hour  Intake 841.71 ml  Output --  Net 841.71 ml   Net IO Since Admission: 841.71 mL [05/25/22 1016]  Wt Readings from Last 3 Encounters:  05/25/22 115.3 kg  05/16/22 117.9 kg  02/08/22 117.9 kg     Unresulted Labs (From admission, onward)     Start     Ordered   05/25/22 [id number]  Basic metabolic panel  Daily,   R      05/25/22 0019   05/25/22 0500  CBC  Daily,   R       05/25/22 0019          Data Reviewed: I have personally reviewed following labs and imaging studies CBC: Recent Labs  Lab 05/24/22 1725 05/25/22 0517  WBC 18.5* 19.3*  NEUTROABS 16.0*  --   HGB 7.1* 7.1*  HCT 20.6* 21.4*  MCV 88.8 91.8  PLT 129* [id number]*   Basic Metabolic Panel: Recent Labs  Lab 05/24/22 1725 05/25/22 0517  NA 131* 132*  K 4.0 4.1  CL 92* 95*  CO2 22 21*  GLUCOSE 140* 134*  BUN 86* 94*  CREATININE 9.50* 9.91*  CALCIUM 9.1 8.9  MG 2.1  --   PHOS  --  6.4*   GFR: Estimated Creatinine Clearance: 9.2 mL/min (A) (by C-G formula based on SCr of 9.91 mg/dL (H)). Liver Function Tests: Recent Labs  Lab 05/24/22 1725 05/25/22 0517  AST 47* 33  ALT 28 24  ALKPHOS 71 66  BILITOT 1.3* 1.3*  PROT 5.7* 5.5*  ALBUMIN 2.3* 2.1*   Recent Labs  Lab 05/24/22 1725  INR 1.3*   Recent Labs  Lab 05/25/22 0116 05/25/22 0733  GLUCAP 123* 116*   Recent Labs  Lab 05/24/22 1725 05/24/22 1952  LATICACIDVEN 1.0 0.9    Recent Results (from the past 240 hour(s))  Blood Culture (routine x 2)     Status: None (Preliminary result)   Collection Time: 05/24/22  5:25 PM   Specimen: Right Antecubital; Blood  Result Value Ref Range Status   Specimen Description RIGHT ANTECUBITAL  Final   Special Requests   Final    BOTTLES DRAWN AEROBIC AND ANAEROBIC Blood Culture adequate volume   Culture   Final    NO GROWTH < 24 HOURS Performed at [facility], [address] [place], [place] [address]    Report Status PENDING  Incomplete  Blood Culture (routine x 2)     Status: None (Preliminary result)   Collection Time: 05/24/22  5:25 PM   Specimen: Right Antecubital; Blood  Result Value Ref Range Status   Specimen Description RIGHT ANTECUBITAL  Final   Special Requests   Final    BOTTLES DRAWN AEROBIC AND ANAEROBIC Blood Culture adequate volume   Culture   Final    NO GROWTH < 24 HOURS Performed at [facility], [address], [place],  [address]    Report  Status PENDING  Incomplete  MRSA Next Gen by PCR, Nasal     Status: None   Collection Time: 05/25/22  3:27 AM   Specimen: Nasal Mucosa; Nasal Swab  Result Value Ref Range Status   MRSA by PCR Next Gen NOT DETECTED NOT DETECTED Final    Comment: (NOTE) The GeneXpert MRSA Assay (FDA approved for NASAL specimens only), is one component of a comprehensive MRSA colonization surveillance program. It is not intended to diagnose MRSA infection nor to guide or monitor treatment for MRSA infections. Test performance is not FDA approved in patients less than 58 years old. Performed at [facility], [address] [place],  [address]     Antimicrobials: Anti-infectives (From admission, onward)    Start     Dose/Rate Route Frequency Ordered Stop   05/25/22 0745  metroNIDAZOLE (FLAGYL) IVPB 500 mg        500 mg 100 mL/hr over 60 Minutes Intravenous Every 12 hours 05/25/22 0019 06/01/22 0744   05/24/22 2102  vancomycin variable dose per unstable renal function (pharmacist dosing)         Does not apply See admin instructions 05/24/22 2103     05/24/22 2000  vancomycin (VANCOREADY) IVPB 2000 mg/400 mL        2,000 mg 200 mL/hr over 120 Minutes Intravenous  Once 05/24/22 1948 05/25/22 0128   05/24/22 1945  ceFEPIme (MAXIPIME) 2 g in sodium chloride 0.9 % 100 mL IVPB        2 g 200 mL/hr over 30 Minutes Intravenous  Once 05/24/22 1936 05/24/22 2053   05/24/22 1945  metroNIDAZOLE (FLAGYL) IVPB 500 mg        500 mg 100 mL/hr over 60 Minutes Intravenous  Once 05/24/22 1936 05/24/22 2246   05/24/22 1945  vancomycin (VANCOCIN) IVPB  1000 mg/200 mL premix  Status:  Discontinued        1,000 mg 200 mL/hr over 60 Minutes Intravenous  Once 05/24/22 1936 05/24/22 1948      Culture/Microbiology    Component Value Date/Time   SDES RIGHT ANTECUBITAL 05/24/2022 1725   SDES RIGHT ANTECUBITAL 05/24/2022 1725   SPECREQUEST  05/24/2022 1725    BOTTLES DRAWN AEROBIC AND ANAEROBIC Blood Culture adequate  volume   SPECREQUEST  05/24/2022 1725    BOTTLES DRAWN AEROBIC AND ANAEROBIC Blood Culture adequate volume   CULT  05/24/2022 1725    NO GROWTH < 24 HOURS Performed at [facility], [address], [place], [place] [address]    CULT  05/24/2022 1725    NO GROWTH < 24 HOURS Performed at [facility], [address] [place], [place] [address]    REPTSTATUS PENDING 05/24/2022 1725   REPTSTATUS PENDING 05/24/2022 1725  Radiology Studies: CT CHEST ABDOMEN PELVIS WO CONTRAST  Result Date: 05/24/2022 CLINICAL DATA:  Abdominal pain, acute, nonlocalized. 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 C/o generalized pain, esp on his bottom. Also s/o SOB EXAM: CT CHEST, ABDOMEN AND PELVIS WITHOUT CONTRAST TECHNIQUE: Multidetector CT imaging of the chest, abdomen and pelvis was performed following the standard protocol without IV contrast. RADIATION DOSE REDUCTION: This exam was performed according to the departmental dose-optimization program which includes automated exposure control, adjustment of the mA and/or kV according to patient size and/or use of iterative reconstruction technique. COMPARISON:  None Available. FINDINGS: CT CHEST FINDINGS Cardiovascular: Left chest wall dual lead pacemaker and defibrillator in grossly appropriate position. Normal heart size. No significant pericardial effusion. The thoracic aorta is normal in caliber. Severe atherosclerotic plaque of the thoracic aorta. Four-vessel coronary artery calcifications. Aortic valve leaflet calcification. Cardiac findings suggestive of anemia. The main pulmonary artery is enlarged in caliber measuring up to 3.7 cm. Mediastinum/Nodes: No gross hilar adenopathy, noting limited sensitivity for the detection of hilar adenopathy on this noncontrast study. No enlarged mediastinal, hilar, or axillary lymph nodes. Thyroid gland, trachea, and esophagus  demonstrate no significant findings. Lungs/Pleura: Expiratory phase of respiration. Diffuse mild bronchial wall thickening. Bilateral lower lobe linear atelectasis versus scarring. No focal consolidation. No pulmonary nodule. No pulmonary mass. No pleural effusion. No pneumothorax. Musculoskeletal: Asymmetric right greater than left gynecomastia. No suspicious lytic or blastic osseous lesions. No acute displaced fracture. Multilevel degenerative changes of the spine. CT ABDOMEN PELVIS FINDINGS Hepatobiliary: No focal liver abnormality. Calcified 3.5 cm gallstone noted within the gallbladder lumen. No gallbladder wall thickening or pericholecystic fluid. No biliary dilatation. Pancreas: No focal lesion. Normal pancreatic contour. No surrounding inflammatory changes. No main pancreatic ductal dilatation. Spleen: Normal in size without focal abnormality. Adrenals/Urinary Tract: No adrenal nodule bilaterally. No nephrolithiasis and no hydronephrosis. Poorly visualized 2.2 cm left renal lesion with a density of 37 Hounsfield units (5:86, 2:65). Fluid density lesions within bilateral kidneys likely represent simple renal cysts. Simple renal cysts, in the absence of clinically indicated signs/symptoms, require no independent follow-up. Subcentimeter hypodensities are too small to characterize-no further follow-up indicated. No ureterolithiasis or hydroureter. The urinary bladder is unremarkable. Stomach/Bowel: Stomach is within normal limits. No evidence of bowel wall thickening or dilatation. Colonic diverticulosis. Appendix appears normal. Vascular/Lymphatic: No abdominal aorta or iliac aneurysm. Severe atherosclerotic plaque of the aorta and its branches. No abdominal, pelvic, or inguinal lymphadenopathy. Reproductive: Prostate is unremarkable. Other: No intraperitoneal free fluid. No  intraperitoneal free gas. No organized fluid collection. Musculoskeletal: Small fat containing umbilical hernia. Mild dermal thickening  and subcutaneus soft tissue edema along the anterior abdominal wall (2:111). Mild diffuse subcutaneus soft tissue edema. No suspicious lytic or blastic osseous lesions. No acute displaced fracture. Multilevel degenerative changes of the spine. IMPRESSION: 1. Limited evaluation on this noncontrast study. 2. Mild dermal thickening and subcutaneus soft tissue edema along the anterior abdominal. No subcutaneus soft tissue emphysema. No organized fluid collection. Finding could represent cellulitis. Correlate with physical exam. 3. Enlarged main pulmonary artery suggestive of pulmonary hypertension. 4. Cholelithiasis with no CT finding of acute cholecystitis. 5. Colonic diverticulosis with no acute diverticulitis. 6. Indeterminate 2.2 cm left renal lesion. When the patient is clinically stable and able to follow directions and hold their breath (preferably as an outpatient) further evaluation with dedicated outpatient MRI renal protocol should be considered. 7. Aortic Atherosclerosis (ICD10-I70.0) including coronary artery and aortic valve leaflet calcifications-correlate for aortic stenosis. 8. Asymmetric right greater than left gynecomastia. Correlate with physical exam. Consider mammographic appointment for further evaluation as underlying malignancy cannot be excluded. Electronically Signed   By: Iven Finn M.D.   On: 05/24/2022 20:40   DG Chest 1 View  Result Date: 05/24/2022 CLINICAL DATA:  Shortness of breath EXAM: CHEST  1 VIEW COMPARISON:  02/08/2022 FINDINGS: Right IJ dialysis catheter tip: Cavoatrial junction. AICD noted, unchanged positioning. INSERT or chew Low lung volumes are present, causing crowding of the pulmonary vasculature. Linear bandlike opacity along the right hemidiaphragm peripherally. Indistinct density along the left peripheral hemidiaphragm could be from pericardial adipose tissues or localized airspace opacity along the lingula, and is less striking than on the 02/08/2022 exam.  IMPRESSION: 1. Indistinct density along the left peripheral hemidiaphragm could be from pericardial adipose tissues or localized airspace opacity along the lingula, and is less striking than on the 02/08/2022 exam. 2. Linear bandlike opacity along the right hemidiaphragm peripherally favors atelectasis. Electronically Signed   By: Van Clines M.D.   On: 05/24/2022 17:39     LOS: 0 days   Antonieta Pert, MD [facility]  05/25/2022, 10:16 AM

## 2022-05-26 DIAGNOSIS — R7881 Bacteremia: Secondary | ICD-10-CM | POA: Diagnosis not present

## 2022-05-26 LAB — BASIC METABOLIC PANEL
Anion gap: 14 (ref 5–15)
BUN: 56 mg/dL — ABNORMAL HIGH (ref 8–23)
CO2: 23 mmol/L (ref 22–32)
Calcium: 8.6 mg/dL — ABNORMAL LOW (ref 8.9–10.3)
Chloride: 95 mmol/L — ABNORMAL LOW (ref 98–111)
Creatinine, Ser: 6.44 mg/dL — ABNORMAL HIGH (ref 0.61–1.24)
GFR, Estimated: 9 mL/min — ABNORMAL LOW (ref >60)
Glucose, Bld: 156 mg/dL — ABNORMAL HIGH (ref 70–99)
Potassium: 3.8 mmol/L (ref 3.5–5.1)
Sodium: 132 mmol/L — ABNORMAL LOW (ref 135–145)

## 2022-05-26 LAB — TYPE AND SCREEN
ABO/RH(D): B POS
Antibody Screen: NEGATIVE
Unit division: 0

## 2022-05-26 LAB — CBC
HCT: 23.9 % — ABNORMAL LOW (ref 39.0–52.0)
Hemoglobin: 8.1 g/dL — ABNORMAL LOW (ref 13.0–17.0)
MCH: 30.3 pg (ref 26.0–34.0)
MCHC: 33.9 g/dL (ref 30.0–36.0)
MCV: 89.5 fL (ref 80.0–100.0)
Platelets: 109 10*3/uL — ABNORMAL LOW (ref 150–400)
RBC: 2.67 MIL/uL — ABNORMAL LOW (ref 4.22–5.81)
RDW: 16.9 % — ABNORMAL HIGH (ref 11.5–15.5)
WBC: 15.6 10*3/uL — ABNORMAL HIGH (ref 4.0–10.5)
nRBC: 0 % (ref 0.0–0.2)

## 2022-05-26 LAB — BPAM RBC
Blood Product Expiration Date: 202403272359
ISSUE DATE / TIME: 202402212103
Unit Type and Rh: 1700

## 2022-05-26 LAB — GLUCOSE, CAPILLARY
Glucose-Capillary: 106 mg/dL — ABNORMAL HIGH (ref 70–99)
Glucose-Capillary: 114 mg/dL — ABNORMAL HIGH (ref 70–99)
Glucose-Capillary: 137 mg/dL — ABNORMAL HIGH (ref 70–99)
Glucose-Capillary: 176 mg/dL — ABNORMAL HIGH (ref 70–99)
Glucose-Capillary: 185 mg/dL — ABNORMAL HIGH (ref 70–99)

## 2022-05-26 NOTE — Progress Notes (Signed)
PROGRESS NOTE [name].  AV:[id number] DOB: 10-26-1955 DOA: 05/24/2022 PCP: [name], MD   Brief Narrative/Hospital Course: 89 yom w/ hypertension,IDDM,PAD,anemia, and ESRD on hd sent from dialysis for evaluation of infection.   Patient reports that he has been severely fatigued for the past 1 week, missed dialysis 2/20 for this reason, and was contacted by dialysis personnel, informed that he had an infection in his blood, and was directed to the ED. He denies any fever, chills, cough, dysuria, abdominal pain, vomiting, diarrhea, neck stiffness, or new rash. In [id number] and saturating mid 90s on room air with normal heart rate and stable blood pressure.CT of the chest, abdomen, and pelvis is notable for mild skin thickening and edema of the abdominal wall without gas or abscess, left renal lesion, right greater than left gynecomastia, and other nonacute findings.  Labs are notable for BUN 86, albumin 2.3, WBC 18,500, hemoglobin 7.1, and lactic acid 1.0. Blood culture were collected placed on vancomycin /cefepime, Flagyl and admitted for further management Seen by nephrology underwent HD 2/21, blood transfusion for symptomatic anemia.  Blood culture was found to be Staph Lugdunensis bacteremia.    Subjective: Seen and examined this morning, complains of upper neck pain, feels weak wanting to see physical therapy.   Got HD yesterday along with blood transfusion.  Overnight afebrile Labs shows WBC improving 15.6 hemoglobin 8.1.  Assessment and Plan: Principal Problem:   Bacteremia Active Problems:   Essential hypertension   DM (diabetes mellitus), type 2 with peripheral vascular complications (HCC)   Anemia   ESRD (end stage renal disease) on dialysis [facility])   Kidney lesion, native, left   Gynecomastia   Fatigue x 1 week  Staph Lugdunensis bacteremia at outpatient HD Sent by HD to the ED due to bacteremia.  He goes to [place]- had Blood cx done on 05/19/22 per him<Labs with  leukocytosis otherwise blood pressure stable.  Continue Ancef as per pharmacy for the bacteremia.  Will discuss with ID -messaged Dr Johnnye Sima, if he needs a line holiday given patient has HD catheter in place does not appear to be infected/no drainage or redness or tenderness. Repeat blood culture pending from admission.  ESRD on HD MWF: missed HD 2/20,nephrology following got PRN 2/21, Hypertension: BP remains stable, continue Amlodipine 10, Coreg 12.5, clonidine and hydralazine. HLD on Crestor.  Anemia of CKD  Symptomatic anemia with fatigue:hb improved with morning blood draw was in 2/21, continue to monitor,baseline hemoglobin 10 to 12 g Recent Labs    10/21/21 1006 02/08/22 0857 05/24/22 1725 05/24/22 1725 05/25/22 0517 05/26/22 0513  HGB 12.6* 10.9* 7.1*  --  7.1* 8.1*  MCV  --   --  88.8   < > 91.8 89.5  VITAMINB12  --   --   --   --  268  --   FOLATE  --   --   --   --  25.3  --   FERRITIN  --   --   --   --  4,259*  --   TIBC  --   --   --   --  NOT CALCULATED  --   IRON  --   --   --   --  42*  --   RETICCTPCT  --   --   --   --  2.5  --    < > = values in this interval not displayed.       Insulin-dependent diabetes mellitus continue SSI,  blood sugar is controlled Recent Labs  Lab 05/25/22 0517 05/25/22 0733 05/25/22 1130 05/25/22 1622 05/26/22 0019 05/26/22 0813  GLUCAP  --  116* 141* 160* 114* 137*  HGBA1C 5.6  --   --   --   --   --     Left renal lesion outpatient MRI recommended incidentally noted in the CT Gynecomastia right more than left incidental noted on the CT outpatient follow-up and consideration for mammogram is recommended  Class I Obesity:Patient's Body mass index is 34.18 kg/m. : Will benefit with PCP follow-up, weight loss  healthy lifestyle and outpatient sleep evaluation.   DVT prophylaxis: heparin injection 5,000 Units Start: 05/25/22 0600 Code Status:   Code Status: Full Code Family Communication: plan of care discussed with patient at  bedside. Patient status is: admitted as observation but remains hospitalized for ongoing  because of bacteremia Level of care: Telemetry  Dispo: The patient is from: home lives alone            Anticipated disposition: Obtain PT OT evaluation.    Objective: Vitals last 24 hrs: Vitals:   05/25/22 2353 05/26/22 0022 05/26/22 0033 05/26/22 0518  BP: (!) 151/78  (!) 170/66 108/69  Pulse: 82  92 86  Resp: (!) 22  17 20  $ Temp:   97.9 F (36.6 C) 98.9 F (37.2 C)  TempSrc:   Oral Oral  SpO2: 100%  92% 97%  Weight:  114.3 kg    Height:       Weight change: -1.635 kg  Physical Examination: General exam: AA O X.3, weak,older appearing HEENT:Oral mucosa moist, Ear/Nose WNL grossly, dentition normal. Respiratory system: bilaterally clear BS, no use of accessory muscle Cardiovascular system: S1 & S2 +, regular rate, JVD neg. Gastrointestinal system: Abdomen soft,NT,ND,BS+ Nervous System:Alert, awake, moving extremities and grossly nonfocal Extremities: LE ankle edema neg, lower extremities warm Skin: No rashes,no icterus. MSK: Normal muscle bulk,tone, power  Rt chest wall w/ HD catheter w/ dressing in place no erythema tenderness or drainage  Medications reviewed:  Scheduled Meds:  sodium chloride   Intravenous Once   amLODipine  10 mg Oral QHS   aspirin  81 mg Oral QHS   brimonidine  1 drop Right Eye TID   carvedilol  12.5 mg Oral See admin instructions   Chlorhexidine Gluconate Cloth  6 each Topical Daily   Chlorhexidine Gluconate Cloth  6 each Topical Q0600   cloNIDine  0.1 mg Oral QHS   clopidogrel  75 mg Oral QHS   folic acid  1 mg Oral QHS   gabapentin  300 mg Oral QHS   heparin  5,000 Units Subcutaneous Q8H   hydrALAZINE  25 mg Oral See admin instructions   insulin aspart  0-5 Units Subcutaneous QHS   insulin aspart  0-6 Units Subcutaneous TID WC   insulin aspart  3 Units Subcutaneous TID WC   insulin glargine-yfgn  10 Units Subcutaneous QHS   rosuvastatin  20 mg Oral  QHS   sodium chloride flush  3 mL Intravenous Q12H   Continuous Infusions:   ceFAZolin (ANCEF) IV Stopped (05/25/22 2337)    Diet Order             Diet renal with fluid restriction Fluid restriction: 1200 mL Fluid; Room service appropriate? Yes; Fluid consistency: Thin  Diet effective now                  Intake/Output Summary (Last 24 hours) at 05/26/2022 1016 Last data filed  at 05/26/2022 0344 Gross per 24 hour  Intake 1085.58 ml  Output 2000 ml  Net -914.42 ml   Net IO Since Admission: -72.71 mL [05/26/22 1016]  Wt Readings from Last 3 Encounters:  05/26/22 114.3 kg  05/16/22 117.9 kg  02/08/22 117.9 kg     Unresulted Labs (From admission, onward)     Start     Ordered   05/25/22 1142  Hepatitis B surface antibody,quantitative  (New Admission Hemo Labs (Hepatitis B))  Once,   R        05/25/22 1142   05/25/22 [id number]  Basic metabolic panel  Daily,   R      05/25/22 0019   05/25/22 0500  CBC  Daily,   R      05/25/22 0019   Signed and Held  Renal function panel  Once,   R       Question:  Specimen collection method  Answer:  Lab=Lab collect   Signed and Held   Signed and Held  CBC  Once,   R       Question:  Specimen collection method  Answer:  Lab=Lab collect   Signed and Held          Data Reviewed: I have personally reviewed following labs and imaging studies CBC: Recent Labs  Lab 05/24/22 1725 05/25/22 0517 05/26/22 0513  WBC 18.5* 19.3* 15.6*  NEUTROABS 16.0*  --   --   HGB 7.1* 7.1* 8.1*  HCT 20.6* 21.4* 23.9*  MCV 88.8 91.8 89.5  PLT 129* 130* 0000000*   Basic Metabolic Panel: Recent Labs  Lab 05/24/22 1725 05/25/22 0517 05/26/22 0513  NA 131* 132* 132*  K 4.0 4.1 3.8  CL 92* 95* 95*  CO2 22 21* 23  GLUCOSE 140* 134* 156*  BUN 86* 94* 56*  CREATININE 9.50* 9.91* 6.44*  CALCIUM 9.1 8.9 8.6*  MG 2.1  --   --   PHOS  --  6.4*  --    GFR: Estimated Creatinine Clearance: 14.1 mL/min (A) (by C-G formula based on SCr of 6.44 mg/dL  (H)). Liver Function Tests: Recent Labs  Lab 05/24/22 1725 05/25/22 0517  AST 47* 33  ALT 28 24  ALKPHOS 71 66  BILITOT 1.3* 1.3*  PROT 5.7* 5.5*  ALBUMIN 2.3* 2.1*   Recent Labs  Lab 05/24/22 1725  INR 1.3*   Recent Labs  Lab 05/25/22 0733 05/25/22 1130 05/25/22 1622 05/26/22 0019 05/26/22 0813  GLUCAP 116* 141* 160* 114* 137*   Recent Labs  Lab 05/24/22 1725 05/24/22 1952  LATICACIDVEN 1.0 0.9    Recent Results (from the past 240 hour(s))  Blood Culture (routine x 2)     Status: None (Preliminary result)   Collection Time: 05/24/22  5:25 PM   Specimen: Right Antecubital; Blood  Result Value Ref Range Status   Specimen Description RIGHT ANTECUBITAL  Final   Special Requests   Final    BOTTLES DRAWN AEROBIC AND ANAEROBIC Blood Culture adequate volume   Culture   Final    NO GROWTH 2 DAYS Performed at [facility], [address], [place], [place] [address]    Report Status PENDING  Incomplete  Blood Culture (routine x 2)     Status: None (Preliminary result)   Collection Time: 05/24/22  5:25 PM   Specimen: Right Antecubital; Blood  Result Value Ref Range Status   Specimen Description RIGHT ANTECUBITAL  Final   Special Requests   Final    BOTTLES  DRAWN AEROBIC AND ANAEROBIC Blood Culture adequate volume   Culture   Final    NO GROWTH 2 DAYS Performed at [facility], [address] [place], [place] [address]    Report Status PENDING  Incomplete  MRSA Next Gen by PCR, Nasal     Status: None   Collection Time: 05/25/22  3:27 AM   Specimen: Nasal Mucosa; Nasal Swab  Result Value Ref Range Status   MRSA by PCR Next Gen NOT DETECTED NOT DETECTED Final    Comment: (NOTE) The GeneXpert MRSA Assay (FDA approved for NASAL specimens only), is one component of a comprehensive MRSA colonization surveillance program. It is not intended to diagnose MRSA infection nor to guide or monitor treatment for MRSA infections. Test performance is not FDA approved in  patients less than 66 years old. Performed at [facility], [address] [place], [place] [address]   Culture, blood (Routine X 2) w Reflex to ID Panel     Status: None (Preliminary result)   Collection Time: 05/25/22  8:05 PM   Specimen: BLOOD  Result Value Ref Range Status   Specimen Description BLOOD PORTA CATH  Final   Special Requests   Final    Immunocompromised BOTTLES DRAWN AEROBIC AND ANAEROBIC Blood Culture results may not be optimal due to an excessive volume of blood received in culture bottles   Culture   Final    NO GROWTH < 12 HOURS Performed at [facility] [address] [place], [place] [address]    Report Status PENDING  Incomplete  Culture, blood (Routine X 2) w Reflex to ID Panel     Status: None (Preliminary result)   Collection Time: 05/25/22  8:17 PM   Specimen: BLOOD  Result Value Ref Range Status   Specimen Description BLOOD HEMO LINE  Final   Special Requests   Final    Immunocompromised BOTTLES DRAWN AEROBIC AND ANAEROBIC Blood Culture adequate volume   Culture   Final    NO GROWTH < 12 HOURS Performed at [facility], [address], [place], [place] [address]    Report Status PENDING  Incomplete    Antimicrobials: Anti-infectives (From admission, onward)    Start     Dose/Rate Route Frequency Ordered Stop   05/25/22 2000  ceFAZolin (ANCEF) IVPB 2g/100 mL premix        2 g 200 mL/hr over 30 Minutes Intravenous Every M-W-F (Hemodialysis) 05/25/22 1635     05/25/22 0745  metroNIDAZOLE (FLAGYL) IVPB 500 mg  Status:  Discontinued        500 mg 100 mL/hr over 60 Minutes Intravenous Every 12 hours 05/25/22 0019 05/25/22 1625   05/24/22 2102  vancomycin variable dose per unstable renal function (pharmacist dosing)  Status:  Discontinued         Does not apply See admin instructions 05/24/22 2103 05/25/22 1625   05/24/22 2000  vancomycin (VANCOREADY) IVPB 2000 mg/400 mL        2,000 mg 200 mL/hr over 120 Minutes Intravenous  Once 05/24/22 1948 05/25/22  0128   05/24/22 1945  ceFEPIme (MAXIPIME) 2 g in sodium chloride 0.9 % 100 mL IVPB        2 g 200 mL/hr over 30 Minutes Intravenous  Once 05/24/22 1936 05/24/22 2053   05/24/22 1945  metroNIDAZOLE (FLAGYL) IVPB 500 mg        500 mg 100 mL/hr over 60 Minutes Intravenous  Once 05/24/22 1936 05/24/22 2246   05/24/22 1945  vancomycin (  VANCOCIN) IVPB 1000 mg/200 mL premix  Status:  Discontinued        1,000 mg 200 mL/hr over 60 Minutes Intravenous  Once 05/24/22 1936 05/24/22 1948      Culture/Microbiology    Component Value Date/Time   SDES BLOOD HEMO LINE 05/25/2022 2017   SPECREQUEST  05/25/2022 2017    Immunocompromised BOTTLES DRAWN AEROBIC AND ANAEROBIC Blood Culture adequate volume   CULT  05/25/2022 2017    NO GROWTH < 12 HOURS Performed at [facility], [address] [place], [place] [address]    REPTSTATUS PENDING 05/25/2022 2017  Radiology Studies: CT CHEST ABDOMEN PELVIS WO CONTRAST  Result Date: 05/24/2022 CLINICAL DATA:  Abdominal pain, acute, nonlocalized. 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 C/o generalized pain, esp on his bottom. Also s/o SOB EXAM: CT CHEST, ABDOMEN AND PELVIS WITHOUT CONTRAST TECHNIQUE: Multidetector CT imaging of the chest, abdomen and pelvis was performed following the standard protocol without IV contrast. RADIATION DOSE REDUCTION: This exam was performed according to the departmental dose-optimization program which includes automated exposure control, adjustment of the mA and/or kV according to patient size and/or use of iterative reconstruction technique. COMPARISON:  None Available. FINDINGS: CT CHEST FINDINGS Cardiovascular: Left chest wall dual lead pacemaker and defibrillator in grossly appropriate position. Normal heart size. No significant pericardial effusion. The thoracic aorta is normal in caliber. Severe atherosclerotic plaque of the  thoracic aorta. Four-vessel coronary artery calcifications. Aortic valve leaflet calcification. Cardiac findings suggestive of anemia. The main pulmonary artery is enlarged in caliber measuring up to 3.7 cm. Mediastinum/Nodes: No gross hilar adenopathy, noting limited sensitivity for the detection of hilar adenopathy on this noncontrast study. No enlarged mediastinal, hilar, or axillary lymph nodes. Thyroid gland, trachea, and esophagus demonstrate no significant findings. Lungs/Pleura: Expiratory phase of respiration. Diffuse mild bronchial wall thickening. Bilateral lower lobe linear atelectasis versus scarring. No focal consolidation. No pulmonary nodule. No pulmonary mass. No pleural effusion. No pneumothorax. Musculoskeletal: Asymmetric right greater than left gynecomastia. No suspicious lytic or blastic osseous lesions. No acute displaced fracture. Multilevel degenerative changes of the spine. CT ABDOMEN PELVIS FINDINGS Hepatobiliary: No focal liver abnormality. Calcified 3.5 cm gallstone noted within the gallbladder lumen. No gallbladder wall thickening or pericholecystic fluid. No biliary dilatation. Pancreas: No focal lesion. Normal pancreatic contour. No surrounding inflammatory changes. No main pancreatic ductal dilatation. Spleen: Normal in size without focal abnormality. Adrenals/Urinary Tract: No adrenal nodule bilaterally. No nephrolithiasis and no hydronephrosis. Poorly visualized 2.2 cm left renal lesion with a density of 37 Hounsfield units (5:86, 2:65). Fluid density lesions within bilateral kidneys likely represent simple renal cysts. Simple renal cysts, in the absence of clinically indicated signs/symptoms, require no independent follow-up. Subcentimeter hypodensities are too small to characterize-no further follow-up indicated. No ureterolithiasis or hydroureter. The urinary bladder is unremarkable. Stomach/Bowel: Stomach is within normal limits. No evidence of bowel wall thickening or  dilatation. Colonic diverticulosis. Appendix appears normal. Vascular/Lymphatic: No abdominal aorta or iliac aneurysm. Severe atherosclerotic plaque of the aorta and its branches. No abdominal, pelvic, or inguinal lymphadenopathy. Reproductive: Prostate is unremarkable. Other: No intraperitoneal free fluid. No intraperitoneal free gas. No organized fluid collection. Musculoskeletal: Small fat containing umbilical hernia. Mild dermal thickening and subcutaneus soft tissue edema along the anterior abdominal wall (2:111). Mild diffuse subcutaneus soft tissue edema. No suspicious lytic or blastic osseous lesions. No acute displaced fracture. Multilevel degenerative changes of the spine. IMPRESSION: 1. Limited  evaluation on this noncontrast study. 2. Mild dermal thickening and subcutaneus soft tissue edema along the anterior abdominal. No subcutaneus soft tissue emphysema. No organized fluid collection. Finding could represent cellulitis. Correlate with physical exam. 3. Enlarged main pulmonary artery suggestive of pulmonary hypertension. 4. Cholelithiasis with no CT finding of acute cholecystitis. 5. Colonic diverticulosis with no acute diverticulitis. 6. Indeterminate 2.2 cm left renal lesion. When the patient is clinically stable and able to follow directions and hold their breath (preferably as an outpatient) further evaluation with dedicated outpatient MRI renal protocol should be considered. 7. Aortic Atherosclerosis (ICD10-I70.0) including coronary artery and aortic valve leaflet calcifications-correlate for aortic stenosis. 8. Asymmetric right greater than left gynecomastia. Correlate with physical exam. Consider mammographic appointment for further evaluation as underlying malignancy cannot be excluded. Electronically Signed   By: Iven Finn M.D.   On: 05/24/2022 20:40   DG Chest 1 View  Result Date: 05/24/2022 CLINICAL DATA:  Shortness of breath EXAM: CHEST  1 VIEW COMPARISON:  02/08/2022 FINDINGS:  Right IJ dialysis catheter tip: Cavoatrial junction. AICD noted, unchanged positioning. INSERT or chew Low lung volumes are present, causing crowding of the pulmonary vasculature. Linear bandlike opacity along the right hemidiaphragm peripherally. Indistinct density along the left peripheral hemidiaphragm could be from pericardial adipose tissues or localized airspace opacity along the lingula, and is less striking than on the 02/08/2022 exam. IMPRESSION: 1. Indistinct density along the left peripheral hemidiaphragm could be from pericardial adipose tissues or localized airspace opacity along the lingula, and is less striking than on the 02/08/2022 exam. 2. Linear bandlike opacity along the right hemidiaphragm peripherally favors atelectasis. Electronically Signed   By: Van Clines M.D.   On: 05/24/2022 17:39     LOS: 1 day   Antonieta Pert, MD [facility]  05/26/2022, 10:16 AM

## 2022-05-26 NOTE — Evaluation (Signed)
Physical Therapy Evaluation Patient Details Name: [name]. MRN: [id number] DOB: 04-Apr-1956 Today's Date: 05/26/2022  History of Present Illness  [name]. is a 67 y.o. male with medical history significant for hypertension, insulin-dependent diabetes mellitus, PAD, anemia, and ESRD on hemodialysis who presents to the emergency department as directed by dialysis personnel for evaluation of infection.     Patient reports that he has been severely fatigued for the past 1 week, missed dialysis today for this reason, and was contacted by dialysis personnel, informed that he had an infection in his blood, and was directed to the ED.  Patient denies any fever, chills, cough, dysuria, abdominal pain, vomiting, diarrhea, neck stiffness, or new rash.   Clinical Impression  Patient limited for functional mobility as stated below secondary to general weakness, fatigue and neck pain. Patient transitions to seated EOB with HOB elevated and requires increased time due to general weakness. He demonstrates fair/good sitting balance at EOB. Patient neck pain likely related to poor posture with head typically held in flexed position. Performed soft tissue mobilizations to cervical paraspinals with minimal change in symptoms following. Educated on and patient performs seated cervical retractions but has difficulty completing due to poor posture.  Patient will benefit from continued physical therapy in hospital and recommended venue below to increase strength, balance, endurance for safe ADLs and gait.        Recommendations for follow up therapy are one component of a multi-disciplinary discharge planning process, led by the attending physician.  Recommendations may be updated based on patient status, additional functional criteria and insurance authorization.  Follow Up Recommendations Skilled nursing-short term rehab (<3 hours/day) Can patient physically be transported by private vehicle: No     Assistance Recommended at Discharge Intermittent Supervision/Assistance  Patient can return home with the following  A little help with walking and/or transfers;A little help with bathing/dressing/bathroom;Assistance with cooking/housework    Equipment Recommendations None recommended by PT  Recommendations for Other Services       Functional Status Assessment Patient has had a recent decline in their functional status and demonstrates the ability to make significant improvements in function in a reasonable and predictable amount of time.     Precautions / Restrictions Precautions Precautions: Fall Restrictions Weight Bearing Restrictions: No      Mobility  Bed Mobility Overal bed mobility: Needs Assistance Bed Mobility: Supine to Sit     Supine to sit: Min guard, Min assist, HOB elevated     General bed mobility comments: slow, labored with HOB elevated    Transfers                        Ambulation/Gait                  Stairs            Wheelchair Mobility    Modified Rankin (Stroke Patients Only)       Balance Overall balance assessment: Mild deficits observed, not formally tested                                           Pertinent Vitals/Pain Pain Assessment Pain Assessment: 0-10 Pain Score: 7  Pain Location: neck Pain Descriptors / Indicators: Aching, Sore Pain Intervention(s): Limited activity within patient's tolerance, Monitored during session, Repositioned    Home Living Family/patient  expects to be discharged to:: Private residence Living Arrangements: Alone Available Help at Discharge: Family Type of Home: Apartment Home Access: Level entry       Cooper: One Allen: Conservation officer, nature (2 wheels);Shower seat;Wheelchair - manual Additional Comments: not able to weak prostheses due to edema    Prior Function Prior Level of Function : Independent/Modified Independent              Mobility Comments: states mobility in WC, lateral scoots in/out of WC ADLs Comments: independent     Hand Dominance        Extremity/Trunk Assessment   Upper Extremity Assessment Upper Extremity Assessment: Generalized weakness    Lower Extremity Assessment Lower Extremity Assessment: Generalized weakness    Cervical / Trunk Assessment Cervical / Trunk Assessment: Kyphotic  Communication   Communication: No difficulties  Cognition Arousal/Alertness: Awake/alert Behavior During Therapy: WFL for tasks assessed/performed Overall Cognitive Status: Within Functional Limits for tasks assessed                                          General Comments      Exercises Other Exercises Other Exercises: seated cervical retractions 1x 10   Assessment/Plan    PT Assessment Patient needs continued PT services  PT Problem List Decreased strength;Decreased activity tolerance;Decreased mobility;Decreased balance       PT Treatment Interventions DME instruction;Therapeutic exercise;Gait training;Balance training;Stair training;Neuromuscular re-education;Functional mobility training;Therapeutic activities;Patient/family education    PT Goals (Current goals can be found in the Care Plan section)  Acute Rehab PT Goals Patient Stated Goal: get stronger at rehab and go home PT Goal Formulation: With patient Time For Goal Achievement: 06/09/22 Potential to Achieve Goals: Good    Frequency Min 3X/week     Co-evaluation               AM-PAC PT "6 Clicks" Mobility  Outcome Measure Help needed turning from your back to your side while in a flat bed without using bedrails?: A Little Help needed moving from lying on your back to sitting on the side of a flat bed without using bedrails?: A Little Help needed moving to and from a bed to a chair (including a wheelchair)?: A Lot Help needed standing up from a chair using your arms (e.g., wheelchair or bedside  chair)?: A Lot Help needed to walk in hospital room?: Total Help needed climbing 3-5 steps with a railing? : Total 6 Click Score: 12    End of Session Equipment Utilized During Treatment: Oxygen Activity Tolerance: Patient tolerated treatment well Patient left: in bed;with call bell/phone within reach;with nursing/sitter in room Nurse Communication: Mobility status PT Visit Diagnosis: Other abnormalities of gait and mobility (R26.89);Muscle weakness (generalized) (M62.81)    Time: [id number] PT Time Calculation (min) (ACUTE ONLY): 12 min   Charges:   PT Evaluation $PT Eval Low Complexity: 1 Low PT Treatments $Therapeutic Exercise: 8-22 mins        12:55 PM, 05/26/22 Mearl Latin PT, DPT Physical Therapist at [facility]

## 2022-05-26 NOTE — Procedures (Signed)
Hemodialysis Note:  Completed 3.5 hour treatment as ordered. Net fluid removal 2042m and blood volume processed 84L. 1 unit blood transfused and cefazolin administered as ordered during treatment. Blood cultures x2 drawn pre treatment from HD cath and during treatment from HD arterial line. HD catheter dressing changed and lumens are flushed, heparin locked, clamped, and dead end capped x2. Patient tolerated treatment without issue and remains asymptomatic. Vital signs stable post treatment. Hand off given to primary nurse. Patient transported back to room by this RN.

## 2022-05-26 NOTE — Progress Notes (Addendum)
ID Remote note 67 yo M with IDDM, ESRD, Pacer, adm on 2-20 after missing HD and 1 week of fatigue.  He had BCx done at HD prior which grew Staph lugdenensis and he was sent to ED.  In ED he was afebrile, WBC 18.5 and he had CT c/a/p that showed mild skin thickening and edema of abd wall. He was started on vanco/cefepime/flagyl. Since changed to ancef.   He c/o neck pain this AM. He had CT neck earlier this month. No infection noted.  He has an HD cath.  Afebrile, episodes of HTN.    A/p Bacteremia- Staph lugdenensis Neck Pain ESRD DM2 Pacer  Would  Continue ancef Await repeat BCx, so far ngtd from adm.  Check TEE if he can tolerate, his pacer makes him high risk.  Consider CV eval.  Consider repeat imaging of his neck Agree with line holiday (after HD 05-27-22) Please place his outside BCx in media tab (I'm assuming he has MSSE since pharm changed his anbx to ancef).  Appreciate pharm dosing ancef with his HD.   Comment- S lugdenensis can be quite aggressive, similar to S aureus.

## 2022-05-26 NOTE — Progress Notes (Addendum)
Patient ID: [name] Pick., male   DOB: Jan 31, 1956, 67 y.o.   MRN: [id number] S: Complaining of weakness and neck pain this morning. O:BP 108/69 (BP Location: Left Arm)   Pulse 86   Temp 98.9 F (37.2 C) (Oral)   Resp 20   Ht 6' (1.829 m)   Wt 114.3 kg   SpO2 97%   BMI 34.18 kg/m   Intake/Output Summary (Last 24 hours) at 05/26/2022 0853 Last data filed at 05/26/2022 0344 Gross per 24 hour  Intake 1325.58 ml  Output 2000 ml  Net -674.42 ml   Intake/Output: I/O last 3 completed shifts: In: 1927.3 [P.O.:920; Blood:315; IV Piggyback:692.3] Out: 2000 [Other:2000]  Intake/Output this shift:  No intake/output data recorded. Weight change: -1.635 kg Gen: NAD CVS: RRR Resp:CTA Abd: +BS, soft, NT/NT, obese Ext: s/p bilateral BKA's, no edema, rash on left arm with scaly plaques  Recent Labs  Lab 05/24/22 1725 05/25/22 0517 05/26/22 0513  NA 131* 132* 132*  K 4.0 4.1 3.8  CL 92* 95* 95*  CO2 22 21* 23  GLUCOSE 140* 134* 156*  BUN 86* 94* 56*  CREATININE 9.50* 9.91* 6.44*  ALBUMIN 2.3* 2.1*  --   CALCIUM 9.1 8.9 8.6*  PHOS  --  6.4*  --   AST 47* 33  --   ALT 28 24  --    Liver Function Tests: Recent Labs  Lab 05/24/22 1725 05/25/22 0517  AST 47* 33  ALT 28 24  ALKPHOS 71 66  BILITOT 1.3* 1.3*  PROT 5.7* 5.5*  ALBUMIN 2.3* 2.1*   No results for input(s): "LIPASE", "AMYLASE" in the last 168 hours. No results for input(s): "AMMONIA" in the last 168 hours. CBC: Recent Labs  Lab 05/24/22 1725 05/25/22 0517 05/26/22 0513  WBC 18.5* 19.3* 15.6*  NEUTROABS 16.0*  --   --   HGB 7.1* 7.1* 8.1*  HCT 20.6* 21.4* 23.9*  MCV 88.8 91.8 89.5  PLT 129* 130* 109*   Cardiac Enzymes: No results for input(s): "CKTOTAL", "CKMB", "CKMBINDEX", "TROPONINI" in the last 168 hours. CBG: Recent Labs  Lab 05/25/22 0733 05/25/22 1130 05/25/22 1622 05/26/22 0019 05/26/22 0813  GLUCAP 116* 141* 160* 114* 137*    Iron Studies:  Recent Labs    05/25/22 0517  IRON 42*   TIBC NOT CALCULATED  FERRITIN 4,259*   Studies/Results: CT CHEST ABDOMEN PELVIS WO CONTRAST  Result Date: 05/24/2022 CLINICAL DATA:  Abdominal pain, acute, nonlocalized. 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 C/o generalized pain, esp on his bottom. Also s/o SOB EXAM: CT CHEST, ABDOMEN AND PELVIS WITHOUT CONTRAST TECHNIQUE: Multidetector CT imaging of the chest, abdomen and pelvis was performed following the standard protocol without IV contrast. RADIATION DOSE REDUCTION: This exam was performed according to the departmental dose-optimization program which includes automated exposure control, adjustment of the mA and/or kV according to patient size and/or use of iterative reconstruction technique. COMPARISON:  None Available. FINDINGS: CT CHEST FINDINGS Cardiovascular: Left chest wall dual lead pacemaker and defibrillator in grossly appropriate position. Normal heart size. No significant pericardial effusion. The thoracic aorta is normal in caliber. Severe atherosclerotic plaque of the thoracic aorta. Four-vessel coronary artery calcifications. Aortic valve leaflet calcification. Cardiac findings suggestive of anemia. The main pulmonary artery is enlarged in caliber measuring up to 3.7 cm. Mediastinum/Nodes: No gross hilar adenopathy, noting limited sensitivity for the detection of hilar adenopathy on this noncontrast study.  No enlarged mediastinal, hilar, or axillary lymph nodes. Thyroid gland, trachea, and esophagus demonstrate no significant findings. Lungs/Pleura: Expiratory phase of respiration. Diffuse mild bronchial wall thickening. Bilateral lower lobe linear atelectasis versus scarring. No focal consolidation. No pulmonary nodule. No pulmonary mass. No pleural effusion. No pneumothorax. Musculoskeletal: Asymmetric right greater than left gynecomastia. No suspicious lytic or blastic osseous  lesions. No acute displaced fracture. Multilevel degenerative changes of the spine. CT ABDOMEN PELVIS FINDINGS Hepatobiliary: No focal liver abnormality. Calcified 3.5 cm gallstone noted within the gallbladder lumen. No gallbladder wall thickening or pericholecystic fluid. No biliary dilatation. Pancreas: No focal lesion. Normal pancreatic contour. No surrounding inflammatory changes. No main pancreatic ductal dilatation. Spleen: Normal in size without focal abnormality. Adrenals/Urinary Tract: No adrenal nodule bilaterally. No nephrolithiasis and no hydronephrosis. Poorly visualized 2.2 cm left renal lesion with a density of 37 Hounsfield units (5:86, 2:65). Fluid density lesions within bilateral kidneys likely represent simple renal cysts. Simple renal cysts, in the absence of clinically indicated signs/symptoms, require no independent follow-up. Subcentimeter hypodensities are too small to characterize-no further follow-up indicated. No ureterolithiasis or hydroureter. The urinary bladder is unremarkable. Stomach/Bowel: Stomach is within normal limits. No evidence of bowel wall thickening or dilatation. Colonic diverticulosis. Appendix appears normal. Vascular/Lymphatic: No abdominal aorta or iliac aneurysm. Severe atherosclerotic plaque of the aorta and its branches. No abdominal, pelvic, or inguinal lymphadenopathy. Reproductive: Prostate is unremarkable. Other: No intraperitoneal free fluid. No intraperitoneal free gas. No organized fluid collection. Musculoskeletal: Small fat containing umbilical hernia. Mild dermal thickening and subcutaneus soft tissue edema along the anterior abdominal wall (2:111). Mild diffuse subcutaneus soft tissue edema. No suspicious lytic or blastic osseous lesions. No acute displaced fracture. Multilevel degenerative changes of the spine. IMPRESSION: 1. Limited evaluation on this noncontrast study. 2. Mild dermal thickening and subcutaneus soft tissue edema along the anterior  abdominal. No subcutaneus soft tissue emphysema. No organized fluid collection. Finding could represent cellulitis. Correlate with physical exam. 3. Enlarged main pulmonary artery suggestive of pulmonary hypertension. 4. Cholelithiasis with no CT finding of acute cholecystitis. 5. Colonic diverticulosis with no acute diverticulitis. 6. Indeterminate 2.2 cm left renal lesion. When the patient is clinically stable and able to follow directions and hold their breath (preferably as an outpatient) further evaluation with dedicated outpatient MRI renal protocol should be considered. 7. Aortic Atherosclerosis (ICD10-I70.0) including coronary artery and aortic valve leaflet calcifications-correlate for aortic stenosis. 8. Asymmetric right greater than left gynecomastia. Correlate with physical exam. Consider mammographic appointment for further evaluation as underlying malignancy cannot be excluded. Electronically Signed   By: Iven Finn M.D.   On: 05/24/2022 20:40   DG Chest 1 View  Result Date: 05/24/2022 CLINICAL DATA:  Shortness of breath EXAM: CHEST  1 VIEW COMPARISON:  02/08/2022 FINDINGS: Right IJ dialysis catheter tip: Cavoatrial junction. AICD noted, unchanged positioning. INSERT or chew Low lung volumes are present, causing crowding of the pulmonary vasculature. Linear bandlike opacity along the right hemidiaphragm peripherally. Indistinct density along the left peripheral hemidiaphragm could be from pericardial adipose tissues or localized airspace opacity along the lingula, and is less striking than on the 02/08/2022 exam. IMPRESSION: 1. Indistinct density along the left peripheral hemidiaphragm could be from pericardial adipose tissues or localized airspace opacity along the lingula, and is less striking than on the 02/08/2022 exam. 2. Linear bandlike opacity along the right hemidiaphragm peripherally favors atelectasis. Electronically Signed   By: Van Clines M.D.   On: 05/24/2022 17:39     sodium chloride  Intravenous Once   amLODipine  10 mg Oral QHS   aspirin  81 mg Oral QHS   brimonidine  1 drop Right Eye TID   carvedilol  12.5 mg Oral See admin instructions   Chlorhexidine Gluconate Cloth  6 each Topical Daily   Chlorhexidine Gluconate Cloth  6 each Topical Q0600   cloNIDine  0.1 mg Oral QHS   clopidogrel  75 mg Oral QHS   folic acid  1 mg Oral QHS   gabapentin  300 mg Oral QHS   heparin  5,000 Units Subcutaneous Q8H   hydrALAZINE  25 mg Oral See admin instructions   insulin aspart  0-5 Units Subcutaneous QHS   insulin aspart  0-6 Units Subcutaneous TID WC   insulin aspart  3 Units Subcutaneous TID WC   insulin glargine-yfgn  10 Units Subcutaneous QHS   rosuvastatin  20 mg Oral QHS   sodium chloride flush  3 mL Intravenous Q12H    BMET    Component Value Date/Time   NA 132 (L) 05/26/2022 0513   K 3.8 05/26/2022 0513   CL 95 (L) 05/26/2022 0513   CO2 23 05/26/2022 0513   GLUCOSE 156 (H) 05/26/2022 0513   BUN 56 (H) 05/26/2022 0513   CREATININE 6.44 (H) 05/26/2022 0513   CALCIUM 8.6 (L) 05/26/2022 0513   GFRNONAA 9 (L) 05/26/2022 0513   GFRAA 20 (L) 10/21/2016 0703   CBC    Component Value Date/Time   WBC 15.6 (H) 05/26/2022 0513   RBC 2.67 (L) 05/26/2022 0513   HGB 8.1 (L) 05/26/2022 0513   HCT 23.9 (L) 05/26/2022 0513   PLT 109 (L) 05/26/2022 0513   MCV 89.5 05/26/2022 0513   MCH 30.3 05/26/2022 0513   MCHC 33.9 05/26/2022 0513   RDW 16.9 (H) 05/26/2022 0513   LYMPHSABS 1.0 05/24/2022 1725   MONOABS 1.0 05/24/2022 1725   EOSABS 0.3 05/24/2022 1725   BASOSABS 0.0 05/24/2022 1725    Dialysis Orders: Center: [place]  on MWF . EDW 116.5kg HD Bath 2K/2.5Ca  Time 4 hours Heparin 2000 units IVP then 1000 units/hr. Access RIJ TDC BFR 400 DFR 500 UF profile 2        Assessment/Plan:  Staph Lugdunensis bacteremia - currently on maxipime, vancomycin, and flagyl.  Repeat blood cultures drawn in ED.  Usually skin and soft tissue infections.   Recommend ID consult for abx guidance and to address the possible need for a line holiday.  If they feel that he will need a line holiday, would recommend removing [facility] tomorrow after HD then would need new TDC placed on Monday by IR.  Blood cultures drawn here negative for growth over last 2 days, continue to follow.  Now on cefazolin per pharmacy.  ESRD -  Will plan for HD tomorrow to keep on MWF schedule  Hypertension/volume  - stable but below edw.  Will need new lower edw at time of discharge  Anemia  - will dose ESA and transfuse as needed for Hgb <7  Metabolic bone disease -  continue with home meds  Nutrition - renal diet, carb modified.  Weakness and deconditioning - he lives by himself and would benefit from PT/OT evaluation and possible SNF placement for rehab.    Donetta Potts, MD [facility]

## 2022-05-26 NOTE — Plan of Care (Signed)
  Problem: Acute Rehab PT Goals(only PT should resolve) Goal: Pt Will Transfer Bed To Chair/Chair To Bed Outcome: Progressing Flowsheets (Taken 05/26/2022 1256) Pt will Transfer Bed to Chair/Chair to Bed:  min guard assist  with min assist Goal: Pt/caregiver will Perform Home Exercise Program Outcome: Progressing Flowsheets (Taken 05/26/2022 1256) Pt/caregiver will Perform Home Exercise Program:  For increased strengthening  For improved balance  Independently  12:56 PM, 05/26/22 Mearl Latin PT, DPT Physical Therapist at [facility]

## 2022-05-26 NOTE — Progress Notes (Addendum)
Attached is patient's blood culture results from [place] collected on 05/20/22

## 2022-05-27 ENCOUNTER — Other Ambulatory Visit (HOSPITAL_COMMUNITY): Payer: Self-pay | Admitting: *Deleted

## 2022-05-27 ENCOUNTER — Inpatient Hospital Stay (HOSPITAL_COMMUNITY): Payer: Medicare Other

## 2022-05-27 DIAGNOSIS — R7881 Bacteremia: Secondary | ICD-10-CM

## 2022-05-27 DIAGNOSIS — N186 End stage renal disease: Secondary | ICD-10-CM | POA: Diagnosis not present

## 2022-05-27 DIAGNOSIS — Z992 Dependence on renal dialysis: Secondary | ICD-10-CM | POA: Diagnosis not present

## 2022-05-27 LAB — BASIC METABOLIC PANEL
Anion gap: 13 (ref 5–15)
BUN: 69 mg/dL — ABNORMAL HIGH (ref 8–23)
CO2: 23 mmol/L (ref 22–32)
Calcium: 9 mg/dL (ref 8.9–10.3)
Chloride: 95 mmol/L — ABNORMAL LOW (ref 98–111)
Creatinine, Ser: 7.92 mg/dL — ABNORMAL HIGH (ref 0.61–1.24)
GFR, Estimated: 7 mL/min — ABNORMAL LOW (ref >60)
Glucose, Bld: 150 mg/dL — ABNORMAL HIGH (ref 70–99)
Potassium: 3.7 mmol/L (ref 3.5–5.1)
Sodium: 131 mmol/L — ABNORMAL LOW (ref 135–145)

## 2022-05-27 LAB — GLUCOSE, CAPILLARY
Glucose-Capillary: 143 mg/dL — ABNORMAL HIGH (ref 70–99)
Glucose-Capillary: 125 mg/dL — ABNORMAL HIGH (ref 70–99)
Glucose-Capillary: 194 mg/dL — ABNORMAL HIGH (ref 70–99)
Glucose-Capillary: 179 mg/dL — ABNORMAL HIGH (ref 70–99)

## 2022-05-27 LAB — ECHOCARDIOGRAM COMPLETE
AR max vel: 1.57 cm2
AV Area VTI: 1.67 cm2
AV Area mean vel: 1.6 cm2
AV Mean grad: 11 mmHg
AV Peak grad: 22.2 mmHg
Ao pk vel: 2.35 m/s
Area-P 1/2: 4.8 cm2
Height: 72 in
S' Lateral: 3.3 cm
Weight: 3985.92 oz

## 2022-05-27 LAB — HEPATITIS B SURFACE ANTIBODY, QUANTITATIVE: Hep B S AB Quant (Post): 38.4 m[IU]/mL (ref >9.9)

## 2022-05-27 LAB — CBC
HCT: 25.6 % — ABNORMAL LOW (ref 39.0–52.0)
Hemoglobin: 8.5 g/dL — ABNORMAL LOW (ref 13.0–17.0)
MCH: 30.6 pg (ref 26.0–34.0)
MCHC: 33.2 g/dL (ref 30.0–36.0)
MCV: 92.1 fL (ref 80.0–100.0)
Platelets: 157 10*3/uL (ref 150–400)
RBC: 2.78 MIL/uL — ABNORMAL LOW (ref 4.22–5.81)
RDW: 17.2 % — ABNORMAL HIGH (ref 11.5–15.5)
WBC: 18 10*3/uL — ABNORMAL HIGH (ref 4.0–10.5)
nRBC: 0 % (ref 0.0–0.2)

## 2022-05-27 MED ORDER — ALBUMIN HUMAN 25 % IV SOLN
25.0000 g | INTRAVENOUS | Status: AC | PRN
  Administered 2022-06-03: 25 g via INTRAVENOUS
  Filled 2022-05-27: qty 100

## 2022-05-27 MED ORDER — SODIUM CHLORIDE 0.9 % IV SOLN: INTRAVENOUS | Status: DC

## 2022-05-27 MED ORDER — ALBUMIN HUMAN 25 % IV SOLN
INTRAVENOUS | Status: AC
  Administered 2022-05-27: 25 g via INTRAVENOUS
  Filled 2022-05-27: qty 100

## 2022-05-27 MED ORDER — PERFLUTREN LIPID MICROSPHERE
1.0000 mL | INTRAVENOUS | Status: AC | PRN
  Administered 2022-05-27: 3 mL via INTRAVENOUS

## 2022-05-27 NOTE — Progress Notes (Signed)
Remote ID note WBC stable, elevated  Afebrile HD line to be removed today TEE pending.  He has pacer.... Continue ancef. BCx 2/2 pans sens Staph lugdenensis Repeat BCx 2-20 and 2-21 are ngtd.

## 2022-05-27 NOTE — Progress Notes (Signed)
  HEMODIALYSIS TREATMENT NOTE:  3.5 hour heparin-free treatment completed using RIJ tDC.  Goal met: 1 liter removed without interruption in UF.  All blood was returned.  Catheter limbs were flushed/locked with NS in anticipation of TDC removal this afternoon.    05/27/22 1323  Vital Signs  Temp 99.1 F (37.3 C)  Temp Source Oral  Pulse Rate 77  Pulse Rate Source Monitor  Resp 15  BP (!) 140/67  BP Location Right Arm  BP Method Automatic  Patient Position (if appropriate) Lying  Oxygen Therapy  SpO2 95 %  O2 Device Room Air  Dialysis Weight  Weight 113 kg  Type of Weight Post-Dialysis  Post Treatment  Dialyzer Clearance Lightly streaked  Duration of HD Treatment -hour(s) 3.5 hour(s)  Hemodialysis Intake (mL) 100 mL (Albumin)  Liters Processed 84  Fluid Removed (mL) 1000 mL  Tolerated HD Treatment Yes  Post-Hemodialysis Comments Goal met (modified per v.o. Dr. Marval Regal)    Rockwell Alexandria, RN AP K[facility]

## 2022-05-27 NOTE — Progress Notes (Signed)
Report given to [place] on 5MW. Patient being transported to [facility] campus via [place].

## 2022-05-27 NOTE — Progress Notes (Signed)
PROGRESS NOTE [name].  [id number] DOB: June 07, 1955 DOA: 05/24/2022 PCP: [name], MD   Brief Narrative/Hospital Course: 67 yom w/ hypertension,IDDM,PAD,anemia, and ESRD on hd sent from dialysis for evaluation of infection.   Patient reports that he has been severely fatigued for the past 1 week, missed dialysis 2/20 for this reason, and was contacted by dialysis personnel, informed that he had an infection in his blood, and was directed to the ED. He denies any fever, chills, cough, dysuria, abdominal pain, vomiting, diarrhea, neck stiffness, or new rash. In [id number] and saturating mid 90s on room air with normal heart rate and stable blood pressure.CT of the chest, abdomen, and pelvis is notable for mild skin thickening and edema of the abdominal wall without gas or abscess, left renal lesion, right greater than left gynecomastia, and other nonacute findings.  Labs are notable for BUN 86, albumin 2.3, WBC 18,500, hemoglobin 7.1, and lactic acid 1.0. Blood culture were collected placed on vancomycin /cefepime, Flagyl and admitted for further management Seen by nephrology underwent HD 2/21, blood transfusion for symptomatic anemia.  Blood culture was found to be Staph Lugdunensis bacteremia.  Infectious disease was consulted, continued on Ancef.    Subjective: Seen and examined this morning. He is on the order dialysis today no new complaints Overnight Tmax 99.9, labs reviewed up at 18 from 15  Assessment and Plan: Principal Problem:   Bacteremia Active Problems:   Essential hypertension   DM (diabetes mellitus), type 2 with peripheral vascular complications (HCC)   Anemia   ESRD (end stage renal disease) on dialysis  [facility])   Kidney lesion, native, left   Gynecomastia   Staph Lugdunensis bacteremia at outpatient HD Sent by HD to the ED due to bacteremia.  He goes to [place]- had Blood cx done on 05/19/22 per him<Labs with leukocytosis otherwise blood pressure stable. Per  Nephro- blood cx growing S Lugdunesis> pharmacy managing on Ancef.  ID input appreciated advised line Holiday> will plan to remove HD catheter after dialysis by general surgery, will need PRN catheter reinserted by IR at [facility] prior to next dialysis.  Ordering echocardiogram, will need TEE [facility] next wk-cardiology team informed and they will be arranging TEE -given his pacer wire.Repeat blood culture 2/21- NGTD  ESRD on HD MWF: missed HD 2/20,nephrology following-for HD 2/3 then line holiday Hypertension: BP well-controlled on Amlodipine 10, Coreg 12.5, clonidine and hydralazine. HLD on Crestor.  Anemia of CKD  Symptomatic anemia with fatigue:hb improved with PRBC transfusion, monitor.Baseline hemoglobin 10 to 12 g Recent Labs    02/08/22 0857 05/24/22 1725 05/24/22 1725 05/25/22 0517 05/26/22 0513 05/27/22 0427  HGB 10.9* 7.1*  --  7.1* 8.1* 8.5*  MCV  --  88.8   < > 91.8 89.5 92.1  VITAMINB12  --   --   --  268  --   --   FOLATE  --   --   --  25.3  --   --   FERRITIN  --   --   --  4,259*  --   --   TIBC  --   --   --  NOT CALCULATED  --   --   IRON  --   --   --  42*  --   --   RETICCTPCT  --   --   --  2.5  --   --    < > = values in this interval not displayed.  Insulin-dependent diabetes mellitus -stable continue SSI Recent Labs  Lab 05/25/22 0517 05/25/22 0733 05/26/22 0813 05/26/22 1206 05/26/22 1656 05/26/22 2104 05/27/22 0751  GLUCAP  --    < > 137* 106* 176* 185* 143*  HGBA1C 5.6  --   --   --   --   --   --    < > = values in this interval not displayed.    Left renal lesion outpatient MRI recommended incidentally noted in the CT Gynecomastia right more than left incidental noted on the CT outpatient follow-up and consideration for mammogram is recommended  Deconditioning/Debility- Cont PTOT  Class I Obesity:Patient's Body mass index is 34.12 kg/m. : Will benefit with PCP follow-up, weight loss  healthy lifestyle and outpatient sleep  evaluation.   DVT prophylaxis: heparin injection 5,000 Units Start: 05/25/22 0600 Code Status:   Code Status: Full Code Family Communication: plan of care discussed with patient at bedside. Patient status is: admitted as observation but remains hospitalized for ongoing  because of bacteremia Level of care: Telemetry Medical  Dispo: The patient is from: home lives alone            Anticipated disposition: [facility] as his HD catheter will be removed after HD today and he needs HD catheter reinstated after line holiday before next dialysis on Monday at [facility] by IR- please consult IR on arrival also needs TEE.   Objective: Vitals last 24 hrs: Vitals:   05/27/22 0945 05/27/22 1000 05/27/22 1015 05/27/22 1030  BP: (!) 102/47 (!) 98/55 (!) 118/50 120/61  Pulse:      Resp: 20 (!) 21 (!) 21 17  Temp:      TempSrc:      SpO2:      Weight:      Height:       Weight change:   Physical Examination: General exam: AAox3, weak,older appearing HEENT:Oral mucosa moist, Ear/Nose WNL grossly, dentition normal. Respiratory system: bilaterally clear BS, no use of accessory muscle Cardiovascular system: S1 & S2 +, regular rate, JVD NEG. Gastrointestinal system: Abdomen soft, NT,ND,BS+ Nervous System:Alert, awake, B/L bka Extremities: B/L bka status Skin: No rashes,no icterus. MSK: Normal muscle bulk,tone, power  Rt chest wall w/ HD catheter w/ dressing in place no erythema tenderness or drainage  Medications reviewed:  Scheduled Meds:  sodium chloride   Intravenous Once   amLODipine  10 mg Oral QHS   aspirin  81 mg Oral QHS   brimonidine  1 drop Right Eye TID   carvedilol  12.5 mg Oral See admin instructions   Chlorhexidine Gluconate Cloth  6 each Topical Daily   cloNIDine  0.1 mg Oral QHS   clopidogrel  75 mg Oral QHS   folic acid  1 mg Oral QHS   gabapentin  300 mg Oral QHS   heparin  5,000 Units Subcutaneous Q8H   hydrALAZINE  25 mg Oral See admin instructions   insulin  aspart  0-5 Units Subcutaneous QHS   insulin aspart  0-6 Units Subcutaneous TID WC   insulin aspart  3 Units Subcutaneous TID WC   insulin glargine-yfgn  10 Units Subcutaneous QHS   rosuvastatin  20 mg Oral QHS   sodium chloride flush  3 mL Intravenous Q12H   Continuous Infusions:  albumin human     albumin human      ceFAZolin (ANCEF) IV Stopped (05/25/22 2337)    Diet Order  Diet renal/carb modified with fluid restriction Diet-HS Snack? Nothing; Fluid restriction: 1200 mL Fluid; Room service appropriate? Yes; Fluid consistency: Thin  Diet effective now                  Intake/Output Summary (Last 24 hours) at 05/27/2022 1036 Last data filed at 05/27/2022 0810 Gross per 24 hour  Intake 483 ml  Output --  Net 483 ml   Net IO Since Admission: 410.29 mL [05/27/22 1036]  Wt Readings from Last 3 Encounters:  05/27/22 114.1 kg  05/16/22 117.9 kg  02/08/22 117.9 kg     Unresulted Labs (From admission, onward)    None     Data Reviewed: I have personally reviewed following labs and imaging studies CBC: Recent Labs  Lab 05/24/22 1725 05/25/22 0517 05/26/22 0513 05/27/22 0427  WBC 18.5* 19.3* 15.6* 18.0*  NEUTROABS 16.0*  --   --   --   HGB 7.1* 7.1* 8.1* 8.5*  HCT 20.6* 21.4* 23.9* 25.6*  MCV 88.8 91.8 89.5 92.1  PLT 129* 130* 109* [id number]   Basic Metabolic Panel: Recent Labs  Lab 05/24/22 1725 05/25/22 0517 05/26/22 0513 05/27/22 0427  NA 131* 132* 132* 131*  K 4.0 4.1 3.8 3.7  CL 92* 95* 95* 95*  CO2 22 21* 23 23  GLUCOSE 140* 134* 156* 150*  BUN 86* 94* 56* 69*  CREATININE 9.50* 9.91* 6.44* 7.92*  CALCIUM 9.1 8.9 8.6* 9.0  MG 2.1  --   --   --   PHOS  --  6.4*  --   --    GFR: Estimated Creatinine Clearance: 11.5 mL/min (A) (by C-G formula based on SCr of 7.92 mg/dL (H)). Liver Function Tests: Recent Labs  Lab 05/24/22 1725 05/25/22 0517  AST 47* 33  ALT 28 24  ALKPHOS 71 66  BILITOT 1.3* 1.3*  PROT 5.7* 5.5*  ALBUMIN 2.3* 2.1*    Recent Labs  Lab 05/24/22 1725  INR 1.3*   Recent Labs  Lab 05/26/22 0813 05/26/22 1206 05/26/22 1656 05/26/22 2104 05/27/22 0751  GLUCAP 137* 106* 176* 185* 143*   Recent Labs  Lab 05/24/22 1725 05/24/22 1952  LATICACIDVEN 1.0 0.9    Recent Results (from the past 240 hour(s))  Blood Culture (routine x 2)     Status: None (Preliminary result)   Collection Time: 05/24/22  5:25 PM   Specimen: Right Antecubital; Blood  Result Value Ref Range Status   Specimen Description RIGHT ANTECUBITAL  Final   Special Requests   Final    BOTTLES DRAWN AEROBIC AND ANAEROBIC Blood Culture adequate volume   Culture   Final    NO GROWTH 3 DAYS Performed at [facility], [address] [place], [place] [address]    Report Status PENDING  Incomplete  Blood Culture (routine x 2)     Status: None (Preliminary result)   Collection Time: 05/24/22  5:25 PM   Specimen: Right Antecubital; Blood  Result Value Ref Range Status   Specimen Description RIGHT ANTECUBITAL  Final   Special Requests   Final    BOTTLES DRAWN AEROBIC AND ANAEROBIC Blood Culture adequate volume   Culture   Final    NO GROWTH 3 DAYS Performed at [facility], [address], [place], [place] [address]    Report Status PENDING  Incomplete  MRSA Next Gen by PCR, Nasal     Status: None   Collection Time: 05/25/22  3:27 AM   Specimen: Nasal Mucosa; Nasal Swab  Result Value Ref Range Status   MRSA by PCR Next Gen NOT DETECTED NOT DETECTED Final    Comment: (NOTE) The GeneXpert MRSA Assay (FDA approved for NASAL specimens only), is one component of a comprehensive MRSA colonization surveillance program. It is not intended to diagnose MRSA infection nor to guide or monitor treatment for MRSA infections. Test performance is not FDA approved in patients less than 107 years old. Performed at [facility], [address], [place], [place] [address]   Culture, blood (Routine X 2) w Reflex to ID Panel     Status: None  (Preliminary result)   Collection Time: 05/25/22  8:05 PM   Specimen: BLOOD  Result Value Ref Range Status   Specimen Description BLOOD PORTA CATH  Final   Special Requests   Final    Immunocompromised BOTTLES DRAWN AEROBIC AND ANAEROBIC Blood Culture results may not be optimal due to an excessive volume of blood received in culture bottles   Culture   Final    NO GROWTH 2 DAYS Performed at [facility], [address], [place], [place] [address]    Report Status PENDING  Incomplete  Culture, blood (Routine X 2) w Reflex to ID Panel     Status: None (Preliminary result)   Collection Time: 05/25/22  8:17 PM   Specimen: BLOOD  Result Value Ref Range Status   Specimen Description BLOOD HEMO LINE  Final   Special Requests   Final    Immunocompromised BOTTLES DRAWN AEROBIC AND ANAEROBIC Blood Culture adequate volume   Culture   Final    NO GROWTH 2 DAYS Performed at [facility], [address], [place], [place] [address]    Report Status PENDING  Incomplete    Antimicrobials: Anti-infectives (From admission, onward)    Start     Dose/Rate Route Frequency Ordered Stop   05/25/22 2000  ceFAZolin (ANCEF) IVPB 2g/100 mL premix        2 g 200 mL/hr over 30 Minutes Intravenous Every M-W-F (Hemodialysis) 05/25/22 1635     05/25/22 0745  metroNIDAZOLE (FLAGYL) IVPB 500 mg  Status:  Discontinued        500 mg 100 mL/hr over 60 Minutes Intravenous Every 12 hours 05/25/22 0019 05/25/22 1625   05/24/22 2102  vancomycin variable dose per unstable renal function (pharmacist dosing)  Status:  Discontinued         Does not apply See admin instructions 05/24/22 2103 05/25/22 1625   05/24/22 2000  vancomycin (VANCOREADY) IVPB 2000 mg/400 mL        2,000 mg 200 mL/hr over 120 Minutes Intravenous  Once 05/24/22 1948 05/25/22 0128   05/24/22 1945  ceFEPIme (MAXIPIME) 2 g in sodium chloride 0.9 % 100 mL IVPB        2 g 200 mL/hr over 30 Minutes Intravenous  Once 05/24/22 1936 05/24/22 2053   05/24/22  1945  metroNIDAZOLE (FLAGYL) IVPB 500 mg        500 mg 100 mL/hr over 60 Minutes Intravenous  Once 05/24/22 1936 05/24/22 2246   05/24/22 1945  vancomycin (VANCOCIN) IVPB 1000 mg/200 mL premix  Status:  Discontinued        1,000 mg 200 mL/hr over 60 Minutes Intravenous  Once 05/24/22 1936 05/24/22 1948      Culture/Microbiology    Component Value Date/Time   SDES BLOOD HEMO LINE 05/25/2022 2017   SPECREQUEST  05/25/2022 2017    Immunocompromised BOTTLES DRAWN AEROBIC AND ANAEROBIC Blood Culture adequate volume  CULT  05/25/2022 2017    NO GROWTH 2 DAYS Performed at [facility], [address] [place], [place] [address]    REPTSTATUS PENDING 05/25/2022 2017  Radiology Studies: No results found.   LOS: 2 days   Antonieta Pert, MD [facility]  05/27/2022, 10:36 AM

## 2022-05-27 NOTE — Consult Note (Signed)
Surgery consulted to remove permacath due to bacteremia.  Patient had just finished dialysis.  Right IJ permacath removed in total without difficulty.  The tip of the catheter was sent for culture.  Pressure was held in the right lower neck.  Patient tolerated procedure well.

## 2022-05-27 NOTE — Procedures (Signed)
I was present at this dialysis session. I have reviewed the session itself and made appropriate changes.   Vital signs in last 24 hours:  Temp:  [98.3 F (36.8 C)-99.9 F (37.7 C)] 99.2 F (37.3 C) (02/23 0910) Pulse Rate:  [78-97] 83 (02/23 0910) Resp:  [13-17] 16 (02/23 0920) BP: (109-126)/(50-67) 109/50 (02/23 0920) SpO2:  [95 %-97 %] 95 % (02/23 0910) Weight:  [114.1 kg] 114.1 kg (02/23 0910) Weight change:  Filed Weights   05/25/22 1955 05/26/22 0022 05/27/22 0910  Weight: 116.3 kg 114.3 kg 114.1 kg    Recent Labs  Lab 05/25/22 0517 05/26/22 0513 05/27/22 0427  NA 132*   < > 131*  K 4.1   < > 3.7  CL 95*   < > 95*  CO2 21*   < > 23  GLUCOSE 134*   < > 150*  BUN 94*   < > 69*  CREATININE 9.91*   < > 7.92*  CALCIUM 8.9   < > 9.0  PHOS 6.4*  --   --    < > = values in this interval not displayed.    Recent Labs  Lab 05/24/22 1725 05/25/22 0517 05/26/22 0513 05/27/22 0427  WBC 18.5* 19.3* 15.6* 18.0*  NEUTROABS 16.0*  --   --   --   HGB 7.1* 7.1* 8.1* 8.5*  HCT 20.6* 21.4* 23.9* 25.6*  MCV 88.8 91.8 89.5 92.1  PLT 129* 130* 109* 157    Scheduled Meds:  sodium chloride   Intravenous Once   amLODipine  10 mg Oral QHS   aspirin  81 mg Oral QHS   brimonidine  1 drop Right Eye TID   carvedilol  12.5 mg Oral See admin instructions   Chlorhexidine Gluconate Cloth  6 each Topical Daily   cloNIDine  0.1 mg Oral QHS   clopidogrel  75 mg Oral QHS   folic acid  1 mg Oral QHS   gabapentin  300 mg Oral QHS   heparin  5,000 Units Subcutaneous Q8H   hydrALAZINE  25 mg Oral See admin instructions   insulin aspart  0-5 Units Subcutaneous QHS   insulin aspart  0-6 Units Subcutaneous TID WC   insulin aspart  3 Units Subcutaneous TID WC   insulin glargine-yfgn  10 Units Subcutaneous QHS   rosuvastatin  20 mg Oral QHS   sodium chloride flush  3 mL Intravenous Q12H   Continuous Infusions:   ceFAZolin (ANCEF) IV Stopped (05/25/22 2337)   PRN Meds:.acetaminophen **OR**  acetaminophen, heparin, heparin, ondansetron **OR** ondansetron (ZOFRAN) IV, oxyCODONE    Dialysis Orders: Center: [facility]   on MWF . EDW 116.5kg HD Bath 2K/2.5Ca  Time 4 hours Heparin 2000 units IVP then 1000 units/hr. Access RIJ TDC BFR 400 DFR 500 UF profile 2        Assessment/Plan:  Staph Lugdunensis bacteremia - currently on maxipime, vancomycin, and flagyl.  Repeat blood cultures drawn in ED.  Usually skin and soft tissue infections.  ID recommended TEE and line holiday.  Have consulted Dr. Arnoldo Morale to remove [facility] after HD today, then will need new HD access on Monday.  .  Blood cultures drawn here negative for growth over last 2 days, continue to follow.  Now on cefazolin per pharmacy.  ESRD -  Will plan for HD today to keep on MWF schedule  Hypertension/volume  - stable but below edw.  Will need new lower edw at time of discharge  Anemia  - will dose  ESA and transfuse as needed for Hgb <7  Metabolic bone disease -  continue with home meds  Nutrition - renal diet, carb modified.  Weakness and deconditioning - he lives by himself and would benefit from PT/OT evaluation and possible SNF placement for rehab. Disposition - primary svc put in for transfer to [facility] to help facilitate TEE and [facility] placement on Monday.   Donetta Potts,  MD 05/27/2022, 9:31 AM

## 2022-05-27 NOTE — Procedures (Signed)
  Nephrology Nursing Note:  TEE next week, per primary team.  Needs ECHO today--could be performed during HD.  Also needs line holiday. Arrangements have been made for catheter removal after HD this morning.   Rockwell Alexandria, RN AP KDU

## 2022-05-27 NOTE — Progress Notes (Signed)
Patient in dailysis  at present. [place]

## 2022-05-27 NOTE — Progress Notes (Signed)
*  PRELIMINARY RESULTS* Echocardiogram 2D Echocardiogram has been performed.  [name] 05/27/2022, 5:22 PM

## 2022-05-27 NOTE — Progress Notes (Signed)
   [name] has been requested to perform a transesophageal echocardiogram on [name] Pick. for bacteremia.  After careful review of history and examination, the risks and benefits of transesophageal echocardiogram have been explained including risks of esophageal damage, perforation (1:10,000 risk), bleeding, pharyngeal hematoma as well as other potential complications associated with conscious sedation including aspiration, arrhythmia, respiratory failure and death. Alternatives to treatment were discussed, questions were answered. Patient is willing to proceed.   Pt has a [facility] VVI ICD Additionally it has reached ERI.     [name] Kicks, NP  05/27/2022 12:03 PM

## 2022-05-27 NOTE — Care Management Important Message (Signed)
Important Message  Patient Details  Name: [name]. MRN: [id number] Date of Birth: 09-Mar-1956   Medicare Important Message Given:  Yes     [name] 05/27/2022, 8:52 AM

## 2022-05-28 DIAGNOSIS — R7881 Bacteremia: Secondary | ICD-10-CM | POA: Diagnosis not present

## 2022-05-28 DIAGNOSIS — B957 Other staphylococcus as the cause of diseases classified elsewhere: Secondary | ICD-10-CM

## 2022-05-28 DIAGNOSIS — Z6833 Body mass index (BMI) 33.0-33.9, adult: Secondary | ICD-10-CM

## 2022-05-28 DIAGNOSIS — E6609 Other obesity due to excess calories: Secondary | ICD-10-CM

## 2022-05-28 LAB — CBC WITH DIFFERENTIAL/PLATELET
Abs Immature Granulocytes: 0.39 10*3/uL — ABNORMAL HIGH (ref 0.00–0.07)
Basophils Absolute: 0 10*3/uL (ref 0.0–0.1)
Basophils Relative: 0 %
Eosinophils Absolute: 0.2 10*3/uL (ref 0.0–0.5)
Eosinophils Relative: 1 %
HCT: 24.9 % — ABNORMAL LOW (ref 39.0–52.0)
Hemoglobin: 8.8 g/dL — ABNORMAL LOW (ref 13.0–17.0)
Immature Granulocytes: 2 %
Lymphocytes Relative: 5 %
Lymphs Abs: 1.1 10*3/uL (ref 0.7–4.0)
MCH: 31 pg (ref 26.0–34.0)
MCHC: 35.3 g/dL (ref 30.0–36.0)
MCV: 87.7 fL (ref 80.0–100.0)
Monocytes Absolute: 1.2 10*3/uL — ABNORMAL HIGH (ref 0.1–1.0)
Monocytes Relative: 6 %
Neutro Abs: 17.1 10*3/uL — ABNORMAL HIGH (ref 1.7–7.7)
Neutrophils Relative %: 86 %
Platelets: UNDETERMINED 10*3/uL (ref 150–400)
RBC: 2.84 MIL/uL — ABNORMAL LOW (ref 4.22–5.81)
RDW: 16.8 % — ABNORMAL HIGH (ref 11.5–15.5)
WBC: 20 10*3/uL — ABNORMAL HIGH (ref 4.0–10.5)
nRBC: 0 % (ref 0.0–0.2)

## 2022-05-28 LAB — BASIC METABOLIC PANEL
Anion gap: 16 — ABNORMAL HIGH (ref 5–15)
BUN: 45 mg/dL — ABNORMAL HIGH (ref 8–23)
CO2: 21 mmol/L — ABNORMAL LOW (ref 22–32)
Calcium: 8.9 mg/dL (ref 8.9–10.3)
Chloride: 94 mmol/L — ABNORMAL LOW (ref 98–111)
Creatinine, Ser: 5.99 mg/dL — ABNORMAL HIGH (ref 0.61–1.24)
GFR, Estimated: 10 mL/min — ABNORMAL LOW (ref >60)
Glucose, Bld: 210 mg/dL — ABNORMAL HIGH (ref 70–99)
Potassium: 4.5 mmol/L (ref 3.5–5.1)
Sodium: 131 mmol/L — ABNORMAL LOW (ref 135–145)

## 2022-05-28 LAB — GLUCOSE, CAPILLARY
Glucose-Capillary: 140 mg/dL — ABNORMAL HIGH (ref 70–99)
Glucose-Capillary: 183 mg/dL — ABNORMAL HIGH (ref 70–99)
Glucose-Capillary: 186 mg/dL — ABNORMAL HIGH (ref 70–99)
Glucose-Capillary: 201 mg/dL — ABNORMAL HIGH (ref 70–99)

## 2022-05-28 MED ORDER — CALCIUM ACETATE (PHOS BINDER) 667 MG PO CAPS
1334.0000 mg | ORAL_CAPSULE | Freq: Three times a day (TID) | ORAL | Status: DC
  Administered 2022-05-28 – 2022-06-04 (×14): 1334 mg via ORAL
  Filled 2022-05-28 (×14): qty 2

## 2022-05-28 MED ORDER — DARBEPOETIN ALFA 100 MCG/0.5ML IJ SOSY: 100.0000 ug | PREFILLED_SYRINGE | INTRAMUSCULAR | Status: DC

## 2022-05-28 NOTE — Progress Notes (Signed)
Pt c/o left knee pain and is asking for a heating pad for it. Pt also c/o lower mouth/lip pain. Lower lip appears to be slightly swollen, no injury noted. Pt stated lip/mouth pain started yesterday and rates at 5/10. RN messaged MD on call to make aware.   [name]

## 2022-05-28 NOTE — Assessment & Plan Note (Signed)
-  Associated with ESRD -Baseline 10-12 -Transfused 1 unit PRBC

## 2022-05-28 NOTE — Assessment & Plan Note (Signed)
-  A1c 5.6, good control -Continue glargine -Cover with very sensitive-scale SSI

## 2022-05-28 NOTE — Assessment & Plan Note (Signed)
-  Continue rosuvastatin -Hold Vascepa

## 2022-05-28 NOTE — Progress Notes (Signed)
Progress Note   Patient: [name]. [id number] DOB: September 10, 1955 DOA: 05/24/2022     3 DOS: the patient was seen and examined on 05/28/2022   Brief hospital course: 81 yom w/ hypertension,IDDM,PAD,anemia, pacemaker, and ESRD on HD, sent from dialysis for evaluation of bacteremia.   Patient severely fatigued for the past 1 week, missed dialysis 2/20 for this reason, and was contacted by dialysis personnel, informed that he had an infection in his blood, and was directed to the ED. No fever, chills, cough, dysuria, abdominal pain, vomiting, diarrhea, neck stiffness, or new rash. In [id number] and saturating mid 90s on room air. CT C/A/P notable for mild skin thickening and edema of the abdominal wall without gas or abscess, left renal lesion, right greater than left gynecomastia, and other nonacute findings.   BUN 86, albumin 2.3, WBC 18,500, hemoglobin 7.1, and lactic acid 1.0. Blood cultures pending, started vancomycin/cefepime/Flagyl  Seen by nephrology underwent HD 2/21, blood transfusion for symptomatic anemia.  Blood culture was found to be Staph Lugdunensis bacteremia.  Infectious disease was consulted, continued on Ancef.  Repeat blood cultures are NTD. TTE with EF 45-50%, WMA.  Cardiology Is planning a TEE. Surgery was consulted and removed R IJ permcath.  Assessment and Plan: * Bacteremia -Sent by HD to the ED due to bacteremia from blood cultures on 2/15.   -Per Nephro- blood cx growing S Lugdunesi -On Ancef, ID consulting.   -Line Holiday - surgery removed R IJ permacath -Will need PRN catheter reinserted by IR at [facility] prior to next dialysis (ordered).   -Had echocardiogram, will need TEE (cardiology team will be arranging TEE - given his pacer wire) -Repeat blood culture 2/21- NGTD  Class 1 obesity due to excess calories with body mass index (BMI) of 33.0 to 33.9 in adult -Body mass index is 33.79 kg/m..  -Weight loss should be encouraged -Outpatient  PCP/bariatric medicine f/u encouraged   Gynecomastia -Right more than left  -Incidentally noted on  CT  -Outpatient follow-up and consideration for mammogram is recommended    Kidney lesion, native, left -Incidentally noted on CT -Outpatient MRI recommended   ESRD (end stage renal disease) on dialysis ([place]) -Patient on chronic MWF HD -Nephrology prn order set utilized Nephrology is aware that patient will need HD and is consulting   Anemia -Associated with ESRD -Baseline 10-12 -Transfused 1 unit PRBC  DM (diabetes mellitus), type 2 with peripheral vascular complications (HCC) -123456 5.6, good control -Continue glargine -Cover with very sensitive-scale SSI   Essential hypertension -Continue amlodipine, carvedilol, clonidine, hydralazine  Hyperlipidemia -Continue rosuvastatin -Hold Vascepa     Subjective: Patient reports feeling fatigued but no specific complaints at this time.  Eating lunch, sad that they sent him chicken pie instead of meatloaf  Physical Exam: Vitals:   05/27/22 1906 05/27/22 2133 05/28/22 0350 05/28/22 0947  BP: (!) 146/72 119/72 130/74 (!) 145/67  Pulse: 88 86 87 96  Resp: '18 17 17 18  '$ Temp: 98.9 F (37.2 C) 98.4 F (36.9 C) 99 F (37.2 C) 98.3 F (36.8 C)  TempSrc: Oral Oral Oral   SpO2: 100% 100% 100% 96%  Weight:      Height:       General:  Appears calm and comfortable and is in NAD, sitting up at the bedside Eyes:  EOMI, normal lids, iris ENT:  grossly normal hearing, lips & tongue, mmm Neck:  no LAD, masses or thyromegaly Cardiovascular:  RRR, no m/r/g. No LE edema.  Respiratory:  CTA bilaterally with no wheezes/rales/rhonchi.  Normal respiratory effort. Abdomen:  soft, NT, ND Skin:  no rash or induration seen on limited exam Musculoskeletal:  s/p B BKA Psychiatric:  blunted/mildly somnolent mood and affect, speech fluent and appropriate, AOx3 Neurologic:  CN 2-12 grossly intact, moves all extremities in coordinated  fashion   Radiological Exams on Admission: Independently reviewed - see discussion in A/P where applicable  ECHOCARDIOGRAM COMPLETE  Result Date: 05/27/2022    ECHOCARDIOGRAM REPORT   Patient Name:   [name]. Date of Exam: 05/27/2022 Medical Rec #:  [id number]         Height:       72.0 in Accession #:    [id number]        Weight:       249.1 lb Date of Birth:  02/13/1956         BSA:          2.339 m Patient Age:    67 years          BP:           140/62 mmHg Patient Gender: M                 HR:           83 bpm. Exam Location:  [facility] Procedure: 2D Echo, Cardiac Doppler and Color Doppler Indications:    Bacteremia R78.81  History:        Patient has prior history of Echocardiogram examinations, most                 recent 06/19/2016. CHF and Cardiomyopathy, TIA; Risk                 Factors:Dyslipidemia, Diabetes and Hypertension. ESRD (end stage                 renal disease) on dialysis, COVID-19 virus infection, AICD                 (automatic cardioverter/defibrillator) present (From Hx).  Sonographer:    Alvino Chapel RCS Referring Phys: [id number] [place] IMPRESSIONS  1. Left ventricular ejection fraction, by estimation, is 45 to 50%. The left ventricle has mildly decreased function. The left ventricle demonstrates regional wall motion abnormalities (see scoring diagram/findings for description). There is moderate concentric left ventricular hypertrophy. Left ventricular diastolic parameters are consistent with Grade I diastolic dysfunction (impaired relaxation).  2. Right ventricular systolic function is normal. The right ventricular size is normal. There is normal pulmonary artery systolic pressure.  3. The mitral valve is degenerative. Trivial mitral valve regurgitation.  4. The aortic valve is tricuspid. There is mild calcification of the aortic valve. There is mild thickening of the aortic valve. Aortic valve regurgitation is not visualized. Mild aortic valve stenosis. Aortic valve  mean gradient measures 11.0 mmHg.  5. Aortic dilatation noted. There is mild dilatation of the aortic root, measuring 41 mm.  6. The inferior vena cava is normal in size with greater than 50% respiratory variability, suggesting right atrial pressure of 3 mmHg.  7. Device lead present in RA/RV with thickening and echodensity (approximately 1cm x 2cm) noted on atrial side of tricuspid valve concerning for vegetation in the setting of known bacteremia. TEE indicated for further evaluation as lead extraction may need to be considered. Comparison(s): Prior images unable to be directly viewed. FINDINGS  Left Ventricle: Left ventricular ejection fraction, by estimation, is 45 to 50%. The left ventricle has  mildly decreased function. The left ventricle demonstrates regional wall motion abnormalities. Definity contrast agent was given IV to delineate the left ventricular endocardial borders. The left ventricular internal cavity size was normal in size. There is moderate concentric left ventricular hypertrophy. Left ventricular diastolic parameters are consistent with Grade I diastolic dysfunction (impaired relaxation).  LV Wall Scoring: The basal inferolateral segment, basal inferior segment, and basal inferoseptal segment are hypokinetic. The entire anterior wall, antero-lateral wall, mid and distal lateral wall, entire anterior septum, entire apex, mid and distal inferior wall, and mid inferoseptal segment are normal. Right Ventricle: The right ventricular size is normal. No increase in right ventricular wall thickness. Right ventricular systolic function is normal. There is normal pulmonary artery systolic pressure. The tricuspid regurgitant velocity is 2.20 m/s, and  with an assumed right atrial pressure of 3 mmHg, the estimated right ventricular systolic pressure is [id number] mmHg. Left Atrium: Left atrial size was normal in size. Right Atrium: Right atrial size was normal in size. Pericardium: There is no evidence of  pericardial effusion. Mitral Valve: The mitral valve is degenerative in appearance. There is mild thickening of the mitral valve leaflet(s). There is mild calcification of the mitral valve leaflet(s). Trivial mitral valve regurgitation. Tricuspid Valve: The tricuspid valve is grossly normal. Tricuspid valve regurgitation is mild. Aortic Valve: The aortic valve is tricuspid. There is mild calcification of the aortic valve. There is mild thickening of the aortic valve. There is mild to moderate aortic valve annular calcification. Aortic valve regurgitation is not visualized. Mild aortic stenosis is present. Aortic valve mean gradient measures 11.0 mmHg. Aortic valve peak gradient measures 22.2 mmHg. Aortic valve area, by VTI measures 1.67 cm. Pulmonic Valve: The pulmonic valve was grossly normal. Pulmonic valve regurgitation is trivial. Aorta: Aortic dilatation noted. There is mild dilatation of the aortic root, measuring 41 mm. Venous: The inferior vena cava is normal in size with greater than 50% respiratory variability, suggesting right atrial pressure of 3 mmHg. IAS/Shunts: No atrial level shunt detected by color flow Doppler.  LEFT VENTRICLE PLAX 2D LVIDd:         5.20 cm   Diastology LVIDs:         3.30 cm   LV e' medial:    3.26 cm/s LV PW:         1.40 cm   LV E/e' medial:  27.2 LV IVS:        1.30 cm   LV e' lateral:   9.68 cm/s LVOT diam:     2.20 cm   LV E/e' lateral: 9.2 LV SV:         76 LV SV Index:   33 LVOT Area:     3.80 cm  RIGHT VENTRICLE RV S prime:     15.10 cm/s TAPSE (M-mode): 2.0 cm LEFT ATRIUM             Index        RIGHT ATRIUM           Index LA diam:        4.00 cm 1.71 cm/m   RA Area:     20.60 cm LA Vol (A2C):   64.4 ml 27.53 ml/m  RA Volume:   67.70 ml  28.94 ml/m LA Vol (A4C):   67.5 ml 28.86 ml/m LA Biplane Vol: 71.7 ml 30.65 ml/m  AORTIC VALVE AV Area (Vmax):    1.57 cm AV Area (Vmean):   1.60 cm AV Area (VTI):  1.67 cm AV Vmax:           235.33 cm/s AV Vmean:           154.667 cm/s AV VTI:            0.454 m AV Peak Grad:      22.2 mmHg AV Mean Grad:      11.0 mmHg LVOT Vmax:         97.50 cm/s LVOT Vmean:        65.000 cm/s LVOT VTI:          0.200 m LVOT/AV VTI ratio: 0.44  AORTA Ao Root diam: 4.10 cm MITRAL VALVE                TRICUSPID VALVE MV Area (PHT): 4.80 cm     TR Peak grad:   19.4 mmHg MV Decel Time: 158 msec     TR Vmax:        220.00 cm/s MV E velocity: 88.60 cm/s MV A velocity: 115.50 cm/s  SHUNTS MV E/A ratio:  0.77         Systemic VTI:  0.20 m                             Systemic Diam: 2.20 cm Rozann Lesches MD Electronically signed by Rozann Lesches MD Signature Date/Time: 05/27/2022/5:46:15 PM    Final     EKG: none today   Labs on Admission: I have personally reviewed the available labs and imaging studies at the time of the admission.  Pertinent labs:    Na++ 131 Glucose 210 BN 45/Creatinine 5.99/GFR 10 Anion gap 16 WBC 20 Hgb 8.8  Family Communication: None present; I called his brother, Alhassan Pangburn, and was unable to reach him by telephone  Disposition: Status is: Inpatient Remains inpatient appropriate because: ongoing IV antibiotics and further evaluation/treatment  Planned Discharge Destination: Skilled nursing facility   Time spent: 50 minutes  Author: Karmen Bongo, MD 05/28/2022 4:33 PM  For on call review [email or web address].

## 2022-05-28 NOTE — Consult Note (Signed)
Cardiology Consultation   Patient ID: [name]. MRN: [id number]; DOB: April 13, 1955  Admit date: 05/24/2022 Date of Consult: 05/28/2022  PCP:  [name], MD   [name] Providers Cardiologist:  None   Lovena Le     Patient Profile:   Ove [name]. is a 67 y.o. male with a hx of non-ischemic CM, s/p ICD insertion in 2005 who is being seen 05/28/2022 for the evaluation of Staph bacteremia at the request of Dr. Johnnye Sima.  History of Present Illness:   [name] is a pleasant 67 yo man who I met over 19 years ago. He had a non-ischemic CM, and underwent ICD insertion in 2005. He had a gen change out in 2012. He has developed S. Lugdenesis bacteremia and his HD line has been removed and a TEE is pending. He feels better on IV anti-biotics. He has felt poorly for over a week. He is s/p multiple amputations. He has not had any ICD therapies recently.    Past Medical History:  Diagnosis Date   AICD (automatic cardioverter/defibrillator) present    Anemia    Arthritis    Cerebrovascular disease    CHF (congestive heart failure) (HCC)    Chronic kidney disease    ESRD, MWF HD   COVID 2022   moderate   Diabetes mellitus    type II   History of TIAs    Hyperlipidemia    Hypertension    Nonischemic cardiomyopathy (HCC)    PVD (peripheral vascular disease) (HCC)    s/p B BKA   Shortness of breath dyspnea    with exertion    Skin disease    Rare   Stroke ([place])    "light stroke"   Tobacco abuse     Past Surgical History:  Procedure Laterality Date   A/V FISTULAGRAM Left 06/24/2016   Procedure: A/V Fistulagram;  Surgeon: Elam Dutch, MD;  Location: [facility];  Service: Cardiovascular;  Laterality: Left;   A/V FISTULAGRAM N/A 01/17/2017   Procedure: A/V Fistulagram - left arm;  Surgeon: Serafina Mitchell, MD;  Location: [facility];  Service: Cardiovascular;  Laterality: N/A;   AORTIC ARCH ANGIOGRAPHY N/A 10/21/2021   Procedure: AORTIC ARCH  ANGIOGRAPHY;  Surgeon: Marty Heck, MD;  Location: [facility];  Service: Cardiovascular;  Laterality: N/A;   AV FISTULA PLACEMENT Left 04/08/2015   Procedure: Creation of Left arm BRACHIOCEPHALIC ARTERIOVENOUS  FISTULA ;  Surgeon: Mal Misty, MD;  Location: [facility];  Service: Vascular;  Laterality: Left;   AV FISTULA PLACEMENT Left 10/27/2020   Procedure: LEFT ARM ARTERIOVENOUS (AV) FISTULA CREATION;  Surgeon: Rosetta Posner, MD;  Location: [facility];  Service: Vascular;  Laterality: Left;   AV FISTULA PLACEMENT Left 06/29/2021   Procedure: INSERTION OF LEFT ARM ARTERIOVENOUS (AV) GORE-TEX GRAFT;  Surgeon: Rosetta Posner, MD;  Location: [facility];  Service: Vascular;  Laterality: Left;   [place] Left 01/12/2021   Procedure: LEFT ARM SECOND STAGE BASILIC VEIN TRANSPOSITION;  Surgeon: Rosetta Posner, MD;  Location: [facility];  Service: Vascular;  Laterality: Left;   CARDIAC DEFIBRILLATOR [place] EXTRACTION W/PHACO Right 03/17/2015   Procedure: CATARACT EXTRACTION PHACO AND INTRAOCULAR LENS PLACEMENT ([place]);  Surgeon: Rutherford Guys, MD;  Location: [facility];  Service: Ophthalmology;  Laterality: Right;  CDE: 6.59   EXCHANGE OF A DIALYSIS CATHETER Right 02/08/2022  Procedure: EXCHANGE OF A TUNNELED DIALYSIS CATHETER;  Surgeon: Rosetta Posner, MD;  Location: [facility];  Service: Vascular;  Laterality: Right;   EYE SURGERY Right    Cataract   IR FLUORO GUIDE CV LINE RIGHT  05/26/2021   IR PTA VENOUS EXCEPT DIALYSIS CIRCUIT  05/26/2021   LEG AMPUTATION BELOW KNEE     bilateral   LIGATION ARTERIOVENOUS GORTEX GRAFT Left 09/14/2021   Procedure: LIGATION OF LEFT ARM ARTERIOVENOUS GORTEX GRAFT;  Surgeon: Broadus John, MD;  Location: [facility];  Service: Vascular;  Laterality: Left;  PERIPHERAL NERVE BLOCK   PERIPHERAL VASCULAR INTERVENTION  01/17/2017   Procedure: PERIPHERAL VASCULAR INTERVENTION;  Surgeon: Serafina Mitchell, MD;  Location: [facility];  Service: Cardiovascular;;  PTA  left arm fistula   UPPER EXTREMITY ANGIOGRAPHY N/A 10/21/2021   Procedure: Upper Extremity Angiography;  Surgeon: Marty Heck, MD;  Location: [facility];  Service: Cardiovascular;  Laterality: N/A;   WOUND DEBRIDEMENT Left 02/18/2021   Procedure: LEFT ARM DEBRIDEMENT;  Surgeon: Serafina Mitchell, MD;  [facility];  Service: Vascular;  Laterality: Left;     Home Medications:  Prior to Admission medications   Medication Sig Start Date End Date Taking? Authorizing Provider  amLODipine (NORVASC) 10 MG tablet Take 10 mg by mouth at bedtime. 05/09/17  Yes [provider]  aspirin 81 MG tablet Take 81 mg by mouth at bedtime.   Yes [provider]  brimonidine (ALPHAGAN) 0.2 % ophthalmic solution Place 1 drop into the right eye 3 (three) times daily.   Yes [provider]  calcium acetate (PHOSLO) 667 MG capsule Take 667-1,334 mg by mouth See admin instructions. Take 1,334 mg by mouth three times a day with meals and 667 mg with each snack 03/25/15  Yes [provider]  carvedilol (COREG) 12.5 MG tablet take 1 tablet by mouth twice daily.  DO NOT TAKE IN THE MORNING ON DIALYSIS DAYS Patient taking differently: Take 12.5 mg by mouth See admin instructions. Take 12.5 mg by mouth two times a day and DO NOT TAKE IN THE MORNING ON DIALYSIS DAYS 09/29/20  Yes Evans Lance, MD  cloNIDine (CATAPRES) 0.1 MG tablet Take 1 tablet (0.1 mg total) by mouth at bedtime. Patient taking differently: Take 0.1 mg by mouth 2 (two) times daily. 06/28/16  Yes Domenic Polite, MD  clopidogrel (PLAVIX) 75 MG tablet Take 75 mg by mouth at bedtime.   Yes [provider]  fluticasone (FLONASE) 50 MCG/ACT nasal spray Place 1 spray into both nostrils at bedtime as needed for allergies or rhinitis.   Yes [provider]  folic acid (FOLVITE) 1 MG tablet Take 1 mg by mouth at bedtime.   Yes [provider]   furosemide (LASIX) 20 MG tablet Take 60 mg by mouth 2 (two) times daily.   Yes [provider]  gabapentin (NEURONTIN) 300 MG capsule TAKE 1 CAPSULE(300 MG) BY MOUTH DAILY Patient taking differently: Take 300 mg by mouth at bedtime. 01/07/22  Yes [name], MD  hydrALAZINE (APRESOLINE) 25 MG tablet Take 1 tablet (25 mg total) by mouth in the morning and at bedtime. Patient taking differently: Take 25 mg by mouth See admin instructions. Taking twice daily except on Dialysis on Mon, Wed, Friday only take once daily after dialysis 05/27/21  Yes [name], DO  HYDROcodone-acetaminophen (NORCO/VICODIN) 5-325 MG tablet Take one tab po q 4 hrs prn pain 05/16/22  Yes  Triplett, Tammy, PA-C  icosapent Ethyl (VASCEPA) 1 g capsule Take 2 g by mouth 2 (two) times daily. 04/29/22  Yes [provider]  insulin lispro (HUMALOG) 100 UNIT/ML KiwkPen Inject 15-20 Units into the skin 3 (three) times daily before meals. 15 units on Dialysis days Mon, Wed, Friday   Yes [provider]  LANTUS SOLOSTAR 100 UNIT/ML Solostar Pen Inject 20 Units into the skin at bedtime. Patient taking differently: Inject 50 Units into the skin at bedtime. 05/27/21  Yes Vann, Jessica U, DO  rosuvastatin (CRESTOR) 20 MG tablet Take 20 mg by mouth at bedtime. 02/24/15  Yes [provider]  triamcinolone cream (KENALOG) 0.1 % Apply 1 Application topically 2 (two) times daily.   Yes [provider]  methocarbamol (ROBAXIN) 500 MG tablet Take 1 tablet (500 mg total) by mouth 3 (three) times daily. Patient not taking: Reported on 05/24/2022 05/16/22   Kem Parkinson, PA-C    Inpatient Medications: Scheduled Meds:  sodium chloride   Intravenous Once   amLODipine  10 mg Oral QHS   aspirin  81 mg Oral QHS   brimonidine  1 drop Right Eye TID   carvedilol  12.5 mg Oral See admin instructions   Chlorhexidine Gluconate Cloth  6 each Topical Daily   cloNIDine  0.1 mg Oral QHS    clopidogrel  75 mg Oral QHS   folic acid  1 mg Oral QHS   gabapentin  300 mg Oral QHS   heparin  5,000 Units Subcutaneous Q8H   hydrALAZINE  25 mg Oral See admin instructions   insulin aspart  0-5 Units Subcutaneous QHS   insulin aspart  0-6 Units Subcutaneous TID WC   insulin aspart  3 Units Subcutaneous TID WC   insulin glargine-yfgn  10 Units Subcutaneous QHS   rosuvastatin  20 mg Oral QHS   sodium chloride flush  3 mL Intravenous Q12H   Continuous Infusions:  [START ON 05/30/2022] sodium chloride     albumin human 25 g (05/27/22 0930)    ceFAZolin (ANCEF) IV 2 g (05/27/22 1243)   PRN Meds: acetaminophen **OR** acetaminophen, albumin human, heparin, heparin, ondansetron **OR** ondansetron (ZOFRAN) IV, oxyCODONE  Allergies:    Allergies  Allergen Reactions   Contrast Media [Iodinated Contrast Media] Hives and Other (See Comments)    Happened "in the 80's"   Other Other (See Comments)    "Transfer Dye" = "Makes me tired"   Acetazolamide Anxiety and Other (See Comments)    Jittery, odd feeling (hyper feeling)    Social History:   Social History   Socioeconomic History   Marital status: Legally Separated    Spouse name: Not on file   Number of children: Not on file   Years of education: Not on file   Highest education level: Not on file  Occupational History   Occupation: Disabled    Employer: [employer]  Tobacco Use   Smoking status: Former    Packs/day: 1.00    Years: 20.00    Total pack years: 20.00    Types: Cigarettes    Quit date: 03/12/1998    Years since quitting: 24.2    Passive exposure: Never   Smokeless tobacco: Never  Vaping Use   Vaping Use: Never used  Substance and Sexual Activity   Alcohol use: Not Currently    Comment: occasional   Drug use: No   Sexual activity: Not on file  Other Topics Concern   Not on file  Social History  Narrative   Divorced   No regular exercise   Occasional caffeine use   Social Determinants of Health    Financial Resource Strain: Not on file  Food Insecurity: Food Insecurity Present (05/25/2022)   Hunger Vital Sign    Worried About Running Out of Food in the Last Year: Never true    Ran Out of Food in the Last Year: Sometimes true  Transportation Needs: No Transportation Needs (05/25/2022)   PRAPARE - Hydrologist (Medical): No    Lack of Transportation (Non-Medical): No  Physical Activity: Not on file  Stress: Not on file  Social Connections: Not on file  Intimate Partner Violence: Not At Risk (05/25/2022)   Humiliation, Afraid, Rape, and Kick questionnaire    Fear of Current or Ex-Partner: No    Emotionally Abused: No    Physically Abused: No    Sexually Abused: No    Family History:    Family History  Problem Relation Age of Onset   Diabetes Father    Stroke Father    Diabetes Brother    Diabetes Brother    Diabetes Brother    Diabetes Brother    Heart failure Mother    Diabetes Mother    Diabetes Other    Coronary artery disease Other      ROS:  Please see the history of present illness.   All other ROS reviewed and negative.     Physical Exam/Data:   Vitals:   05/27/22 1906 05/27/22 2133 05/28/22 0350 05/28/22 0947  BP: (!) 146/72 119/72 130/74 (!) 145/67  Pulse: 88 86 87 96  Resp: '18 17 17 18  '$ Temp: 98.9 F (37.2 C) 98.4 F (36.9 C) 99 F (37.2 C) 98.3 F (36.8 C)  TempSrc: Oral Oral Oral   SpO2: 100% 100% 100% 96%  Weight:      Height:        Intake/Output Summary (Last 24 hours) at 05/28/2022 1118 Last data filed at 05/28/2022 0800 Gross per 24 hour  Intake 810 ml  Output 1000 ml  Net -190 ml      05/27/2022    1:23 PM 05/27/2022    9:10 AM 05/26/2022   12:22 AM  Last 3 Weights  Weight (lbs) 249 lb 1.9 oz 251 lb 8.7 oz 251 lb 15.8 oz  Weight (kg) 113 kg 114.1 kg 114.3 kg     Body mass index is 33.79 kg/m.  General: chronically ill appearing, no acute distress HEENT: normal Neck: no JVD Vascular: No carotid  bruits; Distal pulses 2+ bilaterally Cardiac:  normal S1, S2; RRR; no murmur  Lungs:  clear to auscultation bilaterally, no wheezing, rhonchi or rales  Abd: soft, nontender, no hepatomegaly  Ext: s/p bilateral amputations. Musculoskeletal:  No deformities, BUE and BLE strength normal and equal Skin: warm and dry  Neuro:  CNs 2-12 intact, no focal abnormalities noted Psych:  Normal affect   EKG:  The EKG was personally reviewed and demonstrates:  nsr Telemetry:  Telemetry was personally reviewed and demonstrates:  nsr  Relevant CV Studies: See above  Laboratory Data:  High Sensitivity Troponin:  No results for input(s): "TROPONINIHS" in the last 720 hours.   Chemistry Recent Labs  Lab 05/24/22 1725 05/25/22 0517 05/26/22 0513 05/27/22 0427  NA 131* 132* 132* 131*  K 4.0 4.1 3.8 3.7  CL 92* 95* 95* 95*  CO2 22 21* 23 23  GLUCOSE 140* 134* 156* 150*  BUN 86* 94* 56*  69*  CREATININE 9.50* 9.91* 6.44* 7.92*  CALCIUM 9.1 8.9 8.6* 9.0  MG 2.1  --   --   --   GFRNONAA 5* 5* 9* 7*  ANIONGAP 17* 16* 14 13    Recent Labs  Lab 05/24/22 1725 05/25/22 0517  PROT 5.7* 5.5*  ALBUMIN 2.3* 2.1*  AST 47* 33  ALT 28 24  ALKPHOS 71 66  BILITOT 1.3* 1.3*   Lipids No results for input(s): "CHOL", "TRIG", "HDL", "LABVLDL", "LDLCALC", "CHOLHDL" in the last 168 hours.  Hematology Recent Labs  Lab 05/25/22 0517 05/26/22 0513 05/27/22 0427  WBC 19.3* 15.6* 18.0*  RBC 2.33*  2.34* 2.67* 2.78*  HGB 7.1* 8.1* 8.5*  HCT 21.4* 23.9* 25.6*  MCV 91.8 89.5 92.1  MCH 30.5 30.3 30.6  MCHC 33.2 33.9 33.2  RDW 16.6* 16.9* 17.2*  PLT 130* 109* 157   Thyroid No results for input(s): "TSH", "FREET4" in the last 168 hours.  BNP Recent Labs  Lab 05/24/22 1725  BNP 427.0*    DDimer No results for input(s): "DDIMER" in the last 168 hours.   Radiology/Studies:  ECHOCARDIOGRAM COMPLETE  Result Date: 05/27/2022    ECHOCARDIOGRAM REPORT   Patient Name:   Axyl Niemeier. Date of Exam:  05/27/2022 Medical Rec #:  [id number]         Height:       72.0 in Accession #:    [id number]        Weight:       249.1 lb Date of Birth:  02/05/1956         BSA:          2.339 m Patient Age:    89 years          BP:           140/62 mmHg Patient Gender: M                 HR:           83 bpm. Exam Location:  [facility] Procedure: 2D Echo, Cardiac Doppler and Color Doppler Indications:    Bacteremia R78.81  History:        Patient has prior history of Echocardiogram examinations, most                 recent 06/19/2016. CHF and Cardiomyopathy, TIA; Risk                 Factors:Dyslipidemia, Diabetes and Hypertension. ESRD (end stage                 renal disease) on dialysis, COVID-19 virus infection, AICD                 (automatic cardioverter/defibrillator) present (From Hx).  Sonographer:    Alvino Chapel RCS Referring Phys: [id number] [place] IMPRESSIONS  1. Left ventricular ejection fraction, by estimation, is 45 to 50%. The left ventricle has mildly decreased function. The left ventricle demonstrates regional wall motion abnormalities (see scoring diagram/findings for description). There is moderate concentric left ventricular hypertrophy. Left ventricular diastolic parameters are consistent with Grade I diastolic dysfunction (impaired relaxation).  2. Right ventricular systolic function is normal. The right ventricular size is normal. There is normal pulmonary artery systolic pressure.  3. The mitral valve is degenerative. Trivial mitral valve regurgitation.  4. The aortic valve is tricuspid. There is mild calcification of the aortic valve. There is mild thickening of the aortic valve. Aortic valve regurgitation is  not visualized. Mild aortic valve stenosis. Aortic valve mean gradient measures 11.0 mmHg.  5. Aortic dilatation noted. There is mild dilatation of the aortic root, measuring 41 mm.  6. The inferior vena cava is normal in size with greater than 50% respiratory variability, suggesting right atrial  pressure of 3 mmHg.  7. Device lead present in RA/RV with thickening and echodensity (approximately 1cm x 2cm) noted on atrial side of tricuspid valve concerning for vegetation in the setting of known bacteremia. TEE indicated for further evaluation as lead extraction may need to be considered. Comparison(s): Prior images unable to be directly viewed. FINDINGS  Left Ventricle: Left ventricular ejection fraction, by estimation, is 45 to 50%. The left ventricle has mildly decreased function. The left ventricle demonstrates regional wall motion abnormalities. Definity contrast agent was given IV to delineate the left ventricular endocardial borders. The left ventricular internal cavity size was normal in size. There is moderate concentric left ventricular hypertrophy. Left ventricular diastolic parameters are consistent with Grade I diastolic dysfunction (impaired relaxation).  LV Wall Scoring: The basal inferolateral segment, basal inferior segment, and basal inferoseptal segment are hypokinetic. The entire anterior wall, antero-lateral wall, mid and distal lateral wall, entire anterior septum, entire apex, mid and distal inferior wall, and mid inferoseptal segment are normal. Right Ventricle: The right ventricular size is normal. No increase in right ventricular wall thickness. Right ventricular systolic function is normal. There is normal pulmonary artery systolic pressure. The tricuspid regurgitant velocity is 2.20 m/s, and  with an assumed right atrial pressure of 3 mmHg, the estimated right ventricular systolic pressure is [id number] mmHg. Left Atrium: Left atrial size was normal in size. Right Atrium: Right atrial size was normal in size. Pericardium: There is no evidence of pericardial effusion. Mitral Valve: The mitral valve is degenerative in appearance. There is mild thickening of the mitral valve leaflet(s). There is mild calcification of the mitral valve leaflet(s). Trivial mitral valve regurgitation. Tricuspid  Valve: The tricuspid valve is grossly normal. Tricuspid valve regurgitation is mild. Aortic Valve: The aortic valve is tricuspid. There is mild calcification of the aortic valve. There is mild thickening of the aortic valve. There is mild to moderate aortic valve annular calcification. Aortic valve regurgitation is not visualized. Mild aortic stenosis is present. Aortic valve mean gradient measures 11.0 mmHg. Aortic valve peak gradient measures 22.2 mmHg. Aortic valve area, by VTI measures 1.67 cm. Pulmonic Valve: The pulmonic valve was grossly normal. Pulmonic valve regurgitation is trivial. Aorta: Aortic dilatation noted. There is mild dilatation of the aortic root, measuring 41 mm. Venous: The inferior vena cava is normal in size with greater than 50% respiratory variability, suggesting right atrial pressure of 3 mmHg. IAS/Shunts: No atrial level shunt detected by color flow Doppler.  LEFT VENTRICLE PLAX 2D LVIDd:         5.20 cm   Diastology LVIDs:         3.30 cm   LV e' medial:    3.26 cm/s LV PW:         1.40 cm   LV E/e' medial:  27.2 LV IVS:        1.30 cm   LV e' lateral:   9.68 cm/s LVOT diam:     2.20 cm   LV E/e' lateral: 9.2 LV SV:         76 LV SV Index:   33 LVOT Area:     3.80 cm  RIGHT VENTRICLE RV S prime:  15.10 cm/s TAPSE (M-mode): 2.0 cm LEFT ATRIUM             Index        RIGHT ATRIUM           Index LA diam:        4.00 cm 1.71 cm/m   RA Area:     20.60 cm LA Vol (A2C):   64.4 ml 27.53 ml/m  RA Volume:   67.70 ml  28.94 ml/m LA Vol (A4C):   67.5 ml 28.86 ml/m LA Biplane Vol: 71.7 ml 30.65 ml/m  AORTIC VALVE AV Area (Vmax):    1.57 cm AV Area (Vmean):   1.60 cm AV Area (VTI):     1.67 cm AV Vmax:           235.33 cm/s AV Vmean:          154.667 cm/s AV VTI:            0.454 m AV Peak Grad:      22.2 mmHg AV Mean Grad:      11.0 mmHg LVOT Vmax:         97.50 cm/s LVOT Vmean:        65.000 cm/s LVOT VTI:          0.200 m LVOT/AV VTI ratio: 0.44  AORTA Ao Root diam: 4.10 cm MITRAL  VALVE                TRICUSPID VALVE MV Area (PHT): 4.80 cm     TR Peak grad:   19.4 mmHg MV Decel Time: 158 msec     TR Vmax:        220.00 cm/s MV E velocity: 88.60 cm/s MV A velocity: 115.50 cm/s  SHUNTS MV E/A ratio:  0.77         Systemic VTI:  0.20 m                             Systemic Diam: 2.20 cm Rozann Lesches MD Electronically signed by Rozann Lesches MD Signature Date/Time: 05/27/2022/5:46:15 PM    Final    CT CHEST ABDOMEN PELVIS WO CONTRAST  Result Date: 05/24/2022 CLINICAL DATA:  Abdominal pain, acute, nonlocalized. 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 C/o generalized pain, esp on his bottom. Also s/o SOB EXAM: CT CHEST, ABDOMEN AND PELVIS WITHOUT CONTRAST TECHNIQUE: Multidetector CT imaging of the chest, abdomen and pelvis was performed following the standard protocol without IV contrast. RADIATION DOSE REDUCTION: This exam was performed according to the departmental dose-optimization program which includes automated exposure control, adjustment of the mA and/or kV according to patient size and/or use of iterative reconstruction technique. COMPARISON:  None Available. FINDINGS: CT CHEST FINDINGS Cardiovascular: Left chest wall dual lead pacemaker and defibrillator in grossly appropriate position. Normal heart size. No significant pericardial effusion. The thoracic aorta is normal in caliber. Severe atherosclerotic plaque of the thoracic aorta. Four-vessel coronary artery calcifications. Aortic valve leaflet calcification. Cardiac findings suggestive of anemia. The main pulmonary artery is enlarged in caliber measuring up to 3.7 cm. Mediastinum/Nodes: No gross hilar adenopathy, noting limited sensitivity for the detection of hilar adenopathy on this noncontrast study. No enlarged mediastinal, hilar, or axillary lymph nodes. Thyroid gland, trachea, and esophagus demonstrate no  significant findings. Lungs/Pleura: Expiratory phase of respiration. Diffuse mild bronchial wall thickening. Bilateral lower lobe linear  atelectasis versus scarring. No focal consolidation. No pulmonary nodule. No pulmonary mass. No pleural effusion. No pneumothorax. Musculoskeletal: Asymmetric right greater than left gynecomastia. No suspicious lytic or blastic osseous lesions. No acute displaced fracture. Multilevel degenerative changes of the spine. CT ABDOMEN PELVIS FINDINGS Hepatobiliary: No focal liver abnormality. Calcified 3.5 cm gallstone noted within the gallbladder lumen. No gallbladder wall thickening or pericholecystic fluid. No biliary dilatation. Pancreas: No focal lesion. Normal pancreatic contour. No surrounding inflammatory changes. No main pancreatic ductal dilatation. Spleen: Normal in size without focal abnormality. Adrenals/Urinary Tract: No adrenal nodule bilaterally. No nephrolithiasis and no hydronephrosis. Poorly visualized 2.2 cm left renal lesion with a density of 37 Hounsfield units (5:86, 2:65). Fluid density lesions within bilateral kidneys likely represent simple renal cysts. Simple renal cysts, in the absence of clinically indicated signs/symptoms, require no independent follow-up. Subcentimeter hypodensities are too small to characterize-no further follow-up indicated. No ureterolithiasis or hydroureter. The urinary bladder is unremarkable. Stomach/Bowel: Stomach is within normal limits. No evidence of bowel wall thickening or dilatation. Colonic diverticulosis. Appendix appears normal. Vascular/Lymphatic: No abdominal aorta or iliac aneurysm. Severe atherosclerotic plaque of the aorta and its branches. No abdominal, pelvic, or inguinal lymphadenopathy. Reproductive: Prostate is unremarkable. Other: No intraperitoneal free fluid. No intraperitoneal free gas. No organized fluid collection. Musculoskeletal: Small fat containing umbilical hernia. Mild dermal thickening and subcutaneus  soft tissue edema along the anterior abdominal wall (2:111). Mild diffuse subcutaneus soft tissue edema. No suspicious lytic or blastic osseous lesions. No acute displaced fracture. Multilevel degenerative changes of the spine. IMPRESSION: 1. Limited evaluation on this noncontrast study. 2. Mild dermal thickening and subcutaneus soft tissue edema along the anterior abdominal. No subcutaneus soft tissue emphysema. No organized fluid collection. Finding could represent cellulitis. Correlate with physical exam. 3. Enlarged main pulmonary artery suggestive of pulmonary hypertension. 4. Cholelithiasis with no CT finding of acute cholecystitis. 5. Colonic diverticulosis with no acute diverticulitis. 6. Indeterminate 2.2 cm left renal lesion. When the patient is clinically stable and able to follow directions and hold their breath (preferably as an outpatient) further evaluation with dedicated outpatient MRI renal protocol should be considered. 7. Aortic Atherosclerosis (ICD10-I70.0) including coronary artery and aortic valve leaflet calcifications-correlate for aortic stenosis. 8. Asymmetric right greater than left gynecomastia. Correlate with physical exam. Consider mammographic appointment for further evaluation as underlying malignancy cannot be excluded. Electronically Signed   By: Iven Finn M.D.   On: 05/24/2022 20:40   DG Chest 1 View  Result Date: 05/24/2022 CLINICAL DATA:  Shortness of breath EXAM: CHEST  1 VIEW COMPARISON:  02/08/2022 FINDINGS: Right IJ dialysis catheter tip: Cavoatrial junction. AICD noted, unchanged positioning. INSERT or chew Low lung volumes are present, causing crowding of the pulmonary vasculature. Linear bandlike opacity along the right hemidiaphragm peripherally. Indistinct density along the left peripheral hemidiaphragm could be from pericardial adipose tissues or localized airspace opacity along the lingula, and is less striking than on the 02/08/2022 exam. IMPRESSION: 1.  Indistinct density along the left peripheral hemidiaphragm could be from pericardial adipose tissues or localized airspace opacity along the lingula, and is less striking than on the 02/08/2022 exam. 2. Linear bandlike opacity along the right hemidiaphragm peripherally favors atelectasis. Electronically Signed   By: Van Clines M.D.   On: 05/24/2022 17:39     Assessment and Plan:   Staph Lugdenesis bacteremia - Await TEE. Continue IV anti-biotics. I suspect that he will have his ICD lead involved. Extraction would be high risk. Chronic systolic heart failure - he  is euvolemic. Continue HD.    Risk Assessment/Risk Scores:      For questions or updates, please contact [place] Please consult [email or web address] for contact info under    Signed, Cristopher Peru, MD  05/28/2022 11:18 AM

## 2022-05-28 NOTE — Evaluation (Signed)
Occupational Therapy Evaluation Patient Details Name: [name]. MRN: [id number] DOB: 21-Jun-1955 Today's Date: 05/28/2022   History of Present Illness 67 yo male presenting to [facility] ED 2/20 from dialysis with possible infection. Diagnosised with Staph Lugdunensis bacteremia. Catheter removed on 2/23 for a line holiday. PMH including hypertension, insulin-dependent diabetes mellitus, PAD, anemia, and ESRD on hemodialysis.   Clinical Impression   PTA, pt was independence and lives alone; family assisting with grocery shopping and pt used transportation for appointments. Pt reporting he hasn't used his prosthetics recently due to swelling in BLEs. Pt currently requiring mod A for UB ADLs, Max A for LB ADLs, and Mod A for lateral scoot to recliner. Pt presenting with decreased balance, strength, and activity tolerance.  Pt would benefit from further acute OT to facilitate safe dc. Recommend dc to SNF for further OT to optimize safety, independence with ADLs, and return to PLOF.      Recommendations for follow up therapy are one component of a multi-disciplinary discharge planning process, led by the attending physician.  Recommendations may be updated based on patient status, additional functional criteria and insurance authorization.   Follow Up Recommendations  Skilled nursing-short term rehab (<3 hours/day)     Assistance Recommended at Discharge Frequent or constant Supervision/Assistance  Patient can return home with the following A lot of help with bathing/dressing/bathroom    Functional Status Assessment  Patient has had a recent decline in their functional status and demonstrates the ability to make significant improvements in function in a reasonable and predictable amount of time.  Equipment Recommendations  BSC/3in1    Recommendations for Other Services       Precautions / Restrictions Precautions Precautions: Fall      Mobility Bed Mobility Overal bed mobility: Needs  Assistance Bed Mobility: Supine to Sit     Supine to sit: Supervision          Transfers Overall transfer level: Needs assistance   Transfers: Bed to chair/wheelchair/BSC            Lateral/Scoot Transfers: Mod assist        Balance Overall balance assessment: Mild deficits observed, not formally tested                                         ADL either performed or assessed with clinical judgement   ADL Overall ADL's : Needs assistance/impaired Eating/Feeding: Set up;Sitting   Grooming: Set up;Sitting   Upper Body Bathing: Moderate assistance;Sitting   Lower Body Bathing: Maximal assistance;Bed level;Sitting/lateral leans   Upper Body Dressing : Moderate assistance;Sitting   Lower Body Dressing: Maximal assistance;Sitting/lateral leans;Bed level   Toilet Transfer: Moderate assistance;Transfer Geographical information systems officer Details (indicate cue type and reason): lateral scoot to drop arm recliner with Mod A         Functional mobility during ADLs: Moderate assistance (lateral scoot) General ADL Comments: Pt presenting with poor strength, acitvity tolernace, and balance     Vision Baseline Vision/History: 0 No visual deficits       Perception     Praxis      Pertinent Vitals/Pain Pain Assessment Pain Assessment: Faces Faces Pain Scale: Hurts even more Pain Location: neck Pain Descriptors / Indicators: Aching, Sore Pain Intervention(s): Limited activity within patient's tolerance, Monitored during session, Repositioned     Hand Dominance Right   Extremity/Trunk Assessment Upper Extremity Assessment Upper Extremity Assessment:  Generalized weakness (jerky movement patterns in rest)   Lower Extremity Assessment Lower Extremity Assessment: Defer to PT evaluation (bil BKA)   Cervical / Trunk Assessment Cervical / Trunk Assessment: Kyphotic   Communication Communication Communication: No difficulties   Cognition Arousal/Alertness:  Awake/alert (Sleepy) Behavior During Therapy: Flat affect Overall Cognitive Status: Within Functional Limits for tasks assessed                                       General Comments  SpO2 94% on RA and 98% on 2L    Exercises     Shoulder Instructions      Home Living Family/patient expects to be discharged to:: Private residence Living Arrangements: Alone Available Help at Discharge: Family Type of Home: Apartment (Third floor) Home Access: Elevator     Home Layout: One level     Bathroom Shower/Tub: Teacher, early years/pre: Standard     Home Equipment: Conservation officer, nature (2 wheels);Shower seat;Wheelchair - manual          Prior Functioning/Environment Prior Level of Function : Independent/Modified Independent             Mobility Comments: Has prosthetics but does not wear them. Lateral scoots in/out of w/c ADLs Comments: ADLs and cooks/cleans. Son takes him to grocery store. Transportation to/from [employer].        OT Problem List: Decreased strength;Decreased range of motion;Decreased activity tolerance;Impaired balance (sitting and/or standing);Decreased knowledge of use of DME or AE;Decreased knowledge of precautions      OT Treatment/Interventions: Self-care/ADL training;Therapeutic exercise;Energy conservation;DME and/or AE instruction;Therapeutic activities;Patient/family education    OT Goals(Current goals can be found in the care plan section) Acute Rehab OT Goals Patient Stated Goal: Agreeable to rehab, but would liek to get strong enough to go home OT Goal Formulation: With patient Time For Goal Achievement: 06/11/22 Potential to Achieve Goals: Good  OT Frequency: Min 2X/week    Co-evaluation              AM-PAC OT "6 Clicks" Daily Activity     Outcome Measure Help from another person eating meals?: None Help from another person taking care of personal grooming?: A Little Help from another person toileting,  which includes using toliet, bedpan, or urinal?: A Lot Help from another person bathing (including washing, rinsing, drying)?: A Lot Help from another person to put on and taking off regular upper body clothing?: A Lot Help from another person to put on and taking off regular lower body clothing?: A Lot 6 Click Score: 15   End of Session Equipment Utilized During Treatment: Oxygen Nurse Communication: Mobility status  Activity Tolerance: Patient tolerated treatment well Patient left: in chair;with call bell/phone within reach;with chair alarm set  OT Visit Diagnosis: Unsteadiness on feet (R26.81);Other abnormalities of gait and mobility (R26.89);Muscle weakness (generalized) (M62.81)                Time: [id number] OT Time Calculation (min): 37 min Charges:  OT General Charges $OT Visit: 1 Visit OT Evaluation $OT Eval Moderate Complexity: 1 Mod OT Treatments $Self Care/Home Management : 8-22 mins  Kordell Jafri MSOT, OTR/L Acute Rehab Office: [place] 05/28/2022, 4:06 PM

## 2022-05-28 NOTE — TOC Initial Note (Signed)
Transition of Care (TOC) - Initial/Assessment Note    Patient Details  Name: [name]. MRN: [id number] Date of Birth: 12/20/1955  Transition of Care [facility]) CM/SW Contact:    Milinda Antis, [place] Phone Number: 05/28/2022, 9:45 AM  Clinical Narrative:                 CSW received consult for possible SNF placement at time of discharge. CSW spoke with patient at bedside. Patient reported being from home alone with limited natural supports to assist with ADLs.  Patient expressed understanding of PT recommendation and is agreeable to SNF placement at time of discharge. Patient reports preference for a facility in [place]. CSW discussed insurance authorization process and will provide Medicare SNF ratings list. CSW will send out referrals for review and provide bed offers as available.     Expected Discharge Plan: Skilled Nursing Facility Barriers to Discharge: Continued Medical Work up, SNF Pending bed offer   Patient Goals and CMS Choice   CMS Medicare.gov Compare Post Acute Care list provided to:: Patient Choice offered to / list presented to : Patient      Expected Discharge Plan and Services In-house Referral: Clinical Social Work     Living arrangements for the past 2 months: Apartment                                      Prior Living Arrangements/Services Living arrangements for the past 2 months: Apartment Lives with:: Self Patient language and need for interpreter reviewed:: Yes Do you feel safe going back to the place where you live?: Yes      Need for Family Participation in Patient Care: Yes (Comment) Care giver support system in place?: No (comment)   Criminal Activity/Legal Involvement Pertinent to Current Situation/Hospitalization: No - Comment as needed  Activities of Daily Living Home Assistive Devices/Equipment: Wheelchair ADL Screening (condition at time of admission) Patient's cognitive ability adequate to safely complete daily  activities?: Yes Is the patient deaf or have difficulty hearing?: No Does the patient have difficulty seeing, even when wearing glasses/contacts?: No Does the patient have difficulty concentrating, remembering, or making decisions?: No Patient able to express need for assistance with ADLs?: Yes Does the patient have difficulty dressing or bathing?: Yes Independently performs ADLs?: No Communication: Independent Dressing (OT): Needs assistance Is this a change from baseline?: Change from baseline, expected to last <3days Grooming: Independent Feeding: Independent Bathing: Needs assistance Is this a change from baseline?: Change from baseline, expected to last <3 days Toileting: Needs assistance Is this a change from baseline?: Change from baseline, expected to last <3 days In/Out Bed: Needs assistance Is this a change from baseline?: Change from baseline, expected to last <3 days Walks in Home: Needs assistance Is this a change from baseline?: Pre-admission baseline Does the patient have difficulty walking or climbing stairs?: Yes Weakness of Legs: Left Weakness of Arms/Hands: None  Permission Sought/Granted   Permission granted to share information with : Yes, Verbal Permission Granted     Permission granted to share info w AGENCY: SNFs        Emotional Assessment Appearance:: Appears stated age Attitude/Demeanor/Rapport: Guarded, Apprehensive Affect (typically observed): Adaptable Orientation: : Oriented to Situation, Oriented to  Time, Oriented to Place, Oriented to Self Alcohol / Substance Use: Not Applicable Psych Involvement: No (comment)  Admission diagnosis:  Bacteremia [R78.81] Patient Active Problem List   Diagnosis  Date Noted   Bacteremia 05/24/2022   Kidney lesion, native, left 05/24/2022   Gynecomastia 0000000   Complication associated with dialysis catheter 05/25/2021   COVID-19 virus infection 05/25/2021   ESRD (end stage renal disease) on dialysis  ([place]) 02/18/2021   Anemia 09/15/2016   Abscess 09/09/2016   Abscess of buttock, right 09/08/2016   ESRD (end stage renal disease) ([place]) 09/08/2016   S/P bilateral BKA (below knee amputation) ([place]) 09/08/2016   Anemia due to chronic kidney disease 09/08/2016   Chronic combined systolic (congestive) and diastolic (congestive) heart failure ([place]) 06/20/2016   Acute hypoxemic respiratory failure ([place]) 06/19/2016   Volume overload 06/19/2016   AKI (acute kidney injury) ([place]) 06/19/2016   DM (diabetes mellitus), type 2 with peripheral vascular complications ([place]) 0000000   SOB (shortness of breath)    ACHILLES BURSITIS OR TENDINITIS 09/14/2009   PLANTAR FACIITIS 09/14/2009   TIA 09/07/2009   Peripheral vascular disease ([place]) 01/07/2009   Hyperlipidemia 08/27/2008   Essential hypertension 08/27/2008   PCP:  [name], MD Pharmacy:   [facility] [address] AT [address] [place] [address] Phone: [phone] Fax: [phone]     Social Determinants of Health (SDOH) Social History: SDOH Screenings   Food Insecurity: Food Insecurity Present (05/25/2022)  Housing: Low Risk  (05/25/2022)  Transportation Needs: No Transportation Needs (05/25/2022)  Utilities: Not At Risk (05/25/2022)  Tobacco Use: Medium Risk (05/24/2022)   SDOH Interventions:     Readmission Risk Interventions     No data to display

## 2022-05-28 NOTE — Assessment & Plan Note (Signed)
-  Incidentally noted on CT -Outpatient MRI recommended

## 2022-05-28 NOTE — Assessment & Plan Note (Signed)
-  Patient on chronic MWF HD -Nephrology prn order set utilized Nephrology is aware that patient will need HD and is consulting

## 2022-05-28 NOTE — Assessment & Plan Note (Signed)
-  Sent by HD to the ED due to bacteremia from blood cultures on 2/15.   -Per Nephro- blood cx growing S Lugdunesi -On Ancef, ID consulting.   -Line Holiday - surgery removed R IJ permacath -Will need PRN catheter reinserted by IR at [facility] prior to next dialysis (ordered).   -Had echocardiogram, will need TEE (cardiology team will be arranging TEE - given his pacer wire) -Repeat blood culture 2/21- NGTD

## 2022-05-28 NOTE — Assessment & Plan Note (Signed)
-  Body mass index is 33.79 kg/m..  -Weight loss should be encouraged -Outpatient PCP/bariatric medicine f/u encouraged

## 2022-05-28 NOTE — Assessment & Plan Note (Signed)
-  Continue amlodipine, carvedilol, clonidine, hydralazine

## 2022-05-28 NOTE — Progress Notes (Signed)
Nephrology Progress Note  Subjective:  last HD on 2/23 with 1 kg UF.  His right IJ tunn catheter was removed on 2/23 by surgery for a line holiday.  He feels ok.  States they are planning on a procedure to closer at his heart.     Review of systems:  Denies shortness of breath or chest pain  Denies n/v  Vital signs in last 24 hours:  Temp:  [98.3 F (36.8 C)-99.1 F (37.3 C)] 98.3 F (36.8 C) (02/24 0947) Pulse Rate:  [77-96] 96 (02/24 0947) Resp:  [14-20] 18 (02/24 0947) BP: (116-146)/(60-74) 145/67 (02/24 0947) SpO2:  [95 %-100 %] 96 % (02/24 0947) Weight:  [id number] kg] 113 kg (02/23 1323) Weight change:  Filed Weights   05/26/22 0022 05/27/22 0910 05/27/22 1323  Weight: 114.3 kg 114.1 kg 113 kg   Physical exam:  General adult male in bed in no acute distress HEENT normocephalic atraumatic extraocular movements intact sclera anicteric Neck supple trachea midline Lungs clear to auscultation bilaterally normal work of breathing at rest on room air Heart S1S2 no rub Abdomen soft nontender nondistended Extremities bilateral BKA's.  1+ edema  Psych normal mood and affect Neuro alert and conversant follows commands Access - his RIJ catheter has been removed. Site clean/dry/intact   Recent Labs  Lab 05/25/22 0517 05/26/22 0513 05/27/22 0427  NA 132*   < > 131*  K 4.1   < > 3.7  CL 95*   < > 95*  CO2 21*   < > 23  GLUCOSE 134*   < > 150*  BUN 94*   < > 69*  CREATININE 9.91*   < > 7.92*  CALCIUM 8.9   < > 9.0  PHOS 6.4*  --   --    < > = values in this interval not displayed.    Recent Labs  Lab 05/24/22 1725 05/25/22 0517 05/26/22 0513 05/27/22 0427  WBC 18.5* 19.3* 15.6* 18.0*  NEUTROABS 16.0*  --   --   --   HGB 7.1* 7.1* 8.1* 8.5*  HCT 20.6* 21.4* 23.9* 25.6*  MCV 88.8 91.8 89.5 92.1  PLT 129* 130* 109* 157    Scheduled Meds:  sodium chloride   Intravenous Once   amLODipine  10 mg Oral QHS   aspirin  81 mg Oral QHS   brimonidine  1 drop Right Eye TID    carvedilol  12.5 mg Oral See admin instructions   Chlorhexidine Gluconate Cloth  6 each Topical Daily   cloNIDine  0.1 mg Oral QHS   clopidogrel  75 mg Oral QHS   folic acid  1 mg Oral QHS   gabapentin  300 mg Oral QHS   heparin  5,000 Units Subcutaneous Q8H   hydrALAZINE  25 mg Oral See admin instructions   insulin aspart  0-5 Units Subcutaneous QHS   insulin aspart  0-6 Units Subcutaneous TID WC   insulin aspart  3 Units Subcutaneous TID WC   insulin glargine-yfgn  10 Units Subcutaneous QHS   rosuvastatin  20 mg Oral QHS   sodium chloride flush  3 mL Intravenous Q12H   Continuous Infusions:  [START ON 05/30/2022] sodium chloride     albumin human 25 g (05/27/22 0930)    ceFAZolin (ANCEF) IV 2 g (05/27/22 1243)   PRN Meds:.acetaminophen **OR** acetaminophen, albumin human, heparin, heparin, ondansetron **OR** ondansetron (ZOFRAN) IV, oxyCODONE    Dialysis Orders: Center: [facility]   on MWF . EDW 116.5kg Bath 2K/2.5Ca  Time 4 hours  Access RIJ TDC  BFR 400 DFR 500 UF profile 2    Meds: mircera 75 mcg every 2 weeks (due again on 2/26) Calcitriol 2.25 mcg three times a week Senispar 30 mg in center three times a week Heparin 2000 units IVP then 1000 units/hr.      Assessment/Plan:  Staph Lugdunensis bacteremia - Repeat blood cultures drawn in ED.  Usually skin and soft tissue infections.   ID recommended TEE and line holiday.  Catheter removed on 2/23 for a line holiday  On cefazolin  Blood cultures from 2/20 and 2/21 NGTD    ESRD -   HD per MWF schedule Now with line holiday as above  Consulted IR for a new tunneled catheter for 2/26  I have written order for NPO after 12:01 on 2/26  Hypertension/volume - noted below edw.  Will need new lower edw at time of discharge  Anemia of CKD - called outpatient HD unit - ESA is due again on 2/26.  Increase to aranesp 100 mcg weekly on Mondays    Metabolic bone disease -  resume home phoslo  Weakness and deconditioning -  he lives by himself and would benefit from PT/OT evaluation and possible SNF placement for rehab.  Disposition - continue inpatient monitoring.  Currently on a line holiday   Claudia Desanctis, MD 05/28/2022, 12:06 PM

## 2022-05-28 NOTE — NC FL2 (Signed)
[place] LEVEL OF CARE FORM     IDENTIFICATION  Patient Name: [name]. Birthdate: Feb 12, 1956 Sex: male Admission Date (Current Location): 05/24/2022  Air[place] and [place] Number:  Herbalist and Address:  [facility], [place] [address] [place], [place] [address]      Provider Number: [id number]  Attending Physician Name and Address:  Karmen Bongo, MD  Relative Name and Phone Number:  Gunner, [place]al[place]ano (Brother) [phone]    Current Level of Care: Hospital Recommended Level of Care: [place] Prior Approval Number:    Date Approved/Denied:   PASRR Number: [id number]  Discharge Plan: SNF    Current Diagnoses: Patient Active Problem List   Diagnosis Date Noted   Bacteremia 05/24/2022   Kidney lesion, native, left 05/24/2022   Gynecomastia 0000000   Complication associated with dialysis catheter 05/25/2021   COVID-19 virus infection 05/25/2021   ESRD (end stage renal disease) on dialysis ([place]) 02/18/2021   Anemia 09/15/2016   Abscess 09/09/2016   Abscess of buttock, right 09/08/2016   ESRD (end stage renal disease) ([place]) 09/08/2016   S/P bilateral BKA (below knee amputation) ([place]) 09/08/2016   Anemia due to chronic kidney disease 09/08/2016   Chronic combined systolic (congestive) and diastolic (congestive) heart failure ([place]) 06/20/2016   Acute hypoxemic respiratory failure ([place]) 06/19/2016   Volume overload 06/19/2016   AKI (acute kidney injury) ([place]) 06/19/2016   DM (diabetes mellitus), type 2 with peripheral vascular complications ([place]) 0000000   SOB (shortness of breath)    ACHILLES BURSITIS OR TENDINITIS 09/14/2009   PLANTAR FACIITIS 09/14/2009   TIA 09/07/2009   Peripheral vascular disease ([place]) 01/07/2009   Hyperlipidemia 08/27/2008   Essential hypertension 08/27/2008    Orientation RESPIRATION BLADDER Height & Weight     Self, Time, Situation, Place  O2 (nasal cannula 2L)  Incontinent Weight: 249 lb 1.9 oz (113 kg) Height:  6' (182.9 cm)  BEHAVIORAL SYMPTOMS/MOOD NEUROLOGICAL BOWEL NUTRITION STATUS      Continent Diet (see d/c summary)  AMBULATORY STATUS COMMUNICATION OF NEEDS Skin   Total Care Verbally Normal                       Personal Care Assistance Level of Assistance  Bathing, Feeding, Dressing Bathing Assistance: Limited assistance Feeding assistance: Independent Dressing Assistance: Limited assistance     Functional Limitations Info  Sight, Hearing, Speech Sight Info: Impaired Hearing Info: Adequate Speech Info: Adequate    SPECIAL CARE FACTORS FREQUENCY  OT (By licensed OT), PT (By licensed PT)     PT Frequency: 5x/ week OT Frequency: 5x/ week            Contractures Contractures Info: Not present    Additional Factors Info  Insulin Sliding Scale, Allergies, Code Status Code Status Info: Full Allergies Info: Contrast Media (Iodinated Contrast Media)  Other  Acetazolamide   Insulin Sliding Scale Info: see d/c medication list       Current Medications (05/28/2022):  This is the current hospital active medication list Current Facility-Administered Medications  Medication Dose Route Frequency Provider Last Rate Last Admin   0.9 %  sodium chloride infusion (Manually program via Guardrails IV Fluids)   Intravenous Once Kc, Maren Beach, MD       [START ON 05/30/2022] 0.9 %  sodium chloride infusion   Intravenous Continuous Isaiah Serge, NP       acetaminophen (TYLENOL) tablet 650 mg  650 mg Oral Q6H  PRN Vianne Bulls, MD   650 mg at 05/26/22 [id number]   Or   acetaminophen (TYLENOL) suppository 650 mg  650 mg Rectal Q6H PRN Opyd, Ilene Qua, MD       albumin human 25 % solution 25 g  25 g Intravenous Q1H PRN Donato Heinz, MD 60 mL/hr at 05/27/22 0930 25 g at 05/27/22 0930   amLODipine (NORVASC) tablet 10 mg  10 mg Oral QHS Vianne Bulls, MD   10 mg at 05/27/22 2153   aspirin chewable tablet 81 mg  81 mg Oral QHS Opyd,  Ilene Qua, MD   81 mg at 05/27/22 2153   brimonidine (ALPHAGAN) 0.2 % ophthalmic solution 1 drop  1 drop Right Eye TID Opyd, Ilene Qua, MD   1 drop at 05/27/22 1505   carvedilol (COREG) tablet 12.5 mg  12.5 mg Oral See admin instructions Opyd, Ilene Qua, MD       ceFAZolin (ANCEF) IVPB 2g/100 mL premix  2 g Intravenous Q M,W,F-HD Antonieta Pert, MD 200 mL/hr at 05/27/22 1243 2 g at 05/27/22 1243   Chlorhexidine Gluconate Cloth 2 % PADS 6 each  6 each Topical Daily Antonieta Pert, MD   6 each at 05/27/22 [id number]   cloNIDine (CATAPRES) tablet 0.1 mg  0.1 mg Oral QHS Opyd, Ilene Qua, MD   0.1 mg at 05/27/22 2152   clopidogrel (PLAVIX) tablet 75 mg  75 mg Oral QHS Vianne Bulls, MD   75 mg at [id number]   folic acid (FOLVITE) tablet 1 mg  1 mg Oral QHS Opyd, Ilene Qua, MD   1 mg at 05/27/22 2151   gabapentin (NEURONTIN) capsule 300 mg  300 mg Oral QHS Opyd, Ilene Qua, MD   300 mg at 05/27/22 2152   heparin injection 1,000 Units  1,000 Units Intracatheter PRN Donato Heinz, MD   1,000 Units at 05/25/22 2024   heparin injection 2,300 Units  20 Units/kg Dialysis PRN Donato Heinz, MD   2,300 Units at 05/25/22 2000   heparin injection 5,000 Units  5,000 Units Subcutaneous Q8H Opyd, Ilene Qua, MD   5,000 Units at 05/28/22 [id number]   hydrALAZINE (APRESOLINE) tablet 25 mg  25 mg Oral See admin instructions Opyd, Ilene Qua, MD       insulin aspart (novoLOG) injection 0-5 Units  0-5 Units Subcutaneous QHS Opyd, Ilene Qua, MD       insulin aspart (novoLOG) injection 0-6 Units  0-6 Units Subcutaneous TID WC Opyd, Ilene Qua, MD   1 Units at 05/27/22 1736   insulin aspart (novoLOG) injection 3 Units  3 Units Subcutaneous TID WC Opyd, Ilene Qua, MD   3 Units at 05/27/22 0823   insulin glargine-yfgn (SEMGLEE) injection 10 Units  10 Units Subcutaneous QHS Opyd, Ilene Qua, MD   10 Units at 05/27/22 2253   ondansetron (ZOFRAN) tablet 4 mg  4 mg Oral Q6H PRN Opyd, Ilene Qua, MD       Or   ondansetron (ZOFRAN) injection  4 mg  4 mg Intravenous Q6H PRN Opyd, Ilene Qua, MD       oxyCODONE (Oxy IR/ROXICODONE) immediate release tablet 5 mg  5 mg Oral Q4H PRN Opyd, Ilene Qua, MD   5 mg at 05/26/22 0944   rosuvastatin (CRESTOR) tablet 20 mg  20 mg Oral QHS Opyd, Ilene Qua, MD   20 mg at 05/27/22 2152   sodium chloride flush (NS) 0.9 % injection 3 mL  3 mL Intravenous Q12H Opyd, Christia Reading  S, MD   3 mL at 05/27/22 2158     Discharge Medications: Please see discharge summary for a list of discharge medications.  Relevant Imaging Results:  Relevant Lab Results:   Additional Information SSN:  [id number]     ESRD on MWF HD @ [address] Shuntel Fishburn, LCSWA

## 2022-05-28 NOTE — Plan of Care (Signed)
  Problem: Education: Goal: Ability to describe self-care measures that may prevent or decrease complications (Diabetes Survival Skills Education) will improve Outcome: Progressing   Problem: Coping: Goal: Ability to adjust to condition or change in health will improve Outcome: Progressing   Problem: Metabolic: Goal: Ability to maintain appropriate glucose levels will improve Outcome: Progressing   Problem: Nutritional: Goal: Maintenance of adequate nutrition will improve Outcome: Progressing

## 2022-05-28 NOTE — Assessment & Plan Note (Signed)
-  Right more than left  -Incidentally noted on  CT  -Outpatient follow-up and consideration for mammogram is recommended

## 2022-05-29 DIAGNOSIS — R7881 Bacteremia: Secondary | ICD-10-CM | POA: Diagnosis not present

## 2022-05-29 LAB — RENAL FUNCTION PANEL
Albumin: 2.1 g/dL — ABNORMAL LOW (ref 3.5–5.0)
Anion gap: 16 — ABNORMAL HIGH (ref 5–15)
BUN: 50 mg/dL — ABNORMAL HIGH (ref 8–23)
CO2: 22 mmol/L (ref 22–32)
Calcium: 9.3 mg/dL (ref 8.9–10.3)
Chloride: 93 mmol/L — ABNORMAL LOW (ref 98–111)
Creatinine, Ser: 7.09 mg/dL — ABNORMAL HIGH (ref 0.61–1.24)
GFR, Estimated: 8 mL/min — ABNORMAL LOW (ref >60)
Glucose, Bld: 183 mg/dL — ABNORMAL HIGH (ref 70–99)
Phosphorus: 4.5 mg/dL (ref 2.5–4.6)
Potassium: 3.8 mmol/L (ref 3.5–5.1)
Sodium: 131 mmol/L — ABNORMAL LOW (ref 135–145)

## 2022-05-29 LAB — CBC WITH DIFFERENTIAL/PLATELET
Abs Immature Granulocytes: 0.15 10*3/uL — ABNORMAL HIGH (ref 0.00–0.07)
Basophils Absolute: 0 10*3/uL (ref 0.0–0.1)
Basophils Relative: 0 %
Eosinophils Absolute: 0.2 10*3/uL (ref 0.0–0.5)
Eosinophils Relative: 1 %
HCT: 24 % — ABNORMAL LOW (ref 39.0–52.0)
Hemoglobin: 8 g/dL — ABNORMAL LOW (ref 13.0–17.0)
Immature Granulocytes: 1 %
Lymphocytes Relative: 6 %
Lymphs Abs: 1.1 10*3/uL (ref 0.7–4.0)
MCH: 31.1 pg (ref 26.0–34.0)
MCHC: 33.3 g/dL (ref 30.0–36.0)
MCV: 93.4 fL (ref 80.0–100.0)
Monocytes Absolute: 1 10*3/uL (ref 0.1–1.0)
Monocytes Relative: 6 %
Neutro Abs: 15.1 10*3/uL — ABNORMAL HIGH (ref 1.7–7.7)
Neutrophils Relative %: 86 %
Platelets: 166 10*3/uL (ref 150–400)
RBC: 2.57 MIL/uL — ABNORMAL LOW (ref 4.22–5.81)
RDW: 17.3 % — ABNORMAL HIGH (ref 11.5–15.5)
WBC: 17.6 10*3/uL — ABNORMAL HIGH (ref 4.0–10.5)
nRBC: 0 % (ref 0.0–0.2)

## 2022-05-29 LAB — CULTURE, BLOOD (ROUTINE X 2)
Culture: NO GROWTH
Culture: NO GROWTH
Special Requests: ADEQUATE
Special Requests: ADEQUATE

## 2022-05-29 LAB — GLUCOSE, CAPILLARY
Glucose-Capillary: 200 mg/dL — ABNORMAL HIGH (ref 70–99)
Glucose-Capillary: 141 mg/dL — ABNORMAL HIGH (ref 70–99)
Glucose-Capillary: 161 mg/dL — ABNORMAL HIGH (ref 70–99)

## 2022-05-29 MED ORDER — CHLORHEXIDINE GLUCONATE CLOTH 2 % EX PADS
6.0000 | MEDICATED_PAD | Freq: Every day | CUTANEOUS | Status: DC
  Administered 2022-05-30 – 2022-06-03 (×5): 6 via TOPICAL

## 2022-05-29 MED ORDER — DARBEPOETIN ALFA 150 MCG/0.3ML IJ SOSY
150.0000 ug | PREFILLED_SYRINGE | INTRAMUSCULAR | Status: DC
  Administered 2022-05-30 – 2022-06-06 (×2): 150 ug via SUBCUTANEOUS
  Filled 2022-05-29 (×2): qty 0.3

## 2022-05-29 NOTE — Progress Notes (Signed)
Pharmacy Antibiotic Note  [name]. is a 67 y.o. male admitted on 05/24/2022 with bacteremia.  Pharmacy consulted on 2/21for cefazolin dosing.  Blood cultures from 2/20 are negative and cultures from 2/21 are NGTD .  Cath tip culture from 2/23 NGTD.  Plan: Continue Cefazolin 2000 mg IV every HD-MWF. Monitor labs, c/s, and patient improvement.  Height: 6' (182.9 cm) Weight: 113 kg (249 lb 1.9 oz) IBW/kg (Calculated) : 77.6  Temp (24hrs), Avg:98.5 F (36.9 C), Min:98.1 F (36.7 C), Max:98.9 F (37.2 C)  Recent Labs  Lab 05/24/22 1725 05/24/22 1952 05/25/22 0517 05/26/22 0513 05/27/22 0427 05/28/22 1151 05/29/22 0409 05/29/22 0414  WBC 18.5*  --  19.3* 15.6* 18.0* 20.0* 17.6*  --   CREATININE 9.50*  --  9.91* 6.44* 7.92* 5.99*  --  7.09*  LATICACIDVEN 1.0 0.9  --   --   --   --   --   --      Estimated Creatinine Clearance: 12.8 mL/min (A) (by C-G formula based on SCr of 7.09 mg/dL (H)).    Allergies  Allergen Reactions   Contrast Media [Iodinated Contrast Media] Hives and Other (See Comments)    Happened "in the 80's"   Other Other (See Comments)    "Transfer Dye" = "Makes me tired"   Acetazolamide Anxiety and Other (See Comments)    Jittery, odd feeling (hyper feeling)    Antimicrobials this admission: Vanco 2/20 >>2/21 Cefepime 2/20 >>2/21 Flagyl 2/20 >>2/21 Cefazolin 2/21>>    Microbiology results: 2/23 cath tip cx: ngtd 2/21  BCx: ngtd 2/21 BCx: ngtd  2/21 MRSA PCR: neg 2/20 Bcx: negative 2/15 Bcx from [place]- staph lugdunensis , sensitive to cefazolin, cipro, clindamycin, and vancomycin    Thank you for allowing pharmacy to be a part of this patient's care. Nicole Cella, RPh Clinical Pharmacist 05/29/2022 2:58 PM

## 2022-05-29 NOTE — Consult Note (Signed)
Chief Complaint: Patient was seen in consultation today for tunneled HD catheter placement.  Referring Physician(s): Harrie Jeans, MD  Supervising Physician: Jacqulynn Cadet  [facility]  History of Present Illness: [name]. is a 67 y.o. male with a past medical history significant for tobacco use, DM, HTN, multiple TIAs, CHF, PAD s/p bilateral BKA, cardiomyopathy s/p AICD placement, ESRD on HD MWF who presented to [facility] ED 05/24/22 with weakness and fatigue x 1 week. He was also found to have bacteremia on blood draw from HD and was instructed to go to the ED (per nephrology note + for staph lugdunensis). He was admitted for sepsis an started on IV antibiotics. He underwent removal of right chest tunneled HD catheter with [facility] general surgery on 2/23 for line holiday. He has been transferred to [facility] and IR has been consulted for replacement of tunneled HD catheter for ongoing HD needs.  Past Medical History:  Diagnosis Date   AICD (automatic cardioverter/defibrillator) present    Anemia    Arthritis    Cerebrovascular disease    CHF (congestive heart failure) (HCC)    Chronic kidney disease    ESRD, MWF HD   COVID 2022   moderate   Diabetes mellitus    type II   History of TIAs    Hyperlipidemia    Hypertension    Nonischemic cardiomyopathy (HCC)    PVD (peripheral vascular disease) (HCC)    s/p B BKA   Shortness of breath dyspnea    with exertion    Skin disease    Rare   Stroke ([place])    "light stroke"   Tobacco abuse     Past Surgical History:  Procedure Laterality Date   A/V FISTULAGRAM Left 06/24/2016   Procedure: A/V Fistulagram;  Surgeon: Elam Dutch, MD;  Location: [place] [facility];  Service: Cardiovascular;  Laterality: Left;   A/V FISTULAGRAM N/A 01/17/2017   Procedure: A/V Fistulagram - left arm;  Surgeon: Serafina Mitchell, MD;  Location: [place] [facility];  Service: Cardiovascular;  Laterality: N/A;   AORTIC ARCH ANGIOGRAPHY  N/A 10/21/2021   Procedure: AORTIC ARCH ANGIOGRAPHY;  Surgeon: Marty Heck, MD;  Location: [place] [facility];  Service: Cardiovascular;  Laterality: N/A;   AV FISTULA PLACEMENT Left 04/08/2015   Procedure: Creation of Left arm BRACHIOCEPHALIC ARTERIOVENOUS  FISTULA ;  Surgeon: Mal Misty, MD;  Location: [place];  Service: Vascular;  Laterality: Left;   AV FISTULA PLACEMENT Left 10/27/2020   Procedure: LEFT ARM ARTERIOVENOUS (AV) FISTULA CREATION;  Surgeon: Rosetta Posner, MD;  Location: [facility];  Service: Vascular;  Laterality: Left;   AV FISTULA PLACEMENT Left 06/29/2021   Procedure: INSERTION OF LEFT ARM ARTERIOVENOUS (AV) GORE-TEX GRAFT;  Surgeon: Rosetta Posner, MD;  Location: [facility];  Service: Vascular;  Laterality: Left;   [place] Left 01/12/2021   Procedure: LEFT ARM SECOND STAGE BASILIC VEIN TRANSPOSITION;  Surgeon: Rosetta Posner, MD;  Location: [facility];  Service: Vascular;  Laterality: Left;   CARDIAC DEFIBRILLATOR [place] EXTRACTION W/PHACO Right 03/17/2015   Procedure: CATARACT EXTRACTION PHACO AND INTRAOCULAR LENS PLACEMENT ([place]);  Surgeon: Rutherford Guys, MD;  Location: [facility];  Service: Ophthalmology;  Laterality: Right;  CDE: 6.59   EXCHANGE OF A DIALYSIS CATHETER Right 02/08/2022   Procedure: EXCHANGE OF A TUNNELED DIALYSIS CATHETER;  Surgeon: Rosetta Posner, MD;  Location: [facility];  Service: Vascular;  Laterality: Right;   EYE SURGERY Right    Cataract   IR FLUORO GUIDE CV LINE RIGHT  05/26/2021   IR PTA VENOUS EXCEPT DIALYSIS CIRCUIT  05/26/2021   LEG AMPUTATION BELOW KNEE     bilateral   LIGATION ARTERIOVENOUS GORTEX GRAFT Left 09/14/2021   Procedure: LIGATION OF LEFT ARM ARTERIOVENOUS GORTEX GRAFT;  Surgeon: Broadus John, MD;  Location: [place];  Service: Vascular;  Laterality: Left;  PERIPHERAL NERVE BLOCK   PERIPHERAL VASCULAR INTERVENTION  01/17/2017   Procedure: PERIPHERAL VASCULAR INTERVENTION;   Surgeon: Serafina Mitchell, MD;  Location: [place] [facility];  Service: Cardiovascular;;  PTA  left arm fistula   UPPER EXTREMITY ANGIOGRAPHY N/A 10/21/2021   Procedure: Upper Extremity Angiography;  Surgeon: Marty Heck, MD;  Location: [place] [facility];  Service: Cardiovascular;  Laterality: N/A;   WOUND DEBRIDEMENT Left 02/18/2021   Procedure: LEFT ARM DEBRIDEMENT;  Surgeon: Serafina Mitchell, MD;  Location: [place];  Service: Vascular;  Laterality: Left;    Allergies: Contrast media [iodinated contrast media], Other, and Acetazolamide  Medications: Prior to Admission medications   Medication Sig Start Date End Date Taking? Authorizing Provider  amLODipine (NORVASC) 10 MG tablet Take 10 mg by mouth at bedtime. 05/09/17  Yes [provider]  aspirin 81 MG tablet Take 81 mg by mouth at bedtime.   Yes [provider]  brimonidine (ALPHAGAN) 0.2 % ophthalmic solution Place 1 drop into the right eye 3 (three) times daily.   Yes [provider]  calcium acetate (PHOSLO) 667 MG capsule Take 667-1,334 mg by mouth See admin instructions. Take 1,334 mg by mouth three times a day with meals and 667 mg with each snack 03/25/15  Yes [provider]  carvedilol (COREG) 12.5 MG tablet take 1 tablet by mouth twice daily.  DO NOT TAKE IN THE MORNING ON DIALYSIS DAYS Patient taking differently: Take 12.5 mg by mouth See admin instructions. Take 12.5 mg by mouth two times a day and DO NOT TAKE IN THE MORNING ON DIALYSIS DAYS 09/29/20  Yes Evans Lance, MD  cloNIDine (CATAPRES) 0.1 MG tablet Take 1 tablet (0.1 mg total) by mouth at bedtime. Patient taking differently: Take 0.1 mg by mouth 2 (two) times daily. 06/28/16  Yes Domenic Polite, MD  clopidogrel (PLAVIX) 75 MG tablet Take 75 mg by mouth at bedtime.   Yes [provider]  fluticasone (FLONASE) 50 MCG/ACT nasal spray Place 1 spray into both nostrils at bedtime as needed for allergies or rhinitis.   Yes  [provider]  folic acid (FOLVITE) 1 MG tablet Take 1 mg by mouth at bedtime.   Yes [provider]  furosemide (LASIX) 20 MG tablet Take 60 mg by mouth 2 (two) times daily.   Yes [provider]  gabapentin (NEURONTIN) 300 MG capsule TAKE 1 CAPSULE(300 MG) BY MOUTH DAILY Patient taking differently: Take 300 mg by mouth at bedtime. 01/07/22  Yes [name], MD  hydrALAZINE (APRESOLINE) 25 MG tablet Take 1 tablet (25 mg total) by mouth in the morning and at bedtime. Patient taking differently: Take 25 mg by mouth See admin instructions. Taking twice daily except on Dialysis on Mon, Wed, Friday only take once daily after dialysis 05/27/21  Yes [name], DO  HYDROcodone-acetaminophen (NORCO/VICODIN) 5-325 MG tablet Take one tab po q 4 hrs prn pain 05/16/22  Yes Triplett, Tammy, PA-C  icosapent Ethyl (VASCEPA)  1 g capsule Take 2 g by mouth 2 (two) times daily. 04/29/22  Yes [provider]  insulin lispro (HUMALOG) 100 UNIT/ML KiwkPen Inject 15-20 Units into the skin 3 (three) times daily before meals. 15 units on Dialysis days Mon, Wed, Friday   Yes [provider]  LANTUS SOLOSTAR 100 UNIT/ML Solostar Pen Inject 20 Units into the skin at bedtime. Patient taking differently: Inject 50 Units into the skin at bedtime. 05/27/21  Yes Vann, Jessica U, DO  rosuvastatin (CRESTOR) 20 MG tablet Take 20 mg by mouth at bedtime. 02/24/15  Yes [provider]  triamcinolone cream (KENALOG) 0.1 % Apply 1 Application topically 2 (two) times daily.   Yes [provider]  methocarbamol (ROBAXIN) 500 MG tablet Take 1 tablet (500 mg total) by mouth 3 (three) times daily. Patient not taking: Reported on 05/24/2022 05/16/22   Kem Parkinson, PA-C     Family History  Problem Relation Age of Onset   Diabetes Father    Stroke Father    Diabetes Brother    Diabetes Brother    Diabetes Brother    Diabetes Brother    Heart failure Mother     Diabetes Mother    Diabetes Other    Coronary artery disease Other     Social History   Socioeconomic History   Marital status: Legally Separated    Spouse name: Not on file   Number of children: Not on file   Years of education: Not on file   Highest education level: Not on file  Occupational History   Occupation: Disabled    Employer: [employer]  Tobacco Use   Smoking status: Former    Packs/day: 1.00    Years: 20.00    Total pack years: 20.00    Types: Cigarettes    Quit date: 03/12/1998    Years since quitting: 24.2    Passive exposure: Never   Smokeless tobacco: Never  Vaping Use   Vaping Use: Never used  Substance and Sexual Activity   Alcohol use: Not Currently    Comment: occasional   Drug use: No   Sexual activity: Not on file  Other Topics Concern   Not on file  Social History Narrative   Divorced   No regular exercise   Occasional caffeine use   Social Determinants of Health   Financial Resource Strain: Not on file  Food Insecurity: Food Insecurity Present (05/25/2022)   Hunger Vital Sign    Worried About Running Out of Food in the Last Year: Never true    Ran Out of Food in the Last Year: Sometimes true  Transportation Needs: No Transportation Needs (05/25/2022)   PRAPARE - Hydrologist (Medical): No    Lack of Transportation (Non-Medical): No  Physical Activity: Not on file  Stress: Not on file  Social Connections: Not on file     Review of Systems: A 12 point ROS discussed and pertinent positives are indicated in the HPI above.  All other systems are negative.  Review of Systems  Constitutional:  Negative for fever.  Respiratory:  Negative for shortness of breath.   Gastrointestinal:  Negative for abdominal pain.  Musculoskeletal:  Negative for back pain.    Vital Signs: BP (!) 136/115 (BP Location: Right Arm)   Pulse 91   Temp 98.9 F (37.2 C) (Oral)   Resp 18   Ht 6' (1.829 m)   Wt 249 lb 1.9  oz (113 kg)  SpO2 94%   BMI 33.79 kg/m   Physical Exam Vitals and nursing note reviewed.  Constitutional:      General: He is not in acute distress.    Comments: Patient somnolent, difficult to arouse even with tactile cues. He has difficulty staying aware during exam.  HENT:     Head: Normocephalic.     Mouth/Throat:     Mouth: Mucous membranes are moist.     Pharynx: Oropharynx is clear. No oropharyngeal exudate or posterior oropharyngeal erythema.  Cardiovascular:     Rate and Rhythm: Normal rate and regular rhythm.  Pulmonary:     Effort: Pulmonary effort is normal.     Breath sounds: Normal breath sounds.  Musculoskeletal:     Comments: Bilateral AKA  Skin:    General: Skin is warm and dry.       Imaging: ECHOCARDIOGRAM COMPLETE  Result Date: 05/27/2022    ECHOCARDIOGRAM REPORT   Patient Name:   [name]. Date of Exam: 05/27/2022 Medical Rec #:  [id number]         Height:       72.0 in Accession #:    [id number]        Weight:       249.1 lb Date of Birth:  09-Jan-1956         BSA:          2.339 m Patient Age:    68 years          BP:           140/62 mmHg Patient Gender: M                 HR:           83 bpm. Exam Location:  [facility] Procedure: 2D Echo, Cardiac Doppler and Color Doppler Indications:    Bacteremia R78.81  History:        Patient has prior history of Echocardiogram examinations, most                 recent 06/19/2016. CHF and Cardiomyopathy, TIA; Risk                 Factors:Dyslipidemia, Diabetes and Hypertension. ESRD (end stage                 renal disease) on dialysis, COVID-19 virus infection, AICD                 (automatic cardioverter/defibrillator) present (From Hx).  Sonographer:    Alvino Chapel RCS Referring Phys: [id number] Marion Harrah IMPRESSIONS  1. Left ventricular ejection fraction, by estimation, is 45 to 50%. The left ventricle has mildly decreased function. The left ventricle demonstrates regional wall motion abnormalities (see scoring  diagram/findings for description). There is moderate concentric left ventricular hypertrophy. Left ventricular diastolic parameters are consistent with Grade I diastolic dysfunction (impaired relaxation).  2. Right ventricular systolic function is normal. The right ventricular size is normal. There is normal pulmonary artery systolic pressure.  3. The mitral valve is degenerative. Trivial mitral valve regurgitation.  4. The aortic valve is tricuspid. There is mild calcification of the aortic valve. There is mild thickening of the aortic valve. Aortic valve regurgitation is not visualized. Mild aortic valve stenosis. Aortic valve mean gradient measures 11.0 mmHg.  5. Aortic dilatation noted. There is mild dilatation of the aortic root, measuring 41 mm.  6. The inferior vena cava is normal in size with greater than 50% respiratory  variability, suggesting right atrial pressure of 3 mmHg.  7. Device lead present in RA/RV with thickening and echodensity (approximately 1cm x 2cm) noted on atrial side of tricuspid valve concerning for vegetation in the setting of known bacteremia. TEE indicated for further evaluation as lead extraction may need to be considered. Comparison(s): Prior images unable to be directly viewed. FINDINGS  Left Ventricle: Left ventricular ejection fraction, by estimation, is 45 to 50%. The left ventricle has mildly decreased function. The left ventricle demonstrates regional wall motion abnormalities. Definity contrast agent was given IV to delineate the left ventricular endocardial borders. The left ventricular internal cavity size was normal in size. There is moderate concentric left ventricular hypertrophy. Left ventricular diastolic parameters are consistent with Grade I diastolic dysfunction (impaired relaxation).  LV Wall Scoring: The basal inferolateral segment, basal inferior segment, and basal inferoseptal segment are hypokinetic. The entire anterior wall, antero-lateral wall, mid and distal  lateral wall, entire anterior septum, entire apex, mid and distal inferior wall, and mid inferoseptal segment are normal. Right Ventricle: The right ventricular size is normal. No increase in right ventricular wall thickness. Right ventricular systolic function is normal. There is normal pulmonary artery systolic pressure. The tricuspid regurgitant velocity is 2.20 m/s, and  with an assumed right atrial pressure of 3 mmHg, the estimated right ventricular systolic pressure is [id number] mmHg. Left Atrium: Left atrial size was normal in size. Right Atrium: Right atrial size was normal in size. Pericardium: There is no evidence of pericardial effusion. Mitral Valve: The mitral valve is degenerative in appearance. There is mild thickening of the mitral valve leaflet(s). There is mild calcification of the mitral valve leaflet(s). Trivial mitral valve regurgitation. Tricuspid Valve: The tricuspid valve is grossly normal. Tricuspid valve regurgitation is mild. Aortic Valve: The aortic valve is tricuspid. There is mild calcification of the aortic valve. There is mild thickening of the aortic valve. There is mild to moderate aortic valve annular calcification. Aortic valve regurgitation is not visualized. Mild aortic stenosis is present. Aortic valve mean gradient measures 11.0 mmHg. Aortic valve peak gradient measures 22.2 mmHg. Aortic valve area, by VTI measures 1.67 cm. Pulmonic Valve: The pulmonic valve was grossly normal. Pulmonic valve regurgitation is trivial. Aorta: Aortic dilatation noted. There is mild dilatation of the aortic root, measuring 41 mm. Venous: The inferior vena cava is normal in size with greater than 50% respiratory variability, suggesting right atrial pressure of 3 mmHg. IAS/Shunts: No atrial level shunt detected by color flow Doppler.  LEFT VENTRICLE PLAX 2D LVIDd:         5.20 cm   Diastology LVIDs:         3.30 cm   LV e' medial:    3.26 cm/s LV PW:         1.40 cm   LV E/e' medial:  27.2 LV IVS:         1.30 cm   LV e' lateral:   9.68 cm/s LVOT diam:     2.20 cm   LV E/e' lateral: 9.2 LV SV:         76 LV SV Index:   33 LVOT Area:     3.80 cm  RIGHT VENTRICLE RV S prime:     15.10 cm/s TAPSE (M-mode): 2.0 cm LEFT ATRIUM             Index        RIGHT ATRIUM           Index LA diam:  4.00 cm 1.71 cm/m   RA Area:     20.60 cm LA Vol (A2C):   64.4 ml 27.53 ml/m  RA Volume:   67.70 ml  28.94 ml/m LA Vol (A4C):   67.5 ml 28.86 ml/m LA Biplane Vol: 71.7 ml 30.65 ml/m  AORTIC VALVE AV Area (Vmax):    1.57 cm AV Area (Vmean):   1.60 cm AV Area (VTI):     1.67 cm AV Vmax:           235.33 cm/s AV Vmean:          154.667 cm/s AV VTI:            0.454 m AV Peak Grad:      22.2 mmHg AV Mean Grad:      11.0 mmHg LVOT Vmax:         97.50 cm/s LVOT Vmean:        65.000 cm/s LVOT VTI:          0.200 m LVOT/AV VTI ratio: 0.44  AORTA Ao Root diam: 4.10 cm MITRAL VALVE                TRICUSPID VALVE MV Area (PHT): 4.80 cm     TR Peak grad:   19.4 mmHg MV Decel Time: 158 msec     TR Vmax:        220.00 cm/s MV E velocity: 88.60 cm/s MV A velocity: 115.50 cm/s  SHUNTS MV E/A ratio:  0.77         Systemic VTI:  0.20 m                             Systemic Diam: 2.20 cm Rozann Lesches MD Electronically signed by Rozann Lesches MD Signature Date/Time: 05/27/2022/5:46:15 PM    Final    CT CHEST ABDOMEN PELVIS WO CONTRAST  Result Date: 05/24/2022 CLINICAL DATA:  Abdominal pain, acute, nonlocalized. 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 C/o generalized pain, esp on his bottom. Also s/o SOB EXAM: CT CHEST, ABDOMEN AND PELVIS WITHOUT CONTRAST TECHNIQUE: Multidetector CT imaging of the chest, abdomen and pelvis was performed following the standard protocol without IV contrast. RADIATION DOSE REDUCTION: This exam was performed according to the departmental dose-optimization program which includes automated exposure  control, adjustment of the mA and/or kV according to patient size and/or use of iterative reconstruction technique. COMPARISON:  None Available. FINDINGS: CT CHEST FINDINGS Cardiovascular: Left chest wall dual lead pacemaker and defibrillator in grossly appropriate position. Normal heart size. No significant pericardial effusion. The thoracic aorta is normal in caliber. Severe atherosclerotic plaque of the thoracic aorta. Four-vessel coronary artery calcifications. Aortic valve leaflet calcification. Cardiac findings suggestive of anemia. The main pulmonary artery is enlarged in caliber measuring up to 3.7 cm. Mediastinum/Nodes: No gross hilar adenopathy, noting limited sensitivity for the detection of hilar adenopathy on this noncontrast study. No enlarged mediastinal, hilar, or axillary lymph nodes. Thyroid gland, trachea, and esophagus demonstrate no significant findings. Lungs/Pleura: Expiratory phase of respiration. Diffuse mild bronchial wall thickening. Bilateral lower lobe linear atelectasis versus scarring. No focal consolidation. No pulmonary nodule. No pulmonary mass. No pleural effusion. No pneumothorax. Musculoskeletal: Asymmetric right greater than left gynecomastia. No suspicious lytic or blastic osseous lesions. No acute displaced fracture. Multilevel degenerative changes of the spine. CT ABDOMEN PELVIS FINDINGS Hepatobiliary: No focal liver abnormality. Calcified  3.5 cm gallstone noted within the gallbladder lumen. No gallbladder wall thickening or pericholecystic fluid. No biliary dilatation. Pancreas: No focal lesion. Normal pancreatic contour. No surrounding inflammatory changes. No main pancreatic ductal dilatation. Spleen: Normal in size without focal abnormality. Adrenals/Urinary Tract: No adrenal nodule bilaterally. No nephrolithiasis and no hydronephrosis. Poorly visualized 2.2 cm left renal lesion with a density of 37 Hounsfield units (5:86, 2:65). Fluid density lesions within bilateral  kidneys likely represent simple renal cysts. Simple renal cysts, in the absence of clinically indicated signs/symptoms, require no independent follow-up. Subcentimeter hypodensities are too small to characterize-no further follow-up indicated. No ureterolithiasis or hydroureter. The urinary bladder is unremarkable. Stomach/Bowel: Stomach is within normal limits. No evidence of bowel wall thickening or dilatation. Colonic diverticulosis. Appendix appears normal. Vascular/Lymphatic: No abdominal aorta or iliac aneurysm. Severe atherosclerotic plaque of the aorta and its branches. No abdominal, pelvic, or inguinal lymphadenopathy. Reproductive: Prostate is unremarkable. Other: No intraperitoneal free fluid. No intraperitoneal free gas. No organized fluid collection. Musculoskeletal: Small fat containing umbilical hernia. Mild dermal thickening and subcutaneus soft tissue edema along the anterior abdominal wall (2:111). Mild diffuse subcutaneus soft tissue edema. No suspicious lytic or blastic osseous lesions. No acute displaced fracture. Multilevel degenerative changes of the spine. IMPRESSION: 1. Limited evaluation on this noncontrast study. 2. Mild dermal thickening and subcutaneus soft tissue edema along the anterior abdominal. No subcutaneus soft tissue emphysema. No organized fluid collection. Finding could represent cellulitis. Correlate with physical exam. 3. Enlarged main pulmonary artery suggestive of pulmonary hypertension. 4. Cholelithiasis with no CT finding of acute cholecystitis. 5. Colonic diverticulosis with no acute diverticulitis. 6. Indeterminate 2.2 cm left renal lesion. When the patient is clinically stable and able to follow directions and hold their breath (preferably as an outpatient) further evaluation with dedicated outpatient MRI renal protocol should be considered. 7. Aortic Atherosclerosis (ICD10-I70.0) including coronary artery and aortic valve leaflet calcifications-correlate for aortic  stenosis. 8. Asymmetric right greater than left gynecomastia. Correlate with physical exam. Consider mammographic appointment for further evaluation as underlying malignancy cannot be excluded. Electronically Signed   By: Iven Finn M.D.   On: 05/24/2022 20:40   DG Chest 1 View  Result Date: 05/24/2022 CLINICAL DATA:  Shortness of breath EXAM: CHEST  1 VIEW COMPARISON:  02/08/2022 FINDINGS: Right IJ dialysis catheter tip: Cavoatrial junction. AICD noted, unchanged positioning. INSERT or chew Low lung volumes are present, causing crowding of the pulmonary vasculature. Linear bandlike opacity along the right hemidiaphragm peripherally. Indistinct density along the left peripheral hemidiaphragm could be from pericardial adipose tissues or localized airspace opacity along the lingula, and is less striking than on the 02/08/2022 exam. IMPRESSION: 1. Indistinct density along the left peripheral hemidiaphragm could be from pericardial adipose tissues or localized airspace opacity along the lingula, and is less striking than on the 02/08/2022 exam. 2. Linear bandlike opacity along the right hemidiaphragm peripherally favors atelectasis. Electronically Signed   By: Van Clines M.D.   On: 05/24/2022 17:39   CT Cervical Spine Wo Contrast  Result Date: 05/16/2022 CLINICAL DATA:  Neck pain since morning after sliding out his wheelchair EXAM: CT CERVICAL SPINE WITHOUT CONTRAST TECHNIQUE: Multidetector CT imaging of the cervical spine was performed without intravenous contrast. Multiplanar CT image reconstructions were also generated. RADIATION DOSE REDUCTION: This exam was performed according to the departmental dose-optimization program which includes automated exposure control, adjustment of the mA and/or kV according to patient size and/or use of iterative reconstruction technique. COMPARISON:  None Available. FINDINGS: Alignment: Straightening  of the cervical spine Skull base and vertebrae: No acute  fracture. No primary bone lesion or focal pathologic process. Soft tissues and spinal canal: No prevertebral fluid or swelling. No visible canal hematoma. Disc levels: C2-C3:  No significant findings C3-C4: Uncovertebral joint arthropathy without significant neural foraminal stenosis C4-C5: Disc height loss and moderate left facet joint arthropathy. Mild left neural foraminal stenosis. C5-C6:  No significant finding C6-C7: Disc height loss and uncovertebral joint arthropathy without significant spinal canal or neural foraminal stenosis C7-T1:  No significant finding Upper chest: Negative. Other: Prominent vertebral artery atherosclerotic calcifications. IMPRESSION: 1. No acute fracture or traumatic subluxation. 2. Mild multilevel degenerative disc disease and uncovertebral joint arthropathy. 3. Prominent vertebral artery atherosclerotic calcifications. Electronically Signed   By: Keane Police D.O.   On: 05/16/2022 19:14    Labs:  CBC: Recent Labs    05/25/22 0517 05/26/22 0513 05/27/22 0427 05/28/22 1151  WBC 19.3* 15.6* 18.0* 20.0*  HGB 7.1* 8.1* 8.5* 8.8*  HCT 21.4* 23.9* 25.6* 24.9*  PLT 130* 109* 157 PLATELET CLUMPS NOTED ON SMEAR, UNABLE TO ESTIMATE    COAGS: Recent Labs    05/24/22 1725  INR 1.3*    BMP: Recent Labs    05/26/22 0513 05/27/22 0427 05/28/22 1151 05/29/22 0414  NA 132* 131* 131* 131*  K 3.8 3.7 4.5 3.8  CL 95* 95* 94* 93*  CO2 23 23 21* 22  GLUCOSE 156* 150* 210* 183*  BUN 56* 69* 45* 50*  CALCIUM 8.6* 9.0 8.9 9.3  CREATININE 6.44* 7.92* 5.99* 7.09*  GFRNONAA 9* 7* 10* 8*    LIVER FUNCTION TESTS: Recent Labs    05/24/22 1725 05/25/22 0517 05/29/22 0414  BILITOT 1.3* 1.3*  --   AST 47* 33  --   ALT 28 24  --   ALKPHOS 71 66  --   PROT 5.7* 5.5*  --   ALBUMIN 2.3* 2.1* 2.1*    TUMOR MARKERS: No results for input(s): "AFPTM", "CEA", "CA199", "CHROMGRNA" in the last 8760 hours.  Assessment and Plan:  67 y/o M with history of ESRD on HD MWF  via right chest tunneled HD catheter found to have bacteremia and underwent HD catheter removal 2/23 by [facility] general surgery for line holiday. He has been transferred to [facility] for cardiology evaluation and IR has been consulted for new tunneled HD catheter placement.   Patient afebrile, WBC 17.6 but repeat blood cultures 2/21 NGTD. Potential ICD lead infection per cardiology, TEE planned for tomorrow to evaluate.  Patient very somnolent on exam today, he quickly falls back asleep and is difficult to arouse despite loud verbal cues and tactile cues. He tells me he understands that he is planned to get a dialysis catheter tomorrow but has difficulty staying awake long enough to review the risks - as such will hold off on signing consent until more alert.  Plan: - Given leukocytosis with negative blood cultures and potential ICD lead involvement per cardiology will need to review case with performing IR physician AM of 2/26 to determine if patient qualifies for tunneled line vs temporary line. Tentatively planned for procedure 2/26 in IR pending any emergent procedures which may delay case. He will need consent form signed prior to procedure. Patient is also scheduled for TEE on 2/26 so will need to coordinate timing around that. - NPO at midnight. Continue to give meds with sips of water. - SQ heparin held starting 2/26 @ 0600. Of note patient also on Plavix 75 mg +  ASA 81 mg which do not require hold. - IR will call for patient when ready.   IR will follow up with patient tomorrow to review risks and sign consent.  Thank you for this interesting consult.  I greatly enjoyed meeting Audre Vandine. and look forward to participating in their care.  A copy of this report was sent to the requesting provider on this date.  Electronically Signed: Joaquim Nam, PA-C 05/29/2022, 10:17 AM   I spent a total of 40 Minutes in face to face in clinical consultation, greater than 50% of which was  counseling/coordinating care for tunneled HD catheter placement.

## 2022-05-29 NOTE — Progress Notes (Signed)
Progress Note   Patient: [name]. [id number] DOB: 01/26/1956 DOA: 05/24/2022     4 DOS: the patient was seen and examined on 05/29/2022   Brief hospital course: 59 yom w/ hypertension,IDDM,PAD,anemia, pacemaker, and ESRD on HD, sent from dialysis for evaluation of bacteremia.   Patient severely fatigued for the past 1 week, missed dialysis 2/20 for this reason, and was contacted by dialysis personnel, informed that he had an infection in his blood, and was directed to the ED. No fever, chills, cough, dysuria, abdominal pain, vomiting, diarrhea, neck stiffness, or new rash. In [id number] and saturating mid 90s on room air. CT C/A/P notable for mild skin thickening and edema of the abdominal wall without gas or abscess, left renal lesion, right greater than left gynecomastia, and other nonacute findings.   BUN 86, albumin 2.3, WBC 18,500, hemoglobin 7.1, and lactic acid 1.0. Blood cultures NTD, started vancomycin/cefepime/Flagyl  Seen by nephrology underwent HD 2/21, blood transfusion for symptomatic anemia.  Blood culture was found to be Staph Lugdunensis bacteremia.  Infectious disease was consulted, continued on Ancef.  Repeat blood cultures are NTD. TTE with EF 45-50%, WMA.  Cardiology Is planning a TEE on 2/27.  Concern for ICD lead involvement, but extraction would be high risk. Surgery was consulted and removed R IJ permcath. IR is planning to place a new tunneled catheter on 2/26. Likely to need SNF rehab at the time of discharge.  Assessment and Plan: * Bacteremia -Sent by HD to the ED due to bacteremia from blood cultures on 2/15.   -Per Nephro- blood cx growing S Lugdunesi -On Ancef, ID consulting.   -Line Holiday - surgery removed R IJ permacath -Tunneled catheter to be placed by IR tomorrow (2/26).   -Had echocardiogram, TEE scheduled for 2/27 (concern for lead involvement, extraction would be high risk) -Repeat blood culture 2/21- NGTD  Class 1 obesity due to  excess calories with body mass index (BMI) of 33.0 to 33.9 in adult -Body mass index is 33.79 kg/m..  -Weight loss should be encouraged -Outpatient PCP/bariatric medicine f/u encouraged   Gynecomastia -Right more than left  -Incidentally noted on  CT  -Outpatient follow-up and consideration for mammogram is recommended    Kidney lesion, native, left -Incidentally noted on CT -Outpatient MRI recommended   ESRD (end stage renal disease) on dialysis ([place]) -Patient on chronic MWF HD -Nephrology prn order set utilized -Due for HD again tomorrow post-catheter placement  Anemia -Associated with ESRD -Baseline 10-12 -Transfused 1 unit PRBC  DM (diabetes mellitus), type 2 with peripheral vascular complications (HCC) -123456 5.6, good control -Continue glargine -Cover with very sensitive-scale SSI   Essential hypertension -Continue amlodipine, carvedilol, clonidine, hydralazine  Hyperlipidemia -Continue rosuvastatin -Hold Vascepa    Subjective: Patient was somnolent today, did not report concerns.  He was at home PTA but acknowledged that he will need to go to SNF Rehab at the time of dc.  Physical Exam: Vitals:   05/28/22 1746 05/28/22 2113 05/29/22 0518 05/29/22 0847  BP: (!) 141/71 (!) 133/55 (!) 111/57 (!) 136/115  Pulse: 88 78 86 91  Resp: '16 16 16 18  '$ Temp: 98.5 F (36.9 C) 98.6 F (37 C) 98.1 F (36.7 C) 98.9 F (37.2 C)  TempSrc:  Oral  Oral  SpO2: 95% 93% 95% 94%  Weight:      Height:       General:  Appears calm and comfortable and is in NAD Eyes:  EOMI, normal lids, iris ENT:  grossly normal hearing, lips & tongue, mmm Neck:  no LAD, masses or thyromegaly Cardiovascular:  RRR, no m/r/g. No LE edema.  Respiratory:   CTA bilaterally with no wheezes/rales/rhonchi.  Normal respiratory effort. Abdomen:  soft, NT, ND Skin:  no rash or induration seen on limited exam Musculoskeletal:  s/p B BKA Psychiatric:  somnolent mood and affect, speech sparse but  appropriate Neurologic:  CN 2-12 grossly intact, moves all extremities in coordinated fashion, limited by somnolence     Radiological Exams on Admission: Independently reviewed - see discussion in A/P where applicable  ECHOCARDIOGRAM COMPLETE  Result Date: 05/27/2022    ECHOCARDIOGRAM REPORT   Patient Name:   [name]. Date of Exam: 05/27/2022 Medical Rec #:  [id number]         Height:       72.0 in Accession #:    [id number]        Weight:       249.1 lb Date of Birth:  1955/05/20         BSA:          2.339 m Patient Age:    67 years          BP:           140/62 mmHg Patient Gender: M                 HR:           83 bpm. Exam Location:  [facility] Procedure: 2D Echo, Cardiac Doppler and Color Doppler Indications:    Bacteremia R78.81  History:        Patient has prior history of Echocardiogram examinations, most                 recent 06/19/2016. CHF and Cardiomyopathy, TIA; Risk                 Factors:Dyslipidemia, Diabetes and Hypertension. ESRD (end stage                 renal disease) on dialysis, COVID-19 virus infection, AICD                 (automatic cardioverter/defibrillator) present (From Hx).  Sonographer:    Alvino Chapel RCS Referring Phys: [id number] Berth[place] IMPRESSIONS  1. Left ventricular ejection fraction, by estimation, is 45 to 50%. The left ventricle has mildly decreased function. The left ventricle demonstrates regional wall motion abnormalities (see scoring diagram/findings for description). There is moderate concentric left ventricular hypertrophy. Left ventricular diastolic parameters are consistent with Grade I diastolic dysfunction (impaired relaxation).  2. Right ventricular systolic function is normal. The right ventricular size is normal. There is normal pulmonary artery systolic pressure.  3. The mitral valve is degenerative. Trivial mitral valve regurgitation.  4. The aortic valve is tricuspid. There is mild calcification of the aortic valve. There is mild  thickening of the aortic valve. Aortic valve regurgitation is not visualized. Mild aortic valve stenosis. Aortic valve mean gradient measures 11.0 mmHg.  5. Aortic dilatation noted. There is mild dilatation of the aortic root, measuring 41 mm.  6. The inferior vena cava is normal in size with greater than 50% respiratory variability, suggesting right atrial pressure of 3 mmHg.  7. Device lead present in RA/RV with thickening and echodensity (approximately 1cm x 2cm) noted on atrial side of tricuspid valve concerning for vegetation in the setting of known bacteremia. TEE indicated for further evaluation as lead extraction may need  to be considered. Comparison(s): Prior images unable to be directly viewed. FINDINGS  Left Ventricle: Left ventricular ejection fraction, by estimation, is 45 to 50%. The left ventricle has mildly decreased function. The left ventricle demonstrates regional wall motion abnormalities. Definity contrast agent was given IV to delineate the left ventricular endocardial borders. The left ventricular internal cavity size was normal in size. There is moderate concentric left ventricular hypertrophy. Left ventricular diastolic parameters are consistent with Grade I diastolic dysfunction (impaired relaxation).  LV Wall Scoring: The basal inferolateral segment, basal inferior segment, and basal inferoseptal segment are hypokinetic. The entire anterior wall, antero-lateral wall, mid and distal lateral wall, entire anterior septum, entire apex, mid and distal inferior wall, and mid inferoseptal segment are normal. Right Ventricle: The right ventricular size is normal. No increase in right ventricular wall thickness. Right ventricular systolic function is normal. There is normal pulmonary artery systolic pressure. The tricuspid regurgitant velocity is 2.20 m/s, and  with an assumed right atrial pressure of 3 mmHg, the estimated right ventricular systolic pressure is [id number] mmHg. Left Atrium: Left atrial  size was normal in size. Right Atrium: Right atrial size was normal in size. Pericardium: There is no evidence of pericardial effusion. Mitral Valve: The mitral valve is degenerative in appearance. There is mild thickening of the mitral valve leaflet(s). There is mild calcification of the mitral valve leaflet(s). Trivial mitral valve regurgitation. Tricuspid Valve: The tricuspid valve is grossly normal. Tricuspid valve regurgitation is mild. Aortic Valve: The aortic valve is tricuspid. There is mild calcification of the aortic valve. There is mild thickening of the aortic valve. There is mild to moderate aortic valve annular calcification. Aortic valve regurgitation is not visualized. Mild aortic stenosis is present. Aortic valve mean gradient measures 11.0 mmHg. Aortic valve peak gradient measures 22.2 mmHg. Aortic valve area, by VTI measures 1.67 cm. Pulmonic Valve: The pulmonic valve was grossly normal. Pulmonic valve regurgitation is trivial. Aorta: Aortic dilatation noted. There is mild dilatation of the aortic root, measuring 41 mm. Venous: The inferior vena cava is normal in size with greater than 50% respiratory variability, suggesting right atrial pressure of 3 mmHg. IAS/Shunts: No atrial level shunt detected by color flow Doppler.  LEFT VENTRICLE PLAX 2D LVIDd:         5.20 cm   Diastology LVIDs:         3.30 cm   LV e' medial:    3.26 cm/s LV PW:         1.40 cm   LV E/e' medial:  27.2 LV IVS:        1.30 cm   LV e' lateral:   9.68 cm/s LVOT diam:     2.20 cm   LV E/e' lateral: 9.2 LV SV:         76 LV SV Index:   33 LVOT Area:     3.80 cm  RIGHT VENTRICLE RV S prime:     15.10 cm/s TAPSE (M-mode): 2.0 cm LEFT ATRIUM             Index        RIGHT ATRIUM           Index LA diam:        4.00 cm 1.71 cm/m   RA Area:     20.60 cm LA Vol (A2C):   64.4 ml 27.53 ml/m  RA Volume:   67.70 ml  28.94 ml/m LA Vol (A4C):   67.5 ml 28.86 ml/m LA Biplane  Vol: 71.7 ml 30.65 ml/m  AORTIC VALVE AV Area (Vmax):     1.57 cm AV Area (Vmean):   1.60 cm AV Area (VTI):     1.67 cm AV Vmax:           235.33 cm/s AV Vmean:          154.667 cm/s AV VTI:            0.454 m AV Peak Grad:      22.2 mmHg AV Mean Grad:      11.0 mmHg LVOT Vmax:         97.50 cm/s LVOT Vmean:        65.000 cm/s LVOT VTI:          0.200 m LVOT/AV VTI ratio: 0.44  AORTA Ao Root diam: 4.10 cm MITRAL VALVE                TRICUSPID VALVE MV Area (PHT): 4.80 cm     TR Peak grad:   19.4 mmHg MV Decel Time: 158 msec     TR Vmax:        220.00 cm/s MV E velocity: 88.60 cm/s MV A velocity: 115.50 cm/s  SHUNTS MV E/A ratio:  0.77         Systemic VTI:  0.20 m                             Systemic Diam: 2.20 cm Rozann Lesches MD Electronically signed by Rozann Lesches MD Signature Date/Time: 05/27/2022/5:46:15 PM    Final     EKG: not done today   Labs on Admission: I have personally reviewed the available labs and imaging studies at the time of the admission.  Pertinent labs:    Na++ 131 Glucose 183 BUN 50/Creatinine 7.09/GFR 8 Anion gap 16 Albumin 2.1  Family Communication: None present; I attempted to reach his brother by telephone today but was unsuccessful  Disposition: Status is: Inpatient Remains inpatient appropriate because: needs tunneled HD catheter (tomorrow), TEE (2/27)  Planned Discharge Destination: Skilled nursing facility    Time spent: 35 minutes  Author: Karmen Bongo, MD 05/29/2022 4:06 PM  For on call review [email or web address].

## 2022-05-29 NOTE — Progress Notes (Signed)
Physical Therapy Treatment Patient Details Name: [name]. MRN: [id number] DOB: [birth year] Today's Date: 05/29/2022   History of Present Illness 67 yo male presenting to [facility] ED 2/20 from dialysis with possible infection. Diagnosised with Staph Lugdunensis bacteremia. Catheter removed on 2/23 for a line holiday. PMH bil BKA, HTN, insulin-dependent diabetes mellitus, PAD, anemia, and ESRD on hemodialysis.    PT Comments    Pt now at [facility] and PT continued. Worked on lateral scooting on EOB as pt found recliner yesterday very uncomfortable on buttocks. Pt continues to complain of neck pain. Performed some soft tissue work and instructed in some ex's as well as placing heat. Continue to recommend SNF as pt lives alone and needs to be independent prior to return home.    Recommendations for follow up therapy are one component of a multi-disciplinary discharge planning process, led by the attending physician.  Recommendations may be updated based on patient status, additional functional criteria and insurance authorization.  Follow Up Recommendations  Skilled nursing-short term rehab (<3 hours/day) Can patient physically be transported by private vehicle: No   Assistance Recommended at Discharge Frequent or constant Supervision/Assistance  Patient can return home with the following A little help with walking and/or transfers;A little help with bathing/dressing/bathroom;Assist for transportation;Help with stairs or ramp for entrance   Equipment Recommendations  None recommended by PT    Recommendations for Other Services       Precautions / Restrictions Precautions Precautions: Fall     Mobility  Bed Mobility Overal bed mobility: Needs Assistance Bed Mobility: Sit to Sidelying         Sit to sidelying: Min assist, HOB elevated General bed mobility comments: Assist to bring legs up into bed    Transfers                  Lateral/Scoot Transfers: Mod assist General  transfer comment: Lateral scoot on EOB using pad underneath hips    Ambulation/Gait                   Stairs             Wheelchair Mobility    Modified Rankin (Stroke Patients Only)       Balance Overall balance assessment: Needs assistance Sitting-balance support: No upper extremity supported, Feet unsupported Sitting balance-Leahy Scale: Good                                      Cognition Arousal/Alertness: Awake/alert Behavior During Therapy: WFL for tasks assessed/performed Overall Cognitive Status: Within Functional Limits for tasks assessed                                          Exercises Other Exercises Other Exercises: Isometric rt lateral cervical rotation x 5 Other Exercises: AROM for rt lateral cervical rotation x 5    General Comments        Pertinent Vitals/Pain Pain Assessment Pain Assessment: 0-10 Pain Score: 7  Pain Location: neck Pain Descriptors / Indicators: Aching, Sore Pain Intervention(s): Limited activity within patient's tolerance, Repositioned, Utilized relaxation techniques, Heat applied    Home Living                          Prior Function  PT Goals (current goals can now be found in the care plan section) Acute Rehab PT Goals Patient Stated Goal: go to rehab Progress towards PT goals: Progressing toward goals    Frequency    Min 3X/week      PT Plan      Co-evaluation              AM-PAC PT "6 Clicks" Mobility   Outcome Measure  Help needed turning from your back to your side while in a flat bed without using bedrails?: A Little Help needed moving from lying on your back to sitting on the side of a flat bed without using bedrails?: A Little Help needed moving to and from a bed to a chair (including a wheelchair)?: A Lot Help needed standing up from a chair using your arms (e.g., wheelchair or bedside chair)?: Total Help needed to walk in  hospital room?: Total Help needed climbing 3-5 steps with a railing? : Total 6 Click Score: 11    End of Session   Activity Tolerance: Patient tolerated treatment well Patient left: in bed;with call bell/phone within reach;with bed alarm set   PT Visit Diagnosis: Other abnormalities of gait and mobility (R26.89);Muscle weakness (generalized) (M62.81)     Time: [id number] PT Time Calculation (min) (ACUTE ONLY): 13 min  Charges:  $Therapeutic Activity: 8-22 mins                     [place] 05/29/2022, 9:24 AM

## 2022-05-29 NOTE — Progress Notes (Signed)
Nephrology Progress Note  Subjective:  last HD on 2/23 with 1 kg UF.  He is anuric.  He feels ok today.  He has no questions.  Understands plan for tunneled line placement tomorrow for dialysis.    Review of systems:   Denies shortness of breath or chest pain  Denies n/v  Vital signs in last 24 hours:  Temp:  [98.1 F (36.7 C)-98.9 F (37.2 C)] 98.9 F (37.2 C) (02/25 0847) Pulse Rate:  [78-91] 91 (02/25 0847) Resp:  [16-18] 18 (02/25 0847) BP: (111-141)/(55-115) 136/115 (02/25 0847) SpO2:  [93 %-95 %] 94 % (02/25 0847) Weight change:  Filed Weights   05/26/22 0022 05/27/22 0910 05/27/22 1323  Weight: 114.3 kg 114.1 kg 113 kg   Physical exam:  General adult male in bed in no acute distress HEENT normocephalic atraumatic extraocular movements intact sclera anicteric Neck supple trachea midline Lungs clear to auscultation bilaterally normal work of breathing at rest on room air Heart S1S2 no rub Abdomen soft nontender nondistended Extremities bilateral BKA's.  Trace to 1+ edema of residual limbs Psych normal mood and affect Neuro awake and conversant follows commands Access - his RIJ catheter has been removed. Site clean/dry/intact   Recent Labs  Lab 05/29/22 0414  NA 131*  K 3.8  CL 93*  CO2 22  GLUCOSE 183*  BUN 50*  CREATININE 7.09*  CALCIUM 9.3  PHOS 4.5    Recent Labs  Lab 05/24/22 1725 05/25/22 0517 05/27/22 0427 05/28/22 1151 05/29/22 0409  WBC 18.5*   < > 18.0* 20.0* 17.6*  NEUTROABS 16.0*  --   --  17.1* 15.1*  HGB 7.1*   < > 8.5* 8.8* 8.0*  HCT 20.6*   < > 25.6* 24.9* 24.0*  MCV 88.8   < > 92.1 87.7 93.4  PLT 129*   < > 157 PLATELET CLUMPS NOTED ON SMEAR, UNABLE TO ESTIMATE 166   < > = values in this interval not displayed.    Scheduled Meds:  sodium chloride   Intravenous Once   amLODipine  10 mg Oral QHS   aspirin  81 mg Oral QHS   brimonidine  1 drop Right Eye TID   calcium acetate  1,334 mg Oral TID WC   carvedilol  12.5 mg Oral See  admin instructions   Chlorhexidine Gluconate Cloth  6 each Topical Daily   cloNIDine  0.1 mg Oral QHS   clopidogrel  75 mg Oral QHS   [START ON 05/30/2022] darbepoetin (ARANESP) injection - NON-DIALYSIS  100 mcg Subcutaneous Q [id number]   folic acid  1 mg Oral QHS   gabapentin  300 mg Oral QHS   heparin  5,000 Units Subcutaneous Q8H   hydrALAZINE  25 mg Oral See admin instructions   insulin aspart  0-5 Units Subcutaneous QHS   insulin aspart  0-6 Units Subcutaneous TID WC   insulin aspart  3 Units Subcutaneous TID WC   insulin glargine-yfgn  10 Units Subcutaneous QHS   rosuvastatin  20 mg Oral QHS   sodium chloride flush  3 mL Intravenous Q12H   Continuous Infusions:  [START ON 05/30/2022] sodium chloride     albumin human 25 g (05/27/22 0930)    ceFAZolin (ANCEF) IV 2 g (05/27/22 1243)   PRN Meds:.acetaminophen **OR** acetaminophen, albumin human, heparin, heparin, ondansetron **OR** ondansetron (ZOFRAN) IV, oxyCODONE    Dialysis Orders:  Center: [facility]  on MWF . EDW 116.5kg Bath 2K/2.5Ca   Time 4 hours  Access RIJ  TDC  BFR 400 DFR 500 UF profile 2    Meds: mircera 75 mcg every 2 weeks (due again on 2/26) Calcitriol 2.25 mcg three times a week Senispar 30 mg in center three times a week Heparin 2000 units IVP then 1000 units/hr.      Assessment/Plan:  Staph Lugdunensis bacteremia - Repeat blood cultures drawn in ED.  Usually skin and soft tissue infections.   ID recommended TEE and line holiday.  Catheter removed on 2/23 for a line holiday  Cardiology is following and a TEE is planned On cefazolin  Blood cultures from 2/20 are negative and cultures from 2/21 are NGTD    ESRD -   HD per MWF schedule Now with line holiday as above  I have consulted IR for a new tunneled catheter for 2/26  I have written order for NPO after 12:01 on 2/26  Hypertension/volume - noted below edw.  Will need new lower edw at time of discharge  Anemia of CKD - ESA is due again on  2/26.  Increase to aranesp 150 mcg weekly on Mondays    Metabolic bone disease -  resumed home phoslo  Weakness and deconditioning - he lives by himself and would benefit from PT/OT evaluation and possible SNF placement for rehab.  Disposition - continue inpatient monitoring.  Currently on a line holiday   Claudia Desanctis, MD 05/29/2022, 11:37 AM

## 2022-05-30 ENCOUNTER — Inpatient Hospital Stay (HOSPITAL_COMMUNITY): Payer: Medicare Other

## 2022-05-30 DIAGNOSIS — B957 Other staphylococcus as the cause of diseases classified elsewhere: Secondary | ICD-10-CM | POA: Diagnosis not present

## 2022-05-30 DIAGNOSIS — R7881 Bacteremia: Secondary | ICD-10-CM | POA: Diagnosis not present

## 2022-05-30 HISTORY — PX: IR FLUORO GUIDE CV LINE RIGHT: IMG2283

## 2022-05-30 HISTORY — PX: IR US GUIDE VASC ACCESS RIGHT: IMG2390

## 2022-05-30 LAB — CBC WITH DIFFERENTIAL/PLATELET
Abs Immature Granulocytes: 0.17 10*3/uL — ABNORMAL HIGH (ref 0.00–0.07)
Basophils Absolute: 0 10*3/uL (ref 0.0–0.1)
Basophils Relative: 0 %
Eosinophils Absolute: 0.2 10*3/uL (ref 0.0–0.5)
Eosinophils Relative: 1 %
HCT: 24.1 % — ABNORMAL LOW (ref 39.0–52.0)
Hemoglobin: 7.7 g/dL — ABNORMAL LOW (ref 13.0–17.0)
Immature Granulocytes: 1 %
Lymphocytes Relative: 7 %
Lymphs Abs: 1.1 10*3/uL (ref 0.7–4.0)
MCH: 29.7 pg (ref 26.0–34.0)
MCHC: 32 g/dL (ref 30.0–36.0)
MCV: 93.1 fL (ref 80.0–100.0)
Monocytes Absolute: 0.8 10*3/uL (ref 0.1–1.0)
Monocytes Relative: 6 %
Neutro Abs: 12.5 10*3/uL — ABNORMAL HIGH (ref 1.7–7.7)
Neutrophils Relative %: 85 %
Platelets: 184 10*3/uL (ref 150–400)
RBC: 2.59 MIL/uL — ABNORMAL LOW (ref 4.22–5.81)
RDW: 16.8 % — ABNORMAL HIGH (ref 11.5–15.5)
WBC: 14.8 10*3/uL — ABNORMAL HIGH (ref 4.0–10.5)
nRBC: 0 % (ref 0.0–0.2)

## 2022-05-30 LAB — CULTURE, BLOOD (ROUTINE X 2)
Culture: NO GROWTH
Culture: NO GROWTH

## 2022-05-30 LAB — FOLATE: Folate: 34.5 ng/mL (ref >5.9)

## 2022-05-30 LAB — IRON AND TIBC
Iron: 39 ug/dL — ABNORMAL LOW (ref 45–182)
Saturation Ratios: 33 % (ref 17.9–39.5)
TIBC: 119 ug/dL — ABNORMAL LOW (ref 250–450)
UIBC: 80 ug/dL

## 2022-05-30 LAB — RENAL FUNCTION PANEL
Albumin: 2.1 g/dL — ABNORMAL LOW (ref 3.5–5.0)
Anion gap: 15 (ref 5–15)
BUN: 57 mg/dL — ABNORMAL HIGH (ref 8–23)
CO2: 23 mmol/L (ref 22–32)
Calcium: 9.5 mg/dL (ref 8.9–10.3)
Chloride: 90 mmol/L — ABNORMAL LOW (ref 98–111)
Creatinine, Ser: 8.03 mg/dL — ABNORMAL HIGH (ref 0.61–1.24)
GFR, Estimated: 7 mL/min — ABNORMAL LOW (ref >60)
Glucose, Bld: 178 mg/dL — ABNORMAL HIGH (ref 70–99)
Phosphorus: 5.1 mg/dL — ABNORMAL HIGH (ref 2.5–4.6)
Potassium: 4.1 mmol/L (ref 3.5–5.1)
Sodium: 128 mmol/L — ABNORMAL LOW (ref 135–145)

## 2022-05-30 LAB — RETICULOCYTES
Immature Retic Fract: 22.6 % — ABNORMAL HIGH (ref 2.3–15.9)
RBC.: 2.86 MIL/uL — ABNORMAL LOW (ref 4.22–5.81)
Retic Count, Absolute: 99.2 10*3/uL (ref 19.0–186.0)
Retic Ct Pct: 3.5 % — ABNORMAL HIGH (ref 0.4–3.1)

## 2022-05-30 LAB — PROTIME-INR
INR: 1.3 — ABNORMAL HIGH (ref 0.8–1.2)
Prothrombin Time: 15.6 seconds — ABNORMAL HIGH (ref 11.4–15.2)

## 2022-05-30 LAB — CATH TIP CULTURE: Culture: NO GROWTH

## 2022-05-30 LAB — DIC (DISSEMINATED INTRAVASCULAR COAGULATION)PANEL
D-Dimer, Quant: 2.26 ug/mL-FEU — ABNORMAL HIGH (ref 0.00–0.50)
Fibrinogen: >800 mg/dL — ABNORMAL HIGH (ref 210–475)
INR: 1.2 (ref 0.8–1.2)
Platelets: 195 10*3/uL (ref 150–400)
Prothrombin Time: 15 seconds (ref 11.4–15.2)
Smear Review: NONE SEEN
aPTT: 34 seconds (ref 24–36)

## 2022-05-30 LAB — GLUCOSE, CAPILLARY
Glucose-Capillary: 150 mg/dL — ABNORMAL HIGH (ref 70–99)
Glucose-Capillary: 152 mg/dL — ABNORMAL HIGH (ref 70–99)
Glucose-Capillary: 159 mg/dL — ABNORMAL HIGH (ref 70–99)
Glucose-Capillary: 196 mg/dL — ABNORMAL HIGH (ref 70–99)
Glucose-Capillary: 204 mg/dL — ABNORMAL HIGH (ref 70–99)

## 2022-05-30 LAB — LACTATE DEHYDROGENASE: LDH: 258 U/L — ABNORMAL HIGH (ref 98–192)

## 2022-05-30 LAB — VITAMIN B12: Vitamin B-12: 288 pg/mL (ref 180–914)

## 2022-05-30 LAB — FERRITIN: Ferritin: 3315 ng/mL — ABNORMAL HIGH (ref 24–336)

## 2022-05-30 MED ORDER — FENTANYL CITRATE (PF) 100 MCG/2ML IJ SOLN
INTRAMUSCULAR | Status: AC | PRN
  Administered 2022-05-30 (×2): 25 ug via INTRAVENOUS

## 2022-05-30 MED ORDER — DEXTROSE 5 % IV SOLN: INTRAVENOUS | Status: AC

## 2022-05-30 MED ORDER — INSULIN ASPART 100 UNIT/ML IJ SOLN
3.0000 [IU] | Freq: Three times a day (TID) | INTRAMUSCULAR | Status: DC
  Administered 2022-05-30 – 2022-06-06 (×17): 3 [IU] via SUBCUTANEOUS

## 2022-05-30 MED ORDER — CEFAZOLIN SODIUM-DEXTROSE 2-4 GM/100ML-% IV SOLN
2.0000 g | INTRAVENOUS | Status: DC
  Administered 2022-06-02: 2 g via INTRAVENOUS
  Filled 2022-05-30 (×2): qty 100

## 2022-05-30 MED ORDER — CEFAZOLIN SODIUM-DEXTROSE 2-4 GM/100ML-% IV SOLN
2.0000 g | Freq: Once | INTRAVENOUS | Status: AC
  Administered 2022-05-30: 2 g via INTRAVENOUS
  Filled 2022-05-30: qty 100

## 2022-05-30 MED ORDER — HEPARIN SODIUM (PORCINE) 1000 UNIT/ML IJ SOLN
INTRAMUSCULAR | Status: AC
  Filled 2022-05-30: qty 10

## 2022-05-30 MED ORDER — CALCITRIOL 0.5 MCG PO CAPS
2.2500 ug | ORAL_CAPSULE | ORAL | Status: DC
  Administered 2022-05-30 – 2022-06-06 (×4): 2.25 ug via ORAL
  Filled 2022-05-30 (×5): qty 1

## 2022-05-30 MED ORDER — MIDAZOLAM HCL 2 MG/2ML IJ SOLN
INTRAMUSCULAR | Status: AC
  Filled 2022-05-30: qty 2

## 2022-05-30 MED ORDER — CINACALCET HCL 30 MG PO TABS
30.0000 mg | ORAL_TABLET | ORAL | Status: DC
  Administered 2022-05-30 – 2022-06-06 (×4): 30 mg via ORAL
  Filled 2022-05-30 (×5): qty 1

## 2022-05-30 MED ORDER — LIDOCAINE HCL 1 % IJ SOLN
INTRAMUSCULAR | Status: AC
  Administered 2022-05-30: 12 mL
  Filled 2022-05-30: qty 20

## 2022-05-30 MED ORDER — MIDAZOLAM HCL 2 MG/2ML IJ SOLN
INTRAMUSCULAR | Status: AC | PRN
  Administered 2022-05-30: 1 mg via INTRAVENOUS

## 2022-05-30 MED ORDER — PANTOPRAZOLE SODIUM 40 MG PO TBEC
40.0000 mg | DELAYED_RELEASE_TABLET | Freq: Two times a day (BID) | ORAL | Status: DC
  Administered 2022-05-30 – 2022-06-07 (×14): 40 mg via ORAL
  Filled 2022-05-30 (×15): qty 1

## 2022-05-30 MED ORDER — FENTANYL CITRATE (PF) 100 MCG/2ML IJ SOLN
INTRAMUSCULAR | Status: AC
  Filled 2022-05-30: qty 2

## 2022-05-30 NOTE — Progress Notes (Signed)
PROGRESS NOTE                                                                                                                                                                                                             Patient Demographics:    [name], is a 67 y.o. male, DOB - Sep 13, 1955, [id number]  Outpatient Primary MD for the patient is [name], MD    LOS - 5  Admit date - 05/24/2022    Chief Complaint  Patient presents with   Shortness of Breath       Brief Narrative (HPI from H&P)   80 yom w/ hypertension,IDDM,PAD,anemia, pacemaker, and ESRD on HD, sent from dialysis for evaluation of bacteremia.   Patient severely fatigued for the past 1 week, missed dialysis 2/20 for this reason, and was contacted by dialysis personnel, informed that he had an infection in his blood, and was directed to the ED. No fever, chills, cough, dysuria, abdominal pain, vomiting, diarrhea, neck stiffness, or new rash. In [id number] and saturating mid 90s on room air. CT C/A/P notable for mild skin thickening and edema of the abdominal wall without gas or abscess, left renal lesion, right greater than left gynecomastia, and other nonacute findings.    He was admitted to the hospital for bacteremia in the presence of dialysis tunneled catheter, AICD, seen by nephrology, ID and EP.  Currently on IV antibiotics awaiting further workup.  He was transferred under my care on 05/30/2022 on day 5 of hospital stay.   Subjective:    Shella Maxim today has, No headache, No chest pain, No abdominal pain - No Nausea, No new weakness tingling or numbness, no SOB   Assessment  & Plan :     Bacteremia with HD center blood cx growing [place] with history of HD catheter and AICD device-  Sent by HD to the ED due to bacteremia from blood cultures on 2/15, underwent echocardiogram showing - Device lead present in RA/RV with thickening and echodensity  (approximately 1cm x 2cm) noted on atrial side of tricuspid valve concerning for vegetation in the setting of known bacteremia.   ID monitoring remotely, renal and EP following have requested ID to do a formal consult on 05/30/2022.  Underwent tunneled catheter removal by IR on 05/27/2022, new catheter to be placed on 05/30/2022 after  line holidays.  On IV Ancef, repeat cultures thus far negative appears nontoxic, TEE pending.  Per EP high risk for AICD removal.  Will request ID to guide further treatment plan.   Class 1 obesity due to excess calories with body mass index (BMI) of 33.0 to 33.9 in adult -Body mass index is 33.79 kg/m..  -Weight loss should be encouraged -Outpatient PCP/bariatric medicine f/u encouraged   Gynecomastia -Right more than left  -Incidentally noted on  CT  -Outpatient follow-up and consideration for mammogram is recommended    Kidney lesion, native, left -Incidentally noted on CT -Outpatient MRI recommended   ESRD (end stage renal disease) on dialysis ([facility]) -Patient on chronic MWF HD -Nephrology prn order set utilized -Due for HD again tomorrow post-catheter placement  Anemia -Associated with ESRD -However H&H serially dropping, no Kainen blood in stool, Hemoccult stool, DIC panel, LDH and haptoglobin, placed on PPI twice daily and monitor H&H closely.  Type screen done, no signs of brisk ongoing bleeding.  DM (diabetes mellitus), type 2 with peripheral vascular complications (HCC) -123456 5.6, good control -Continue glargine -Cover with very sensitive-scale SSI   CBG (last 3)  Recent Labs    05/29/22 2118 05/30/22 0618 05/30/22 0728  GLUCAP 161* 150* 152*     Essential hypertension -Continue amlodipine, carvedilol, clonidine, hydralazine  Hyperlipidemia -Continue rosuvastatin -Hold Vascepa      Condition - Extremely Guarded  Family Communication  :  None  Code Status :  Full  Consults  :  ID, EP, Renal, IR  PUD Prophylaxis : PPI    Procedures  :     TEE  TTE - 1. Left ventricular ejection fraction, by estimation, is 45 to 50%. The left ventricle has mildly decreased function. The left ventricle demonstrates regional wall motion abnormalities (see scoring diagram/findings for description). There is moderate concentric left ventricular hypertrophy. Left ventricular diastolic parameters are consistent with Grade I diastolic dysfunction (impaired relaxation).  2. Right ventricular systolic function is normal. The right ventricular size is normal. There is normal pulmonary artery systolic pressure.  3. The mitral valve is degenerative. Trivial mitral valve regurgitation.  4. The aortic valve is tricuspid. There is mild calcification of the aortic valve. There is mild thickening of the aortic valve. Aortic valve regurgitation is not visualized. Mild aortic valve stenosis. Aortic valve mean gradient measures 11.0 mmHg.  5. Aortic dilatation noted. There is mild dilatation of the aortic root, measuring 41 mm.  6. The inferior vena cava is normal in size with greater than 50% respiratory variability, suggesting right atrial pressure of 3 mmHg.  7. Device lead present in RA/RV with thickening and echodensity (approximately 1cm x 2cm) noted on atrial side of tricuspid valve concerning for vegetation in the setting of known bacteremia. TEE indicated for further evaluation as lead extraction may need to be considered. Comparison(s): Prior images unable to be directly viewed.       Disposition Plan  :    Status is: Inpatient  DVT Prophylaxis  :    heparin injection 5,000 Units Start: 05/25/22 0600   Lab Results  Component Value Date   PLT 195 05/30/2022    Diet :  Diet Order             Diet NPO time specified  Diet effective midnight                    Inpatient Medications  Scheduled Meds:  sodium chloride   Intravenous Once  amLODipine  10 mg Oral QHS   aspirin  81 mg Oral QHS   brimonidine  1 drop Right Eye TID    calcium acetate  1,334 mg Oral TID WC   carvedilol  12.5 mg Oral See admin instructions   Chlorhexidine Gluconate Cloth  6 each Topical Daily   Chlorhexidine Gluconate Cloth  6 each Topical Q0600   cloNIDine  0.1 mg Oral QHS   clopidogrel  75 mg Oral QHS   darbepoetin (ARANESP) injection - NON-DIALYSIS  150 mcg Subcutaneous Q [id number]   folic acid  1 mg Oral QHS   gabapentin  300 mg Oral QHS   heparin  5,000 Units Subcutaneous Q8H   hydrALAZINE  25 mg Oral See admin instructions   insulin aspart  0-6 Units Subcutaneous TID WC   [START ON 05/31/2022] insulin aspart  3 Units Subcutaneous TID WC   insulin glargine-yfgn  10 Units Subcutaneous QHS   pantoprazole  40 mg Oral BID   rosuvastatin  20 mg Oral QHS   sodium chloride flush  3 mL Intravenous Q12H   Continuous Infusions:  sodium chloride     albumin human 25 g (05/27/22 0930)    ceFAZolin (ANCEF) IV 2 g (05/27/22 1243)   dextrose 50 mL/hr at 05/30/22 0620   PRN Meds:.acetaminophen **OR** acetaminophen, albumin human, heparin, heparin, ondansetron **OR** ondansetron (ZOFRAN) IV, oxyCODONE   Objective:   Vitals:   05/29/22 0847 05/29/22 1736 05/29/22 2059 05/30/22 0500  BP: (!) 136/115 (!) 151/97 (!) 175/68 (!) 149/69  Pulse: 91 84 84 81  Resp: '18 17 18 16  '$ Temp: 98.9 F (37.2 C) 98.3 F (36.8 C) 98.9 F (37.2 C) 98.9 F (37.2 C)  TempSrc: Oral Oral Oral Oral  SpO2: 94% 97% 100% 100%  Weight:      Height:        Wt Readings from Last 3 Encounters:  05/27/22 113 kg  05/16/22 117.9 kg  02/08/22 117.9 kg     Intake/Output Summary (Last 24 hours) at 05/30/2022 0855 Last data filed at 05/30/2022 0800 Gross per 24 hour  Intake 640 ml  Output 0 ml  Net 640 ml     Physical Exam  Awake Alert, No new F.N deficits, Normal affect [place].AT,PERRAL Supple Neck, No JVD,   Symmetrical Chest wall movement, Good air movement bilaterally, CTAB RRR,No Gallops,Rubs or new Murmurs,  +ve B.Sounds, Abd Soft, No tenderness,   L  BKA, R AKA   Labs  Recent Labs  Lab 05/24/22 1725 05/25/22 0517 05/26/22 0513 05/27/22 0427 05/28/22 1151 05/29/22 0409 05/30/22 0047 05/30/22 0746  WBC 18.5*   < > 15.6* 18.0* 20.0* 17.6* 14.8*  --   HGB 7.1*   < > 8.1* 8.5* 8.8* 8.0* 7.7*  --   HCT 20.6*   < > 23.9* 25.6* 24.9* 24.0* 24.1*  --   PLT 129*   < > 109* 157 PLATELET CLUMPS NOTED ON SMEAR, UNABLE TO ESTIMATE 166 184 195  MCV 88.8   < > 89.5 92.1 87.7 93.4 93.1  --   MCH 30.6   < > 30.3 30.6 31.0 31.1 29.7  --   MCHC 34.5   < > 33.9 33.2 35.3 33.3 32.0  --   RDW 16.7*   < > 16.9* 17.2* 16.8* 17.3* 16.8*  --   LYMPHSABS 1.0  --   --   --  1.1 1.1 1.1  --   MONOABS 1.0  --   --   --  1.2* 1.0 0.8  --   EOSABS 0.3  --   --   --  0.2 0.2 0.2  --   BASOSABS 0.0  --   --   --  0.0 0.0 0.0  --    < > = values in this interval not displayed.    Recent Labs  Lab 05/24/22 1725 05/24/22 1952 05/25/22 0517 05/26/22 0513 05/27/22 0427 05/28/22 1151 05/29/22 0414 05/30/22 0047 05/30/22 0746  NA 131*  --  132* 132* 131* 131* 131* 128*  --   K 4.0  --  4.1 3.8 3.7 4.5 3.8 4.1  --   CL 92*  --  95* 95* 95* 94* 93* 90*  --   CO2 22  --  21* 23 23 21* 22 23  --   ANIONGAP 17*  --  16* 14 13 16* 16* 15  --   GLUCOSE 140*  --  134* 156* 150* 210* 183* 178*  --   BUN 86*  --  94* 56* 69* 45* 50* 57*  --   CREATININE 9.50*  --  9.91* 6.44* 7.92* 5.99* 7.09* 8.03*  --   AST 47*  --  33  --   --   --   --   --   --   ALT 28  --  24  --   --   --   --   --   --   ALKPHOS 71  --  66  --   --   --   --   --   --   BILITOT 1.3*  --  1.3*  --   --   --   --   --   --   ALBUMIN 2.3*  --  2.1*  --   --   --  2.1* 2.1*  --   DDIMER  --   --   --   --   --   --   --   --  2.26*  LATICACIDVEN 1.0 0.9  --   --   --   --   --   --   --   INR 1.3*  --   --   --   --   --   --  1.3* 1.2  HGBA1C  --   --  5.6  --   --   --   --   --   --   BNP 427.0*  --   --   --   --   --   --   --   --   MG 2.1  --   --   --   --   --   --   --   --    CALCIUM 9.1  --  8.9 8.6* 9.0 8.9 9.3 9.5  --     Radiology Reports ECHOCARDIOGRAM COMPLETE  Result Date: 05/27/2022    ECHOCARDIOGRAM REPORT   Patient Name:   Azahel Rish. Date of Exam: 05/27/2022 Medical Rec #:  [id number]         Height:       72.0 in Accession #:    [id number]        Weight:       249.1 lb Date of Birth:  [birth year]         BSA:          2.339 m Patient Age:    89 years  BP:           140/62 mmHg Patient Gender: M                 HR:           83 bpm. Exam Location:  [facility] Procedure: 2D Echo, Cardiac Doppler and Color Doppler Indications:    Bacteremia R78.81  History:        Patient has prior history of Echocardiogram examinations, most                 recent 06/19/2016. CHF and Cardiomyopathy, TIA; Risk                 Factors:Dyslipidemia, Diabetes and Hypertension. ESRD (end stage                 renal disease) on dialysis, COVID-19 virus infection, AICD                 (automatic cardioverter/defibrillator) present (From Hx).  Sonographer:    Alvino Chapel RCS Referring Phys: [id number] [place] IMPRESSIONS  1. Left ventricular ejection fraction, by estimation, is 45 to 50%. The left ventricle has mildly decreased function. The left ventricle demonstrates regional wall motion abnormalities (see scoring diagram/findings for description). There is moderate concentric left ventricular hypertrophy. Left ventricular diastolic parameters are consistent with Grade I diastolic dysfunction (impaired relaxation).  2. Right ventricular systolic function is normal. The right ventricular size is normal. There is normal pulmonary artery systolic pressure.  3. The mitral valve is degenerative. Trivial mitral valve regurgitation.  4. The aortic valve is tricuspid. There is mild calcification of the aortic valve. There is mild thickening of the aortic valve. Aortic valve regurgitation is not visualized. Mild aortic valve stenosis. Aortic valve mean gradient measures 11.0 mmHg.  5.  Aortic dilatation noted. There is mild dilatation of the aortic root, measuring 41 mm.  6. The inferior vena cava is normal in size with greater than 50% respiratory variability, suggesting right atrial pressure of 3 mmHg.  7. Device lead present in RA/RV with thickening and echodensity (approximately 1cm x 2cm) noted on atrial side of tricuspid valve concerning for vegetation in the setting of known bacteremia. TEE indicated for further evaluation as lead extraction may need to be considered. Comparison(s): Prior images unable to be directly viewed. FINDINGS  Left Ventricle: Left ventricular ejection fraction, by estimation, is 45 to 50%. The left ventricle has mildly decreased function. The left ventricle demonstrates regional wall motion abnormalities. Definity contrast agent was given IV to delineate the left ventricular endocardial borders. The left ventricular internal cavity size was normal in size. There is moderate concentric left ventricular hypertrophy. Left ventricular diastolic parameters are consistent with Grade I diastolic dysfunction (impaired relaxation).  LV Wall Scoring: The basal inferolateral segment, basal inferior segment, and basal inferoseptal segment are hypokinetic. The entire anterior wall, antero-lateral wall, mid and distal lateral wall, entire anterior septum, entire apex, mid and distal inferior wall, and mid inferoseptal segment are normal. Right Ventricle: The right ventricular size is normal. No increase in right ventricular wall thickness. Right ventricular systolic function is normal. There is normal pulmonary artery systolic pressure. The tricuspid regurgitant velocity is 2.20 m/s, and  with an assumed right atrial pressure of 3 mmHg, the estimated right ventricular systolic pressure is [id number] mmHg. Left Atrium: Left atrial size was normal in size. Right Atrium: Right atrial size was normal in size. Pericardium: There is no evidence of pericardial effusion. Mitral  Valve: The  mitral valve is degenerative in appearance. There is mild thickening of the mitral valve leaflet(s). There is mild calcification of the mitral valve leaflet(s). Trivial mitral valve regurgitation. Tricuspid Valve: The tricuspid valve is grossly normal. Tricuspid valve regurgitation is mild. Aortic Valve: The aortic valve is tricuspid. There is mild calcification of the aortic valve. There is mild thickening of the aortic valve. There is mild to moderate aortic valve annular calcification. Aortic valve regurgitation is not visualized. Mild aortic stenosis is present. Aortic valve mean gradient measures 11.0 mmHg. Aortic valve peak gradient measures 22.2 mmHg. Aortic valve area, by VTI measures 1.67 cm. Pulmonic Valve: The pulmonic valve was grossly normal. Pulmonic valve regurgitation is trivial. Aorta: Aortic dilatation noted. There is mild dilatation of the aortic root, measuring 41 mm. Venous: The inferior vena cava is normal in size with greater than 50% respiratory variability, suggesting right atrial pressure of 3 mmHg. IAS/Shunts: No atrial level shunt detected by color flow Doppler.  LEFT VENTRICLE PLAX 2D LVIDd:         5.20 cm   Diastology LVIDs:         3.30 cm   LV e' medial:    3.26 cm/s LV PW:         1.40 cm   LV E/e' medial:  27.2 LV IVS:        1.30 cm   LV e' lateral:   9.68 cm/s LVOT diam:     2.20 cm   LV E/e' lateral: 9.2 LV SV:         76 LV SV Index:   33 LVOT Area:     3.80 cm  RIGHT VENTRICLE RV S prime:     15.10 cm/s TAPSE (M-mode): 2.0 cm LEFT ATRIUM             Index        RIGHT ATRIUM           Index LA diam:        4.00 cm 1.71 cm/m   RA Area:     20.60 cm LA Vol (A2C):   64.4 ml 27.53 ml/m  RA Volume:   67.70 ml  28.94 ml/m LA Vol (A4C):   67.5 ml 28.86 ml/m LA Biplane Vol: 71.7 ml 30.65 ml/m  AORTIC VALVE AV Area (Vmax):    1.57 cm AV Area (Vmean):   1.60 cm AV Area (VTI):     1.67 cm AV Vmax:           235.33 cm/s AV Vmean:          154.667 cm/s AV VTI:            0.454 m  AV Peak Grad:      22.2 mmHg AV Mean Grad:      11.0 mmHg LVOT Vmax:         97.50 cm/s LVOT Vmean:        65.000 cm/s LVOT VTI:          0.200 m LVOT/AV VTI ratio: 0.44  AORTA Ao Root diam: 4.10 cm MITRAL VALVE                TRICUSPID VALVE MV Area (PHT): 4.80 cm     TR Peak grad:   19.4 mmHg MV Decel Time: 158 msec     TR Vmax:        220.00 cm/s MV E velocity: 88.60 cm/s MV A velocity: 115.50 cm/s  SHUNTS  MV E/A ratio:  0.77         Systemic VTI:  0.20 m                             Systemic Diam: 2.20 cm Rozann Lesches MD Electronically signed by Rozann Lesches MD Signature Date/Time: 05/27/2022/5:46:15 PM    Final       Signature  -   Lala Lund M.D on 05/30/2022 at 8:55 AM   -  To page go to [email or web address]

## 2022-05-30 NOTE — Consult Note (Addendum)
Cardiology Consultation   Patient ID: Godfrey Pick. MRN: [id number]; DOB: [birth year]  Admit date: 05/24/2022 Date of Consult: 05/30/2022  PCP:  [name], MD   [facility] Providers Cardiologist:  None   Lovena Le   EP Cardiologist - Cristopher Peru   Patient Profile:   Sandra Lafevers. is a 67 y.o. male with a hx of non-ischemic CM, s/p ICD insertion in 2005 who is being seen 05/30/2022 for the evaluation of Staph bacteremia at the request of Dr. Johnnye Sima.  History of Present Illness:   Mr. Tousant is a pleasant 67 yo man. He had a non-ischemic CM, and underwent ICD insertion in 2005. He had a gen change out in 2012. He has developed S. Lugdenesis bacteremia and his HD line has been removed and a TEE is pending. He feels better on IV anti-biotics. He has felt poorly for over a week prior to admission. He is s/p multiple amputations. He has not had any ICD therapies recently.    Past Medical History:  Diagnosis Date   AICD (automatic cardioverter/defibrillator) present    Anemia    Arthritis    Cerebrovascular disease    CHF (congestive heart failure) (HCC)    Chronic kidney disease    ESRD, MWF HD   COVID 2022   moderate   Diabetes mellitus    type II   History of TIAs    Hyperlipidemia    Hypertension    Nonischemic cardiomyopathy (HCC)    PVD (peripheral vascular disease) (HCC)    s/p B BKA   Shortness of breath dyspnea    with exertion    Skin disease    Rare   Stroke ([place])    "light stroke"   Tobacco abuse     Past Surgical History:  Procedure Laterality Date   A/V FISTULAGRAM Left 06/24/2016   Procedure: A/V Fistulagram;  Surgeon: Elam Dutch, MD;  Location: [facility];  Service: Cardiovascular;  Laterality: Left;   A/V FISTULAGRAM N/A 01/17/2017   Procedure: A/V Fistulagram - left arm;  Surgeon: Serafina Mitchell, MD;  Location: [facility];  Service: Cardiovascular;  Laterality: N/A;   AORTIC ARCH ANGIOGRAPHY N/A 10/21/2021    Procedure: AORTIC ARCH ANGIOGRAPHY;  Surgeon: Marty Heck, MD;  Location: [facility];  Service: Cardiovascular;  Laterality: N/A;   AV FISTULA PLACEMENT Left 04/08/2015   Procedure: Creation of Left arm BRACHIOCEPHALIC ARTERIOVENOUS  FISTULA ;  Surgeon: Mal Misty, MD;  Location: [facility];  Service: Vascular;  Laterality: Left;   AV FISTULA PLACEMENT Left 10/27/2020   Procedure: LEFT ARM ARTERIOVENOUS (AV) FISTULA CREATION;  Surgeon: Rosetta Posner, MD;  Location: [facility];  Service: Vascular;  Laterality: Left;   AV FISTULA PLACEMENT Left 06/29/2021   Procedure: INSERTION OF LEFT ARM ARTERIOVENOUS (AV) GORE-TEX GRAFT;  Surgeon: Rosetta Posner, MD;  Location: [facility];  Service: Vascular;  Laterality: Left;   [place] Left 01/12/2021   Procedure: LEFT ARM SECOND STAGE BASILIC VEIN TRANSPOSITION;  Surgeon: Rosetta Posner, MD;  Location: [facility];  Service: Vascular;  Laterality: Left;   CARDIAC DEFIBRILLATOR [place] EXTRACTION W/PHACO Right 03/17/2015   Procedure: CATARACT EXTRACTION PHACO AND INTRAOCULAR LENS PLACEMENT ([place]);  Surgeon: Rutherford Guys, MD;  Location: [facility];  Service: Ophthalmology;  Laterality: Right;  CDE: 6.59   EXCHANGE OF A DIALYSIS CATHETER Right 02/08/2022  Procedure: EXCHANGE OF A TUNNELED DIALYSIS CATHETER;  Surgeon: Rosetta Posner, MD;  [facility];  Service: Vascular;  Laterality: Right;   EYE SURGERY Right    Cataract   IR FLUORO GUIDE CV LINE RIGHT  05/26/2021   IR PTA VENOUS EXCEPT DIALYSIS CIRCUIT  05/26/2021   LEG AMPUTATION BELOW KNEE     bilateral   LIGATION ARTERIOVENOUS GORTEX GRAFT Left 09/14/2021   Procedure: LIGATION OF LEFT ARM ARTERIOVENOUS GORTEX GRAFT;  Surgeon: Broadus John, MD;  Location: [facility];  Service: Vascular;  Laterality: Left;  PERIPHERAL NERVE BLOCK   PERIPHERAL VASCULAR INTERVENTION  01/17/2017   Procedure: PERIPHERAL VASCULAR INTERVENTION;  Surgeon: Serafina Mitchell, MD;  Location: [facility];  Service: Cardiovascular;;  PTA  left arm fistula   UPPER EXTREMITY ANGIOGRAPHY N/A 10/21/2021   Procedure: Upper Extremity Angiography;  Surgeon: Marty Heck, MD;  Location: [facility];  Service: Cardiovascular;  Laterality: N/A;   WOUND DEBRIDEMENT Left 02/18/2021   Procedure: LEFT ARM DEBRIDEMENT;  Surgeon: Serafina Mitchell, MD;  [facility];  Service: Vascular;  Laterality: Left;     Home Medications:  Prior to Admission medications   Medication Sig Start Date End Date Taking? Authorizing Provider  amLODipine (NORVASC) 10 MG tablet Take 10 mg by mouth at bedtime. 05/09/17  Yes [provider]  aspirin 81 MG tablet Take 81 mg by mouth at bedtime.   Yes [provider]  brimonidine (ALPHAGAN) 0.2 % ophthalmic solution Place 1 drop into the right eye 3 (three) times daily.   Yes [provider]  calcium acetate (PHOSLO) 667 MG capsule Take 667-1,334 mg by mouth See admin instructions. Take 1,334 mg by mouth three times a day with meals and 667 mg with each snack 03/25/15  Yes [provider]  carvedilol (COREG) 12.5 MG tablet take 1 tablet by mouth twice daily.  DO NOT TAKE IN THE MORNING ON DIALYSIS DAYS Patient taking differently: Take 12.5 mg by mouth See admin instructions. Take 12.5 mg by mouth two times a day and DO NOT TAKE IN THE MORNING ON DIALYSIS DAYS 09/29/20  Yes Evans Lance, MD  cloNIDine (CATAPRES) 0.1 MG tablet Take 1 tablet (0.1 mg total) by mouth at bedtime. Patient taking differently: Take 0.1 mg by mouth 2 (two) times daily. 06/28/16  Yes Domenic Polite, MD  clopidogrel (PLAVIX) 75 MG tablet Take 75 mg by mouth at bedtime.   Yes [provider]  fluticasone (FLONASE) 50 MCG/ACT nasal spray Place 1 spray into both nostrils at bedtime as needed for allergies or rhinitis.   Yes [provider]  folic acid (FOLVITE) 1 MG tablet Take 1 mg by mouth at bedtime.    Yes [provider]  furosemide (LASIX) 20 MG tablet Take 60 mg by mouth 2 (two) times daily.   Yes [provider]  gabapentin (NEURONTIN) 300 MG capsule TAKE 1 CAPSULE(300 MG) BY MOUTH DAILY Patient taking differently: Take 300 mg by mouth at bedtime. 01/07/22  Yes [name], MD  hydrALAZINE (APRESOLINE) 25 MG tablet Take 1 tablet (25 mg total) by mouth in the morning and at bedtime. Patient taking differently: Take 25 mg by mouth See admin instructions. Taking twice daily except on Dialysis on Mon, Wed, Friday only take once daily after dialysis 05/27/21  Yes [name], DO  HYDROcodone-acetaminophen (NORCO/VICODIN) 5-325 MG tablet Take one tab po q 4 hrs prn pain 05/16/22  Yes  Triplett, Tammy, PA-C  icosapent Ethyl (VASCEPA) 1 g capsule Take 2 g by mouth 2 (two) times daily. 04/29/22  Yes [provider]  insulin lispro (HUMALOG) 100 UNIT/ML KiwkPen Inject 15-20 Units into the skin 3 (three) times daily before meals. 15 units on Dialysis days Mon, Wed, Friday   Yes [provider]  LANTUS SOLOSTAR 100 UNIT/ML Solostar Pen Inject 20 Units into the skin at bedtime. Patient taking differently: Inject 50 Units into the skin at bedtime. 05/27/21  Yes Vann, Jessica U, DO  rosuvastatin (CRESTOR) 20 MG tablet Take 20 mg by mouth at bedtime. 02/24/15  Yes [provider]  triamcinolone cream (KENALOG) 0.1 % Apply 1 Application topically 2 (two) times daily.   Yes [provider]  methocarbamol (ROBAXIN) 500 MG tablet Take 1 tablet (500 mg total) by mouth 3 (three) times daily. Patient not taking: Reported on 05/24/2022 05/16/22   Kem Parkinson, PA-C    Inpatient Medications: Scheduled Meds:  sodium chloride   Intravenous Once   amLODipine  10 mg Oral QHS   aspirin  81 mg Oral QHS   brimonidine  1 drop Right Eye TID   calcium acetate  1,334 mg Oral TID WC   carvedilol  12.5 mg Oral See admin instructions   Chlorhexidine  Gluconate Cloth  6 each Topical Daily   Chlorhexidine Gluconate Cloth  6 each Topical Q0600   cloNIDine  0.1 mg Oral QHS   clopidogrel  75 mg Oral QHS   darbepoetin (ARANESP) injection - NON-DIALYSIS  150 mcg Subcutaneous Q [id number]   folic acid  1 mg Oral QHS   gabapentin  300 mg Oral QHS   heparin  5,000 Units Subcutaneous Q8H   hydrALAZINE  25 mg Oral See admin instructions   insulin aspart  0-6 Units Subcutaneous TID WC   [START ON 05/31/2022] insulin aspart  3 Units Subcutaneous TID WC   insulin glargine-yfgn  10 Units Subcutaneous QHS   pantoprazole  40 mg Oral BID   rosuvastatin  20 mg Oral QHS   sodium chloride flush  3 mL Intravenous Q12H   Continuous Infusions:  sodium chloride     albumin human 25 g (05/27/22 0930)    ceFAZolin (ANCEF) IV 2 g (05/27/22 1243)   dextrose 50 mL/hr at 05/30/22 0620   PRN Meds: acetaminophen **OR** acetaminophen, albumin human, heparin, heparin, ondansetron **OR** ondansetron (ZOFRAN) IV, oxyCODONE  Allergies:    Allergies  Allergen Reactions   Contrast Media [Iodinated Contrast Media] Hives and Other (See Comments)    Happened "in the 80's"   Other Other (See Comments)    "Transfer Dye" = "Makes me tired"   Acetazolamide Anxiety and Other (See Comments)    Jittery, odd feeling (hyper feeling)    Social History:   Social History   Socioeconomic History   Marital status: Legally Separated    Spouse name: Not on file   Number of children: Not on file   Years of education: Not on file   Highest education level: Not on file  Occupational History   Occupation: Disabled    Employer: [employer]  Tobacco Use   Smoking status: Former    Packs/day: 1.00    Years: 20.00    Total pack years: 20.00    Types: Cigarettes    Quit date: 03/12/1998    Years since quitting: 24.2    Passive exposure: Never   Smokeless tobacco: Never  Vaping Use   Vaping Use: Never used  Substance and Sexual Activity   Alcohol use: Not Currently     Comment: occasional   Drug use: No   Sexual activity: Not on file  Other Topics Concern   Not on file  Social History Narrative   Divorced   No regular exercise   Occasional caffeine use   Social Determinants of Health   Financial Resource Strain: Not on file  Food Insecurity: Food Insecurity Present (05/25/2022)   Hunger Vital Sign    Worried About Running Out of Food in the Last Year: Never true    Ran Out of Food in the Last Year: Sometimes true  Transportation Needs: No Transportation Needs (05/25/2022)   PRAPARE - Hydrologist (Medical): No    Lack of Transportation (Non-Medical): No  Physical Activity: Not on file  Stress: Not on file  Social Connections: Not on file  Intimate Partner Violence: Not At Risk (05/25/2022)   Humiliation, Afraid, Rape, and Kick questionnaire    Fear of Current or Ex-Partner: No    Emotionally Abused: No    Physically Abused: No    Sexually Abused: No    Family History:    Family History  Problem Relation Age of Onset   Diabetes Father    Stroke Father    Diabetes Brother    Diabetes Brother    Diabetes Brother    Diabetes Brother    Heart failure Mother    Diabetes Mother    Diabetes Other    Coronary artery disease Other      ROS:  Please see the history of present illness.   All other ROS reviewed and negative.     Physical Exam/Data:   Vitals:   05/29/22 0847 05/29/22 1736 05/29/22 2059 05/30/22 0500  BP: (!) 136/115 (!) 151/97 (!) 175/68 (!) 149/69  Pulse: 91 84 84 81  Resp: '18 17 18 16  '$ Temp: 98.9 F (37.2 C) 98.3 F (36.8 C) 98.9 F (37.2 C) 98.9 F (37.2 C)  TempSrc: Oral Oral Oral Oral  SpO2: 94% 97% 100% 100%  Weight:      Height:        Intake/Output Summary (Last 24 hours) at 05/30/2022 0730 Last data filed at 05/30/2022 0500 Gross per 24 hour  Intake 640 ml  Output 0 ml  Net 640 ml       05/27/2022    1:23 PM 05/27/2022    9:10 AM 05/26/2022   12:22 AM  Last 3 Weights   Weight (lbs) 249 lb 1.9 oz 251 lb 8.7 oz 251 lb 15.8 oz  Weight (kg) 113 kg 114.1 kg 114.3 kg     Body mass index is 33.79 kg/m.  General: chronically ill appearing, dozing off during exam, no acute distress HEENT: normal Neck: no JVD Vascular: No carotid bruits; Distal pulses 2+ bilaterally Cardiac:  normal S1, S2; RRR; no murmur  Lungs:  clear to auscultation bilaterally, no wheezing, rhonchi or rales  Abd: soft, nontender, no hepatomegaly  Ext: s/p bilateral amputations. Musculoskeletal:  No deformities, BUE and BLE strength normal and equal Skin: warm and dry Neuro:  no focal abnormalities noted Psych:  Normal affect   EKG:  no new EKG  Telemetry:  Telemetry was personally reviewed and demonstrates:  NSR, rates 60-80s  Relevant CV Studies: See above  Laboratory Data:  High Sensitivity Troponin:  No results for input(s): "TROPONINIHS" in the last 720 hours.   Chemistry Recent Labs  Lab 05/24/22 1725 05/25/22 [id number]  05/28/22 1151 05/29/22 0414 05/30/22 0047  NA 131*   < > 131* 131* 128*  K 4.0   < > 4.5 3.8 4.1  CL 92*   < > 94* 93* 90*  CO2 22   < > 21* 22 23  GLUCOSE 140*   < > 210* 183* 178*  BUN 86*   < > 45* 50* 57*  CREATININE 9.50*   < > 5.99* 7.09* 8.03*  CALCIUM 9.1   < > 8.9 9.3 9.5  MG 2.1  --   --   --   --   GFRNONAA 5*   < > 10* 8* 7*  ANIONGAP 17*   < > 16* 16* 15   < > = values in this interval not displayed.     Recent Labs  Lab 05/24/22 1725 05/25/22 0517 05/29/22 0414 05/30/22 0047  PROT 5.7* 5.5*  --   --   ALBUMIN 2.3* 2.1* 2.1* 2.1*  AST 47* 33  --   --   ALT 28 24  --   --   ALKPHOS 71 66  --   --   BILITOT 1.3* 1.3*  --   --     Lipids No results for input(s): "CHOL", "TRIG", "HDL", "LABVLDL", "LDLCALC", "CHOLHDL" in the last 168 hours.  Hematology Recent Labs  Lab 05/28/22 1151 05/29/22 0409 05/30/22 0047  WBC 20.0* 17.6* 14.8*  RBC 2.84* 2.57* 2.59*  HGB 8.8* 8.0* 7.7*  HCT 24.9* 24.0* 24.1*  MCV 87.7 93.4 93.1   MCH 31.0 31.1 29.7  MCHC 35.3 33.3 32.0  RDW 16.8* 17.3* 16.8*  PLT PLATELET CLUMPS NOTED ON SMEAR, UNABLE TO ESTIMATE 166 184    Thyroid No results for input(s): "TSH", "FREET4" in the last 168 hours.  BNP Recent Labs  Lab 05/24/22 1725  BNP 427.0*     DDimer No results for input(s): "DDIMER" in the last 168 hours.   Radiology/Studies:  ECHOCARDIOGRAM COMPLETE  Result Date: 05/27/2022 1. Left ventricular ejection fraction, by estimation, is 45 to 50%. The left ventricle has mildly decreased function. The left ventricle demonstrates regional wall motion abnormalities (see scoring diagram/findings for description). There is moderate concentric left ventricular hypertrophy. Left ventricular diastolic parameters are consistent with Grade I diastolic dysfunction (impaired relaxation).   2. Right ventricular systolic function is normal. The right ventricular size is normal. There is normal pulmonary artery systolic pressure.   3. The mitral valve is degenerative. Trivial mitral valve regurgitation.   4. The aortic valve is tricuspid. There is mild calcification of the aortic valve. There is mild thickening of the aortic valve. Aortic valve regurgitation is not visualized. Mild aortic valve stenosis. Aortic valve mean gradient measures 11.0 mmHg.   5. Aortic dilatation noted. There is mild dilatation of the aortic root, measuring 41 mm.   6. The inferior vena cava is normal in size with greater than 50% respiratory variability, suggesting right atrial pressure of 3 mmHg.   7. Device lead present in RA/RV with thickening and echodensity (approximately 1cm x 2cm) noted on atrial side of tricuspid valve concerning for vegetation in the setting of known bacteremia. TEE indicated for further evaluation as lead extraction may need to be considered. Comparison(s): Prior images unable to be directly viewed.   Assessment and Plan:   Staph Lugdenesis bacteremia - TEE planned for tomorrow, 2/27.  Continue IV abx. Anticipate that ICD lead is involved. Extraction is high risk. I discussed the risk and benefits of TEE. Patient verbalized understanding  and wished to proceed.  Chronic systolic heart failure - he is euvolemic. Continue HD.    Risk Assessment/Risk Scores:      For questions or updates, please contact [place] Please consult [email or web address] for contact info under    Signed, Mamie Levers, NP  05/30/2022 7:30 AM   EP Attending  Patient seen and examined. Agree with above. The patient's symptoms are stable. I discussed the treatment options including difficult extraction of an 67 yo dual coil ICD system or palliative therapy with a course of anti-biotics. He prefers to take anti-biotics. He has many comorbidities and I think that this is a reasonable decision.  Carleene Overlie Zackari Ruane,MD

## 2022-05-30 NOTE — Progress Notes (Signed)
Pre Hemodialysis  05/30/22 2143  Vitals  Temp 98.1 F (36.7 C)  Pulse Rate 74  Resp 18  BP (!) 160/73  SpO2 100 %  O2 Device Nasal Cannula  Type of Weight Pre-Dialysis  Oxygen Therapy  O2 Flow Rate (L/min) 2 L/min  Patient Activity (if Appropriate) In bed  Pulse Oximetry Type Continuous  Pre Treatment  Is pt a NEW START this admission?  No  Vascular access used during treatment Catheter  HD catheter dressing before treatment WDL  Patient is receiving dialysis in a chair No  Hemodialysis Consent Verified Yes  Hemodialysis Standing Orders Initiated Yes  ECG (Telemetry) Monitor On Yes  Prime Ordered Normal Saline  Length of  DialysisTreatment -hour(s) 3.5 Hour(s)  Dialysis mode HD  Dialysate 3K  Dialysis Anticoagulation Automated NS Flushes  Dialysate Flow Ordered 300  Blood Flow Rate Ordered 400 mL/min  Ultrafiltration Goal 2000 Liters  Dialysis Blood Pressure Support Ordered Normal Saline

## 2022-05-30 NOTE — Progress Notes (Signed)
[facility]  Date of Admission:  05/24/2022     Principal Problem:   Bacteremia Active Problems:   Hyperlipidemia   Essential hypertension   DM (diabetes mellitus), type 2 with peripheral vascular complications (HCC)   Anemia   ESRD (end stage renal disease) on dialysis (HCC)   Kidney lesion, native, left   Gynecomastia   Class 1 obesity due to excess calories with body mass index (BMI) of 33.0 to 33.9 in adult          Assessment: 67 YM transferred from [facility] for EP eval. Admitted with staph lugdenensis bacteremia,  #Staph Lugdenensis  bacteremia #ESRD on HD via tunneled cath #ICD -EP and nephrology on board -Blodo Cx form 2/20 and 2/21 NG Recommendations: -Continue cefazolin -Right chest HD cath removed by IR on 2/23 -TEE 2/27, anticipate ID lead involved, extraction high risk. EP following -Will get Staph lugdennesis Cx from HD  Microbiology:   Antibiotics: Cefazolin 2/21- Cultures: Blood 2/20ng 2/21 ng Urine  Other   SUBJECTIVE: Sitting at the edge of bed. No new complaints Interval:  Afebrile overnight, wbc 14.8k.  Review of Systems: Review of Systems  All other systems reviewed and are negative.    Scheduled Meds:  sodium chloride   Intravenous Once   amLODipine  10 mg Oral QHS   aspirin  81 mg Oral QHS   brimonidine  1 drop Right Eye TID   calcitRIOL  2.25 mcg Oral Q M,W,F   calcium acetate  1,334 mg Oral TID WC   carvedilol  12.5 mg Oral See admin instructions   Chlorhexidine Gluconate Cloth  6 each Topical Daily   Chlorhexidine Gluconate Cloth  6 each Topical Q0600   cinacalcet  30 mg Oral Q M,W,F   cloNIDine  0.1 mg Oral QHS   clopidogrel  75 mg Oral QHS   darbepoetin (ARANESP) injection - NON-DIALYSIS  150 mcg Subcutaneous Q A999333   folic acid  1 mg Oral QHS   gabapentin  300 mg Oral QHS   heparin  5,000 Units Subcutaneous Q8H   heparin sodium (porcine)       hydrALAZINE  25 mg Oral See  admin instructions   insulin aspart  0-6 Units Subcutaneous TID WC   [START ON 05/31/2022] insulin aspart  3 Units Subcutaneous TID WC   insulin glargine-yfgn  10 Units Subcutaneous QHS   lidocaine       pantoprazole  40 mg Oral BID   rosuvastatin  20 mg Oral QHS   sodium chloride flush  3 mL Intravenous Q12H   Continuous Infusions:  sodium chloride     albumin human 25 g (05/27/22 0930)    ceFAZolin (ANCEF) IV 2 g (05/30/22 1125)   [START ON 06/01/2022]  ceFAZolin (ANCEF) IV     dextrose 50 mL/hr at 05/30/22 0620   PRN Meds:.acetaminophen **OR** acetaminophen, albumin human, heparin, heparin, heparin sodium (porcine), lidocaine, ondansetron **OR** ondansetron (ZOFRAN) IV, oxyCODONE Allergies  Allergen Reactions   Contrast Media [Iodinated Contrast Media] Hives and Other (See Comments)    Happened "in the 80's"   Other Other (See Comments)    "Transfer Dye" = "Makes me tired"   Acetazolamide Anxiety and Other (See Comments)    Jittery, odd feeling (hyper feeling)    OBJECTIVE: Vitals:   05/29/22 1736 05/29/22 2059 05/30/22 0500 05/30/22 0939  BP: (!) 151/97 (!) 175/68 (!) 149/69 (!) 138/113  Pulse: 84 84 81  88  Resp: '17 18 16 19  '$ Temp: 98.3 F (36.8 C) 98.9 F (37.2 C) 98.9 F (37.2 C) 98.3 F (36.8 C)  TempSrc: Oral Oral Oral   SpO2: 97% 100% 100% 100%  Weight:      Height:       Body mass index is 33.79 kg/m.  Physical Exam Constitutional:      General: He is not in acute distress.    Appearance: He is normal weight. He is not toxic-appearing.  HENT:     Head: Normocephalic and atraumatic.     Right Ear: External ear normal.     Left Ear: External ear normal.     Nose: No congestion or rhinorrhea.     Mouth/Throat:     Mouth: Mucous membranes are moist.     Pharynx: Oropharynx is clear.  Eyes:     Extraocular Movements: Extraocular movements intact.     Conjunctiva/sclera: Conjunctivae normal.     Pupils: Pupils are equal, round, and reactive to light.   Cardiovascular:     Rate and Rhythm: Normal rate and regular rhythm.     Heart sounds: No murmur heard.    No friction rub. No gallop.  Pulmonary:     Effort: Pulmonary effort is normal.     Breath sounds: Normal breath sounds.  Chest:     Comments: Chest wound C/D?I  No tenderness at PPM Abdominal:     General: Abdomen is flat. Bowel sounds are normal.     Palpations: Abdomen is soft.  Musculoskeletal:        General: No swelling. Normal range of motion.     Cervical back: Normal range of motion and neck supple.  Skin:    General: Skin is warm and dry.  Neurological:     General: No focal deficit present.     Mental Status: He is oriented to person, place, and time.  Psychiatric:        Mood and Affect: Mood normal.       Lab Results Lab Results  Component Value Date   WBC 14.8 (H) 05/30/2022   HGB 7.7 (L) 05/30/2022   HCT 24.1 (L) 05/30/2022   MCV 93.1 05/30/2022   PLT 195 05/30/2022    Lab Results  Component Value Date   CREATININE 8.03 (H) 05/30/2022   BUN 57 (H) 05/30/2022   NA 128 (L) 05/30/2022   K 4.1 05/30/2022   CL 90 (L) 05/30/2022   CO2 23 05/30/2022    Lab Results  Component Value Date   ALT 24 05/25/2022   AST 33 05/25/2022   ALKPHOS 66 05/25/2022   BILITOT 1.3 (H) 05/25/2022        Laurice Record, [facility] 05/30/2022, 11:35 AM

## 2022-05-30 NOTE — Procedures (Signed)
Interventional Radiology Procedure Note  Procedure: Tunneled HD catheter placement  Complications: None  Estimated Blood Loss: < 10 mL  Findings: Right IJ tunneled 19 cm tip to cuff Palindrome catheter placed with tip in RA. OK to use.  Venetia Night. Kathlene Cote, M.D Pager:  [phone]

## 2022-05-30 NOTE — Progress Notes (Addendum)
[facility] NEPHROLOGY PROGRESS NOTE  Assessment/ Plan: Pt is a 67 y.o. yo male   Dialysis Orders:  Center: [facility]  on MWF . EDW 116.5kg, Bath 2K/2.5Ca,  Time 4 hours ,Access RIJ TDC  BFR 400 DFR 500 UF profile 2    Meds: mircera 75 mcg every 2 weeks (due again on 2/26) Calcitriol 2.25 mcg three times a week Senispar 30 mg in center three times a week Heparin 2000 units IVP then 1000 units/hr.   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  On IV Ancef and repeat cultures are negative so far.  Pending TEE.  Consulted IR to place new HD catheter.  # ESRD on HD, MWF: Plan for tunneled HD catheter placement by IR today.  Followed by HD.  # Anemia: Received Aranesp on 2/26, continue with.Monitor hemoglobin.  # Secondary hyperparathyroidism: Resume calcitriol, Sensipar and continue PhosLo for hyperphosphatemia.  # HTN/volume: Blood pressure variable.  Continue current antihypertensives and UF with HD.  # Hyponatremia, hypervolemic: Continue fluid restriction and ultrafiltration during HD.  Subjective: Seen and examined at bedside.  Currently NPO.  Denies nausea, vomiting, chest pain, shortness of breath. Objective Vital signs in last 24 hours: Vitals:   05/29/22 1736 05/29/22 2059 05/30/22 0500 05/30/22 0939  BP: (!) 151/97 (!) 175/68 (!) 149/69 (!) 138/113  Pulse: 84 84 81 88  Resp: '17 18 16 19  '$ Temp: 98.3 F (36.8 C) 98.9 F (37.2 C) 98.9 F (37.2 C) 98.3 F (36.8 C)  TempSrc: Oral Oral Oral   SpO2: 97% 100% 100% 100%  Weight:      Height:       Weight change:   Intake/Output Summary (Last 24 hours) at 05/30/2022 1056 Last data filed at 05/30/2022 0800 Gross per 24 hour  Intake 500 ml  Output 0 ml  Net 500 ml       Labs: RENAL PANEL Recent Labs    09/14/21 [id number] 10/21/21 1006 02/08/22 0857 05/24/22 1725 05/25/22 0517 05/26/22 0513  05/27/22 0427 05/28/22 1151 05/29/22 0414 05/30/22 0047  NA 140 139 138 131* 132* 132* 131* 131* 131* 128*  K 5.6* 4.3 5.0 4.0 4.1 3.8 3.7 4.5 3.8 4.1  CL 105 101 102 92* 95* 95* 95* 94* 93* 90*  CO2  --   --   --  22 21* 23 23 21* 22 23  GLUCOSE 122* 76 247* 140* 134* 156* 150* 210* 183* 178*  BUN 64* 32* 40* 86* 94* 56* 69* 45* 50* 57*  CREATININE 9.10* 7.10* 8.30* 9.50* 9.91* 6.44* 7.92* 5.99* 7.09* 8.03*  CALCIUM  --   --   --  9.1 8.9 8.6* 9.0 8.9 9.3 9.5  MG  --   --   --  2.1  --   --   --   --   --   --   PHOS  --   --   --   --  6.4*  --   --   --  4.5 5.1*  ALBUMIN  --   --   --  2.3* 2.1*  --   --   --  2.1* 2.1*     Liver Function Tests: Recent Labs  Lab 05/24/22 1725 05/25/22 0517 05/29/22 0414 05/30/22 0047  AST 47* 33  --   --   ALT 28 24  --   --   ALKPHOS 71  66  --   --   BILITOT 1.3* 1.3*  --   --   PROT 5.7* 5.5*  --   --   ALBUMIN 2.3* 2.1* 2.1* 2.1*   No results for input(s): "LIPASE", "AMYLASE" in the last 168 hours. No results for input(s): "AMMONIA" in the last 168 hours. CBC: Recent Labs    05/25/22 0517 05/26/22 0513 05/27/22 0427 05/28/22 1151 05/29/22 0409 05/30/22 0047 05/30/22 0746  HGB 7.1* 8.1* 8.5* 8.8* 8.0* 7.7*  --   MCV 91.8 89.5 92.1 87.7 93.4 93.1  --   VITAMINB12 268  --   --   --   --   --  288  FOLATE 25.3  --   --   --   --   --  34.5  FERRITIN 4,259*  --   --   --   --   --  3,315*  TIBC NOT CALCULATED  --   --   --   --   --  119*  IRON 42*  --   --   --   --   --  39*  RETICCTPCT 2.5  --   --   --   --   --  3.5*    Cardiac Enzymes: No results for input(s): "CKTOTAL", "CKMB", "CKMBINDEX", "TROPONINI" in the last 168 hours. CBG: Recent Labs  Lab 05/29/22 1217 05/29/22 1657 05/29/22 2118 05/30/22 0618 05/30/22 0728  GLUCAP 200* 141* 161* 150* 152*    Iron Studies:  Recent Labs    05/30/22 0746  IRON 39*  TIBC 119*  FERRITIN 3,315*   Studies/Results: No results found.  Medications: Infusions:   sodium chloride     albumin human 25 g (05/27/22 0930)    ceFAZolin (ANCEF) IV     [START ON 06/01/2022]  ceFAZolin (ANCEF) IV     dextrose 50 mL/hr at 05/30/22 0620    Scheduled Medications:  sodium chloride   Intravenous Once   amLODipine  10 mg Oral QHS   aspirin  81 mg Oral QHS   brimonidine  1 drop Right Eye TID   calcium acetate  1,334 mg Oral TID WC   carvedilol  12.5 mg Oral See admin instructions   Chlorhexidine Gluconate Cloth  6 each Topical Daily   Chlorhexidine Gluconate Cloth  6 each Topical Q0600   cloNIDine  0.1 mg Oral QHS   clopidogrel  75 mg Oral QHS   darbepoetin (ARANESP) injection - NON-DIALYSIS  150 mcg Subcutaneous Q [id number]   folic acid  1 mg Oral QHS   gabapentin  300 mg Oral QHS   heparin  5,000 Units Subcutaneous Q8H   hydrALAZINE  25 mg Oral See admin instructions   insulin aspart  0-6 Units Subcutaneous TID WC   [START ON 05/31/2022] insulin aspart  3 Units Subcutaneous TID WC   insulin glargine-yfgn  10 Units Subcutaneous QHS   pantoprazole  40 mg Oral BID   rosuvastatin  20 mg Oral QHS   sodium chloride flush  3 mL Intravenous Q12H    have reviewed scheduled and prn medications.  Physical Exam: General:NAD, comfortable Heart:RRR, s1s2 nl Lungs:clear b/l, no crackle Abdomen:soft, Non-tender, non-distended Extremities: Bilateral BKA, trace stump edema Dialysis Access: No dialysis access at this time  Deniz Hannan Prasad Baileigh Modisette 05/30/2022,10:56 AM  LOS: 5 days

## 2022-05-31 ENCOUNTER — Encounter: Payer: Medicare Other | Admitting: Internal Medicine

## 2022-05-31 ENCOUNTER — Encounter (HOSPITAL_COMMUNITY): Payer: Self-pay | Admitting: Family Medicine

## 2022-05-31 ENCOUNTER — Other Ambulatory Visit (HOSPITAL_COMMUNITY): Payer: Medicare Other

## 2022-05-31 ENCOUNTER — Encounter (HOSPITAL_COMMUNITY)
Admission: EM | Disposition: A | Discharge status: Skilled Nursing Facility | Payer: Self-pay | Source: Home / Self Care | Attending: Internal Medicine

## 2022-05-31 ENCOUNTER — Inpatient Hospital Stay (HOSPITAL_COMMUNITY): Payer: Medicare Other | Admitting: Certified Registered"

## 2022-05-31 ENCOUNTER — Inpatient Hospital Stay (HOSPITAL_COMMUNITY): Payer: Medicare Other

## 2022-05-31 DIAGNOSIS — I509 Heart failure, unspecified: Secondary | ICD-10-CM | POA: Diagnosis not present

## 2022-05-31 DIAGNOSIS — I339 Acute and subacute endocarditis, unspecified: Secondary | ICD-10-CM | POA: Diagnosis not present

## 2022-05-31 DIAGNOSIS — I361 Nonrheumatic tricuspid (valve) insufficiency: Secondary | ICD-10-CM | POA: Diagnosis not present

## 2022-05-31 DIAGNOSIS — N186 End stage renal disease: Secondary | ICD-10-CM | POA: Diagnosis not present

## 2022-05-31 DIAGNOSIS — Z87891 Personal history of nicotine dependence: Secondary | ICD-10-CM

## 2022-05-31 DIAGNOSIS — I132 Hypertensive heart and chronic kidney disease with heart failure and with stage 5 chronic kidney disease, or end stage renal disease: Secondary | ICD-10-CM | POA: Diagnosis not present

## 2022-05-31 DIAGNOSIS — Z992 Dependence on renal dialysis: Secondary | ICD-10-CM

## 2022-05-31 DIAGNOSIS — I350 Nonrheumatic aortic (valve) stenosis: Secondary | ICD-10-CM

## 2022-05-31 DIAGNOSIS — R7881 Bacteremia: Secondary | ICD-10-CM | POA: Diagnosis not present

## 2022-05-31 DIAGNOSIS — E1122 Type 2 diabetes mellitus with diabetic chronic kidney disease: Secondary | ICD-10-CM

## 2022-05-31 DIAGNOSIS — I351 Nonrheumatic aortic (valve) insufficiency: Secondary | ICD-10-CM | POA: Diagnosis not present

## 2022-05-31 DIAGNOSIS — D631 Anemia in chronic kidney disease: Secondary | ICD-10-CM

## 2022-05-31 HISTORY — PX: TEE WITHOUT CARDIOVERSION: SHX5443

## 2022-05-31 LAB — RENAL FUNCTION PANEL
Albumin: 2.2 g/dL — ABNORMAL LOW (ref 3.5–5.0)
Anion gap: 17 — ABNORMAL HIGH (ref 5–15)
BUN: 33 mg/dL — ABNORMAL HIGH (ref 8–23)
CO2: 22 mmol/L (ref 22–32)
Calcium: 9.2 mg/dL (ref 8.9–10.3)
Chloride: 93 mmol/L — ABNORMAL LOW (ref 98–111)
Creatinine, Ser: 5.19 mg/dL — ABNORMAL HIGH (ref 0.61–1.24)
GFR, Estimated: 11 mL/min — ABNORMAL LOW (ref >60)
Glucose, Bld: 196 mg/dL — ABNORMAL HIGH (ref 70–99)
Phosphorus: 3.4 mg/dL (ref 2.5–4.6)
Potassium: 3.9 mmol/L (ref 3.5–5.1)
Sodium: 132 mmol/L — ABNORMAL LOW (ref 135–145)

## 2022-05-31 LAB — ECHO TEE
AR max vel: 1.83 cm2
AV Area VTI: 1.81 cm2
AV Area mean vel: 1.7 cm2
AV Mean grad: 10 mmHg
AV Peak grad: 19.9 mmHg
Ao pk vel: 2.23 m/s
P 1/2 time: 392 msec

## 2022-05-31 LAB — CBC WITH DIFFERENTIAL/PLATELET
Abs Immature Granulocytes: 0.12 10*3/uL — ABNORMAL HIGH (ref 0.00–0.07)
Basophils Absolute: 0 10*3/uL (ref 0.0–0.1)
Basophils Relative: 0 %
Eosinophils Absolute: 0.1 10*3/uL (ref 0.0–0.5)
Eosinophils Relative: 1 %
HCT: 26.3 % — ABNORMAL LOW (ref 39.0–52.0)
Hemoglobin: 8.7 g/dL — ABNORMAL LOW (ref 13.0–17.0)
Immature Granulocytes: 1 %
Lymphocytes Relative: 2 %
Lymphs Abs: 0.5 10*3/uL — ABNORMAL LOW (ref 0.7–4.0)
MCH: 30.6 pg (ref 26.0–34.0)
MCHC: 33.1 g/dL (ref 30.0–36.0)
MCV: 92.6 fL (ref 80.0–100.0)
Monocytes Absolute: 0.8 10*3/uL (ref 0.1–1.0)
Monocytes Relative: 4 %
Neutro Abs: 19.5 10*3/uL — ABNORMAL HIGH (ref 1.7–7.7)
Neutrophils Relative %: 92 %
Platelets: 160 10*3/uL (ref 150–400)
RBC: 2.84 MIL/uL — ABNORMAL LOW (ref 4.22–5.81)
RDW: 16.5 % — ABNORMAL HIGH (ref 11.5–15.5)
WBC: 21 10*3/uL — ABNORMAL HIGH (ref 4.0–10.5)
nRBC: 0 % (ref 0.0–0.2)

## 2022-05-31 LAB — GLUCOSE, CAPILLARY
Glucose-Capillary: 173 mg/dL — ABNORMAL HIGH (ref 70–99)
Glucose-Capillary: 173 mg/dL — ABNORMAL HIGH (ref 70–99)
Glucose-Capillary: 156 mg/dL — ABNORMAL HIGH (ref 70–99)
Glucose-Capillary: 132 mg/dL — ABNORMAL HIGH (ref 70–99)
Glucose-Capillary: 125 mg/dL — ABNORMAL HIGH (ref 70–99)
Glucose-Capillary: 131 mg/dL — ABNORMAL HIGH (ref 70–99)

## 2022-05-31 LAB — HAPTOGLOBIN: Haptoglobin: 229 mg/dL (ref 32–363)

## 2022-05-31 SURGERY — ECHOCARDIOGRAM, TRANSESOPHAGEAL
Anesthesia: Monitor Anesthesia Care

## 2022-05-31 MED ORDER — CARVEDILOL 12.5 MG PO TABS
12.5000 mg | ORAL_TABLET | ORAL | Status: DC
  Filled 2022-05-31: qty 1

## 2022-05-31 MED ORDER — HYDRALAZINE HCL 25 MG PO TABS
25.0000 mg | ORAL_TABLET | ORAL | Status: DC
  Administered 2022-06-01: 25 mg via ORAL
  Filled 2022-05-31: qty 1

## 2022-05-31 MED ORDER — SODIUM CHLORIDE 0.9 % IV SOLN: INTRAVENOUS | Status: DC

## 2022-05-31 MED ORDER — HYDRALAZINE HCL 20 MG/ML IJ SOLN: 10.0000 mg | Freq: Four times a day (QID) | INTRAMUSCULAR | Status: DC | PRN

## 2022-05-31 MED ORDER — PHENYLEPHRINE HCL (PRESSORS) 10 MG/ML IV SOLN
INTRAVENOUS | Status: DC | PRN
  Administered 2022-05-31 (×2): 80 ug via INTRAVENOUS
  Administered 2022-05-31: 160 ug via INTRAVENOUS
  Administered 2022-05-31 (×2): 80 ug via INTRAVENOUS

## 2022-05-31 MED ORDER — CYANOCOBALAMIN 1000 MCG/ML IJ SOLN
1000.0000 ug | Freq: Every day | INTRAMUSCULAR | Status: AC
  Administered 2022-05-31 – 2022-06-02 (×3): 1000 ug via INTRAMUSCULAR
  Filled 2022-05-31 (×3): qty 1

## 2022-05-31 MED ORDER — HYDRALAZINE HCL 25 MG PO TABS
25.0000 mg | ORAL_TABLET | ORAL | Status: DC
  Administered 2022-05-31 – 2022-06-04 (×3): 25 mg via ORAL
  Filled 2022-05-31 (×4): qty 1

## 2022-05-31 MED ORDER — CARVEDILOL 6.25 MG PO TABS: 6.2500 mg | ORAL_TABLET | ORAL | Status: DC

## 2022-05-31 MED ORDER — EPHEDRINE SULFATE (PRESSORS) 50 MG/ML IJ SOLN
INTRAMUSCULAR | Status: DC | PRN
  Administered 2022-05-31: 5 mg via INTRAVENOUS

## 2022-05-31 MED ORDER — CHLORHEXIDINE GLUCONATE CLOTH 2 % EX PADS
6.0000 | MEDICATED_PAD | Freq: Every day | CUTANEOUS | Status: DC
  Administered 2022-06-01 – 2022-06-03 (×3): 6 via TOPICAL

## 2022-05-31 MED ORDER — PROPOFOL 500 MG/50ML IV EMUL
INTRAVENOUS | Status: DC | PRN
  Administered 2022-05-31: 100 ug/kg/min via INTRAVENOUS

## 2022-05-31 MED ORDER — VITAMIN B-12 1000 MCG PO TABS
1000.0000 ug | ORAL_TABLET | Freq: Every day | ORAL | Status: DC
  Administered 2022-06-03 – 2022-06-07 (×5): 1000 ug via ORAL
  Filled 2022-05-31 (×6): qty 1

## 2022-05-31 MED ORDER — CARVEDILOL 12.5 MG PO TABS
12.5000 mg | ORAL_TABLET | ORAL | Status: DC
  Administered 2022-06-01: 12.5 mg via ORAL
  Filled 2022-05-31: qty 1

## 2022-05-31 MED ORDER — CARVEDILOL 12.5 MG PO TABS
12.5000 mg | ORAL_TABLET | ORAL | Status: DC
  Administered 2022-05-31 – 2022-06-04 (×4): 12.5 mg via ORAL
  Filled 2022-05-31 (×6): qty 1

## 2022-05-31 NOTE — Anesthesia Preprocedure Evaluation (Signed)
Anesthesia Evaluation  Patient identified by MRN, date of birth, ID band Patient awake    Reviewed: Allergy & Precautions, NPO status , Patient's Chart, lab work & pertinent test results, reviewed documented beta blocker date and time   History of Anesthesia Complications Negative for: history of anesthetic complications  Airway Mallampati: III   Neck ROM: Full    Dental  (+) Dental Advisory Given   Pulmonary shortness of breath, former smoker   Pulmonary exam normal        Cardiovascular hypertension, Pt. on medications and Pt. on home beta blockers + Peripheral Vascular Disease and +CHF  + Cardiac Defibrillator (device has never shocked patient)  Rhythm:Regular Rate:Normal  '18 ECHO: EF 35-40%. There is akinesis of the basal-midanteroseptal and    inferoseptal myocardium, Grade 2 DD, no significant valvular abnormalities    Neuro/Psych TIACVA    GI/Hepatic negative GI ROS, Neg liver ROS,,,  Endo/Other  diabetes, Type 2, Insulin Dependent  BMI 35  Renal/GU ESRF and DialysisRenal disease (MWF)     Musculoskeletal  (+) Arthritis ,    Abdominal  (+) + obese  Peds  Hematology  (+) Blood dyscrasia, anemia   Anesthesia Other Findings   Reproductive/Obstetrics                             Anesthesia Physical Anesthesia Plan  ASA: 3  Anesthesia Plan: MAC   Post-op Pain Management: Tylenol PO (pre-op)*   Induction:   PONV Risk Score and Plan: 2 and Propofol infusion and TIVA  Airway Management Planned: Natural Airway and Mask  Additional Equipment: TEE  Intra-op Plan:   Post-operative Plan:   Informed Consent: I have reviewed the patients History and Physical, chart, labs and discussed the procedure including the risks, benefits and alternatives for the proposed anesthesia with the patient or authorized representative who has indicated his/her understanding and acceptance.      Dental advisory given  Plan Discussed with: CRNA  Anesthesia Plan Comments:         Anesthesia Quick Evaluation

## 2022-05-31 NOTE — Progress Notes (Addendum)
Patient Name: [name]. Date of Encounter: 05/31/2022  Primary Cardiologist: None Electrophysiologist: Dr. Lovena Le  Interval Summary   Pt continues to decline extraction consideration. He is initially confused on exam.   Some tachycardia noted overnight. Afebrile.   Currently only complaint is mouth soreness.   Inpatient Medications    Scheduled Meds:  amLODipine  10 mg Oral QHS   aspirin  81 mg Oral QHS   brimonidine  1 drop Right Eye TID   calcitRIOL  2.25 mcg Oral Q M,W,F   calcium acetate  1,334 mg Oral TID WC   [START ON 06/01/2022] carvedilol  12.5 mg Oral Q M,W,F-2000   carvedilol  12.5 mg Oral 2 times per day on Sun Tue Thu Sat   Chlorhexidine Gluconate Cloth  6 each Topical Daily   Chlorhexidine Gluconate Cloth  6 each Topical Q0600   cinacalcet  30 mg Oral Q M,W,F   cloNIDine  0.1 mg Oral QHS   clopidogrel  75 mg Oral QHS   cyanocobalamin  1,000 mcg Intramuscular Daily   [START ON 06/03/2022] vitamin B-12  1,000 mcg Oral Daily   darbepoetin (ARANESP) injection - NON-DIALYSIS  150 mcg Subcutaneous Q [id number]   folic acid  1 mg Oral QHS   gabapentin  300 mg Oral QHS   heparin  5,000 Units Subcutaneous Q8H   hydrALAZINE  25 mg Oral 2 times per day on Sun Tue Thu Sat   [START ON 06/01/2022] hydrALAZINE  25 mg Oral Q M,W,F-2000   insulin aspart  0-6 Units Subcutaneous TID WC   insulin aspart  3 Units Subcutaneous TID WC   insulin glargine-yfgn  10 Units Subcutaneous QHS   pantoprazole  40 mg Oral BID   rosuvastatin  20 mg Oral QHS   sodium chloride flush  3 mL Intravenous Q12H   Continuous Infusions:  sodium chloride Stopped (05/30/22 1310)   albumin human 25 g (05/27/22 0930)   [START ON 06/01/2022]  ceFAZolin (ANCEF) IV     PRN Meds: acetaminophen **OR** acetaminophen, albumin human, hydrALAZINE, ondansetron **OR** ondansetron (ZOFRAN) IV, oxyCODONE   Vital Signs    Vitals:   05/31/22 0113 05/31/22 0143 05/31/22 0356 05/31/22 0600  BP: (!) 162/109  139/75 116/84 (!) 101/48  Pulse: 91 99 (!) 118 92  Resp: '13 15 18   '$ Temp:  98.1 F (36.7 C) 99.9 F (37.7 C) 98.9 F (37.2 C)  TempSrc:  Oral Oral Oral  SpO2: 100% 96% 100% 100%  Weight:      Height:        Intake/Output Summary (Last 24 hours) at 05/31/2022 0818 Last data filed at 05/31/2022 0143 Gross per 24 hour  Intake 200 ml  Output 1400 ml  Net -1200 ml   Filed Weights   05/26/22 0022 05/27/22 0910 05/27/22 1323  Weight: 114.3 kg 114.1 kg 113 kg    Physical Exam    GEN- The patient is chronically ill appearing appearing, alert and oriented to person and place, intermittently to situation Lungs- Normal effort Cardiac- Regular rate and rhythm, no murmurs, rubs or gallops GI- soft, NT, ND, + BS Extremities- Bilateral BKA  Telemetry    NSR/Sinus tach 90-110s (personally reviewed)   Assessment & Plan    Staph Lugdenesis bacteremia TEE pending. Have discussed with attending team and ID. Not clinically indicated from a cardiology standpoint as he is refusing extraction and would not likely be a candidate for other cardiac surgery.  We have recommended palliative use of  antibiotics.    Chronic systolic heart failure  Volume per HD.  Echo 05/27/2022 LVEF 45-50, grade 1 DD  EP will see as needed while remains here. Please call back with questions.   For questions or updates, please contact [place] Please consult [email or web address] for contact info under Cardiology/STEMI.  Signed, [name], PA-C  05/31/2022, 8:18 AM   Cardiology Attending  Patient seen and examined. Agree with the findings as above. On exam he appears confused. "My head is not right." Lungs reveal rales in the based and CV with a RRR. Soft systolic murmur. Ext reveal bilateral BKA's. Tele reveals NSR Review of TEE shows vegetations on his ICD lead as well as on the aortic valve with AI.   A/P ICD lead SBE - this is a very bad situation and I suspect a very likely life ending  problem. He has surgical valve disease and is a poor candidate for AVR. Lead extraction would be difficult. Aortiv valve SBE - as above. He will need for surgical service to make formal rec's. If he is not a candidate for AVR, then its hard to see how a very difficult lead extraction might help him.   [name],MD

## 2022-05-31 NOTE — Progress Notes (Signed)
MD made aware of patients B/P. MD ordered to hold all b/p meds at this time.   05/31/22 0600  Vitals  Temp 98.9 F (37.2 C)  Temp Source Oral  BP (!) 101/48  MAP (mmHg) (!) 64  BP Location Right Arm  BP Method Automatic  Patient Position (if appropriate) Lying  Pulse Rate 92  Pulse Rate Source Dinamap  MEWS COLOR  MEWS Score Color Green  Oxygen Therapy  SpO2 100 %  O2 Device Nasal Cannula  O2 Flow Rate (L/min) 2 L/min  MEWS Score  MEWS Temp 0  MEWS Systolic 0  MEWS Pulse 0  MEWS RR 0  MEWS LOC 0  MEWS Score 0

## 2022-05-31 NOTE — Progress Notes (Signed)
Post Hemodialysis   05/31/22 0143  Vitals  Temp 98.1 F (36.7 C)  Pulse Rate 99  Resp 15  BP 139/75  SpO2 96 %  O2 Device Nasal Cannula  Oxygen Therapy  O2 Flow Rate (L/min) 2 L/min  Patient Activity (if Appropriate) In bed  Pulse Oximetry Type Continuous  Oximetry Probe Site Changed No  Post Treatment  Dialyzer Clearance Lightly streaked  Duration of HD Treatment -hour(s) 3.5 hour(s)  Liters Processed 83.1  Fluid Removed (mL) 1400 mL  Tolerated HD Treatment Yes

## 2022-05-31 NOTE — Progress Notes (Signed)
PT Cancellation Note  Patient Details Name: [name]. MRN: [id number] DOB: 07/22/1955   Cancelled Treatment:    Reason Eval/Treat Not Completed: (P) Fatigue/lethargy limiting ability to participate, pt lethargic from anesthesia s/p TEE. Will check back as schedule allows to continue with PT POC.  Audry Riles. PTA Acute Rehabilitation Services Office: [place] 05/31/2022, 2:48 PM

## 2022-05-31 NOTE — Plan of Care (Signed)
  Problem: Respiratory: Goal: Ability to maintain adequate ventilation will improve Outcome: Progressing   Problem: Clinical Measurements: Goal: Respiratory complications will improve Outcome: Progressing   Problem: Coping: Goal: Level of anxiety will decrease Outcome: Progressing

## 2022-05-31 NOTE — Transfer of Care (Signed)
Immediate Anesthesia Transfer of Care Note  Patient: [name].  Procedure(s) Performed: TRANSESOPHAGEAL ECHOCARDIOGRAM (TEE)  Patient Location: Endoscopy Unit  Anesthesia Type:MAC  Level of Consciousness: sedated  Airway & Oxygen Therapy: Patient Spontanous Breathing and Patient connected to nasal cannula oxygen  Post-op Assessment: Report given to RN and Post -op Vital signs reviewed and stable  Post vital signs: Reviewed and stable  Last Vitals:  Vitals Value Taken Time  BP    Temp    Pulse    Resp    SpO2      Last Pain:  Vitals:   05/31/22 0940  TempSrc: Temporal  PainSc:       Patients Stated Pain Goal: 0 ([phone] [id number])  Complications: No notable events documented.

## 2022-05-31 NOTE — Progress Notes (Signed)
MD and charge nurse notified of patiens HR. Upon reading [facility] patient has not received his Coreg since admission. Spoke to pharmacy about clarification of order, new order in place. Will continue to monitor.   05/31/22 0356  Assess: MEWS Score  Temp 99.9 F (37.7 C)  BP 116/84  MAP (mmHg) 93  Pulse Rate (!) 118  Resp 18  SpO2 100 %  O2 Device Nasal Cannula  O2 Flow Rate (L/min) 2 L/min  Assess: MEWS Score  MEWS Temp 0  MEWS Systolic 0  MEWS Pulse 2  MEWS RR 0  MEWS LOC 0  MEWS Score 2  MEWS Score Color Yellow  Assess: if the MEWS score is Yellow or Red  Were vital signs taken at a resting state? Yes  Focused Assessment No change from prior assessment  Does the patient meet 2 or more of the SIRS criteria? No  MEWS guidelines implemented  Yes, yellow  Treat  MEWS Interventions Considered administering scheduled or prn medications/treatments as ordered  Take Vital Signs  Increase Vital Sign Frequency  Yellow: Q2hr x1, continue Q4hrs until patient remains green for 12hrs  Escalate  MEWS: Escalate Yellow: Discuss with charge nurse and consider notifying provider and/or RRT  Notify: Charge Nurse/RN  Name of Charge Nurse/RN Notified [facility]  Provider Notification  Provider Name/Title Dr.Hall  Date Provider Notified 05/31/22  Time Provider Notified [phone]  Method of Notification Page  Notification Reason Other (Comment) (HR elevated)  Provider response No new orders  Date of Provider Response 05/31/22  Time of Provider Response 0402  Assess: SIRS CRITERIA  SIRS Temperature  0  SIRS Pulse 1  SIRS Respirations  0  SIRS WBC 0  SIRS Score Sum  1

## 2022-05-31 NOTE — Consult Note (Cosign Needed Addendum)
[address]       [place] [address]             [phone]        [name] Pick. [place] Record [id number] Date of Birth: 07/10/1955  Referring: No ref. provider found Primary Care: [name], MD Primary Cardiologist:None  Chief Complaint:    Chief Complaint  Patient presents with   Shortness of Breath    History of Present Illness:     [name] is a 67 year old male with a past medical history of HTN, insulin dependent T2DM, obesity, PVD (s/p bilateral below knee amputations in February 2023), TIA, anemia, and ESRD on hemodialysis (M, W, F). He also has an ICD, placed in 2005 due to non-ischemic cardiomyopathy. He arrived at the ED on 02/20 with a 1 week history of severe fatigue causing him to miss dialysis. He was sent by hemodialysis personnel to the ED due to bacteremia from blood cultures they had taken on 02/15. On arrival to the ED he had leukocytosis with a WBC of 18,500. He was afebrile and met no other SIRS criteria. He was started on empiric antibiotics. Per nephrology his blood cultures grew Staph Lugdunesis, antibiotic regimen was narrowed to cefepime for Staph Lugdunesis bacteremia. Repeat blood cultures on 02/21 has had no growth to date. Echocardiogram on 05/27/22 showed an echodensity concerning for vegetation on the device lead on the atrial side of the tricuspid valve. 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   Tunneled catheter was removed by IR on 02/23 for a line holiday, new tunneled right IJ dialysis catheter was placed on 05/30/22. He received dialysis last night. Per cardiology note the patient refused ICD removal and upon asking he stated he does not want it out right now. Patient  fatigued, resting in bed.    Current Activity/ Functional Status: Patient is not independent with mobility/ambulation, transfers, ADL's, IADL's.   Zubrod Score: At the time of surgery this patient's most appropriate activity status/level should be described as: '[]'$     0    Normal activity, no symptoms '[]'$     1    Restricted in physical strenuous activity but ambulatory, able to do out light work '[]'$     2    Ambulatory and capable of self care, unable to do work activities, up and about                 more than 50%  Of the time                            '[x]'$     3    Only limited self care, in bed greater than 50% of waking hours '[]'$     4    Completely disabled, no self care, confined to bed or chair '[]'$     5    Moribund  Past Medical History:  Diagnosis Date   AICD (automatic cardioverter/defibrillator) present    Anemia    Arthritis    Cerebrovascular disease    CHF (congestive heart failure) ([facility])    Chronic kidney disease    ESRD, MWF HD   COVID 2022   moderate   Diabetes  mellitus    type II   History of TIAs    Hyperlipidemia    Hypertension    Nonischemic cardiomyopathy (H[facility])    PVD (peripheral vascular disease) (HCC)    s/p B BKA   Shortness of breath dyspnea    with exertion    Skin disease    Rare   Stroke ([facility])    "light stroke"   Tobacco abuse     Past Surgical History:  Procedure Laterality Date   A/V FISTULAGRAM Left 06/24/2016   Procedure: A/V Fistulagram;  Surgeon: Elam Dutch, MD;  Location: [facility];  Service: Cardiovascular;  Laterality: Left;   A/V FISTULAGRAM N/A 01/17/2017   Procedure: A/V Fistulagram - left arm;  Surgeon: Serafina Mitchell, MD;  Location: [facility];  Service: Cardiovascular;  Laterality: N/A;   AORTIC ARCH ANGIOGRAPHY N/A 10/21/2021   Procedure: AORTIC ARCH ANGIOGRAPHY;  Surgeon: Marty Heck, MD;  Location: [facility];  Service: Cardiovascular;  Laterality: N/A;   AV FISTULA PLACEMENT Left  04/08/2015   Procedure: Creation of Left arm BRACHIOCEPHALIC ARTERIOVENOUS  FISTULA ;  Surgeon: Mal Misty, MD;  Location: [facility];  Service: Vascular;  Laterality: Left;   AV FISTULA PLACEMENT Left 10/27/2020   Procedure: LEFT ARM ARTERIOVENOUS (AV) FISTULA CREATION;  Surgeon: Rosetta Posner, MD;  Location: [facility];  Service: Vascular;  Laterality: Left;   AV FISTULA PLACEMENT Left 06/29/2021   Procedure: INSERTION OF LEFT ARM ARTERIOVENOUS (AV) GORE-TEX GRAFT;  Surgeon: Rosetta Posner, MD;  Location: [facility];  Service: Vascular;  Laterality: Left;   [place] Left 01/12/2021   Procedure: LEFT ARM SECOND STAGE BASILIC VEIN TRANSPOSITION;  Surgeon: Rosetta Posner, MD;  Location: [facility];  Service: Vascular;  Laterality: Left;   CARDIAC DEFIBRILLATOR [place] EXTRACTION W/PHACO Right 03/17/2015   Procedure: CATARACT EXTRACTION PHACO AND INTRAOCULAR LENS PLACEMENT ([place]);  Surgeon: Rutherford Guys, MD;  Location: [facility];  Service: Ophthalmology;  Laterality: Right;  CDE: 6.59   EXCHANGE OF A DIALYSIS CATHETER Right 02/08/2022   Procedure: EXCHANGE OF A TUNNELED DIALYSIS CATHETER;  Surgeon: Rosetta Posner, MD;  Location: [facility];  Service: Vascular;  Laterality: Right;   EYE SURGERY Right    Cataract   IR FLUORO GUIDE CV LINE RIGHT  05/26/2021   IR FLUORO GUIDE CV LINE RIGHT  05/30/2022   IR PTA VENOUS EXCEPT DIALYSIS CIRCUIT  05/26/2021   IR US GUIDE VASC ACCESS RIGHT  05/30/2022   LEG AMPUTATION BELOW KNEE     bilateral   LIGATION ARTERIOVENOUS GORTEX GRAFT Left 09/14/2021   Procedure: LIGATION OF LEFT ARM ARTERIOVENOUS GORTEX GRAFT;  Surgeon: Broadus John, MD;  Location: [facility];  Service: Vascular;  Laterality: Left;  PERIPHERAL NERVE BLOCK   PERIPHERAL VASCULAR INTERVENTION  01/17/2017   Procedure: PERIPHERAL VASCULAR INTERVENTION;  Surgeon: Serafina Mitchell, MD;  Location: [facility];  Service: Cardiovascular;;  PTA  left arm  fistula   UPPER EXTREMITY ANGIOGRAPHY N/A 10/21/2021   Procedure: Upper Extremity Angiography;  Surgeon: Marty Heck, MD;  Location: [facility];  Service: Cardiovascular;  Laterality: N/A;   WOUND DEBRIDEMENT Left 02/18/2021   Procedure: LEFT ARM DEBRIDEMENT;  Surgeon: Serafina Mitchell, MD;  Location: [facility];  Service: Vascular;  Laterality: Left;    Social History   Tobacco Use  Smoking Status Former  Packs/day: 1.00   Years: 20.00   Total pack years: 20.00   Types: Cigarettes   Quit date: 03/12/1998   Years since quitting: 24.2   Passive exposure: Never  Smokeless Tobacco Never    Social History   Substance and Sexual Activity  Alcohol Use Not Currently   Comment: occasional     Allergies  Allergen Reactions   Contrast Media [Iodinated Contrast Media] Hives and Other (See Comments)    Happened "in the 80's"   Other Other (See Comments)    "Transfer Dye" = "Makes me tired"   Acetazolamide Anxiety and Other (See Comments)    Jittery, odd feeling (hyper feeling)    Current Facility-Administered Medications  Medication Dose Route Frequency Provider Last Rate Last Admin   acetaminophen (TYLENOL) tablet 650 mg  650 mg Oral Q6H PRN Opyd, Ilene Qua, MD   650 mg at 05/31/22 [id number]   Or   acetaminophen (TYLENOL) suppository 650 mg  650 mg Rectal Q6H PRN Opyd, Ilene Qua, MD       albumin human 25 % solution 25 g  25 g Intravenous Q1H PRN Donato Heinz, MD 60 mL/hr at 05/27/22 0930 25 g at 05/27/22 0930   amLODipine (NORVASC) tablet 10 mg  10 mg Oral QHS Opyd, Ilene Qua, MD   10 mg at 05/31/22 [id number]   aspirin chewable tablet 81 mg  81 mg Oral QHS Opyd, Ilene Qua, MD   81 mg at 05/31/22 0223   brimonidine (ALPHAGAN) 0.2 % ophthalmic solution 1 drop  1 drop Right Eye TID Vianne Bulls, MD   1 drop at 05/30/22 1606   calcitRIOL (ROCALTROL) capsule 2.25 mcg  2.25 mcg Oral Q M,W,F Rosita Fire, MD   2.25 mcg at 05/30/22 1334   calcium acetate (PHOSLO)  capsule 1,334 mg  1,334 mg Oral TID WC Claudia Desanctis, MD   1,334 mg at 05/30/22 1659   [START ON 06/01/2022] carvedilol (COREG) tablet 12.5 mg  12.5 mg Oral Q M,W,F-2000 Thurnell Lose, MD       carvedilol (COREG) tablet 12.5 mg  12.5 mg Oral 2 times per day on Sun Tue Thu Sat Thurnell Lose, MD   12.5 mg at 05/31/22 0820   [START ON 06/01/2022] ceFAZolin (ANCEF) IVPB 2g/100 mL premix  2 g Intravenous Q M,W,F-HD Levada Dy, Dwayne A, RPH       Chlorhexidine Gluconate Cloth 2 % PADS 6 each  6 each Topical Daily Antonieta Pert, MD   6 each at 05/30/22 0841   Chlorhexidine Gluconate Cloth 2 % PADS 6 each  6 each Topical Q0600 Claudia Desanctis, MD   6 each at 05/31/22 0520   Chlorhexidine Gluconate Cloth 2 % PADS 6 each  6 each Topical Q0600 Rosita Fire, MD       cinacalcet [facility]) tablet 30 mg  30 mg Oral Q M,W,F Rosita Fire, MD   30 mg at 05/30/22 1336   cloNIDine (CATAPRES) tablet 0.1 mg  0.1 mg Oral QHS Opyd, Ilene Qua, MD   0.1 mg at 05/31/22 [id number]   clopidogrel (PLAVIX) tablet 75 mg  75 mg Oral QHS Vianne Bulls, MD   75 mg at 05/29/22 2101   cyanocobalamin (VITAMIN B12) injection 1,000 mcg  1,000 mcg Intramuscular Daily Thurnell Lose, MD       [START ON 06/03/2022] cyanocobalamin (VITAMIN B12) tablet 1,000 mcg  1,000 mcg Oral Daily Thurnell Lose, MD  Darbepoetin Alfa (ARANESP) injection 150 mcg  150 mcg Subcutaneous Q Mon-1800 Claudia Desanctis, MD   150 mcg at 0000000 [id number]   folic acid (FOLVITE) tablet 1 mg  1 mg Oral QHS Opyd, Ilene Qua, MD   1 mg at 05/31/22 0222   gabapentin (NEURONTIN) capsule 300 mg  300 mg Oral QHS Opyd, Ilene Qua, MD   300 mg at 05/31/22 0222   heparin injection 5,000 Units  5,000 Units Subcutaneous Q8H Vianne Bulls, MD   5,000 Units at 05/31/22 [id number]   hydrALAZINE (APRESOLINE) injection 10 mg  10 mg Intravenous Q6H PRN Thurnell Lose, MD       hydrALAZINE (APRESOLINE) tablet 25 mg  25 mg Oral 2 times per day on Sun Tue Thu Sat Thurnell Lose, MD       [START ON 06/01/2022] hydrALAZINE (APRESOLINE) tablet 25 mg  25 mg Oral Q M,W,F-2000 Thurnell Lose, MD       insulin aspart (novoLOG) injection 0-6 Units  0-6 Units Subcutaneous TID WC Opyd, Ilene Qua, MD   1 Units at 05/31/22 0820   insulin aspart (novoLOG) injection 3 Units  3 Units Subcutaneous TID WC Thurnell Lose, MD   3 Units at 05/30/22 1700   insulin glargine-yfgn (SEMGLEE) injection 10 Units  10 Units Subcutaneous QHS Opyd, Ilene Qua, MD   10 Units at 05/31/22 0229   ondansetron (ZOFRAN) tablet 4 mg  4 mg Oral Q6H PRN Opyd, Ilene Qua, MD       Or   ondansetron (ZOFRAN) injection 4 mg  4 mg Intravenous Q6H PRN Opyd, Ilene Qua, MD       oxyCODONE (Oxy IR/ROXICODONE) immediate release tablet 5 mg  5 mg Oral Q4H PRN Opyd, Ilene Qua, MD   5 mg at 05/31/22 0223   pantoprazole (PROTONIX) EC tablet 40 mg  40 mg Oral BID Thurnell Lose, MD   40 mg at 05/30/22 1606   rosuvastatin (CRESTOR) tablet 20 mg  20 mg Oral QHS Opyd, Ilene Qua, MD   20 mg at 05/31/22 0222   sodium chloride flush (NS) 0.9 % injection 3 mL  3 mL Intravenous Q12H Opyd, Ilene Qua, MD   3 mL at 05/31/22 0229    Medications Prior to Admission  Medication Sig Dispense Refill Last Dose   amLODipine (NORVASC) 10 MG tablet Take 10 mg by mouth at bedtime.  1 05/23/2022   aspirin 81 MG tablet Take 81 mg by mouth at bedtime.   05/23/2022   brimonidine (ALPHAGAN) 0.2 % ophthalmic solution Place 1 drop into the right eye 3 (three) times daily.   Past Week   calcium acetate (PHOSLO) 667 MG capsule Take 667-1,334 mg by mouth See admin instructions. Take 1,334 mg by mouth three times a day with meals and 667 mg with each snack  0 Past Week   carvedilol (COREG) 12.5 MG tablet take 1 tablet by mouth twice daily.  DO NOT TAKE IN THE MORNING ON DIALYSIS DAYS (Patient taking differently: Take 12.5 mg by mouth See admin instructions. Take 12.5 mg by mouth two times a day and DO NOT TAKE IN THE MORNING ON DIALYSIS DAYS)  60 tablet 0 Past Week at PM   cloNIDine (CATAPRES) 0.1 MG tablet Take 1 tablet (0.1 mg total) by mouth at bedtime. (Patient taking differently: Take 0.1 mg by mouth 2 (two) times daily.)   Past Week   clopidogrel (PLAVIX) 75 MG tablet Take 75 mg by mouth  at bedtime.   05/23/2022 at 2300   fluticasone (FLONASE) 50 MCG/ACT nasal spray Place 1 spray into both nostrils at bedtime as needed for allergies or rhinitis.   unknown   folic acid (FOLVITE) 1 MG tablet Take 1 mg by mouth at bedtime.   Past Week   furosemide (LASIX) 20 MG tablet Take 60 mg by mouth 2 (two) times daily.   Past Week   gabapentin (NEURONTIN) 300 MG capsule TAKE 1 CAPSULE(300 MG) BY MOUTH DAILY (Patient taking differently: Take 300 mg by mouth at bedtime.) 60 capsule 0 05/23/2022   hydrALAZINE (APRESOLINE) 25 MG tablet Take 1 tablet (25 mg total) by mouth in the morning and at bedtime. (Patient taking differently: Take 25 mg by mouth See admin instructions. Taking twice daily except on Dialysis on Mon, Wed, Friday only take once daily after dialysis)   Past Week   HYDROcodone-acetaminophen (NORCO/VICODIN) 5-325 MG tablet Take one tab po q 4 hrs prn pain 10 tablet 0 05/24/2022   icosapent Ethyl (VASCEPA) 1 g capsule Take 2 g by mouth 2 (two) times daily.   05/23/2022   insulin lispro (HUMALOG) 100 UNIT/ML KiwkPen Inject 15-20 Units into the skin 3 (three) times daily before meals. 15 units on Dialysis days Mon, Wed, Friday   05/23/2022   LANTUS SOLOSTAR 100 UNIT/ML Solostar Pen Inject 20 Units into the skin at bedtime. (Patient taking differently: Inject 50 Units into the skin at bedtime.) 15 mL 11 05/23/2022   rosuvastatin (CRESTOR) 20 MG tablet Take 20 mg by mouth at bedtime.  0 Past Week   triamcinolone cream (KENALOG) 0.1 % Apply 1 Application topically 2 (two) times daily.   unknown   methocarbamol (ROBAXIN) 500 MG tablet Take 1 tablet (500 mg total) by mouth 3 (three) times daily. (Patient not taking: Reported on 05/24/2022) 15 tablet 0  Completed Course    Family History  Problem Relation Age of Onset   Diabetes Father    Stroke Father    Diabetes Brother    Diabetes Brother    Diabetes Brother    Diabetes Brother    Heart failure Mother    Diabetes Mother    Diabetes Other    Coronary artery disease Other      Review of Systems:  Review of Systems  Constitutional:  Positive for malaise/fatigue. Negative for chills and fever.  HENT:  Negative for hearing loss and tinnitus.   Eyes:  Negative for blurred vision and double vision.  Respiratory:  Positive for cough. Negative for shortness of breath.   Cardiovascular:  Negative for chest pain.  Gastrointestinal:  Negative for constipation and diarrhea.  Musculoskeletal:  Positive for neck pain.  Neurological:  Negative for weakness.       "Light stroke" in the past  Sees the dentist "every once in a while"  Physical Exam: BP (!) 114/55 (BP Location: Right Arm)   Pulse 67   Temp (!) 97.5 F (36.4 C) (Oral)   Resp 18   Ht 6' (1.829 m)   Wt 113 kg   SpO2 100%   BMI 33.79 kg/m   General appearance: Chronically ill, fatigued, lethargic and no distress Head: Normocephalic, without obvious abnormality, atraumatic Neck: no adenopathy, no carotid bruit, no JVD, supple, symmetrical, trachea midline, and thyroid not enlarged, symmetric, no tenderness/mass/nodules, no neck tenderness Lymph nodes: Cervical, supraclavicular, and axillary nodes normal. Resp: Diminished breath sounds bibasilar, otherwise clear to auscultation Cardio: regular rate and rhythm, no murmur or rub GI: soft,  non-tender; bowel sounds normal; no masses,  no organomegaly Extremities: bilateral bka, no edema, radial pulses 2+, scaly plaques on bilateral arms Neurologic: Grossly intact  Diagnostic Studies & Radiology Findings:  ECHOCARDIOGRAM REPORT    Patient Name:   [name] Pick. Date of Exam: 05/27/2022 Medical Rec #:  [id number]         Height:       72.0 in Accession #:     [id number]        Weight:       249.1 lb Date of Birth:  Jun 11, 1955         BSA:          2.339 m Patient Age:    63 years          BP:           140/62 mmHg Patient Gender: M                 HR:           83 bpm. Exam Location:  [facility]  Procedure: 2D Echo, Cardiac Doppler and Color Doppler  Indications:    Bacteremia R78.81   History:        Patient has prior history of Echocardiogram examinations, most                 recent 06/19/2016. CHF and Cardiomyopathy, TIA; Risk                 Factors:Dyslipidemia, Diabetes and Hypertension. ESRD (end stage                 renal disease) on dialysis, COVID-19 virus infection, AICD                 (automatic cardioverter/defibrillator) present (From Hx).   Sonographer:    Alvino Chapel RCS Referring Phys: [id number] [place]  IMPRESSIONS    1. Left ventricular ejection fraction, by estimation, is 45 to 50%. The left ventricle has mildly decreased function. The left ventricle demonstrates regional wall motion abnormalities (see scoring diagram/findings for description). There is moderate concentric left ventricular hypertrophy. Left ventricular diastolic parameters are consistent with Grade I diastolic dysfunction (impaired relaxation).  2. Right ventricular systolic function is normal. The right ventricular size is normal. There is normal pulmonary artery systolic pressure.  3. The mitral valve is degenerative. Trivial mitral valve regurgitation.  4. The aortic valve is tricuspid. There is mild calcification of the aortic valve. There is mild thickening of the aortic valve. Aortic valve regurgitation is not visualized. Mild aortic valve stenosis. Aortic valve mean gradient measures 11.0 mmHg.  5. Aortic dilatation noted. There is mild dilatation of the aortic root, measuring 41 mm.  6. The inferior vena cava is normal in size with greater than 50% respiratory variability, suggesting right atrial pressure of 3 mmHg.  7. Device lead  present in RA/RV with thickening and echodensity (approximately 1cm x 2cm) noted on atrial side of tricuspid valve concerning for vegetation in the setting of known bacteremia. TEE indicated for further evaluation as lead extraction may need to be considered.  Comparison(s): Prior images unable to be directly viewed.  FINDINGS  Left Ventricle: Left ventricular ejection fraction, by estimation, is 45 to 50%. The left ventricle has mildly decreased function. The left ventricle demonstrates regional wall motion abnormalities. Definity contrast agent was given IV to delineate the left ventricular endocardial borders. The left ventricular internal cavity size was normal in size. There is  moderate concentric left ventricular hypertrophy. Left ventricular diastolic parameters are consistent with Grade I diastolic dysfunction (impaired relaxation).    LV Wall Scoring: The basal inferolateral segment, basal inferior segment, and basal inferoseptal segment are hypokinetic. The entire anterior wall, antero-lateral wall, mid and distal lateral wall, entire anterior septum, entire apex, mid and distal inferior wall, and mid inferoseptal segment are normal.  Right Ventricle: The right ventricular size is normal. No increase in right ventricular wall thickness. Right ventricular systolic function is normal. There is normal pulmonary artery systolic pressure. The tricuspid regurgitant velocity is 2.20 m/s, and  with an assumed right atrial pressure of 3 mmHg, the estimated right ventricular systolic pressure is [id number] mmHg.  Left Atrium: Left atrial size was normal in size.  Right Atrium: Right atrial size was normal in size.  Pericardium: There is no evidence of pericardial effusion.  Mitral Valve: The mitral valve is degenerative in appearance. There is mild thickening of the mitral valve leaflet(s). There is mild calcification of the mitral valve leaflet(s). Trivial mitral  valve regurgitation.  Tricuspid Valve: The tricuspid valve is grossly normal. Tricuspid valve regurgitation is mild.  Aortic Valve: The aortic valve is tricuspid. There is mild calcification of the aortic valve. There is mild thickening of the aortic valve. There is mild to moderate aortic valve annular calcification. Aortic valve regurgitation is not visualized. Mild aortic stenosis is present. Aortic valve mean gradient measures 11.0 mmHg. Aortic valve peak gradient measures 22.2 mmHg. Aortic valve area, by VTI measures 1.67 cm.  Pulmonic Valve: The pulmonic valve was grossly normal. Pulmonic valve regurgitation is trivial.  Aorta: Aortic dilatation noted. There is mild dilatation of the aortic root, measuring 41 mm.  Venous: The inferior vena cava is normal in size with greater than 50% respiratory variability, suggesting right atrial pressure of 3 mmHg.  IAS/Shunts: No atrial level shunt detected by color flow Doppler.    LEFT VENTRICLE PLAX 2D LVIDd:         5.20 cm   Diastology LVIDs:         3.30 cm   LV e' medial:    3.26 cm/s LV PW:         1.40 cm   LV E/e' medial:  27.2 LV IVS:        1.30 cm   LV e' lateral:   9.68 cm/s LVOT diam:     2.20 cm   LV E/e' lateral: 9.2 LV SV:         76 LV SV Index:   33 LVOT Area:     3.80 cm    RIGHT VENTRICLE RV S prime:     15.10 cm/s TAPSE (M-mode): 2.0 cm  LEFT ATRIUM             Index        RIGHT ATRIUM           Index LA diam:        4.00 cm 1.71 cm/m   RA Area:     20.60 cm LA Vol (A2C):   64.4 ml 27.53 ml/m  RA Volume:   67.70 ml  28.94 ml/m LA Vol (A4C):   67.5 ml 28.86 ml/m LA Biplane Vol: 71.7 ml 30.65 ml/m  AORTIC VALVE AV Area (Vmax):    1.57 cm AV Area (Vmean):   1.60 cm AV Area (VTI):     1.67 cm AV Vmax:           235.33 cm/s AV  Vmean:          154.667 cm/s AV VTI:            0.454 m AV Peak Grad:      22.2 mmHg AV Mean Grad:      11.0 mmHg LVOT Vmax:         97.50 cm/s LVOT Vmean:         65.000 cm/s LVOT VTI:          0.200 m LVOT/AV VTI ratio: 0.44   AORTA Ao Root diam: 4.10 cm  MITRAL VALVE                TRICUSPID VALVE MV Area (PHT): 4.80 cm     TR Peak grad:   19.4 mmHg MV Decel Time: 158 msec     TR Vmax:        220.00 cm/s MV E velocity: 88.60 cm/s MV A velocity: 115.50 cm/s  SHUNTS MV E/A ratio:  0.77         Systemic VTI:  0.20 m                             Systemic Diam: 2.20 cm  Rozann Lesches MD Electronically signed by Rozann Lesches MD Signature Date/Time: 05/27/2022/5:46:15 PM    Final     Assessment & Plan: Tricuspid valve endocarditis, aortic root abscess, aortic insufficiency: Chronically ill patient, does not seem to be a good candidate for surgical intervention. Dr. Tenny Craw to determine surgical candidacy.   Multiple ICD lead vegetations: According to cardiology note and patient, he does not want ICD removed at this time.   S lugdunesi bacteremia: On IV Cefazolin  ESRD on dialysis: HD on M,W,F  HTN: Controlled on Coreg, Clonidine, Hydralazine, and Norvasc   HLD: On Rosuvastatin   T2DM: Insulin dependent, CBGs controlled on SSI and Semglee 10U  Magdalene River, PA-C 05/31/22   46M on dialysis for 6 years now with complex endocarditis with AI and root abscess. Very high risk for surgery and also medical treatment. Risk/benefit/alternatives to surgical care explained at length. Patient refusing any intervention including lead removal.  He understands after conversation that this may well be a life-ending process despite medication.

## 2022-05-31 NOTE — Interval H&P Note (Signed)
History and Physical Interval Note:  05/31/2022 10:34 AM  [name] Pick.  has presented today for surgery, with the diagnosis of BACTEREMIA.  The various methods of treatment have been discussed with the patient and family. After consideration of risks, benefits and other options for treatment, the patient has consented to  Procedure(s): TRANSESOPHAGEAL ECHOCARDIOGRAM (TEE) (N/A) as a surgical intervention.  The patient's history has been reviewed, patient examined, no change in status, stable for surgery.  I have reviewed the patient's chart and labs.  Questions were answered to the patient's satisfaction.     Donato Heinz

## 2022-05-31 NOTE — Progress Notes (Signed)
[facility] NEPHROLOGY PROGRESS NOTE  Assessment/ Plan: Pt is a 67 y.o. yo male   Dialysis Orders:  Center: [facility]  on MWF . EDW 116.5kg, Bath 2K/2.5Ca,  Time 4 hours ,Access RIJ TDC  BFR 400 DFR 500 UF profile 2    Meds: mircera 75 mcg every 2 weeks (due again on 2/26) Calcitriol 2.25 mcg three times a week Senispar 30 mg in center three times a week Heparin 2000 units IVP then 1000 units/hr.   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  On IV Ancef and repeat cultures are negative so far.  New catheter placed on 2/26.  Possibly TEE today.  # ESRD on HD, MWF: New tunneled HD catheter placed on 05/30/2022 by IR.  Status post HD yesterday with 2 L ultrafiltration, tolerated well.  Plan for regular dialysis tomorrow.  # Anemia: Received Aranesp on 2/26, continue with.Monitor hemoglobin.  # Secondary hyperparathyroidism: Resume calcitriol, Sensipar and continue PhosLo for hyperphosphatemia.  Calcium and phosphorus level at goal.  # HTN/volume: Blood pressure variable.  Continue current antihypertensives and UF with HD.  # Hyponatremia, hypervolemic: Continue fluid restriction and ultrafiltration during HD.  Subjective: Seen and examined at bedside.  No major health issues overnight.  Denies nausea, vomiting, chest pain, shortness of breath.  Objective Vital signs in last 24 hours: Vitals:   05/31/22 0356 05/31/22 0600 05/31/22 0913 05/31/22 0940  BP: 116/84 (!) 101/48 104/60 106/62  Pulse: (!) 118 92 79 (!) 45  Resp: '18  19 12  '$ Temp: 99.9 F (37.7 C) 98.9 F (37.2 C) 98.4 F (36.9 C) (!) 97.4 F (36.3 C)  TempSrc: Oral Oral Oral Temporal  SpO2: 100% 100% 100% 99%  Weight:    113 kg  Height:    6' (1.829 m)   Weight change:   Intake/Output Summary (Last 24 hours) at 05/31/2022 0942 Last data filed at 05/31/2022 0143 Gross per 24 hour  Intake 200 ml   Output 1400 ml  Net -1200 ml        Labs: RENAL PANEL Recent Labs    10/21/21 1006 02/08/22 0857 05/24/22 1725 05/25/22 0517 05/26/22 0513 05/27/22 0427 05/28/22 1151 05/29/22 0414 05/30/22 0047 05/31/22 0417  NA 139 138 131* 132* 132* 131* 131* 131* 128* 132*  K 4.3 5.0 4.0 4.1 3.8 3.7 4.5 3.8 4.1 3.9  CL 101 102 92* 95* 95* 95* 94* 93* 90* 93*  CO2  --   --  22 21* 23 23 21* '22 23 22  '$ GLUCOSE 76 247* 140* 134* 156* 150* 210* 183* 178* 196*  BUN 32* 40* 86* 94* 56* 69* 45* 50* 57* 33*  CREATININE 7.10* 8.30* 9.50* 9.91* 6.44* 7.92* 5.99* 7.09* 8.03* 5.19*  CALCIUM  --   --  9.1 8.9 8.6* 9.0 8.9 9.3 9.5 9.2  MG  --   --  2.1  --   --   --   --   --   --   --   PHOS  --   --   --  6.4*  --   --   --  4.5 5.1* 3.4  ALBUMIN  --   --  2.3* 2.1*  --   --   --  2.1* 2.1* 2.2*      Liver Function Tests: Recent Labs  Lab 05/24/22 1725 05/25/22 [id number] 05/29/22 0414 05/30/22 0047 05/31/22 0417  AST 47* 33  --   --   --   ALT 28 24  --   --   --   ALKPHOS 71 66  --   --   --   BILITOT 1.3* 1.3*  --   --   --   PROT 5.7* 5.5*  --   --   --   ALBUMIN 2.3* 2.1* 2.1* 2.1* 2.2*    No results for input(s): "LIPASE", "AMYLASE" in the last 168 hours. No results for input(s): "AMMONIA" in the last 168 hours. CBC: Recent Labs    05/25/22 0517 05/26/22 0513 05/27/22 0427 05/28/22 1151 05/29/22 0409 05/30/22 0047 05/30/22 0746 05/31/22 0417  HGB 7.1*   < > 8.5* 8.8* 8.0* 7.7*  --  8.7*  MCV 91.8   < > 92.1 87.7 93.4 93.1  --  92.6  VITAMINB12 268  --   --   --   --   --  288  --   FOLATE 25.3  --   --   --   --   --  34.5  --   FERRITIN 4,259*  --   --   --   --   --  3,315*  --   TIBC NOT CALCULATED  --   --   --   --   --  119*  --   IRON 42*  --   --   --   --   --  39*  --   RETICCTPCT 2.5  --   --   --   --   --  3.5*  --    < > = values in this interval not displayed.     Cardiac Enzymes: No results for input(s): "CKTOTAL", "CKMB", "CKMBINDEX", "TROPONINI"  in the last 168 hours. CBG: Recent Labs  Lab 05/30/22 1314 05/30/22 1633 05/30/22 2121 05/31/22 0229 05/31/22 0726  GLUCAP 159* 196* 204* 173* 173*     Iron Studies:  Recent Labs    05/30/22 0746  IRON 39*  TIBC 119*  FERRITIN 3,315*    Studies/Results: IR US Guide Vasc Access Right  Result Date: 05/30/2022 CLINICAL DATA:  End-stage renal disease and removal of previously placed right IJ tunneled hemodialysis catheter on 05/27/2022 due to bacteremia. The patient has now been satisfactory treated for bacteremia and requires a new tunneled hemodialysis catheter for continued hemodialysis needs. EXAM: TUNNELED CENTRAL VENOUS HEMODIALYSIS CATHETER PLACEMENT WITH ULTRASOUND AND FLUOROSCOPIC GUIDANCE ANESTHESIA/SEDATION: Moderate (conscious) sedation was employed during this procedure. A total of Versed 1.0 mg and Fentanyl 50 mcg was administered intravenously by radiology nursing. Moderate Sedation Time: 27 minutes. The patient's level of consciousness and vital signs were monitored continuously by radiology nursing throughout the procedure under my direct supervision. MEDICATIONS: 2 g IV Ancef. FLUOROSCOPY: 5 minutes and 30 seconds.  130 mGy. PROCEDURE: The procedure, risks, benefits, and alternatives were explained to the patient. Questions regarding the procedure were encouraged and answered. The patient understands and consents to the procedure. A timeout was performed prior to initiating the procedure. A time-out was performed prior to initiating the procedure. The right neck and chest were prepped with chlorhexidine in a sterile fashion, and a sterile drape was applied covering the operative field. Maximum barrier sterile technique with sterile gowns and gloves were used for the procedure. Local anesthesia was provided with 1% lidocaine. Ultrasound was used to confirm patency of the right internal jugular vein. A permanent ultrasound image was saved and recorded. After creating  a small  venotomy incision, a 21 gauge needle was advanced into the right internal jugular vein under direct, real-time ultrasound guidance. Ultrasound image documentation was performed. After securing guidewire access, an 8 Fr dilator was placed. A J-wire was kinked to measure appropriate catheter length. A Palindrome tunneled hemodialysis catheter measuring 19 cm from tip to cuff was chosen for placement. This was tunneled in a retrograde fashion from the chest wall to the venotomy incision. At the venotomy, serial dilatation was performed and a 15 Fr peel-away sheath was placed over a guidewire. The catheter was then placed through the sheath and the sheath removed. Final catheter positioning was confirmed and documented with a fluoroscopic spot image. The catheter was aspirated, flushed with saline, and injected with appropriate volume heparin dwells. The venotomy incision was closed with subcuticular 4-0 Vicryl. Dermabond was applied to the incision. The catheter exit site was secured with 0-Prolene retention sutures. COMPLICATIONS: None.  No pneumothorax. FINDINGS: After catheter placement, the tip lies in the right atrium. The catheter aspirates normally and is ready for immediate use. IMPRESSION: Placement of tunneled hemodialysis catheter via the right internal jugular vein. The catheter tip lies in the right atrium. The catheter is ready for immediate use. Electronically Signed   By: Aletta Edouard M.D.   On: 05/30/2022 13:25   IR Fluoro Guide CV Line Right  Result Date: 05/30/2022 CLINICAL DATA:  End-stage renal disease and removal of previously placed right IJ tunneled hemodialysis catheter on 05/27/2022 due to bacteremia. The patient has now been satisfactory treated for bacteremia and requires a new tunneled hemodialysis catheter for continued hemodialysis needs. EXAM: TUNNELED CENTRAL VENOUS HEMODIALYSIS CATHETER PLACEMENT WITH ULTRASOUND AND FLUOROSCOPIC GUIDANCE ANESTHESIA/SEDATION: Moderate (conscious)  sedation was employed during this procedure. A total of Versed 1.0 mg and Fentanyl 50 mcg was administered intravenously by radiology nursing. Moderate Sedation Time: 27 minutes. The patient's level of consciousness and vital signs were monitored continuously by radiology nursing throughout the procedure under my direct supervision. MEDICATIONS: 2 g IV Ancef. FLUOROSCOPY: 5 minutes and 30 seconds.  130 mGy. PROCEDURE: The procedure, risks, benefits, and alternatives were explained to the patient. Questions regarding the procedure were encouraged and answered. The patient understands and consents to the procedure. A timeout was performed prior to initiating the procedure. A time-out was performed prior to initiating the procedure. The right neck and chest were prepped with chlorhexidine in a sterile fashion, and a sterile drape was applied covering the operative field. Maximum barrier sterile technique with sterile gowns and gloves were used for the procedure. Local anesthesia was provided with 1% lidocaine. Ultrasound was used to confirm patency of the right internal jugular vein. A permanent ultrasound image was saved and recorded. After creating a small venotomy incision, a 21 gauge needle was advanced into the right internal jugular vein under direct, real-time ultrasound guidance. Ultrasound image documentation was performed. After securing guidewire access, an 8 Fr dilator was placed. A J-wire was kinked to measure appropriate catheter length. A Palindrome tunneled hemodialysis catheter measuring 19 cm from tip to cuff was chosen for placement. This was tunneled in a retrograde fashion from the chest wall to the venotomy incision. At the venotomy, serial dilatation was performed and a 15 Fr peel-away sheath was placed over a guidewire. The catheter was then placed through the sheath and the sheath removed. Final catheter positioning was confirmed and documented with a fluoroscopic spot image. The catheter was  aspirated, flushed with saline, and injected with appropriate volume heparin  dwells. The venotomy incision was closed with subcuticular 4-0 Vicryl. Dermabond was applied to the incision. The catheter exit site was secured with 0-Prolene retention sutures. COMPLICATIONS: None.  No pneumothorax. FINDINGS: After catheter placement, the tip lies in the right atrium. The catheter aspirates normally and is ready for immediate use. IMPRESSION: Placement of tunneled hemodialysis catheter via the right internal jugular vein. The catheter tip lies in the right atrium. The catheter is ready for immediate use. Electronically Signed   By: Aletta Edouard M.D.   On: 05/30/2022 13:25    Medications: Infusions:  sodium chloride Stopped (05/30/22 1310)   [MAR Hold] albumin human 25 g (05/27/22 0930)   [MAR Hold]  ceFAZolin (ANCEF) IV      Scheduled Medications:  [MAR Hold] amLODipine  10 mg Oral QHS   [MAR Hold] aspirin  81 mg Oral QHS   [MAR Hold] brimonidine  1 drop Right Eye TID   [MAR Hold] calcitRIOL  2.25 mcg Oral Q M,W,F   [MAR Hold] calcium acetate  1,334 mg Oral TID WC   [MAR Hold] carvedilol  12.5 mg Oral Q M,W,F-2000   [MAR Hold] carvedilol  12.5 mg Oral 2 times per day on Sun Tue Thu Sat   [facility] Hold] Chlorhexidine Gluconate Cloth  6 each Topical Daily   [MAR Hold] Chlorhexidine Gluconate Cloth  6 each Topical Q0600   [MAR Hold] cinacalcet  30 mg Oral Q M,W,F   [MAR Hold] cloNIDine  0.1 mg Oral QHS   [MAR Hold] clopidogrel  75 mg Oral QHS   [MAR Hold] cyanocobalamin  1,000 mcg Intramuscular Daily   [MAR Hold] vitamin B-12  1,000 mcg Oral Daily   [MAR Hold] darbepoetin (ARANESP) injection - NON-DIALYSIS  150 mcg Subcutaneous Q [id number]   [MAR Hold] folic acid  1 mg Oral QHS   [MAR Hold] gabapentin  300 mg Oral QHS   [MAR Hold] heparin  5,000 Units Subcutaneous Q8H   [MAR Hold] hydrALAZINE  25 mg Oral 2 times per day on Sun Tue Thu Sat   [facility] Hold] hydrALAZINE  25 mg Oral Q M,W,F-2000   [MAR  Hold] insulin aspart  0-6 Units Subcutaneous TID WC   [MAR Hold] insulin aspart  3 Units Subcutaneous TID WC   [MAR Hold] insulin glargine-yfgn  10 Units Subcutaneous QHS   [MAR Hold] pantoprazole  40 mg Oral BID   [MAR Hold] rosuvastatin  20 mg Oral QHS   [MAR Hold] sodium chloride flush  3 mL Intravenous Q12H    have reviewed scheduled and prn medications.  Physical Exam: General:NAD, comfortable Heart:RRR, s1s2 nl Lungs:clear b/l, no crackle Abdomen:soft, Non-tender, non-distended Extremities: Bilateral BKA, trace stump edema Dialysis Access: Right IJ TDC in place, site clean.  No bleeding.  [name] 05/31/2022,9:42 AM  LOS: 6 days

## 2022-05-31 NOTE — Progress Notes (Signed)
PROGRESS NOTE                                                                                                                                                                                                             Patient Demographics:    Zaevion Kenoyer, is a 67 y.o. male, DOB - [birth year], AV:[id number]  Outpatient Primary MD for the patient is [name], MD    LOS - 6  Admit date - 05/24/2022    Chief Complaint  Patient presents with   Shortness of Breath       Brief Narrative (HPI from H&P)   17 yom w/ hypertension,IDDM,PAD,anemia, pacemaker, and ESRD on HD, sent from dialysis for evaluation of bacteremia.   Patient severely fatigued for the past 1 week, missed dialysis 2/20 for this reason, and was contacted by dialysis personnel, informed that he had an infection in his blood, and was directed to the ED. No fever, chills, cough, dysuria, abdominal pain, vomiting, diarrhea, neck stiffness, or new rash. In EH:[id number] and saturating mid 90s on room air. CT C/A/P notable for mild skin thickening and edema of the abdominal wall without gas or abscess, left renal lesion, right greater than left gynecomastia, and other nonacute findings.    He was admitted to the hospital for bacteremia in the presence of dialysis tunneled catheter, AICD, seen by nephrology, ID and EP.  Currently on IV antibiotics awaiting further workup.  He was transferred under my care on 05/30/2022 on day 5 of hospital stay.   Subjective:   Patient in bed, appears comfortable, denies any headache, no fever, no chest pain or pressure, no shortness of breath , no abdominal pain. No focal weakness.   Assessment  & Plan :     Bacteremia with HD center blood cx growing [place] with history of HD catheter and AICD device-  Sent by HD to the ED due to bacteremia from blood cultures on 2/15, underwent echocardiogram showing - Device lead present in RA/RV  with thickening and echodensity (approximately 1cm x 2cm) noted on atrial side of tricuspid valve concerning for vegetation in the setting of known bacteremia.   ID monitoring remotely, renal and EP following have requested ID to do a formal consult on 05/30/2022.  Underwent tunneled catheter removal by IR on 05/27/2022, new tunneled right IJ dialysis catheter  was placed on 05/30/2022 after line holidays.  On IV Ancef, repeat cultures thus far negative appears nontoxic, unfortunately patient refused AICD removal which could be potentially infected and hence TEE was canceled by cardiology, continue medical treatment.  Patient strictly counseled that this is not the ideal treatment and can lead to worsening of infection, sepsis and death.  He understands this and assumes all responsibility.  Class 1 obesity due to excess calories with body mass index (BMI) of 33.0 to 33.9 in adult -Body mass index is 33.79 kg/m..  -Weight loss should be encouraged -Outpatient PCP/bariatric medicine f/u encouraged   Gynecomastia -Right more than left  -Incidentally noted on  CT  -Outpatient follow-up and consideration for mammogram is recommended    Kidney lesion, native, left -Incidentally noted on CT -Outpatient MRI recommended   ESRD (end stage renal disease) on dialysis ([place]) -Patient on chronic MWF HD -Nephrology prn order set utilized -Due for HD again tomorrow post-catheter placement  Anemia -Associated with ESRD -However H&H serially dropping, no Javeon blood in stool, pending Hemoccult stool and haptoglobin, stable DIC panel, LDH , placed on PPI twice daily and monitor H&H closely.  Type screen done, no signs of brisk ongoing bleeding.  DM (diabetes mellitus), type 2 with peripheral vascular complications (HCC) -123456 5.6, good control -Continue glargine -Cover with very sensitive-scale SSI   CBG (last 3)  Recent Labs    05/30/22 2121 05/31/22 0229 05/31/22 0726  GLUCAP 204* 173* 173*      Essential hypertension -Continue amlodipine, carvedilol, clonidine, hydralazine  Hyperlipidemia -Continue rosuvastatin -Hold Vascepa      Condition - Extremely Guarded  Family Communication  : Called brother [name] on 05/31/2022 at 7:40 AM and message left [phone]   Code Status :  Full  Consults  :  ID, EP, Renal, IR  PUD Prophylaxis : PPI   Procedures  :      TTE - 1. Left ventricular ejection fraction, by estimation, is 45 to 50%. The left ventricle has mildly decreased function. The left ventricle demonstrates regional wall motion abnormalities (see scoring diagram/findings for description). There is moderate concentric left ventricular hypertrophy. Left ventricular diastolic parameters are consistent with Grade I diastolic dysfunction (impaired relaxation).  2. Right ventricular systolic function is normal. The right ventricular size is normal. There is normal pulmonary artery systolic pressure.  3. The mitral valve is degenerative. Trivial mitral valve regurgitation.  4. The aortic valve is tricuspid. There is mild calcification of the aortic valve. There is mild thickening of the aortic valve. Aortic valve regurgitation is not visualized. Mild aortic valve stenosis. Aortic valve mean gradient measures 11.0 mmHg.  5. Aortic dilatation noted. There is mild dilatation of the aortic root, measuring 41 mm.  6. The inferior vena cava is normal in size with greater than 50% respiratory variability, suggesting right atrial pressure of 3 mmHg.  7. Device lead present in RA/RV with thickening and echodensity (approximately 1cm x 2cm) noted on atrial side of tricuspid valve concerning for vegetation in the setting of known bacteremia. TEE indicated for further evaluation as lead extraction may need to be considered. Comparison(s): Prior images unable to be directly viewed.       Disposition Plan  :    Status is: Inpatient  DVT Prophylaxis  :    heparin injection 5,000 Units  Start: 05/25/22 0600   Lab Results  Component Value Date   PLT 160 05/31/2022    Diet :  Diet Order  Diet NPO time specified  Diet effective now                    Inpatient Medications  Scheduled Meds:  amLODipine  10 mg Oral QHS   aspirin  81 mg Oral QHS   brimonidine  1 drop Right Eye TID   calcitRIOL  2.25 mcg Oral Q M,W,F   calcium acetate  1,334 mg Oral TID WC   [START ON 06/01/2022] carvedilol  12.5 mg Oral Q M,W,F-2000   carvedilol  12.5 mg Oral 2 times per day on Sun Tue Thu Sat   Chlorhexidine Gluconate Cloth  6 each Topical Daily   Chlorhexidine Gluconate Cloth  6 each Topical Q0600   cinacalcet  30 mg Oral Q M,W,F   cloNIDine  0.1 mg Oral QHS   clopidogrel  75 mg Oral QHS   cyanocobalamin  1,000 mcg Intramuscular Daily   [START ON 06/03/2022] vitamin B-12  1,000 mcg Oral Daily   darbepoetin (ARANESP) injection - NON-DIALYSIS  150 mcg Subcutaneous Q [id number]   folic acid  1 mg Oral QHS   gabapentin  300 mg Oral QHS   heparin  5,000 Units Subcutaneous Q8H   hydrALAZINE  25 mg Oral 2 times per day on Sun Tue Thu Sat   [START ON 06/01/2022] hydrALAZINE  25 mg Oral Q M,W,F-2000   insulin aspart  0-6 Units Subcutaneous TID WC   insulin aspart  3 Units Subcutaneous TID WC   insulin glargine-yfgn  10 Units Subcutaneous QHS   pantoprazole  40 mg Oral BID   rosuvastatin  20 mg Oral QHS   sodium chloride flush  3 mL Intravenous Q12H   Continuous Infusions:  sodium chloride Stopped (05/30/22 1310)   albumin human 25 g (05/27/22 0930)   [START ON 06/01/2022]  ceFAZolin (ANCEF) IV     PRN Meds:.acetaminophen **OR** acetaminophen, albumin human, hydrALAZINE, ondansetron **OR** ondansetron (ZOFRAN) IV, oxyCODONE   Objective:   Vitals:   05/31/22 0113 05/31/22 0143 05/31/22 0356 05/31/22 0600  BP: (!) 162/109 139/75 116/84 (!) 101/48  Pulse: 91 99 (!) 118 92  Resp: '13 15 18   '$ Temp:  98.1 F (36.7 C) 99.9 F (37.7 C) 98.9 F (37.2 C)  TempSrc:  Oral  Oral Oral  SpO2: 100% 96% 100% 100%  Weight:      Height:        Wt Readings from Last 3 Encounters:  05/27/22 113 kg  05/16/22 117.9 kg  02/08/22 117.9 kg     Intake/Output Summary (Last 24 hours) at 05/31/2022 0740 Last data filed at 05/31/2022 0143 Gross per 24 hour  Intake 200 ml  Output 1400 ml  Net -1200 ml     Physical Exam  Awake Alert, No new F.N deficits, R.IJ HD Cath [place].AT,PERRAL Supple Neck, No JVD,   Symmetrical Chest wall movement, Good air movement bilaterally, CTAB RRR,No Gallops,Rubs or new Murmurs,  +ve B.Sounds, Abd Soft, No tenderness,   L BKA, R AKA   Labs  Recent Labs  Lab 05/24/22 1725 05/25/22 0517 05/27/22 0427 05/28/22 1151 05/29/22 0409 05/30/22 0047 05/30/22 0746 05/31/22 0417  WBC 18.5*   < > 18.0* 20.0* 17.6* 14.8*  --  21.0*  HGB 7.1*   < > 8.5* 8.8* 8.0* 7.7*  --  8.7*  HCT 20.6*   < > 25.6* 24.9* 24.0* 24.1*  --  26.3*  PLT 129*   < > 157 PLATELET CLUMPS NOTED ON SMEAR, UNABLE TO ESTIMATE  166 184 195 160  MCV 88.8   < > 92.1 87.7 93.4 93.1  --  92.6  MCH 30.6   < > 30.6 31.0 31.1 29.7  --  30.6  MCHC 34.5   < > 33.2 35.3 33.3 32.0  --  33.1  RDW 16.7*   < > 17.2* 16.8* 17.3* 16.8*  --  16.5*  LYMPHSABS 1.0  --   --  1.1 1.1 1.1  --  0.5*  MONOABS 1.0  --   --  1.2* 1.0 0.8  --  0.8  EOSABS 0.3  --   --  0.2 0.2 0.2  --  0.1  BASOSABS 0.0  --   --  0.0 0.0 0.0  --  0.0   < > = values in this interval not displayed.    Recent Labs  Lab 05/24/22 1725 05/24/22 1952 05/25/22 0517 05/26/22 0513 05/27/22 0427 05/28/22 1151 05/29/22 0414 05/30/22 0047 05/30/22 0746 05/31/22 0417  NA 131*  --  132*   < > 131* 131* 131* 128*  --  132*  K 4.0  --  4.1   < > 3.7 4.5 3.8 4.1  --  3.9  CL 92*  --  95*   < > 95* 94* 93* 90*  --  93*  CO2 22  --  21*   < > 23 21* 22 23  --  22  ANIONGAP 17*  --  16*   < > 13 16* 16* 15  --  17*  GLUCOSE 140*  --  134*   < > 150* 210* 183* 178*  --  196*  BUN 86*  --  94*   < > 69* 45* 50*  57*  --  33*  CREATININE 9.50*  --  9.91*   < > 7.92* 5.99* 7.09* 8.03*  --  5.19*  AST 47*  --  33  --   --   --   --   --   --   --   ALT 28  --  24  --   --   --   --   --   --   --   ALKPHOS 71  --  66  --   --   --   --   --   --   --   BILITOT 1.3*  --  1.3*  --   --   --   --   --   --   --   ALBUMIN 2.3*  --  2.1*  --   --   --  2.1* 2.1*  --  2.2*  DDIMER  --   --   --   --   --   --   --   --  2.26*  --   LATICACIDVEN 1.0 0.9  --   --   --   --   --   --   --   --   INR 1.3*  --   --   --   --   --   --  1.3* 1.2  --   HGBA1C  --   --  5.6  --   --   --   --   --   --   --   BNP 427.0*  --   --   --   --   --   --   --   --   --   MG 2.1  --   --   --   --   --   --   --   --   --  CALCIUM 9.1  --  8.9   < > 9.0 8.9 9.3 9.5  --  9.2   < > = values in this interval not displayed.    Radiology Reports IR US Guide Vasc Access Right  Result Date: 05/30/2022 CLINICAL DATA:  End-stage renal disease and removal of previously placed right IJ tunneled hemodialysis catheter on 05/27/2022 due to bacteremia. The patient has now been satisfactory treated for bacteremia and requires a new tunneled hemodialysis catheter for continued hemodialysis needs. EXAM: TUNNELED CENTRAL VENOUS HEMODIALYSIS CATHETER PLACEMENT WITH ULTRASOUND AND FLUOROSCOPIC GUIDANCE ANESTHESIA/SEDATION: Moderate (conscious) sedation was employed during this procedure. A total of Versed 1.0 mg and Fentanyl 50 mcg was administered intravenously by radiology nursing. Moderate Sedation Time: 27 minutes. The patient's level of consciousness and vital signs were monitored continuously by radiology nursing throughout the procedure under my direct supervision. MEDICATIONS: 2 g IV Ancef. FLUOROSCOPY: 5 minutes and 30 seconds.  130 mGy. PROCEDURE: The procedure, risks, benefits, and alternatives were explained to the patient. Questions regarding the procedure were encouraged and answered. The patient understands and consents to the  procedure. A timeout was performed prior to initiating the procedure. A time-out was performed prior to initiating the procedure. The right neck and chest were prepped with chlorhexidine in a sterile fashion, and a sterile drape was applied covering the operative field. Maximum barrier sterile technique with sterile gowns and gloves were used for the procedure. Local anesthesia was provided with 1% lidocaine. Ultrasound was used to confirm patency of the right internal jugular vein. A permanent ultrasound image was saved and recorded. After creating a small venotomy incision, a 21 gauge needle was advanced into the right internal jugular vein under direct, real-time ultrasound guidance. Ultrasound image documentation was performed. After securing guidewire access, an 8 Fr dilator was placed. A J-wire was kinked to measure appropriate catheter length. A Palindrome tunneled hemodialysis catheter measuring 19 cm from tip to cuff was chosen for placement. This was tunneled in a retrograde fashion from the chest wall to the venotomy incision. At the venotomy, serial dilatation was performed and a 15 Fr peel-away sheath was placed over a guidewire. The catheter was then placed through the sheath and the sheath removed. Final catheter positioning was confirmed and documented with a fluoroscopic spot image. The catheter was aspirated, flushed with saline, and injected with appropriate volume heparin dwells. The venotomy incision was closed with subcuticular 4-0 Vicryl. Dermabond was applied to the incision. The catheter exit site was secured with 0-Prolene retention sutures. COMPLICATIONS: None.  No pneumothorax. FINDINGS: After catheter placement, the tip lies in the right atrium. The catheter aspirates normally and is ready for immediate use. IMPRESSION: Placement of tunneled hemodialysis catheter via the right internal jugular vein. The catheter tip lies in the right atrium. The catheter is ready for immediate use.  Electronically Signed   By: Aletta Edouard M.D.   On: 05/30/2022 13:25   IR Fluoro Guide CV Line Right  Result Date: 05/30/2022 CLINICAL DATA:  End-stage renal disease and removal of previously placed right IJ tunneled hemodialysis catheter on 05/27/2022 due to bacteremia. The patient has now been satisfactory treated for bacteremia and requires a new tunneled hemodialysis catheter for continued hemodialysis needs. EXAM: TUNNELED CENTRAL VENOUS HEMODIALYSIS CATHETER PLACEMENT WITH ULTRASOUND AND FLUOROSCOPIC GUIDANCE ANESTHESIA/SEDATION: Moderate (conscious) sedation was employed during this procedure. A total of Versed 1.0 mg and Fentanyl 50 mcg was administered intravenously by radiology nursing. Moderate Sedation Time: 27 minutes. The patient's level of consciousness  and vital signs were monitored continuously by radiology nursing throughout the procedure under my direct supervision. MEDICATIONS: 2 g IV Ancef. FLUOROSCOPY: 5 minutes and 30 seconds.  130 mGy. PROCEDURE: The procedure, risks, benefits, and alternatives were explained to the patient. Questions regarding the procedure were encouraged and answered. The patient understands and consents to the procedure. A timeout was performed prior to initiating the procedure. A time-out was performed prior to initiating the procedure. The right neck and chest were prepped with chlorhexidine in a sterile fashion, and a sterile drape was applied covering the operative field. Maximum barrier sterile technique with sterile gowns and gloves were used for the procedure. Local anesthesia was provided with 1% lidocaine. Ultrasound was used to confirm patency of the right internal jugular vein. A permanent ultrasound image was saved and recorded. After creating a small venotomy incision, a 21 gauge needle was advanced into the right internal jugular vein under direct, real-time ultrasound guidance. Ultrasound image documentation was performed. After securing guidewire  access, an 8 Fr dilator was placed. A J-wire was kinked to measure appropriate catheter length. A Palindrome tunneled hemodialysis catheter measuring 19 cm from tip to cuff was chosen for placement. This was tunneled in a retrograde fashion from the chest wall to the venotomy incision. At the venotomy, serial dilatation was performed and a 15 Fr peel-away sheath was placed over a guidewire. The catheter was then placed through the sheath and the sheath removed. Final catheter positioning was confirmed and documented with a fluoroscopic spot image. The catheter was aspirated, flushed with saline, and injected with appropriate volume heparin dwells. The venotomy incision was closed with subcuticular 4-0 Vicryl. Dermabond was applied to the incision. The catheter exit site was secured with 0-Prolene retention sutures. COMPLICATIONS: None.  No pneumothorax. FINDINGS: After catheter placement, the tip lies in the right atrium. The catheter aspirates normally and is ready for immediate use. IMPRESSION: Placement of tunneled hemodialysis catheter via the right internal jugular vein. The catheter tip lies in the right atrium. The catheter is ready for immediate use. Electronically Signed   By: Aletta Edouard M.D.   On: 05/30/2022 13:25   ECHOCARDIOGRAM COMPLETE  Result Date: 05/27/2022    ECHOCARDIOGRAM REPORT   Patient Name:   Orin Kiehl. Date of Exam: 05/27/2022 Medical Rec #:  [id number]         Height:       72.0 in Accession #:    [id number]        Weight:       249.1 lb Date of Birth:  1955/12/17         BSA:          2.339 m Patient Age:    15 years          BP:           140/62 mmHg Patient Gender: M                 HR:           83 bpm. Exam Location:  [facility] Procedure: 2D Echo, Cardiac Doppler and Color Doppler Indications:    Bacteremia R78.81  History:        Patient has prior history of Echocardiogram examinations, most                 recent 06/19/2016. CHF and Cardiomyopathy, TIA; Risk  Factors:Dyslipidemia, Diabetes and Hypertension. ESRD (end stage                 renal disease) on dialysis, COVID-19 virus infection, AICD                 (automatic cardioverter/defibrillator) present (From Hx).  Sonographer:    Alvino Chapel RCS Referring Phys: [id number] [place] IMPRESSIONS  1. Left ventricular ejection fraction, by estimation, is 45 to 50%. The left ventricle has mildly decreased function. The left ventricle demonstrates regional wall motion abnormalities (see scoring diagram/findings for description). There is moderate concentric left ventricular hypertrophy. Left ventricular diastolic parameters are consistent with Grade I diastolic dysfunction (impaired relaxation).  2. Right ventricular systolic function is normal. The right ventricular size is normal. There is normal pulmonary artery systolic pressure.  3. The mitral valve is degenerative. Trivial mitral valve regurgitation.  4. The aortic valve is tricuspid. There is mild calcification of the aortic valve. There is mild thickening of the aortic valve. Aortic valve regurgitation is not visualized. Mild aortic valve stenosis. Aortic valve mean gradient measures 11.0 mmHg.  5. Aortic dilatation noted. There is mild dilatation of the aortic root, measuring 41 mm.  6. The inferior vena cava is normal in size with greater than 50% respiratory variability, suggesting right atrial pressure of 3 mmHg.  7. Device lead present in RA/RV with thickening and echodensity (approximately 1cm x 2cm) noted on atrial side of tricuspid valve concerning for vegetation in the setting of known bacteremia. TEE indicated for further evaluation as lead extraction may need to be considered. Comparison(s): Prior images unable to be directly viewed. FINDINGS  Left Ventricle: Left ventricular ejection fraction, by estimation, is 45 to 50%. The left ventricle has mildly decreased function. The left ventricle demonstrates regional wall motion abnormalities. Definity  contrast agent was given IV to delineate the left ventricular endocardial borders. The left ventricular internal cavity size was normal in size. There is moderate concentric left ventricular hypertrophy. Left ventricular diastolic parameters are consistent with Grade I diastolic dysfunction (impaired relaxation).  LV Wall Scoring: The basal inferolateral segment, basal inferior segment, and basal inferoseptal segment are hypokinetic. The entire anterior wall, antero-lateral wall, mid and distal lateral wall, entire anterior septum, entire apex, mid and distal inferior wall, and mid inferoseptal segment are normal. Right Ventricle: The right ventricular size is normal. No increase in right ventricular wall thickness. Right ventricular systolic function is normal. There is normal pulmonary artery systolic pressure. The tricuspid regurgitant velocity is 2.20 m/s, and  with an assumed right atrial pressure of 3 mmHg, the estimated right ventricular systolic pressure is [id number] mmHg. Left Atrium: Left atrial size was normal in size. Right Atrium: Right atrial size was normal in size. Pericardium: There is no evidence of pericardial effusion. Mitral Valve: The mitral valve is degenerative in appearance. There is mild thickening of the mitral valve leaflet(s). There is mild calcification of the mitral valve leaflet(s). Trivial mitral valve regurgitation. Tricuspid Valve: The tricuspid valve is grossly normal. Tricuspid valve regurgitation is mild. Aortic Valve: The aortic valve is tricuspid. There is mild calcification of the aortic valve. There is mild thickening of the aortic valve. There is mild to moderate aortic valve annular calcification. Aortic valve regurgitation is not visualized. Mild aortic stenosis is present. Aortic valve mean gradient measures 11.0 mmHg. Aortic valve peak gradient measures 22.2 mmHg. Aortic valve area, by VTI measures 1.67 cm. Pulmonic Valve: The pulmonic valve was grossly normal. Pulmonic  valve regurgitation  is trivial. Aorta: Aortic dilatation noted. There is mild dilatation of the aortic root, measuring 41 mm. Venous: The inferior vena cava is normal in size with greater than 50% respiratory variability, suggesting right atrial pressure of 3 mmHg. IAS/Shunts: No atrial level shunt detected by color flow Doppler.  LEFT VENTRICLE PLAX 2D LVIDd:         5.20 cm   Diastology LVIDs:         3.30 cm   LV e' medial:    3.26 cm/s LV PW:         1.40 cm   LV E/e' medial:  27.2 LV IVS:        1.30 cm   LV e' lateral:   9.68 cm/s LVOT diam:     2.20 cm   LV E/e' lateral: 9.2 LV SV:         76 LV SV Index:   33 LVOT Area:     3.80 cm  RIGHT VENTRICLE RV S prime:     15.10 cm/s TAPSE (M-mode): 2.0 cm LEFT ATRIUM             Index        RIGHT ATRIUM           Index LA diam:        4.00 cm 1.71 cm/m   RA Area:     20.60 cm LA Vol (A2C):   64.4 ml 27.53 ml/m  RA Volume:   67.70 ml  28.94 ml/m LA Vol (A4C):   67.5 ml 28.86 ml/m LA Biplane Vol: 71.7 ml 30.65 ml/m  AORTIC VALVE AV Area (Vmax):    1.57 cm AV Area (Vmean):   1.60 cm AV Area (VTI):     1.67 cm AV Vmax:           235.33 cm/s AV Vmean:          154.667 cm/s AV VTI:            0.454 m AV Peak Grad:      22.2 mmHg AV Mean Grad:      11.0 mmHg LVOT Vmax:         97.50 cm/s LVOT Vmean:        65.000 cm/s LVOT VTI:          0.200 m LVOT/AV VTI ratio: 0.44  AORTA Ao Root diam: 4.10 cm MITRAL VALVE                TRICUSPID VALVE MV Area (PHT): 4.80 cm     TR Peak grad:   19.4 mmHg MV Decel Time: 158 msec     TR Vmax:        220.00 cm/s MV E velocity: 88.60 cm/s MV A velocity: 115.50 cm/s  SHUNTS MV E/A ratio:  0.77         Systemic VTI:  0.20 m                             Systemic Diam: 2.20 cm Rozann Lesches MD Electronically signed by Rozann Lesches MD Signature Date/Time: 05/27/2022/5:46:15 PM    Final       Signature  -   Lala Lund M.D on 05/31/2022 at 7:40 AM   -  To page go to [email or web address]

## 2022-05-31 NOTE — CV Procedure (Signed)
     TRANSESOPHAGEAL ECHOCARDIOGRAM   NAME:  [name].   MRN: [id number] DOB:  [birth year]   ADMIT DATE: 05/24/2022  INDICATIONS: Bacteremia  PROCEDURE:   Informed consent was obtained prior to the procedure. The risks, benefits and alternatives for the procedure were discussed and the patient comprehended these risks.  Risks include, but are not limited to, cough, sore throat, vomiting, nausea, somnolence, esophageal and stomach trauma or perforation, bleeding, low blood pressure, aspiration, pneumonia, infection, trauma to the teeth and death.    After a procedural time-out, the oropharynx was anesthetized and the patient was sedated by the anesthesia service. The transesophageal probe was inserted in the esophagus and stomach without difficulty and multiple views were obtained. Anesthesia was monitored by [facility], CRNA.    COMPLICATIONS:    There were no immediate complications.  FINDINGS:  Large vegetation on tricuspid valve/ICD lead.  There is a second smaller vegetation on ICD in within right atrium.  Mild TR.  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  Moderate to severe AI.    Oswaldo Milian MD [facility]  [address], [place], [place] [address] [phone]   1:02 PM

## 2022-05-31 NOTE — H&P (View-Only) (Signed)
PROGRESS NOTE                                                                                                                                                                                                             Patient Demographics:    [name], is a 67 y.o. male, DOB - [birth year], [id number]  Outpatient Primary MD for the patient is [name], MD    LOS - 6  Admit date - 05/24/2022    Chief Complaint  Patient presents with   Shortness of Breath       Brief Narrative (HPI from H&P)   9 yom w/ hypertension,IDDM,PAD,anemia, pacemaker, and ESRD on HD, sent from dialysis for evaluation of bacteremia.   Patient severely fatigued for the past 1 week, missed dialysis 2/20 for this reason, and was contacted by dialysis personnel, informed that he had an infection in his blood, and was directed to the ED. No fever, chills, cough, dysuria, abdominal pain, vomiting, diarrhea, neck stiffness, or new rash. In EH:[id number] and saturating mid 90s on room air. CT C/A/P notable for mild skin thickening and edema of the abdominal wall without gas or abscess, left renal lesion, right greater than left gynecomastia, and other nonacute findings.    He was admitted to the hospital for bacteremia in the presence of dialysis tunneled catheter, AICD, seen by nephrology, ID and EP.  Currently on IV antibiotics awaiting further workup.  He was transferred under my care on 05/30/2022 on day 5 of hospital stay.   Subjective:   Patient in bed, appears comfortable, denies any headache, no fever, no chest pain or pressure, no shortness of breath , no abdominal pain. No focal weakness.   Assessment  & Plan :     Bacteremia with HD center blood cx growing [place] with history of HD catheter and AICD device-  Sent by HD to the ED due to bacteremia from blood cultures on 2/15, underwent echocardiogram showing - Device lead present in RA/RV  with thickening and echodensity (approximately 1cm x 2cm) noted on atrial side of tricuspid valve concerning for vegetation in the setting of known bacteremia.   ID monitoring remotely, renal and EP following have requested ID to do a formal consult on 05/30/2022.  Underwent tunneled catheter removal by IR on 05/27/2022, new tunneled right IJ dialysis catheter  was placed on 05/30/2022 after line holidays.  On IV Ancef, repeat cultures thus far negative appears nontoxic, unfortunately patient refused AICD removal which could be potentially infected and hence TEE was canceled by cardiology, continue medical treatment.  Patient strictly counseled that this is not the ideal treatment and can lead to worsening of infection, sepsis and death.  He understands this and assumes all responsibility.  Class 1 obesity due to excess calories with body mass index (BMI) of 33.0 to 33.9 in adult -Body mass index is 33.79 kg/m..  -Weight loss should be encouraged -Outpatient PCP/bariatric medicine f/u encouraged   Gynecomastia -Right more than left  -Incidentally noted on  CT  -Outpatient follow-up and consideration for mammogram is recommended    Kidney lesion, native, left -Incidentally noted on CT -Outpatient MRI recommended   ESRD (end stage renal disease) on dialysis ([place]) -Patient on chronic MWF HD -Nephrology prn order set utilized -Due for HD again tomorrow post-catheter placement  Anemia -Associated with ESRD -However H&H serially dropping, no Hyrum blood in stool, pending Hemoccult stool and haptoglobin, stable DIC panel, LDH , placed on PPI twice daily and monitor H&H closely.  Type screen done, no signs of brisk ongoing bleeding.  DM (diabetes mellitus), type 2 with peripheral vascular complications (HCC) -123456 5.6, good control -Continue glargine -Cover with very sensitive-scale SSI   CBG (last 3)  Recent Labs    05/30/22 2121 05/31/22 0229 05/31/22 0726  GLUCAP 204* 173* 173*      Essential hypertension -Continue amlodipine, carvedilol, clonidine, hydralazine  Hyperlipidemia -Continue rosuvastatin -Hold Vascepa      Condition - Extremely Guarded  Family Communication  : Called brother [name] on 05/31/2022 at 7:40 AM and message left [phone]   Code Status :  Full  Consults  :  ID, EP, Renal, IR  PUD Prophylaxis : PPI   Procedures  :      TTE - 1. Left ventricular ejection fraction, by estimation, is 45 to 50%. The left ventricle has mildly decreased function. The left ventricle demonstrates regional wall motion abnormalities (see scoring diagram/findings for description). There is moderate concentric left ventricular hypertrophy. Left ventricular diastolic parameters are consistent with Grade I diastolic dysfunction (impaired relaxation).  2. Right ventricular systolic function is normal. The right ventricular size is normal. There is normal pulmonary artery systolic pressure.  3. The mitral valve is degenerative. Trivial mitral valve regurgitation.  4. The aortic valve is tricuspid. There is mild calcification of the aortic valve. There is mild thickening of the aortic valve. Aortic valve regurgitation is not visualized. Mild aortic valve stenosis. Aortic valve mean gradient measures 11.0 mmHg.  5. Aortic dilatation noted. There is mild dilatation of the aortic root, measuring 41 mm.  6. The inferior vena cava is normal in size with greater than 50% respiratory variability, suggesting right atrial pressure of 3 mmHg.  7. Device lead present in RA/RV with thickening and echodensity (approximately 1cm x 2cm) noted on atrial side of tricuspid valve concerning for vegetation in the setting of known bacteremia. TEE indicated for further evaluation as lead extraction may need to be considered. Comparison(s): Prior images unable to be directly viewed.       Disposition Plan  :    Status is: Inpatient  DVT Prophylaxis  :    heparin injection 5,000 Units  Start: 05/25/22 0600   Lab Results  Component Value Date   PLT 160 05/31/2022    Diet :  Diet Order  Diet NPO time specified  Diet effective now                    Inpatient Medications  Scheduled Meds:  amLODipine  10 mg Oral QHS   aspirin  81 mg Oral QHS   brimonidine  1 drop Right Eye TID   calcitRIOL  2.25 mcg Oral Q M,W,F   calcium acetate  1,334 mg Oral TID WC   [START ON 06/01/2022] carvedilol  12.5 mg Oral Q M,W,F-2000   carvedilol  12.5 mg Oral 2 times per day on Sun Tue Thu Sat   Chlorhexidine Gluconate Cloth  6 each Topical Daily   Chlorhexidine Gluconate Cloth  6 each Topical Q0600   cinacalcet  30 mg Oral Q M,W,F   cloNIDine  0.1 mg Oral QHS   clopidogrel  75 mg Oral QHS   cyanocobalamin  1,000 mcg Intramuscular Daily   [START ON 06/03/2022] vitamin B-12  1,000 mcg Oral Daily   darbepoetin (ARANESP) injection - NON-DIALYSIS  150 mcg Subcutaneous Q [id number]   folic acid  1 mg Oral QHS   gabapentin  300 mg Oral QHS   heparin  5,000 Units Subcutaneous Q8H   hydrALAZINE  25 mg Oral 2 times per day on Sun Tue Thu Sat   [START ON 06/01/2022] hydrALAZINE  25 mg Oral Q M,W,F-2000   insulin aspart  0-6 Units Subcutaneous TID WC   insulin aspart  3 Units Subcutaneous TID WC   insulin glargine-yfgn  10 Units Subcutaneous QHS   pantoprazole  40 mg Oral BID   rosuvastatin  20 mg Oral QHS   sodium chloride flush  3 mL Intravenous Q12H   Continuous Infusions:  sodium chloride Stopped (05/30/22 1310)   albumin human 25 g (05/27/22 0930)   [START ON 06/01/2022]  ceFAZolin (ANCEF) IV     PRN Meds:.acetaminophen **OR** acetaminophen, albumin human, hydrALAZINE, ondansetron **OR** ondansetron (ZOFRAN) IV, oxyCODONE   Objective:   Vitals:   05/31/22 0113 05/31/22 0143 05/31/22 0356 05/31/22 0600  BP: (!) 162/109 139/75 116/84 (!) 101/48  Pulse: 91 99 (!) 118 92  Resp: '13 15 18   '$ Temp:  98.1 F (36.7 C) 99.9 F (37.7 C) 98.9 F (37.2 C)  TempSrc:  Oral  Oral Oral  SpO2: 100% 96% 100% 100%  Weight:      Height:        Wt Readings from Last 3 Encounters:  05/27/22 113 kg  05/16/22 117.9 kg  02/08/22 117.9 kg     Intake/Output Summary (Last 24 hours) at 05/31/2022 0740 Last data filed at 05/31/2022 0143 Gross per 24 hour  Intake 200 ml  Output 1400 ml  Net -1200 ml     Physical Exam  Awake Alert, No new F.N deficits, R.IJ HD Cath [place].AT,PERRAL Supple Neck, No JVD,   Symmetrical Chest wall movement, Good air movement bilaterally, CTAB RRR,No Gallops,Rubs or new Murmurs,  +ve B.Sounds, Abd Soft, No tenderness,   L BKA, R AKA   Labs  Recent Labs  Lab 05/24/22 1725 05/25/22 0517 05/27/22 0427 05/28/22 1151 05/29/22 0409 05/30/22 0047 05/30/22 0746 05/31/22 0417  WBC 18.5*   < > 18.0* 20.0* 17.6* 14.8*  --  21.0*  HGB 7.1*   < > 8.5* 8.8* 8.0* 7.7*  --  8.7*  HCT 20.6*   < > 25.6* 24.9* 24.0* 24.1*  --  26.3*  PLT 129*   < > 157 PLATELET CLUMPS NOTED ON SMEAR, UNABLE TO ESTIMATE  166 184 195 160  MCV 88.8   < > 92.1 87.7 93.4 93.1  --  92.6  MCH 30.6   < > 30.6 31.0 31.1 29.7  --  30.6  MCHC 34.5   < > 33.2 35.3 33.3 32.0  --  33.1  RDW 16.7*   < > 17.2* 16.8* 17.3* 16.8*  --  16.5*  LYMPHSABS 1.0  --   --  1.1 1.1 1.1  --  0.5*  MONOABS 1.0  --   --  1.2* 1.0 0.8  --  0.8  EOSABS 0.3  --   --  0.2 0.2 0.2  --  0.1  BASOSABS 0.0  --   --  0.0 0.0 0.0  --  0.0   < > = values in this interval not displayed.    Recent Labs  Lab 05/24/22 1725 05/24/22 1952 05/25/22 0517 05/26/22 0513 05/27/22 0427 05/28/22 1151 05/29/22 0414 05/30/22 0047 05/30/22 0746 05/31/22 0417  NA 131*  --  132*   < > 131* 131* 131* 128*  --  132*  K 4.0  --  4.1   < > 3.7 4.5 3.8 4.1  --  3.9  CL 92*  --  95*   < > 95* 94* 93* 90*  --  93*  CO2 22  --  21*   < > 23 21* 22 23  --  22  ANIONGAP 17*  --  16*   < > 13 16* 16* 15  --  17*  GLUCOSE 140*  --  134*   < > 150* 210* 183* 178*  --  196*  BUN 86*  --  94*   < > 69* 45* 50*  57*  --  33*  CREATININE 9.50*  --  9.91*   < > 7.92* 5.99* 7.09* 8.03*  --  5.19*  AST 47*  --  33  --   --   --   --   --   --   --   ALT 28  --  24  --   --   --   --   --   --   --   ALKPHOS 71  --  66  --   --   --   --   --   --   --   BILITOT 1.3*  --  1.3*  --   --   --   --   --   --   --   ALBUMIN 2.3*  --  2.1*  --   --   --  2.1* 2.1*  --  2.2*  DDIMER  --   --   --   --   --   --   --   --  2.26*  --   LATICACIDVEN 1.0 0.9  --   --   --   --   --   --   --   --   INR 1.3*  --   --   --   --   --   --  1.3* 1.2  --   HGBA1C  --   --  5.6  --   --   --   --   --   --   --   BNP 427.0*  --   --   --   --   --   --   --   --   --   MG 2.1  --   --   --   --   --   --   --   --   --  CALCIUM 9.1  --  8.9   < > 9.0 8.9 9.3 9.5  --  9.2   < > = values in this interval not displayed.    Radiology Reports IR US Guide Vasc Access Right  Result Date: 05/30/2022 CLINICAL DATA:  End-stage renal disease and removal of previously placed right IJ tunneled hemodialysis catheter on 05/27/2022 due to bacteremia. The patient has now been satisfactory treated for bacteremia and requires a new tunneled hemodialysis catheter for continued hemodialysis needs. EXAM: TUNNELED CENTRAL VENOUS HEMODIALYSIS CATHETER PLACEMENT WITH ULTRASOUND AND FLUOROSCOPIC GUIDANCE ANESTHESIA/SEDATION: Moderate (conscious) sedation was employed during this procedure. A total of Versed 1.0 mg and Fentanyl 50 mcg was administered intravenously by radiology nursing. Moderate Sedation Time: 27 minutes. The patient's level of consciousness and vital signs were monitored continuously by radiology nursing throughout the procedure under my direct supervision. MEDICATIONS: 2 g IV Ancef. FLUOROSCOPY: 5 minutes and 30 seconds.  130 mGy. PROCEDURE: The procedure, risks, benefits, and alternatives were explained to the patient. Questions regarding the procedure were encouraged and answered. The patient understands and consents to the  procedure. A timeout was performed prior to initiating the procedure. A time-out was performed prior to initiating the procedure. The right neck and chest were prepped with chlorhexidine in a sterile fashion, and a sterile drape was applied covering the operative field. Maximum barrier sterile technique with sterile gowns and gloves were used for the procedure. Local anesthesia was provided with 1% lidocaine. Ultrasound was used to confirm patency of the right internal jugular vein. A permanent ultrasound image was saved and recorded. After creating a small venotomy incision, a 21 gauge needle was advanced into the right internal jugular vein under direct, real-time ultrasound guidance. Ultrasound image documentation was performed. After securing guidewire access, an 8 Fr dilator was placed. A J-wire was kinked to measure appropriate catheter length. A Palindrome tunneled hemodialysis catheter measuring 19 cm from tip to cuff was chosen for placement. This was tunneled in a retrograde fashion from the chest wall to the venotomy incision. At the venotomy, serial dilatation was performed and a 15 Fr peel-away sheath was placed over a guidewire. The catheter was then placed through the sheath and the sheath removed. Final catheter positioning was confirmed and documented with a fluoroscopic spot image. The catheter was aspirated, flushed with saline, and injected with appropriate volume heparin dwells. The venotomy incision was closed with subcuticular 4-0 Vicryl. Dermabond was applied to the incision. The catheter exit site was secured with 0-Prolene retention sutures. COMPLICATIONS: None.  No pneumothorax. FINDINGS: After catheter placement, the tip lies in the right atrium. The catheter aspirates normally and is ready for immediate use. IMPRESSION: Placement of tunneled hemodialysis catheter via the right internal jugular vein. The catheter tip lies in the right atrium. The catheter is ready for immediate use.  Electronically Signed   By: Aletta Edouard M.D.   On: 05/30/2022 13:25   IR Fluoro Guide CV Line Right  Result Date: 05/30/2022 CLINICAL DATA:  End-stage renal disease and removal of previously placed right IJ tunneled hemodialysis catheter on 05/27/2022 due to bacteremia. The patient has now been satisfactory treated for bacteremia and requires a new tunneled hemodialysis catheter for continued hemodialysis needs. EXAM: TUNNELED CENTRAL VENOUS HEMODIALYSIS CATHETER PLACEMENT WITH ULTRASOUND AND FLUOROSCOPIC GUIDANCE ANESTHESIA/SEDATION: Moderate (conscious) sedation was employed during this procedure. A total of Versed 1.0 mg and Fentanyl 50 mcg was administered intravenously by radiology nursing. Moderate Sedation Time: 27 minutes. The patient's level of consciousness  and vital signs were monitored continuously by radiology nursing throughout the procedure under my direct supervision. MEDICATIONS: 2 g IV Ancef. FLUOROSCOPY: 5 minutes and 30 seconds.  130 mGy. PROCEDURE: The procedure, risks, benefits, and alternatives were explained to the patient. Questions regarding the procedure were encouraged and answered. The patient understands and consents to the procedure. A timeout was performed prior to initiating the procedure. A time-out was performed prior to initiating the procedure. The right neck and chest were prepped with chlorhexidine in a sterile fashion, and a sterile drape was applied covering the operative field. Maximum barrier sterile technique with sterile gowns and gloves were used for the procedure. Local anesthesia was provided with 1% lidocaine. Ultrasound was used to confirm patency of the right internal jugular vein. A permanent ultrasound image was saved and recorded. After creating a small venotomy incision, a 21 gauge needle was advanced into the right internal jugular vein under direct, real-time ultrasound guidance. Ultrasound image documentation was performed. After securing guidewire  access, an 8 Fr dilator was placed. A J-wire was kinked to measure appropriate catheter length. A Palindrome tunneled hemodialysis catheter measuring 19 cm from tip to cuff was chosen for placement. This was tunneled in a retrograde fashion from the chest wall to the venotomy incision. At the venotomy, serial dilatation was performed and a 15 Fr peel-away sheath was placed over a guidewire. The catheter was then placed through the sheath and the sheath removed. Final catheter positioning was confirmed and documented with a fluoroscopic spot image. The catheter was aspirated, flushed with saline, and injected with appropriate volume heparin dwells. The venotomy incision was closed with subcuticular 4-0 Vicryl. Dermabond was applied to the incision. The catheter exit site was secured with 0-Prolene retention sutures. COMPLICATIONS: None.  No pneumothorax. FINDINGS: After catheter placement, the tip lies in the right atrium. The catheter aspirates normally and is ready for immediate use. IMPRESSION: Placement of tunneled hemodialysis catheter via the right internal jugular vein. The catheter tip lies in the right atrium. The catheter is ready for immediate use. Electronically Signed   By: Aletta Edouard M.D.   On: 05/30/2022 13:25   ECHOCARDIOGRAM COMPLETE  Result Date: 05/27/2022    ECHOCARDIOGRAM REPORT   Patient Name:   [name]. Date of Exam: 05/27/2022 Medical Rec #:  [id number]         Height:       72.0 in Accession #:    [id number]        Weight:       249.1 lb Date of Birth:  December 25, 1955         BSA:          2.339 m Patient Age:    37 years          BP:           140/62 mmHg Patient Gender: M                 HR:           83 bpm. Exam Location:  [facility] Procedure: 2D Echo, Cardiac Doppler and Color Doppler Indications:    Bacteremia R78.81  History:        Patient has prior history of Echocardiogram examinations, most                 recent 06/19/2016. CHF and Cardiomyopathy, TIA; Risk  Factors:Dyslipidemia, Diabetes and Hypertension. ESRD (end stage                 renal disease) on dialysis, COVID-19 virus infection, AICD                 (automatic cardioverter/defibrillator) present (From Hx).  Sonographer:    Alvino Chapel RCS Referring Phys: [id number] [place] IMPRESSIONS  1. Left ventricular ejection fraction, by estimation, is 45 to 50%. The left ventricle has mildly decreased function. The left ventricle demonstrates regional wall motion abnormalities (see scoring diagram/findings for description). There is moderate concentric left ventricular hypertrophy. Left ventricular diastolic parameters are consistent with Grade I diastolic dysfunction (impaired relaxation).  2. Right ventricular systolic function is normal. The right ventricular size is normal. There is normal pulmonary artery systolic pressure.  3. The mitral valve is degenerative. Trivial mitral valve regurgitation.  4. The aortic valve is tricuspid. There is mild calcification of the aortic valve. There is mild thickening of the aortic valve. Aortic valve regurgitation is not visualized. Mild aortic valve stenosis. Aortic valve mean gradient measures 11.0 mmHg.  5. Aortic dilatation noted. There is mild dilatation of the aortic root, measuring 41 mm.  6. The inferior vena cava is normal in size with greater than 50% respiratory variability, suggesting right atrial pressure of 3 mmHg.  7. Device lead present in RA/RV with thickening and echodensity (approximately 1cm x 2cm) noted on atrial side of tricuspid valve concerning for vegetation in the setting of known bacteremia. TEE indicated for further evaluation as lead extraction may need to be considered. Comparison(s): Prior images unable to be directly viewed. FINDINGS  Left Ventricle: Left ventricular ejection fraction, by estimation, is 45 to 50%. The left ventricle has mildly decreased function. The left ventricle demonstrates regional wall motion abnormalities. Definity  contrast agent was given IV to delineate the left ventricular endocardial borders. The left ventricular internal cavity size was normal in size. There is moderate concentric left ventricular hypertrophy. Left ventricular diastolic parameters are consistent with Grade I diastolic dysfunction (impaired relaxation).  LV Wall Scoring: The basal inferolateral segment, basal inferior segment, and basal inferoseptal segment are hypokinetic. The entire anterior wall, antero-lateral wall, mid and distal lateral wall, entire anterior septum, entire apex, mid and distal inferior wall, and mid inferoseptal segment are normal. Right Ventricle: The right ventricular size is normal. No increase in right ventricular wall thickness. Right ventricular systolic function is normal. There is normal pulmonary artery systolic pressure. The tricuspid regurgitant velocity is 2.20 m/s, and  with an assumed right atrial pressure of 3 mmHg, the estimated right ventricular systolic pressure is [id number] mmHg. Left Atrium: Left atrial size was normal in size. Right Atrium: Right atrial size was normal in size. Pericardium: There is no evidence of pericardial effusion. Mitral Valve: The mitral valve is degenerative in appearance. There is mild thickening of the mitral valve leaflet(s). There is mild calcification of the mitral valve leaflet(s). Trivial mitral valve regurgitation. Tricuspid Valve: The tricuspid valve is grossly normal. Tricuspid valve regurgitation is mild. Aortic Valve: The aortic valve is tricuspid. There is mild calcification of the aortic valve. There is mild thickening of the aortic valve. There is mild to moderate aortic valve annular calcification. Aortic valve regurgitation is not visualized. Mild aortic stenosis is present. Aortic valve mean gradient measures 11.0 mmHg. Aortic valve peak gradient measures 22.2 mmHg. Aortic valve area, by VTI measures 1.67 cm. Pulmonic Valve: The pulmonic valve was grossly normal. Pulmonic  valve regurgitation  is trivial. Aorta: Aortic dilatation noted. There is mild dilatation of the aortic root, measuring 41 mm. Venous: The inferior vena cava is normal in size with greater than 50% respiratory variability, suggesting right atrial pressure of 3 mmHg. IAS/Shunts: No atrial level shunt detected by color flow Doppler.  LEFT VENTRICLE PLAX 2D LVIDd:         5.20 cm   Diastology LVIDs:         3.30 cm   LV e' medial:    3.26 cm/s LV PW:         1.40 cm   LV E/e' medial:  27.2 LV IVS:        1.30 cm   LV e' lateral:   9.68 cm/s LVOT diam:     2.20 cm   LV E/e' lateral: 9.2 LV SV:         76 LV SV Index:   33 LVOT Area:     3.80 cm  RIGHT VENTRICLE RV S prime:     15.10 cm/s TAPSE (M-mode): 2.0 cm LEFT ATRIUM             Index        RIGHT ATRIUM           Index LA diam:        4.00 cm 1.71 cm/m   RA Area:     20.60 cm LA Vol (A2C):   64.4 ml 27.53 ml/m  RA Volume:   67.70 ml  28.94 ml/m LA Vol (A4C):   67.5 ml 28.86 ml/m LA Biplane Vol: 71.7 ml 30.65 ml/m  AORTIC VALVE AV Area (Vmax):    1.57 cm AV Area (Vmean):   1.60 cm AV Area (VTI):     1.67 cm AV Vmax:           235.33 cm/s AV Vmean:          154.667 cm/s AV VTI:            0.454 m AV Peak Grad:      22.2 mmHg AV Mean Grad:      11.0 mmHg LVOT Vmax:         97.50 cm/s LVOT Vmean:        65.000 cm/s LVOT VTI:          0.200 m LVOT/AV VTI ratio: 0.44  AORTA Ao Root diam: 4.10 cm MITRAL VALVE                TRICUSPID VALVE MV Area (PHT): 4.80 cm     TR Peak grad:   19.4 mmHg MV Decel Time: 158 msec     TR Vmax:        220.00 cm/s MV E velocity: 88.60 cm/s MV A velocity: 115.50 cm/s  SHUNTS MV E/A ratio:  0.77         Systemic VTI:  0.20 m                             Systemic Diam: 2.20 cm Rozann Lesches MD Electronically signed by Rozann Lesches MD Signature Date/Time: 05/27/2022/5:46:15 PM    Final       Signature  -   Lala Lund M.D on 05/31/2022 at 7:40 AM   -  To page go to [email or web address]

## 2022-05-31 NOTE — Progress Notes (Signed)
  Echocardiogram Echocardiogram Transesophageal has been performed.  [name] 05/31/2022, 11:46 AM

## 2022-06-01 ENCOUNTER — Encounter (HOSPITAL_COMMUNITY): Payer: Self-pay | Admitting: Cardiology

## 2022-06-01 DIAGNOSIS — R7881 Bacteremia: Secondary | ICD-10-CM | POA: Diagnosis not present

## 2022-06-01 LAB — CBC WITH DIFFERENTIAL/PLATELET
Abs Immature Granulocytes: 0.1 10*3/uL — ABNORMAL HIGH (ref 0.00–0.07)
Basophils Absolute: 0 10*3/uL (ref 0.0–0.1)
Basophils Relative: 0 %
Eosinophils Absolute: 0.3 10*3/uL (ref 0.0–0.5)
Eosinophils Relative: 2 %
HCT: 23.3 % — ABNORMAL LOW (ref 39.0–52.0)
Hemoglobin: 7.6 g/dL — ABNORMAL LOW (ref 13.0–17.0)
Immature Granulocytes: 1 %
Lymphocytes Relative: 8 %
Lymphs Abs: 1 10*3/uL (ref 0.7–4.0)
MCH: 30.5 pg (ref 26.0–34.0)
MCHC: 32.6 g/dL (ref 30.0–36.0)
MCV: 93.6 fL (ref 80.0–100.0)
Monocytes Absolute: 0.9 10*3/uL (ref 0.1–1.0)
Monocytes Relative: 7 %
Neutro Abs: 11 10*3/uL — ABNORMAL HIGH (ref 1.7–7.7)
Neutrophils Relative %: 82 %
Platelets: 155 10*3/uL (ref 150–400)
RBC: 2.49 MIL/uL — ABNORMAL LOW (ref 4.22–5.81)
RDW: 16.5 % — ABNORMAL HIGH (ref 11.5–15.5)
WBC: 13.3 10*3/uL — ABNORMAL HIGH (ref 4.0–10.5)
nRBC: 0 % (ref 0.0–0.2)

## 2022-06-01 LAB — RENAL FUNCTION PANEL
Albumin: 2.1 g/dL — ABNORMAL LOW (ref 3.5–5.0)
Anion gap: 13 (ref 5–15)
BUN: 42 mg/dL — ABNORMAL HIGH (ref 8–23)
CO2: 25 mmol/L (ref 22–32)
Calcium: 9.2 mg/dL (ref 8.9–10.3)
Chloride: 94 mmol/L — ABNORMAL LOW (ref 98–111)
Creatinine, Ser: 6.52 mg/dL — ABNORMAL HIGH (ref 0.61–1.24)
GFR, Estimated: 9 mL/min — ABNORMAL LOW (ref >60)
Glucose, Bld: 149 mg/dL — ABNORMAL HIGH (ref 70–99)
Phosphorus: 4.7 mg/dL — ABNORMAL HIGH (ref 2.5–4.6)
Potassium: 4.2 mmol/L (ref 3.5–5.1)
Sodium: 132 mmol/L — ABNORMAL LOW (ref 135–145)

## 2022-06-01 LAB — GLUCOSE, CAPILLARY
Glucose-Capillary: 118 mg/dL — ABNORMAL HIGH (ref 70–99)
Glucose-Capillary: 127 mg/dL — ABNORMAL HIGH (ref 70–99)
Glucose-Capillary: 166 mg/dL — ABNORMAL HIGH (ref 70–99)
Glucose-Capillary: 129 mg/dL — ABNORMAL HIGH (ref 70–99)

## 2022-06-01 MED ORDER — HEPARIN SODIUM (PORCINE) 1000 UNIT/ML DIALYSIS: 1000.0000 [IU] | INTRAMUSCULAR | Status: DC | PRN

## 2022-06-01 MED ORDER — HEPARIN SODIUM (PORCINE) 1000 UNIT/ML DIALYSIS
20.0000 [IU]/kg | INTRAMUSCULAR | Status: DC | PRN
  Administered 2022-06-01: 2300 [IU] via INTRAVENOUS_CENTRAL
  Filled 2022-06-01 (×2): qty 3

## 2022-06-01 MED ORDER — ANTICOAGULANT SODIUM CITRATE 4% (200MG/5ML) IV SOLN: 5.0000 mL | Status: DC | PRN

## 2022-06-01 MED ORDER — ALTEPLASE 2 MG IJ SOLR: 2.0000 mg | Freq: Once | INTRAMUSCULAR | Status: DC | PRN

## 2022-06-01 MED ORDER — SODIUM CHLORIDE 0.9% FLUSH
10.0000 mL | Freq: Two times a day (BID) | INTRAVENOUS | Status: DC
  Administered 2022-06-02 – 2022-06-07 (×3): 10 mL

## 2022-06-01 NOTE — Progress Notes (Signed)
OT Cancellation Note  Patient Details Name: [name]. MRN: [id number] DOB: 02-06-1956   Cancelled Treatment:    Reason Eval/Treat Not Completed: Patient at procedure or test/ unavailable (HD)  [name] 06/01/2022, 9:25 AM Cleta Alberts, OTR/L Acute Rehabilitation Services Office: [phone]

## 2022-06-01 NOTE — Progress Notes (Signed)
Pt receives out-pt HD at [facility] on MWF 10:30 chair time. Will assist as needed.   Melven Sartorius Renal Navigator [phone]

## 2022-06-01 NOTE — Progress Notes (Signed)
[place] for Infectious Disease  Date of Admission:  05/24/2022     Principal Problem:   Bacteremia Active Problems:   Hyperlipidemia   Essential hypertension   DM (diabetes mellitus), type 2 with peripheral vascular complications (HCC)   Anemia   ESRD (end stage renal disease) on dialysis (HCC)   Kidney lesion, native, left   Gynecomastia   Class 1 obesity due to excess calories with body mass index (BMI) of 33.0 to 33.9 in adult          Assessment: 67 YM transferred from [facility] for EP eval. Admitted with staph lugdenensis bacteremia,  #Staph Lugdenensis  bacteremia #ESRD on HD via tunneled cath #ICD -EP and nephrology on board -Blood Cx form 2/20 and 2/21 NG Recommendations: -Continue cefazolin -Right chest HD cath removed by IR on 2/23 -TEE 2/27, showed thickening of aortic root concerning for aortic root abscess, perforation left coronary cusp aortic valve leaflet, lrge vegetation measuring 1.5cm x1.5cm on  both ICD lead and septal leaflet, 1.3cm x 0.4cm right ICD lead vegetation.  CTS plan to see pt, Dr. Tenny Craw noted unlikely surgical candidate, recc medical management. Plan to see pt for formal reccs. EP awaiting CTS reccs.   Microbiology:   Antibiotics: Cefazolin 2/21- Cultures: Blood 2/20ng 2/21 ng   SUBJECTIVE: Resting in bed Interval:  Afebrile overnight, wbc 13k.  Review of Systems: Review of Systems  All other systems reviewed and are negative.    Scheduled Meds:  amLODipine  10 mg Oral QHS   aspirin  81 mg Oral QHS   brimonidine  1 drop Right Eye TID   calcitRIOL  2.25 mcg Oral Q M,W,F   calcium acetate  1,334 mg Oral TID WC   carvedilol  12.5 mg Oral Q M,W,F-2000   carvedilol  12.5 mg Oral 2 times per day on Sun Tue Thu Sat   Chlorhexidine Gluconate Cloth  6 each Topical Daily   Chlorhexidine Gluconate Cloth  6 each Topical Q0600   Chlorhexidine Gluconate Cloth  6 each Topical Q0600   cinacalcet  30 mg Oral Q M,W,F    cloNIDine  0.1 mg Oral QHS   clopidogrel  75 mg Oral QHS   cyanocobalamin  1,000 mcg Intramuscular Daily   [START ON 06/03/2022] vitamin B-12  1,000 mcg Oral Daily   darbepoetin (ARANESP) injection - NON-DIALYSIS  150 mcg Subcutaneous Q [id number]   folic acid  1 mg Oral QHS   gabapentin  300 mg Oral QHS   heparin  5,000 Units Subcutaneous Q8H   hydrALAZINE  25 mg Oral 2 times per day on Sun Tue Thu Sat   hydrALAZINE  25 mg Oral Q M,W,F-2000   insulin aspart  0-6 Units Subcutaneous TID WC   insulin aspart  3 Units Subcutaneous TID WC   insulin glargine-yfgn  10 Units Subcutaneous QHS   pantoprazole  40 mg Oral BID   rosuvastatin  20 mg Oral QHS   sodium chloride flush  10-40 mL Intracatheter Q12H   sodium chloride flush  3 mL Intravenous Q12H   Continuous Infusions:  albumin human 25 g (05/27/22 0930)    ceFAZolin (ANCEF) IV     PRN Meds:.acetaminophen **OR** acetaminophen, albumin human, hydrALAZINE, ondansetron **OR** ondansetron (ZOFRAN) IV, oxyCODONE Allergies  Allergen Reactions   Contrast Media [Iodinated Contrast Media] Hives and Other (See Comments)    Happened "in the 80's"   Other Other (See Comments)    "Transfer Dye" = "  Makes me tired"   Acetazolamide Anxiety and Other (See Comments)    Jittery, odd feeling (hyper feeling)    OBJECTIVE: Vitals:   06/01/22 1245 06/01/22 1400 06/01/22 1549 06/01/22 2129  BP: 127/70 (!) 142/57 (!) 93/47 (!) 107/94  Pulse: 84 (!) 101 98 97  Resp: '19  18 17  '$ Temp:  99.6 F (37.6 C) 98.1 F (36.7 C) 99.4 F (37.4 C)  TempSrc:  Oral Oral Oral  SpO2: 97% 99% 100% 100%  Weight:      Height:       Body mass index is 34.77 kg/m.  Physical Exam Constitutional:      General: He is not in acute distress.    Appearance: He is normal weight. He is not toxic-appearing.  HENT:     Head: Normocephalic and atraumatic.     Right Ear: External ear normal.     Left Ear: External ear normal.     Nose: No congestion or rhinorrhea.      Mouth/Throat:     Mouth: Mucous membranes are moist.     Pharynx: Oropharynx is clear.  Eyes:     Extraocular Movements: Extraocular movements intact.     Conjunctiva/sclera: Conjunctivae normal.     Pupils: Pupils are equal, round, and reactive to light.  Cardiovascular:     Rate and Rhythm: Normal rate and regular rhythm.     Heart sounds: No murmur heard.    No friction rub. No gallop.  Pulmonary:     Effort: Pulmonary effort is normal.     Breath sounds: Normal breath sounds.  Chest:     Comments: Chest wound C/D?I  No tenderness at PPM Abdominal:     General: Abdomen is flat. Bowel sounds are normal.     Palpations: Abdomen is soft.  Musculoskeletal:        General: No swelling. Normal range of motion.     Cervical back: Normal range of motion and neck supple.  Skin:    General: Skin is warm and dry.  Neurological:     General: No focal deficit present.     Mental Status: He is oriented to person, place, and time.  Psychiatric:        Mood and Affect: Mood normal.       Lab Results Lab Results  Component Value Date   WBC 13.3 (H) 06/01/2022   HGB 7.6 (L) 06/01/2022   HCT 23.3 (L) 06/01/2022   MCV 93.6 06/01/2022   PLT 155 06/01/2022    Lab Results  Component Value Date   CREATININE 6.52 (H) 06/01/2022   BUN 42 (H) 06/01/2022   NA 132 (L) 06/01/2022   K 4.2 06/01/2022   CL 94 (L) 06/01/2022   CO2 25 06/01/2022    Lab Results  Component Value Date   ALT 24 05/25/2022   AST 33 05/25/2022   ALKPHOS 66 05/25/2022   BILITOT 1.3 (H) 05/25/2022        Laurice Record, [place] [facility] 06/01/2022, 10:27 PM

## 2022-06-01 NOTE — Anesthesia Postprocedure Evaluation (Signed)
Anesthesia Post Note  Patient: [name].  Procedure(s) Performed: TRANSESOPHAGEAL ECHOCARDIOGRAM (TEE)     Patient location during evaluation: PACU Anesthesia Type: MAC Level of consciousness: awake Pain management: pain level controlled Vital Signs Assessment: post-procedure vital signs reviewed and stable Respiratory status: spontaneous breathing Cardiovascular status: stable Postop Assessment: no apparent nausea or vomiting Anesthetic complications: no  No notable events documented.  Last Vitals:  Vitals:   06/01/22 1030 06/01/22 1100  BP: 137/62 130/74  Pulse: 84   Resp: (!) 21 20  Temp:    SpO2:      Last Pain:  Vitals:   06/01/22 0820  TempSrc:   PainSc: 0-No pain                 Huston Foley

## 2022-06-01 NOTE — Plan of Care (Signed)
  Problem: Health Behavior/Discharge Planning: Goal: Ability to manage health-related needs will improve Outcome: Progressing   Problem: Metabolic: Goal: Ability to maintain appropriate glucose levels will improve Outcome: Progressing   Problem: Nutritional: Goal: Maintenance of adequate nutrition will improve Outcome: Progressing   Problem: Skin Integrity: Goal: Risk for impaired skin integrity will decrease Outcome: Progressing   Problem: Tissue Perfusion: Goal: Adequacy of tissue perfusion will improve Outcome: Progressing   Problem: Fluid Volume: Goal: Hemodynamic stability will improve Outcome: Progressing

## 2022-06-01 NOTE — Progress Notes (Signed)
Occupational Therapy Treatment Patient Details Name: [name]. MRN: [id number] DOB: October 20, 1955 Today's Date: 06/01/2022   History of present illness 67 yo male presenting to [facility] ED 2/20 from dialysis with possible infection. Diagnosised with Staph Lugdunensis bacteremia. Catheter removed on 2/23 for a line holiday. PMH bil BKA, HTN, insulin-dependent diabetes mellitus, PAD, anemia, and ESRD on hemodialysis.   OT comments  Limited visit with arrival of IV team, did not progress to OOB. Pt completed grooming and UB dressing seated EOB. Pt visibly uncomfortable, shifting side to side in sitting and reporting soreness in buttocks. Pt states he is to have surgery tomorrow at his daughter's insistence.    Recommendations for follow up therapy are one component of a multi-disciplinary discharge planning process, led by the attending physician.  Recommendations may be updated based on patient status, additional functional criteria and insurance authorization.    Follow Up Recommendations  Skilled nursing-short term rehab (<3 hours/day)     Assistance Recommended at Discharge Frequent or constant Supervision/Assistance  Patient can return home with the following  A lot of help with bathing/dressing/bathroom   Equipment Recommendations  BSC/3in1    Recommendations for Other Services      Precautions / Restrictions Precautions Precautions: Fall Restrictions Weight Bearing Restrictions: No       Mobility Bed Mobility Overal bed mobility: Modified Independent             General bed mobility comments: HOB up    Transfers                   General transfer comment: IV team arriving toward end of session     Balance Overall balance assessment: Needs assistance   Sitting balance-Leahy Scale: Good                                     ADL either performed or assessed with clinical judgement   ADL Overall ADL's : Needs  assistance/impaired Eating/Feeding: Set up;Sitting   Grooming: Wash/dry hands;Wash/dry face;Set up;Sitting           Upper Body Dressing : Minimal assistance;Sitting                          Extremity/Trunk Assessment              Vision       Perception     Praxis      Cognition Arousal/Alertness: Awake/alert Behavior During Therapy: Flat affect Overall Cognitive Status: Within Functional Limits for tasks assessed                                 General Comments: aware of medical course        Exercises      Shoulder Instructions       General Comments      Pertinent Vitals/ Pain       Pain Assessment Pain Assessment: Faces Faces Pain Scale: Hurts even more Pain Location: buttocks Pain Descriptors / Indicators: Discomfort, Sore, Restless  Home Living                                          Prior Functioning/Environment  Frequency  Min 2X/week        Progress Toward Goals  OT Goals(current goals can now be found in the care plan section)  Progress towards OT goals: Progressing toward goals  Acute Rehab OT Goals OT Goal Formulation: With patient Time For Goal Achievement: 06/11/22 Potential to Achieve Goals: Good  Plan Discharge plan remains appropriate    Co-evaluation                 AM-PAC OT "6 Clicks" Daily Activity     Outcome Measure   Help from another person eating meals?: None Help from another person taking care of personal grooming?: A Little Help from another person toileting, which includes using toliet, bedpan, or urinal?: A Lot Help from another person bathing (including washing, rinsing, drying)?: A Lot Help from another person to put on and taking off regular upper body clothing?: A Little Help from another person to put on and taking off regular lower body clothing?: A Lot 6 Click Score: 16    End of Session    OT Visit Diagnosis: Muscle  weakness (generalized) (M62.81)   Activity Tolerance Patient tolerated treatment well   Patient Left in bed;with call bell/phone within reach;Other (comment) (IV team)   Nurse Communication          [id number] OT Time Calculation (min): 15 min  Charges: OT General Charges $OT Visit: 1 Visit OT Treatments $Self Care/Home Management : 8-22 mins  Cleta Alberts, OTR/L Acute Rehabilitation Services Office: [phone]  [name] 06/01/2022, 2:57 PM

## 2022-06-01 NOTE — Procedures (Signed)
Patient was seen on dialysis and the procedure was supervised.  BFR 400  Via TDC BP is  137/62.   Patient appears to be tolerating treatment well. No c/o or concern. Goal UF 3-3.5 kg  [name] [employer] 06/01/2022

## 2022-06-01 NOTE — Progress Notes (Signed)
   06/01/22 1245  Vitals  Pulse Rate 84  Resp 19  BP 127/70  SpO2 97 %  O2 Device Nasal Cannula  Type of Weight Post-Dialysis  Oxygen Therapy  O2 Flow Rate (L/min) 3 L/min  Patient Activity (if Appropriate) In bed  Pulse Oximetry Type Continuous  Oximetry Probe Site Changed No  Post Treatment  Dialyzer Clearance Lightly streaked  Duration of HD Treatment -hour(s) 3.4 hour(s) (pt signed an AMA form to come off tx early---all parties are aware)  Hemodialysis Intake (mL) 0 mL  Liters Processed 77  Fluid Removed (mL) 3100 mL  Tolerated HD Treatment Yes  Post-Hemodialysis Comments see notes   Received patient in bed to unit.  Alert and oriented.  Informed consent signed and in chart.   [place]man duration:3.40  Patient tolerated well.  Transported back to the room  Alert, without acute distress.  Hand-off given to patient's nurse.   Access used: [facility] Access issues: no complications  Total UF removed: 3100 Medication(s) given: none    [facility]

## 2022-06-01 NOTE — Progress Notes (Signed)
Physical Therapy Treatment Patient Details Name: [name]. MRN: [id number] DOB: 12/19/1955 Today's Date: 06/01/2022   History of Present Illness 67 yo male presenting to [facility] ED 2/20 from dialysis with possible infection. Diagnosised with Staph Lugdunensis bacteremia. Catheter removed on 2/23 for a line holiday. PMH bil BKA, HTN, insulin-dependent diabetes mellitus, PAD, anemia, and ESRD on hemodialysis.    PT Comments    Pt with slow progress towards acute goals secondary to fatigue and pain. Pt seated EOB on arrival and requesting to posterior transfer to [facility], pt able to scoot halfway to [facility] with max assist, pt then abandoning transfer and lying on side and declining all further mobility. Pt declining repositioning help and bedpan. Pt educated on importance of continued mobility to maintain strength needed for functional transfers with pt verbalizing understanding however continuing to decline performing. Current plan remains appropriate to address deficits and maximize functional independence and decrease caregiver burden. Pt continues to benefit from skilled PT services to progress toward functional mobility goals.     Recommendations for follow up therapy are one component of a multi-disciplinary discharge planning process, led by the attending physician.  Recommendations may be updated based on patient status, additional functional criteria and insurance authorization.  Follow Up Recommendations  Skilled nursing-short term rehab (<3 hours/day) Can patient physically be transported by private vehicle: No   Assistance Recommended at Discharge Frequent or constant Supervision/Assistance  Patient can return home with the following A little help with walking and/or transfers;A little help with bathing/dressing/bathroom;Assist for transportation;Help with stairs or ramp for entrance   Equipment Recommendations  None recommended by PT    Recommendations for Other Services        Precautions / Restrictions Precautions Precautions: Fall Restrictions Weight Bearing Restrictions: No     Mobility  Bed Mobility Overal bed mobility: Modified Independent           Sit to sidelying: Supervision General bed mobility comments: seated EOB on arrival    Transfers Overall transfer level: Needs assistance Equipment used: None Transfers: Bed to chair/wheelchair/BSC         Anterior-Posterior transfers: Max assist   General transfer comment: pt inititally agreeable to laterl acoot trasnfer practivce to<>from recliner to simulate WC trasnfer at home, pt then requesting to posterior trasnfer to [facility], pt completing halfway before lying on side declining completing trasnfer, pt declining bedpan or repositioning.    Ambulation/Gait                   Stairs             Wheelchair Mobility    Modified Rankin (Stroke Patients Only)       Balance Overall balance assessment: Needs assistance   Sitting balance-Leahy Scale: Good                                      Cognition Arousal/Alertness: Awake/alert Behavior During Therapy: Flat affect Overall Cognitive Status: Within Functional Limits for tasks assessed                                 General Comments: pt with poor insight into deficits and safety during mobility        Exercises      General Comments General comments (skin integrity, edema, etc.): VSS on RA  Pertinent Vitals/Pain Pain Assessment Pain Assessment: Faces Faces Pain Scale: Hurts even more Pain Location: buttocks Pain Descriptors / Indicators: Discomfort, Sore, Restless Pain Intervention(s): Monitored during session, Limited activity within patient's tolerance, Repositioned    Home Living                          Prior Function            PT Goals (current goals can now be found in the care plan section) Acute Rehab PT Goals PT Goal Formulation: With  patient Time For Goal Achievement: 06/09/22 Progress towards PT goals: Not progressing toward goals - comment (fatigue?)    Frequency    Min 3X/week      PT Plan      Co-evaluation              AM-PAC PT "6 Clicks" Mobility   Outcome Measure  Help needed turning from your back to your side while in a flat bed without using bedrails?: A Little Help needed moving from lying on your back to sitting on the side of a flat bed without using bedrails?: A Little Help needed moving to and from a bed to a chair (including a wheelchair)?: Total Help needed standing up from a chair using your arms (e.g., wheelchair or bedside chair)?: Total Help needed to walk in hospital room?: Total Help needed climbing 3-5 steps with a railing? : Total 6 Click Score: 10    End of Session   Activity Tolerance: Patient tolerated treatment well;Other (comment) (declining further mobility) Patient left: in bed;with call bell/phone within reach Nurse Communication: Mobility status PT Visit Diagnosis: Other abnormalities of gait and mobility (R26.89);Muscle weakness (generalized) (M62.81)     Time: [id number] PT Time Calculation (min) (ACUTE ONLY): 19 min  Charges:  $Therapeutic Activity: 8-22 mins                     Micha Erck R. PTA Acute Rehabilitation Services Office: [place] 06/01/2022, 3:54 PM

## 2022-06-01 NOTE — Progress Notes (Signed)
PROGRESS NOTE                                                                                                                                                                                                             Patient Demographics:    [name], is a 67 y.o. male, DOB - Jun 30, 1955, [id number]  Outpatient Primary MD for the patient is [name], MD    LOS - 7  Admit date - 05/24/2022    Chief Complaint  Patient presents with   Shortness of Breath       Brief Narrative (HPI from H&P)   70 yom w/ hypertension,IDDM,PAD,anemia, pacemaker, and ESRD on HD, sent from dialysis for evaluation of bacteremia.   Patient severely fatigued for the past 1 week, missed dialysis 2/20 for this reason, and was contacted by dialysis personnel, informed that he had an infection in his blood, and was directed to the ED. No fever, chills, cough, dysuria, abdominal pain, vomiting, diarrhea, neck stiffness, or new rash. In [id number] and saturating mid 90s on room air. CT C/A/P notable for mild skin thickening and edema of the abdominal wall without gas or abscess, left renal lesion, right greater than left gynecomastia, and other nonacute findings.    He was admitted to the hospital for bacteremia in the presence of dialysis tunneled catheter, AICD, seen by nephrology, ID and EP.  Currently on IV antibiotics awaiting further workup.  He was transferred under my care on 05/30/2022 on day 5 of hospital stay.   Subjective:   Patient in bed, appears comfortable, denies any headache, no fever, no chest pain or pressure, no shortness of breath , no abdominal pain. No focal weakness.   Assessment  & Plan :     Bacteremia with HD center blood cx growing [place] with history of HD catheter and AICD device, TEE suspicious for aortic root abscess, AICD vegetation, tricuspid valve endocarditis -  Sent by HD to the ED due to bacteremia from  blood cultures on 2/15, underwent echocardiogram showing -   He is on IV antibiotics per ID, underwent tunneled catheter removal by IR on 05/27/2022, new tunneled right IJ dialysis catheter was placed on 05/30/2022 after line holidays.  On IV Ancef, repeat cultures thus far negative appears nontoxic, TEE as above, cards and cardiothoracic surgery along with ID  have been consulted.  Surgical intervention along with AICD removal will be deferred to cardiology and cardiothoracic surgery.  Patient is agreeable for surgery if needed.   Class 1 obesity due to excess calories with body mass index (BMI) of 33.0 to 33.9 in adult -Body mass index is 33.79 kg/m..  -Weight loss should be encouraged -Outpatient PCP/bariatric medicine f/u encouraged   Gynecomastia -Right more than left  -Incidentally noted on  CT  -Outpatient follow-up and consideration for mammogram is recommended    Kidney lesion, native, left -Incidentally noted on CT -Outpatient MRI recommended   ESRD (end stage renal disease) on dialysis ([facility]) -Patient on chronic MWF HD -Nephrology prn order set utilized -Due for HD again tomorrow post-catheter placement  Anemia -Associated with ESRD -However H&H serially dropping, no Alfonsa blood in stool, pending Hemoccult stool and haptoglobin, stable DIC panel, LDH , placed on PPI twice daily and monitor H&H closely.  Type screen done, no signs of brisk ongoing bleeding.  DM (diabetes mellitus), type 2 with peripheral vascular complications (HCC) -123456 5.6, good control -Continue glargine -Cover with very sensitive-scale SSI   CBG (last 3)  Recent Labs    05/31/22 1554 05/31/22 2036 06/01/22 0710  GLUCAP 125* 131* 118*     Essential hypertension -Continue amlodipine, carvedilol, clonidine, hydralazine  Hyperlipidemia -Continue rosuvastatin -Hold Vascepa      Condition - Extremely Guarded  Family Communication  : Called brother [name] on 05/31/2022 at 7:40 AM and message  left [phone]   Code Status :  Full  Consults  :  ID, EP, Renal, IR  PUD Prophylaxis : PPI   Procedures  :     TEE - 1. 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 Perforation of left coronary cusp aortic valve leaflet. The aortic valve is tricuspid. Aortic valve regurgitation is moderate to severe. Mild aortic stenosis.  3. Large vegetation measuring 1.5 cm x 1.5 cm appears attached to both ICD lead and septal lealfet of tricuspid valve on atrial side. Mild tricuspid regurgitation. There is another vegetation measuring 1.3 cm x 0.4 cm attached to ICD lead in right atrium.  4. Left ventricular ejection fraction, by estimation, is 45 to 50%. The left ventricle has mildly decreased function.  5. Right ventricular systolic function is normal. The right ventricular size is mildly enlarged  6. The mitral valve is degenerative. Trivial mitral valve regurgitation.  7. No left atrial/left atrial appendage thrombus was detected.   TTE - 1. Left ventricular ejection fraction, by estimation, is 45 to 50%. The left ventricle has mildly decreased function. The left ventricle demonstrates regional wall motion abnormalities (see scoring diagram/findings for description). There is moderate concentric left ventricular hypertrophy. Left ventricular diastolic parameters are consistent with Grade I diastolic dysfunction (impaired relaxation).  2. Right ventricular systolic function is normal. The right ventricular size is normal. There is normal pulmonary artery systolic pressure.  3. The mitral valve is degenerative. Trivial mitral valve regurgitation.  4. The aortic valve is tricuspid. There is mild calcification of the aortic valve. There is mild thickening of the aortic valve. Aortic valve regurgitation is not visualized. Mild aortic valve stenosis. Aortic valve mean gradient measures 11.0 mmHg.  5. Aortic dilatation noted. There is mild dilatation of the aortic root,  measuring 41 mm.  6. The inferior vena cava is normal in size with greater than 50% respiratory variability, suggesting right atrial pressure of 3 mmHg.  7. Device lead present in  RA/RV with thickening and echodensity (approximately 1cm x 2cm) noted on atrial side of tricuspid valve concerning for vegetation in the setting of known bacteremia. TEE indicated for further evaluation as lead extraction may need to be considered. Comparison(s): Prior images unable to be directly viewed.       Disposition Plan  :    Status is: Inpatient  DVT Prophylaxis  :    heparin injection 5,000 Units Start: 05/25/22 0600   Lab Results  Component Value Date   PLT 155 06/01/2022    Diet :  Diet Order             Diet renal/carb modified with fluid restriction Diet-HS Snack? Nothing; Fluid restriction: 1200 mL Fluid; Room service appropriate? Yes; Fluid consistency: Thin  Diet effective now                    Inpatient Medications  Scheduled Meds:  amLODipine  10 mg Oral QHS   aspirin  81 mg Oral QHS   brimonidine  1 drop Right Eye TID   calcitRIOL  2.25 mcg Oral Q M,W,F   calcium acetate  1,334 mg Oral TID WC   carvedilol  12.5 mg Oral Q M,W,F-2000   carvedilol  12.5 mg Oral 2 times per day on Sun Tue Thu Sat   Chlorhexidine Gluconate Cloth  6 each Topical Daily   Chlorhexidine Gluconate Cloth  6 each Topical Q0600   Chlorhexidine Gluconate Cloth  6 each Topical Q0600   cinacalcet  30 mg Oral Q M,W,F   cloNIDine  0.1 mg Oral QHS   clopidogrel  75 mg Oral QHS   cyanocobalamin  1,000 mcg Intramuscular Daily   [START ON 06/03/2022] vitamin B-12  1,000 mcg Oral Daily   darbepoetin (ARANESP) injection - NON-DIALYSIS  150 mcg Subcutaneous Q [id number]   folic acid  1 mg Oral QHS   gabapentin  300 mg Oral QHS   heparin  5,000 Units Subcutaneous Q8H   hydrALAZINE  25 mg Oral 2 times per day on Sun Tue Thu Sat   hydrALAZINE  25 mg Oral Q M,W,F-2000   insulin aspart  0-6 Units Subcutaneous TID WC    insulin aspart  3 Units Subcutaneous TID WC   insulin glargine-yfgn  10 Units Subcutaneous QHS   pantoprazole  40 mg Oral BID   rosuvastatin  20 mg Oral QHS   sodium chloride flush  3 mL Intravenous Q12H   Continuous Infusions:  albumin human 25 g (05/27/22 0930)   anticoagulant sodium citrate      ceFAZolin (ANCEF) IV     PRN Meds:.acetaminophen **OR** acetaminophen, albumin human, alteplase, anticoagulant sodium citrate, heparin, heparin, hydrALAZINE, ondansetron **OR** ondansetron (ZOFRAN) IV, oxyCODONE   Objective:   Vitals:   05/31/22 1924 06/01/22 0437 06/01/22 0829 06/01/22 0845  BP: (!) 133/42 128/64 (!) 145/77 (!) 150/88  Pulse: 73 76 85 83  Resp: 18 17 (!) 27 (!) 21  Temp: 97.6 F (36.4 C) 98.1 F (36.7 C) 98.4 F (36.9 C)   TempSrc: Oral Oral    SpO2: 93% 98% 94% 100%  Weight:   116.3 kg   Height:        Wt Readings from Last 3 Encounters:  06/01/22 116.3 kg  05/16/22 117.9 kg  02/08/22 117.9 kg     Intake/Output Summary (Last 24 hours) at 06/01/2022 0902 Last data filed at 05/31/2022 1700 Gross per 24 hour  Intake 540 ml  Output --  Net 540  ml     Physical Exam  Awake Alert, No new F.N deficits, R.IJ HD [place].AT,PERRAL Supple Neck, No JVD,   Symmetrical Chest wall movement, Good air movement bilaterally, CTAB RRR,No Gallops,Rubs or new Murmurs,  +ve B.Sounds, Abd Soft, No tenderness,   L BKA, R AKA   Labs  Recent Labs  Lab 05/28/22 1151 05/29/22 0409 05/30/22 0047 05/30/22 0746 05/31/22 0417 06/01/22 0254  WBC 20.0* 17.6* 14.8*  --  21.0* 13.3*  HGB 8.8* 8.0* 7.7*  --  8.7* 7.6*  HCT 24.9* 24.0* 24.1*  --  26.3* 23.3*  PLT PLATELET CLUMPS NOTED ON SMEAR, UNABLE TO ESTIMATE 166 184 195 160 155  MCV 87.7 93.4 93.1  --  92.6 93.6  MCH 31.0 31.1 29.7  --  30.6 30.5  MCHC 35.3 33.3 32.0  --  33.1 32.6  RDW 16.8* 17.3* 16.8*  --  16.5* 16.5*  LYMPHSABS 1.1 1.1 1.1  --  0.5* 1.0  MONOABS 1.2* 1.0 0.8  --  0.8 0.9  EOSABS 0.2 0.2 0.2   --  0.1 0.3  BASOSABS 0.0 0.0 0.0  --  0.0 0.0    Recent Labs  Lab 05/28/22 1151 05/29/22 0414 05/30/22 0047 05/30/22 0746 05/31/22 0417 06/01/22 0254  NA 131* 131* 128*  --  132* 132*  K 4.5 3.8 4.1  --  3.9 4.2  CL 94* 93* 90*  --  93* 94*  CO2 21* 22 23  --  22 25  ANIONGAP 16* 16* 15  --  17* 13  GLUCOSE 210* 183* 178*  --  196* 149*  BUN 45* 50* 57*  --  33* 42*  CREATININE 5.99* 7.09* 8.03*  --  5.19* 6.52*  ALBUMIN  --  2.1* 2.1*  --  2.2* 2.1*  DDIMER  --   --   --  2.26*  --   --   INR  --   --  1.3* 1.2  --   --   CALCIUM 8.9 9.3 9.5  --  9.2 9.2    Radiology Reports ECHO TEE  Result Date: 05/31/2022    TRANSESOPHOGEAL ECHO REPORT   Patient Name:   Malekai Carle. Date of Exam: 05/31/2022 Medical Rec #:  [id number]         Height:       72.0 in Accession #:    [id number]        Weight:       249.1 lb Date of Birth:  01-22-1956         BSA:          2.339 m Patient Age:    22 years          BP:           111/46 mmHg Patient Gender: M                 HR:           72 bpm. Exam Location:  Inpatient Procedure: 3D Echo, Color Doppler, Cardiac Doppler and Transesophageal Echo Indications:    Bacteremia  History:        Patient has prior history of Echocardiogram examinations, most                 recent 05/27/2022. Cardiomyopathy and CHF, Defibrillator, Stroke                 and TIA, Signs/Symptoms:Dyspnea; Risk Factors:Current Smoker,  Hypertension, Dyslipidemia and Diabetes. CKD.  Sonographer:    Eartha Inch Referring Phys: [id number] [place]: After discussion of the risks and benefits of a TEE, an informed consent was obtained from the patient. The transesophogeal probe was passed without difficulty through the esophogus of the patient. Imaged were obtained with the patient in a left lateral decubitus position. Sedation performed by different physician. The patient was monitored while under deep sedation. Anesthestetic sedation was  provided intravenously by Anesthesiology: 211.'31mg'$  of Propofol. Image quality was good. The patient's vital signs; including heart rate, blood pressure, and oxygen saturation; remained stable throughout the procedure. The patient developed no complications during the procedure.  IMPRESSIONS  1. 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 Perforation of left coronary cusp aortic valve leaflet. The aortic valve is tricuspid. Aortic valve regurgitation is moderate to severe. Mild aortic stenosis.  3. Large vegetation measuring 1.5 cm x 1.5 cm appears attached to both ICD lead and septal lealfet of tricuspid valve on atrial side. Mild tricuspid regurgitation. There is another vegetation measuring 1.3 cm x 0.4 cm attached to ICD lead in right atrium.  4. Left ventricular ejection fraction, by estimation, is 45 to 50%. The left ventricle has mildly decreased function.  5. Right ventricular systolic function is normal. The right ventricular size is mildly enlarged  6. The mitral valve is degenerative. Trivial mitral valve regurgitation.  7. No left atrial/left atrial appendage thrombus was detected. FINDINGS  Left Ventricle: Left ventricular ejection fraction, by estimation, is 45 to 50%. The left ventricle has mildly decreased function. The left ventricular internal cavity size was normal in size. Right Ventricle: The right ventricular size is mildly enlarged. No increase in right ventricular wall thickness. Right ventricular systolic function is normal. Left Atrium: Left atrial size was normal in size. No left atrial/left atrial appendage thrombus was detected. Right Atrium: Right atrial size was normal in size. Pericardium: There is no evidence of pericardial effusion. Mitral Valve: The mitral valve is degenerative in appearance. Trivial mitral valve regurgitation. Tricuspid Valve: The tricuspid valve is abnormal. Tricuspid valve regurgitation is mild. Aortic Valve: The aortic  valve is tricuspid. Aortic valve regurgitation is moderate to severe. Aortic regurgitation PHT measures 392 msec. Mild aortic stenosis is present. Aortic valve mean gradient measures 10.0 mmHg. Aortic valve peak gradient measures  19.9 mmHg. Aortic valve area, by VTI measures 1.81 cm. Pulmonic Valve: The pulmonic valve was grossly normal. Pulmonic valve regurgitation is trivial. Aorta: The aortic root is normal in size and structure. IAS/Shunts: No atrial level shunt detected by color flow Doppler. Additional Comments: Spectral Doppler performed. LEFT VENTRICLE PLAX 2D LVOT diam:     2.10 cm LV SV:         85 LV SV Index:   36 LVOT Area:     3.46 cm  AORTIC VALVE AV Area (Vmax):    1.83 cm AV Area (Vmean):   1.70 cm AV Area (VTI):     1.81 cm AV Vmax:           223.00 cm/s AV Vmean:          150.000 cm/s AV VTI:            0.468 m AV Peak Grad:      19.9 mmHg AV Mean Grad:      10.0 mmHg LVOT Vmax:         118.00 cm/s LVOT Vmean:  73.600 cm/s LVOT VTI:          0.245 m LVOT/AV VTI ratio: 0.52 AI PHT:            392 msec  AORTA Ao Root diam: 3.70 cm  SHUNTS Systemic VTI:  0.24 m Systemic Diam: 2.10 cm Oswaldo Milian MD Electronically signed by Oswaldo Milian MD Signature Date/Time: 05/31/2022/1:43:49 PM    Final    IR US Guide Vasc Access Right  Result Date: 05/30/2022 CLINICAL DATA:  End-stage renal disease and removal of previously placed right IJ tunneled hemodialysis catheter on 05/27/2022 due to bacteremia. The patient has now been satisfactory treated for bacteremia and requires a new tunneled hemodialysis catheter for continued hemodialysis needs. EXAM: TUNNELED CENTRAL VENOUS HEMODIALYSIS CATHETER PLACEMENT WITH ULTRASOUND AND FLUOROSCOPIC GUIDANCE ANESTHESIA/SEDATION: Moderate (conscious) sedation was employed during this procedure. A total of Versed 1.0 mg and Fentanyl 50 mcg was administered intravenously by radiology nursing. Moderate Sedation Time: 27 minutes. The patient's  level of consciousness and vital signs were monitored continuously by radiology nursing throughout the procedure under my direct supervision. MEDICATIONS: 2 g IV Ancef. FLUOROSCOPY: 5 minutes and 30 seconds.  130 mGy. PROCEDURE: The procedure, risks, benefits, and alternatives were explained to the patient. Questions regarding the procedure were encouraged and answered. The patient understands and consents to the procedure. A timeout was performed prior to initiating the procedure. A time-out was performed prior to initiating the procedure. The right neck and chest were prepped with chlorhexidine in a sterile fashion, and a sterile drape was applied covering the operative field. Maximum barrier sterile technique with sterile gowns and gloves were used for the procedure. Local anesthesia was provided with 1% lidocaine. Ultrasound was used to confirm patency of the right internal jugular vein. A permanent ultrasound image was saved and recorded. After creating a small venotomy incision, a 21 gauge needle was advanced into the right internal jugular vein under direct, real-time ultrasound guidance. Ultrasound image documentation was performed. After securing guidewire access, an 8 Fr dilator was placed. A J-wire was kinked to measure appropriate catheter length. A Palindrome tunneled hemodialysis catheter measuring 19 cm from tip to cuff was chosen for placement. This was tunneled in a retrograde fashion from the chest wall to the venotomy incision. At the venotomy, serial dilatation was performed and a 15 Fr peel-away sheath was placed over a guidewire. The catheter was then placed through the sheath and the sheath removed. Final catheter positioning was confirmed and documented with a fluoroscopic spot image. The catheter was aspirated, flushed with saline, and injected with appropriate volume heparin dwells. The venotomy incision was closed with subcuticular 4-0 Vicryl. Dermabond was applied to the incision. The  catheter exit site was secured with 0-Prolene retention sutures. COMPLICATIONS: None.  No pneumothorax. FINDINGS: After catheter placement, the tip lies in the right atrium. The catheter aspirates normally and is ready for immediate use. IMPRESSION: Placement of tunneled hemodialysis catheter via the right internal jugular vein. The catheter tip lies in the right atrium. The catheter is ready for immediate use. Electronically Signed   By: Aletta Edouard M.D.   On: 05/30/2022 13:25   IR Fluoro Guide CV Line Right  Result Date: 05/30/2022 CLINICAL DATA:  End-stage renal disease and removal of previously placed right IJ tunneled hemodialysis catheter on 05/27/2022 due to bacteremia. The patient has now been satisfactory treated for bacteremia and requires a new tunneled hemodialysis catheter for continued hemodialysis needs. EXAM: TUNNELED CENTRAL VENOUS HEMODIALYSIS CATHETER PLACEMENT WITH ULTRASOUND  AND FLUOROSCOPIC GUIDANCE ANESTHESIA/SEDATION: Moderate (conscious) sedation was employed during this procedure. A total of Versed 1.0 mg and Fentanyl 50 mcg was administered intravenously by radiology nursing. Moderate Sedation Time: 27 minutes. The patient's level of consciousness and vital signs were monitored continuously by radiology nursing throughout the procedure under my direct supervision. MEDICATIONS: 2 g IV Ancef. FLUOROSCOPY: 5 minutes and 30 seconds.  130 mGy. PROCEDURE: The procedure, risks, benefits, and alternatives were explained to the patient. Questions regarding the procedure were encouraged and answered. The patient understands and consents to the procedure. A timeout was performed prior to initiating the procedure. A time-out was performed prior to initiating the procedure. The right neck and chest were prepped with chlorhexidine in a sterile fashion, and a sterile drape was applied covering the operative field. Maximum barrier sterile technique with sterile gowns and gloves were used for the  procedure. Local anesthesia was provided with 1% lidocaine. Ultrasound was used to confirm patency of the right internal jugular vein. A permanent ultrasound image was saved and recorded. After creating a small venotomy incision, a 21 gauge needle was advanced into the right internal jugular vein under direct, real-time ultrasound guidance. Ultrasound image documentation was performed. After securing guidewire access, an 8 Fr dilator was placed. A J-wire was kinked to measure appropriate catheter length. A Palindrome tunneled hemodialysis catheter measuring 19 cm from tip to cuff was chosen for placement. This was tunneled in a retrograde fashion from the chest wall to the venotomy incision. At the venotomy, serial dilatation was performed and a 15 Fr peel-away sheath was placed over a guidewire. The catheter was then placed through the sheath and the sheath removed. Final catheter positioning was confirmed and documented with a fluoroscopic spot image. The catheter was aspirated, flushed with saline, and injected with appropriate volume heparin dwells. The venotomy incision was closed with subcuticular 4-0 Vicryl. Dermabond was applied to the incision. The catheter exit site was secured with 0-Prolene retention sutures. COMPLICATIONS: None.  No pneumothorax. FINDINGS: After catheter placement, the tip lies in the right atrium. The catheter aspirates normally and is ready for immediate use. IMPRESSION: Placement of tunneled hemodialysis catheter via the right internal jugular vein. The catheter tip lies in the right atrium. The catheter is ready for immediate use. Electronically Signed   By: Aletta Edouard M.D.   On: 05/30/2022 13:25      Signature  -   Lala Lund M.D on 06/01/2022 at 9:02 AM   -  To page go to [email or web address]

## 2022-06-01 NOTE — Progress Notes (Signed)
Patient is very unlikely to be a surgical candidate.   Recommend continue medical treatment  Reasonable to remove any indwelling lines that are possible (I.e. PPM or ICD wires)  Not in room when rounded. Will see this afternoon or early in AM.

## 2022-06-02 DIAGNOSIS — I361 Nonrheumatic tricuspid (valve) insufficiency: Secondary | ICD-10-CM | POA: Diagnosis not present

## 2022-06-02 DIAGNOSIS — I339 Acute and subacute endocarditis, unspecified: Secondary | ICD-10-CM | POA: Diagnosis not present

## 2022-06-02 DIAGNOSIS — I351 Nonrheumatic aortic (valve) insufficiency: Secondary | ICD-10-CM | POA: Diagnosis not present

## 2022-06-02 DIAGNOSIS — R7881 Bacteremia: Secondary | ICD-10-CM | POA: Diagnosis not present

## 2022-06-02 LAB — CBC WITH DIFFERENTIAL/PLATELET
Abs Immature Granulocytes: 0.12 10*3/uL — ABNORMAL HIGH (ref 0.00–0.07)
Basophils Absolute: 0 10*3/uL (ref 0.0–0.1)
Basophils Relative: 0 %
Eosinophils Absolute: 0.2 10*3/uL (ref 0.0–0.5)
Eosinophils Relative: 1 %
HCT: 22.5 % — ABNORMAL LOW (ref 39.0–52.0)
Hemoglobin: 7.2 g/dL — ABNORMAL LOW (ref 13.0–17.0)
Immature Granulocytes: 1 %
Lymphocytes Relative: 9 %
Lymphs Abs: 1.1 10*3/uL (ref 0.7–4.0)
MCH: 30.1 pg (ref 26.0–34.0)
MCHC: 32 g/dL (ref 30.0–36.0)
MCV: 94.1 fL (ref 80.0–100.0)
Monocytes Absolute: 0.9 10*3/uL (ref 0.1–1.0)
Monocytes Relative: 7 %
Neutro Abs: 9.9 10*3/uL — ABNORMAL HIGH (ref 1.7–7.7)
Neutrophils Relative %: 82 %
Platelets: 141 10*3/uL — ABNORMAL LOW (ref 150–400)
RBC: 2.39 MIL/uL — ABNORMAL LOW (ref 4.22–5.81)
RDW: 16.1 % — ABNORMAL HIGH (ref 11.5–15.5)
WBC: 12.1 10*3/uL — ABNORMAL HIGH (ref 4.0–10.5)
nRBC: 0 % (ref 0.0–0.2)

## 2022-06-02 LAB — GLUCOSE, CAPILLARY
Glucose-Capillary: 135 mg/dL — ABNORMAL HIGH (ref 70–99)
Glucose-Capillary: 165 mg/dL — ABNORMAL HIGH (ref 70–99)
Glucose-Capillary: 197 mg/dL — ABNORMAL HIGH (ref 70–99)
Glucose-Capillary: 173 mg/dL — ABNORMAL HIGH (ref 70–99)

## 2022-06-02 LAB — RENAL FUNCTION PANEL
Albumin: 2 g/dL — ABNORMAL LOW (ref 3.5–5.0)
Anion gap: 10 (ref 5–15)
BUN: 28 mg/dL — ABNORMAL HIGH (ref 8–23)
CO2: 28 mmol/L (ref 22–32)
Calcium: 9.1 mg/dL (ref 8.9–10.3)
Chloride: 94 mmol/L — ABNORMAL LOW (ref 98–111)
Creatinine, Ser: 5.06 mg/dL — ABNORMAL HIGH (ref 0.61–1.24)
GFR, Estimated: 12 mL/min — ABNORMAL LOW (ref >60)
Glucose, Bld: 185 mg/dL — ABNORMAL HIGH (ref 70–99)
Phosphorus: 3.7 mg/dL (ref 2.5–4.6)
Potassium: 3.9 mmol/L (ref 3.5–5.1)
Sodium: 132 mmol/L — ABNORMAL LOW (ref 135–145)

## 2022-06-02 MED ORDER — CEFAZOLIN SODIUM-DEXTROSE 2-4 GM/100ML-% IV SOLN
2.0000 g | INTRAVENOUS | Status: DC
  Administered 2022-06-03 – 2022-06-06 (×2): 2 g via INTRAVENOUS
  Filled 2022-06-02 (×2): qty 100

## 2022-06-02 MED ORDER — CHLORHEXIDINE GLUCONATE CLOTH 2 % EX PADS
6.0000 | MEDICATED_PAD | Freq: Every day | CUTANEOUS | Status: DC
  Administered 2022-06-03 – 2022-06-05 (×3): 6 via TOPICAL

## 2022-06-02 MED ORDER — SODIUM CHLORIDE 0.9 % IV BOLUS
250.0000 mL | Freq: Once | INTRAVENOUS | Status: AC
  Administered 2022-06-02: 250 mL via INTRAVENOUS

## 2022-06-02 NOTE — Progress Notes (Addendum)
  With significant infective endocarditis burden involving aortic root, aortic valve, tricuspid valve, and his AICD lead, as well as his extensive co-morbidities, pt is not a candidate for Lead Extraction.   Recommend palliative care consult.   Legrand Como [address]" [place], PA-C  06/02/2022 11:08 AM

## 2022-06-02 NOTE — Progress Notes (Signed)
PROGRESS NOTE                                                                                                                                                                                                             Patient Demographics:    [name], is a 67 y.o. male, DOB - [birth year], [id number]  Outpatient Primary MD for the patient is [name], MD    LOS - 8  Admit date - 05/24/2022    Chief Complaint  Patient presents with   Shortness of Breath       Brief Narrative (HPI from H&P)   66 yom w/ hypertension,IDDM,PAD,anemia, pacemaker, and ESRD on HD, sent from dialysis for evaluation of bacteremia.   Patient severely fatigued for the past 1 week, missed dialysis 2/20 for this reason, and was contacted by dialysis personnel, informed that he had an infection in his blood, and was directed to the ED. No fever, chills, cough, dysuria, abdominal pain, vomiting, diarrhea, neck stiffness, or new rash. In [id number] and saturating mid 90s on room air. CT C/A/P notable for mild skin thickening and edema of the abdominal wall without gas or abscess, left renal lesion, right greater than left gynecomastia, and other nonacute findings.    He was admitted to the hospital for bacteremia in the presence of dialysis tunneled catheter, AICD, seen by nephrology, ID and EP.  Currently on IV antibiotics awaiting further workup.  He was transferred under my care on 05/30/2022 on day 5 of hospital stay.   Subjective:   Patient in bed, appears comfortable, denies any headache, no fever, no chest pain or pressure, no shortness of breath , no abdominal pain. No focal weakness.   Assessment  & Plan :     Bacteremia with HD center blood cx growing [place] with history of HD catheter and AICD device, TEE suspicious for aortic root abscess, AICD vegetation, tricuspid valve endocarditis -  Sent by HD to the ED due to bacteremia from  blood cultures on 2/15, underwent echocardiogram showing -   He is on IV antibiotics per ID, underwent tunneled catheter removal by IR on 05/27/2022, new tunneled right IJ dialysis catheter was placed on 05/30/2022 after line holidays.  On IV Ancef, repeat cultures thus far negative appears nontoxic, TEE as above, cards and cardiothoracic surgery along with ID  have been consulted.  Surgical intervention along with AICD removal will be deferred to cardiology and cardiothoracic surgery.  Patient is agreeable for surgery if needed.   Class 1 obesity due to excess calories with body mass index (BMI) of 33.0 to 33.9 in adult -Body mass index is 33.79 kg/m..  -Weight loss should be encouraged -Outpatient PCP/bariatric medicine f/u encouraged   Gynecomastia -Right more than left  -Incidentally noted on  CT  -Outpatient follow-up and consideration for mammogram is recommended    Kidney lesion, native, left -Incidentally noted on CT -Outpatient MRI recommended   ESRD (end stage renal disease) on dialysis ([place]) -Patient on chronic MWF HD -Nephrology prn order set utilized -Due for HD again tomorrow post-catheter placement  Anemia -Associated with ESRD -However H&H serially dropping, no [place] blood in stool, pending Hemoccult stool  stable DIC panel, LDH , Haptoglobin, placed on PPI twice daily and monitor H&H closely.  Type screen done, no signs of brisk ongoing bleeding.  DM (diabetes mellitus), type 2 with peripheral vascular complications (HCC) -123456 5.6, good control -Continue glargine -Cover with very sensitive-scale SSI   CBG (last 3)  Recent Labs    06/01/22 1549 06/01/22 2132 06/02/22 0721  GLUCAP 166* 129* 135*     Essential hypertension -Continue amlodipine, carvedilol, clonidine, hydralazine  Hyperlipidemia -Continue rosuvastatin -Hold Vascepa      Condition - Extremely Guarded  Family Communication  : Called brother [name] on 05/31/2022 at 7:40 AM and message left  [phone]   Code Status :  Full  Consults  :  ID, EP, Renal, IR  PUD Prophylaxis : PPI   Procedures  :     TEE - 1. 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 Perforation of left coronary cusp aortic valve leaflet. The aortic valve is tricuspid. Aortic valve regurgitation is moderate to severe. Mild aortic stenosis.  3. Large vegetation measuring 1.5 cm x 1.5 cm appears attached to both ICD lead and septal lealfet of tricuspid valve on atrial side. Mild tricuspid regurgitation. There is another vegetation measuring 1.3 cm x 0.4 cm attached to ICD lead in right atrium.  4. Left ventricular ejection fraction, by estimation, is 45 to 50%. The left ventricle has mildly decreased function.  5. Right ventricular systolic function is normal. The right ventricular size is mildly enlarged  6. The mitral valve is degenerative. Trivial mitral valve regurgitation.  7. No left atrial/left atrial appendage thrombus was detected.   TTE - 1. Left ventricular ejection fraction, by estimation, is 45 to 50%. The left ventricle has mildly decreased function. The left ventricle demonstrates regional wall motion abnormalities (see scoring diagram/findings for description). There is moderate concentric left ventricular hypertrophy. Left ventricular diastolic parameters are consistent with Grade I diastolic dysfunction (impaired relaxation).  2. Right ventricular systolic function is normal. The right ventricular size is normal. There is normal pulmonary artery systolic pressure.  3. The mitral valve is degenerative. Trivial mitral valve regurgitation.  4. The aortic valve is tricuspid. There is mild calcification of the aortic valve. There is mild thickening of the aortic valve. Aortic valve regurgitation is not visualized. Mild aortic valve stenosis. Aortic valve mean gradient measures 11.0 mmHg.  5. Aortic dilatation noted. There is mild dilatation of the aortic root, measuring  41 mm.  6. The inferior vena cava is normal in size with greater than 50% respiratory variability, suggesting right atrial pressure of 3 mmHg.  7. Device lead present in  RA/RV with thickening and echodensity (approximately 1cm x 2cm) noted on atrial side of tricuspid valve concerning for vegetation in the setting of known bacteremia. TEE indicated for further evaluation as lead extraction may need to be considered. Comparison(s): Prior images unable to be directly viewed.       Disposition Plan  :    Status is: Inpatient  DVT Prophylaxis  :    heparin injection 5,000 Units Start: 05/25/22 0600   Lab Results  Component Value Date   PLT 141 (L) 06/02/2022    Diet :  Diet Order             Diet renal/carb modified with fluid restriction Diet-HS Snack? Nothing; Fluid restriction: 1200 mL Fluid; Room service appropriate? Yes; Fluid consistency: Thin  Diet effective now                    Inpatient Medications  Scheduled Meds:  amLODipine  10 mg Oral QHS   aspirin  81 mg Oral QHS   brimonidine  1 drop Right Eye TID   calcitRIOL  2.25 mcg Oral Q M,W,F   calcium acetate  1,334 mg Oral TID WC   carvedilol  12.5 mg Oral Q M,W,F-2000   carvedilol  12.5 mg Oral 2 times per day on Sun Tue Thu Sat   Chlorhexidine Gluconate Cloth  6 each Topical Daily   Chlorhexidine Gluconate Cloth  6 each Topical Q0600   Chlorhexidine Gluconate Cloth  6 each Topical Q0600   cinacalcet  30 mg Oral Q M,W,F   cloNIDine  0.1 mg Oral QHS   clopidogrel  75 mg Oral QHS   cyanocobalamin  1,000 mcg Intramuscular Daily   [START ON 06/03/2022] vitamin B-12  1,000 mcg Oral Daily   darbepoetin (ARANESP) injection - NON-DIALYSIS  150 mcg Subcutaneous Q [id number]   folic acid  1 mg Oral QHS   gabapentin  300 mg Oral QHS   heparin  5,000 Units Subcutaneous Q8H   hydrALAZINE  25 mg Oral 2 times per day on Sun Tue Thu Sat   hydrALAZINE  25 mg Oral Q M,W,F-2000   insulin aspart  0-6 Units Subcutaneous TID WC    insulin aspart  3 Units Subcutaneous TID WC   insulin glargine-yfgn  10 Units Subcutaneous QHS   pantoprazole  40 mg Oral BID   rosuvastatin  20 mg Oral QHS   sodium chloride flush  10-40 mL Intracatheter Q12H   sodium chloride flush  3 mL Intravenous Q12H   Continuous Infusions:  albumin human 25 g (05/27/22 0930)   [START ON 06/03/2022]  ceFAZolin (ANCEF) IV     PRN Meds:.acetaminophen **OR** acetaminophen, albumin human, hydrALAZINE, ondansetron **OR** ondansetron (ZOFRAN) IV, oxyCODONE   Objective:   Vitals:   06/01/22 1400 06/01/22 1549 06/01/22 2129 06/02/22 0409  BP: (!) 142/57 (!) 93/47 (!) 107/94 (!) 110/95  Pulse: (!) 101 98 97 97  Resp:  '18 17 17  '$ Temp: 99.6 F (37.6 C) 98.1 F (36.7 C) 99.4 F (37.4 C) 99.3 F (37.4 C)  TempSrc: Oral Oral Oral Oral  SpO2: 99% 100% 100% 100%  Weight:      Height:        Wt Readings from Last 3 Encounters:  06/01/22 116.3 kg  05/16/22 117.9 kg  02/08/22 117.9 kg     Intake/Output Summary (Last 24 hours) at 06/02/2022 0909 Last data filed at 06/02/2022 0813 Gross per 24 hour  Intake 8703 ml  Output 3100  ml  Net 5603 ml     Physical Exam  Awake Alert, No new F.N deficits, R.IJ HD [place].AT,PERRAL Supple Neck, No JVD,   Symmetrical Chest wall movement, Good air movement bilaterally, CTAB RRR,No Gallops,Rubs or new Murmurs,  +ve B.Sounds, Abd Soft, No tenderness,   L BKA, R AKA   Labs  Recent Labs  Lab 05/29/22 0409 05/30/22 0047 05/30/22 0746 05/31/22 0417 06/01/22 0254 06/02/22 0349  WBC 17.6* 14.8*  --  21.0* 13.3* 12.1*  HGB 8.0* 7.7*  --  8.7* 7.6* 7.2*  HCT 24.0* 24.1*  --  26.3* 23.3* 22.5*  PLT 166 184 195 160 155 141*  MCV 93.4 93.1  --  92.6 93.6 94.1  MCH 31.1 29.7  --  30.6 30.5 30.1  MCHC 33.3 32.0  --  33.1 32.6 32.0  RDW 17.3* 16.8*  --  16.5* 16.5* 16.1*  LYMPHSABS 1.1 1.1  --  0.5* 1.0 1.1  MONOABS 1.0 0.8  --  0.8 0.9 0.9  EOSABS 0.2 0.2  --  0.1 0.3 0.2  BASOSABS 0.0 0.0  --  0.0 0.0  0.0    Recent Labs  Lab 05/29/22 0414 05/30/22 0047 05/30/22 0746 05/31/22 0417 06/01/22 0254 06/02/22 0349  NA 131* 128*  --  132* 132* 132*  K 3.8 4.1  --  3.9 4.2 3.9  CL 93* 90*  --  93* 94* 94*  CO2 22 23  --  '22 25 28  '$ ANIONGAP 16* 15  --  17* 13 10  GLUCOSE 183* 178*  --  196* 149* 185*  BUN 50* 57*  --  33* 42* 28*  CREATININE 7.09* 8.03*  --  5.19* 6.52* 5.06*  ALBUMIN 2.1* 2.1*  --  2.2* 2.1* 2.0*  DDIMER  --   --  2.26*  --   --   --   INR  --  1.3* 1.2  --   --   --   CALCIUM 9.3 9.5  --  9.2 9.2 9.1    Radiology Reports ECHO TEE  Result Date: 05/31/2022    TRANSESOPHOGEAL ECHO REPORT   Patient Name:   [name]. Date of Exam: 05/31/2022 Medical Rec #:  [id number]         Height:       72.0 in Accession #:    [id number]        Weight:       249.1 lb Date of Birth:  04/18/1955         BSA:          2.339 m Patient Age:    61 years          BP:           111/46 mmHg Patient Gender: M                 HR:           72 bpm. Exam Location:  Inpatient Procedure: 3D Echo, Color Doppler, Cardiac Doppler and Transesophageal Echo Indications:    Bacteremia  History:        Patient has prior history of Echocardiogram examinations, most                 recent 05/27/2022. Cardiomyopathy and CHF, Defibrillator, Stroke                 and TIA, Signs/Symptoms:Dyspnea; Risk Factors:Current Smoker,  Hypertension, Dyslipidemia and Diabetes. CKD.  Sonographer:    Eartha Inch Referring Phys: [id number] McDonald: After discussion of the risks and benefits of a TEE, an informed consent was obtained from the patient. The transesophogeal probe was passed without difficulty through the esophogus of the patient. Imaged were obtained with the patient in a left lateral decubitus position. Sedation performed by different physician. The patient was monitored while under deep sedation. Anesthestetic sedation was provided intravenously by Anesthesiology: 211.'31mg'$  of  Propofol. Image quality was good. The patient's vital signs; including heart rate, blood pressure, and oxygen saturation; remained stable throughout the procedure. The patient developed no complications during the procedure.  IMPRESSIONS  1. 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 Perforation of left coronary cusp aortic valve leaflet. The aortic valve is tricuspid. Aortic valve regurgitation is moderate to severe. Mild aortic stenosis.  3. Large vegetation measuring 1.5 cm x 1.5 cm appears attached to both ICD lead and septal lealfet of tricuspid valve on atrial side. Mild tricuspid regurgitation. There is another vegetation measuring 1.3 cm x 0.4 cm attached to ICD lead in right atrium.  4. Left ventricular ejection fraction, by estimation, is 45 to 50%. The left ventricle has mildly decreased function.  5. Right ventricular systolic function is normal. The right ventricular size is mildly enlarged  6. The mitral valve is degenerative. Trivial mitral valve regurgitation.  7. No left atrial/left atrial appendage thrombus was detected. FINDINGS  Left Ventricle: Left ventricular ejection fraction, by estimation, is 45 to 50%. The left ventricle has mildly decreased function. The left ventricular internal cavity size was normal in size. Right Ventricle: The right ventricular size is mildly enlarged. No increase in right ventricular wall thickness. Right ventricular systolic function is normal. Left Atrium: Left atrial size was normal in size. No left atrial/left atrial appendage thrombus was detected. Right Atrium: Right atrial size was normal in size. Pericardium: There is no evidence of pericardial effusion. Mitral Valve: The mitral valve is degenerative in appearance. Trivial mitral valve regurgitation. Tricuspid Valve: The tricuspid valve is abnormal. Tricuspid valve regurgitation is mild. Aortic Valve: The aortic valve is tricuspid. Aortic valve regurgitation is  moderate to severe. Aortic regurgitation PHT measures 392 msec. Mild aortic stenosis is present. Aortic valve mean gradient measures 10.0 mmHg. Aortic valve peak gradient measures  19.9 mmHg. Aortic valve area, by VTI measures 1.81 cm. Pulmonic Valve: The pulmonic valve was grossly normal. Pulmonic valve regurgitation is trivial. Aorta: The aortic root is normal in size and structure. IAS/Shunts: No atrial level shunt detected by color flow Doppler. Additional Comments: Spectral Doppler performed. LEFT VENTRICLE PLAX 2D LVOT diam:     2.10 cm LV SV:         85 LV SV Index:   36 LVOT Area:     3.46 cm  AORTIC VALVE AV Area (Vmax):    1.83 cm AV Area (Vmean):   1.70 cm AV Area (VTI):     1.81 cm AV Vmax:           223.00 cm/s AV Vmean:          150.000 cm/s AV VTI:            0.468 m AV Peak Grad:      19.9 mmHg AV Mean Grad:      10.0 mmHg LVOT Vmax:         118.00 cm/s LVOT Vmean:  73.600 cm/s LVOT VTI:          0.245 m LVOT/AV VTI ratio: 0.52 AI PHT:            392 msec  AORTA Ao Root diam: 3.70 cm  SHUNTS Systemic VTI:  0.24 m Systemic Diam: 2.10 cm Oswaldo Milian MD Electronically signed by Oswaldo Milian MD Signature Date/Time: 05/31/2022/1:43:49 PM    Final    IR US Guide Vasc Access Right  Result Date: 05/30/2022 CLINICAL DATA:  End-stage renal disease and removal of previously placed right IJ tunneled hemodialysis catheter on 05/27/2022 due to bacteremia. The patient has now been satisfactory treated for bacteremia and requires a new tunneled hemodialysis catheter for continued hemodialysis needs. EXAM: TUNNELED CENTRAL VENOUS HEMODIALYSIS CATHETER PLACEMENT WITH ULTRASOUND AND FLUOROSCOPIC GUIDANCE ANESTHESIA/SEDATION: Moderate (conscious) sedation was employed during this procedure. A total of Versed 1.0 mg and Fentanyl 50 mcg was administered intravenously by radiology nursing. Moderate Sedation Time: 27 minutes. The patient's level of consciousness and vital signs were monitored  continuously by radiology nursing throughout the procedure under my direct supervision. MEDICATIONS: 2 g IV Ancef. FLUOROSCOPY: 5 minutes and 30 seconds.  130 mGy. PROCEDURE: The procedure, risks, benefits, and alternatives were explained to the patient. Questions regarding the procedure were encouraged and answered. The patient understands and consents to the procedure. A timeout was performed prior to initiating the procedure. A time-out was performed prior to initiating the procedure. The right neck and chest were prepped with chlorhexidine in a sterile fashion, and a sterile drape was applied covering the operative field. Maximum barrier sterile technique with sterile gowns and gloves were used for the procedure. Local anesthesia was provided with 1% lidocaine. Ultrasound was used to confirm patency of the right internal jugular vein. A permanent ultrasound image was saved and recorded. After creating a small venotomy incision, a 21 gauge needle was advanced into the right internal jugular vein under direct, real-time ultrasound guidance. Ultrasound image documentation was performed. After securing guidewire access, an 8 Fr dilator was placed. A J-wire was kinked to measure appropriate catheter length. A Palindrome tunneled hemodialysis catheter measuring 19 cm from tip to cuff was chosen for placement. This was tunneled in a retrograde fashion from the chest wall to the venotomy incision. At the venotomy, serial dilatation was performed and a 15 Fr peel-away sheath was placed over a guidewire. The catheter was then placed through the sheath and the sheath removed. Final catheter positioning was confirmed and documented with a fluoroscopic spot image. The catheter was aspirated, flushed with saline, and injected with appropriate volume heparin dwells. The venotomy incision was closed with subcuticular 4-0 Vicryl. Dermabond was applied to the incision. The catheter exit site was secured with 0-Prolene retention  sutures. COMPLICATIONS: None.  No pneumothorax. FINDINGS: After catheter placement, the tip lies in the right atrium. The catheter aspirates normally and is ready for immediate use. IMPRESSION: Placement of tunneled hemodialysis catheter via the right internal jugular vein. The catheter tip lies in the right atrium. The catheter is ready for immediate use. Electronically Signed   By: Aletta Edouard M.D.   On: 05/30/2022 13:25   IR Fluoro Guide CV Line Right  Result Date: 05/30/2022 CLINICAL DATA:  End-stage renal disease and removal of previously placed right IJ tunneled hemodialysis catheter on 05/27/2022 due to bacteremia. The patient has now been satisfactory treated for bacteremia and requires a new tunneled hemodialysis catheter for continued hemodialysis needs. EXAM: TUNNELED CENTRAL VENOUS HEMODIALYSIS CATHETER PLACEMENT WITH ULTRASOUND  AND FLUOROSCOPIC GUIDANCE ANESTHESIA/SEDATION: Moderate (conscious) sedation was employed during this procedure. A total of Versed 1.0 mg and Fentanyl 50 mcg was administered intravenously by radiology nursing. Moderate Sedation Time: 27 minutes. The patient's level of consciousness and vital signs were monitored continuously by radiology nursing throughout the procedure under my direct supervision. MEDICATIONS: 2 g IV Ancef. FLUOROSCOPY: 5 minutes and 30 seconds.  130 mGy. PROCEDURE: The procedure, risks, benefits, and alternatives were explained to the patient. Questions regarding the procedure were encouraged and answered. The patient understands and consents to the procedure. A timeout was performed prior to initiating the procedure. A time-out was performed prior to initiating the procedure. The right neck and chest were prepped with chlorhexidine in a sterile fashion, and a sterile drape was applied covering the operative field. Maximum barrier sterile technique with sterile gowns and gloves were used for the procedure. Local anesthesia was provided with 1%  lidocaine. Ultrasound was used to confirm patency of the right internal jugular vein. A permanent ultrasound image was saved and recorded. After creating a small venotomy incision, a 21 gauge needle was advanced into the right internal jugular vein under direct, real-time ultrasound guidance. Ultrasound image documentation was performed. After securing guidewire access, an 8 Fr dilator was placed. A J-wire was kinked to measure appropriate catheter length. A Palindrome tunneled hemodialysis catheter measuring 19 cm from tip to cuff was chosen for placement. This was tunneled in a retrograde fashion from the chest wall to the venotomy incision. At the venotomy, serial dilatation was performed and a 15 Fr peel-away sheath was placed over a guidewire. The catheter was then placed through the sheath and the sheath removed. Final catheter positioning was confirmed and documented with a fluoroscopic spot image. The catheter was aspirated, flushed with saline, and injected with appropriate volume heparin dwells. The venotomy incision was closed with subcuticular 4-0 Vicryl. Dermabond was applied to the incision. The catheter exit site was secured with 0-Prolene retention sutures. COMPLICATIONS: None.  No pneumothorax. FINDINGS: After catheter placement, the tip lies in the right atrium. The catheter aspirates normally and is ready for immediate use. IMPRESSION: Placement of tunneled hemodialysis catheter via the right internal jugular vein. The catheter tip lies in the right atrium. The catheter is ready for immediate use. Electronically Signed   By: Aletta Edouard M.D.   On: 05/30/2022 13:25      Signature  -   Lala Lund M.D on 06/02/2022 at 9:09 AM   -  To page go to [email or web address]

## 2022-06-02 NOTE — TOC Progression Note (Signed)
Transition of Care (TOC) - Initial/Assessment Note    Patient Details  Name: [name]. MRN: [id number] Date of Birth: 1955/04/08  Transition of Care [facility]) CM/SW Contact:    Milinda Antis, LCSWA Phone Number: 06/02/2022, 1:46 PM  Clinical Narrative:                 TOC following patient for d/c planning needs once medically stable.  Lind Covert, LCSW  Expected Discharge Plan: Skilled Nursing Facility Barriers to Discharge: Continued Medical Work up, SNF Pending bed offer   Patient Goals and CMS Choice   CMS Medicare.gov Compare Post Acute Care list provided to:: Patient Choice offered to / list presented to : Patient      Expected Discharge Plan and Services In-house Referral: Clinical Social Work     Living arrangements for the past 2 months: Apartment                                      Prior Living Arrangements/Services Living arrangements for the past 2 months: Apartment Lives with:: Self Patient language and need for interpreter reviewed:: Yes Do you feel safe going back to the place where you live?: Yes      Need for Family Participation in Patient Care: Yes (Comment) Care giver support system in place?: No (comment)   Criminal Activity/Legal Involvement Pertinent to Current Situation/Hospitalization: No - Comment as needed  Activities of Daily Living Home Assistive Devices/Equipment: Wheelchair ADL Screening (condition at time of admission) Patient's cognitive ability adequate to safely complete daily activities?: Yes Is the patient deaf or have difficulty hearing?: No Does the patient have difficulty seeing, even when wearing glasses/contacts?: No Does the patient have difficulty concentrating, remembering, or making decisions?: No Patient able to express need for assistance with ADLs?: Yes Does the patient have difficulty dressing or bathing?: Yes Independently performs ADLs?: No Communication: Independent Dressing (OT): Needs  assistance Is this a change from baseline?: Change from baseline, expected to last <3days Grooming: Independent Feeding: Independent Bathing: Needs assistance Is this a change from baseline?: Change from baseline, expected to last <3 days Toileting: Needs assistance Is this a change from baseline?: Change from baseline, expected to last <3 days In/Out Bed: Needs assistance Is this a change from baseline?: Change from baseline, expected to last <3 days Walks in Home: Needs assistance Is this a change from baseline?: Pre-admission baseline Does the patient have difficulty walking or climbing stairs?: Yes Weakness of Legs: Left Weakness of Arms/Hands: None  Permission Sought/Granted   Permission granted to share information with : Yes, Verbal Permission Granted     Permission granted to share info w AGENCY: SNFs        Emotional Assessment Appearance:: Appears stated age Attitude/Demeanor/Rapport: Guarded, Apprehensive Affect (typically observed): Adaptable Orientation: : Oriented to Situation, Oriented to  Time, Oriented to Place, Oriented to Self Alcohol / Substance Use: Not Applicable Psych Involvement: No (comment)  Admission diagnosis:  Bacteremia [R78.81] Patient Active Problem List   Diagnosis Date Noted   Class 1 obesity due to excess calories with body mass index (BMI) of 33.0 to 33.9 in adult 05/28/2022   Bacteremia 05/24/2022   Kidney lesion, native, left 05/24/2022   Gynecomastia 0000000   Complication associated with dialysis catheter 05/25/2021   COVID-19 virus infection 05/25/2021   ESRD (end stage renal disease) on dialysis ([place]) 02/18/2021   Anemia 09/15/2016   Abscess  09/09/2016   Abscess of buttock, right 09/08/2016   S/P bilateral BKA (below knee amputation) ([place]) 09/08/2016   Anemia due to chronic kidney disease 09/08/2016   Chronic combined systolic (congestive) and diastolic (congestive) heart failure ([place]) 06/20/2016   Acute hypoxemic  respiratory failure ([place]) 06/19/2016   Volume overload 06/19/2016   AKI (acute kidney injury) ([place]) 06/19/2016   DM (diabetes mellitus), type 2 with peripheral vascular complications ([place]) 0000000   SOB (shortness of breath)    ACHILLES BURSITIS OR TENDINITIS 09/14/2009   PLANTAR FACIITIS 09/14/2009   TIA 09/07/2009   Peripheral vascular disease ([place]) 01/07/2009   Hyperlipidemia 08/27/2008   Essential hypertension 08/27/2008   PCP:  [name], MD Pharmacy:   [facility], [place] - [address] AT [place]. [address] [place] [address] Phone: [phone] Fax: [phone]     Social Determinants of Health (SDOH) Social History: SDOH Screenings   Food Insecurity: Food Insecurity Present (05/25/2022)  Housing: Low Risk  (05/25/2022)  Transportation Needs: No Transportation Needs (05/25/2022)  Utilities: Not At Risk (05/25/2022)  Tobacco Use: Medium Risk (06/01/2022)   SDOH Interventions:     Readmission Risk Interventions     No data to display

## 2022-06-02 NOTE — Progress Notes (Signed)
Patient seen at bedside. 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   He did not want any surgical treatment. He understands that endocarditis can be a life-ending process, particularly in cases similar to this with root abscess and a severe valve lesion. He continues to refuse consideration of intervention. He understands that patients can become sicker and not candidates for surgery in this process.    I would also recommend removing the lead as this appears infected as well.   54M with bilateral leg amputations on dialysis for 6 years. He understands that he would be high risk for surgery. He would also not be able to use his arms to move around significantly for around 6 weeks. This is of particular concern with him given b/l BKAs.   His echo findings are significant for the following:  1. 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 Perforation of left coronary cusp aortic valve leaflet. The aortic  valve is tricuspid. Aortic valve regurgitation is moderate to severe. Mild  aortic stenosis.   3. Large vegetation measuring 1.5 cm x 1.5 cm appears attached to both  ICD lead and septal lealfet of tricuspid valve on atrial side. Mild  tricuspid regurgitation. There is another vegetation measuring 1.3 cm x  0.4 cm attached to ICD lead in right   Cardiothoracic surgery can be called to re-evaluate as needed.

## 2022-06-02 NOTE — Progress Notes (Addendum)
[facility] NEPHROLOGY PROGRESS NOTE  Assessment/ Plan: Pt is a 67 y.o. yo male   Dialysis Orders:  Center: [facility]  on MWF . EDW 116.5kg, Bath 2K/2.5Ca,  Time 4 hours ,Access RIJ TDC  BFR 400 DFR 500 UF profile 2    Meds: mircera 75 mcg every 2 weeks (due again on 2/26) Calcitriol 2.25 mcg three times a week Senispar 30 mg in center three times a week Heparin 2000 units IVP then 1000 units/hr.   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  Status post HD catheter removal by IR on 2/23 for line holiday.  On IV Ancef and repeat cultures are negative so far.  New catheter placed on 2/26.  No surgical intervention per CT surgery. Per Pharmacist: plan for Cefazolin 2g/HD-MWF thru 07/08/22    # ESRD on HD, MWF: New tunneled HD catheter placed on 05/30/2022 by IR.  Status post HD yesterday with 3.1 L ultrafiltration, tolerated well.  Plan for regular dialysis tomorrow.  # Anemia: Received Aranesp on 2/26, continue with.Monitor hemoglobin.  # Secondary hyperparathyroidism: Resume calcitriol, Sensipar and continue PhosLo for hyperphosphatemia.  Calcium and phosphorus level at goal.  # HTN/volume: Blood pressure variable.  Continue current antihypertensives and UF with HD.  # Hyponatremia, hypervolemic: Continue fluid restriction and ultrafiltration during HD.  Subjective: Seen and examined at bedside.  Tolerated dialysis well.  Denies nausea, vomiting, chest pain, shortness of breath. Objective Vital signs in last 24 hours: Vitals:   06/01/22 1549 06/01/22 2129 06/02/22 0409 06/02/22 0910  BP: (!) 93/47 (!) 107/94 (!) 110/95 (!) 122/50  Pulse: 98 97 97 85  Resp: '18 17 17 16  '$ Temp: 98.1 F (36.7 C) 99.4 F (37.4 C) 99.3 F (37.4 C) 99.3 F (37.4 C)  TempSrc: Oral Oral Oral Oral  SpO2: 100% 100% 100% 93%  Weight:      Height:       Weight change: 3.3 kg  Intake/Output Summary  (Last 24 hours) at 06/02/2022 1028 Last data filed at 06/02/2022 0813 Gross per 24 hour  Intake 8703 ml  Output 3100 ml  Net 5603 ml        Labs: RENAL PANEL Recent Labs    05/24/22 1725 05/25/22 0517 05/26/22 0513 05/27/22 0427 05/28/22 1151 05/29/22 0414 05/30/22 0047 05/31/22 0417 06/01/22 0254 06/02/22 0349  NA 131* 132* 132* 131* 131* 131* 128* 132* 132* 132*  K 4.0 4.1 3.8 3.7 4.5 3.8 4.1 3.9 4.2 3.9  CL 92* 95* 95* 95* 94* 93* 90* 93* 94* 94*  CO2 22 21* 23 23 21* '22 23 22 25 28  '$ GLUCOSE 140* 134* 156* 150* 210* 183* 178* 196* 149* 185*  BUN 86* 94* 56* 69* 45* 50* 57* 33* 42* 28*  CREATININE 9.50* 9.91* 6.44* 7.92* 5.99* 7.09* 8.03* 5.19* 6.52* 5.06*  CALCIUM 9.1 8.9 8.6* 9.0 8.9 9.3 9.5 9.2 9.2 9.1  MG 2.1  --   --   --   --   --   --   --   --   --   PHOS  --  6.4*  --   --   --  4.5 5.1* 3.4 4.7* 3.7  ALBUMIN 2.3* 2.1*  --   --   --  2.1* 2.1* 2.2* 2.1* 2.0*      Liver Function Tests: Recent Labs  Lab 05/31/22 0417 06/01/22 0254 06/02/22 0349  ALBUMIN 2.2* 2.1* 2.0*  No results for input(s): "LIPASE", "AMYLASE" in the last 168 hours. No results for input(s): "AMMONIA" in the last 168 hours. CBC: Recent Labs    05/25/22 0517 05/26/22 0513 05/29/22 0409 05/30/22 0047 05/30/22 0746 05/31/22 0417 06/01/22 0254 06/02/22 0349  HGB 7.1*   < > 8.0* 7.7*  --  8.7* 7.6* 7.2*  MCV 91.8   < > 93.4 93.1  --  92.6 93.6 94.1  VITAMINB12 268  --   --   --  288  --   --   --   FOLATE 25.3  --   --   --  34.5  --   --   --   FERRITIN 4,259*  --   --   --  3,315*  --   --   --   TIBC NOT CALCULATED  --   --   --  119*  --   --   --   IRON 42*  --   --   --  39*  --   --   --   RETICCTPCT 2.5  --   --   --  3.5*  --   --   --    < > = values in this interval not displayed.     Cardiac Enzymes: No results for input(s): "CKTOTAL", "CKMB", "CKMBINDEX", "TROPONINI" in the last 168 hours. CBG: Recent Labs  Lab 06/01/22 0710 06/01/22 1332 06/01/22 1549  06/01/22 2132 06/02/22 0721  GLUCAP 118* 127* 166* 129* 135*     Iron Studies:  No results for input(s): "IRON", "TIBC", "TRANSFERRIN", "FERRITIN" in the last 72 hours.  Studies/Results: ECHO TEE  Result Date: 05/31/2022    TRANSESOPHOGEAL ECHO REPORT   Patient Name:   Ramesses Southards. Date of Exam: 05/31/2022 Medical Rec #:  [id number]         Height:       72.0 in Accession #:    [id number]        Weight:       249.1 lb Date of Birth:  02-04-1956         BSA:          2.339 m Patient Age:    61 years          BP:           111/46 mmHg Patient Gender: M                 HR:           72 bpm. Exam Location:  Inpatient Procedure: 3D Echo, Color Doppler, Cardiac Doppler and Transesophageal Echo Indications:    Bacteremia  History:        Patient has prior history of Echocardiogram examinations, most                 recent 05/27/2022. Cardiomyopathy and CHF, Defibrillator, Stroke                 and TIA, Signs/Symptoms:Dyspnea; Risk Factors:Current Smoker,                 Hypertension, Dyslipidemia and Diabetes. CKD.  Sonographer:    Eartha Inch Referring Phys: [id number] [place]: After discussion of the risks and benefits of a TEE, an informed consent was obtained from the patient. The transesophogeal probe was passed without difficulty through the esophogus of the patient. Imaged were obtained with the patient in a left lateral decubitus position. Sedation performed by different  physician. The patient was monitored while under deep sedation. Anesthestetic sedation was provided intravenously by Anesthesiology: 211.'31mg'$  of Propofol. Image quality was good. The patient's vital signs; including heart rate, blood pressure, and oxygen saturation; remained stable throughout the procedure. The patient developed no complications during the procedure.  IMPRESSIONS  1. 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 Perforation of left coronary cusp  aortic valve leaflet. The aortic valve is tricuspid. Aortic valve regurgitation is moderate to severe. Mild aortic stenosis.  3. Large vegetation measuring 1.5 cm x 1.5 cm appears attached to both ICD lead and septal lealfet of tricuspid valve on atrial side. Mild tricuspid regurgitation. There is another vegetation measuring 1.3 cm x 0.4 cm attached to ICD lead in right atrium.  4. Left ventricular ejection fraction, by estimation, is 45 to 50%. The left ventricle has mildly decreased function.  5. Right ventricular systolic function is normal. The right ventricular size is mildly enlarged  6. The mitral valve is degenerative. Trivial mitral valve regurgitation.  7. No left atrial/left atrial appendage thrombus was detected. FINDINGS  Left Ventricle: Left ventricular ejection fraction, by estimation, is 45 to 50%. The left ventricle has mildly decreased function. The left ventricular internal cavity size was normal in size. Right Ventricle: The right ventricular size is mildly enlarged. No increase in right ventricular wall thickness. Right ventricular systolic function is normal. Left Atrium: Left atrial size was normal in size. No left atrial/left atrial appendage thrombus was detected. Right Atrium: Right atrial size was normal in size. Pericardium: There is no evidence of pericardial effusion. Mitral Valve: The mitral valve is degenerative in appearance. Trivial mitral valve regurgitation. Tricuspid Valve: The tricuspid valve is abnormal. Tricuspid valve regurgitation is mild. Aortic Valve: The aortic valve is tricuspid. Aortic valve regurgitation is moderate to severe. Aortic regurgitation PHT measures 392 msec. Mild aortic stenosis is present. Aortic valve mean gradient measures 10.0 mmHg. Aortic valve peak gradient measures  19.9 mmHg. Aortic valve area, by VTI measures 1.81 cm. Pulmonic Valve: The pulmonic valve was grossly normal. Pulmonic valve regurgitation is trivial. Aorta: The aortic root is normal  in size and structure. IAS/Shunts: No atrial level shunt detected by color flow Doppler. Additional Comments: Spectral Doppler performed. LEFT VENTRICLE PLAX 2D LVOT diam:     2.10 cm LV SV:         85 LV SV Index:   36 LVOT Area:     3.46 cm  AORTIC VALVE AV Area (Vmax):    1.83 cm AV Area (Vmean):   1.70 cm AV Area (VTI):     1.81 cm AV Vmax:           223.00 cm/s AV Vmean:          150.000 cm/s AV VTI:            0.468 m AV Peak Grad:      19.9 mmHg AV Mean Grad:      10.0 mmHg LVOT Vmax:         118.00 cm/s LVOT Vmean:        73.600 cm/s LVOT VTI:          0.245 m LVOT/AV VTI ratio: 0.52 AI PHT:            392 msec  AORTA Ao Root diam: 3.70 cm  SHUNTS Systemic VTI:  0.24 m Systemic Diam: 2.10 cm Oswaldo Milian MD Electronically signed by Oswaldo Milian MD Signature Date/Time: 05/31/2022/1:43:49  PM    Final     Medications: Infusions:  albumin human 25 g (05/27/22 0930)   [START ON 06/03/2022]  ceFAZolin (ANCEF) IV      Scheduled Medications:  amLODipine  10 mg Oral QHS   aspirin  81 mg Oral QHS   brimonidine  1 drop Right Eye TID   calcitRIOL  2.25 mcg Oral Q M,W,F   calcium acetate  1,334 mg Oral TID WC   carvedilol  12.5 mg Oral Q M,W,F-2000   carvedilol  12.5 mg Oral 2 times per day on Sun Tue Thu Sat   Chlorhexidine Gluconate Cloth  6 each Topical Daily   Chlorhexidine Gluconate Cloth  6 each Topical Q0600   Chlorhexidine Gluconate Cloth  6 each Topical Q0600   cinacalcet  30 mg Oral Q M,W,F   cloNIDine  0.1 mg Oral QHS   clopidogrel  75 mg Oral QHS   [START ON 06/03/2022] vitamin B-12  1,000 mcg Oral Daily   darbepoetin (ARANESP) injection - NON-DIALYSIS  150 mcg Subcutaneous Q [id number]   folic acid  1 mg Oral QHS   gabapentin  300 mg Oral QHS   heparin  5,000 Units Subcutaneous Q8H   hydrALAZINE  25 mg Oral 2 times per day on Sun Tue Thu Sat   hydrALAZINE  25 mg Oral Q M,W,F-2000   insulin aspart  0-6 Units Subcutaneous TID WC   insulin aspart  3 Units Subcutaneous  TID WC   insulin glargine-yfgn  10 Units Subcutaneous QHS   pantoprazole  40 mg Oral BID   rosuvastatin  20 mg Oral QHS   sodium chloride flush  10-40 mL Intracatheter Q12H   sodium chloride flush  3 mL Intravenous Q12H    have reviewed scheduled and prn medications.  Physical Exam: General:NAD, comfortable Heart:RRR, s1s2 nl Lungs:clear b/l, no crackle Abdomen:soft, Non-tender, non-distended Extremities: Bilateral BKA, trace stump edema Dialysis Access: Right IJ TDC in place, site clean.  No bleeding.  Alailah Safley Prasad Darielle Hancher 06/02/2022,10:28 AM  LOS: 8 days

## 2022-06-02 NOTE — Progress Notes (Addendum)
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 To discharging provider:  please sign these orders via discharge navigator,  Select New Orders & click on the button choice - Manage This Unsigned Work.     Thank you for allowing pharmacy to be a part of this patient's care.  Alycia Rossetti, PharmD, BCPS Infectious Diseases Clinical Pharmacist 06/03/2022 1:21 PM   **Pharmacist phone directory can now be found on [email or web address] ([email or web address]).  Listed under [place].

## 2022-06-03 DIAGNOSIS — R7881 Bacteremia: Secondary | ICD-10-CM | POA: Diagnosis not present

## 2022-06-03 LAB — CBC WITH DIFFERENTIAL/PLATELET
Abs Immature Granulocytes: 0.11 10*3/uL — ABNORMAL HIGH (ref 0.00–0.07)
Basophils Absolute: 0 10*3/uL (ref 0.0–0.1)
Basophils Relative: 0 %
Eosinophils Absolute: 0.2 10*3/uL (ref 0.0–0.5)
Eosinophils Relative: 2 %
HCT: 22.1 % — ABNORMAL LOW (ref 39.0–52.0)
Hemoglobin: 7.1 g/dL — ABNORMAL LOW (ref 13.0–17.0)
Immature Granulocytes: 1 %
Lymphocytes Relative: 10 %
Lymphs Abs: 1.3 10*3/uL (ref 0.7–4.0)
MCH: 30.5 pg (ref 26.0–34.0)
MCHC: 32.1 g/dL (ref 30.0–36.0)
MCV: 94.8 fL (ref 80.0–100.0)
Monocytes Absolute: 0.9 10*3/uL (ref 0.1–1.0)
Monocytes Relative: 7 %
Neutro Abs: 10.7 10*3/uL — ABNORMAL HIGH (ref 1.7–7.7)
Neutrophils Relative %: 80 %
Platelets: 176 10*3/uL (ref 150–400)
RBC: 2.33 MIL/uL — ABNORMAL LOW (ref 4.22–5.81)
RDW: 16 % — ABNORMAL HIGH (ref 11.5–15.5)
WBC: 13.3 10*3/uL — ABNORMAL HIGH (ref 4.0–10.5)
nRBC: 0 % (ref 0.0–0.2)

## 2022-06-03 LAB — GLUCOSE, CAPILLARY
Glucose-Capillary: 140 mg/dL — ABNORMAL HIGH (ref 70–99)
Glucose-Capillary: 173 mg/dL — ABNORMAL HIGH (ref 70–99)
Glucose-Capillary: 178 mg/dL — ABNORMAL HIGH (ref 70–99)

## 2022-06-03 LAB — RENAL FUNCTION PANEL
Albumin: 2.1 g/dL — ABNORMAL LOW (ref 3.5–5.0)
Anion gap: 13 (ref 5–15)
BUN: 41 mg/dL — ABNORMAL HIGH (ref 8–23)
CO2: 24 mmol/L (ref 22–32)
Calcium: 9.3 mg/dL (ref 8.9–10.3)
Chloride: 93 mmol/L — ABNORMAL LOW (ref 98–111)
Creatinine, Ser: 6.84 mg/dL — ABNORMAL HIGH (ref 0.61–1.24)
GFR, Estimated: 8 mL/min — ABNORMAL LOW (ref >60)
Glucose, Bld: 154 mg/dL — ABNORMAL HIGH (ref 70–99)
Phosphorus: 4.3 mg/dL (ref 2.5–4.6)
Potassium: 4.2 mmol/L (ref 3.5–5.1)
Sodium: 130 mmol/L — ABNORMAL LOW (ref 135–145)

## 2022-06-03 MED ORDER — ANTICOAGULANT SODIUM CITRATE 4% (200MG/5ML) IV SOLN
5.0000 mL | Status: DC | PRN
  Filled 2022-06-03: qty 5

## 2022-06-03 MED ORDER — ALTEPLASE 2 MG IJ SOLR: 2.0000 mg | Freq: Once | INTRAMUSCULAR | Status: DC | PRN

## 2022-06-03 MED ORDER — PENTAFLUOROPROP-TETRAFLUOROETH EX AERO: 1.0000 | INHALATION_SPRAY | CUTANEOUS | Status: DC | PRN

## 2022-06-03 MED ORDER — RIFAMPIN 300 MG PO CAPS
300.0000 mg | ORAL_CAPSULE | Freq: Two times a day (BID) | ORAL | Status: DC
  Administered 2022-06-03 – 2022-06-07 (×9): 300 mg via ORAL
  Filled 2022-06-03 (×9): qty 1

## 2022-06-03 MED ORDER — HEPARIN SODIUM (PORCINE) 1000 UNIT/ML DIALYSIS
1000.0000 [IU] | INTRAMUSCULAR | Status: DC | PRN
  Administered 2022-06-03: 3200 [IU]

## 2022-06-03 MED ORDER — LIDOCAINE-PRILOCAINE 2.5-2.5 % EX CREA: 1.0000 | TOPICAL_CREAM | CUTANEOUS | Status: DC | PRN

## 2022-06-03 MED ORDER — FERROUS SULFATE 325 (65 FE) MG PO TABS
325.0000 mg | ORAL_TABLET | Freq: Three times a day (TID) | ORAL | Status: DC
  Administered 2022-06-03 – 2022-06-07 (×10): 325 mg via ORAL
  Filled 2022-06-03 (×11): qty 1

## 2022-06-03 MED ORDER — HEPARIN SODIUM (PORCINE) 1000 UNIT/ML DIALYSIS: 20.0000 [IU]/kg | INTRAMUSCULAR | Status: DC | PRN

## 2022-06-03 MED ORDER — LIDOCAINE HCL (PF) 1 % IJ SOLN: 5.0000 mL | INTRAMUSCULAR | Status: DC | PRN

## 2022-06-03 MED ORDER — OXYCODONE HCL 5 MG PO TABS
5.0000 mg | ORAL_TABLET | Freq: Four times a day (QID) | ORAL | Status: DC | PRN
  Administered 2022-06-04 – 2022-06-05 (×3): 5 mg via ORAL
  Filled 2022-06-03 (×4): qty 1

## 2022-06-03 NOTE — Progress Notes (Signed)
Received patient in bed to unit.  Alert and oriented.  Informed consent signed and in chart.   [place]oyd duration:4   Patient tolerated well.  Transported back to the room  Alert, without acute distress.  Hand-off given to patient's nurse.   Access used: Catheter  Access issues: n/a  Total UF removed: 3.4L Medication(s) given: Tylenol, Albumin ,100 ml saline bolus  Post HD weight: 113.4KG    06/03/22 1452  Vitals  Temp 98 F (36.7 C)  Temp Source Oral  BP (!) 120/55  MAP (mmHg) 75  Pulse Rate 79  ECG Heart Rate 79  Resp (!) 9  Oxygen Therapy  SpO2 93 %  O2 Device Room Air  During Treatment Monitoring  Intra-Hemodialysis Comments Tx completed;Tolerated well  Post Treatment  Dialyzer Clearance Lightly streaked  Duration of HD Treatment -hour(s) 4 hour(s)  Hemodialysis Intake (mL) 100 mL  Liters Processed 96  Fluid Removed (mL) 3400 mL  Tolerated HD Treatment Yes  Hemodialysis Catheter Right Internal jugular Double lumen Permanent (Tunneled)  Placement Date/Time: 05/30/22 1219   Serial / Lot #: [id number]  Expiration Date: 02/08/27  Time Out: Correct patient;Correct site;Correct procedure  Maximum sterile barrier precautions: Hand hygiene;Cap;Mask;Sterile gown;Sterile gloves;Large sterile ...  Site Condition No complications  Blue Lumen Status Heparin locked  Red Lumen Status Heparin locked  Catheter fill volume (Arterial) 1.6 cc  Catheter fill volume (Venous) 1.6  Dressing Type Transparent  Dressing Status Antimicrobial disc in place  Drainage Description None  Dressing Change Due 06/08/22  Post treatment catheter status Capped and [facility]

## 2022-06-03 NOTE — Progress Notes (Signed)
Pharmacy Antibiotic Note  [name]. is a 67 y.o. male admitted on 05/24/2022 with bacteremia. Also found to have large vegetation on tricuspid valve/ICD lead, small vegetation on right atrium ICD, and aortic root abscess. Pharmacy consulted for cefazolin dosing.  Plan: Continue cefazolin 2g IV qHD session (MWF)  Height: 6' (182.9 cm) Weight: 116.3 kg (256 lb 6.3 oz) IBW/kg (Calculated) : 77.6  Temp (24hrs), Avg:99.4 F (37.4 C), Min:98.1 F (36.7 C), Max:100.4 F (38 C)  Recent Labs  Lab 05/29/22 0409 05/29/22 0414 05/30/22 0047 05/31/22 0417 06/01/22 0254 06/02/22 0349  WBC 17.6*  --  14.8* 21.0* 13.3* 12.1*  CREATININE  --  7.09* 8.03* 5.19* 6.52* 5.06*     Estimated Creatinine Clearance: 18.1 mL/min (A) (by C-G formula based on SCr of 5.06 mg/dL (H)).    Allergies  Allergen Reactions   Contrast Media [Iodinated Contrast Media] Hives and Other (See Comments)    Happened "in the 80's"   Other Other (See Comments)    "Transfer Dye" = "Makes me tired"   Acetazolamide Anxiety and Other (See Comments)    Jittery, odd feeling (hyper feeling)    Antimicrobials this admission: Vanco 2/20 >>2/21 Cefepime 2/20 >>2/21 Flagyl 2/20 >>2/21 Cefazolin 2/21>>  Microbiology results: 2/23 cath tip cx: ngtd 2/21  BCx: ngtd 2/21 BCx: ngtd  2/21 MRSA PCR: neg 2/20 Bcx: negative 2/15 Bcx from [place]- staph lugdunensis , sensitive to cefazolin, cipro, clindamycin, and vancomycin    Dimple Nanas, PharmD, BCPS 06/03/2022 7:32 AM

## 2022-06-03 NOTE — Progress Notes (Signed)
Occupational Therapy Treatment Patient Details Name: [name]. MRN: [id number] DOB: 1955/05/08 Today's Date: 06/03/2022   History of present illness 67 yo male presenting to [facility] ED 2/20 from dialysis with possible infection. Diagnosised with Staph Lugdunensis bacteremia. Catheter removed on 2/23 for a line holiday. PMH bil BKA, HTN, insulin-dependent diabetes mellitus, PAD, anemia, and ESRD on hemodialysis.   OT comments  Pt with R side neck pain. Performed soft tissue massage and applied warm pack. Pt completed UB bathing with min assist, UB dressing with set up. Washed face and brushed teeth with set up. Session limited by arrival of transporter to take pt to HD.   Recommendations for follow up therapy are one component of a multi-disciplinary discharge planning process, led by the attending physician.  Recommendations may be updated based on patient status, additional functional criteria and insurance authorization.    Follow Up Recommendations  Skilled nursing-short term rehab (<3 hours/day)     Assistance Recommended at Discharge Frequent or constant Supervision/Assistance  Patient can return home with the following  A lot of help with bathing/dressing/bathroom;Assistance with cooking/housework;Direct supervision/assist for medications management;Direct supervision/assist for financial management;Assist for transportation;Help with stairs or ramp for entrance;A lot of help with walking and/or transfers   Equipment Recommendations  Other (comment) (defer to next venue)    Recommendations for Other Services      Precautions / Restrictions Precautions Precautions: Fall Restrictions Weight Bearing Restrictions: No       Mobility Bed Mobility Overal bed mobility: Modified Independent                  Transfers                         Balance     Sitting balance-Leahy Scale: Poor Sitting balance - Comments: leans on L elbow                                    ADL either performed or assessed with clinical judgement   ADL Overall ADL's : Needs assistance/impaired     Grooming: Wash/dry hands;Wash/dry face;Oral care;Sitting;Set up   Upper Body Bathing: Minimal assistance;Sitting       Upper Body Dressing : Set up;Sitting                     General ADL Comments: Pt tends to lean on L elbow when seated at EOB. Performed soft tissue massage to neck and applied warm pack.    Extremity/Trunk Assessment              Vision   Additional Comments: R side vision deficits, decrease acuity   Perception     Praxis      Cognition Arousal/Alertness: Awake/alert Behavior During Therapy: Flat affect Overall Cognitive Status: Impaired/Different from baseline                                 General Comments: Pt does not appear to appreciate the gravity of his medical situation.        Exercises      Shoulder Instructions       General Comments      Pertinent Vitals/ Pain       Pain Assessment Pain Assessment: Faces Faces Pain Scale: Hurts even more Pain Location: neck Pain Descriptors /  Indicators: Aching, Grimacing Pain Intervention(s): Repositioned, Heat applied (soft tissue massage)  Home Living                                          Prior Functioning/Environment              Frequency  Min 2X/week        Progress Toward Goals  OT Goals(current goals can now be found in the care plan section)  Progress towards OT goals: Progressing toward goals  Acute Rehab OT Goals OT Goal Formulation: With patient Time For Goal Achievement: 06/11/22 Potential to Achieve Goals: Good  Plan Discharge plan remains appropriate    Co-evaluation                 AM-PAC OT "6 Clicks" Daily Activity     Outcome Measure   Help from another person eating meals?: None Help from another person taking care of personal grooming?: A Little Help from  another person toileting, which includes using toliet, bedpan, or urinal?: A Lot Help from another person bathing (including washing, rinsing, drying)?: A Lot Help from another person to put on and taking off regular upper body clothing?: A Little Help from another person to put on and taking off regular lower body clothing?: A Lot 6 Click Score: 16    End of Session    OT Visit Diagnosis: Muscle weakness (generalized) (M62.81)   Activity Tolerance Patient tolerated treatment well   Patient Left in bed (going to HD)   Nurse Communication          Time: [id number] OT Time Calculation (min): 16 min  Charges: OT General Charges $OT Visit: 1 Visit OT Treatments $Self Care/Home Management : 8-22 mins  Cleta Alberts, OTR/L Acute Rehabilitation Services Office: [phone]   [name] 06/03/2022, 10:08 AM

## 2022-06-03 NOTE — Progress Notes (Signed)
PT Cancellation Note  Patient Details Name: [name]. MRN: [id number] DOB: 10/04/1955   Cancelled Treatment:    Reason Eval/Treat Not Completed: (P) Patient at procedure or test/unavailable, pt off unit at HD, Will check back as schedule allows to continue with PT POC.  Audry Riles. PTA Acute Rehabilitation Services Office: [place] 06/03/2022, 12:52 PM

## 2022-06-03 NOTE — Progress Notes (Signed)
[name] for Infectious Disease  Date of Admission:  05/24/2022     Principal Problem:   Bacteremia Active Problems:   Hyperlipidemia   Essential hypertension   DM (diabetes mellitus), type 2 with peripheral vascular complications (HCC)   Anemia   ESRD (end stage renal disease) on dialysis [facility])   Kidney lesion, native, left   Gynecomastia   Class 1 obesity due to excess calories with body mass index (BMI) of 33.0 to 33.9 in adult          Assessment: 67 YM transferred from [facility] for EP eval. Admitted with staph lugdenensis bacteremia,  #Staph Lugdenensis  bacteremia #ESRD on HD via tunneled cath # Aortic root abscess, ICD vegetations -EP and nephrology on board -Blood Cx form 2/20 and 2/21 NG -TEE 2/27, showed thickening of aortic root concerning for aortic root abscess, perforation left coronary cusp aortic valve leaflet, lrge vegetation measuring 1.5cm x1.5cm on  both ICD lead and septal leaflet, 1.3cm x 0.4cm right ICD lead vegetation.  -CTS consulted and noted pt did not want surgical intervention, recommend lead removal. Noted he would be high risk of surgery. -EP noted pt not a candidate for lead extraction , recommended palliate consult Recommendations: -Continue cefazolin with HD 6 weeks for [facility] removal on 05/27/22(EOT 07/08/22) -Will add rifampin 300 mg PO bid for 6 weeks given there are no plans for lead removal in the  setting of lead veg/aortic root abscess. Unclear how beneficial rifmapin will be given budren of disease. If he is unable to tolerate rif then ok to peel off -Right chest HD cath removed by IR on 2/23, 2/26 new IF HD cath placed  ID will sign off  ,OPAT ORDERS:  Diagnosis: staph lugdenensis IE  Allergies  Allergen Reactions   Contrast Media [Iodinated Contrast Media] Hives and Other (See Comments)    Happened "in the 80's"   Other Other (See Comments)    "Transfer Dye" = "Makes me tired"   Acetazolamide Anxiety and Other  (See Comments)    Jittery, odd feeling (hyper feeling)     Discharge antibiotics to be given via PICC line:  Per pharmacy protocol cefazolin 2gm HD MWF Aim for Vancomycin trough 15-20 or AUC 400-550 (unless otherwise indicated)   Duration: 6/ weeks End Date: 07/08/22  [facility] Care Per Protocol with [employer]op[employer] Use: Home health RN for IV administration and teaching, line care and labs.    Labs weekly while on IV antibiotics: _x_ CBC with differential __ BMP **TWICE WEEKLY ON VANCOMYCIN  __x CMP __x CRP __ ESR __ Vancomycin trough TWICE WEEKLY __ CK  x__ Please pull PIC at completion of IV antibiotics __ Please leave PIC in place until doctor has seen patient or been notified  Fax weekly labs to [phone]  Clinic Follow Up Appt: 3/18  @ [facility] with Dr. Candiss Norse    Microbiology:   Antibiotics: Cefazolin 2/21- Cultures: Blood 2/20ng 2/21 ng   SUBJECTIVE: Resting in bed. NO new complaints Interval:  Tmax 1000.36, wbc 13k.  Review of Systems: Review of Systems  All other systems reviewed and are negative.    Scheduled Meds:  amLODipine  10 mg Oral QHS   aspirin  81 mg Oral QHS   brimonidine  1 drop Right Eye TID   calcitRIOL  2.25 mcg Oral Q M,W,F   calcium acetate  1,334 mg Oral TID WC   carvedilol  12.5 mg Oral Q M,W,F-2000  carvedilol  12.5 mg Oral 2 times per day on Sun Tue Thu Sat   Chlorhexidine Gluconate Cloth  6 each Topical Daily   Chlorhexidine Gluconate Cloth  6 each Topical Q0600   Chlorhexidine Gluconate Cloth  6 each Topical Q0600   Chlorhexidine Gluconate Cloth  6 each Topical Q0600   cinacalcet  30 mg Oral Q M,W,F   cloNIDine  0.1 mg Oral QHS   clopidogrel  75 mg Oral QHS   vitamin B-12  1,000 mcg Oral Daily   darbepoetin (ARANESP) injection - NON-DIALYSIS  150 mcg Subcutaneous Q Mon-1800   ferrous sulfate  325 mg Oral TID WC   folic acid  1 mg Oral QHS   gabapentin  300 mg Oral QHS   heparin  5,000 Units Subcutaneous Q8H   hydrALAZINE   25 mg Oral 2 times per day on Sun Tue Thu Sat   hydrALAZINE  25 mg Oral Q M,W,F-2000   insulin aspart  0-6 Units Subcutaneous TID WC   insulin aspart  3 Units Subcutaneous TID WC   insulin glargine-yfgn  10 Units Subcutaneous QHS   pantoprazole  40 mg Oral BID   rifampin  300 mg Oral Q12H   rosuvastatin  20 mg Oral QHS   sodium chloride flush  10-40 mL Intracatheter Q12H   sodium chloride flush  3 mL Intravenous Q12H   Continuous Infusions:   ceFAZolin (ANCEF) IV     PRN Meds:.acetaminophen **OR** acetaminophen, hydrALAZINE, ondansetron **OR** ondansetron (ZOFRAN) IV, oxyCODONE Allergies  Allergen Reactions   Contrast Media [Iodinated Contrast Media] Hives and Other (See Comments)    Happened "in the 80's"   Other Other (See Comments)    "Transfer Dye" = "Makes me tired"   Acetazolamide Anxiety and Other (See Comments)    Jittery, odd feeling (hyper feeling)    OBJECTIVE: Vitals:   06/03/22 1340 06/03/22 1410 06/03/22 1452 06/03/22 1515  BP: (!) 139/108 117/72 (!) 120/55   Pulse: 79 76 79   Resp: 18 13 (!) 9   Temp:   98 F (36.7 C)   TempSrc:   Oral   SpO2: 99% 95% 93%   Weight:    113.4 kg  Height:       Body mass index is 33.91 kg/m.  Physical Exam Constitutional:      General: He is not in acute distress.    Appearance: He is normal weight. He is not toxic-appearing.  HENT:     Head: Normocephalic and atraumatic.     Right Ear: External ear normal.     Left Ear: External ear normal.     Nose: No congestion or rhinorrhea.     Mouth/Throat:     Mouth: Mucous membranes are moist.     Pharynx: Oropharynx is clear.  Eyes:     Extraocular Movements: Extraocular movements intact.     Conjunctiva/sclera: Conjunctivae normal.     Pupils: Pupils are equal, round, and reactive to light.  Cardiovascular:     Rate and Rhythm: Normal rate and regular rhythm.     Heart sounds: No murmur heard.    No friction rub. No gallop.  Pulmonary:     Effort: Pulmonary effort  is normal.     Breath sounds: Normal breath sounds.  Chest:     Comments: Chest wound C/D?I  No tenderness at PPM Abdominal:     General: Abdomen is flat. Bowel sounds are normal.     Palpations: Abdomen is soft.  Musculoskeletal:  General: No swelling. Normal range of motion.     Cervical back: Normal range of motion and neck supple.  Skin:    General: Skin is warm and dry.  Neurological:     General: No focal deficit present.     Mental Status: He is oriented to person, place, and time.  Psychiatric:        Mood and Affect: Mood normal.       Lab Results Lab Results  Component Value Date   WBC 13.3 (H) 06/03/2022   HGB 7.1 (L) 06/03/2022   HCT 22.1 (L) 06/03/2022   MCV 94.8 06/03/2022   PLT 176 06/03/2022    Lab Results  Component Value Date   CREATININE 6.84 (H) 06/03/2022   BUN 41 (H) 06/03/2022   NA 130 (L) 06/03/2022   K 4.2 06/03/2022   CL 93 (L) 06/03/2022   CO2 24 06/03/2022    Lab Results  Component Value Date   ALT 24 05/25/2022   AST 33 05/25/2022   ALKPHOS 66 05/25/2022   BILITOT 1.3 (H) 05/25/2022        Laurice Record, MD [place] [facility] 06/03/2022, 4:05 PM

## 2022-06-03 NOTE — Progress Notes (Signed)
Patient's blood pressure dropped. UF turned off. Albumin administered per provider Carolin Sicks verbal order. Patient only symptom is "dizziness"       06/03/22 1210  Vitals  BP (!) 83/73  MAP (mmHg) 78  Pulse Rate 81  ECG Heart Rate 84  Resp 18  Oxygen Therapy  SpO2 100 %  During Treatment Monitoring  TMP (mmHg) 0 mmHg

## 2022-06-03 NOTE — Procedures (Addendum)
Patient was seen on dialysis and the procedure was supervised.  BFR 400  Via TDC BP is  122/44.   Patient appears to be tolerating treatment well. Lower temp to 36 for IDH, d/w RN  Per Pharmacist: plan for Cefazolin 2g/HD-MWF thru 07/08/22   [name] 06/03/2022

## 2022-06-03 NOTE — Progress Notes (Signed)
PROGRESS NOTE                                                                                                                                                                                                             Patient Demographics:    [name], is a 67 y.o. male, DOB - [birth year], [id number]  Outpatient Primary MD for the patient is [name], MD    LOS - 9  Admit date - 05/24/2022    Chief Complaint  Patient presents with   Shortness of Breath       Brief Narrative (HPI from H&P)   53 yom w/ hypertension,IDDM,PAD,anemia, pacemaker, and ESRD on HD, sent from dialysis for evaluation of bacteremia.   Patient severely fatigued for the past 1 week, missed dialysis 2/20 for this reason, and was contacted by dialysis personnel, informed that he had an infection in his blood, and was directed to the ED. No fever, chills, cough, dysuria, abdominal pain, vomiting, diarrhea, neck stiffness, or new rash. In [id number] and saturating mid 90s on room air. CT C/A/P notable for mild skin thickening and edema of the abdominal wall without gas or abscess, left renal lesion, right greater than left gynecomastia, and other nonacute findings.    He was admitted to the hospital for bacteremia in the presence of dialysis tunneled catheter, AICD, seen by nephrology, ID and EP.  Currently on IV antibiotics awaiting further workup.  He was transferred under my care on 05/30/2022 on day 5 of hospital stay.   Subjective:   Active   Assessment  & Plan :     Bacteremia with HD center blood cx growing [place] with history of HD catheter and AICD device, TEE suspicious for aortic root abscess, AICD vegetation, tricuspid valve endocarditis -  Sent by HD to the ED due to bacteremia from blood cultures on 2/15, underwent echocardiogram showing -   He is on IV antibiotics per ID, underwent tunneled catheter removal by IR on 05/27/2022,  new tunneled right IJ dialysis catheter was placed on 05/30/2022 after line holidays.  On IV Ancef, repeat cultures thus far negative appears nontoxic, TEE as above. Cardiology/EP and cardiothoracic surgery both consulted.  Per EP not a candidate for AICD removal due to complex anatomy and underlying medical issues, per cardiothoracic surgery not a surgical candidate.  Continue  medical treatment.  Long-term prognosis is guarded.   Class 1 obesity due to excess calories with body mass index (BMI) of 33.0 to 33.9 in adult -Body mass index is 33.79 kg/m..  -Weight loss should be encouraged -Outpatient PCP/bariatric medicine f/u encouraged   Gynecomastia -Right more than left  -Incidentally noted on  CT  -Outpatient follow-up and consideration for mammogram is recommended    Kidney lesion, native, left -Incidentally noted on CT -Outpatient MRI recommended   ESRD (end stage renal disease) on dialysis ([place]) -Patient on chronic MWF HD -Nephrology prn order set utilized -Due for HD again tomorrow post-catheter placement  Anemia -Associated with ESRD -However H&H serially dropping, no Javonni blood in stool, pending Hemoccult stool  stable DIC panel, LDH , Haptoglobin, placed on PPI twice daily and monitor H&H closely.  Type screen done, no signs of brisk ongoing bleeding.  DM (diabetes mellitus), type 2 with peripheral vascular complications (HCC) -123456 5.6, good control -Continue glargine -Cover with very sensitive-scale SSI   CBG (last 3)  Recent Labs    06/02/22 1602 06/02/22 2100 06/03/22 0744  GLUCAP 197* 173* 140*     Essential hypertension -Continue amlodipine, carvedilol, clonidine, hydralazine  Hyperlipidemia -Continue rosuvastatin -Hold Vascepa      Condition - Extremely Guarded  Family Communication  : Called brother [name] on 05/31/2022 at 7:40 AM and message left [phone]   Code Status :  Full  Consults  :  ID, EP, Renal, IR  PUD Prophylaxis : PPI    Procedures  :     TEE - 1. 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 Perforation of left coronary cusp aortic valve leaflet. The aortic valve is tricuspid. Aortic valve regurgitation is moderate to severe. Mild aortic stenosis.  3. Large vegetation measuring 1.5 cm x 1.5 cm appears attached to both ICD lead and septal lealfet of tricuspid valve on atrial side. Mild tricuspid regurgitation. There is another vegetation measuring 1.3 cm x 0.4 cm attached to ICD lead in right atrium.  4. Left ventricular ejection fraction, by estimation, is 45 to 50%. The left ventricle has mildly decreased function.  5. Right ventricular systolic function is normal. The right ventricular size is mildly enlarged  6. The mitral valve is degenerative. Trivial mitral valve regurgitation.  7. No left atrial/left atrial appendage thrombus was detected.   TTE - 1. Left ventricular ejection fraction, by estimation, is 45 to 50%. The left ventricle has mildly decreased function. The left ventricle demonstrates regional wall motion abnormalities (see scoring diagram/findings for description). There is moderate concentric left ventricular hypertrophy. Left ventricular diastolic parameters are consistent with Grade I diastolic dysfunction (impaired relaxation).  2. Right ventricular systolic function is normal. The right ventricular size is normal. There is normal pulmonary artery systolic pressure.  3. The mitral valve is degenerative. Trivial mitral valve regurgitation.  4. The aortic valve is tricuspid. There is mild calcification of the aortic valve. There is mild thickening of the aortic valve. Aortic valve regurgitation is not visualized. Mild aortic valve stenosis. Aortic valve mean gradient measures 11.0 mmHg.  5. Aortic dilatation noted. There is mild dilatation of the aortic root, measuring 41 mm.  6. The inferior vena cava is normal in size with greater than 50% respiratory variability,  suggesting right atrial pressure of 3 mmHg.  7. Device lead present in RA/RV with thickening and echodensity (approximately 1cm x 2cm) noted on atrial side of tricuspid valve concerning for vegetation  in the setting of known bacteremia. TEE indicated for further evaluation as lead extraction may need to be considered. Comparison(s): Prior images unable to be directly viewed.       Disposition Plan  :    Status is: Inpatient  DVT Prophylaxis  :    heparin injection 5,000 Units Start: 05/25/22 0600   Lab Results  Component Value Date   PLT 141 (L) 06/02/2022    Diet :  Diet Order             Diet renal/carb modified with fluid restriction Diet-HS Snack? Nothing; Fluid restriction: 1200 mL Fluid; Room service appropriate? Yes; Fluid consistency: Thin  Diet effective now                    Inpatient Medications  Scheduled Meds:  amLODipine  10 mg Oral QHS   aspirin  81 mg Oral QHS   brimonidine  1 drop Right Eye TID   calcitRIOL  2.25 mcg Oral Q M,W,F   calcium acetate  1,334 mg Oral TID WC   carvedilol  12.5 mg Oral Q M,W,F-2000   carvedilol  12.5 mg Oral 2 times per day on Sun Tue Thu Sat   Chlorhexidine Gluconate Cloth  6 each Topical Daily   Chlorhexidine Gluconate Cloth  6 each Topical Q0600   Chlorhexidine Gluconate Cloth  6 each Topical Q0600   Chlorhexidine Gluconate Cloth  6 each Topical Q0600   cinacalcet  30 mg Oral Q M,W,F   cloNIDine  0.1 mg Oral QHS   clopidogrel  75 mg Oral QHS   vitamin B-12  1,000 mcg Oral Daily   darbepoetin (ARANESP) injection - NON-DIALYSIS  150 mcg Subcutaneous Q [id number]   folic acid  1 mg Oral QHS   gabapentin  300 mg Oral QHS   heparin  5,000 Units Subcutaneous Q8H   hydrALAZINE  25 mg Oral 2 times per day on Sun Tue Thu Sat   hydrALAZINE  25 mg Oral Q M,W,F-2000   insulin aspart  0-6 Units Subcutaneous TID WC   insulin aspart  3 Units Subcutaneous TID WC   insulin glargine-yfgn  10 Units Subcutaneous QHS   pantoprazole  40  mg Oral BID   rosuvastatin  20 mg Oral QHS   sodium chloride flush  10-40 mL Intracatheter Q12H   sodium chloride flush  3 mL Intravenous Q12H   Continuous Infusions:  albumin human 25 g (05/27/22 0930)    ceFAZolin (ANCEF) IV     PRN Meds:.acetaminophen **OR** acetaminophen, albumin human, hydrALAZINE, ondansetron **OR** ondansetron (ZOFRAN) IV, oxyCODONE   Objective:   Vitals:   06/02/22 2058 06/02/22 2138 06/02/22 2352 06/03/22 0303  BP: (!) 120/40 (!) 93/36 (!) 103/29 (!) 119/56  Pulse: 81 82  78  Resp: 17   18  Temp: (!) 100.4 F (38 C) 100.3 F (37.9 C)  98.1 F (36.7 C)  TempSrc: Oral Oral  Oral  SpO2: 95%   99%  Weight:      Height:        Wt Readings from Last 3 Encounters:  06/01/22 116.3 kg  05/16/22 117.9 kg  02/08/22 117.9 kg     Intake/Output Summary (Last 24 hours) at 06/03/2022 0805 Last data filed at 06/03/2022 0400 Gross per 24 hour  Intake 700 ml  Output --  Net 700 ml     Physical Exam  Awake Alert, No new F.N deficits, R.IJ HD [place].AT,PERRAL Supple Neck, No JVD,  Symmetrical Chest wall movement, Good air movement bilaterally, CTAB RRR,No Gallops,Rubs or new Murmurs,  +ve B.Sounds, Abd Soft, No tenderness,   L BKA, R AKA   Labs  Recent Labs  Lab 05/29/22 0409 05/30/22 0047 05/30/22 0746 05/31/22 0417 06/01/22 0254 06/02/22 0349  WBC 17.6* 14.8*  --  21.0* 13.3* 12.1*  HGB 8.0* 7.7*  --  8.7* 7.6* 7.2*  HCT 24.0* 24.1*  --  26.3* 23.3* 22.5*  PLT 166 184 195 160 155 141*  MCV 93.4 93.1  --  92.6 93.6 94.1  MCH 31.1 29.7  --  30.6 30.5 30.1  MCHC 33.3 32.0  --  33.1 32.6 32.0  RDW 17.3* 16.8*  --  16.5* 16.5* 16.1*  LYMPHSABS 1.1 1.1  --  0.5* 1.0 1.1  MONOABS 1.0 0.8  --  0.8 0.9 0.9  EOSABS 0.2 0.2  --  0.1 0.3 0.2  BASOSABS 0.0 0.0  --  0.0 0.0 0.0    Recent Labs  Lab 05/29/22 0414 05/30/22 0047 05/30/22 0746 05/31/22 0417 06/01/22 0254 06/02/22 0349  NA 131* 128*  --  132* 132* 132*  K 3.8 4.1  --  3.9 4.2  3.9  CL 93* 90*  --  93* 94* 94*  CO2 22 23  --  '22 25 28  '$ ANIONGAP 16* 15  --  17* 13 10  GLUCOSE 183* 178*  --  196* 149* 185*  BUN 50* 57*  --  33* 42* 28*  CREATININE 7.09* 8.03*  --  5.19* 6.52* 5.06*  ALBUMIN 2.1* 2.1*  --  2.2* 2.1* 2.0*  DDIMER  --   --  2.26*  --   --   --   INR  --  1.3* 1.2  --   --   --   CALCIUM 9.3 9.5  --  9.2 9.2 9.1    Radiology Reports ECHO TEE  Result Date: 05/31/2022    TRANSESOPHOGEAL ECHO REPORT   Patient Name:   Ellington Kaneko. Date of Exam: 05/31/2022 Medical Rec #:  [id number]         Height:       72.0 in Accession #:    [id number]        Weight:       249.1 lb Date of Birth:  05/10/1955         BSA:          2.339 m Patient Age:    32 years          BP:           111/46 mmHg Patient Gender: M                 HR:           72 bpm. Exam Location:  Inpatient Procedure: 3D Echo, Color Doppler, Cardiac Doppler and Transesophageal Echo Indications:    Bacteremia  History:        Patient has prior history of Echocardiogram examinations, most                 recent 05/27/2022. Cardiomyopathy and CHF, Defibrillator, Stroke                 and TIA, Signs/Symptoms:Dyspnea; Risk Factors:Current Smoker,                 Hypertension, Dyslipidemia and Diabetes. CKD.  Sonographer:    Eartha Inch Referring Phys: [id number] [place]: After discussion of the  risks and benefits of a TEE, an informed consent was obtained from the patient. The transesophogeal probe was passed without difficulty through the esophogus of the patient. Imaged were obtained with the patient in a left lateral decubitus position. Sedation performed by different physician. The patient was monitored while under deep sedation. Anesthestetic sedation was provided intravenously by Anesthesiology: 211.'31mg'$  of Propofol. Image quality was good. The patient's vital signs; including heart rate, blood pressure, and oxygen saturation; remained stable throughout the procedure. The patient  developed no complications during the procedure.  IMPRESSIONS  1. 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 Perforation of left coronary cusp aortic valve leaflet. The aortic valve is tricuspid. Aortic valve regurgitation is moderate to severe. Mild aortic stenosis.  3. Large vegetation measuring 1.5 cm x 1.5 cm appears attached to both ICD lead and septal lealfet of tricuspid valve on atrial side. Mild tricuspid regurgitation. There is another vegetation measuring 1.3 cm x 0.4 cm attached to ICD lead in right atrium.  4. Left ventricular ejection fraction, by estimation, is 45 to 50%. The left ventricle has mildly decreased function.  5. Right ventricular systolic function is normal. The right ventricular size is mildly enlarged  6. The mitral valve is degenerative. Trivial mitral valve regurgitation.  7. No left atrial/left atrial appendage thrombus was detected. FINDINGS  Left Ventricle: Left ventricular ejection fraction, by estimation, is 45 to 50%. The left ventricle has mildly decreased function. The left ventricular internal cavity size was normal in size. Right Ventricle: The right ventricular size is mildly enlarged. No increase in right ventricular wall thickness. Right ventricular systolic function is normal. Left Atrium: Left atrial size was normal in size. No left atrial/left atrial appendage thrombus was detected. Right Atrium: Right atrial size was normal in size. Pericardium: There is no evidence of pericardial effusion. Mitral Valve: The mitral valve is degenerative in appearance. Trivial mitral valve regurgitation. Tricuspid Valve: The tricuspid valve is abnormal. Tricuspid valve regurgitation is mild. Aortic Valve: The aortic valve is tricuspid. Aortic valve regurgitation is moderate to severe. Aortic regurgitation PHT measures 392 msec. Mild aortic stenosis is present. Aortic valve mean gradient measures 10.0 mmHg. Aortic valve peak gradient measures   19.9 mmHg. Aortic valve area, by VTI measures 1.81 cm. Pulmonic Valve: The pulmonic valve was grossly normal. Pulmonic valve regurgitation is trivial. Aorta: The aortic root is normal in size and structure. IAS/Shunts: No atrial level shunt detected by color flow Doppler. Additional Comments: Spectral Doppler performed. LEFT VENTRICLE PLAX 2D LVOT diam:     2.10 cm LV SV:         85 LV SV Index:   36 LVOT Area:     3.46 cm  AORTIC VALVE AV Area (Vmax):    1.83 cm AV Area (Vmean):   1.70 cm AV Area (VTI):     1.81 cm AV Vmax:           223.00 cm/s AV Vmean:          150.000 cm/s AV VTI:            0.468 m AV Peak Grad:      19.9 mmHg AV Mean Grad:      10.0 mmHg LVOT Vmax:         118.00 cm/s LVOT Vmean:        73.600 cm/s LVOT VTI:          0.245 m LVOT/AV VTI ratio: 0.52 AI PHT:  392 msec  AORTA Ao Root diam: 3.70 cm  SHUNTS Systemic VTI:  0.24 m Systemic Diam: 2.10 cm Oswaldo Milian MD Electronically signed by Oswaldo Milian MD Signature Date/Time: 05/31/2022/1:43:49 PM    Final    IR US Guide Vasc Access Right  Result Date: 05/30/2022 CLINICAL DATA:  End-stage renal disease and removal of previously placed right IJ tunneled hemodialysis catheter on 05/27/2022 due to bacteremia. The patient has now been satisfactory treated for bacteremia and requires a new tunneled hemodialysis catheter for continued hemodialysis needs. EXAM: TUNNELED CENTRAL VENOUS HEMODIALYSIS CATHETER PLACEMENT WITH ULTRASOUND AND FLUOROSCOPIC GUIDANCE ANESTHESIA/SEDATION: Moderate (conscious) sedation was employed during this procedure. A total of Versed 1.0 mg and Fentanyl 50 mcg was administered intravenously by radiology nursing. Moderate Sedation Time: 27 minutes. The patient's level of consciousness and vital signs were monitored continuously by radiology nursing throughout the procedure under my direct supervision. MEDICATIONS: 2 g IV Ancef. FLUOROSCOPY: 5 minutes and 30 seconds.  130 mGy. PROCEDURE: The  procedure, risks, benefits, and alternatives were explained to the patient. Questions regarding the procedure were encouraged and answered. The patient understands and consents to the procedure. A timeout was performed prior to initiating the procedure. A time-out was performed prior to initiating the procedure. The right neck and chest were prepped with chlorhexidine in a sterile fashion, and a sterile drape was applied covering the operative field. Maximum barrier sterile technique with sterile gowns and gloves were used for the procedure. Local anesthesia was provided with 1% lidocaine. Ultrasound was used to confirm patency of the right internal jugular vein. A permanent ultrasound image was saved and recorded. After creating a small venotomy incision, a 21 gauge needle was advanced into the right internal jugular vein under direct, real-time ultrasound guidance. Ultrasound image documentation was performed. After securing guidewire access, an 8 Fr dilator was placed. A J-wire was kinked to measure appropriate catheter length. A Palindrome tunneled hemodialysis catheter measuring 19 cm from tip to cuff was chosen for placement. This was tunneled in a retrograde fashion from the chest wall to the venotomy incision. At the venotomy, serial dilatation was performed and a 15 Fr peel-away sheath was placed over a guidewire. The catheter was then placed through the sheath and the sheath removed. Final catheter positioning was confirmed and documented with a fluoroscopic spot image. The catheter was aspirated, flushed with saline, and injected with appropriate volume heparin dwells. The venotomy incision was closed with subcuticular 4-0 Vicryl. Dermabond was applied to the incision. The catheter exit site was secured with 0-Prolene retention sutures. COMPLICATIONS: None.  No pneumothorax. FINDINGS: After catheter placement, the tip lies in the right atrium. The catheter aspirates normally and is ready for immediate  use. IMPRESSION: Placement of tunneled hemodialysis catheter via the right internal jugular vein. The catheter tip lies in the right atrium. The catheter is ready for immediate use. Electronically Signed   By: Aletta Edouard M.D.   On: 05/30/2022 13:25   IR Fluoro Guide CV Line Right  Result Date: 05/30/2022 CLINICAL DATA:  End-stage renal disease and removal of previously placed right IJ tunneled hemodialysis catheter on 05/27/2022 due to bacteremia. The patient has now been satisfactory treated for bacteremia and requires a new tunneled hemodialysis catheter for continued hemodialysis needs. EXAM: TUNNELED CENTRAL VENOUS HEMODIALYSIS CATHETER PLACEMENT WITH ULTRASOUND AND FLUOROSCOPIC GUIDANCE ANESTHESIA/SEDATION: Moderate (conscious) sedation was employed during this procedure. A total of Versed 1.0 mg and Fentanyl 50 mcg was administered intravenously by radiology nursing. Moderate Sedation Time: 71  minutes. The patient's level of consciousness and vital signs were monitored continuously by radiology nursing throughout the procedure under my direct supervision. MEDICATIONS: 2 g IV Ancef. FLUOROSCOPY: 5 minutes and 30 seconds.  130 mGy. PROCEDURE: The procedure, risks, benefits, and alternatives were explained to the patient. Questions regarding the procedure were encouraged and answered. The patient understands and consents to the procedure. A timeout was performed prior to initiating the procedure. A time-out was performed prior to initiating the procedure. The right neck and chest were prepped with chlorhexidine in a sterile fashion, and a sterile drape was applied covering the operative field. Maximum barrier sterile technique with sterile gowns and gloves were used for the procedure. Local anesthesia was provided with 1% lidocaine. Ultrasound was used to confirm patency of the right internal jugular vein. A permanent ultrasound image was saved and recorded. After creating a small venotomy incision, a 21  gauge needle was advanced into the right internal jugular vein under direct, real-time ultrasound guidance. Ultrasound image documentation was performed. After securing guidewire access, an 8 Fr dilator was placed. A J-wire was kinked to measure appropriate catheter length. A Palindrome tunneled hemodialysis catheter measuring 19 cm from tip to cuff was chosen for placement. This was tunneled in a retrograde fashion from the chest wall to the venotomy incision. At the venotomy, serial dilatation was performed and a 15 Fr peel-away sheath was placed over a guidewire. The catheter was then placed through the sheath and the sheath removed. Final catheter positioning was confirmed and documented with a fluoroscopic spot image. The catheter was aspirated, flushed with saline, and injected with appropriate volume heparin dwells. The venotomy incision was closed with subcuticular 4-0 Vicryl. Dermabond was applied to the incision. The catheter exit site was secured with 0-Prolene retention sutures. COMPLICATIONS: None.  No pneumothorax. FINDINGS: After catheter placement, the tip lies in the right atrium. The catheter aspirates normally and is ready for immediate use. IMPRESSION: Placement of tunneled hemodialysis catheter via the right internal jugular vein. The catheter tip lies in the right atrium. The catheter is ready for immediate use. Electronically Signed   By: Aletta Edouard M.D.   On: 05/30/2022 13:25      Signature  -   Lala Lund M.D on 06/03/2022 at 8:05 AM   -  To page go to [email or web address]

## 2022-06-04 DIAGNOSIS — R7881 Bacteremia: Secondary | ICD-10-CM | POA: Diagnosis not present

## 2022-06-04 LAB — CBC WITH DIFFERENTIAL/PLATELET
Abs Immature Granulocytes: 0.05 10*3/uL (ref 0.00–0.07)
Basophils Absolute: 0 10*3/uL (ref 0.0–0.1)
Basophils Relative: 0 %
Eosinophils Absolute: 0.2 10*3/uL (ref 0.0–0.5)
Eosinophils Relative: 1 %
HCT: 23.5 % — ABNORMAL LOW (ref 39.0–52.0)
Hemoglobin: 7.3 g/dL — ABNORMAL LOW (ref 13.0–17.0)
Immature Granulocytes: 0 %
Lymphocytes Relative: 6 %
Lymphs Abs: 0.8 10*3/uL (ref 0.7–4.0)
MCH: 29.9 pg (ref 26.0–34.0)
MCHC: 31.1 g/dL (ref 30.0–36.0)
MCV: 96.3 fL (ref 80.0–100.0)
Monocytes Absolute: 0.9 10*3/uL (ref 0.1–1.0)
Monocytes Relative: 7 %
Neutro Abs: 10.8 10*3/uL — ABNORMAL HIGH (ref 1.7–7.7)
Neutrophils Relative %: 86 %
Platelets: 201 10*3/uL (ref 150–400)
RBC: 2.44 MIL/uL — ABNORMAL LOW (ref 4.22–5.81)
RDW: 15.9 % — ABNORMAL HIGH (ref 11.5–15.5)
WBC: 12.7 10*3/uL — ABNORMAL HIGH (ref 4.0–10.5)
nRBC: 0 % (ref 0.0–0.2)

## 2022-06-04 LAB — GLUCOSE, CAPILLARY
Glucose-Capillary: 153 mg/dL — ABNORMAL HIGH (ref 70–99)
Glucose-Capillary: 165 mg/dL — ABNORMAL HIGH (ref 70–99)
Glucose-Capillary: 179 mg/dL — ABNORMAL HIGH (ref 70–99)
Glucose-Capillary: 143 mg/dL — ABNORMAL HIGH (ref 70–99)

## 2022-06-04 LAB — RENAL FUNCTION PANEL
Albumin: 2.4 g/dL — ABNORMAL LOW (ref 3.5–5.0)
Anion gap: 15 (ref 5–15)
BUN: 23 mg/dL (ref 8–23)
CO2: 25 mmol/L (ref 22–32)
Calcium: 9.2 mg/dL (ref 8.9–10.3)
Chloride: 92 mmol/L — ABNORMAL LOW (ref 98–111)
Creatinine, Ser: 4.45 mg/dL — ABNORMAL HIGH (ref 0.61–1.24)
GFR, Estimated: 14 mL/min — ABNORMAL LOW (ref >60)
Glucose, Bld: 193 mg/dL — ABNORMAL HIGH (ref 70–99)
Phosphorus: 2.5 mg/dL (ref 2.5–4.6)
Potassium: 4 mmol/L (ref 3.5–5.1)
Sodium: 132 mmol/L — ABNORMAL LOW (ref 135–145)

## 2022-06-04 MED ORDER — DOCUSATE SODIUM 100 MG PO CAPS
200.0000 mg | ORAL_CAPSULE | Freq: Two times a day (BID) | ORAL | Status: DC
  Administered 2022-06-04 – 2022-06-06 (×6): 200 mg via ORAL
  Filled 2022-06-04 (×7): qty 2

## 2022-06-04 MED ORDER — BISACODYL 5 MG PO TBEC
10.0000 mg | DELAYED_RELEASE_TABLET | Freq: Once | ORAL | Status: AC
  Administered 2022-06-04: 10 mg via ORAL
  Filled 2022-06-04: qty 2

## 2022-06-04 MED ORDER — AMLODIPINE BESYLATE 5 MG PO TABS
5.0000 mg | ORAL_TABLET | Freq: Every day | ORAL | Status: DC
  Administered 2022-06-04: 5 mg via ORAL
  Filled 2022-06-04: qty 1

## 2022-06-04 MED ORDER — POLYETHYLENE GLYCOL 3350 17 G PO PACK
17.0000 g | PACK | Freq: Two times a day (BID) | ORAL | Status: DC
  Administered 2022-06-04 – 2022-06-06 (×6): 17 g via ORAL
  Filled 2022-06-04 (×6): qty 1

## 2022-06-04 MED ORDER — CALCIUM ACETATE (PHOS BINDER) 667 MG PO CAPS
667.0000 mg | ORAL_CAPSULE | Freq: Three times a day (TID) | ORAL | Status: DC
  Administered 2022-06-04 – 2022-06-07 (×8): 667 mg via ORAL
  Filled 2022-06-04 (×8): qty 1

## 2022-06-04 NOTE — Progress Notes (Signed)
PROGRESS NOTE                                                                                                                                                                                                             Patient Demographics:    [name], is a 67 y.o. male, DOB - 06/02/1955, [id number]  Outpatient Primary MD for the patient is [name], MD    LOS - 10  Admit date - 05/24/2022    Chief Complaint  Patient presents with   Shortness of Breath       Brief Narrative (HPI from H&P)   51 yom w/ hypertension,IDDM,PAD,anemia, pacemaker, and ESRD on HD, sent from dialysis for evaluation of bacteremia.   Patient severely fatigued for the past 1 week, missed dialysis 2/20 for this reason, and was contacted by dialysis personnel, informed that he had an infection in his blood, and was directed to the ED. No fever, chills, cough, dysuria, abdominal pain, vomiting, diarrhea, neck stiffness, or new rash. In [id number] and saturating mid 90s on room air. CT C/A/P notable for mild skin thickening and edema of the abdominal wall without gas or abscess, left renal lesion, right greater than left gynecomastia, and other nonacute findings.    He was admitted to the hospital for bacteremia in the presence of dialysis tunneled catheter, AICD, seen by nephrology, ID and EP.  Currently on IV antibiotics awaiting further workup.  He was transferred under my care on 05/30/2022 on day 5 of hospital stay.   Subjective:   Active   Assessment  & Plan :     Bacteremia with HD center blood cx growing [place] with history of HD catheter and AICD device, TEE suspicious for aortic root abscess, AICD vegetation, tricuspid valve endocarditis -  Sent by HD to the ED due to bacteremia from blood cultures on 2/15, underwent echocardiogram showing -   He is on IV antibiotics per ID, underwent tunneled catheter removal by IR on 05/27/2022,  new tunneled right IJ dialysis catheter was placed on 05/30/2022 after line holidays.  On IV Ancef, repeat cultures thus far negative appears nontoxic, TEE as above. Cardiology/EP and cardiothoracic surgery both consulted.  Per EP not a candidate for AICD removal due to complex anatomy and underlying medical issues, per cardiothoracic surgery not a surgical candidate.  Continue  medical treatment.  Long-term prognosis is guarded.  Per ID - Cefazolin with HD 6 weeks for [facility] removal on 05/27/22 plus rifampin 300 mg PO bid for 6 weeks (EOT 07/08/22) .   Class 1 obesity due to excess calories with body mass index (BMI) of 33.0 to 33.9 in adult -Body mass index is 33.79 kg/m..  -Weight loss should be encouraged -Outpatient PCP/bariatric medicine f/u encouraged   Gynecomastia -Right more than left  -Incidentally noted on  CT  -Outpatient follow-up and consideration for mammogram is recommended    Kidney lesion, native, left -Incidentally noted on CT -Outpatient MRI recommended   ESRD (end stage renal disease) on dialysis ([place]) -Patient on chronic MWF HD -Nephrology prn order set utilized -Due for HD again tomorrow post-catheter placement  Anemia -Associated with ESRD -However H&H serially dropping, no Treysen blood in stool, pending Hemoccult stool  stable DIC panel, LDH , Haptoglobin, placed on PPI twice daily and monitor H&H closely.  Type screen done, no signs of brisk ongoing bleeding.  DM (diabetes mellitus), type 2 with peripheral vascular complications (HCC) -123456 5.6, good control -Continue glargine -Cover with very sensitive-scale SSI   CBG (last 3)  Recent Labs    06/03/22 1606 06/03/22 1948 06/04/22 0717  GLUCAP 173* 178* 153*     Essential hypertension -Continue amlodipine, carvedilol, clonidine, hydralazine  Hyperlipidemia -Continue rosuvastatin -Hold Vascepa      Condition - Extremely Guarded  Family Communication  : Called brother [name] on 05/31/2022 at 7:40 AM  and message left [phone]   Code Status :  Full  Consults  :  ID, EP, Renal, IR  PUD Prophylaxis : PPI   Procedures  :     TEE - 1. 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 Perforation of left coronary cusp aortic valve leaflet. The aortic valve is tricuspid. Aortic valve regurgitation is moderate to severe. Mild aortic stenosis.  3. Large vegetation measuring 1.5 cm x 1.5 cm appears attached to both ICD lead and septal lealfet of tricuspid valve on atrial side. Mild tricuspid regurgitation. There is another vegetation measuring 1.3 cm x 0.4 cm attached to ICD lead in right atrium.  4. Left ventricular ejection fraction, by estimation, is 45 to 50%. The left ventricle has mildly decreased function.  5. Right ventricular systolic function is normal. The right ventricular size is mildly enlarged  6. The mitral valve is degenerative. Trivial mitral valve regurgitation.  7. No left atrial/left atrial appendage thrombus was detected.   TTE - 1. Left ventricular ejection fraction, by estimation, is 45 to 50%. The left ventricle has mildly decreased function. The left ventricle demonstrates regional wall motion abnormalities (see scoring diagram/findings for description). There is moderate concentric left ventricular hypertrophy. Left ventricular diastolic parameters are consistent with Grade I diastolic dysfunction (impaired relaxation).  2. Right ventricular systolic function is normal. The right ventricular size is normal. There is normal pulmonary artery systolic pressure.  3. The mitral valve is degenerative. Trivial mitral valve regurgitation.  4. The aortic valve is tricuspid. There is mild calcification of the aortic valve. There is mild thickening of the aortic valve. Aortic valve regurgitation is not visualized. Mild aortic valve stenosis. Aortic valve mean gradient measures 11.0 mmHg.  5. Aortic dilatation noted. There is mild dilatation of the  aortic root, measuring 41 mm.  6. The inferior vena cava is normal in size with greater than 50% respiratory variability, suggesting right atrial pressure of 3  mmHg.  7. Device lead present in RA/RV with thickening and echodensity (approximately 1cm x 2cm) noted on atrial side of tricuspid valve concerning for vegetation in the setting of known bacteremia. TEE indicated for further evaluation as lead extraction may need to be considered. Comparison(s): Prior images unable to be directly viewed.       Disposition Plan  :    Status is: Inpatient  DVT Prophylaxis  :    heparin injection 5,000 Units Start: 05/25/22 0600   Lab Results  Component Value Date   PLT 201 06/04/2022    Diet :  Diet Order             Diet renal/carb modified with fluid restriction Diet-HS Snack? Nothing; Fluid restriction: 1200 mL Fluid; Room service appropriate? Yes; Fluid consistency: Thin  Diet effective now                    Inpatient Medications  Scheduled Meds:  amLODipine  10 mg Oral QHS   aspirin  81 mg Oral QHS   brimonidine  1 drop Right Eye TID   calcitRIOL  2.25 mcg Oral Q M,W,F   calcium acetate  1,334 mg Oral TID WC   carvedilol  12.5 mg Oral Q M,W,F-2000   carvedilol  12.5 mg Oral 2 times per day on Sun Tue Thu Sat   Chlorhexidine Gluconate Cloth  6 each Topical Q0600   cinacalcet  30 mg Oral Q M,W,F   cloNIDine  0.1 mg Oral QHS   clopidogrel  75 mg Oral QHS   vitamin B-12  1,000 mcg Oral Daily   darbepoetin (ARANESP) injection - NON-DIALYSIS  150 mcg Subcutaneous Q Mon-1800   ferrous sulfate  325 mg Oral TID WC   folic acid  1 mg Oral QHS   gabapentin  300 mg Oral QHS   heparin  5,000 Units Subcutaneous Q8H   hydrALAZINE  25 mg Oral 2 times per day on Sun Tue Thu Sat   hydrALAZINE  25 mg Oral Q M,W,F-2000   insulin aspart  0-6 Units Subcutaneous TID WC   insulin aspart  3 Units Subcutaneous TID WC   insulin glargine-yfgn  10 Units Subcutaneous QHS   pantoprazole  40 mg Oral  BID   rifampin  300 mg Oral Q12H   rosuvastatin  20 mg Oral QHS   sodium chloride flush  10-40 mL Intracatheter Q12H   sodium chloride flush  3 mL Intravenous Q12H   Continuous Infusions:   ceFAZolin (ANCEF) IV 2 g (06/03/22 2138)   PRN Meds:.acetaminophen **OR** acetaminophen, hydrALAZINE, ondansetron **OR** ondansetron (ZOFRAN) IV, oxyCODONE   Objective:   Vitals:   06/03/22 1515 06/03/22 1600 06/03/22 2235 06/04/22 0457  BP:  (!) 87/72 (!) 106/44 (!) 111/43  Pulse:  85 86 92  Resp:  '18 14 19  '$ Temp:  98.5 F (36.9 C) 99.2 F (37.3 C) 99.9 F (37.7 C)  TempSrc:  Oral Oral Oral  SpO2:  98% 90% 97%  Weight: 113.4 kg   108.5 kg  Height:        Wt Readings from Last 3 Encounters:  06/04/22 108.5 kg  05/16/22 117.9 kg  02/08/22 117.9 kg     Intake/Output Summary (Last 24 hours) at 06/04/2022 0754 Last data filed at 06/04/2022 0500 Gross per 24 hour  Intake 240 ml  Output 3500 ml  Net -3260 ml     Physical Exam  Awake Alert, No new F.N deficits, R.IJ HD Cath  [place].AT,PERRAL Supple Neck, No JVD,   Symmetrical Chest wall movement, Good air movement bilaterally, CTAB RRR,No Gallops,Rubs or new Murmurs,  +ve B.Sounds, Abd Soft, No tenderness,   L BKA, R AKA   Labs  Recent Labs  Lab 05/31/22 0417 06/01/22 0254 06/02/22 0349 06/03/22 0839 06/04/22 0342  WBC 21.0* 13.3* 12.1* 13.3* 12.7*  HGB 8.7* 7.6* 7.2* 7.1* 7.3*  HCT 26.3* 23.3* 22.5* 22.1* 23.5*  PLT 160 155 141* 176 201  MCV 92.6 93.6 94.1 94.8 96.3  MCH 30.6 30.5 30.1 30.5 29.9  MCHC 33.1 32.6 32.0 32.1 31.1  RDW 16.5* 16.5* 16.1* 16.0* 15.9*  LYMPHSABS 0.5* 1.0 1.1 1.3 0.8  MONOABS 0.8 0.9 0.9 0.9 0.9  EOSABS 0.1 0.3 0.2 0.2 0.2  BASOSABS 0.0 0.0 0.0 0.0 0.0    Recent Labs  Lab 05/30/22 0047 05/30/22 0746 05/31/22 0417 06/01/22 0254 06/02/22 0349 06/03/22 0839 06/04/22 0342  NA 128*  --  132* 132* 132* 130* 132*  K 4.1  --  3.9 4.2 3.9 4.2 4.0  CL 90*  --  93* 94* 94* 93* 92*  CO2 23  --   '22 25 28 24 25  '$ ANIONGAP 15  --  17* '13 10 13 15  '$ GLUCOSE 178*  --  196* 149* 185* 154* 193*  BUN 57*  --  33* 42* 28* 41* 23  CREATININE 8.03*  --  5.19* 6.52* 5.06* 6.84* 4.45*  ALBUMIN 2.1*  --  2.2* 2.1* 2.0* 2.1* 2.4*  DDIMER  --  2.26*  --   --   --   --   --   INR 1.3* 1.2  --   --   --   --   --   CALCIUM 9.5  --  9.2 9.2 9.1 9.3 9.2    Radiology Reports ECHO TEE  Result Date: 05/31/2022    TRANSESOPHOGEAL ECHO REPORT   Patient Name:   [name]. Date of Exam: 05/31/2022 Medical Rec #:  [id number]         Height:       72.0 in Accession #:    [id number]        Weight:       249.1 lb Date of Birth:  [birth year]         BSA:          2.339 m Patient Age:    43 years          BP:           111/46 mmHg Patient Gender: M                 HR:           72 bpm. Exam Location:  Inpatient Procedure: 3D Echo, Color Doppler, Cardiac Doppler and Transesophageal Echo Indications:    Bacteremia  History:        Patient has prior history of Echocardiogram examinations, most                 recent 05/27/2022. Cardiomyopathy and CHF, Defibrillator, Stroke                 and TIA, Signs/Symptoms:Dyspnea; Risk Factors:Current Smoker,                 Hypertension, Dyslipidemia and Diabetes. CKD.  Sonographer:    Eartha Inch Referring Phys: [id number] [place]: After discussion of the risks and benefits of a TEE, an informed consent was obtained  from the patient. The transesophogeal probe was passed without difficulty through the esophogus of the patient. Imaged were obtained with the patient in a left lateral decubitus position. Sedation performed by different physician. The patient was monitored while under deep sedation. Anesthestetic sedation was provided intravenously by Anesthesiology: 211.'31mg'$  of Propofol. Image quality was good. The patient's vital signs; including heart rate, blood pressure, and oxygen saturation; remained stable throughout the procedure. The patient developed  no complications during the procedure.  IMPRESSIONS  1. 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 Perforation of left coronary cusp aortic valve leaflet. The aortic valve is tricuspid. Aortic valve regurgitation is moderate to severe. Mild aortic stenosis.  3. Large vegetation measuring 1.5 cm x 1.5 cm appears attached to both ICD lead and septal lealfet of tricuspid valve on atrial side. Mild tricuspid regurgitation. There is another vegetation measuring 1.3 cm x 0.4 cm attached to ICD lead in right atrium.  4. Left ventricular ejection fraction, by estimation, is 45 to 50%. The left ventricle has mildly decreased function.  5. Right ventricular systolic function is normal. The right ventricular size is mildly enlarged  6. The mitral valve is degenerative. Trivial mitral valve regurgitation.  7. No left atrial/left atrial appendage thrombus was detected. FINDINGS  Left Ventricle: Left ventricular ejection fraction, by estimation, is 45 to 50%. The left ventricle has mildly decreased function. The left ventricular internal cavity size was normal in size. Right Ventricle: The right ventricular size is mildly enlarged. No increase in right ventricular wall thickness. Right ventricular systolic function is normal. Left Atrium: Left atrial size was normal in size. No left atrial/left atrial appendage thrombus was detected. Right Atrium: Right atrial size was normal in size. Pericardium: There is no evidence of pericardial effusion. Mitral Valve: The mitral valve is degenerative in appearance. Trivial mitral valve regurgitation. Tricuspid Valve: The tricuspid valve is abnormal. Tricuspid valve regurgitation is mild. Aortic Valve: The aortic valve is tricuspid. Aortic valve regurgitation is moderate to severe. Aortic regurgitation PHT measures 392 msec. Mild aortic stenosis is present. Aortic valve mean gradient measures 10.0 mmHg. Aortic valve peak gradient measures  19.9  mmHg. Aortic valve area, by VTI measures 1.81 cm. Pulmonic Valve: The pulmonic valve was grossly normal. Pulmonic valve regurgitation is trivial. Aorta: The aortic root is normal in size and structure. IAS/Shunts: No atrial level shunt detected by color flow Doppler. Additional Comments: Spectral Doppler performed. LEFT VENTRICLE PLAX 2D LVOT diam:     2.10 cm LV SV:         85 LV SV Index:   36 LVOT Area:     3.46 cm  AORTIC VALVE AV Area (Vmax):    1.83 cm AV Area (Vmean):   1.70 cm AV Area (VTI):     1.81 cm AV Vmax:           223.00 cm/s AV Vmean:          150.000 cm/s AV VTI:            0.468 m AV Peak Grad:      19.9 mmHg AV Mean Grad:      10.0 mmHg LVOT Vmax:         118.00 cm/s LVOT Vmean:        73.600 cm/s LVOT VTI:          0.245 m LVOT/AV VTI ratio: 0.52 AI PHT:  392 msec  AORTA Ao Root diam: 3.70 cm  SHUNTS Systemic VTI:  0.24 m Systemic Diam: 2.10 cm Oswaldo Milian MD Electronically signed by Oswaldo Milian MD Signature Date/Time: 05/31/2022/1:43:49 PM    Final       Signature  -   Lala Lund M.D on 06/04/2022 at 7:54 AM   -  To page go to [email or web address]

## 2022-06-04 NOTE — Progress Notes (Signed)
[facility] NEPHROLOGY PROGRESS NOTE  Assessment/ Plan: Pt is a 67 y.o. yo male   Dialysis Orders:  Center: [facility]  on MWF . EDW 116.5kg, [place] 2K/2.5Ca,  Time 4 hours ,Access RIJ TDC  BFR 400 DFR 500 UF profile 2    Meds: mircera 75 mcg every 2 weeks (due again on 2/26) Calcitriol 2.25 mcg three times a week Senispar 30 mg in center three times a week Heparin 2000 units IVP then 1000 units/hr.   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  Status post HD catheter removal by IR on 2/23 for line holiday.  On IV Ancef and repeat cultures are negative so far.  New catheter placed on 2/26.  No surgical intervention per CT surgery. Per Pharmacist: plan for Cefazolin 2g/HD-MWF and rifampin 300 mg twice daily thru 07/08/22.  It was faxed to his home HD unit.  # ESRD on HD, MWF: New tunneled HD catheter placed on 05/30/2022 by IR.  He had dialysis yesterday with around 3.4 L ultrafiltration, tolerated well.  Plan for next HD on 3/4.  # Anemia: Received Aranesp on 2/26, continue with.Monitor hemoglobin.  # Secondary hyperparathyroidism: Continue calcitriol and Sensipar.  I will lower PhosLo dose because of low phosphorus level.  Monitor lab.  # HTN/volume: Blood pressure low therefore I will lower amlodipine to 5 mg.  UF with HD.  # Hyponatremia, hypervolemic: Continue fluid restriction and ultrafiltration during HD.  Subjective: Seen and examined at bedside.  No new event.  Tolerated dialysis well.  Denies nausea, vomiting, chest pain, shortness of breath. Objective Vital signs in last 24 hours: Vitals:   06/03/22 1600 06/03/22 2235 06/04/22 0457 06/04/22 0809  BP: (!) 87/72 (!) 106/44 (!) 111/43 (!) 105/35  Pulse: 85 86 92 88  Resp: '18 14 19 16  '$ Temp: 98.5 F (36.9 C) 99.2 F (37.3 C) 99.9 F (37.7 C) 98.7 F (37.1 C)  TempSrc: Oral Oral Oral Oral  SpO2: 98% 90% 97% 93%  Weight:    108.5 kg   Height:       Weight change:   Intake/Output Summary (Last 24 hours) at 06/04/2022 1030 Last data filed at 06/04/2022 0813 Gross per 24 hour  Intake 240 ml  Output 3500 ml  Net -3260 ml        Labs: RENAL PANEL Recent Labs    05/24/22 1725 05/25/22 0517 05/26/22 0513 05/27/22 0427 05/28/22 1151 05/29/22 0414 05/30/22 0047 05/31/22 0417 06/01/22 0254 06/02/22 0349 06/03/22 0839 06/04/22 0342  NA 131* 132* 132* 131* 131* 131* 128* 132* 132* 132* 130* 132*  K 4.0 4.1 3.8 3.7 4.5 3.8 4.1 3.9 4.2 3.9 4.2 4.0  CL 92* 95* 95* 95* 94* 93* 90* 93* 94* 94* 93* 92*  CO2 22 21* 23 23 21* '22 23 22 25 28 24 25  '$ GLUCOSE 140* 134* 156* 150* 210* 183* 178* 196* 149* 185* 154* 193*  BUN 86* 94* 56* 69* 45* 50* 57* 33* 42* 28* 41* 23  CREATININE 9.50* 9.91* 6.44* 7.92* 5.99* 7.09* 8.03* 5.19* 6.52* 5.06* 6.84* 4.45*  CALCIUM 9.1 8.9 8.6* 9.0 8.9 9.3 9.5 9.2 9.2 9.1 9.3 9.2  MG 2.1  --   --   --   --   --   --   --   --   --   --   --   PHOS  --  6.4*  --   --   --  4.5 5.1* 3.4 4.7* 3.7 4.3 2.5  ALBUMIN 2.3* 2.1*  --   --   --  2.1* 2.1* 2.2* 2.1* 2.0* 2.1* 2.4*      Liver Function Tests: Recent Labs  Lab 06/02/22 0349 06/03/22 0839 06/04/22 0342  ALBUMIN 2.0* 2.1* 2.4*    No results for input(s): "LIPASE", "AMYLASE" in the last 168 hours. No results for input(s): "AMMONIA" in the last 168 hours. CBC: Recent Labs    05/25/22 0517 05/26/22 0513 05/30/22 0746 05/31/22 0417 06/01/22 0254 06/02/22 0349 06/03/22 0839 06/04/22 0342  HGB 7.1*   < >  --  8.7* 7.6* 7.2* 7.1* 7.3*  MCV 91.8   < >  --  92.6 93.6 94.1 94.8 96.3  VITAMINB12 268  --  288  --   --   --   --   --   FOLATE 25.3  --  34.5  --   --   --   --   --   FERRITIN 4,259*  --  3,315*  --   --   --   --   --   TIBC NOT CALCULATED  --  119*  --   --   --   --   --   IRON 42*  --  39*  --   --   --   --   --   RETICCTPCT 2.5  --  3.5*  --   --   --   --   --    < > = values in this interval not  displayed.     Cardiac Enzymes: No results for input(s): "CKTOTAL", "CKMB", "CKMBINDEX", "TROPONINI" in the last 168 hours. CBG: Recent Labs  Lab 06/02/22 2100 06/03/22 0744 06/03/22 1606 06/03/22 1948 06/04/22 0717  GLUCAP 173* 140* 173* 178* 153*     Iron Studies:  No results for input(s): "IRON", "TIBC", "TRANSFERRIN", "FERRITIN" in the last 72 hours.  Studies/Results: No results found.  Medications: Infusions:   ceFAZolin (ANCEF) IV 2 g (06/03/22 2138)    Scheduled Medications:  amLODipine  10 mg Oral QHS   aspirin  81 mg Oral QHS   brimonidine  1 drop Right Eye TID   calcitRIOL  2.25 mcg Oral Q M,W,F   calcium acetate  1,334 mg Oral TID WC   carvedilol  12.5 mg Oral Q M,W,F-2000   carvedilol  12.5 mg Oral 2 times per day on Sun Tue Thu Sat   Chlorhexidine Gluconate Cloth  6 each Topical Q0600   cinacalcet  30 mg Oral Q M,W,F   cloNIDine  0.1 mg Oral QHS   clopidogrel  75 mg Oral QHS   vitamin B-12  1,000 mcg Oral Daily   darbepoetin (ARANESP) injection - NON-DIALYSIS  150 mcg Subcutaneous Q Mon-1800   ferrous sulfate  325 mg Oral TID WC   folic acid  1 mg Oral QHS   gabapentin  300 mg Oral QHS   heparin  5,000 Units Subcutaneous Q8H   hydrALAZINE  25 mg Oral 2 times per day on Sun Tue Thu Sat   hydrALAZINE  25 mg Oral Q M,W,F-2000   insulin aspart  0-6 Units Subcutaneous TID WC   insulin aspart  3 Units Subcutaneous TID WC   insulin glargine-yfgn  10 Units Subcutaneous QHS   pantoprazole  40 mg Oral BID   rifampin  300 mg Oral Q12H   rosuvastatin  20 mg Oral QHS   sodium chloride flush  10-40 mL  Intracatheter Q12H   sodium chloride flush  3 mL Intravenous Q12H    have reviewed scheduled and prn medications.  Physical Exam: General:NAD, comfortable Heart:RRR, s1s2 nl Lungs: Clear b/l, no crackle Abdomen:soft, Non-tender, non-distended Extremities: Bilateral BKA, trace stump edema Dialysis Access: Right IJ TDC in place, site clean.  No  bleeding.  Adilynne Fitzwater Prasad Alejo Beamer 06/04/2022,10:30 AM  LOS: 10 days

## 2022-06-05 DIAGNOSIS — R7881 Bacteremia: Secondary | ICD-10-CM | POA: Diagnosis not present

## 2022-06-05 LAB — CBC WITH DIFFERENTIAL/PLATELET
Abs Immature Granulocytes: 0.07 10*3/uL (ref 0.00–0.07)
Basophils Absolute: 0.1 10*3/uL (ref 0.0–0.1)
Basophils Relative: 0 %
Eosinophils Absolute: 0.3 10*3/uL (ref 0.0–0.5)
Eosinophils Relative: 3 %
HCT: 20.8 % — ABNORMAL LOW (ref 39.0–52.0)
Hemoglobin: 6.8 g/dL — CL (ref 13.0–17.0)
Immature Granulocytes: 1 %
Lymphocytes Relative: 10 %
Lymphs Abs: 1.2 10*3/uL (ref 0.7–4.0)
MCH: 30.8 pg (ref 26.0–34.0)
MCHC: 32.7 g/dL (ref 30.0–36.0)
MCV: 94.1 fL (ref 80.0–100.0)
Monocytes Absolute: 1 10*3/uL (ref 0.1–1.0)
Monocytes Relative: 8 %
Neutro Abs: 9.5 10*3/uL — ABNORMAL HIGH (ref 1.7–7.7)
Neutrophils Relative %: 78 %
Platelets: 188 10*3/uL (ref 150–400)
RBC: 2.21 MIL/uL — ABNORMAL LOW (ref 4.22–5.81)
RDW: 15.9 % — ABNORMAL HIGH (ref 11.5–15.5)
WBC: 12.1 10*3/uL — ABNORMAL HIGH (ref 4.0–10.5)
nRBC: 0 % (ref 0.0–0.2)

## 2022-06-05 LAB — GLUCOSE, CAPILLARY
Glucose-Capillary: 122 mg/dL — ABNORMAL HIGH (ref 70–99)
Glucose-Capillary: 144 mg/dL — ABNORMAL HIGH (ref 70–99)
Glucose-Capillary: 156 mg/dL — ABNORMAL HIGH (ref 70–99)
Glucose-Capillary: 159 mg/dL — ABNORMAL HIGH (ref 70–99)

## 2022-06-05 LAB — RENAL FUNCTION PANEL
Albumin: 2.2 g/dL — ABNORMAL LOW (ref 3.5–5.0)
Anion gap: 15 (ref 5–15)
BUN: 38 mg/dL — ABNORMAL HIGH (ref 8–23)
CO2: 25 mmol/L (ref 22–32)
Calcium: 9.6 mg/dL (ref 8.9–10.3)
Chloride: 92 mmol/L — ABNORMAL LOW (ref 98–111)
Creatinine, Ser: 6.28 mg/dL — ABNORMAL HIGH (ref 0.61–1.24)
GFR, Estimated: 9 mL/min — ABNORMAL LOW (ref >60)
Glucose, Bld: 158 mg/dL — ABNORMAL HIGH (ref 70–99)
Phosphorus: 3.8 mg/dL (ref 2.5–4.6)
Potassium: 4.3 mmol/L (ref 3.5–5.1)
Sodium: 132 mmol/L — ABNORMAL LOW (ref 135–145)

## 2022-06-05 LAB — PREPARE RBC (CROSSMATCH)

## 2022-06-05 MED ORDER — LACTULOSE 10 GM/15ML PO SOLN
20.0000 g | Freq: Once | ORAL | Status: AC
  Administered 2022-06-05: 20 g via ORAL
  Filled 2022-06-05: qty 30

## 2022-06-05 MED ORDER — CHLORHEXIDINE GLUCONATE CLOTH 2 % EX PADS
6.0000 | MEDICATED_PAD | Freq: Every day | CUTANEOUS | Status: DC
  Administered 2022-06-05 – 2022-06-06 (×2): 6 via TOPICAL

## 2022-06-05 MED ORDER — CARVEDILOL 6.25 MG PO TABS: 6.2500 mg | ORAL_TABLET | ORAL | Status: DC

## 2022-06-05 MED ORDER — SODIUM CHLORIDE 0.9% IV SOLUTION: Freq: Once | INTRAVENOUS | Status: AC

## 2022-06-05 MED ORDER — HYDRALAZINE HCL 25 MG PO TABS
25.0000 mg | ORAL_TABLET | ORAL | Status: DC
  Filled 2022-06-05: qty 1

## 2022-06-05 NOTE — Plan of Care (Signed)
  Problem: Education: Goal: Ability to describe self-care measures that may prevent or decrease complications (Diabetes Survival Skills Education) will improve Outcome: Progressing   

## 2022-06-05 NOTE — Progress Notes (Signed)
PROGRESS NOTE                                                                                                                                                                                                             Patient Demographics:    [name], is a 67 y.o. male, DOB - 11/08/1955, KF:[id number]  Outpatient Primary MD for the patient is [name], MD    LOS - 8  Admit date - 05/24/2022    Chief Complaint  Patient presents with   Shortness of Breath       Brief Narrative (HPI from H&P)   24 yom w/ hypertension,IDDM,PAD,anemia, pacemaker, and ESRD on HD, sent from dialysis for evaluation of bacteremia.   Patient severely fatigued for the past 1 week, missed dialysis 2/20 for this reason, and was contacted by dialysis personnel, informed that he had an infection in his blood, and was directed to the ED. No fever, chills, cough, dysuria, abdominal pain, vomiting, diarrhea, neck stiffness, or new rash. In [id number] and saturating mid 90s on room air. CT C/A/P notable for mild skin thickening and edema of the abdominal wall without gas or abscess, left renal lesion, right greater than left gynecomastia, and other nonacute findings.    He was admitted to the hospital for bacteremia in the presence of dialysis tunneled catheter, AICD, seen by nephrology, ID and EP.  Currently on IV antibiotics awaiting further workup.  He was transferred under my care on 05/30/2022 on day 5 of hospital stay.   Subjective:   Patient in bed, appears comfortable, denies any headache, no fever, no chest pain or pressure, no shortness of breath , no abdominal pain. No new focal weakness.   Assessment  & Plan :     Bacteremia with HD center blood cx growing S [place] with history of HD catheter and AICD device, TEE suspicious for aortic root abscess, AICD vegetation, tricuspid valve endocarditis -  Sent by HD to the ED due to bacteremia  from blood cultures on 2/15, underwent echocardiogram showing -   He is on IV antibiotics per ID, underwent tunneled catheter removal by IR on 05/27/2022, new tunneled right IJ dialysis catheter was placed on 05/30/2022 after line holidays.  On IV Ancef, repeat cultures thus far negative appears nontoxic, TEE as above. Cardiology/EP and cardiothoracic surgery both consulted.  Per EP not a candidate for AICD removal due to complex anatomy and underlying medical issues, per cardiothoracic surgery not a surgical candidate.  Continue medical treatment.  Long-term prognosis is guarded.  Will continue antibiotics as directed by ID, look for SNF bed.  Per ID - Cefazolin with HD 6 weeks for [facility] removal on 05/27/22 plus rifampin 300 mg PO bid for 6 weeks (EOT 07/08/22) .   Class 1 obesity due to excess calories with body mass index (BMI) of 33.0 to 33.9 in adult -Body mass index is 33.79 kg/m..  -Weight loss should be encouraged -Outpatient PCP/bariatric medicine f/u encouraged   Gynecomastia -Right more than left  -Incidentally noted on  CT  -Outpatient follow-up and consideration for mammogram is recommended    Kidney lesion, native, left -Incidentally noted on CT -Outpatient MRI recommended   ESRD (end stage renal disease) on dialysis ([place]) -Patient on chronic MWF HD -Nephrology prn order set utilized -Due for HD again tomorrow post-catheter placement  Anemia -Associated with ESRD -However H&H serially dropping, no [place] blood in stool, stable DIC panel, LDH , Haptoglobin, placed on PPI twice daily and monitor H&H closely.  Type screen done, no signs of brisk ongoing bleeding.  Essential hypertension - blood pressure medications adjusted as blood pressure running slightly low, monitor  Hyperlipidemia -Continue rosuvastatin -Hold Vascepa  DM type 2 with peripheral vascular complications (HCC) -123456 5.6, good control -Continue glargine -Cover with very sensitive-scale SSI   CBG (last 3)   Recent Labs    06/04/22 1613 06/04/22 2117 06/05/22 0713  GLUCAP 179* 143* 122*        Condition - Extremely Guarded  Family Communication  : Called brother [name] on 05/31/2022 at 7:40 AM and message left [phone]   Code Status :  Full  Consults  :  ID, EP, Renal, IR  PUD Prophylaxis : PPI   Procedures  :     TEE - 1. 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 Perforation of left coronary cusp aortic valve leaflet. The aortic valve is tricuspid. Aortic valve regurgitation is moderate to severe. Mild aortic stenosis.  3. Large vegetation measuring 1.5 cm x 1.5 cm appears attached to both ICD lead and septal lealfet of tricuspid valve on atrial side. Mild tricuspid regurgitation. There is another vegetation measuring 1.3 cm x 0.4 cm attached to ICD lead in right atrium.  4. Left ventricular ejection fraction, by estimation, is 45 to 50%. The left ventricle has mildly decreased function.  5. Right ventricular systolic function is normal. The right ventricular size is mildly enlarged  6. The mitral valve is degenerative. Trivial mitral valve regurgitation.  7. No left atrial/left atrial appendage thrombus was detected.   TTE - 1. Left ventricular ejection fraction, by estimation, is 45 to 50%. The left ventricle has mildly decreased function. The left ventricle demonstrates regional wall motion abnormalities (see scoring diagram/findings for description). There is moderate concentric left ventricular hypertrophy. Left ventricular diastolic parameters are consistent with Grade I diastolic dysfunction (impaired relaxation).  2. Right ventricular systolic function is normal. The right ventricular size is normal. There is normal pulmonary artery systolic pressure.  3. The mitral valve is degenerative. Trivial mitral valve regurgitation.  4. The aortic valve is tricuspid. There is mild calcification of the aortic valve. There is mild thickening of the  aortic valve. Aortic valve regurgitation is not visualized. Mild aortic valve stenosis. Aortic valve mean gradient measures 11.0 mmHg.  5. Aortic dilatation noted. There is mild dilatation of the aortic root, measuring 41 mm.  6. The inferior vena cava is normal in size with greater than 50% respiratory variability, suggesting right atrial pressure of 3 mmHg.  7. Device lead present in RA/RV with thickening and echodensity (approximately 1cm x 2cm) noted on atrial side of tricuspid valve concerning for vegetation in the setting of known bacteremia. TEE indicated for further evaluation as lead extraction may need to be considered. Comparison(s): Prior images unable to be directly viewed.       Disposition Plan  :    Status is: Inpatient  DVT Prophylaxis  :    heparin injection 5,000 Units Start: 05/25/22 0600   Lab Results  Component Value Date   PLT 201 06/04/2022    Diet :  Diet Order             Diet renal/carb modified with fluid restriction Diet-HS Snack? Nothing; Fluid restriction: 1200 mL Fluid; Room service appropriate? Yes; Fluid consistency: Thin  Diet effective now                    Inpatient Medications  Scheduled Meds:  aspirin  81 mg Oral QHS   brimonidine  1 drop Right Eye TID   calcitRIOL  2.25 mcg Oral Q M,W,F   calcium acetate  667 mg Oral TID WC   [START ON 06/06/2022] carvedilol  6.25 mg Oral Q M,W,F-2000   [START ON 06/07/2022] carvedilol  6.25 mg Oral 2 times per day on Sun Tue Thu Sat   Chlorhexidine Gluconate Cloth  6 each Topical Q0600   cinacalcet  30 mg Oral Q M,W,F   cloNIDine  0.1 mg Oral QHS   clopidogrel  75 mg Oral QHS   vitamin B-12  1,000 mcg Oral Daily   darbepoetin (ARANESP) injection - NON-DIALYSIS  150 mcg Subcutaneous Q Mon-1800   docusate sodium  200 mg Oral BID   ferrous sulfate  325 mg Oral TID WC   folic acid  1 mg Oral QHS   gabapentin  300 mg Oral QHS   heparin  5,000 Units Subcutaneous Q8H   hydrALAZINE  25 mg Oral Q  M,W,F-2000   hydrALAZINE  25 mg Oral 2 times per day on Sun Tue Thu Sat   insulin aspart  0-6 Units Subcutaneous TID WC   insulin aspart  3 Units Subcutaneous TID WC   insulin glargine-yfgn  10 Units Subcutaneous QHS   pantoprazole  40 mg Oral BID   polyethylene glycol  17 g Oral BID   rifampin  300 mg Oral Q12H   rosuvastatin  20 mg Oral QHS   sodium chloride flush  10-40 mL Intracatheter Q12H   sodium chloride flush  3 mL Intravenous Q12H   Continuous Infusions:   ceFAZolin (ANCEF) IV 2 g (06/03/22 2138)   PRN Meds:.acetaminophen **OR** acetaminophen, hydrALAZINE, ondansetron **OR** ondansetron (ZOFRAN) IV, oxyCODONE   Objective:   Vitals:   06/04/22 0809 06/04/22 1614 06/04/22 2110 06/05/22 0411  BP: (!) 105/35 130/78 (!) 123/40 (!) 96/43  Pulse: 88 95 81 70  Resp: '16 18 17 17  '$ Temp: 98.7 F (37.1 C) 98 F (36.7 C) 98.6 F (37 C) 97.7 F (36.5 C)  TempSrc: Oral  Oral Oral  SpO2: 93% 92% 97% 96%  Weight:      Height:        Wt Readings from Last 3 Encounters:  06/04/22 108.5 kg  05/16/22 117.9 kg  02/08/22 117.9 kg     Intake/Output Summary (Last 24 hours) at 06/05/2022 0751 Last data filed at 06/05/2022 0549 Gross per 24 hour  Intake 820 ml  Output 0 ml  Net 820 ml     Physical Exam  Awake Alert, No new F.N deficits, R.IJ HD [place].AT,PERRAL Supple Neck, No JVD,   Symmetrical Chest wall movement, Good air movement bilaterally, CTAB RRR,No Gallops,Rubs or new Murmurs,  +ve B.Sounds, Abd Soft, No tenderness,   L BKA, R AKA   Labs  Recent Labs  Lab 05/31/22 0417 06/01/22 0254 06/02/22 0349 06/03/22 0839 06/04/22 0342  WBC 21.0* 13.3* 12.1* 13.3* 12.7*  HGB 8.7* 7.6* 7.2* 7.1* 7.3*  HCT 26.3* 23.3* 22.5* 22.1* 23.5*  PLT 160 155 141* 176 201  MCV 92.6 93.6 94.1 94.8 96.3  MCH 30.6 30.5 30.1 30.5 29.9  MCHC 33.1 32.6 32.0 32.1 31.1  RDW 16.5* 16.5* 16.1* 16.0* 15.9*  LYMPHSABS 0.5* 1.0 1.1 1.3 0.8  MONOABS 0.8 0.9 0.9 0.9 0.9  EOSABS 0.1 0.3  0.2 0.2 0.2  BASOSABS 0.0 0.0 0.0 0.0 0.0    Recent Labs  Lab 05/30/22 0047 05/30/22 0746 05/31/22 0417 06/01/22 0254 06/02/22 0349 06/03/22 0839 06/04/22 0342  NA 128*  --  132* 132* 132* 130* 132*  K 4.1  --  3.9 4.2 3.9 4.2 4.0  CL 90*  --  93* 94* 94* 93* 92*  CO2 23  --  '22 25 28 24 25  '$ ANIONGAP 15  --  17* '13 10 13 15  '$ GLUCOSE 178*  --  196* 149* 185* 154* 193*  BUN 57*  --  33* 42* 28* 41* 23  CREATININE 8.03*  --  5.19* 6.52* 5.06* 6.84* 4.45*  ALBUMIN 2.1*  --  2.2* 2.1* 2.0* 2.1* 2.4*  DDIMER  --  2.26*  --   --   --   --   --   INR 1.3* 1.2  --   --   --   --   --   CALCIUM 9.5  --  9.2 9.2 9.1 9.3 9.2    Radiology Reports No results found.    Signature  -   Lala Lund M.D on 06/05/2022 at 7:51 AM   -  To page go to [email or web address]

## 2022-06-05 NOTE — Progress Notes (Signed)
CRITICAL VALUE ALERT  Critical Value: hgb 6.8   Date & Time Notied:  06/05/22 at 1206pm   Provider Notified: MD Candiss Norse   Orders Received/Actions taken: See new orders

## 2022-06-05 NOTE — Progress Notes (Addendum)
[facility] PROGRESS NOTE  Assessment/ Plan: Pt is a 67 y.o. yo male   Dialysis Orders:  Center: [facility]  on MWF . EDW 116.5kg, Bath 2K/2.5Ca,  Time 4 hours ,Access RIJ TDC  BFR 400 DFR 500 UF profile 2    Meds: mircera 75 mcg every 2 weeks (due again on 2/26) Calcitriol 2.25 mcg three times a week Sensipar 30 mg in center three times a week Heparin 2000 units IVP then 1000 units/hr.   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  Status post HD catheter removal by IR on 2/23 for line holiday.  New catheter placed on 2/26.  No surgical intervention per CT surgery. Per Pharmacist: plan for Cefazolin 2g/HD-MWF and rifampin 300 mg twice daily thru 07/08/22.  It was faxed to his home HD unit.  # ESRD on HD, MWF: New tunneled HD catheter placed on 05/30/2022 by IR. Plan for next HD on 3/4.  # Anemia: Received Aranesp on 2/26, continue with.Monitor hemoglobin.  # Secondary hyperparathyroidism: Continue calcitriol and Sensipar.  I will lower PhosLo dose because of low phosphorus level.  Monitor lab.  # HTN/volume: Blood pressure low therefore dc amlodipine and clonidine.  UF with HD.  # Hyponatremia, hypervolemic: Continue fluid restriction and ultrafiltration during HD.  Subjective: Seen and examined at bedside.  No new event.  Denies nausea, vomiting, chest pain, shortness of breath.  Objective Vital signs in last 24 hours: Vitals:   06/04/22 1614 06/04/22 2110 06/05/22 0411 06/05/22 0844  BP: 130/78 (!) 123/40 (!) 96/43 (!) 100/40  Pulse: 95 81 70 78  Resp: '18 17 17 18  '$ Temp: 98 F (36.7 C) 98.6 F (37 C) 97.7 F (36.5 C) 98 F (36.7 C)  TempSrc:  Oral Oral Oral  SpO2: 92% 97% 96% 96%  Weight:      Height:       Weight change:   Intake/Output Summary (Last 24 hours) at 06/05/2022 0933 Last data filed at 06/05/2022 0549 Gross per 24 hour  Intake 580 ml  Output  0 ml  Net 580 ml        Labs: RENAL PANEL Recent Labs    05/24/22 1725 05/25/22 0517 05/26/22 0513 05/27/22 0427 05/28/22 1151 05/29/22 0414 05/30/22 0047 05/31/22 0417 06/01/22 0254 06/02/22 0349 06/03/22 0839 06/04/22 0342  NA 131* 132* 132* 131* 131* 131* 128* 132* 132* 132* 130* 132*  K 4.0 4.1 3.8 3.7 4.5 3.8 4.1 3.9 4.2 3.9 4.2 4.0  CL 92* 95* 95* 95* 94* 93* 90* 93* 94* 94* 93* 92*  CO2 22 21* 23 23 21* '22 23 22 25 28 24 25  '$ GLUCOSE 140* 134* 156* 150* 210* 183* 178* 196* 149* 185* 154* 193*  BUN 86* 94* 56* 69* 45* 50* 57* 33* 42* 28* 41* 23  CREATININE 9.50* 9.91* 6.44* 7.92* 5.99* 7.09* 8.03* 5.19* 6.52* 5.06* 6.84* 4.45*  CALCIUM 9.1 8.9 8.6* 9.0 8.9 9.3 9.5 9.2 9.2 9.1 9.3 9.2  MG 2.1  --   --   --   --   --   --   --   --   --   --   --   PHOS  --  6.4*  --   --   --  4.5 5.1* 3.4 4.7* 3.7 4.3 2.5  ALBUMIN 2.3* 2.1*  --   --   --  2.1* 2.1* 2.2* 2.1* 2.0* 2.1* 2.4*  Liver Function Tests: Recent Labs  Lab 06/02/22 0349 06/03/22 0839 06/04/22 0342  ALBUMIN 2.0* 2.1* 2.4*    No results for input(s): "LIPASE", "AMYLASE" in the last 168 hours. No results for input(s): "AMMONIA" in the last 168 hours. CBC: Recent Labs    05/25/22 0517 05/26/22 0513 05/30/22 0746 05/31/22 0417 06/01/22 0254 06/02/22 0349 06/03/22 0839 06/04/22 0342  HGB 7.1*   < >  --  8.7* 7.6* 7.2* 7.1* 7.3*  MCV 91.8   < >  --  92.6 93.6 94.1 94.8 96.3  VITAMINB12 268  --  288  --   --   --   --   --   FOLATE 25.3  --  34.5  --   --   --   --   --   FERRITIN 4,259*  --  3,315*  --   --   --   --   --   TIBC NOT CALCULATED  --  119*  --   --   --   --   --   IRON 42*  --  39*  --   --   --   --   --   RETICCTPCT 2.5  --  3.5*  --   --   --   --   --    < > = values in this interval not displayed.     Cardiac Enzymes: No results for input(s): "CKTOTAL", "CKMB", "CKMBINDEX", "TROPONINI" in the last 168 hours. CBG: Recent Labs  Lab 06/04/22 0717 06/04/22 1129  06/04/22 1613 06/04/22 2117 06/05/22 0713  GLUCAP 153* 165* 179* 143* 122*     Iron Studies:  No results for input(s): "IRON", "TIBC", "TRANSFERRIN", "FERRITIN" in the last 72 hours.  Studies/Results: No results found.  Medications: Infusions:   ceFAZolin (ANCEF) IV 2 g (06/03/22 2138)    Scheduled Medications:  aspirin  81 mg Oral QHS   brimonidine  1 drop Right Eye TID   calcitRIOL  2.25 mcg Oral Q M,W,F   calcium acetate  667 mg Oral TID WC   [START ON 06/06/2022] carvedilol  6.25 mg Oral Q M,W,F-2000   [START ON 06/07/2022] carvedilol  6.25 mg Oral 2 times per day on Sun Tue Thu Sat   Chlorhexidine Gluconate Cloth  6 each Topical Q0600   cinacalcet  30 mg Oral Q M,W,F   cloNIDine  0.1 mg Oral QHS   clopidogrel  75 mg Oral QHS   vitamin B-12  1,000 mcg Oral Daily   darbepoetin (ARANESP) injection - NON-DIALYSIS  150 mcg Subcutaneous Q Mon-1800   docusate sodium  200 mg Oral BID   ferrous sulfate  325 mg Oral TID WC   folic acid  1 mg Oral QHS   gabapentin  300 mg Oral QHS   heparin  5,000 Units Subcutaneous Q8H   hydrALAZINE  25 mg Oral Q M,W,F-2000   hydrALAZINE  25 mg Oral 2 times per day on Sun Tue Thu Sat   insulin aspart  0-6 Units Subcutaneous TID WC   insulin aspart  3 Units Subcutaneous TID WC   insulin glargine-yfgn  10 Units Subcutaneous QHS   pantoprazole  40 mg Oral BID   polyethylene glycol  17 g Oral BID   rifampin  300 mg Oral Q12H   rosuvastatin  20 mg Oral QHS   sodium chloride flush  10-40 mL Intracatheter Q12H   sodium chloride flush  3 mL Intravenous Q12H    have  reviewed scheduled and prn medications.  Physical Exam: General:NAD, comfortable Heart:RRR, s1s2 nl Lungs: Clear b/l, no crackle Abdomen:soft, Non-tender, non-distended Extremities: Bilateral BKA, trace stump edema Dialysis Access: Right IJ TDC in place, site clean.  No bleeding.  [name] [name] 06/05/2022,9:33 AM  LOS: 11 days

## 2022-06-06 DIAGNOSIS — R7881 Bacteremia: Secondary | ICD-10-CM | POA: Diagnosis not present

## 2022-06-06 LAB — BPAM RBC
Blood Product Expiration Date: 202403122359
ISSUE DATE / TIME: 202403031835
Unit Type and Rh: 7300

## 2022-06-06 LAB — CBC WITH DIFFERENTIAL/PLATELET
Abs Immature Granulocytes: 0.09 10*3/uL — ABNORMAL HIGH (ref 0.00–0.07)
Basophils Absolute: 0 10*3/uL (ref 0.0–0.1)
Basophils Relative: 0 %
Eosinophils Absolute: 0.3 10*3/uL (ref 0.0–0.5)
Eosinophils Relative: 2 %
HCT: 26.5 % — ABNORMAL LOW (ref 39.0–52.0)
Hemoglobin: 8.8 g/dL — ABNORMAL LOW (ref 13.0–17.0)
Immature Granulocytes: 1 %
Lymphocytes Relative: 8 %
Lymphs Abs: 1.1 10*3/uL (ref 0.7–4.0)
MCH: 30.3 pg (ref 26.0–34.0)
MCHC: 33.2 g/dL (ref 30.0–36.0)
MCV: 91.4 fL (ref 80.0–100.0)
Monocytes Absolute: 1 10*3/uL (ref 0.1–1.0)
Monocytes Relative: 7 %
Neutro Abs: 10.9 10*3/uL — ABNORMAL HIGH (ref 1.7–7.7)
Neutrophils Relative %: 82 %
Platelets: 228 10*3/uL (ref 150–400)
RBC: 2.9 MIL/uL — ABNORMAL LOW (ref 4.22–5.81)
RDW: 17 % — ABNORMAL HIGH (ref 11.5–15.5)
WBC: 13.4 10*3/uL — ABNORMAL HIGH (ref 4.0–10.5)
nRBC: 0 % (ref 0.0–0.2)

## 2022-06-06 LAB — RENAL FUNCTION PANEL
Albumin: 2.4 g/dL — ABNORMAL LOW (ref 3.5–5.0)
Anion gap: 12 (ref 5–15)
BUN: 47 mg/dL — ABNORMAL HIGH (ref 8–23)
CO2: 26 mmol/L (ref 22–32)
Calcium: 9.7 mg/dL (ref 8.9–10.3)
Chloride: 91 mmol/L — ABNORMAL LOW (ref 98–111)
Creatinine, Ser: 7.46 mg/dL — ABNORMAL HIGH (ref 0.61–1.24)
GFR, Estimated: 7 mL/min — ABNORMAL LOW (ref >60)
Glucose, Bld: 141 mg/dL — ABNORMAL HIGH (ref 70–99)
Phosphorus: 3.8 mg/dL (ref 2.5–4.6)
Potassium: 4.6 mmol/L (ref 3.5–5.1)
Sodium: 129 mmol/L — ABNORMAL LOW (ref 135–145)

## 2022-06-06 LAB — TYPE AND SCREEN
ABO/RH(D): B POS
Antibody Screen: NEGATIVE
Unit division: 0

## 2022-06-06 LAB — GLUCOSE, CAPILLARY
Glucose-Capillary: 130 mg/dL — ABNORMAL HIGH (ref 70–99)
Glucose-Capillary: 147 mg/dL — ABNORMAL HIGH (ref 70–99)
Glucose-Capillary: 193 mg/dL — ABNORMAL HIGH (ref 70–99)
Glucose-Capillary: 148 mg/dL — ABNORMAL HIGH (ref 70–99)

## 2022-06-06 MED ORDER — HEPARIN SODIUM (PORCINE) 1000 UNIT/ML IJ SOLN
INTRAMUSCULAR | Status: AC
  Administered 2022-06-06: 3200 [IU]
  Filled 2022-06-06: qty 4

## 2022-06-06 MED ORDER — HEPARIN SODIUM (PORCINE) 1000 UNIT/ML DIALYSIS: 20.0000 [IU]/kg | INTRAMUSCULAR | Status: DC | PRN

## 2022-06-06 MED ORDER — LACTULOSE 10 GM/15ML PO SOLN
30.0000 g | Freq: Two times a day (BID) | ORAL | Status: AC
  Administered 2022-06-06 (×2): 30 g via ORAL
  Filled 2022-06-06 (×2): qty 45

## 2022-06-06 MED ORDER — ALTEPLASE 2 MG IJ SOLR: 2.0000 mg | Freq: Once | INTRAMUSCULAR | Status: DC | PRN

## 2022-06-06 MED ORDER — CARVEDILOL 3.125 MG PO TABS
3.1250 mg | ORAL_TABLET | ORAL | Status: DC
  Administered 2022-06-06: 3.125 mg via ORAL
  Filled 2022-06-06: qty 1

## 2022-06-06 MED ORDER — ANTICOAGULANT SODIUM CITRATE 4% (200MG/5ML) IV SOLN: 5.0000 mL | Status: DC | PRN

## 2022-06-06 MED ORDER — CARVEDILOL 3.125 MG PO TABS
3.1250 mg | ORAL_TABLET | ORAL | Status: DC
  Administered 2022-06-07: 3.125 mg via ORAL
  Filled 2022-06-06: qty 1

## 2022-06-06 MED ORDER — HEPARIN SODIUM (PORCINE) 1000 UNIT/ML DIALYSIS: 1000.0000 [IU] | INTRAMUSCULAR | Status: DC | PRN

## 2022-06-06 MED ORDER — BACLOFEN 10 MG PO TABS
5.0000 mg | ORAL_TABLET | Freq: Once | ORAL | Status: AC
  Administered 2022-06-06: 5 mg via ORAL
  Filled 2022-06-06: qty 1

## 2022-06-06 MED ORDER — BACLOFEN 1 MG/ML ORAL SUSPENSION
2.5000 mg | Freq: Two times a day (BID) | ORAL | Status: DC
  Administered 2022-06-07: 2.5 mg via ORAL
  Filled 2022-06-06 (×3): qty 2.5

## 2022-06-06 MED ORDER — CALCIUM CARBONATE ANTACID 500 MG PO CHEW
1.0000 | CHEWABLE_TABLET | Freq: Three times a day (TID) | ORAL | Status: DC | PRN
  Administered 2022-06-06 (×2): 200 mg via ORAL
  Filled 2022-06-06 (×2): qty 1

## 2022-06-06 MED ORDER — MAGNESIUM HYDROXIDE 400 MG/5ML PO SUSP
30.0000 mL | Freq: Two times a day (BID) | ORAL | Status: AC
  Administered 2022-06-06 (×2): 30 mL via ORAL
  Filled 2022-06-06 (×3): qty 30

## 2022-06-06 NOTE — Progress Notes (Signed)
[facility] Progress Note   Assessment/ Plan:   Center: [facility]  on MWF . EDW 116.5kg, Bath 2K/2.5Ca,  Time 4 hours ,Access RIJ TDC  BFR 400 DFR 500 UF profile 2    Meds: mircera 75 mcg every 2 weeks (due again on 2/26) Calcitriol 2.25 mcg three times a week Sensipar 30 mg in center three times a week Heparin 2000 units IVP then 1000 units/hr.    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  Status post HD catheter removal by IR on 2/23 for line holiday.  New catheter placed on 2/26.  No surgical intervention per CT surgery. Per Pharmacist: plan for Cefazolin 2g/HD-MWF and rifampin 300 mg twice daily thru 07/08/22.  It was faxed to his home HD unit.   # ESRD on HD, MWF: New tunneled HD catheter placed on 05/30/2022 by IR. HD on schedule 06/06/22   # Anemia: Received Aranesp on 2/26, continue with.Monitor hemoglobin.   # Secondary hyperparathyroidism: Continue calcitriol and Sensipar.  I will lower PhosLo dose because of low phosphorus level.  Monitor lab.   # HTN/volume: Blood pressure low therefore dc amlodipine and clonidine.  UF with HD.   # Hyponatremia, hypervolemic: Continue fluid restriction and ultrafiltration during HD.  #Psoriasis- will order triamcinolone cream  Subjective:    Seen and examined on dialysis. Feeling OK.  Has a large goal- will UF as tolerated.     Objective:   BP (S) (!) 124/48 Comment: MAp 71  Pulse 80   Temp 98.6 F (37 C)   Resp (!) 27   Ht 6' (1.829 m)   Wt 100.9 kg   SpO2 100%   BMI 30.17 kg/m   Physical Exam: Gen:NAD, lying in bed CVS: RRR Resp: clear, on O2 Abd: obese, NABS Ext: 2+ LLE edema, trace RLE edema ACCESS: R IJ TDC SKIN: + mutliple psoriasis patches   Labs: BMET Recent Labs  Lab 05/31/22 0417 06/01/22 0254 06/02/22 0349 06/03/22 0839 06/04/22 0342 06/05/22 1130 06/06/22 0654  NA 132* 132* 132* 130* 132* 132*  129*  K 3.9 4.2 3.9 4.2 4.0 4.3 4.6  CL 93* 94* 94* 93* 92* 92* 91*  CO2 '22 25 28 24 25 25 26  '$ GLUCOSE 196* 149* 185* 154* 193* 158* 141*  BUN 33* 42* 28* 41* 23 38* 47*  CREATININE 5.19* 6.52* 5.06* 6.84* 4.45* 6.28* 7.46*  CALCIUM 9.2 9.2 9.1 9.3 9.2 9.6 9.7  PHOS 3.4 4.7* 3.7 4.3 2.5 3.8 3.8   CBC Recent Labs  Lab 06/03/22 0839 06/04/22 0342 06/05/22 1130 06/06/22 0654  WBC 13.3* 12.7* 12.1* 13.4*  NEUTROABS 10.7* 10.8* 9.5* 10.9*  HGB 7.1* 7.3* 6.8* 8.8*  HCT 22.1* 23.5* 20.8* 26.5*  MCV 94.8 96.3 94.1 91.4  PLT 176 201 188 228      Medications:     aspirin  81 mg Oral QHS   baclofen  5 mg Oral Once   baclofen  2.5 mg Oral BID   brimonidine  1 drop Right Eye TID   calcitRIOL  2.25 mcg Oral Q M,W,F   calcium acetate  667 mg Oral TID WC   [START ON 06/07/2022] carvedilol  3.125 mg Oral 2 times per day on Sun Tue Thu Sat   carvedilol  3.125 mg Oral Q M,W,F-2000   Chlorhexidine Gluconate Cloth  6 each Topical Q0600   Chlorhexidine Gluconate Cloth  6 each Topical Q0600   cinacalcet  30 mg Oral Q M,W,F   clopidogrel  75 mg Oral QHS   vitamin B-12  1,000 mcg Oral Daily   darbepoetin (ARANESP) injection - NON-DIALYSIS  150 mcg Subcutaneous Q Mon-1800   docusate sodium  200 mg Oral BID   ferrous sulfate  325 mg Oral TID WC   folic acid  1 mg Oral QHS   gabapentin  300 mg Oral QHS   heparin  5,000 Units Subcutaneous Q8H   insulin aspart  0-6 Units Subcutaneous TID WC   insulin aspart  3 Units Subcutaneous TID WC   insulin glargine-yfgn  10 Units Subcutaneous QHS   lactulose  30 g Oral BID   magnesium hydroxide  30 mL Oral BID   pantoprazole  40 mg Oral BID   polyethylene glycol  17 g Oral BID   rifampin  300 mg Oral Q12H   rosuvastatin  20 mg Oral QHS   sodium chloride flush  10-40 mL Intracatheter Q12H   sodium chloride flush  3 mL Intravenous Q12H     Madelon Lips, MD 06/06/2022, 10:03 AM

## 2022-06-06 NOTE — Progress Notes (Signed)
Physical Therapy Treatment Patient Details Name: [name]. MRN: [id number] DOB: Nov 15, 1955 Today's Date: 06/06/2022   History of Present Illness 67 yo male presenting to [facility] ED 2/20 from dialysis with possible infection. Diagnosised with Staph Lugdunensis bacteremia. Catheter removed on 2/23 for a line holiday. PMH bil BKA, HTN, insulin-dependent diabetes mellitus, PAD, anemia, and ESRD on hemodialysis.    PT Comments    Pt with continued c/o posterior neck pain, limiting progress towards acute goals this date. Pt declining transfer to chair 2/2 comfort of chair despite encouragement and discussion of how to place pillows for increased comfort. Pt able to complete cervical exercise/stretching with assist. Performed soft tissue work and placed heat. Pt declining further mobility. Current plan remains appropriate to address deficits and maximize functional independence and decrease caregiver burden. Pt continues to benefit from skilled PT services to progress toward functional mobility goals.    Recommendations for follow up therapy are one component of a multi-disciplinary discharge planning process, led by the attending physician.  Recommendations may be updated based on patient status, additional functional criteria and insurance authorization.  Follow Up Recommendations  Skilled nursing-short term rehab (<3 hours/day) Can patient physically be transported by private vehicle: No   Assistance Recommended at Discharge Frequent or constant Supervision/Assistance  Patient can return home with the following A little help with walking and/or transfers;A little help with bathing/dressing/bathroom;Assist for transportation;Help with stairs or ramp for entrance   Equipment Recommendations  None recommended by PT    Recommendations for Other Services       Precautions / Restrictions Precautions Precautions: Fall Restrictions Weight Bearing Restrictions: Yes RLE Weight Bearing: Non  weight bearing LLE Weight Bearing: Non weight bearing     Mobility  Bed Mobility Overal bed mobility: Modified Independent Bed Mobility: Sit to Sidelying         Sit to sidelying: Supervision General bed mobility comments: seated EOB on arrival, moving from sidellying R/L througout session for comfort    Transfers                   General transfer comment: pt declining transfer    Ambulation/Gait                   Stairs             Wheelchair Mobility    Modified Rankin (Stroke Patients Only)       Balance Overall balance assessment: Needs assistance Sitting-balance support: No upper extremity supported, Feet unsupported Sitting balance-Leahy Scale: Poor Sitting balance - Comments: leaning L/R throughout session for pressure relief                                    Cognition Arousal/Alertness: Awake/alert Behavior During Therapy: Flat affect Overall Cognitive Status: Impaired/Different from baseline                                          Exercises Other Exercises Other Exercises: AROM cervical flexion extension x5, with prolonged holds x5 Other Exercises: AROM lateral cervical rotation x 5 ea side    General Comments General comments (skin integrity, edema, etc.): VSS on RA      Pertinent Vitals/Pain Pain Assessment Pain Assessment: Faces Faces Pain Scale: Hurts even more Pain Location: neck Pain Descriptors / Indicators:  Aching, Grimacing Pain Intervention(s): Repositioned, Heat applied, Other (comment) (soft tissue massage)    Home Living                          Prior Function            PT Goals (current goals can now be found in the care plan section) Acute Rehab PT Goals PT Goal Formulation: With patient Time For Goal Achievement: 06/09/22    Frequency    Min 3X/week      PT Plan      Co-evaluation              AM-PAC PT "6 Clicks" Mobility    Outcome Measure  Help needed turning from your back to your side while in a flat bed without using bedrails?: A Little Help needed moving from lying on your back to sitting on the side of a flat bed without using bedrails?: A Little Help needed moving to and from a bed to a chair (including a wheelchair)?: Total Help needed standing up from a chair using your arms (e.g., wheelchair or bedside chair)?: Total Help needed to walk in hospital room?: Total Help needed climbing 3-5 steps with a railing? : Total 6 Click Score: 10    End of Session Equipment Utilized During Treatment: Oxygen Activity Tolerance: Patient tolerated treatment well;Other (comment) (declining further mobility) Patient left: in bed;with call bell/phone within reach Nurse Communication: Mobility status PT Visit Diagnosis: Other abnormalities of gait and mobility (R26.89);Muscle weakness (generalized) (M62.81)     Time: [id number] PT Time Calculation (min) (ACUTE ONLY): 16 min  Charges:  $Therapeutic Exercise: 8-22 mins                     Faysal Fenoglio R. PTA Acute Rehabilitation Services Office: [place] 06/06/2022, 2:04 PM

## 2022-06-06 NOTE — Progress Notes (Signed)
PROGRESS NOTE                                                                                                                                                                                                             Patient Demographics:    [name], is a 67 y.o. male, DOB - 02/08/1956, [id number]  Outpatient Primary MD for the patient is [name], MD    LOS - 12  Admit date - 05/24/2022    Chief Complaint  Patient presents with   Shortness of Breath       Brief Narrative (HPI from H&P)   51 yom w/ hypertension,IDDM,PAD,anemia, pacemaker, and ESRD on HD, sent from dialysis for evaluation of bacteremia.   Patient severely fatigued for the past 1 week, missed dialysis 2/20 for this reason, and was contacted by dialysis personnel, informed that he had an infection in his blood, and was directed to the ED. No fever, chills, cough, dysuria, abdominal pain, vomiting, diarrhea, neck stiffness, or new rash. In [id number] and saturating mid 90s on room air. CT C/A/P notable for mild skin thickening and edema of the abdominal wall without gas or abscess, left renal lesion, right greater than left gynecomastia, and other nonacute findings.    He was admitted to the hospital for bacteremia in the presence of dialysis tunneled catheter, AICD, seen by nephrology, ID and EP.  Currently on IV antibiotics awaiting further workup.  He was transferred under my care on 05/30/2022 on day 5 of hospital stay.   Subjective:   Patient in bed appears to be in no distress denies any headache chest or abdominal pain, feels constipated, some off-and-on right-sided neck spasm says ongoing for 2 to 3 weeks.  Assessment  & Plan :   Bacteremia with HD center blood cx growing S Lugdunesi with history of HD catheter and AICD device, TEE suspicious for aortic root abscess, AICD vegetation, tricuspid valve endocarditis -  Sent by HD to the ED due to  bacteremia from blood cultures on 2/15, underwent echocardiogram showing -   He is on IV antibiotics per ID, underwent tunneled catheter removal by IR on 05/27/2022, new tunneled right IJ dialysis catheter was placed on 05/30/2022 after line holidays.  On IV Ancef, repeat cultures thus far negative appears nontoxic, TEE as above. Cardiology/EP and cardiothoracic surgery both consulted.  Per EP not a candidate for AICD removal due to complex anatomy and underlying medical issues, per cardiothoracic surgery not a surgical candidate.  Continue medical treatment.  Long-term prognosis is guarded.  Will continue antibiotics as directed by ID, look for SNF bed.  Per ID - Cefazolin with HD 6 weeks for [facility] removal on 05/27/22 plus rifampin 300 mg PO bid for 6 weeks (EOT 07/08/22) .   Class 1 obesity due to excess calories with body mass index (BMI) of 33.0 to 33.9 in adult -Body mass index is 33.79 kg/m..  -Weight loss should be encouraged -Outpatient PCP/bariatric medicine f/u encouraged   Gynecomastia -Right more than left  -Incidentally noted on  CT  -Outpatient follow-up and consideration for mammogram is recommended    Kidney lesion, native, left -Incidentally noted on CT -Outpatient MRI recommended   ESRD (end stage renal disease) on dialysis ([place]) -Patient on chronic MWF HD -Nephrology prn order set utilized -Due for HD again tomorrow post-catheter placement  Anemia -Associated with ESRD -However H&H serially dropping, no Melvin blood in stool, stable DIC panel, LDH , Haptoglobin, placed on PPI twice daily and monitor H&H closely.  Type screen done, no signs of brisk ongoing bleeding.  Essential hypertension - blood pressure medications adjusted as blood pressure running slightly low, monitor  Hyperlipidemia -Continue rosuvastatin -Hold Vascepa  Constipation.  Placed on bowel regimen.  Monitor.  Abdominal exam benign.    DM type 2 with peripheral vascular complications (HCC) -123456  5.6, good control -Continue glargine -Cover with very sensitive-scale SSI   CBG (last 3)  Recent Labs    06/05/22 1619 06/05/22 2125 06/06/22 0726  GLUCAP 156* 159* 130*        Condition - Extremely Guarded  Family Communication  : Called brother [name] on 05/31/2022 at 7:40 AM and message left [phone]   Code Status :  Full  Consults  :  ID, EP, Renal, IR  PUD Prophylaxis : PPI   Procedures  :     TEE - 1. 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 Perforation of left coronary cusp aortic valve leaflet. The aortic valve is tricuspid. Aortic valve regurgitation is moderate to severe. Mild aortic stenosis.  3. Large vegetation measuring 1.5 cm x 1.5 cm appears attached to both ICD lead and septal lealfet of tricuspid valve on atrial side. Mild tricuspid regurgitation. There is another vegetation measuring 1.3 cm x 0.4 cm attached to ICD lead in right atrium.  4. Left ventricular ejection fraction, by estimation, is 45 to 50%. The left ventricle has mildly decreased function.  5. Right ventricular systolic function is normal. The right ventricular size is mildly enlarged  6. The mitral valve is degenerative. Trivial mitral valve regurgitation.  7. No left atrial/left atrial appendage thrombus was detected.   TTE - 1. Left ventricular ejection fraction, by estimation, is 45 to 50%. The left ventricle has mildly decreased function. The left ventricle demonstrates regional wall motion abnormalities (see scoring diagram/findings for description). There is moderate concentric left ventricular hypertrophy. Left ventricular diastolic parameters are consistent with Grade I diastolic dysfunction (impaired relaxation).  2. Right ventricular systolic function is normal. The right ventricular size is normal. There is normal pulmonary artery systolic pressure.  3. The mitral valve is degenerative. Trivial mitral valve regurgitation.  4. The aortic valve is  tricuspid. There is mild calcification of the aortic valve. There is mild thickening of the aortic valve. Aortic valve regurgitation  is not visualized. Mild aortic valve stenosis. Aortic valve mean gradient measures 11.0 mmHg.  5. Aortic dilatation noted. There is mild dilatation of the aortic root, measuring 41 mm.  6. The inferior vena cava is normal in size with greater than 50% respiratory variability, suggesting right atrial pressure of 3 mmHg.  7. Device lead present in RA/RV with thickening and echodensity (approximately 1cm x 2cm) noted on atrial side of tricuspid valve concerning for vegetation in the setting of known bacteremia. TEE indicated for further evaluation as lead extraction may need to be considered. Comparison(s): Prior images unable to be directly viewed.       Disposition Plan  :    Status is: Inpatient  DVT Prophylaxis  :    heparin injection 5,000 Units Start: 05/25/22 0600   Lab Results  Component Value Date   PLT 228 06/06/2022    Diet :  Diet Order             Diet renal/carb modified with fluid restriction Diet-HS Snack? Nothing; Fluid restriction: 1200 mL Fluid; Room service appropriate? Yes; Fluid consistency: Thin  Diet effective now                    Inpatient Medications  Scheduled Meds:  aspirin  81 mg Oral QHS   baclofen  5 mg Oral Once   baclofen  2.5 mg Oral BID   brimonidine  1 drop Right Eye TID   calcitRIOL  2.25 mcg Oral Q M,W,F   calcium acetate  667 mg Oral TID WC   [START ON 06/07/2022] carvedilol  3.125 mg Oral 2 times per day on Sun Tue Thu Sat   carvedilol  3.125 mg Oral Q M,W,F-2000   Chlorhexidine Gluconate Cloth  6 each Topical Q0600   Chlorhexidine Gluconate Cloth  6 each Topical Q0600   cinacalcet  30 mg Oral Q M,W,F   clopidogrel  75 mg Oral QHS   vitamin B-12  1,000 mcg Oral Daily   darbepoetin (ARANESP) injection - NON-DIALYSIS  150 mcg Subcutaneous Q Mon-1800   docusate sodium  200 mg Oral BID   ferrous sulfate   325 mg Oral TID WC   folic acid  1 mg Oral QHS   gabapentin  300 mg Oral QHS   heparin  5,000 Units Subcutaneous Q8H   insulin aspart  0-6 Units Subcutaneous TID WC   insulin aspart  3 Units Subcutaneous TID WC   insulin glargine-yfgn  10 Units Subcutaneous QHS   lactulose  30 g Oral BID   magnesium hydroxide  30 mL Oral BID   pantoprazole  40 mg Oral BID   polyethylene glycol  17 g Oral BID   rifampin  300 mg Oral Q12H   rosuvastatin  20 mg Oral QHS   sodium chloride flush  10-40 mL Intracatheter Q12H   sodium chloride flush  3 mL Intravenous Q12H   Continuous Infusions:  anticoagulant sodium citrate      ceFAZolin (ANCEF) IV 2 g (06/03/22 2138)   PRN Meds:.acetaminophen **OR** acetaminophen, alteplase, anticoagulant sodium citrate, calcium carbonate, heparin, heparin, hydrALAZINE, ondansetron **OR** ondansetron (ZOFRAN) IV, oxyCODONE   Objective:   Vitals:   06/06/22 0753 06/06/22 0814 06/06/22 0830 06/06/22 0903  BP: 109/60 (!) 123/107 128/61 (!) 121/55  Pulse: 83 85  86  Resp: '16 12  13  '$ Temp: 98.6 F (37 C)     TempSrc:      SpO2: 94% 100%  100%  Weight:  100.9 kg 100.9 kg    Height:        Wt Readings from Last 3 Encounters:  06/06/22 100.9 kg  05/16/22 117.9 kg  02/08/22 117.9 kg     Intake/Output Summary (Last 24 hours) at 06/06/2022 0940 Last data filed at 06/06/2022 0800 Gross per 24 hour  Intake 1011.83 ml  Output 0 ml  Net 1011.83 ml     Physical Exam  Awake Alert, No new F.N deficits, R.IJ HD [place].AT,PERRAL Supple Neck, No JVD,   Symmetrical Chest wall movement, Good air movement bilaterally, CTAB RRR,No Gallops,Rubs or new Murmurs,  +ve B.Sounds, Abd Soft, No tenderness,   L BKA, R AKA   Labs  Recent Labs  Lab 06/02/22 0349 06/03/22 0839 06/04/22 0342 06/05/22 1130 06/06/22 0654  WBC 12.1* 13.3* 12.7* 12.1* 13.4*  HGB 7.2* 7.1* 7.3* 6.8* 8.8*  HCT 22.5* 22.1* 23.5* 20.8* 26.5*  PLT 141* 176 201 188 228  MCV 94.1 94.8 96.3 94.1  91.4  MCH 30.1 30.5 29.9 30.8 30.3  MCHC 32.0 32.1 31.1 32.7 33.2  RDW 16.1* 16.0* 15.9* 15.9* 17.0*  LYMPHSABS 1.1 1.3 0.8 1.2 1.1  MONOABS 0.9 0.9 0.9 1.0 1.0  EOSABS 0.2 0.2 0.2 0.3 0.3  BASOSABS 0.0 0.0 0.0 0.1 0.0    Recent Labs  Lab 06/02/22 0349 06/03/22 0839 06/04/22 0342 06/05/22 1130 06/06/22 0654  NA 132* 130* 132* 132* 129*  K 3.9 4.2 4.0 4.3 4.6  CL 94* 93* 92* 92* 91*  CO2 '28 24 25 25 26  '$ ANIONGAP '10 13 15 15 12  '$ GLUCOSE 185* 154* 193* 158* 141*  BUN 28* 41* 23 38* 47*  CREATININE 5.06* 6.84* 4.45* 6.28* 7.46*  ALBUMIN 2.0* 2.1* 2.4* 2.2* 2.4*  CALCIUM 9.1 9.3 9.2 9.6 9.7    Radiology Reports No results found.    Signature  -   Lala Lund M.D on 06/06/2022 at 9:40 AM   -  To page go to [email or web address]

## 2022-06-06 NOTE — Procedures (Signed)
Patient seen and examined on Hemodialysis. The procedure was supervised and I have made appropriate changes. BP (S) (!) 124/48 Comment: MAp 71  Pulse 80   Temp 98.6 F (37 C)   Resp (!) 27   Ht 6' (1.829 m)   Wt 100.9 kg   SpO2 100%   BMI 30.17 kg/m   QB 400 mL min via R IJ TDC, UF goal 3.4L  Tolerating treatment without complaints at this time.   Madelon Lips MD [place] Pgr [phone] 10:06 AM

## 2022-06-06 NOTE — TOC Progression Note (Addendum)
Transition of Care (TOC) - Progression Note    Patient Details  Name: [name]. MRN: [id number] Date of Birth: [birth year]  Transition of Care [facility]) CM/SW [place], [place] Phone Number: 06/06/2022, 2:36 PM  Clinical Narrative:     CSW met with pt and provided SNF bed offers and medicare star ratings. Pt chooses [facility]. CSW awaiting confirmation from [facility] rehab for DC tomorrow.   1610: [facility] confirmed they can admit pt tomorrow and that they can accommodate pt's OPHD schedule.   Expected Discharge Plan: Skilled Nursing Facility Barriers to Discharge: Continued Medical Work up, SNF Pending bed offer  Expected Discharge Plan and Services In-house Referral: Clinical Social Work     Living arrangements for the past 2 months: Apartment                                       Social Determinants of Health (SDOH) Interventions [place]: Food Insecurity Present (05/25/2022)  Housing: Low Risk  (05/25/2022)  Transportation Needs: No Transportation Needs (05/25/2022)  Utilities: Not At Risk (05/25/2022)  Tobacco Use: Medium Risk (06/01/2022)    Readmission Risk Interventions     No data to display

## 2022-06-06 NOTE — Progress Notes (Signed)
Navigator contacted [place] last week to make clinic aware of pt's need for iv cefazolin with HD at d/c. Pharmacy order faxed to clinic last week and received per [facility]. Spoke to clinic today who confirms that clinic has med in-stock and can provide to pt at d/c. Case discussed with CSW as well.   Melven Sartorius Renal Navigator [phone]

## 2022-06-06 NOTE — Progress Notes (Signed)
Received patient in bed,alert and oriented x 3. Denies pain.Vitals were stable.  Access used : Right HD catheter that worked well.Dressing on date.  Duration of treatment:  3.5 hours out of 4 hours prescribed.  Medicine given :None.  Fluid removed:1,200 cc out of 3,000 cc UF prescribed.  Hemodialysis issue/comment:Unable to tolerate his HD treatment.Unmet UF goal of 3 liters due to patient soft blood pressure during the last hour of his treatment.Had to give 200 cc bolus NS to maintained a decent blood pressure.. Patient quit his HD treatment on his last 30 minutes of treatment.Patient s restless? Uncomfortable. all throughout the time of his treatment.  Hand off to the patient's nurse.

## 2022-06-07 DIAGNOSIS — R7881 Bacteremia: Secondary | ICD-10-CM | POA: Diagnosis not present

## 2022-06-07 LAB — GLUCOSE, CAPILLARY: Glucose-Capillary: 95 mg/dL (ref 70–99)

## 2022-06-07 MED ORDER — FERROUS SULFATE 325 (65 FE) MG PO TABS: 325.0000 mg | ORAL_TABLET | Freq: Two times a day (BID) | ORAL | 3 refills | Status: DC

## 2022-06-07 MED ORDER — PANTOPRAZOLE SODIUM 40 MG PO TBEC: 40.0000 mg | DELAYED_RELEASE_TABLET | Freq: Every day | ORAL | Status: DC

## 2022-06-07 MED ORDER — INSULIN ASPART 100 UNIT/ML FLEXPEN: PEN_INJECTOR | SUBCUTANEOUS | 0 refills | Status: DC

## 2022-06-07 MED ORDER — CEFAZOLIN IV (FOR PTA / DISCHARGE USE ONLY): 2.0000 g | INTRAVENOUS | 0 refills | Status: DC

## 2022-06-07 MED ORDER — INSULIN GLARGINE-YFGN 100 UNIT/ML ~~LOC~~ SOLN: 10.0000 [IU] | Freq: Every day | SUBCUTANEOUS | 0 refills | Status: DC

## 2022-06-07 MED ORDER — RIFAMPIN 300 MG PO CAPS: 300.0000 mg | ORAL_CAPSULE | Freq: Two times a day (BID) | ORAL | 0 refills | Status: DC

## 2022-06-07 MED ORDER — HYDROCODONE-ACETAMINOPHEN 5-325 MG PO TABS: 1.0000 | ORAL_TABLET | Freq: Four times a day (QID) | ORAL | 0 refills | Status: DC | PRN

## 2022-06-07 MED ORDER — CYANOCOBALAMIN 1000 MCG PO TABS: 1000.0000 ug | ORAL_TABLET | Freq: Every day | ORAL | Status: DC

## 2022-06-07 MED ORDER — CARVEDILOL 3.125 MG PO TABS: 3.1250 mg | ORAL_TABLET | Freq: Two times a day (BID) | ORAL | Status: DC

## 2022-06-07 MED ORDER — DOCUSATE SODIUM 100 MG PO CAPS: 200.0000 mg | ORAL_CAPSULE | Freq: Two times a day (BID) | ORAL | 0 refills | Status: DC

## 2022-06-07 NOTE — Progress Notes (Signed)
Report given to Chad at [facility]. Patient transported via stretcher by [place]. PIV was removed. AVS summary provided to patient and PTAR. Education resolved.

## 2022-06-07 NOTE — Progress Notes (Signed)
[facility] Progress Note   Assessment/ Plan:   Center: [facility]  on MWF . EDW 116.5kg, Bath 2K/2.5Ca,  Time 4 hours ,Access RIJ TDC  BFR 400 DFR 500 UF profile 2    Meds: mircera 75 mcg every 2 weeks (due again on 2/26) Calcitriol 2.25 mcg three times a week Sensipar 30 mg in center three times a week Heparin 2000 units IVP then 1000 units/hr.    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  Status post HD catheter removal by IR on 2/23 for line holiday.  New catheter placed on 2/26.  No surgical intervention per CT surgery. Per Pharmacist: plan for Cefazolin 2g/HD-MWF and rifampin 300 mg twice daily thru 07/08/22.  It was faxed to his home HD unit.   # ESRD on HD, MWF: New tunneled HD catheter placed on 05/30/2022 by IR. HD on schedule 06/06/22, wll place orders here for 06/08/22 in case still here.   # Anemia: Received Aranesp on 2/26, continue with.Monitor hemoglobin.   # Secondary hyperparathyroidism: Continue calcitriol and Sensipar.  I will lower PhosLo dose because of low phosphorus level.  Monitor lab.   # HTN/volume: Blood pressure low therefore dc amlodipine and clonidine.  UF with HD.   # Hyponatremia, hypervolemic: Continue fluid restriction and ultrafiltration during HD.  #Psoriasis- will order triamcinolone cream  Subjective:    For d/c today.  No complaints   Objective:   BP (!) 166/70 (BP Location: Left Arm)   Pulse 81   Temp 98 F (36.7 C) (Oral)   Resp 18   Ht 6' (1.829 m)   Wt 99.9 kg   SpO2 100%   BMI 29.87 kg/m   Physical Exam: Gen:NAD, lying in bed CVS: RRR Resp: clear, on O2 Abd: obese, NABS Ext: 2+ LLE edema, trace RLE edema ACCESS: R IJ TDC SKIN: + mutliple psoriasis patches   Labs: BMET Recent Labs  Lab 06/01/22 0254 06/02/22 0349 06/03/22 0839 06/04/22 0342 06/05/22 1130 06/06/22 0654  NA 132* 132* 130* 132* 132* 129*  K 4.2 3.9  4.2 4.0 4.3 4.6  CL 94* 94* 93* 92* 92* 91*  CO2 '25 28 24 25 25 26  '$ GLUCOSE 149* 185* 154* 193* 158* 141*  BUN 42* 28* 41* 23 38* 47*  CREATININE 6.52* 5.06* 6.84* 4.45* 6.28* 7.46*  CALCIUM 9.2 9.1 9.3 9.2 9.6 9.7  PHOS 4.7* 3.7 4.3 2.5 3.8 3.8   CBC Recent Labs  Lab 06/03/22 0839 06/04/22 0342 06/05/22 1130 06/06/22 0654  WBC 13.3* 12.7* 12.1* 13.4*  NEUTROABS 10.7* 10.8* 9.5* 10.9*  HGB 7.1* 7.3* 6.8* 8.8*  HCT 22.1* 23.5* 20.8* 26.5*  MCV 94.8 96.3 94.1 91.4  PLT 176 201 188 228      Medications:     aspirin  81 mg Oral QHS   baclofen  2.5 mg Oral BID   brimonidine  1 drop Right Eye TID   calcitRIOL  2.25 mcg Oral Q M,W,F   calcium acetate  667 mg Oral TID WC   carvedilol  3.125 mg Oral 2 times per day on Sun Tue Thu Sat   carvedilol  3.125 mg Oral Q M,W,F-2000   Chlorhexidine Gluconate Cloth  6 each Topical Q0600   Chlorhexidine Gluconate Cloth  6 each Topical Q0600   cinacalcet  30 mg Oral Q M,W,F   clopidogrel  75 mg Oral QHS   vitamin B-12  1,000 mcg Oral Daily  darbepoetin (ARANESP) injection - NON-DIALYSIS  150 mcg Subcutaneous Q Mon-1800   docusate sodium  200 mg Oral BID   ferrous sulfate  325 mg Oral TID WC   folic acid  1 mg Oral QHS   gabapentin  300 mg Oral QHS   heparin  5,000 Units Subcutaneous Q8H   insulin aspart  0-6 Units Subcutaneous TID WC   insulin aspart  3 Units Subcutaneous TID WC   insulin glargine-yfgn  10 Units Subcutaneous QHS   pantoprazole  40 mg Oral BID   polyethylene glycol  17 g Oral BID   rifampin  300 mg Oral Q12H   rosuvastatin  20 mg Oral QHS   sodium chloride flush  10-40 mL Intracatheter Q12H   sodium chloride flush  3 mL Intravenous Q12H     Madelon Lips, MD 06/07/2022, 11:46 AM

## 2022-06-07 NOTE — Progress Notes (Signed)
Pt to d/c to snf today. Contacted [facility] and spoke to [place]. Clinic aware pt to d/c to snf today and will resume care tomorrow. D/C summary and yesterday's renal notes faxed to clinic this morning for continuation of care. Clinic aware of pt's need for iv cefazolin with HD and has has med available. D/C summary documented need for this abx as well.   Melven Sartorius Renal Navigator [phone]

## 2022-06-07 NOTE — TOC Transition Note (Signed)
Transition of Care [facility] - CM/SW Discharge Note   Patient Details  Name: [name]. MRN: [id number] Date of Birth: 03-13-1956  Transition of Care Procedure [facility]) CM/SW Contact:  Milinda Antis, [place] Phone Number: 06/07/2022, 9:01 AM   Clinical Narrative:    Patient will DC to: [facility] date:06/07/2022 Family notified: patient alert and oriented Transport by: Corey Harold   Per MD patient ready for DC to SNF. RN to call report prior to discharge [phone] room 102). RN, patient, and facility notified of DC. Discharge Summary sent to facility. DC packet on chart. Ambulance transport will be  requested for patient.   CSW will sign off for now as social work intervention is no longer needed. Please consult [place] again if new needs arise.    Final next level of care: Skilled Nursing Facility Barriers to Discharge: Barriers Resolved   Patient Goals and CMS Choice CMS Medicare.gov Compare Post Acute Care list provided to:: Patient Choice offered to / list presented to : Patient  Discharge Placement                Patient chooses bed at:  [facility]) Patient to be transferred to facility by: [place] Name of family member notified: patient alert and oriented Patient and family notified of of transfer: 06/07/22  Discharge Plan and Services Additional resources added to the After Visit Summary for   In-house Referral: Clinical Social Work                                   Social Determinants of Health ([place]) Interventions SDOH Screenings   Food Insecurity: Food Insecurity Present (05/25/2022)  Housing: Low Risk  (05/25/2022)  Transportation Needs: No Transportation Needs (05/25/2022)  Utilities: Not At Risk (05/25/2022)  Tobacco Use: Medium Risk (06/01/2022)     Readmission Risk Interventions     No data to display

## 2022-06-07 NOTE — Discharge Summary (Addendum)
[name]. [id number] DOB: Apr 07, 1955 DOA: 05/24/2022  PCP: [name] Gravel, MD  Admit date: 05/24/2022  Discharge date: 06/07/2022  Admitted From: Home   Disposition:  SNF   Recommendations for Outpatient Follow-up:   Follow up with PCP in 1-2 weeks  PCP Please obtain BMP/CBC, 2 view CXR in 1week,  (see Discharge instructions)   PCP Please follow up on the following pending results: Needs follow-up with ID in 1 to 2 weeks postdischarge.  Dr. Laurice Record   Home Health: None   Equipment/Devices: None  Consultations: ID, Cardiology, EP, cardiothoracic surgery, nephrology Discharge Condition: Stable    CODE STATUS: Full    Diet Recommendation: Renal, low carbohydrate diet.  1.2 L fluid restriction per day.  Check CBGs q. [place].  Chief Complaint  Patient presents with   Shortness of Breath     Brief history of present illness from the day of admission and additional interim summary    67 yom w/ hypertension,IDDM,PAD,anemia, pacemaker, and ESRD on HD, sent from dialysis for evaluation of bacteremia.   Patient severely fatigued for the past 1 week, missed dialysis 2/20 for this reason, and was contacted by dialysis personnel, informed that he had an infection in his blood, and was directed to the ED. No fever, chills, cough, dysuria, abdominal pain, vomiting, diarrhea, neck stiffness, or new rash. In [id number] and saturating mid 90s on room air. CT C/A/P notable for mild skin thickening and edema of the abdominal wall without gas or abscess, left renal lesion, right greater than left gynecomastia, and other nonacute findings.     He was admitted to the hospital for bacteremia in the presence of dialysis tunneled catheter, AICD, seen by nephrology, ID and EP.  Currently on IV antibiotics awaiting further  workup.  He was transferred under my care on 05/30/2022 on day 5 of hospital stay.                                                                 Hospital Course   Bacteremia with HD center blood cx growing S [place] with history of HD catheter and AICD device, TEE suspicious for aortic root abscess, AICD vegetation, tricuspid valve endocarditis -  Sent by HD to the ED due to bacteremia from blood cultures on 2/15, underwent echocardiogram showing -    He is on IV antibiotics per ID, underwent tunneled catheter removal by IR on 05/27/2022, new tunneled right IJ dialysis catheter was placed on 05/30/2022 after line holidays.  On IV Ancef, repeat cultures thus far negative appears nontoxic, TEE as above. Cardiology/EP and cardiothoracic surgery both consulted.  Per EP not a candidate for AICD removal due to complex anatomy and underlying medical issues, per cardiothoracic surgery not a surgical candidate.  Continue medical treatment.  Long-term prognosis  is guarded.  Will continue antibiotics as directed by ID, look for SNF bed.   Per ID - Cefazolin with HD 6 weeks plus rifampin 300 mg PO bid for 6 weeks (EOT 07/08/22) .     Class 1 obesity due to excess calories with body mass index (BMI) of 33.0 to 33.9 in adult -Body mass index is 33.79 kg/m..  -Weight loss should be encouraged -Outpatient PCP monitoring.   Gynecomastia -Right more than left  -Incidentally noted on  CT  -PCP to monitor and address likely due to ESRD.     Kidney lesion, native, left -Incidentally noted on CT -Outpatient MRI recommended    ESRD (end stage renal disease) on dialysis ([place]) -Patient on chronic MWF HD -Nephrology prn order set utilized -Due for HD again tomorrow post-catheter placement   Anemia -Associated with ESRD -However H&H serially dropping, no [name] blood in stool, stable DIC panel, LDH , Haptoglobin, placed on PPI twice daily and monitor H&H closely.  Type screen done, no signs of brisk ongoing  bleeding.  Patient GI follow-up recommended.   Essential hypertension - blood pressure medications adjusted as blood pressure well on present regimen continue to monitor and adjust at SNF.   Hyperlipidemia -Continue home regimen   Constipation.  Placed on bowel regimen.  Monitor.  Abdominal exam benign.  Now resolved with bowel regimen.   DM type 2 with peripheral vascular complications (HCC) -123456 5.6, good control -Continue glargine -Cover with very sensitive-scale SSI, check CBGs q. ACH S and adjust at SNF as needed.    Discharge diagnosis     Principal Problem:   Bacteremia Active Problems:   Hyperlipidemia   Essential hypertension   DM (diabetes mellitus), type 2 with peripheral vascular complications (HCC)   Anemia   ESRD (end stage renal disease) on dialysis (HCC)   Kidney lesion, native, left   Gynecomastia   Class 1 obesity due to excess calories with body mass index (BMI) of 33.0 to 33.9 in adult    Discharge instructions    Discharge Instructions     Discharge instructions   Complete by: As directed    Follow with Primary MD [name] Gravel, MD in 7 days   Get CBC, CMP, 2 view Chest X ray -  checked by SNF MD  in 1 week  Activity: As tolerated with Full fall precautions use walker/cane & assistance as needed  Disposition SNF  Diet: Renal, low carbohydrate diet.  1.2 L fluid restriction per day.  Check CBGs q. [place].  Special Instructions: If you have smoked or chewed Tobacco  in the last 2 yrs please stop smoking, stop any regular Alcohol  and or any Recreational drug use.  On your next visit with your primary care physician please Get Medicines reviewed and adjusted.  Please request your Prim.MD to go over all Hospital Tests and Procedure/Radiological results at the follow up, please get all Hospital records sent to your Prim MD by signing hospital release before you go home.  If you experience worsening of your admission symptoms, develop shortness of  breath, life threatening emergency, suicidal or homicidal thoughts you must seek medical attention immediately by calling 911 or calling your MD immediately  if symptoms less severe.  You Must read complete instructions/literature along with all the possible adverse reactions/side effects for all the Medicines you take and that have been prescribed to you. Take any new Medicines after you have completely understood and accpet all the possible adverse reactions/side effects.  Home infusion instructions   Complete by: As directed    Instructions: Flushing of vascular access device: 0.9% NaCl pre/post medication administration and prn patency; Heparin 100 u/ml, 42m for implanted ports and Heparin 10u/ml, 511mfor all other central venous catheters.   Increase activity slowly   Complete by: As directed    No wound care   Complete by: As directed        Discharge Medications   Allergies as of 06/07/2022       Reactions   Contrast Media [iodinated Contrast Media] Hives, Other (See Comments)   Happened "in the 80's"   Other Other (See Comments)   "Transfer Dye" = "Makes me tired"   Acetazolamide Anxiety, Other (See Comments)   Jittery, odd feeling (hyper feeling)        Medication List     STOP taking these medications    amLODipine 10 MG tablet Commonly known as: NORVASC   cloNIDine 0.1 MG tablet Commonly known as: CATAPRES   furosemide 20 MG tablet Commonly known as: LASIX   hydrALAZINE 25 MG tablet Commonly known as: APRESOLINE   insulin lispro 100 UNIT/ML KiwkPen Commonly known as: HUMALOG   Lantus SoloStar 100 UNIT/ML Solostar Pen Generic drug: insulin glargine       TAKE these medications    aspirin 81 MG tablet Take 81 mg by mouth at bedtime.   brimonidine 0.2 % ophthalmic solution Commonly known as: ALPHAGAN Place 1 drop into the right eye 3 (three) times daily.   calcium acetate 667 MG capsule Commonly known as: PHOSLO Take 667-1,334 mg by mouth See  admin instructions. Take 1,334 mg by mouth three times a day with meals and 667 mg with each snack   carvedilol 3.125 MG tablet Commonly known as: COREG Take 1 tablet (3.125 mg total) by mouth 2 (two) times daily with a meal. What changed:  medication strength how much to take how to take this when to take this additional instructions   ceFAZolin  IVPB Commonly known as: ANCEF Inject 2 g into the vein every Monday, Wednesday, and Friday with hemodialysis. 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   clopidogrel 75 MG tablet Commonly known as: PLAVIX Take 75 mg by mouth at bedtime.   cyanocobalamin 1000 MCG tablet Take 1 tablet (1,000 mcg total) by mouth daily.   docusate sodium 100 MG capsule Commonly known as: COLACE Take 2 capsules (200 mg total) by mouth 2 (two) times daily.   ferrous sulfate 325 (65 FE) MG tablet Take 1 tablet (325 mg total) by mouth 2 (two) times daily with a meal.   fluticasone 50 MCG/ACT nasal spray Commonly known as: FLONASE Place 1 spray into both nostrils at bedtime as needed for allergies or rhinitis.   folic acid 1 MG tablet Commonly known as: FOLVITE Take 1 mg by mouth at bedtime.   gabapentin 300 MG capsule Commonly known as: NEURONTIN TAKE 1 CAPSULE(300 MG) BY MOUTH DAILY What changed: See the new instructions.   HYDROcodone-acetaminophen 5-325 MG tablet Commonly known as: NORCO/VICODIN Take 1 tablet by mouth every 6 (six) hours  as needed for moderate pain. Take one tab po q 4 hrs prn pain What changed:  how much to take how to take  this when to take this reasons to take this   icosapent Ethyl 1 g capsule Commonly known as: VASCEPA Take 2 g by mouth 2 (two) times daily.   insulin aspart 100 UNIT/ML FlexPen Commonly known as: NOVOLOG Before each meal 3 times a day, 140-199 - 2 units, 200-250 - 4 units, 251-299 - 6 units,  300-349 - 8 units,  350 or above 10 units.   insulin glargine-yfgn 100 UNIT/ML injection Commonly known as: SEMGLEE Inject 0.1 mLs (10 Units total) into the skin at bedtime.   methocarbamol 500 MG tablet Commonly known as: ROBAXIN Take 1 tablet (500 mg total) by mouth 3 (three) times daily.   pantoprazole 40 MG tablet Commonly known as: PROTONIX Take 1 tablet (40 mg total) by mouth daily.   rifampin 300 MG capsule Commonly known as: Rifadin Take 1 capsule (300 mg total) by mouth 2 (two) times daily.   rosuvastatin 20 MG tablet Commonly known as: CRESTOR Take 20 mg by mouth at bedtime.   triamcinolone cream 0.1 % Commonly known as: KENALOG Apply 1 Application topically 2 (two) times daily.               Home Infusion Instuctions  (From admission, onward)           Start     Ordered   06/07/22 0000  Home infusion instructions       Question:  Instructions  Answer:  Flushing of vascular access device: 0.9% NaCl pre/post medication administration and prn patency; Heparin 100 u/ml, 65m for implanted ports and Heparin 10u/ml, 522mfor all other central venous catheters.   06/07/22 0820              Major procedures and Radiology Reports - PLEASE review detailed and final reports thoroughly  -      ECHO TEE  Result Date: 05/31/2022    TRANSESOPHOGEAL ECHO REPORT   Patient Name:   [name] CiolliDate of Exam: 05/31/2022 Medical Rec #:  [id number]       Height:       72.0 in Accession #:    [id number]      Weight:       249.1 lb Date of Birth:  1/01-24-1957       BSA:          2.339 m Patient Age:    [age]ears          BP:           111/46 mmHg Patient Gender: M                  HR:           72 bpm. Exam Location:  Inpatient Procedure: 3D Echo, Color Doppler, Cardiac Doppler and Transesophageal Echo Indications:    Bacteremia  History:        Patient has prior history of Echocardiogram examinations, most                 recent 05/27/2022. Cardiomyopathy and CHF, Defibrillator, Stroke                 and TIA, Signs/Symptoms:Dyspnea; Risk Factors:Current Smoker,                 Hypertension, Dyslipidemia and Diabetes. CKD.  Sonographer:  Eartha Inch Referring Phys: [id number] [place]: After discussion of the risks and benefits of a TEE, an informed consent was obtained from the patient. The transesophogeal probe was passed without difficulty through the esophogus of the patient. Imaged were obtained with the patient in a left lateral decubitus position. Sedation performed by different physician. The patient was monitored while under deep sedation. Anesthestetic sedation was provided intravenously by Anesthesiology: 211.'31mg'$  of Propofol. Image quality was good. The patient's vital signs; including heart rate, blood pressure, and oxygen saturation; remained stable throughout the procedure. The patient developed no complications during the procedure.  IMPRESSIONS  1. 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 Perforation of left coronary cusp aortic valve leaflet. The aortic valve is tricuspid. Aortic valve regurgitation is moderate to severe. Mild aortic stenosis.  3. Large vegetation measuring 1.5 cm x 1.5 cm appears attached to both ICD lead and septal lealfet of tricuspid valve on atrial side. Mild tricuspid regurgitation. There is another vegetation measuring 1.3 cm x 0.4 cm attached to ICD lead in right atrium.  4. Left ventricular ejection fraction, by estimation, is 45 to 50%. The left ventricle has mildly decreased function.  5. Right ventricular systolic function is normal. The right ventricular size is  mildly enlarged  6. The mitral valve is degenerative. Trivial mitral valve regurgitation.  7. No left atrial/left atrial appendage thrombus was detected. FINDINGS  Left Ventricle: Left ventricular ejection fraction, by estimation, is 45 to 50%. The left ventricle has mildly decreased function. The left ventricular internal cavity size was normal in size. Right Ventricle: The right ventricular size is mildly enlarged. No increase in right ventricular wall thickness. Right ventricular systolic function is normal. Left Atrium: Left atrial size was normal in size. No left atrial/left atrial appendage thrombus was detected. Right Atrium: Right atrial size was normal in size. Pericardium: There is no evidence of pericardial effusion. Mitral Valve: The mitral valve is degenerative in appearance. Trivial mitral valve regurgitation. Tricuspid Valve: The tricuspid valve is abnormal. Tricuspid valve regurgitation is mild. Aortic Valve: The aortic valve is tricuspid. Aortic valve regurgitation is moderate to severe. Aortic regurgitation PHT measures 392 msec. Mild aortic stenosis is present. Aortic valve mean gradient measures 10.0 mmHg. Aortic valve peak gradient measures  19.9 mmHg. Aortic valve area, by VTI measures 1.81 cm. Pulmonic Valve: The pulmonic valve was grossly normal. Pulmonic valve regurgitation is trivial. Aorta: The aortic root is normal in size and structure. IAS/Shunts: No atrial level shunt detected by color flow Doppler. Additional Comments: Spectral Doppler performed. LEFT VENTRICLE PLAX 2D LVOT diam:     2.10 cm LV SV:         85 LV SV Index:   36 LVOT Area:     3.46 cm  AORTIC VALVE AV Area (Vmax):    1.83 cm AV Area (Vmean):   1.70 cm AV Area (VTI):     1.81 cm AV Vmax:           223.00 cm/s AV Vmean:          150.000 cm/s AV VTI:            0.468 m AV Peak Grad:      19.9 mmHg AV Mean Grad:      10.0 mmHg LVOT Vmax:         118.00 cm/s LVOT Vmean:        73.600 cm/s LVOT VTI:  0.245 m  LVOT/AV VTI ratio: 0.52 AI PHT:            392 msec  AORTA Ao Root diam: 3.70 cm  SHUNTS Systemic VTI:  0.24 m Systemic Diam: 2.10 cm Oswaldo Milian MD Electronically signed by Oswaldo Milian MD Signature Date/Time: 05/31/2022/1:43:49 PM    Final    IR US Guide Vasc Access Right  Result Date: 05/30/2022 CLINICAL DATA:  End-stage renal disease and removal of previously placed right IJ tunneled hemodialysis catheter on 05/27/2022 due to bacteremia. The patient has now been satisfactory treated for bacteremia and requires a new tunneled hemodialysis catheter for continued hemodialysis needs. EXAM: TUNNELED CENTRAL VENOUS HEMODIALYSIS CATHETER PLACEMENT WITH ULTRASOUND AND FLUOROSCOPIC GUIDANCE ANESTHESIA/SEDATION: Moderate (conscious) sedation was employed during this procedure. A total of Versed 1.0 mg and Fentanyl 50 mcg was administered intravenously by radiology nursing. Moderate Sedation Time: 27 minutes. The patient's level of consciousness and vital signs were monitored continuously by radiology nursing throughout the procedure under my direct supervision. MEDICATIONS: 2 g IV Ancef. FLUOROSCOPY: 5 minutes and 30 seconds.  130 mGy. PROCEDURE: The procedure, risks, benefits, and alternatives were explained to the patient. Questions regarding the procedure were encouraged and answered. The patient understands and consents to the procedure. A timeout was performed prior to initiating the procedure. A time-out was performed prior to initiating the procedure. The right neck and chest were prepped with chlorhexidine in a sterile fashion, and a sterile drape was applied covering the operative field. Maximum barrier sterile technique with sterile gowns and gloves were used for the procedure. Local anesthesia was provided with 1% lidocaine. Ultrasound was used to confirm patency of the right internal jugular vein. A permanent ultrasound image was saved and recorded. After creating a small venotomy incision,  a 21 gauge needle was advanced into the right internal jugular vein under direct, real-time ultrasound guidance. Ultrasound image documentation was performed. After securing guidewire access, an 8 Fr dilator was placed. A J-wire was kinked to measure appropriate catheter length. A Palindrome tunneled hemodialysis catheter measuring 19 cm from tip to cuff was chosen for placement. This was tunneled in a retrograde fashion from the chest wall to the venotomy incision. At the venotomy, serial dilatation was performed and a 15 Fr peel-away sheath was placed over a guidewire. The catheter was then placed through the sheath and the sheath removed. Final catheter positioning was confirmed and documented with a fluoroscopic spot image. The catheter was aspirated, flushed with saline, and injected with appropriate volume heparin dwells. The venotomy incision was closed with subcuticular 4-0 Vicryl. Dermabond was applied to the incision. The catheter exit site was secured with 0-Prolene retention sutures. COMPLICATIONS: None.  No pneumothorax. FINDINGS: After catheter placement, the tip lies in the right atrium. The catheter aspirates normally and is ready for immediate use. IMPRESSION: Placement of tunneled hemodialysis catheter via the right internal jugular vein. The catheter tip lies in the right atrium. The catheter is ready for immediate use. Electronically Signed   By: Aletta Edouard M.D.   On: 05/30/2022 13:25   IR Fluoro Guide CV Line Right  Result Date: 05/30/2022 CLINICAL DATA:  End-stage renal disease and removal of previously placed right IJ tunneled hemodialysis catheter on 05/27/2022 due to bacteremia. The patient has now been satisfactory treated for bacteremia and requires a new tunneled hemodialysis catheter for continued hemodialysis needs. EXAM: TUNNELED CENTRAL VENOUS HEMODIALYSIS CATHETER PLACEMENT WITH ULTRASOUND AND FLUOROSCOPIC GUIDANCE ANESTHESIA/SEDATION: Moderate (conscious) sedation was  employed during this procedure.  A total of Versed 1.0 mg and Fentanyl 50 mcg was administered intravenously by radiology nursing. Moderate Sedation Time: 27 minutes. The patient's level of consciousness and vital signs were monitored continuously by radiology nursing throughout the procedure under my direct supervision. MEDICATIONS: 2 g IV Ancef. FLUOROSCOPY: 5 minutes and 30 seconds.  130 mGy. PROCEDURE: The procedure, risks, benefits, and alternatives were explained to the patient. Questions regarding the procedure were encouraged and answered. The patient understands and consents to the procedure. A timeout was performed prior to initiating the procedure. A time-out was performed prior to initiating the procedure. The right neck and chest were prepped with chlorhexidine in a sterile fashion, and a sterile drape was applied covering the operative field. Maximum barrier sterile technique with sterile gowns and gloves were used for the procedure. Local anesthesia was provided with 1% lidocaine. Ultrasound was used to confirm patency of the right internal jugular vein. A permanent ultrasound image was saved and recorded. After creating a small venotomy incision, a 21 gauge needle was advanced into the right internal jugular vein under direct, real-time ultrasound guidance. Ultrasound image documentation was performed. After securing guidewire access, an 8 Fr dilator was placed. A J-wire was kinked to measure appropriate catheter length. A Palindrome tunneled hemodialysis catheter measuring 19 cm from tip to cuff was chosen for placement. This was tunneled in a retrograde fashion from the chest wall to the venotomy incision. At the venotomy, serial dilatation was performed and a 15 Fr peel-away sheath was placed over a guidewire. The catheter was then placed through the sheath and the sheath removed. Final catheter positioning was confirmed and documented with a fluoroscopic spot image. The catheter was aspirated,  flushed with saline, and injected with appropriate volume heparin dwells. The venotomy incision was closed with subcuticular 4-0 Vicryl. Dermabond was applied to the incision. The catheter exit site was secured with 0-Prolene retention sutures. COMPLICATIONS: None.  No pneumothorax. FINDINGS: After catheter placement, the tip lies in the right atrium. The catheter aspirates normally and is ready for immediate use. IMPRESSION: Placement of tunneled hemodialysis catheter via the right internal jugular vein. The catheter tip lies in the right atrium. The catheter is ready for immediate use. Electronically Signed   By: Aletta Edouard M.D.   On: 05/30/2022 13:25   ECHOCARDIOGRAM COMPLETE  Result Date: 05/27/2022    ECHOCARDIOGRAM REPORT   Patient Name:   [name]. Date of Exam: 05/27/2022 Medical Rec #:  [id number]         Height:       72.0 in Accession #:    [id number]        Weight:       249.1 lb Date of Birth:  June 14, 1955         BSA:          2.339 m Patient Age:    18 years          BP:           140/62 mmHg Patient Gender: M                 HR:           83 bpm. Exam Location:  [facility] Procedure: 2D Echo, Cardiac Doppler and Color Doppler Indications:    Bacteremia R78.81  History:        Patient has prior history of Echocardiogram examinations, most  recent 06/19/2016. CHF and Cardiomyopathy, TIA; Risk                 Factors:Dyslipidemia, Diabetes and Hypertension. ESRD (end stage                 renal disease) on dialysis, COVID-19 virus infection, AICD                 (automatic cardioverter/defibrillator) present (From Hx).  Sonographer:    Alvino Chapel RCS Referring Phys: [id number] [place] IMPRESSIONS  1. Left ventricular ejection fraction, by estimation, is 45 to 50%. The left ventricle has mildly decreased function. The left ventricle demonstrates regional wall motion abnormalities (see scoring diagram/findings for description). There is moderate concentric left ventricular  hypertrophy. Left ventricular diastolic parameters are consistent with Grade I diastolic dysfunction (impaired relaxation).  2. Right ventricular systolic function is normal. The right ventricular size is normal. There is normal pulmonary artery systolic pressure.  3. The mitral valve is degenerative. Trivial mitral valve regurgitation.  4. The aortic valve is tricuspid. There is mild calcification of the aortic valve. There is mild thickening of the aortic valve. Aortic valve regurgitation is not visualized. Mild aortic valve stenosis. Aortic valve mean gradient measures 11.0 mmHg.  5. Aortic dilatation noted. There is mild dilatation of the aortic root, measuring 41 mm.  6. The inferior vena cava is normal in size with greater than 50% respiratory variability, suggesting right atrial pressure of 3 mmHg.  7. Device lead present in RA/RV with thickening and echodensity (approximately 1cm x 2cm) noted on atrial side of tricuspid valve concerning for vegetation in the setting of known bacteremia. TEE indicated for further evaluation as lead extraction may need to be considered. Comparison(s): Prior images unable to be directly viewed. FINDINGS  Left Ventricle: Left ventricular ejection fraction, by estimation, is 45 to 50%. The left ventricle has mildly decreased function. The left ventricle demonstrates regional wall motion abnormalities. Definity contrast agent was given IV to delineate the left ventricular endocardial borders. The left ventricular internal cavity size was normal in size. There is moderate concentric left ventricular hypertrophy. Left ventricular diastolic parameters are consistent with Grade I diastolic dysfunction (impaired relaxation).  LV Wall Scoring: The basal inferolateral segment, basal inferior segment, and basal inferoseptal segment are hypokinetic. The entire anterior wall, antero-lateral wall, mid and distal lateral wall, entire anterior septum, entire apex, mid and distal inferior wall,  and mid inferoseptal segment are normal. Right Ventricle: The right ventricular size is normal. No increase in right ventricular wall thickness. Right ventricular systolic function is normal. There is normal pulmonary artery systolic pressure. The tricuspid regurgitant velocity is 2.20 m/s, and  with an assumed right atrial pressure of 3 mmHg, the estimated right ventricular systolic pressure is [id number] mmHg. Left Atrium: Left atrial size was normal in size. Right Atrium: Right atrial size was normal in size. Pericardium: There is no evidence of pericardial effusion. Mitral Valve: The mitral valve is degenerative in appearance. There is mild thickening of the mitral valve leaflet(s). There is mild calcification of the mitral valve leaflet(s). Trivial mitral valve regurgitation. Tricuspid Valve: The tricuspid valve is grossly normal. Tricuspid valve regurgitation is mild. Aortic Valve: The aortic valve is tricuspid. There is mild calcification of the aortic valve. There is mild thickening of the aortic valve. There is mild to moderate aortic valve annular calcification. Aortic valve regurgitation is not visualized. Mild aortic stenosis is present. Aortic valve mean gradient measures 11.0 mmHg. Aortic valve peak  gradient measures 22.2 mmHg. Aortic valve area, by VTI measures 1.67 cm. Pulmonic Valve: The pulmonic valve was grossly normal. Pulmonic valve regurgitation is trivial. Aorta: Aortic dilatation noted. There is mild dilatation of the aortic root, measuring 41 mm. Venous: The inferior vena cava is normal in size with greater than 50% respiratory variability, suggesting right atrial pressure of 3 mmHg. IAS/Shunts: No atrial level shunt detected by color flow Doppler.  LEFT VENTRICLE PLAX 2D LVIDd:         5.20 cm   Diastology LVIDs:         3.30 cm   LV e' medial:    3.26 cm/s LV PW:         1.40 cm   LV E/e' medial:  27.2 LV IVS:        1.30 cm   LV e' lateral:   9.68 cm/s LVOT diam:     2.20 cm   LV E/e' lateral:  9.2 LV SV:         76 LV SV Index:   33 LVOT Area:     3.80 cm  RIGHT VENTRICLE RV S prime:     15.10 cm/s TAPSE (M-mode): 2.0 cm LEFT ATRIUM             Index        RIGHT ATRIUM           Index LA diam:        4.00 cm 1.71 cm/m   RA Area:     20.60 cm LA Vol (A2C):   64.4 ml 27.53 ml/m  RA Volume:   67.70 ml  28.94 ml/m LA Vol (A4C):   67.5 ml 28.86 ml/m LA Biplane Vol: 71.7 ml 30.65 ml/m  AORTIC VALVE AV Area (Vmax):    1.57 cm AV Area (Vmean):   1.60 cm AV Area (VTI):     1.67 cm AV Vmax:           235.33 cm/s AV Vmean:          154.667 cm/s AV VTI:            0.454 m AV Peak Grad:      22.2 mmHg AV Mean Grad:      11.0 mmHg LVOT Vmax:         97.50 cm/s LVOT Vmean:        65.000 cm/s LVOT VTI:          0.200 m LVOT/AV VTI ratio: 0.44  AORTA Ao Root diam: 4.10 cm MITRAL VALVE                TRICUSPID VALVE MV Area (PHT): 4.80 cm     TR Peak grad:   19.4 mmHg MV Decel Time: 158 msec     TR Vmax:        220.00 cm/s MV E velocity: 88.60 cm/s MV A velocity: 115.50 cm/s  SHUNTS MV E/A ratio:  0.77         Systemic VTI:  0.20 m                             Systemic Diam: 2.20 cm Rozann Lesches MD Electronically signed by Rozann Lesches MD Signature Date/Time: 05/27/2022/5:46:15 PM    Final    CT CHEST ABDOMEN PELVIS WO CONTRAST  Result Date: 05/24/2022 CLINICAL DATA:  Abdominal pain, acute, nonlocalized. 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 C/o generalized pain, esp on his bottom. Also s/o SOB EXAM: CT CHEST, ABDOMEN AND PELVIS WITHOUT CONTRAST TECHNIQUE: Multidetector CT imaging of the chest, abdomen and pelvis was performed following the standard protocol without IV contrast. RADIATION DOSE REDUCTION: This exam was performed according to the departmental dose-optimization program which includes automated exposure control, adjustment of the mA and/or kV according to patient size and/or use of  iterative reconstruction technique. COMPARISON:  None Available. FINDINGS: CT CHEST FINDINGS Cardiovascular: Left chest wall dual lead pacemaker and defibrillator in grossly appropriate position. Normal heart size. No significant pericardial effusion. The thoracic aorta is normal in caliber. Severe atherosclerotic plaque of the thoracic aorta. Four-vessel coronary artery calcifications. Aortic valve leaflet calcification. Cardiac findings suggestive of anemia. The main pulmonary artery is enlarged in caliber measuring up to 3.7 cm. Mediastinum/Nodes: No gross hilar adenopathy, noting limited sensitivity for the detection of hilar adenopathy on this noncontrast study. No enlarged mediastinal, hilar, or axillary lymph nodes. Thyroid gland, trachea, and esophagus demonstrate no significant findings. Lungs/Pleura: Expiratory phase of respiration. Diffuse mild bronchial wall thickening. Bilateral lower lobe linear atelectasis versus scarring. No focal consolidation. No pulmonary nodule. No pulmonary mass. No pleural effusion. No pneumothorax. Musculoskeletal: Asymmetric right greater than left gynecomastia. No suspicious lytic or blastic osseous lesions. No acute displaced fracture. Multilevel degenerative changes of the spine. CT ABDOMEN PELVIS FINDINGS Hepatobiliary: No focal liver abnormality. Calcified 3.5 cm gallstone noted within the gallbladder lumen. No gallbladder wall thickening or pericholecystic fluid. No biliary dilatation. Pancreas: No focal lesion. Normal pancreatic contour. No surrounding inflammatory changes. No main pancreatic ductal dilatation. Spleen: Normal in size without focal abnormality. Adrenals/Urinary Tract: No adrenal nodule bilaterally. No nephrolithiasis and no hydronephrosis. Poorly visualized 2.2 cm left renal lesion with a density of 37 Hounsfield units (5:86, 2:65). Fluid density lesions within bilateral kidneys likely represent simple renal cysts. Simple renal cysts, in the absence of  clinically indicated signs/symptoms, require no independent follow-up. Subcentimeter hypodensities are too small to characterize-no further follow-up indicated. No ureterolithiasis or hydroureter. The urinary bladder is unremarkable. Stomach/Bowel: Stomach is within normal limits. No evidence of bowel wall thickening or dilatation. Colonic diverticulosis. Appendix appears normal. Vascular/Lymphatic: No abdominal aorta or iliac aneurysm. Severe atherosclerotic plaque of the aorta and its branches. No abdominal, pelvic, or inguinal lymphadenopathy. Reproductive: Prostate is unremarkable. Other: No intraperitoneal free fluid. No intraperitoneal free gas. No organized fluid collection. Musculoskeletal: Small fat containing umbilical hernia. Mild dermal thickening and subcutaneus soft tissue edema along the anterior abdominal wall (2:111). Mild diffuse subcutaneus soft tissue edema. No suspicious lytic or blastic osseous lesions. No acute displaced fracture. Multilevel degenerative changes of the spine. IMPRESSION: 1. Limited evaluation on this noncontrast study. 2. Mild dermal thickening and subcutaneus soft tissue edema along the anterior abdominal. No subcutaneus soft tissue emphysema. No organized fluid collection. Finding could represent cellulitis. Correlate with physical exam. 3. Enlarged main pulmonary artery suggestive of pulmonary hypertension. 4. Cholelithiasis with no CT finding of acute cholecystitis. 5. Colonic diverticulosis with no acute diverticulitis. 6. Indeterminate 2.2 cm left renal lesion. When the patient is clinically stable and able to follow directions and hold their breath (preferably as an outpatient) further evaluation with dedicated outpatient MRI renal protocol should be considered. 7. Aortic Atherosclerosis (ICD10-I70.0) including coronary artery and aortic valve leaflet calcifications-correlate for aortic stenosis. 8. Asymmetric right greater than left gynecomastia. Correlate with  physical exam. Consider mammographic appointment for further evaluation as underlying  malignancy cannot be excluded. Electronically Signed   By: Iven Finn M.D.   On: 05/24/2022 20:40   DG Chest 1 View  Result Date: 05/24/2022 CLINICAL DATA:  Shortness of breath EXAM: CHEST  1 VIEW COMPARISON:  02/08/2022 FINDINGS: Right IJ dialysis catheter tip: Cavoatrial junction. AICD noted, unchanged positioning. INSERT or chew Low lung volumes are present, causing crowding of the pulmonary vasculature. Linear bandlike opacity along the right hemidiaphragm peripherally. Indistinct density along the left peripheral hemidiaphragm could be from pericardial adipose tissues or localized airspace opacity along the lingula, and is less striking than on the 02/08/2022 exam. IMPRESSION: 1. Indistinct density along the left peripheral hemidiaphragm could be from pericardial adipose tissues or localized airspace opacity along the lingula, and is less striking than on the 02/08/2022 exam. 2. Linear bandlike opacity along the right hemidiaphragm peripherally favors atelectasis. Electronically Signed   By: Van Clines M.D.   On: 05/24/2022 17:39   CT Cervical Spine Wo Contrast  Result Date: 05/16/2022 CLINICAL DATA:  Neck pain since morning after sliding out his wheelchair EXAM: CT CERVICAL SPINE WITHOUT CONTRAST TECHNIQUE: Multidetector CT imaging of the cervical spine was performed without intravenous contrast. Multiplanar CT image reconstructions were also generated. RADIATION DOSE REDUCTION: This exam was performed according to the departmental dose-optimization program which includes automated exposure control, adjustment of the mA and/or kV according to patient size and/or use of iterative reconstruction technique. COMPARISON:  None Available. FINDINGS: Alignment: Straightening of the cervical spine Skull base and vertebrae: No acute fracture. No primary bone lesion or focal pathologic process. Soft tissues and  spinal canal: No prevertebral fluid or swelling. No visible canal hematoma. Disc levels: C2-C3:  No significant findings C3-C4: Uncovertebral joint arthropathy without significant neural foraminal stenosis C4-C5: Disc height loss and moderate left facet joint arthropathy. Mild left neural foraminal stenosis. C5-C6:  No significant finding C6-C7: Disc height loss and uncovertebral joint arthropathy without significant spinal canal or neural foraminal stenosis C7-T1:  No significant finding Upper chest: Negative. Other: Prominent vertebral artery atherosclerotic calcifications. IMPRESSION: 1. No acute fracture or traumatic subluxation. 2. Mild multilevel degenerative disc disease and uncovertebral joint arthropathy. 3. Prominent vertebral artery atherosclerotic calcifications. Electronically Signed   By: Keane Police D.O.   On: 05/16/2022 19:14    Micro Results    No results found for this or any previous visit (from the past 240 hour(s)).  Today   Subjective    Shray Bonello today has no headache,no chest abdominal pain,no new weakness tingling or numbness, feels much better   Objective   Blood pressure 136/65, pulse 88, temperature 98 F (36.7 C), temperature source Oral, resp. rate 17, height 6' (1.829 m), weight 99.9 kg, SpO2 100 %.   Intake/Output Summary (Last 24 hours) at 06/07/2022 0829 Last data filed at 06/07/2022 0600 Gross per 24 hour  Intake 1020 ml  Output 1200 ml  Net -180 ml    Exam  Awake Alert, No new F.N deficits, R.IJ HD Cath [place].AT,PERRAL Supple Neck, No JVD,   Symmetrical Chest wall movement, Good air movement bilaterally, CTAB RRR,No Gallops,Rubs or new Murmurs,  +ve B.Sounds, Abd Soft, No tenderness,   L BKA, R AKA   Data Review   Recent Labs  Lab 06/02/22 0349 06/03/22 0839 06/04/22 0342 06/05/22 1130 06/06/22 0654  WBC 12.1* 13.3* 12.7* 12.1* 13.4*  HGB 7.2* 7.1* 7.3* 6.8* 8.8*  HCT 22.5* 22.1* 23.5* 20.8* 26.5*  PLT 141* 176 201 188 228  MCV 94.1  94.8  96.3 94.1 91.4  MCH 30.1 30.5 29.9 30.8 30.3  MCHC 32.0 32.1 31.1 32.7 33.2  RDW 16.1* 16.0* 15.9* 15.9* 17.0*  LYMPHSABS 1.1 1.3 0.8 1.2 1.1  MONOABS 0.9 0.9 0.9 1.0 1.0  EOSABS 0.2 0.2 0.2 0.3 0.3  BASOSABS 0.0 0.0 0.0 0.1 0.0    Recent Labs  Lab 06/02/22 0349 06/03/22 0839 06/04/22 0342 06/05/22 1130 06/06/22 0654  NA 132* 130* 132* 132* 129*  K 3.9 4.2 4.0 4.3 4.6  CL 94* 93* 92* 92* 91*  CO2 '28 24 25 25 26  '$ ANIONGAP '10 13 15 15 12  '$ GLUCOSE 185* 154* 193* 158* 141*  BUN 28* 41* 23 38* 47*  CREATININE 5.06* 6.84* 4.45* 6.28* 7.46*  ALBUMIN 2.0* 2.1* 2.4* 2.2* 2.4*  CALCIUM 9.1 9.3 9.2 9.6 9.7     Total Time in preparing paper work, data evaluation and todays exam - 35 minutes  Signature  -    Lala Lund M.D on 06/07/2022 at 8:29 AM   -  To page go to [email or web address]

## 2022-06-07 NOTE — Discharge Instructions (Signed)
Follow with Primary MD [name], MD in 7 days   Get CBC, CMP, 2 view Chest X ray -  checked by SNF MD  in 1 week  Activity: As tolerated with Full fall precautions use walker/cane & assistance as needed  Disposition SNF  Diet: Renal, low carbohydrate diet.  1.2 L fluid restriction per day.  Check CBGs q. [place].  Special Instructions: If you have smoked or chewed Tobacco  in the last 2 yrs please stop smoking, stop any regular Alcohol  and or any Recreational drug use.  On your next visit with your primary care physician please Get Medicines reviewed and adjusted.  Please request your Prim.MD to go over all Hospital Tests and Procedure/Radiological results at the follow up, please get all Hospital records sent to your Prim MD by signing hospital release before you go home.  If you experience worsening of your admission symptoms, develop shortness of breath, life threatening emergency, suicidal or homicidal thoughts you must seek medical attention immediately by calling 911 or calling your MD immediately  if symptoms less severe.  You Must read complete instructions/literature along with all the possible adverse reactions/side effects for all the Medicines you take and that have been prescribed to you. Take any new Medicines after you have completely understood and accpet all the possible adverse reactions/side effects.

## 2022-06-07 NOTE — Progress Notes (Signed)
OT Cancellation Note  Patient Details Name: [name]. MRN: [id number] DOB: 1955-12-27   Cancelled Treatment:    Reason Eval/Treat Not Completed: Other (comment) (Pt with d/c orders in chart. D/c'ing to SNF Rehab)  Almyra Deforest, OTR/L 06/07/2022, 9:08 AM

## 2022-06-07 NOTE — Plan of Care (Signed)
  Problem: Education: Goal: Ability to describe self-care measures that may prevent or decrease complications (Diabetes Survival Skills Education) will improve Outcome: Adequate for Discharge Goal: Individualized Educational Video(s) Outcome: Adequate for Discharge   Problem: Coping: Goal: Ability to adjust to condition or change in health will improve Outcome: Adequate for Discharge   Problem: Fluid Volume: Goal: Ability to maintain a balanced intake and output will improve Outcome: Adequate for Discharge   Problem: Health Behavior/Discharge Planning: Goal: Ability to identify and utilize available resources and services will improve Outcome: Adequate for Discharge Goal: Ability to manage health-related needs will improve Outcome: Adequate for Discharge   Problem: Metabolic: Goal: Ability to maintain appropriate glucose levels will improve Outcome: Adequate for Discharge   Problem: Nutritional: Goal: Maintenance of adequate nutrition will improve Outcome: Adequate for Discharge Goal: Progress toward achieving an optimal weight will improve Outcome: Adequate for Discharge   Problem: Skin Integrity: Goal: Risk for impaired skin integrity will decrease Outcome: Adequate for Discharge   Problem: Tissue Perfusion: Goal: Adequacy of tissue perfusion will improve Outcome: Adequate for Discharge   Problem: Fluid Volume: Goal: Hemodynamic stability will improve Outcome: Adequate for Discharge   Problem: Clinical Measurements: Goal: Diagnostic test results will improve Outcome: Adequate for Discharge Goal: Signs and symptoms of infection will decrease Outcome: Adequate for Discharge   Problem: Respiratory: Goal: Ability to maintain adequate ventilation will improve Outcome: Adequate for Discharge   Problem: Education: Goal: Knowledge of General Education information will improve Description: Including pain rating scale, medication(s)/side effects and non-pharmacologic  comfort measures Outcome: Adequate for Discharge   Problem: Health Behavior/Discharge Planning: Goal: Ability to manage health-related needs will improve Outcome: Adequate for Discharge   Problem: Clinical Measurements: Goal: Ability to maintain clinical measurements within normal limits will improve Outcome: Adequate for Discharge Goal: Will remain free from infection Outcome: Adequate for Discharge Goal: Diagnostic test results will improve Outcome: Adequate for Discharge Goal: Respiratory complications will improve Outcome: Adequate for Discharge Goal: Cardiovascular complication will be avoided Outcome: Adequate for Discharge   Problem: Activity: Goal: Risk for activity intolerance will decrease Outcome: Adequate for Discharge   Problem: Nutrition: Goal: Adequate nutrition will be maintained Outcome: Adequate for Discharge   Problem: Coping: Goal: Level of anxiety will decrease Outcome: Adequate for Discharge   Problem: Elimination: Goal: Will not experience complications related to bowel motility Outcome: Adequate for Discharge Goal: Will not experience complications related to urinary retention Outcome: Adequate for Discharge   Problem: Pain Managment: Goal: General experience of comfort will improve Outcome: Adequate for Discharge   Problem: Safety: Goal: Ability to remain free from injury will improve Outcome: Adequate for Discharge   Problem: Skin Integrity: Goal: Risk for impaired skin integrity will decrease Outcome: Adequate for Discharge   

## 2022-06-09 LAB — LAB REPORT - SCANNED
A1c: 5.8
EGFR: 16

## 2022-06-20 ENCOUNTER — Inpatient Hospital Stay: Payer: Medicare Other | Admitting: Internal Medicine

## 2022-06-21 ENCOUNTER — Encounter: Payer: Self-pay | Admitting: Internal Medicine

## 2022-06-21 ENCOUNTER — Other Ambulatory Visit: Payer: Self-pay

## 2022-06-21 ENCOUNTER — Ambulatory Visit (INDEPENDENT_AMBULATORY_CARE_PROVIDER_SITE_OTHER): Payer: Medicare Other | Admitting: Internal Medicine

## 2022-06-21 VITALS — BP 147/78 | HR 91 | Temp 97.8°F

## 2022-06-21 DIAGNOSIS — I38 Endocarditis, valve unspecified: Secondary | ICD-10-CM

## 2022-06-21 NOTE — Progress Notes (Signed)
Patient Active Problem List   Diagnosis Date Noted   Class 1 obesity due to excess calories with body mass index (BMI) of 33.0 to 33.9 in adult 05/28/2022   Bacteremia 05/24/2022   Kidney lesion, native, left 05/24/2022   Gynecomastia 0000000   Complication associated with dialysis catheter 05/25/2021   COVID-19 virus infection 05/25/2021   ESRD (end stage renal disease) on dialysis ([place]) 02/18/2021   Anemia 09/15/2016   Abscess 09/09/2016   Abscess of buttock, right 09/08/2016   S/P bilateral BKA (below knee amputation) ([place]) 09/08/2016   Anemia due to chronic kidney disease 09/08/2016   Chronic combined systolic (congestive) and diastolic (congestive) heart failure ([place]) 06/20/2016   Acute hypoxemic respiratory failure ([place]) 06/19/2016   Volume overload 06/19/2016   AKI (acute kidney injury) ([place]) 06/19/2016   DM (diabetes mellitus), type 2 with peripheral vascular complications ([place]) 0000000   SOB (shortness of breath)    ACHILLES BURSITIS OR TENDINITIS 09/14/2009   PLANTAR FACIITIS 09/14/2009   TIA 09/07/2009   Peripheral vascular disease ([place]) 01/07/2009   Hyperlipidemia 08/27/2008   Essential hypertension 08/27/2008    Patient's Medications  New Prescriptions   No medications on file  Previous Medications   ASPIRIN 81 MG TABLET    Take 81 mg by mouth at bedtime.   BRIMONIDINE (ALPHAGAN) 0.2 % OPHTHALMIC SOLUTION    Place 1 drop into the right eye 3 (three) times daily.   CALCIUM ACETATE (PHOSLO) 667 MG CAPSULE    Take 667-1,334 mg by mouth See admin instructions. Take 1,334 mg by mouth three times a day with meals and 667 mg with each snack   CARVEDILOL (COREG) 3.125 MG TABLET    Take 1 tablet (3.125 mg total) by mouth 2 (two) times daily with a meal.   CEFAZOLIN (ANCEF) IVPB    Inject 2 g into the vein every Monday, Wednesday, and Friday with hemodialysis. 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   CLOPIDOGREL (PLAVIX) 75 MG TABLET    Take 75 mg by mouth at bedtime.   CYANOCOBALAMIN 1000 MCG TABLET    Take 1 tablet (1,000 mcg total) by mouth daily.   DOCUSATE SODIUM (COLACE) 100 MG CAPSULE    Take 2 capsules (200 mg total) by mouth 2 (two) times daily.   FERROUS SULFATE 325 (65 FE) MG TABLET    Take 1 tablet (325 mg total) by mouth 2 (two) times daily with a meal.   FLUTICASONE (FLONASE) 50 MCG/ACT NASAL SPRAY    Place 1 spray into both nostrils at bedtime as needed for allergies or rhinitis.   FOLIC ACID (FOLVITE) 1 MG TABLET    Take 1 mg by mouth at bedtime.   GABAPENTIN (NEURONTIN) 300 MG CAPSULE    TAKE 1 CAPSULE(300 MG) BY MOUTH DAILY   HYDROCODONE-ACETAMINOPHEN (NORCO/VICODIN) 5-325 MG TABLET    Take 1 tablet by mouth every 6 (six) hours as needed for moderate pain. Take one tab po q 4 hrs prn pain   ICOSAPENT ETHYL (VASCEPA) 1 G CAPSULE    Take  2 g by mouth 2 (two) times daily.   INSULIN ASPART (NOVOLOG) 100 UNIT/ML FLEXPEN    Before each meal 3 times a day, 140-199 - 2 units, 200-250 - 4 units, 251-299 - 6 units,  300-349 - 8 units,  350 or above 10 units.   INSULIN GLARGINE-YFGN (SEMGLEE) 100 UNIT/ML INJECTION    Inject 0.1 mLs (10 Units total) into the skin at bedtime.   METHOCARBAMOL (ROBAXIN) 500 MG TABLET    Take 1 tablet (500 mg total) by mouth 3 (three) times daily.   PANTOPRAZOLE (PROTONIX) 40 MG TABLET    Take 1 tablet (40 mg total) by mouth daily.   RIFAMPIN (RIFADIN) 300 MG CAPSULE    Take 1 capsule (300 mg total) by mouth 2 (two) times daily.   ROSUVASTATIN (CRESTOR) 20 MG TABLET    Take 20 mg by mouth at bedtime.    TRIAMCINOLONE CREAM (KENALOG) 0.1 %    Apply 1 Application topically 2 (two) times daily.  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  Blood cultures at OSH grew staph lugdunensis, 2/20 blood cultures no growth.  He was transferred [facility] to [facility] and underwent a TEE which showed thickening of aortic root concerning for aortic root abscess, perforation left coronary cusp aortic valve leaflet, lrge vegetation measuring 1.5cm x1.5cm on  both ICD lead and septal leaflet, 1.3cm x 0.4cm right ICD lead vegetation. CTS consulted and noted pt did not want surgical intervention, recommend lead removal. Noted he would be high risk of surgery. EP noted pt not a candidate for lead extraction , recommended palliate consult.  Seen by ID plan was 6 weeks of cefazolin from [place] removal on 05/27/2022 EOT 07/08/2022.  We added rifampin 300 mg p.o. twice daily for 6 weeks there is no plan for lead removal of to avoid any biofilm formation. Today 06/21/2022 presents for hospital follow-up.  We do not have abx  labs. Tolerating abx.  Review of Systems: Review of Systems  All other systems reviewed and are negative.   Past Medical History:  Diagnosis Date   AICD (automatic cardioverter/defibrillator) present    Anemia    Arthritis    Cerebrovascular disease    CHF (congestive heart failure) (HCC)    Chronic kidney disease    ESRD, MWF HD   COVID 2022   moderate   Diabetes mellitus    type II   History of TIAs    Hyperlipidemia    Hypertension    Nonischemic cardiomyopathy (HCC)    PVD (peripheral vascular disease) (HCC)    s/p B BKA   Shortness of breath dyspnea    with exertion    Skin  disease    Rare   Stroke ([place])    "light stroke"   Tobacco abuse     Social History   Tobacco Use   Smoking status: Former    Packs/day: 1.00    Years: 20.00    Additional pack years: 0.00    Total pack years: 20.00    Types: Cigarettes    Quit date: 03/12/1998    Years since quitting: 24.2    Passive exposure: Never   Smokeless tobacco: Never  Vaping Use  Vaping Use: Never used  Substance Use Topics   Alcohol use: Not Currently    Comment: occasional   Drug use: No    Family History  Problem Relation Age of Onset   Diabetes Father    Stroke Father    Diabetes Brother    Diabetes Brother    Diabetes Brother    Diabetes Brother    Heart failure Mother    Diabetes Mother    Diabetes Other    Coronary artery disease Other     Allergies  Allergen Reactions   Contrast Media [Iodinated Contrast Media] Hives and Other (See Comments)    Happened "in the 14's"   Other Other (See Comments)    "Transfer Dye" = "Makes me tired"   Acetazolamide Anxiety and Other (See Comments)    Jittery, odd feeling (hyper feeling)    Health Maintenance  Topic Date Due   Medicare Annual Wellness (AWV)  Never done   COVID-19 Vaccine (1) Never done   OPHTHALMOLOGY EXAM  Never done   DTaP/Tdap/Td (1 - Tdap) Never done   COLONOSCOPY (Pts 45-52yrs Insurance coverage will need to be confirmed)  Never done   Zoster Vaccines- Shingrix (1 of 2) Never done   Pneumonia Vaccine 53+ Years old (2 of 2 - PCV) 06/20/2017   INFLUENZA VACCINE  Never done   HEMOGLOBIN A1C  11/23/2022   Hepatitis C Screening  Completed   HPV VACCINES  Aged Out    Objective:  There were no vitals filed for this visit. There is no height or weight on file to calculate BMI.  Physical Exam Constitutional:      General: He is not in acute distress.    Appearance: He is normal weight. He is not toxic-appearing.  HENT:     Head: Normocephalic and atraumatic.     Right Ear: External ear normal.     Left Ear:  External ear normal.     Nose: No congestion or rhinorrhea.     Mouth/Throat:     Mouth: Mucous membranes are moist.     Pharynx: Oropharynx is clear.  Eyes:     Extraocular Movements: Extraocular movements intact.     Conjunctiva/sclera: Conjunctivae normal.     Pupils: Pupils are equal, round, and reactive to light.  Cardiovascular:     Rate and Rhythm: Normal rate and regular rhythm.     Heart sounds: No murmur heard.    No friction rub. No gallop.  Pulmonary:     Effort: Pulmonary effort is normal.     Breath sounds: Normal breath sounds.  Abdominal:     General: Abdomen is flat. Bowel sounds are normal.     Palpations: Abdomen is soft.  Musculoskeletal:        General: No swelling. Normal range of motion.     Cervical back: Normal range of motion and neck supple.  Skin:    General: Skin is warm and dry.  Neurological:     General: No focal deficit present.     Mental Status: He is oriented to person, place, and time.  Psychiatric:        Mood and Affect: Mood normal.     Lab Results Lab Results  Component Value Date   WBC 13.4 (H) 06/06/2022   HGB 8.8 (L) 06/06/2022   HCT 26.5 (L) 06/06/2022   MCV 91.4 06/06/2022   PLT 228 06/06/2022    Lab Results  Component Value Date   CREATININE 7.46 (H) 06/06/2022  BUN 47 (H) 06/06/2022   NA 129 (L) 06/06/2022   K 4.6 06/06/2022   CL 91 (L) 06/06/2022   CO2 26 06/06/2022    Lab Results  Component Value Date   ALT 24 05/25/2022   AST 33 05/25/2022   ALKPHOS 66 05/25/2022   BILITOT 1.3 (H) 05/25/2022    Lab Results  Component Value Date   CHOL 99 10/21/2016   HDL 34 (L) 10/21/2016   LDLCALC 38 10/21/2016   TRIG 135 10/21/2016   CHOLHDL 2.9 10/21/2016   No results found for: "LABRPR", "RPRTITER" No results found for: "HIV1RNAQUANT", "HIV1RNAVL", "CD4TABS"   Problem List Items Addressed This Visit   None  Assessment/Plan #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 - CTS and EP were consulted during admission and no plans for intervention.  We added rifampin 300 mg p.o. twice daily x 6 weeks as lead is going to stay in place. Plan: - Continue cefazolin and  rifampin EOT 07/08/2022 - Will contact dialysis center for labs weekly(cbc, cmp, esr and crp_ - Follow-up at end of therapy   #Medication monitoring -[facility] 06/09/22 labs: wbc 11.2k, LFT nl  Laurice Record, [place] [facility] 06/21/2022, 8:44 AM   I have personally spent 41 minutes involved in face-to-face and non-face-to-face activities for this patient on the day of the visit. Professional time spent includes the following activities: Preparing to see the patient (review of tests), Obtaining and/or reviewing separately obtained history (admission/discharge record), Performing a medically appropriate examination and/or evaluation , Ordering medications/tests/procedures, referring and communicating with other health care professionals, Documenting clinical information in the EMR, Independently interpreting results (not separately reported), Communicating results to the patient/family/caregiver, Counseling and educating the patient/family/caregiver and Care coordination (not separately reported).

## 2022-06-24 ENCOUNTER — Emergency Department (HOSPITAL_COMMUNITY): Payer: Medicare Other

## 2022-06-24 ENCOUNTER — Other Ambulatory Visit: Payer: Self-pay

## 2022-06-24 ENCOUNTER — Inpatient Hospital Stay (HOSPITAL_COMMUNITY)
Admission: EM | Admit: 2022-06-24 | Discharge: 2022-07-11 | DRG: 291 | Disposition: A | Discharge status: Hospice | Payer: Medicare Other | Source: Ambulatory Visit | Attending: Family Medicine | Admitting: Family Medicine

## 2022-06-24 DIAGNOSIS — L409 Psoriasis, unspecified: Secondary | ICD-10-CM | POA: Diagnosis present

## 2022-06-24 DIAGNOSIS — Z89611 Acquired absence of right leg above knee: Secondary | ICD-10-CM

## 2022-06-24 DIAGNOSIS — G9341 Metabolic encephalopathy: Secondary | ICD-10-CM | POA: Diagnosis not present

## 2022-06-24 DIAGNOSIS — J189 Pneumonia, unspecified organism: Secondary | ICD-10-CM

## 2022-06-24 DIAGNOSIS — J90 Pleural effusion, not elsewhere classified: Secondary | ICD-10-CM

## 2022-06-24 DIAGNOSIS — Z888 Allergy status to other drugs, medicaments and biological substances status: Secondary | ICD-10-CM

## 2022-06-24 DIAGNOSIS — Z6841 Body Mass Index (BMI) 40.0 and over, adult: Secondary | ICD-10-CM

## 2022-06-24 DIAGNOSIS — E1151 Type 2 diabetes mellitus with diabetic peripheral angiopathy without gangrene: Secondary | ICD-10-CM | POA: Diagnosis present

## 2022-06-24 DIAGNOSIS — I5020 Unspecified systolic (congestive) heart failure: Secondary | ICD-10-CM

## 2022-06-24 DIAGNOSIS — E1122 Type 2 diabetes mellitus with diabetic chronic kidney disease: Secondary | ICD-10-CM | POA: Diagnosis present

## 2022-06-24 DIAGNOSIS — I5023 Acute on chronic systolic (congestive) heart failure: Secondary | ICD-10-CM | POA: Insufficient documentation

## 2022-06-24 DIAGNOSIS — E8779 Other fluid overload: Secondary | ICD-10-CM | POA: Diagnosis not present

## 2022-06-24 DIAGNOSIS — E1165 Type 2 diabetes mellitus with hyperglycemia: Secondary | ICD-10-CM | POA: Diagnosis present

## 2022-06-24 DIAGNOSIS — Z79899 Other long term (current) drug therapy: Secondary | ICD-10-CM

## 2022-06-24 DIAGNOSIS — I9589 Other hypotension: Secondary | ICD-10-CM | POA: Diagnosis present

## 2022-06-24 DIAGNOSIS — M898X9 Other specified disorders of bone, unspecified site: Secondary | ICD-10-CM | POA: Diagnosis present

## 2022-06-24 DIAGNOSIS — Z992 Dependence on renal dialysis: Secondary | ICD-10-CM

## 2022-06-24 DIAGNOSIS — R7881 Bacteremia: Secondary | ICD-10-CM | POA: Diagnosis present

## 2022-06-24 DIAGNOSIS — B957 Other staphylococcus as the cause of diseases classified elsewhere: Secondary | ICD-10-CM | POA: Diagnosis present

## 2022-06-24 DIAGNOSIS — E871 Hypo-osmolality and hyponatremia: Secondary | ICD-10-CM | POA: Insufficient documentation

## 2022-06-24 DIAGNOSIS — I739 Peripheral vascular disease, unspecified: Secondary | ICD-10-CM | POA: Diagnosis present

## 2022-06-24 DIAGNOSIS — Z823 Family history of stroke: Secondary | ICD-10-CM

## 2022-06-24 DIAGNOSIS — J9622 Acute and chronic respiratory failure with hypercapnia: Secondary | ICD-10-CM | POA: Diagnosis not present

## 2022-06-24 DIAGNOSIS — Z833 Family history of diabetes mellitus: Secondary | ICD-10-CM

## 2022-06-24 DIAGNOSIS — Z7902 Long term (current) use of antithrombotics/antiplatelets: Secondary | ICD-10-CM

## 2022-06-24 DIAGNOSIS — Z91012 Allergy to eggs: Secondary | ICD-10-CM

## 2022-06-24 DIAGNOSIS — T827XXA Infection and inflammatory reaction due to other cardiac and vascular devices, implants and grafts, initial encounter: Secondary | ICD-10-CM | POA: Diagnosis present

## 2022-06-24 DIAGNOSIS — E662 Morbid (severe) obesity with alveolar hypoventilation: Secondary | ICD-10-CM | POA: Insufficient documentation

## 2022-06-24 DIAGNOSIS — Z87891 Personal history of nicotine dependence: Secondary | ICD-10-CM

## 2022-06-24 DIAGNOSIS — M533 Sacrococcygeal disorders, not elsewhere classified: Secondary | ICD-10-CM | POA: Diagnosis not present

## 2022-06-24 DIAGNOSIS — N289 Disorder of kidney and ureter, unspecified: Secondary | ICD-10-CM | POA: Diagnosis present

## 2022-06-24 DIAGNOSIS — Z89612 Acquired absence of left leg above knee: Secondary | ICD-10-CM

## 2022-06-24 DIAGNOSIS — E8729 Other acidosis: Secondary | ICD-10-CM | POA: Diagnosis not present

## 2022-06-24 DIAGNOSIS — R0602 Shortness of breath: Secondary | ICD-10-CM | POA: Diagnosis not present

## 2022-06-24 DIAGNOSIS — R946 Abnormal results of thyroid function studies: Secondary | ICD-10-CM | POA: Diagnosis not present

## 2022-06-24 DIAGNOSIS — L299 Pruritus, unspecified: Secondary | ICD-10-CM | POA: Diagnosis not present

## 2022-06-24 DIAGNOSIS — Z95828 Presence of other vascular implants and grafts: Secondary | ICD-10-CM

## 2022-06-24 DIAGNOSIS — F419 Anxiety disorder, unspecified: Secondary | ICD-10-CM | POA: Diagnosis not present

## 2022-06-24 DIAGNOSIS — K5900 Constipation, unspecified: Secondary | ICD-10-CM | POA: Diagnosis not present

## 2022-06-24 DIAGNOSIS — Z515 Encounter for palliative care: Secondary | ICD-10-CM

## 2022-06-24 DIAGNOSIS — Z91199 Patient's noncompliance with other medical treatment and regimen due to unspecified reason: Secondary | ICD-10-CM

## 2022-06-24 DIAGNOSIS — Z95810 Presence of automatic (implantable) cardiac defibrillator: Secondary | ICD-10-CM

## 2022-06-24 DIAGNOSIS — R680 Hypothermia, not associated with low environmental temperature: Secondary | ICD-10-CM | POA: Diagnosis not present

## 2022-06-24 DIAGNOSIS — I132 Hypertensive heart and chronic kidney disease with heart failure and with stage 5 chronic kidney disease, or end stage renal disease: Principal | ICD-10-CM | POA: Diagnosis present

## 2022-06-24 DIAGNOSIS — Z8673 Personal history of transient ischemic attack (TIA), and cerebral infarction without residual deficits: Secondary | ICD-10-CM

## 2022-06-24 DIAGNOSIS — Z7189 Other specified counseling: Secondary | ICD-10-CM

## 2022-06-24 DIAGNOSIS — J810 Acute pulmonary edema: Secondary | ICD-10-CM

## 2022-06-24 DIAGNOSIS — Z1152 Encounter for screening for COVID-19: Secondary | ICD-10-CM

## 2022-06-24 DIAGNOSIS — R34 Anuria and oliguria: Secondary | ICD-10-CM | POA: Diagnosis not present

## 2022-06-24 DIAGNOSIS — I38 Endocarditis, valve unspecified: Secondary | ICD-10-CM

## 2022-06-24 DIAGNOSIS — N2581 Secondary hyperparathyroidism of renal origin: Secondary | ICD-10-CM | POA: Diagnosis present

## 2022-06-24 DIAGNOSIS — Z8249 Family history of ischemic heart disease and other diseases of the circulatory system: Secondary | ICD-10-CM

## 2022-06-24 DIAGNOSIS — Y831 Surgical operation with implant of artificial internal device as the cause of abnormal reaction of the patient, or of later complication, without mention of misadventure at the time of the procedure: Secondary | ICD-10-CM | POA: Diagnosis present

## 2022-06-24 DIAGNOSIS — Z8616 Personal history of COVID-19: Secondary | ICD-10-CM

## 2022-06-24 DIAGNOSIS — D631 Anemia in chronic kidney disease: Secondary | ICD-10-CM | POA: Diagnosis present

## 2022-06-24 DIAGNOSIS — J9602 Acute respiratory failure with hypercapnia: Secondary | ICD-10-CM

## 2022-06-24 DIAGNOSIS — Z5320 Procedure and treatment not carried out because of patient's decision for unspecified reasons: Secondary | ICD-10-CM | POA: Diagnosis not present

## 2022-06-24 DIAGNOSIS — E876 Hypokalemia: Secondary | ICD-10-CM | POA: Diagnosis present

## 2022-06-24 DIAGNOSIS — E440 Moderate protein-calorie malnutrition: Secondary | ICD-10-CM | POA: Diagnosis present

## 2022-06-24 DIAGNOSIS — I428 Other cardiomyopathies: Secondary | ICD-10-CM | POA: Diagnosis present

## 2022-06-24 DIAGNOSIS — E785 Hyperlipidemia, unspecified: Secondary | ICD-10-CM | POA: Diagnosis present

## 2022-06-24 DIAGNOSIS — Z794 Long term (current) use of insulin: Secondary | ICD-10-CM

## 2022-06-24 DIAGNOSIS — I255 Ischemic cardiomyopathy: Secondary | ICD-10-CM | POA: Diagnosis present

## 2022-06-24 DIAGNOSIS — J440 Chronic obstructive pulmonary disease with acute lower respiratory infection: Secondary | ICD-10-CM | POA: Diagnosis present

## 2022-06-24 DIAGNOSIS — I330 Acute and subacute infective endocarditis: Secondary | ICD-10-CM

## 2022-06-24 DIAGNOSIS — E8770 Fluid overload, unspecified: Secondary | ICD-10-CM | POA: Diagnosis present

## 2022-06-24 DIAGNOSIS — N186 End stage renal disease: Secondary | ICD-10-CM | POA: Diagnosis present

## 2022-06-24 DIAGNOSIS — I351 Nonrheumatic aortic (valve) insufficiency: Secondary | ICD-10-CM | POA: Diagnosis present

## 2022-06-24 DIAGNOSIS — J9601 Acute respiratory failure with hypoxia: Secondary | ICD-10-CM

## 2022-06-24 DIAGNOSIS — R627 Adult failure to thrive: Secondary | ICD-10-CM | POA: Diagnosis present

## 2022-06-24 DIAGNOSIS — D696 Thrombocytopenia, unspecified: Secondary | ICD-10-CM | POA: Diagnosis not present

## 2022-06-24 DIAGNOSIS — R230 Cyanosis: Secondary | ICD-10-CM | POA: Diagnosis not present

## 2022-06-24 DIAGNOSIS — I5189 Other ill-defined heart diseases: Secondary | ICD-10-CM | POA: Diagnosis present

## 2022-06-24 DIAGNOSIS — Z66 Do not resuscitate: Secondary | ICD-10-CM | POA: Diagnosis not present

## 2022-06-24 DIAGNOSIS — M1990 Unspecified osteoarthritis, unspecified site: Secondary | ICD-10-CM | POA: Diagnosis present

## 2022-06-24 DIAGNOSIS — I953 Hypotension of hemodialysis: Secondary | ICD-10-CM | POA: Diagnosis not present

## 2022-06-24 DIAGNOSIS — Z7982 Long term (current) use of aspirin: Secondary | ICD-10-CM

## 2022-06-24 DIAGNOSIS — J9621 Acute and chronic respiratory failure with hypoxia: Secondary | ICD-10-CM | POA: Diagnosis not present

## 2022-06-24 DIAGNOSIS — R451 Restlessness and agitation: Secondary | ICD-10-CM | POA: Diagnosis not present

## 2022-06-24 LAB — CBC WITH DIFFERENTIAL/PLATELET
Abs Immature Granulocytes: 0.04 10*3/uL (ref 0.00–0.07)
Basophils Absolute: 0 10*3/uL (ref 0.0–0.1)
Basophils Relative: 0 %
Eosinophils Absolute: 0.3 10*3/uL (ref 0.0–0.5)
Eosinophils Relative: 2 %
HCT: 30.5 % — ABNORMAL LOW (ref 39.0–52.0)
Hemoglobin: 9.3 g/dL — ABNORMAL LOW (ref 13.0–17.0)
Immature Granulocytes: 0 %
Lymphocytes Relative: 7 %
Lymphs Abs: 0.8 10*3/uL (ref 0.7–4.0)
MCH: 31 pg (ref 26.0–34.0)
MCHC: 30.5 g/dL (ref 30.0–36.0)
MCV: 101.7 fL — ABNORMAL HIGH (ref 80.0–100.0)
Monocytes Absolute: 1.2 10*3/uL — ABNORMAL HIGH (ref 0.1–1.0)
Monocytes Relative: 11 %
Neutro Abs: 8.9 10*3/uL — ABNORMAL HIGH (ref 1.7–7.7)
Neutrophils Relative %: 80 %
Platelets: 168 10*3/uL (ref 150–400)
RBC: 3 MIL/uL — ABNORMAL LOW (ref 4.22–5.81)
RDW: 17.5 % — ABNORMAL HIGH (ref 11.5–15.5)
WBC: 11.3 10*3/uL — ABNORMAL HIGH (ref 4.0–10.5)
nRBC: 0 % (ref 0.0–0.2)

## 2022-06-24 LAB — GLUCOSE, CAPILLARY
Glucose-Capillary: 99 mg/dL (ref 70–99)
Glucose-Capillary: 138 mg/dL — ABNORMAL HIGH (ref 70–99)

## 2022-06-24 LAB — COMPREHENSIVE METABOLIC PANEL
ALT: <5 U/L (ref 0–44)
AST: 16 U/L (ref 15–41)
Albumin: 2.5 g/dL — ABNORMAL LOW (ref 3.5–5.0)
Alkaline Phosphatase: 87 U/L (ref 38–126)
Anion gap: 10 (ref 5–15)
BUN: 20 mg/dL (ref 8–23)
CO2: 27 mmol/L (ref 22–32)
Calcium: 9 mg/dL (ref 8.9–10.3)
Chloride: 94 mmol/L — ABNORMAL LOW (ref 98–111)
Creatinine, Ser: 4.11 mg/dL — ABNORMAL HIGH (ref 0.61–1.24)
GFR, Estimated: 15 mL/min — ABNORMAL LOW (ref >60)
Glucose, Bld: 119 mg/dL — ABNORMAL HIGH (ref 70–99)
Potassium: 3.4 mmol/L — ABNORMAL LOW (ref 3.5–5.1)
Sodium: 131 mmol/L — ABNORMAL LOW (ref 135–145)
Total Bilirubin: 0.7 mg/dL (ref 0.3–1.2)
Total Protein: 6.2 g/dL — ABNORMAL LOW (ref 6.5–8.1)

## 2022-06-24 LAB — RESP PANEL BY RT-PCR (RSV, FLU A&B, COVID)  RVPGX2
Influenza A by PCR: NEGATIVE
Influenza B by PCR: NEGATIVE
Resp Syncytial Virus by PCR: NEGATIVE
SARS Coronavirus 2 by RT PCR: NEGATIVE

## 2022-06-24 LAB — HEPATITIS B SURFACE ANTIGEN: Hepatitis B Surface Ag: NONREACTIVE

## 2022-06-24 LAB — BRAIN NATRIURETIC PEPTIDE: B Natriuretic Peptide: 2666 pg/mL — ABNORMAL HIGH (ref 0.0–100.0)

## 2022-06-24 MED ORDER — INSULIN ASPART 100 UNIT/ML IJ SOLN
0.0000 [IU] | Freq: Three times a day (TID) | INTRAMUSCULAR | Status: DC
  Administered 2022-06-25 – 2022-06-26 (×2): 1 [IU] via SUBCUTANEOUS
  Administered 2022-06-26: 2 [IU] via SUBCUTANEOUS
  Administered 2022-06-26 – 2022-06-30 (×4): 1 [IU] via SUBCUTANEOUS
  Administered 2022-06-30: 2 [IU] via SUBCUTANEOUS
  Administered 2022-07-01: 1 [IU] via SUBCUTANEOUS
  Administered 2022-07-01 – 2022-07-02 (×2): 2 [IU] via SUBCUTANEOUS
  Administered 2022-07-03: 3 [IU] via SUBCUTANEOUS
  Administered 2022-07-03: 1 [IU] via SUBCUTANEOUS
  Administered 2022-07-04: 2 [IU] via SUBCUTANEOUS
  Administered 2022-07-04 – 2022-07-05 (×3): 1 [IU] via SUBCUTANEOUS
  Administered 2022-07-05: 2 [IU] via SUBCUTANEOUS

## 2022-06-24 MED ORDER — ONDANSETRON HCL 4 MG/2ML IJ SOLN
4.0000 mg | Freq: Four times a day (QID) | INTRAMUSCULAR | Status: DC | PRN
  Administered 2022-06-26 – 2022-07-08 (×6): 4 mg via INTRAVENOUS
  Filled 2022-06-24 (×5): qty 2

## 2022-06-24 MED ORDER — CLOPIDOGREL BISULFATE 75 MG PO TABS
75.0000 mg | ORAL_TABLET | Freq: Every day | ORAL | Status: DC
  Administered 2022-06-24 – 2022-07-04 (×10): 75 mg via ORAL
  Filled 2022-06-24 (×10): qty 1

## 2022-06-24 MED ORDER — PANTOPRAZOLE SODIUM 40 MG PO TBEC
40.0000 mg | DELAYED_RELEASE_TABLET | Freq: Every day | ORAL | Status: DC
  Administered 2022-06-24 – 2022-07-05 (×9): 40 mg via ORAL
  Filled 2022-06-24 (×12): qty 1

## 2022-06-24 MED ORDER — ONDANSETRON HCL 4 MG PO TABS: 4.0000 mg | ORAL_TABLET | Freq: Four times a day (QID) | ORAL | Status: DC | PRN

## 2022-06-24 MED ORDER — CEFAZOLIN SODIUM-DEXTROSE 2-4 GM/100ML-% IV SOLN
2.0000 g | INTRAVENOUS | Status: DC
  Administered 2022-06-27: 2 g via INTRAVENOUS
  Filled 2022-06-24: qty 100

## 2022-06-24 MED ORDER — INSULIN GLARGINE-YFGN 100 UNIT/ML ~~LOC~~ SOLN
10.0000 [IU] | Freq: Every day | SUBCUTANEOUS | Status: DC
  Administered 2022-06-24 – 2022-06-27 (×3): 10 [IU] via SUBCUTANEOUS
  Filled 2022-06-24 (×4): qty 0.1

## 2022-06-24 MED ORDER — CEFAZOLIN SODIUM-DEXTROSE 2-4 GM/100ML-% IV SOLN: 2.0000 g | INTRAVENOUS | Status: DC

## 2022-06-24 MED ORDER — ALBUTEROL SULFATE HFA 108 (90 BASE) MCG/ACT IN AERS
2.0000 | INHALATION_SPRAY | RESPIRATORY_TRACT | Status: DC | PRN
  Administered 2022-06-25: 2 via RESPIRATORY_TRACT
  Filled 2022-06-24: qty 6.7

## 2022-06-24 MED ORDER — CARVEDILOL 3.125 MG PO TABS
3.1250 mg | ORAL_TABLET | Freq: Two times a day (BID) | ORAL | Status: DC
  Administered 2022-06-24 – 2022-06-27 (×6): 3.125 mg via ORAL
  Filled 2022-06-24 (×8): qty 1

## 2022-06-24 MED ORDER — CEFAZOLIN SODIUM-DEXTROSE 2-4 GM/100ML-% IV SOLN
2.0000 g | INTRAVENOUS | Status: AC
  Administered 2022-06-25: 2 g via INTRAVENOUS
  Filled 2022-06-24: qty 100

## 2022-06-24 MED ORDER — RIFAMPIN 300 MG PO CAPS
300.0000 mg | ORAL_CAPSULE | Freq: Two times a day (BID) | ORAL | Status: DC
  Administered 2022-06-24 – 2022-07-05 (×18): 300 mg via ORAL
  Filled 2022-06-24 (×29): qty 1

## 2022-06-24 MED ORDER — HYDROCODONE-ACETAMINOPHEN 5-325 MG PO TABS
1.0000 | ORAL_TABLET | Freq: Four times a day (QID) | ORAL | Status: DC | PRN
  Administered 2022-06-24 – 2022-06-27 (×6): 1 via ORAL
  Filled 2022-06-24 (×6): qty 1

## 2022-06-24 MED ORDER — HEPARIN SODIUM (PORCINE) 5000 UNIT/ML IJ SOLN
5000.0000 [IU] | Freq: Three times a day (TID) | INTRAMUSCULAR | Status: DC
  Administered 2022-06-24 – 2022-07-06 (×34): 5000 [IU] via SUBCUTANEOUS
  Filled 2022-06-24 (×33): qty 1

## 2022-06-24 MED ORDER — ACETAMINOPHEN 325 MG PO TABS
650.0000 mg | ORAL_TABLET | Freq: Four times a day (QID) | ORAL | Status: DC | PRN
  Administered 2022-06-25 – 2022-06-30 (×4): 650 mg via ORAL
  Filled 2022-06-24 (×4): qty 2

## 2022-06-24 MED ORDER — ACETAMINOPHEN 650 MG RE SUPP: 650.0000 mg | Freq: Four times a day (QID) | RECTAL | Status: DC | PRN

## 2022-06-24 MED ORDER — INSULIN ASPART 100 UNIT/ML IJ SOLN: 0.0000 [IU] | Freq: Every day | INTRAMUSCULAR | Status: DC

## 2022-06-24 NOTE — ED Provider Notes (Signed)
[facility] Provider Note   CSN: [id number] Arrival date & time: 06/24/22  1250     History  Chief Complaint  Patient presents with   [name] of [place] Jr. is a 67 y.o. male.  Patient complains of [name] of breath and weakness.  He has a history of renal failure and congestive heart failure.  Patient without dialysis and only received 1 hour of his dialysis today and they sent him over here.  The history is provided by the patient and medical records. No language interpreter was used.  [name] of Breath Severity:  Moderate Timing:  Constant Progression:  Worsening Chronicity:  Recurrent Context: activity   Relieved by:  Nothing Worsened by:  Nothing Ineffective treatments:  None tried Associated symptoms: no abdominal pain, no chest pain, no cough, no headaches and no rash        Home Medications Prior to Admission medications   Medication Sig Start Date End Date Taking? Authorizing Provider  aspirin 81 MG tablet Take 81 mg by mouth at bedtime.    [provider]  brimonidine (ALPHAGAN) 0.2 % ophthalmic solution Place 1 drop into the right eye 3 (three) times daily.    [provider]  calcium acetate (PHOSLO) 667 MG capsule Take 667-1,334 mg by mouth See admin instructions. Take 1,334 mg by mouth three times a day with meals and 667 mg with each snack 03/25/15   [provider]  carvedilol (COREG) 3.125 MG tablet Take 1 tablet (3.125 mg total) by mouth 2 (two) times daily with a meal. 06/07/22   Thurnell Lose, MD  ceFAZolin (ANCEF) IVPB Inject 2 g into the vein every Monday, Wednesday, and Friday with hemodialysis. 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 06/07/22 07/12/22  Thurnell Lose, MD  clopidogrel (PLAVIX) 75 MG tablet Take 75 mg by mouth at bedtime.    [provider]  cyanocobalamin 1000 MCG tablet Take 1 tablet (1,000 mcg total) by mouth daily. 06/07/22   Thurnell Lose, MD  docusate sodium (COLACE) 100 MG capsule Take 2 capsules (200 mg total) by mouth 2 (two) times daily. 06/07/22   Thurnell Lose, MD  ferrous sulfate 325 (65 FE) MG tablet Take 1 tablet (325 mg total) by mouth 2 (two) times daily with a meal. 06/07/22   Thurnell Lose, MD  fluticasone (FLONASE) 50 MCG/ACT nasal spray Place 1 spray into both nostrils at bedtime as needed for allergies or rhinitis.    [provider]  folic acid (FOLVITE) 1 MG tablet Take 1 mg by mouth at bedtime.    [provider]  gabapentin (NEURONTIN) 300 MG capsule TAKE 1 CAPSULE(300 MG) BY MOUTH DAILY Patient taking differently: Take 300 mg by mouth at bedtime. 01/07/22   [name], MD  HYDROcodone-acetaminophen (NORCO/VICODIN) 5-325 MG tablet Take 1 tablet by mouth every 6 (six) hours as needed for moderate pain. Take one tab po q 4 hrs prn pain 06/07/22   Thurnell Lose, MD  icosapent Ethyl (VASCEPA) 1 g  capsule Take 2 g by mouth 2 (two) times daily. 04/29/22   [provider]  insulin aspart (NOVOLOG) 100 UNIT/ML FlexPen Before each meal 3 times a day, 140-199 - 2 units, 200-250 - 4 units, 251-299 - 6 units,  300-349 - 8 units,  350 or above 10 units. 06/07/22   Thurnell Lose, MD  insulin glargine-yfgn (SEMGLEE) 100 UNIT/ML injection Inject 0.1 mLs (10 Units total) into the skin at bedtime. 06/07/22   Thurnell Lose, MD  methocarbamol (ROBAXIN) 500 MG tablet Take 1 tablet (500 mg total) by mouth 3 (three) times daily. Patient not taking: Reported on 05/24/2022  05/16/22   Kem Parkinson, PA-C  pantoprazole (PROTONIX) 40 MG tablet Take 1 tablet (40 mg total) by mouth daily. 06/07/22   Thurnell Lose, MD  rifampin (RIFADIN) 300 MG capsule Take 1 capsule (300 mg total) by mouth 2 (two) times daily. 06/07/22 07/12/22  Thurnell Lose, MD  rosuvastatin (CRESTOR) 20 MG tablet Take 20 mg by mouth at bedtime. 02/24/15   [provider]  triamcinolone cream (KENALOG) 0.1 % Apply 1 Application topically 2 (two) times daily.    [provider]      Allergies    Contrast media [iodinated contrast media], Other, and Acetazolamide    Review of Systems   Review of Systems  Constitutional:  Negative for appetite change and fatigue.  HENT:  Negative for congestion, ear discharge and sinus pressure.   Eyes:  Negative for discharge.  Respiratory:  Positive for [name] of breath. Negative for cough.   Cardiovascular:  Negative for chest pain.  Gastrointestinal:  Negative for abdominal pain and diarrhea.  Genitourinary:  Negative for frequency and hematuria.  Musculoskeletal:  Negative for back pain.  Skin:  Negative for rash.  Neurological:  Negative for seizures and headaches.  Psychiatric/Behavioral:  Negative for hallucinations.     Physical Exam Updated Vital Signs BP (!) 147/59   Pulse 90   Temp 98.7 F (37.1 C) (Oral)   Resp 16   Ht 4\' 3"  (1.295 m) Comment: bilateral amp  Wt 120.9 kg   SpO2 100%   BMI 72.06 kg/m  Physical Exam Vitals and nursing note reviewed.  Constitutional:      Appearance: He is well-developed.  HENT:     Head: Normocephalic.     Mouth/Throat:     Mouth: Mucous membranes are moist.  Eyes:     General: No scleral icterus.    Conjunctiva/sclera: Conjunctivae normal.  Neck:     Thyroid: No thyromegaly.  Cardiovascular:     Rate and Rhythm: Normal rate and regular rhythm.     Heart sounds: No murmur heard.    No friction rub. No gallop.  Pulmonary:     Breath sounds: No stridor. Wheezing present.  No rales.  Chest:     Chest wall: No tenderness.  Abdominal:     General: There is no distension.     Tenderness: There is no abdominal tenderness. There is no rebound.  Musculoskeletal:        General: Normal range of motion.     Cervical back: Neck supple.     Comments: 3+ edema in extremities  Lymphadenopathy:     Cervical: No cervical adenopathy.  Skin:    Findings: No erythema or rash.  Neurological:     Mental Status: He is oriented to person, place, and time.     Motor: No abnormal muscle tone.     Coordination: Coordination  normal.  Psychiatric:        Behavior: Behavior normal.     ED Results / Procedures / Treatments   Labs (all labs ordered are listed, but only abnormal results are displayed) Labs Reviewed  CBC WITH DIFFERENTIAL/PLATELET - Abnormal; Notable for the following components:      Result Value   WBC 11.3 (*)    RBC 3.00 (*)    Hemoglobin 9.3 (*)    HCT 30.5 (*)    MCV 101.7 (*)    RDW 17.5 (*)    Neutro Abs 8.9 (*)    Monocytes Absolute 1.2 (*)    All other components within normal limits  BRAIN NATRIURETIC PEPTIDE - Abnormal; Notable for the following components:   B Natriuretic Peptide 2,666.0 (*)    All other components within normal limits  COMPREHENSIVE METABOLIC PANEL - Abnormal; Notable for the following components:   Sodium 131 (*)    Potassium 3.4 (*)    Chloride 94 (*)    Glucose, Bld 119 (*)    Creatinine, Ser 4.11 (*)    Total Protein 6.2 (*)    Albumin 2.5 (*)    GFR, Estimated 15 (*)    All other components within normal limits  RESP PANEL BY RT-PCR (RSV, FLU A&B, COVID)  [id number]    EKG None  Radiology DG Chest Port 1 View  Result Date: 06/24/2022 CLINICAL DATA:  [id number] SOB ([name] of breath) [id number] EXAM: PORTABLE CHEST - 1 VIEW COMPARISON:  05/24/2022 FINDINGS: Cardiac silhouette is prominent. There is pulmonary interstitial prominence with vascular congestion. No focal consolidation. No pneumothorax or. There may be  small pleural effusions. Aorta is calcified. There is a left-sided pacer. Right-sided dialysis catheter tip overlies right atrium. IMPRESSION: Findings suggest CHF. Electronically Signed   By: Sammie Bench M.D.   On: 06/24/2022 13:26    Procedures Procedures    Medications Ordered in ED Medications  albuterol (VENTOLIN HFA) 108 (90 Base) MCG/ACT inhaler 2 puff (has no administration in time range)    ED Course/ Medical Decision Making/ A&P  CRITICAL CARE Performed by: Milton Ferguson Total critical care time: 45 minutes Critical care time was exclusive of separately billable procedures and treating other patients. Critical care was necessary to treat or prevent imminent or life-threatening deterioration. Critical care was time spent personally by me on the following activities: development of treatment plan with patient and/or surrogate as well as nursing, discussions with consultants, evaluation of patient's response to treatment, examination of patient, obtaining history from patient or surrogate, ordering and performing treatments and interventions, ordering and review of laboratory studies, ordering and review of radiographic studies, pulse oximetry and re-evaluation of patient's condition.                            Medical Decision Making Amount and/or Complexity of Data Reviewed Labs: ordered. Radiology: ordered.  Risk Prescription drug management. Decision regarding hospitalization.  This patient presents to the ED for concern of weakness and [name] of breath, this involves an extensive number of treatment options, and is a complaint that carries with it a high risk of complications and morbidity.  The differential diagnosis includes Monia, congestive heart failure   Co morbidities that complicate the patient evaluation  Congestive heart failure and renal failure   Additional history obtained:  Additional history obtained from patient External records from  outside source obtained and reviewed including hospital records   Lab Tests:  I Ordered, and personally interpreted labs.  The pertinent results include: BNP 2666.  Creatinine 4   Imaging Studies ordered:  I ordered imaging studies including chest x-ray I independently visualized and interpreted imaging which showed congestive heart failure I agree with the radiologist interpretation   Cardiac Monitoring: / EKG:  The patient was maintained on a cardiac monitor.  I personally viewed and interpreted the cardiac monitored which showed an underlying rhythm of: Normal sinus rhythm   Consultations Obtained:  I requested consultation with the nephrology and hospitalist,  and discussed lab and imaging findings as well as pertinent plan - they recommend: Hospitalist admit and nephrology to arrange dialysis   Problem List / ED Course / Critical interventions / Medication management  Renal failure and congestive heart failure No medicines ordered Reevaluation of the patient after these medicines showed that the patient stayed the same I have reviewed the patients home medicines and have made adjustments as needed   Social Determinants of Health:  None   Test / Admission - Considered:  None  Patient with congestive heart failure.  He will be admitted to medicine and nephrology will arrange dialysis today        Final Clinical Impression(s) / ED Diagnoses Final diagnoses:  Systolic congestive heart failure, unspecified HF chronicity ([place])    Rx / DC Orders ED Discharge Orders     None         Milton Ferguson, MD 06/24/22 1824

## 2022-06-24 NOTE — H&P (Addendum)
History and Physical    [name]. [id number] DOB: 10-03-1955 DOA: 06/24/2022  I have briefly reviewed the patient's prior medical records in [place]  PCP: [name], MD  Patient coming from: [facility] prior)  Chief Complaint: SOB  HPI: [name]. is a 67 y.o. male with medical history significant of recent hospitalization with bacteremia,  ESRD on HD, DM.  Patient was brought in from [facility] with c/o SOB.  He had over 1/2 of his HD remaining as he only completed 1 of his 4 hours.  Patient denies chest pain, denies fevers, denies chills.  Patient reports he had fallen asleep and woke up coughing and was sent to the ER.  O2 sats were 95% on RA.   No fevers, no chills  In the ER, x ray showed volume overload, elevated BNP, and low K.  ER doc spoke with renal MD who suggested admission and to finish his HD session.  Appears that will not happen until 3/23.   I called the [place] center to figure out why they sent him here and was told by the RN that the NP listened to the patient and did not hear any "air movement" and was concerned for PNA so sent him to the ER without completing his HD.   Review of Systems: unable to do full ROS  Past Medical History:  Diagnosis Date   AICD (automatic cardioverter/defibrillator) present    Anemia    Arthritis    Cerebrovascular disease    CHF (congestive heart failure) (HCC)    Chronic kidney disease    ESRD, MWF HD   COVID 2022   moderate   Diabetes mellitus    type II   History of TIAs    Hyperlipidemia    Hypertension    Nonischemic cardiomyopathy (HCC)    PVD (peripheral vascular disease) (HCC)    s/p B BKA   Shortness of breath dyspnea    with exertion    Skin disease    Rare   Stroke ([place])    "light stroke"   Tobacco abuse     Past Surgical History:  Procedure Laterality Date   A/V FISTULAGRAM Left 06/24/2016   Procedure: A/V Fistulagram;  Surgeon: Elam Dutch, MD;  Location: [place] [facility];  Service: Cardiovascular;  Laterality: Left;   A/V FISTULAGRAM N/A 01/17/2017   Procedure: A/V Fistulagram - left arm;  Surgeon: Serafina Mitchell, MD;  Location: [place] [facility];  Service: Cardiovascular;  Laterality: N/A;   AORTIC ARCH ANGIOGRAPHY N/A 10/21/2021   Procedure: AORTIC ARCH ANGIOGRAPHY;  Surgeon: Marty Heck, MD;  Location: [place] [facility];  Service: Cardiovascular;  Laterality: N/A;   AV FISTULA PLACEMENT Left 04/08/2015   Procedure: Creation of Left arm BRACHIOCEPHALIC ARTERIOVENOUS  FISTULA ;  Surgeon: Mal Misty, MD;  Location: [place];  Service: Vascular;  Laterality: Left;   AV FISTULA PLACEMENT Left 10/27/2020   Procedure: LEFT ARM ARTERIOVENOUS (AV) FISTULA CREATION;  Surgeon: Rosetta Posner, MD;  Location: [facility];  Service: Vascular;  Laterality: Left;   AV FISTULA PLACEMENT Left 06/29/2021   Procedure: INSERTION OF LEFT ARM ARTERIOVENOUS (AV) GORE-TEX GRAFT;  Surgeon: Rosetta Posner, MD;  Location: [facility];  Service: Vascular;  Laterality: Left;   [place] Left 01/12/2021   Procedure: LEFT ARM SECOND STAGE BASILIC VEIN TRANSPOSITION;  Surgeon: Rosetta Posner, MD;  Location: [facility];  Service:  Vascular;  Laterality: Left;   CARDIAC DEFIBRILLATOR PLACEMENT     CARDIAC DEFIBRILLATOR PLACEMENT     CATARACT EXTRACTION W/PHACO Right 03/17/2015   Procedure: CATARACT EXTRACTION PHACO AND INTRAOCULAR LENS PLACEMENT ([place]);  Surgeon: Rutherford Guys, MD;  Location: [facility];  Service: Ophthalmology;  Laterality: Right;  CDE: 6.59   EXCHANGE OF A DIALYSIS CATHETER Right 02/08/2022   Procedure: EXCHANGE OF A TUNNELED DIALYSIS CATHETER;  Surgeon: Rosetta Posner, MD;  Location: [place];  Service: Vascular;  Laterality: Right;   EYE SURGERY Right    Cataract   IR FLUORO GUIDE CV LINE RIGHT  05/26/2021   IR FLUORO GUIDE CV LINE RIGHT  05/30/2022   IR PTA VENOUS EXCEPT DIALYSIS CIRCUIT  05/26/2021   IR US GUIDE VASC ACCESS RIGHT  05/30/2022   LEG  AMPUTATION BELOW KNEE     bilateral   LIGATION ARTERIOVENOUS GORTEX GRAFT Left 09/14/2021   Procedure: LIGATION OF LEFT ARM ARTERIOVENOUS GORTEX GRAFT;  Surgeon: Broadus John, MD;  Location: [place];  Service: Vascular;  Laterality: Left;  PERIPHERAL NERVE BLOCK   PERIPHERAL VASCULAR INTERVENTION  01/17/2017   Procedure: PERIPHERAL VASCULAR INTERVENTION;  Surgeon: Serafina Mitchell, MD;  Location: [place] [facility];  Service: Cardiovascular;;  PTA  left arm fistula   TEE WITHOUT CARDIOVERSION N/A 05/31/2022   Procedure: TRANSESOPHAGEAL ECHOCARDIOGRAM (TEE);  Surgeon: Donato Heinz, MD;  Location: [place];  Service: Cardiovascular;  Laterality: N/A;   UPPER EXTREMITY ANGIOGRAPHY N/A 10/21/2021   Procedure: Upper Extremity Angiography;  Surgeon: Marty Heck, MD;  Location: [place] [facility];  Service: Cardiovascular;  Laterality: N/A;   WOUND DEBRIDEMENT Left 02/18/2021   Procedure: LEFT ARM DEBRIDEMENT;  Surgeon: Serafina Mitchell, MD;  [facility];  Service: Vascular;  Laterality: Left;     reports that he quit smoking about 24 years ago. His smoking use included cigarettes. He has a 20.00 pack-year smoking history. He has never been exposed to tobacco smoke. He has never used smokeless tobacco. He reports that he does not currently use alcohol. He reports that he does not use drugs.  Allergies  Allergen Reactions   Contrast Media [Iodinated Contrast Media] Hives and Other (See Comments)    Happened "in the 51's"   Other Other (See Comments)    "Transfer Dye" = "Makes me tired"   Acetazolamide Anxiety and Other (See Comments)    Jittery, odd feeling (hyper feeling)    Family History  Problem Relation Age of Onset   Diabetes Father    Stroke Father    Diabetes Brother    Diabetes Brother    Diabetes Brother    Diabetes Brother    Heart failure Mother    Diabetes Mother    Diabetes Other    Coronary artery disease Other     Prior to Admission  medications   Medication Sig Start Date End Date Taking? Authorizing Provider  aspirin 81 MG tablet Take 81 mg by mouth at bedtime.    [provider]  brimonidine (ALPHAGAN) 0.2 % ophthalmic solution Place 1 drop into the right eye 3 (three) times daily.    [provider]  calcium acetate (PHOSLO) 667 MG capsule Take 667-1,334 mg by mouth See admin instructions. Take 1,334 mg by mouth three times a day with meals and 667 mg with each snack 03/25/15   [provider]  carvedilol (COREG) 3.125 MG tablet Take 1 tablet (3.125 mg total) by mouth 2 (two) times  daily with a meal. 06/07/22   Thurnell Lose, MD  ceFAZolin (ANCEF) IVPB Inject 2 g into the vein every Monday, Wednesday, and Friday with hemodialysis. 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 06/07/22 07/12/22  Thurnell Lose, MD  clopidogrel (PLAVIX) 75 MG tablet Take 75 mg by mouth at bedtime.    [provider]  cyanocobalamin 1000 MCG tablet Take 1 tablet (1,000 mcg total) by mouth daily. 06/07/22   Thurnell Lose, MD  docusate sodium (COLACE) 100 MG capsule Take 2 capsules (200 mg total) by mouth 2 (two) times daily. 06/07/22   Thurnell Lose, MD  ferrous sulfate 325 (65 FE) MG tablet Take 1 tablet (325 mg total) by mouth 2 (two) times daily with a meal. 06/07/22   Thurnell Lose, MD  fluticasone (FLONASE) 50 MCG/ACT nasal spray Place 1 spray into both nostrils at bedtime as needed for allergies or rhinitis.    [provider]  folic acid (FOLVITE) 1 MG tablet Take 1 mg by mouth at bedtime.     [provider]  gabapentin (NEURONTIN) 300 MG capsule TAKE 1 CAPSULE(300 MG) BY MOUTH DAILY Patient taking differently: Take 300 mg by mouth at bedtime. 01/07/22   [name], MD  HYDROcodone-acetaminophen (NORCO/VICODIN) 5-325 MG tablet Take 1 tablet by mouth every 6 (six) hours as needed for moderate pain. Take one tab po q 4 hrs prn pain 06/07/22   Thurnell Lose, MD  icosapent Ethyl (VASCEPA) 1 g capsule Take 2 g by mouth 2 (two) times daily. 04/29/22   [provider]  insulin aspart (NOVOLOG) 100 UNIT/ML FlexPen Before each meal 3 times a day, 140-199 - 2 units, 200-250 - 4 units, 251-299 - 6 units,  300-349 - 8 units,  350 or above 10 units. 06/07/22   Thurnell Lose, MD  insulin glargine-yfgn (SEMGLEE) 100 UNIT/ML injection Inject 0.1 mLs (10 Units total) into the skin at bedtime. 06/07/22   Thurnell Lose, MD  methocarbamol (ROBAXIN) 500 MG tablet Take 1 tablet (500 mg total) by mouth 3 (three) times daily. Patient not taking: Reported on 05/24/2022 05/16/22   Kem Parkinson, PA-C  pantoprazole (PROTONIX) 40 MG tablet Take 1 tablet (40 mg total) by mouth daily. 06/07/22   Thurnell Lose, MD  rifampin (RIFADIN) 300 MG capsule Take 1 capsule (300 mg total) by mouth 2 (two) times daily. 06/07/22 07/12/22  Thurnell Lose, MD  rosuvastatin (CRESTOR) 20 MG tablet Take 20 mg by mouth at bedtime. 02/24/15   [provider]  triamcinolone cream (KENALOG) 0.1 % Apply 1 Application topically 2 (two) times daily.    [provider]    Physical Exam: Vitals:   06/24/22 1307 06/24/22 1310 06/24/22 1311 06/24/22 1315  BP:  (!) 132/51  (!) 147/59  Pulse:  87  90  Resp:  18  16  Temp:   98.7 F (37.1 C)   TempSrc:   Oral   SpO2:  91%  100%  Weight: 120.9 kg     Height: 4\' 3"  (1.295 m)  Constitutional: chronically ill appearing ENMT: Mucous membranes are moist Neck: normal, supple, no masses, no thyromegaly Respiratory: on [place],  diminished, no wheezing Cardiovascular: Regular rate and rhythm, no murmurs / rubs / gallops.  Abdomen: no tenderness, no masses palpated. Bowel sounds positive.  Musculoskeletal:L BKA and R AKA Skin: no rashes, lesions, ulcers. No induration Neurologic: CN 2-12 grossly intact. Strength 5/5 in all 4.  Psychiatric: Normal judgment and insight. Alert and oriented x 3. Normal mood.   Labs on Admission: I have personally reviewed following labs and imaging studies  CBC: Recent Labs  Lab 06/24/22 1334  WBC 11.3*  NEUTROABS 8.9*  HGB 9.3*  HCT 30.5*  MCV 101.7*  PLT [id number]   Basic Metabolic Panel: Recent Labs  Lab 06/24/22 1357  NA 131*  K 3.4*  CL 94*  CO2 27  GLUCOSE 119*  BUN 20  CREATININE 4.11*  CALCIUM 9.0   GFR: Estimated Creatinine Clearance: 15.8 mL/min (A) (by C-G formula based on SCr of 4.11 mg/dL (H)). Liver Function Tests: Recent Labs  Lab 06/24/22 1357  AST 16  ALT <5  ALKPHOS 87  BILITOT 0.7  PROT 6.2*  ALBUMIN 2.5*   No results for input(s): "LIPASE", "AMYLASE" in the last 168 hours. No results for input(s): "AMMONIA" in the last 168 hours. Coagulation Profile: No results for input(s): "INR", "PROTIME" in the last 168 hours. Cardiac Enzymes: No results for input(s): "CKTOTAL", "CKMB", "CKMBINDEX", "TROPONINI" in the last 168 hours. BNP (last 3 results) No results for input(s): "PROBNP" in the last 8760 hours. HbA1C: No results for input(s): "HGBA1C" in the last 72 hours. CBG: No results for input(s): "GLUCAP" in the last 168 hours. Lipid Profile: No results for input(s): "CHOL", "HDL", "LDLCALC", "TRIG", "CHOLHDL", "LDLDIRECT" in the last 72 hours. Thyroid Function Tests: No results for input(s): "TSH", "T4TOTAL", "FREET4", "T3FREE", "THYROIDAB" in the last 72 hours. Anemia Panel: No results for input(s): "VITAMINB12", "FOLATE", "FERRITIN", "TIBC", "IRON", "RETICCTPCT" in the last 72 hours. Urine analysis:    Component Value Date/Time    COLORURINE YELLOW 11/18/2010 0135   APPEARANCEUR CLEAR 11/18/2010 0135   LABSPEC 1.010 11/18/2010 0135   PHURINE 6.0 11/18/2010 0135   GLUCOSEU NEGATIVE 11/18/2010 0135   HGBUR NEGATIVE 11/18/2010 0135   BILIRUBINUR NEGATIVE 11/18/2010 0135   [place] 11/18/2010 0135   PROTEINUR NEGATIVE 11/18/2010 0135   UROBILINOGEN 0.2 11/18/2010 0135   NITRITE NEGATIVE 11/18/2010 0135   LEUKOCYTESUR NEGATIVE 11/18/2010 0135     Radiological Exams on Admission: DG Chest Port 1 View  Result Date: 06/24/2022 CLINICAL DATA:  [id number] SOB (shortness of breath) [id number] EXAM: PORTABLE CHEST - 1 VIEW COMPARISON:  05/24/2022 FINDINGS: Cardiac silhouette is prominent. There is pulmonary interstitial prominence with vascular congestion. No focal consolidation. No pneumothorax or. There may be small pleural effusions. Aorta is calcified. There is a left-sided pacer. Right-sided dialysis catheter tip overlies right atrium. IMPRESSION: Findings suggest CHF. Electronically Signed   By: Sammie Bench M.D.   On: 06/24/2022 13:26    EKG: Independently reviewed. sinus  Assessment/Plan Principal Problem:   Volume overload    Volume overload in an ESRD patient -sent from HD for SOB -only got 1 hour so obviously still volume overloaded-- will not be able to get HD until 3/23 here -does not make urine so not able to use lasix in meantime -PRN O2 as needed Filed Weights   06/24/22 1307  Weight: 120.9 kg     Recent hospitalization for Bacteremia with blood cx growing S  [place] with history of HD catheter and AICD device, TEE suspicious for aortic root abscess, AICD vegetation, tricuspid valve endocarditis -  He is on IV antibiotics per ID, underwent tunneled catheter removal by IR on 05/27/2022, new tunneled right IJ dialysis catheter was placed on 05/30/2022 after line holidays.   Per ID - Cefazolin with HD 6 weeks plus rifampin 300 mg PO bid for 6 weeks (EOT 07/08/22) .   Class 1 obesity due to excess  calories with body mass index (BMI) of 33.0 to 33.9 in adult -Estimated body mass index is 72.06 kg/m as calculated from the following:   Height as of this encounter: 4\' 3"  (1.295 m).   Weight as of this encounter: 120.9 kg.     Kidney lesion, native, left -Incidentally noted on CT -Outpatient MRI recommended    ESRD (end stage renal disease) on dialysis ([place]) -Patient on chronic MWF HD -see above  Anemia -Associated with ESRD -monitor   DM type 2 with peripheral vascular complications (HCC) -123456 5.6, good control -SSI  Hypokalemia -fix in HD  TOC consult for return   DVT prophylaxis: heparin  Code Status: full  Family Communication: at bedside Consults called: renal Joelyn Oms)    Admission status: tele obs   At the point of initial evaluation, it is my clinical opinion that admission for OBSERVATION is reasonable and necessary because the patient's presenting complaints in the context of their chronic conditions represent sufficient risk of deterioration or significant morbidity to constitute reasonable grounds for close observation in the hospital setting, but that the patient may be medically stable for discharge from the hospital within 24 to 48 hours.     [name] [facility]   How to contact the [facility] Attending or Consulting provider [place] or covering provider during after hours [place], for this patient?  Check the care team in [facility] and look for a) attending/consulting TRH provider listed and b) the [facility] team listed Log into [email or web address] and use [place]'s universal password to access. If you do not have the password, please contact the hospital operator. Locate the [facility] provider you are looking for under [facility] and page to a number that you can be directly reached. If you still have difficulty reaching the provider, please page the [facility] (Director on Call) for the Hospitalists listed on amion for assistance.  06/24/2022, 2:54 PM

## 2022-06-24 NOTE — ED Triage Notes (Signed)
Pt arrived [facility] from [facility] with c/o SOB. REMS stated RA 95% RA. Pt had 2hrs and 52 minutes left of dialysis of a 4 hrs session.

## 2022-06-24 NOTE — Progress Notes (Signed)
Pt arrived to room 303 via stretcher from ED. Pt moved to bed by staff members x3. Pt A&O x4. SOB with exertion and unable to lie flat for any extended period of time due to SOB. Tele applied per order, IV saline lock intact. Pt oriented to room and safety procedures, states understanding. Call bell in reach, bed in lowest position for safety.

## 2022-06-24 NOTE — Progress Notes (Addendum)
Brief Nephrology Note  Patient not seen, case discussed with ED physician and admitting hospitalist.  Patient presented to ED from HD unit where there were concern for pneumonia and dyspnea.  Here he has normal SpO2.  BPs stable and ok. Portable chest x-ray with some pulmonary interstitial prominence and vascular congestion consistent with edema.  He is afebrile.  WBC is 11.3.  K is 3.4, BUN 20.  Currently receiving cefazolin with each treatment because of recent hospitalization with staph lugdunensis bacteremia with aortic root abscess and ICD vegetation.  He also was taking rifampin.  Rec HD at [place] MWF.  Using [facility].   Discussed with HD nursing.  Plan for completion of dialysis tomorrow.  Orders written.  He will need cefazolin as well.

## 2022-06-24 NOTE — ED Notes (Signed)
Pharmacy messaged for pending ordered medications.

## 2022-06-24 NOTE — Progress Notes (Signed)
Pharmacy Antibiotic Note  [name]. is a 67 y.o. male admitted on 06/24/2022 with staph lugdunensis bacteremia from previous admission. Patient arrived from [facility] dialysis only finishing about 1 hour of his session.  Blood cultures from 2/20 are negative and cultures from 2/21 are NGTD .  Cath tip culture from 2/23 NGTD.  Plan: Continue Cefazolin 2000 mg IV every HD-MWF. Plan is for HD 3/23 so will start dose tomorrow. Monitor labs, c/s, and patient improvement.  Height: 4\' 3"  (129.5 cm) (bilateral amp) Weight: 120.9 kg (266 lb 9.6 oz) IBW/kg (Calculated) : 29.3  Temp (24hrs), Avg:98.7 F (37.1 C), Min:98.7 F (37.1 C), Max:98.7 F (37.1 C)  Recent Labs  Lab 06/24/22 1334 06/24/22 1357  WBC 11.3*  --   CREATININE  --  4.11*     Estimated Creatinine Clearance: 15.8 mL/min (A) (by C-G formula based on SCr of 4.11 mg/dL (H)).    Allergies  Allergen Reactions   Contrast Media [Iodinated Contrast Media] Hives and Other (See Comments)    Happened "in the 80's"   Other Other (See Comments)    "Transfer Dye" = "Makes me tired"   Acetazolamide Anxiety and Other (See Comments)    Jittery, odd feeling (hyper feeling)    Antimicrobials this admission: Vanco 2/20 >>2/21 Cefepime 2/20 >>2/21 Flagyl 2/20 >>2/21 Cefazolin 2/21>> EOT 07/08/22    Microbiology results: 2/23 cath tip cx: ngtd 2/21  BCx: ngtd 2/21 BCx: ngtd  2/21 MRSA PCR: neg 2/20 Bcx: negative 2/15 Bcx from [place]- staph lugdunensis , sensitive to cefazolin, cipro, clindamycin, and vancomycin    Thank you for allowing pharmacy to be a part of this patient's care.  Margot Ables, PharmD Clinical Pharmacist 06/24/2022 3:48 PM

## 2022-06-25 ENCOUNTER — Observation Stay (HOSPITAL_COMMUNITY): Payer: Medicare Other

## 2022-06-25 DIAGNOSIS — E8770 Fluid overload, unspecified: Secondary | ICD-10-CM | POA: Diagnosis not present

## 2022-06-25 LAB — CBC
HCT: 29.9 % — ABNORMAL LOW (ref 39.0–52.0)
Hemoglobin: 9.1 g/dL — ABNORMAL LOW (ref 13.0–17.0)
MCH: 31.2 pg (ref 26.0–34.0)
MCHC: 30.4 g/dL (ref 30.0–36.0)
MCV: 102.4 fL — ABNORMAL HIGH (ref 80.0–100.0)
Platelets: 195 10*3/uL (ref 150–400)
RBC: 2.92 MIL/uL — ABNORMAL LOW (ref 4.22–5.81)
RDW: 17.5 % — ABNORMAL HIGH (ref 11.5–15.5)
WBC: 12.3 10*3/uL — ABNORMAL HIGH (ref 4.0–10.5)
nRBC: 0 % (ref 0.0–0.2)

## 2022-06-25 LAB — BASIC METABOLIC PANEL
Anion gap: 12 (ref 5–15)
BUN: 24 mg/dL — ABNORMAL HIGH (ref 8–23)
CO2: 25 mmol/L (ref 22–32)
Calcium: 9.1 mg/dL (ref 8.9–10.3)
Chloride: 95 mmol/L — ABNORMAL LOW (ref 98–111)
Creatinine, Ser: 4.74 mg/dL — ABNORMAL HIGH (ref 0.61–1.24)
GFR, Estimated: 13 mL/min — ABNORMAL LOW (ref >60)
Glucose, Bld: 112 mg/dL — ABNORMAL HIGH (ref 70–99)
Potassium: 3.5 mmol/L (ref 3.5–5.1)
Sodium: 132 mmol/L — ABNORMAL LOW (ref 135–145)

## 2022-06-25 LAB — GLUCOSE, CAPILLARY
Glucose-Capillary: 101 mg/dL — ABNORMAL HIGH (ref 70–99)
Glucose-Capillary: 134 mg/dL — ABNORMAL HIGH (ref 70–99)
Glucose-Capillary: 154 mg/dL — ABNORMAL HIGH (ref 70–99)

## 2022-06-25 LAB — MRSA NEXT GEN BY PCR, NASAL: MRSA by PCR Next Gen: NOT DETECTED

## 2022-06-25 MED ORDER — ALTEPLASE 2 MG IJ SOLR: 2.0000 mg | Freq: Once | INTRAMUSCULAR | Status: DC | PRN

## 2022-06-25 MED ORDER — CHLORHEXIDINE GLUCONATE CLOTH 2 % EX PADS
6.0000 | MEDICATED_PAD | Freq: Every day | CUTANEOUS | Status: DC
  Administered 2022-06-25 – 2022-07-11 (×14): 6 via TOPICAL

## 2022-06-25 MED ORDER — HEPARIN SODIUM (PORCINE) 1000 UNIT/ML IJ SOLN
INTRAMUSCULAR | Status: AC
  Administered 2022-06-25: 1000 [IU]
  Filled 2022-06-25: qty 5

## 2022-06-25 MED ORDER — HEPARIN SODIUM (PORCINE) 1000 UNIT/ML DIALYSIS
1000.0000 [IU] | INTRAMUSCULAR | Status: DC | PRN
  Administered 2022-06-29 – 2022-07-01 (×2): 1000 [IU]
  Administered 2022-07-04: 3200 [IU]
  Filled 2022-06-25 (×2): qty 1

## 2022-06-25 NOTE — Progress Notes (Addendum)
Patient is complaining of not being able to breath. Patient is on RA currently. O2 saturation is >95%. Upper lobes of lungs sound clear and lower lobes are diminished. MD Eliseo Squires notified.  MD Eliseo Squires states to place back on oxygen and ordered a chest xray.

## 2022-06-25 NOTE — Progress Notes (Signed)
Patient c/o SOB despite having O2 sat in the upper 90s.  Will repeat x ray post HD.  May need another HD vs CT scan to clarify dyspnea. JV

## 2022-06-25 NOTE — Progress Notes (Signed)
HEMODIALYSIS TREATMENT NOTE:   Pt with multiple complaints throughout treatment.  Repeatedly stated "I can't breathe" despite spO2 100% on 1L and >95% on room air.  He repeatedly took his nasal cannula off and, a few minutes later, would then ask "put my oxygen back on."  He asked to stop UF (granted x 15 min), lower goal (granted, to 2L), rub his back, wipe his nose, sit up, lie back, put a pillow under his right hip, rub his leg, pat his back, and moaned "Oh!  Ah!"  Throughout the entire treatment.   Requests/urgency amplified while I provided care to another.  He would then declare "just take me off."    He asked to end treatment as early as 1 hour and 45 minutes into treatment. I explained he needed to complete session, the reason he was in the hospital was for volume unloading, and that he needed Cefazolin.  He would relent, but repeatedly asked "how much longer I got now?"    O2 flow was stopped and pt maintained spO2 >/= 95% on room air.  Eventually, 3 hour session was completed.  Cefazolin was given.  All blood was returned. Net UF 2.3 liters.    Post-dialysis:  06/25/22 1330  Vital Signs  Temp 98.1 F (36.7 C)  Temp Source Oral  Pulse Rate 81  Pulse Rate Source Monitor  Resp 19  BP 133/68  BP Location Right Arm  BP Method Automatic  Patient Position (if appropriate) Sitting  Oxygen Therapy  SpO2 98 %  O2 Device Room Air  Pain Assessment  Pain Scale 0-10  Pain Score 4  Dialysis Weight  Weight 115.1 kg  Type of Weight Post-Dialysis  Post Treatment  Dialyzer Clearance Lightly streaked  Duration of HD Treatment -hour(s) 3 hour(s)  Hemodialysis Intake (mL) 0 mL  Liters Processed 72  Fluid Removed (mL) 2300 mL  Tolerated HD Treatment No (Comment)  Post-Hemodialysis Comments see PN  Education / Care Plan  Dialysis Education Provided Yes  Documented Education in Care Plan Yes  Outpatient Plan of Care Reviewed and on Chart Yes    Rockwell Alexandria, RN [facility]

## 2022-06-25 NOTE — Progress Notes (Signed)
PROGRESS NOTE    [name].  [id number] DOB: 1955-07-10 DOA: 06/24/2022 PCP: [name], MD    Brief Narrative:  [name]. is a 67 y.o. male with medical history significant of recent hospitalization with bacteremia,  ESRD on HD, DM.  Patient was brought in from [place] HD with c/o SOB.  He had over 1/2 of his HD remaining as he only completed 1 of his 4 hours.  Patient denies chest pain, denies fevers, denies chills.  Patient reports he had fallen asleep and woke up coughing and was sent to the ER.  O2 sats were 95% on RA.   No fevers, no chills   In the ER, x ray showed volume overload, elevated BNP, and low K.  ER doc spoke with renal MD who suggested admission and to finish his HD session.  Appears that will not happen until 3/23.    I called the [facility] to figure out why they sent him here and was told by the RN that the NP listened to the patient and did not hear any "air movement" and was concerned for PNA so sent him to the ER without completing his HD.   Assessment and Plan: Volume overload in an ESRD patient -sent from HD for SOB -only got 1 hour so obviously still volume overloaded-- getting HD 3/23 -wean off O2 -no fever so doubt infection -renal consult   Recent hospitalization for Bacteremia with blood cx growing [place] with history of HD catheter and AICD device, TEE suspicious for aortic root abscess, AICD vegetation, tricuspid valve endocarditis -  He is on IV antibiotics per ID, underwent tunneled catheter removal by IR on 05/27/2022, new tunneled right IJ dialysis catheter was placed on 05/30/2022 after line holidays.   Per ID - Cefazolin with HD 6 weeks plus rifampin 300 mg PO bid for 6 weeks (EOT 07/08/22) .   Class 1 obesity due to excess calories with body mass index (BMI) of 33.0 to 33.9 in adult -Estimated body mass index is 72.06 kg/m as calculated from the following:   Height as of this encounter: 4\' 3"  (1.295 m).   Weight as of this  encounter: 120.9 kg.      Kidney lesion, native, left -Incidentally noted on CT -Outpatient MRI recommended    ESRD (end stage renal disease) on dialysis ([place]) -Patient on chronic MWF HD -see above   Anemia -Associated with ESRD -monitor   DM type 2 with peripheral vascular complications (HCC) -123456 5.6, good control -SSI   Hypokalemia -fix in HD   TOC consult [facility]  DVT prophylaxis: heparin injection 5,000 Units Start: 06/24/22 1800    Code Status: Full Code   Disposition Plan:  Level of care: Telemetry Status is: Observation     Consultants:  renal   Subjective: Continues to cough  Objective: Vitals:   06/25/22 0848 06/25/22 0859 06/25/22 0900 06/25/22 0911  BP:      Pulse:      Resp:      Temp:      TempSrc:      SpO2: 98% (!) 87% 97% 97%  Weight:      Height:        Intake/Output Summary (Last 24 hours) at 06/25/2022 1027 Last data filed at 06/25/2022 0500 Gross per 24 hour  Intake 240 ml  Output --  Net 240 ml   Filed Weights   06/24/22 1307 06/24/22 1740  Weight: 120.9 kg  117 kg    Examination:   General: Appearance:    Obese male in no acute distress     Lungs:     respirations unlabored  Heart:    Normal heart rate.    MS:   B/l amputations   Neurologic:   Awake, alert       Data Reviewed: I have personally reviewed following labs and imaging studies  CBC: Recent Labs  Lab 06/24/22 1334 06/25/22 0659  WBC 11.3* 12.3*  NEUTROABS 8.9*  --   HGB 9.3* 9.1*  HCT 30.5* 29.9*  MCV 101.7* 102.4*  PLT 168 0000000   Basic Metabolic Panel: Recent Labs  Lab 06/24/22 1357 06/25/22 0500  NA 131* 132*  K 3.4* 3.5  CL 94* 95*  CO2 27 25  GLUCOSE 119* 112*  BUN 20 24*  CREATININE 4.11* 4.74*  CALCIUM 9.0 9.1   GFR: Estimated Creatinine Clearance: 13.4 mL/min (A) (by C-G formula based on SCr of 4.74 mg/dL (H)). Liver Function Tests: Recent Labs  Lab 06/24/22 1357  AST 16  ALT <5  ALKPHOS 87  BILITOT 0.7   PROT 6.2*  ALBUMIN 2.5*   No results for input(s): "LIPASE", "AMYLASE" in the last 168 hours. No results for input(s): "AMMONIA" in the last 168 hours. Coagulation Profile: No results for input(s): "INR", "PROTIME" in the last 168 hours. Cardiac Enzymes: No results for input(s): "CKTOTAL", "CKMB", "CKMBINDEX", "TROPONINI" in the last 168 hours. BNP (last 3 results) No results for input(s): "PROBNP" in the last 8760 hours. HbA1C: No results for input(s): "HGBA1C" in the last 72 hours. CBG: Recent Labs  Lab 06/24/22 1753 06/24/22 2057 06/25/22 0731  GLUCAP 99 138* 101*   Lipid Profile: No results for input(s): "CHOL", "HDL", "LDLCALC", "TRIG", "CHOLHDL", "LDLDIRECT" in the last 72 hours. Thyroid Function Tests: No results for input(s): "TSH", "T4TOTAL", "FREET4", "T3FREE", "THYROIDAB" in the last 72 hours. Anemia Panel: No results for input(s): "VITAMINB12", "FOLATE", "FERRITIN", "TIBC", "IRON", "RETICCTPCT" in the last 72 hours. Sepsis Labs: No results for input(s): "PROCALCITON", "LATICACIDVEN" in the last 168 hours.  Recent Results (from the past 240 hour(s))  Resp panel by RT-PCR (RSV, Flu A&B, Covid) Anterior Nasal Swab     Status: None   Collection Time: 06/24/22  1:35 PM   Specimen: Anterior Nasal Swab  Result Value Ref Range Status   SARS Coronavirus 2 by RT PCR NEGATIVE NEGATIVE Final    Comment: (NOTE) SARS-CoV-2 target nucleic acids are NOT DETECTED.  The SARS-CoV-2 RNA is generally detectable in upper respiratory specimens during the acute phase of infection. The lowest concentration of SARS-CoV-2 viral copies this assay can detect is 138 copies/mL. A negative result does not preclude SARS-Cov-2 infection and should not be used as the sole basis for treatment or other patient management decisions. A negative result may occur with  improper specimen collection/handling, submission of specimen other than nasopharyngeal swab, presence of viral mutation(s) within  the areas targeted by this assay, and inadequate number of viral copies(<138 copies/mL). A negative result must be combined with clinical observations, patient history, and epidemiological information. The expected result is Negative.  Fact Sheet for Patients:  EntrepreneurPulse.com.au  Fact Sheet for Healthcare Providers:  IncredibleEmployment.be  This test is no t yet approved or cleared by the [place] FDA and  has been authorized for detection and/or diagnosis of SARS-CoV-2 by FDA under an Emergency Use Authorization (EUA). This EUA will remain  in effect (meaning this test can be used) for the duration  of the COVID-19 declaration under Section 564(b)(1) of the Act, 21 U.S.C.section 360bbb-3(b)(1), unless the authorization is terminated  or revoked sooner.       Influenza A by PCR NEGATIVE NEGATIVE Final   Influenza B by PCR NEGATIVE NEGATIVE Final    Comment: (NOTE) The Xpert Xpress SARS-CoV-2/FLU/RSV plus assay is intended as an aid in the diagnosis of influenza from Nasopharyngeal swab specimens and should not be used as a sole basis for treatment. Nasal washings and aspirates are unacceptable for Xpert Xpress SARS-CoV-2/FLU/RSV testing.  Fact Sheet for Patients: EntrepreneurPulse.com.au  Fact Sheet for Healthcare Providers: IncredibleEmployment.be  This test is not yet approved or cleared by the [place] FDA and has been authorized for detection and/or diagnosis of SARS-CoV-2 by FDA under an Emergency Use Authorization (EUA). This EUA will remain in effect (meaning this test can be used) for the duration of the COVID-19 declaration under Section 564(b)(1) of the Act, 21 U.S.C. section 360bbb-3(b)(1), unless the authorization is terminated or revoked.     Resp Syncytial Virus by PCR NEGATIVE NEGATIVE Final    Comment: (NOTE) Fact Sheet for  Patients: EntrepreneurPulse.com.au  Fact Sheet for Healthcare Providers: IncredibleEmployment.be  This test is not yet approved or cleared by the [place] FDA and has been authorized for detection and/or diagnosis of SARS-CoV-2 by FDA under an Emergency Use Authorization (EUA). This EUA will remain in effect (meaning this test can be used) for the duration of the COVID-19 declaration under Section 564(b)(1) of the Act, 21 U.S.C. section 360bbb-3(b)(1), unless the authorization is terminated or revoked.  Performed at [facility], [address] [place], [place] [address]   MRSA Next Gen by PCR, Nasal     Status: None   Collection Time: 06/25/22  5:09 AM   Specimen: Nasal Mucosa; Nasal Swab  Result Value Ref Range Status   MRSA by PCR Next Gen NOT DETECTED NOT DETECTED Final    Comment: (NOTE) The GeneXpert MRSA Assay (FDA approved for NASAL specimens only), is one component of a comprehensive MRSA colonization surveillance program. It is not intended to diagnose MRSA infection nor to guide or monitor treatment for MRSA infections. Test performance is not FDA approved in patients less than 30 years old. Performed at [facility], [address] [place], [place] [address]          Radiology Studies: [facility] Chest [facility] 1 View  Result Date: 06/24/2022 CLINICAL DATA:  [id number] SOB (shortness of breath) [id number] EXAM: PORTABLE CHEST - 1 VIEW COMPARISON:  05/24/2022 FINDINGS: Cardiac silhouette is prominent. There is pulmonary interstitial prominence with vascular congestion. No focal consolidation. No pneumothorax or. There may be small pleural effusions. Aorta is calcified. There is a left-sided pacer. Right-sided dialysis catheter tip overlies right atrium. IMPRESSION: Findings suggest CHF. Electronically Signed   By: Sammie Bench M.D.   On: 06/24/2022 13:26        Scheduled Meds:  carvedilol  3.125 mg Oral BID WC   Chlorhexidine Gluconate  Cloth  6 each Topical Daily   clopidogrel  75 mg Oral QHS   heparin  5,000 Units Subcutaneous Q8H   insulin aspart  0-5 Units Subcutaneous QHS   insulin aspart  0-9 Units Subcutaneous TID WC   insulin glargine-yfgn  10 Units Subcutaneous QHS   pantoprazole  40 mg Oral Daily   rifampin  300 mg Oral BID   Continuous Infusions:  [START ON 06/27/2022]  ceFAZolin (ANCEF) IV      ceFAZolin (ANCEF) IV  LOS: 0 days    Time spent: 45 minutes spent on chart review, discussion with nursing staff, consultants, updating family and interview/physical exam; more than 50% of that time was spent in counseling and/or coordination of care.    [name], DO [facility] Available via Epic secure chat 7am-7pm After these hours, please refer to coverage provider listed on [email or web address] 06/25/2022, 10:27 AM

## 2022-06-25 NOTE — Progress Notes (Addendum)
Order placed by MD Eliseo Squires to wean patient to RA. Patient was weaned form 2L to 1L with an o2 saturation of 98%. Patient was then taken off o2 and o2 saturation went down to 87%. Patient was complaining of SOB. Patient was placed back on 1L with o2 saturation at 98%. Patient is still complaining of SOB. Respiratory Vanessa notified for prn albuterol

## 2022-06-25 NOTE — Care Management Obs Status (Signed)
[facility] NOTIFICATION   Patient Details  Name: [name]. MRN: [id number] Date of Birth: [birth year]   Medicare Observation Status Notification Given:  Yes    Boneta Lucks, RN 06/25/2022, 2:41 PM

## 2022-06-25 NOTE — Progress Notes (Addendum)
Tele has called several times throughout shift stating that patient has all leads off. Upon going to check on patient each time, pt. Has leads off, dangling down. Informed pt. The importance to keep leads on to monitor heart for any abnormalities etc, pt. Nodded that he understands; however pt. Continuously keeps taking leads off. Nurse and nurse tech keep going to apply leads back on and continually ask pt. To please not to take them off, however, pt not very receptive to accepting instruction and education. All safety measures kept and maintained with patient. Will continue to monitor.   Around 4:00am: Pt. Sitting in bed with eyes closed during rounding.   Around 6:30am: pt. Sitting in bed. Pt. Complains that I'm sitting in a hole. Assisted to make patient comfortable in bed. All safety protocols left in place for patient safety.

## 2022-06-25 NOTE — TOC Initial Note (Signed)
Transition of Care (TOC) - Initial/Assessment Note    Patient Details  Name: [name]. MRN: [id number] Date of Birth: 09/17/1955  Transition of Care [facility]) CM/SW Contact:    Boneta Lucks, RN Phone Number: 06/25/2022, 2:42 PM  Clinical Narrative:    Patient is from [facility]. Dialysis patient from [facility] MWF. OBS status explained, Patient will need another night admission, work up continuing. TOC following.  Expected Discharge Plan: Skilled Nursing Facility Barriers to Discharge: Continued Medical Work up   Patient Goals and CMS Choice Patient states their goals for this hospitalization and ongoing recovery are:: return to Rehab CMS Medicare.gov Compare Post Acute Care list provided to:: Patient      Expected Discharge Plan and Services     [facility] Prior Living Arrangements/Services   Activities of Daily Living Home Assistive Devices/Equipment: Wheelchair ADL Screening (condition at time of admission) Patient's cognitive ability adequate to safely complete daily activities?: Yes Is the patient deaf or have difficulty hearing?: No Does the patient have difficulty seeing, even when wearing glasses/contacts?: No Does the patient have difficulty concentrating, remembering, or making decisions?: No Patient able to express need for assistance with ADLs?: Yes Does the patient have difficulty dressing or bathing?: Yes Independently performs ADLs?: No Communication: Needs assistance Is this a change from baseline?: Pre-admission baseline Dressing (OT): Needs assistance Is this a change from baseline?: Pre-admission baseline Grooming: Needs assistance Is this a change from baseline?: Pre-admission baseline Feeding: Needs assistance Is this a change from baseline?: Pre-admission baseline Bathing: Needs assistance Is this a change from baseline?: Pre-admission baseline Toileting: Needs assistance Is this a change from baseline?: Pre-admission  baseline In/Out Bed: Dependent Is this a change from baseline?: Pre-admission baseline Walks in Home: Needs assistance Is this a change from baseline?: Pre-admission baseline Does the patient have difficulty walking or climbing stairs?: Yes Weakness of Legs: Both (bilateral amputee) Weakness of Arms/Hands: None  Permission Sought/Granted      Emotional Assessment       Admission diagnosis:  Volume overload [id number] Systolic congestive heart failure, unspecified HF chronicity (HCC) [I50.20] Patient Active Problem List   Diagnosis Date Noted   Class 1 obesity due to excess calories with body mass index (BMI) of 33.0 to 33.9 in adult 05/28/2022   Bacteremia 05/24/2022   Kidney lesion, native, left 05/24/2022   Gynecomastia 0000000   Complication associated with dialysis catheter 05/25/2021   COVID-19 virus infection 05/25/2021   ESRD (end stage renal disease) on dialysis ([place]) 02/18/2021   Anemia 09/15/2016   Abscess 09/09/2016   Abscess of buttock, right 09/08/2016   S/P bilateral BKA (below knee amputation) ([place]) 09/08/2016   Anemia due to chronic kidney disease 09/08/2016   Chronic combined systolic (congestive) and diastolic (congestive) heart failure ([place]) 06/20/2016   Acute hypoxemic respiratory failure ([place]) 06/19/2016   Volume overload 06/19/2016   AKI (acute kidney injury) ([place]) 06/19/2016   DM (diabetes mellitus), type 2 with peripheral vascular complications ([place]) 0000000   SOB (shortness of breath)    ACHILLES BURSITIS OR TENDINITIS 09/14/2009   PLANTAR FACIITIS 09/14/2009   TIA 09/07/2009   Peripheral vascular disease ([place]) 01/07/2009   Hyperlipidemia 08/27/2008   Essential hypertension 08/27/2008   PCP:  [name], MD Pharmacy:   [facility] [place] - [address] AT [place] [place] [address] Phone: [phone] Fax: [phone]     Social Determinants  of Health  (SDOH) Social History: SDOH Screenings   Food Insecurity: Food Insecurity Present (06/24/2022)  Housing: Low Risk  (06/24/2022)  Transportation Needs: No Transportation Needs (06/24/2022)  Utilities: Not At Risk (06/24/2022)  Tobacco Use: Medium Risk (06/21/2022)   SDOH Interventions: Housing Interventions: Intervention Not Indicated Utilities Interventions: Intervention Not Indicated   Readmission Risk Interventions     No data to display

## 2022-06-26 ENCOUNTER — Inpatient Hospital Stay (HOSPITAL_COMMUNITY): Payer: Medicare Other

## 2022-06-26 DIAGNOSIS — I953 Hypotension of hemodialysis: Secondary | ICD-10-CM | POA: Diagnosis not present

## 2022-06-26 DIAGNOSIS — R34 Anuria and oliguria: Secondary | ICD-10-CM | POA: Diagnosis not present

## 2022-06-26 DIAGNOSIS — Y831 Surgical operation with implant of artificial internal device as the cause of abnormal reaction of the patient, or of later complication, without mention of misadventure at the time of the procedure: Secondary | ICD-10-CM | POA: Diagnosis present

## 2022-06-26 DIAGNOSIS — E8729 Other acidosis: Secondary | ICD-10-CM | POA: Diagnosis not present

## 2022-06-26 DIAGNOSIS — Z1152 Encounter for screening for COVID-19: Secondary | ICD-10-CM | POA: Diagnosis not present

## 2022-06-26 DIAGNOSIS — Z515 Encounter for palliative care: Secondary | ICD-10-CM | POA: Diagnosis not present

## 2022-06-26 DIAGNOSIS — D631 Anemia in chronic kidney disease: Secondary | ICD-10-CM | POA: Diagnosis present

## 2022-06-26 DIAGNOSIS — Z8616 Personal history of COVID-19: Secondary | ICD-10-CM | POA: Diagnosis not present

## 2022-06-26 DIAGNOSIS — I9589 Other hypotension: Secondary | ICD-10-CM | POA: Diagnosis present

## 2022-06-26 DIAGNOSIS — J440 Chronic obstructive pulmonary disease with acute lower respiratory infection: Secondary | ICD-10-CM | POA: Diagnosis present

## 2022-06-26 DIAGNOSIS — T68XXXA Hypothermia, initial encounter: Secondary | ICD-10-CM | POA: Diagnosis not present

## 2022-06-26 DIAGNOSIS — J9601 Acute respiratory failure with hypoxia: Secondary | ICD-10-CM | POA: Diagnosis not present

## 2022-06-26 DIAGNOSIS — Z7189 Other specified counseling: Secondary | ICD-10-CM | POA: Diagnosis not present

## 2022-06-26 DIAGNOSIS — J90 Pleural effusion, not elsewhere classified: Secondary | ICD-10-CM | POA: Diagnosis not present

## 2022-06-26 DIAGNOSIS — Z95828 Presence of other vascular implants and grafts: Secondary | ICD-10-CM | POA: Diagnosis not present

## 2022-06-26 DIAGNOSIS — R0602 Shortness of breath: Secondary | ICD-10-CM | POA: Diagnosis present

## 2022-06-26 DIAGNOSIS — Z66 Do not resuscitate: Secondary | ICD-10-CM | POA: Diagnosis not present

## 2022-06-26 DIAGNOSIS — I5023 Acute on chronic systolic (congestive) heart failure: Secondary | ICD-10-CM | POA: Diagnosis present

## 2022-06-26 DIAGNOSIS — E8770 Fluid overload, unspecified: Secondary | ICD-10-CM | POA: Diagnosis not present

## 2022-06-26 DIAGNOSIS — J810 Acute pulmonary edema: Secondary | ICD-10-CM | POA: Diagnosis not present

## 2022-06-26 DIAGNOSIS — I132 Hypertensive heart and chronic kidney disease with heart failure and with stage 5 chronic kidney disease, or end stage renal disease: Secondary | ICD-10-CM | POA: Diagnosis present

## 2022-06-26 DIAGNOSIS — Z6841 Body Mass Index (BMI) 40.0 and over, adult: Secondary | ICD-10-CM | POA: Diagnosis not present

## 2022-06-26 DIAGNOSIS — J9602 Acute respiratory failure with hypercapnia: Secondary | ICD-10-CM | POA: Diagnosis not present

## 2022-06-26 DIAGNOSIS — E1122 Type 2 diabetes mellitus with diabetic chronic kidney disease: Secondary | ICD-10-CM | POA: Diagnosis present

## 2022-06-26 DIAGNOSIS — J9621 Acute and chronic respiratory failure with hypoxia: Secondary | ICD-10-CM | POA: Diagnosis not present

## 2022-06-26 DIAGNOSIS — R7881 Bacteremia: Secondary | ICD-10-CM | POA: Diagnosis present

## 2022-06-26 DIAGNOSIS — J9622 Acute and chronic respiratory failure with hypercapnia: Secondary | ICD-10-CM | POA: Diagnosis not present

## 2022-06-26 DIAGNOSIS — N186 End stage renal disease: Secondary | ICD-10-CM | POA: Diagnosis present

## 2022-06-26 DIAGNOSIS — I5020 Unspecified systolic (congestive) heart failure: Secondary | ICD-10-CM | POA: Diagnosis not present

## 2022-06-26 DIAGNOSIS — E662 Morbid (severe) obesity with alveolar hypoventilation: Secondary | ICD-10-CM | POA: Diagnosis present

## 2022-06-26 DIAGNOSIS — I330 Acute and subacute infective endocarditis: Secondary | ICD-10-CM | POA: Diagnosis present

## 2022-06-26 DIAGNOSIS — G9341 Metabolic encephalopathy: Secondary | ICD-10-CM | POA: Diagnosis not present

## 2022-06-26 DIAGNOSIS — Z992 Dependence on renal dialysis: Secondary | ICD-10-CM | POA: Diagnosis not present

## 2022-06-26 DIAGNOSIS — E440 Moderate protein-calorie malnutrition: Secondary | ICD-10-CM | POA: Diagnosis present

## 2022-06-26 DIAGNOSIS — I5021 Acute systolic (congestive) heart failure: Secondary | ICD-10-CM | POA: Diagnosis not present

## 2022-06-26 DIAGNOSIS — J189 Pneumonia, unspecified organism: Secondary | ICD-10-CM | POA: Diagnosis present

## 2022-06-26 LAB — HEPATITIS B SURFACE ANTIBODY, QUANTITATIVE: Hep B S AB Quant (Post): 164.8 m[IU]/mL (ref >9.9)

## 2022-06-26 LAB — GLUCOSE, CAPILLARY
Glucose-Capillary: 126 mg/dL — ABNORMAL HIGH (ref 70–99)
Glucose-Capillary: 136 mg/dL — ABNORMAL HIGH (ref 70–99)
Glucose-Capillary: 153 mg/dL — ABNORMAL HIGH (ref 70–99)
Glucose-Capillary: 158 mg/dL — ABNORMAL HIGH (ref 70–99)

## 2022-06-26 MED ORDER — SENNOSIDES-DOCUSATE SODIUM 8.6-50 MG PO TABS
1.0000 | ORAL_TABLET | Freq: Once | ORAL | Status: AC
  Administered 2022-06-26: 1 via ORAL
  Filled 2022-06-26: qty 1

## 2022-06-26 MED ORDER — MELATONIN 3 MG PO TABS
3.0000 mg | ORAL_TABLET | Freq: Every day | ORAL | Status: DC
  Administered 2022-06-27 – 2022-07-04 (×8): 3 mg via ORAL
  Filled 2022-06-26 (×8): qty 1

## 2022-06-26 MED ORDER — DIPHENHYDRAMINE HCL 25 MG PO CAPS
50.0000 mg | ORAL_CAPSULE | Freq: Once | ORAL | Status: AC
  Administered 2022-06-26: 50 mg via ORAL
  Filled 2022-06-26: qty 2

## 2022-06-26 MED ORDER — SALINE SPRAY 0.65 % NA SOLN: 1.0000 | NASAL | Status: DC | PRN

## 2022-06-26 MED ORDER — IOHEXOL 350 MG/ML SOLN
75.0000 mL | Freq: Once | INTRAVENOUS | Status: AC | PRN
  Administered 2022-06-26: 75 mL via INTRAVENOUS

## 2022-06-26 MED ORDER — PREDNISONE 20 MG PO TABS
50.0000 mg | ORAL_TABLET | Freq: Four times a day (QID) | ORAL | Status: AC
  Administered 2022-06-26 (×3): 50 mg via ORAL
  Filled 2022-06-26 (×3): qty 3

## 2022-06-26 MED ORDER — GABAPENTIN 300 MG PO CAPS
300.0000 mg | ORAL_CAPSULE | Freq: Every day | ORAL | Status: DC
  Administered 2022-06-27 – 2022-07-04 (×8): 300 mg via ORAL
  Filled 2022-06-26 (×8): qty 1

## 2022-06-26 MED ORDER — DIPHENHYDRAMINE HCL 50 MG/ML IJ SOLN: 50.0000 mg | Freq: Once | INTRAMUSCULAR | Status: AC

## 2022-06-26 MED ORDER — DICLOFENAC SODIUM 1 % EX GEL
2.0000 g | Freq: Four times a day (QID) | CUTANEOUS | Status: DC
  Administered 2022-06-26 – 2022-07-11 (×38): 2 g via TOPICAL
  Filled 2022-06-26 (×3): qty 100

## 2022-06-26 NOTE — Progress Notes (Signed)
Pt did not sleep any during the night. Pt reported pain multiple times. PRN Norco and Tylenol alternated. Pt presents with a flat affect and is irritable and demanding at times. Pt reported feeling dyspneic. SP02 checked and pt was 98% on 1 L. Pt removed oxygen, gown, and telemetry leads several times during the night. I attempted to reinforce the importance of keeping telemetry leads on and pt has continued to be non-compliant.

## 2022-06-26 NOTE — Progress Notes (Signed)
Brief nephrology note  Patient underwent dialysis yesterday.  He had a lot of issues related to pain.  Seems like he may have had some cramping and his ultrafiltration was ultimately limited to only 2.3 L with a 3-hour session.  He is maintaining saturations but continues to complain of shortness of breath.  Chest x-ray continues to show some pulmonary vascular congestion.  Discussed with primary team.  May need to evaluate heart failure more definitively as well as other causes of shortness of breath.  Because his ultrafiltration was ultimately limited yesterday and he is maintaining saturations I do not think he needs urgent dialysis today but we will plan on dialysis tomorrow.

## 2022-06-26 NOTE — Progress Notes (Signed)
Patient states that his left arm is numb. Patient is able to grip my hands. The right hand is a little stronger than left. Patient is also able to hold both arms out in front with no drift. MD Eliseo Squires notified.

## 2022-06-26 NOTE — Progress Notes (Signed)
PROGRESS NOTE    [name] Pick.  KF:[id number] DOB: 12/19/1955 DOA: 06/24/2022 PCP: [name], MD    Brief Narrative:  [name] Pick. is a 67 y.o. male with medical history significant of recent hospitalization with bacteremia,  ESRD on HD, DM.  Patient was brought in from [facility] with c/o SOB.  He had over 1/2 of his HD remaining as he only completed 1 of his 4 hours.  Patient denies chest pain, denies fevers, denies chills.  Patient reports he had fallen asleep and woke up coughing and was sent to the ER.  O2 sats were 95% on RA.   No fevers, no chills   In the ER, x ray showed volume overload, elevated BNP, and low K.  ER doc spoke with renal MD who suggested admission and to finish his HD session.  Appears that will not happen until 3/23.    I called the [facility] to figure out why they sent him here and was told by the RN that the NP listened to the patient and did not hear any "air movement" and was concerned for PNA so sent him to the ER without completing his HD.   Assessment and Plan: Volume overload in an ESRD patient -sent from HD for SOB -only got 1 hour so obviously still volume overloaded-- getting HD 3/23 -wean off O2 -no fever so doubt infection -renal consult appreciated -not abiding by fluid restriction here so doubt he follows at SNF as well -will get CTA after pre-medication to r/o emboli   Recent hospitalization for Bacteremia with blood cx growing [place] with history of HD catheter and AICD device, TEE suspicious for aortic root abscess, AICD vegetation, tricuspid valve endocarditis -  He is on IV antibiotics per ID, underwent tunneled catheter removal by IR on 05/27/2022, new tunneled right IJ dialysis catheter was placed on 05/30/2022 after line holidays.   Per ID - Cefazolin with HD 6 weeks plus rifampin 300 mg PO bid for 6 weeks (EOT 07/08/22) .   Class 1 obesity due to excess calories with body mass index (BMI) of 33.0 to 33.9 in  adult -Estimated body mass index is 72.06 kg/m as calculated from the following:   Height as of this encounter: 4\' 3"  (1.295 m).   Weight as of this encounter: 120.9 kg.      Kidney lesion, native, left -Incidentally noted on CT -Outpatient MRI recommended    ESRD (end stage renal disease) on dialysis ([place]) -Patient on chronic MWF HD -see above   Anemia -Associated with ESRD -monitor   DM type 2 with peripheral vascular complications (HCC) -123456 5.6, good control -SSI   Hypokalemia -fix in HD   TOC consult for return to [facility]  DVT prophylaxis: heparin injection 5,000 Units Start: 06/24/22 1800    Code Status: Full Code   Disposition Plan:  Level of care: Telemetry      Consultants:  renal   Subjective: Asking for more water Still c/o SOB  Objective: Vitals:   06/25/22 1642 06/25/22 2013 06/26/22 0327 06/26/22 0806  BP: (!) 132/51 101/88 (!) 143/59 (!) 142/80  Pulse: 93 91 78 83  Resp:  18 18   Temp:  97.8 F (36.6 C) (!) 97.5 F (36.4 C)   TempSrc:  Oral Oral   SpO2:  99% 98%   Weight:      Height:        Intake/Output Summary (Last 24 hours) at 06/26/2022 0956 Last  data filed at 06/25/2022 1851 Gross per 24 hour  Intake 340 ml  Output 2300 ml  Net -1960 ml   Filed Weights   06/24/22 1740 06/25/22 1010 06/25/22 1330  Weight: 117 kg 117.4 kg 115.1 kg    Examination:   General: Appearance:    Obese male in no acute distress     Lungs:     respirations unlabored, diminished  Heart:    Normal heart rate.    MS:   B/l amputations   Neurologic:   Awake, alert, uncooperative       Data Reviewed: I have personally reviewed following labs and imaging studies  CBC: Recent Labs  Lab 06/24/22 1334 06/25/22 0659  WBC 11.3* 12.3*  NEUTROABS 8.9*  --   HGB 9.3* 9.1*  HCT 30.5* 29.9*  MCV 101.7* 102.4*  PLT 168 0000000   Basic Metabolic Panel: Recent Labs  Lab 06/24/22 1357 06/25/22 0500  NA 131* 132*  K 3.4* 3.5  CL 94* 95*  CO2  27 25  GLUCOSE 119* 112*  BUN 20 24*  CREATININE 4.11* 4.74*  CALCIUM 9.0 9.1   GFR: Estimated Creatinine Clearance: 13.2 mL/min (A) (by C-G formula based on SCr of 4.74 mg/dL (H)). Liver Function Tests: Recent Labs  Lab 06/24/22 1357  AST 16  ALT <5  ALKPHOS 87  BILITOT 0.7  PROT 6.2*  ALBUMIN 2.5*   No results for input(s): "LIPASE", "AMYLASE" in the last 168 hours. No results for input(s): "AMMONIA" in the last 168 hours. Coagulation Profile: No results for input(s): "INR", "PROTIME" in the last 168 hours. Cardiac Enzymes: No results for input(s): "CKTOTAL", "CKMB", "CKMBINDEX", "TROPONINI" in the last 168 hours. BNP (last 3 results) No results for input(s): "PROBNP" in the last 8760 hours. HbA1C: No results for input(s): "HGBA1C" in the last 72 hours. CBG: Recent Labs  Lab 06/24/22 2057 06/25/22 0731 06/25/22 1610 06/25/22 2015 06/26/22 0731  GLUCAP 138* 101* 134* 154* 126*   Lipid Profile: No results for input(s): "CHOL", "HDL", "LDLCALC", "TRIG", "CHOLHDL", "LDLDIRECT" in the last 72 hours. Thyroid Function Tests: No results for input(s): "TSH", "T4TOTAL", "FREET4", "T3FREE", "THYROIDAB" in the last 72 hours. Anemia Panel: No results for input(s): "VITAMINB12", "FOLATE", "FERRITIN", "TIBC", "IRON", "RETICCTPCT" in the last 72 hours. Sepsis Labs: No results for input(s): "PROCALCITON", "LATICACIDVEN" in the last 168 hours.  Recent Results (from the past 240 hour(s))  Resp panel by RT-PCR (RSV, Flu A&B, Covid) Anterior Nasal Swab     Status: None   Collection Time: 06/24/22  1:35 PM   Specimen: Anterior Nasal Swab  Result Value Ref Range Status   SARS Coronavirus 2 by RT PCR NEGATIVE NEGATIVE Final    Comment: (NOTE) SARS-CoV-2 target nucleic acids are NOT DETECTED.  The SARS-CoV-2 RNA is generally detectable in upper respiratory specimens during the acute phase of infection. The lowest concentration of SARS-CoV-2 viral copies this assay can detect  is 138 copies/mL. A negative result does not preclude SARS-Cov-2 infection and should not be used as the sole basis for treatment or other patient management decisions. A negative result may occur with  improper specimen collection/handling, submission of specimen other than nasopharyngeal swab, presence of viral mutation(s) within the areas targeted by this assay, and inadequate number of viral copies(<138 copies/mL). A negative result must be combined with clinical observations, patient history, and epidemiological information. The expected result is Negative.  Fact Sheet for Patients:  EntrepreneurPulse.com.au  Fact Sheet for Healthcare Providers:  IncredibleEmployment.be  This test  is no t yet approved or cleared by the [place] and  has been authorized for detection and/or diagnosis of SARS-CoV-2 by FDA under an Emergency Use Authorization (EUA). This EUA will remain  in effect (meaning this test can be used) for the duration of the COVID-19 declaration under Section 564(b)(1) of the Act, 21 U.S.C.section 360bbb-3(b)(1), unless the authorization is terminated  or revoked sooner.       Influenza A by PCR NEGATIVE NEGATIVE Final   Influenza B by PCR NEGATIVE NEGATIVE Final    Comment: (NOTE) The Xpert Xpress SARS-CoV-2/FLU/RSV plus assay is intended as an aid in the diagnosis of influenza from Nasopharyngeal swab specimens and should not be used as a sole basis for treatment. Nasal washings and aspirates are unacceptable for Xpert Xpress SARS-CoV-2/FLU/RSV testing.  Fact Sheet for Patients: EntrepreneurPulse.com.au  Fact Sheet for Healthcare Providers: IncredibleEmployment.be  This test is not yet approved or cleared by the [place] FDA and has been authorized for detection and/or diagnosis of SARS-CoV-2 by FDA under an Emergency Use Authorization (EUA). This EUA will remain in effect  (meaning this test can be used) for the duration of the COVID-19 declaration under Section 564(b)(1) of the Act, 21 U.S.C. section 360bbb-3(b)(1), unless the authorization is terminated or revoked.     Resp Syncytial Virus by PCR NEGATIVE NEGATIVE Final    Comment: (NOTE) Fact Sheet for Patients: EntrepreneurPulse.com.au  Fact Sheet for Healthcare Providers: IncredibleEmployment.be  This test is not yet approved or cleared by the [place] FDA and has been authorized for detection and/or diagnosis of SARS-CoV-2 by FDA under an Emergency Use Authorization (EUA). This EUA will remain in effect (meaning this test can be used) for the duration of the COVID-19 declaration under Section 564(b)(1) of the Act, 21 U.S.C. section 360bbb-3(b)(1), unless the authorization is terminated or revoked.  Performed at [facility], [address] [place], [place] [address]   MRSA Next Gen by PCR, Nasal     Status: None   Collection Time: 06/25/22  5:09 AM   Specimen: Nasal Mucosa; Nasal Swab  Result Value Ref Range Status   MRSA by PCR Next Gen NOT DETECTED NOT DETECTED Final    Comment: (NOTE) The GeneXpert MRSA Assay (FDA approved for NASAL specimens only), is one component of a comprehensive MRSA colonization surveillance program. It is not intended to diagnose MRSA infection nor to guide or monitor treatment for MRSA infections. Test performance is not FDA approved in patients less than 63 years old. Performed at [facility], [address], [place], [place] [address]          Radiology Studies: DG CHEST PORT 1 VIEW  Result Date: 06/25/2022 CLINICAL DATA:  Volume overload EXAM: PORTABLE CHEST 1 VIEW COMPARISON:  06/24/2022 FINDINGS: Single frontal view of the chest demonstrates stable right internal jugular dialysis catheter and single lead AICD. Cardiac silhouette is markedly enlarged, but stable. There is persistent vascular congestion, with  bibasilar veiling opacities again noted right greater than left. No pneumothorax. No acute bony abnormalities. IMPRESSION: 1. Stable findings of congestive heart failure. Electronically Signed   By: Randa Ngo M.D.   On: 06/25/2022 15:54   DG Chest Port 1 View  Result Date: 06/24/2022 CLINICAL DATA:  [id number] SOB (shortness of breath) [id number] EXAM: PORTABLE CHEST - 1 VIEW COMPARISON:  05/24/2022 FINDINGS: Cardiac silhouette is prominent. There is pulmonary interstitial prominence with vascular congestion. No focal consolidation. No pneumothorax or. There may be small pleural effusions. Aorta is  calcified. There is a left-sided pacer. Right-sided dialysis catheter tip overlies right atrium. IMPRESSION: Findings suggest CHF. Electronically Signed   By: Sammie Bench M.D.   On: 06/24/2022 13:26        Scheduled Meds:  carvedilol  3.125 mg Oral BID WC   Chlorhexidine Gluconate Cloth  6 each Topical Daily   clopidogrel  75 mg Oral QHS   diphenhydrAMINE  50 mg Oral Once   Or   diphenhydrAMINE  50 mg Intravenous Once   heparin  5,000 Units Subcutaneous Q8H   insulin aspart  0-5 Units Subcutaneous QHS   insulin aspart  0-9 Units Subcutaneous TID WC   insulin glargine-yfgn  10 Units Subcutaneous QHS   pantoprazole  40 mg Oral Daily   predniSONE  50 mg Oral Q6H   rifampin  300 mg Oral BID   Continuous Infusions:  [START ON 06/27/2022]  ceFAZolin (ANCEF) IV       LOS: 0 days    Time spent: 45 minutes spent on chart review, discussion with nursing staff, consultants, updating family and interview/physical exam; more than 50% of that time was spent in counseling and/or coordination of care.    [name], DO [facility] Available via Epic secure chat 7am-7pm After these hours, please refer to coverage provider listed on [email or web address] 06/26/2022, 9:56 AM

## 2022-06-26 NOTE — Progress Notes (Addendum)
patient is complaining of chest pain in the center of his chest. Pt states it feels tight. vitals are 149/84, hr 82, o2 100 on 2l, rr 18. patient is requesting his oxygen to be bumped up to 3L. This nurse asked patient if he is feeling anxious and he doesn't respond. MD Eliseo Squires notified.  MD Eliseo Squires states ok to bump o2 up to 3L until patient receives his CT scan tonight.   [id number] Patient is now complaining of an echo in his right ear and would like it to be checked out. MD Eliseo Squires notified.

## 2022-06-26 NOTE — Progress Notes (Signed)
   06/26/22 1025  Vitals  Temp (!) 97.5 F (36.4 C)  Temp Source Oral  BP 139/63  MAP (mmHg) 85  Pulse Rate 79  Resp 19  Level of Consciousness  Level of Consciousness Alert  MEWS COLOR  MEWS Score Color Green  Oxygen Therapy  SpO2 100 %  O2 Device Nasal Cannula  O2 Flow Rate (L/min) 2 L/min  MEWS Score  MEWS Temp 0  MEWS Systolic 0  MEWS Pulse 0  MEWS RR 0  MEWS LOC 0  MEWS Score 0   Patient states he feels "shakey" patient requested temp and bp readings. Vitals taken. MD Eliseo Squires notified.

## 2022-06-26 NOTE — Progress Notes (Addendum)
Patient is complaining of needing a bowel movement. Patient states that he has not went to the bathroom for a bowel movement since Thursday. MD Eliseo Squires notified.  MD Eliseo Squires ordered senokot

## 2022-06-26 NOTE — Progress Notes (Signed)
Attempted to call [name] from [facility] rehad back with no answer.

## 2022-06-27 ENCOUNTER — Encounter (HOSPITAL_COMMUNITY): Payer: Self-pay | Admitting: Internal Medicine

## 2022-06-27 ENCOUNTER — Inpatient Hospital Stay (HOSPITAL_COMMUNITY): Payer: Medicare Other

## 2022-06-27 ENCOUNTER — Other Ambulatory Visit (HOSPITAL_COMMUNITY): Payer: Self-pay | Admitting: *Deleted

## 2022-06-27 DIAGNOSIS — J9602 Acute respiratory failure with hypercapnia: Secondary | ICD-10-CM

## 2022-06-27 DIAGNOSIS — E8729 Other acidosis: Secondary | ICD-10-CM

## 2022-06-27 DIAGNOSIS — J9601 Acute respiratory failure with hypoxia: Secondary | ICD-10-CM | POA: Diagnosis not present

## 2022-06-27 DIAGNOSIS — J90 Pleural effusion, not elsewhere classified: Secondary | ICD-10-CM

## 2022-06-27 DIAGNOSIS — I5020 Unspecified systolic (congestive) heart failure: Secondary | ICD-10-CM

## 2022-06-27 DIAGNOSIS — T68XXXA Hypothermia, initial encounter: Secondary | ICD-10-CM | POA: Diagnosis not present

## 2022-06-27 DIAGNOSIS — I5021 Acute systolic (congestive) heart failure: Secondary | ICD-10-CM | POA: Diagnosis not present

## 2022-06-27 DIAGNOSIS — I5023 Acute on chronic systolic (congestive) heart failure: Secondary | ICD-10-CM | POA: Diagnosis not present

## 2022-06-27 LAB — CBC
HCT: 29.6 % — ABNORMAL LOW (ref 39.0–52.0)
Hemoglobin: 8.9 g/dL — ABNORMAL LOW (ref 13.0–17.0)
MCH: 30.7 pg (ref 26.0–34.0)
MCHC: 30.1 g/dL (ref 30.0–36.0)
MCV: 102.1 fL — ABNORMAL HIGH (ref 80.0–100.0)
Platelets: 175 10*3/uL (ref 150–400)
RBC: 2.9 MIL/uL — ABNORMAL LOW (ref 4.22–5.81)
RDW: 17.2 % — ABNORMAL HIGH (ref 11.5–15.5)
WBC: 11.4 10*3/uL — ABNORMAL HIGH (ref 4.0–10.5)
nRBC: 0 % (ref 0.0–0.2)

## 2022-06-27 LAB — COMPREHENSIVE METABOLIC PANEL
ALT: <5 U/L (ref 0–44)
AST: 14 U/L — ABNORMAL LOW (ref 15–41)
Albumin: 2.6 g/dL — ABNORMAL LOW (ref 3.5–5.0)
Alkaline Phosphatase: 84 U/L (ref 38–126)
Anion gap: 13 (ref 5–15)
BUN: 26 mg/dL — ABNORMAL HIGH (ref 8–23)
CO2: 24 mmol/L (ref 22–32)
Calcium: 9 mg/dL (ref 8.9–10.3)
Chloride: 92 mmol/L — ABNORMAL LOW (ref 98–111)
Creatinine, Ser: 4.7 mg/dL — ABNORMAL HIGH (ref 0.61–1.24)
GFR, Estimated: 13 mL/min — ABNORMAL LOW (ref >60)
Glucose, Bld: 157 mg/dL — ABNORMAL HIGH (ref 70–99)
Potassium: 4.2 mmol/L (ref 3.5–5.1)
Sodium: 129 mmol/L — ABNORMAL LOW (ref 135–145)
Total Bilirubin: 1 mg/dL (ref 0.3–1.2)
Total Protein: 6.3 g/dL — ABNORMAL LOW (ref 6.5–8.1)

## 2022-06-27 LAB — ECHOCARDIOGRAM LIMITED
Area-P 1/2: 3.16 cm2
Height: 51 in
S' Lateral: 5.3 cm
Weight: 4059.99 oz

## 2022-06-27 LAB — PROCALCITONIN: Procalcitonin: 0.86 ng/mL

## 2022-06-27 LAB — BLOOD GAS, ARTERIAL
Acid-Base Excess: 0.4 mmol/L (ref 0.0–2.0)
Acid-base deficit: 1.4 mmol/L (ref 0.0–2.0)
Bicarbonate: 29.1 mmol/L — ABNORMAL HIGH (ref 20.0–28.0)
Bicarbonate: 30.4 mmol/L — ABNORMAL HIGH (ref 20.0–28.0)
Drawn by: 22223
Drawn by: 41977
FIO2: 24 %
FIO2: 35 %
O2 Saturation: 90.8 %
O2 Saturation: 33.7 %
Patient temperature: 37
Patient temperature: 37
pCO2 arterial: 78 mmHg (ref 32–48)
pCO2 arterial: 76 mmHg (ref 32–48)
pH, Arterial: 7.18 — CL (ref 7.35–7.45)
pH, Arterial: 7.21 — ABNORMAL LOW (ref 7.35–7.45)
pO2, Arterial: 61 mmHg — ABNORMAL LOW (ref 83–108)
pO2, Arterial: <31 mmHg — CL (ref 83–108)

## 2022-06-27 LAB — TROPONIN I (HIGH SENSITIVITY): Troponin I (High Sensitivity): 178 ng/L (ref <18)

## 2022-06-27 LAB — GLUCOSE, CAPILLARY
Glucose-Capillary: 149 mg/dL — ABNORMAL HIGH (ref 70–99)
Glucose-Capillary: 151 mg/dL — ABNORMAL HIGH (ref 70–99)
Glucose-Capillary: 125 mg/dL — ABNORMAL HIGH (ref 70–99)
Glucose-Capillary: 105 mg/dL — ABNORMAL HIGH (ref 70–99)
Glucose-Capillary: 111 mg/dL — ABNORMAL HIGH (ref 70–99)

## 2022-06-27 LAB — LACTIC ACID, PLASMA
Lactic Acid, Venous: 1.5 mmol/L (ref 0.5–1.9)
Lactic Acid, Venous: 1.4 mmol/L (ref 0.5–1.9)

## 2022-06-27 LAB — MAGNESIUM: Magnesium: 2.3 mg/dL (ref 1.7–2.4)

## 2022-06-27 LAB — TSH: TSH: 5.166 u[IU]/mL — ABNORMAL HIGH (ref 0.350–4.500)

## 2022-06-27 LAB — PHOSPHORUS: Phosphorus: 4.7 mg/dL — ABNORMAL HIGH (ref 2.5–4.6)

## 2022-06-27 MED ORDER — PERFLUTREN LIPID MICROSPHERE
1.0000 mL | INTRAVENOUS | Status: AC | PRN
  Administered 2022-06-27: 4 mL via INTRAVENOUS

## 2022-06-27 MED ORDER — HALOPERIDOL LACTATE 5 MG/ML IJ SOLN
2.0000 mg | Freq: Four times a day (QID) | INTRAMUSCULAR | Status: DC | PRN
  Administered 2022-06-27: 2 mg via INTRAVENOUS
  Filled 2022-06-27: qty 1

## 2022-06-27 MED ORDER — HEPARIN SODIUM (PORCINE) 1000 UNIT/ML IJ SOLN
INTRAMUSCULAR | Status: AC
  Administered 2022-06-27: 3200 [IU]
  Filled 2022-06-27: qty 6

## 2022-06-27 MED ORDER — ORAL CARE MOUTH RINSE: 15.0000 mL | OROMUCOSAL | Status: DC | PRN

## 2022-06-27 MED ORDER — SORBITOL 70 % SOLN
15.0000 mL | Freq: Every day | Status: DC | PRN
  Filled 2022-06-27: qty 30

## 2022-06-27 MED ORDER — ALBUMIN HUMAN 25 % IV SOLN
25.0000 g | Freq: Once | INTRAVENOUS | Status: AC
  Administered 2022-06-27: 25 g via INTRAVENOUS

## 2022-06-27 MED ORDER — HALOPERIDOL LACTATE 5 MG/ML IJ SOLN
5.0000 mg | Freq: Four times a day (QID) | INTRAMUSCULAR | Status: DC | PRN
  Administered 2022-06-27: 5 mg via INTRAVENOUS
  Filled 2022-06-27: qty 1

## 2022-06-27 NOTE — Progress Notes (Signed)
Patient placed on BiPAP for lethargy and increased PCO2 after blood gas.

## 2022-06-27 NOTE — Consult Note (Signed)
[name]. Admit Date: 06/24/2022 06/27/2022 [name] Requesting Physician:  Eliseo Squires DO  Reason for Consult:  ESRD, SOB  HPI:  1M ESRD MWF [facility] presented to the ED 3/22 from outpatient dialysis with concern for worsening dyspnea.  He did not complete his full treatment but there was concern for pneumonia after he awoke from sleep with a cough.  Workup identified pulmonary edema without significant concern for pneumonia.  He received HD on 3/23 here at [facility] which was cut short because of discomfort.  2.3 L removed.  Overnight he developed hypercapnic respiratory failure and now is in the ICU on BiPAP.  ABG 7.1 8/78/61.  Labs this morning with K4.2, bicarbonate 24, BUN 26.  Afebrile.  He awakens to stimuli but falls back to sleep, very groggy.  CTA of the chest yesterday with no evidence of pulmonary emboli.  Findings consistent with CHF with edema and bilateral effusions.  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  ROS Patient not able to participate fully in review of systems. As able, balance of 12 systems is negative w/ exceptions as above  PMH  Past Medical History:  Diagnosis Date   AICD (automatic cardioverter/defibrillator) present    Anemia    Arthritis    Cerebrovascular disease    CHF (congestive heart failure) (HCC)    Chronic kidney disease    ESRD, MWF HD   COVID 2022   moderate   Diabetes mellitus    type II   History of TIAs    Hyperlipidemia    Hypertension    Nonischemic cardiomyopathy (HCC)    PVD (peripheral vascular disease) (HCC)    s/p B BKA   Shortness of breath dyspnea    with exertion    Skin disease    Rare   Stroke ([place])    "light stroke"   Tobacco abuse     PSH  Past Surgical History:  Procedure Laterality Date   A/V FISTULAGRAM Left 06/24/2016   Procedure: A/V Fistulagram;  Surgeon: Elam Dutch, MD;  Location: [place] [facility];  Service: Cardiovascular;  Laterality: Left;   A/V FISTULAGRAM N/A 01/17/2017   Procedure: A/V Fistulagram - left arm;  Surgeon: Serafina Mitchell, MD;  Location: [place] [facility];  Service: Cardiovascular;  Laterality: N/A;   AORTIC ARCH ANGIOGRAPHY N/A 10/21/2021   Procedure: AORTIC ARCH ANGIOGRAPHY;  Surgeon: Marty Heck, MD;  Location: [place] [facility];  Service: Cardiovascular;  Laterality: N/A;   AV FISTULA PLACEMENT Left 04/08/2015   Procedure: Creation of Left arm BRACHIOCEPHALIC ARTERIOVENOUS  FISTULA ;  Surgeon: Mal Misty, MD;  Location: [place];  Service: Vascular;  Laterality: Left;   AV FISTULA PLACEMENT Left 10/27/2020   Procedure: LEFT ARM ARTERIOVENOUS (AV) FISTULA CREATION;  Surgeon: Rosetta Posner, MD;  Location: [facility];  Service: Vascular;  Laterality: Left;   AV FISTULA PLACEMENT Left 06/29/2021   Procedure: INSERTION OF LEFT ARM ARTERIOVENOUS (AV) GORE-TEX GRAFT;  Surgeon: Rosetta Posner, MD;  Location: [facility];  Service: Vascular;  Laterality: Left;   [place] Left 01/12/2021   Procedure: LEFT ARM SECOND STAGE BASILIC VEIN TRANSPOSITION;  Surgeon: Rosetta Posner, MD;  Location: [facility];  Service: Vascular;  Laterality: Left;   CARDIAC DEFIBRILLATOR PLACEMENT     CARDIAC DEFIBRILLATOR PLACEMENT     CATARACT EXTRACTION W/PHACO Right 03/17/2015   Procedure: CATARACT EXTRACTION PHACO AND INTRAOCULAR LENS PLACEMENT ([place]);  Surgeon: Rutherford Guys, MD;  Location: [facility];  Service: Ophthalmology;  Laterality: Right;  CDE: 6.59   EXCHANGE OF A DIALYSIS CATHETER Right 02/08/2022   Procedure: EXCHANGE OF A TUNNELED DIALYSIS CATHETER;  Surgeon: Rosetta Posner, MD;  Location: [place];  Service: Vascular;  Laterality: Right;   EYE SURGERY Right    Cataract   IR FLUORO GUIDE CV LINE  RIGHT  05/26/2021   IR FLUORO GUIDE CV LINE RIGHT  05/30/2022   IR PTA VENOUS EXCEPT DIALYSIS CIRCUIT  05/26/2021   IR US GUIDE VASC ACCESS RIGHT  05/30/2022   LEG AMPUTATION BELOW KNEE     bilateral   LIGATION ARTERIOVENOUS GORTEX GRAFT Left 09/14/2021   Procedure: LIGATION OF LEFT ARM ARTERIOVENOUS GORTEX GRAFT;  Surgeon: Broadus John, MD;  Location: [place];  Service: Vascular;  Laterality: Left;  PERIPHERAL NERVE BLOCK   PERIPHERAL VASCULAR INTERVENTION  01/17/2017   Procedure: PERIPHERAL VASCULAR INTERVENTION;  Surgeon: Serafina Mitchell, MD;  Location: [place] [facility];  Service: Cardiovascular;;  PTA  left arm fistula   TEE WITHOUT CARDIOVERSION N/A 05/31/2022   Procedure: TRANSESOPHAGEAL ECHOCARDIOGRAM (TEE);  Surgeon: Donato Heinz, MD;  Location: [place];  Service: Cardiovascular;  Laterality: N/A;   UPPER EXTREMITY ANGIOGRAPHY N/A 10/21/2021   Procedure: Upper Extremity Angiography;  Surgeon: Marty Heck, MD;  Location: [place] [facility];  Service: Cardiovascular;  Laterality: N/A;   WOUND DEBRIDEMENT Left 02/18/2021   Procedure: LEFT ARM DEBRIDEMENT;  Surgeon: Serafina Mitchell, MD;  Location: [facility];  Service: Vascular;  Laterality: Left;   FH  Family History  Problem Relation Age of Onset   Diabetes Father    Stroke Father    Diabetes Brother    Diabetes Brother    Diabetes Brother    Diabetes Brother    Heart failure Mother    Diabetes Mother    Diabetes Other    Coronary artery disease Other    SH  reports that he quit smoking about 24 years ago. His smoking use included cigarettes. He has a 20.00 pack-year smoking history. He has never been exposed to tobacco smoke. He has never used smokeless tobacco. He reports that he does not currently use alcohol. He reports that he does not use drugs. Allergies  Allergies  Allergen Reactions   Contrast Media [Iodinated Contrast Media] Hives and Other (See Comments)    Happened "in the 74's"   Other Other  (See Comments)    "Transfer Dye" = "Makes me tired"   Acetazolamide Anxiety and Other (See Comments)    Jittery, odd feeling (hyper feeling)   Home medications Prior to Admission medications   Medication Sig Start Date End Date Taking? Authorizing Provider  aspirin 81 MG tablet Take 81 mg by mouth at bedtime.    [provider]  brimonidine (ALPHAGAN) 0.2 % ophthalmic solution Place 1 drop into the right eye 3 (three) times daily.    [provider]  calcium acetate (PHOSLO) 667 MG capsule Take 667-1,334 mg by mouth See admin instructions. Take 1,334 mg by mouth three times a day with meals and 667 mg with each snack 03/25/15   [provider]  carvedilol (COREG) 3.125 MG tablet Take 1 tablet (3.125 mg total) by mouth 2 (two)  times daily with a meal. 06/07/22   Thurnell Lose, MD  ceFAZolin (ANCEF) IVPB Inject 2 g into the vein every Monday, Wednesday, and Friday with hemodialysis. 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 06/07/22 07/12/22  Thurnell Lose, MD  clopidogrel (PLAVIX) 75 MG tablet Take 75 mg by mouth at bedtime.    [provider]  cyanocobalamin 1000 MCG tablet Take 1 tablet (1,000 mcg total) by mouth daily. 06/07/22   Thurnell Lose, MD  docusate sodium (COLACE) 100 MG capsule Take 2 capsules (200 mg total) by mouth 2 (two) times daily. 06/07/22   Thurnell Lose, MD  ferrous sulfate 325 (65 FE) MG tablet Take 1 tablet (325 mg total) by mouth 2 (two) times daily with a meal. 06/07/22   Thurnell Lose, MD  fluticasone (FLONASE) 50 MCG/ACT nasal  spray Place 1 spray into both nostrils at bedtime as needed for allergies or rhinitis.    [provider]  folic acid (FOLVITE) 1 MG tablet Take 1 mg by mouth at bedtime.    [provider]  gabapentin (NEURONTIN) 300 MG capsule TAKE 1 CAPSULE(300 MG) BY MOUTH DAILY Patient taking differently: Take 300 mg by mouth at bedtime. 01/07/22   [name], MD  HYDROcodone-acetaminophen (NORCO/VICODIN) 5-325 MG tablet Take 1 tablet by mouth every 6 (six) hours as needed for moderate pain. Take one tab po q 4 hrs prn pain 06/07/22   Thurnell Lose, MD  icosapent Ethyl (VASCEPA) 1 g capsule Take 2 g by mouth 2 (two) times daily. 04/29/22   [provider]  insulin aspart (NOVOLOG) 100 UNIT/ML FlexPen Before each meal 3 times a day, 140-199 - 2 units, 200-250 - 4 units, 251-299 - 6 units,  300-349 - 8 units,  350 or above 10 units. 06/07/22   Thurnell Lose, MD  insulin glargine-yfgn (SEMGLEE) 100 UNIT/ML injection Inject 0.1 mLs (10 Units total) into the skin at bedtime. 06/07/22   Thurnell Lose, MD  methocarbamol (ROBAXIN) 500 MG tablet Take 1 tablet (500 mg total) by mouth 3 (three) times daily. Patient not taking: Reported on 05/24/2022 05/16/22   Kem Parkinson, PA-C  pantoprazole (PROTONIX) 40 MG tablet Take 1 tablet (40 mg total) by mouth daily. 06/07/22   Thurnell Lose, MD  rifampin (RIFADIN) 300 MG capsule Take 1 capsule (300 mg total) by mouth 2 (two) times daily. 06/07/22 07/12/22  Thurnell Lose, MD  rosuvastatin (CRESTOR) 20 MG tablet Take 20 mg by mouth at bedtime. 02/24/15   [provider]  triamcinolone cream (KENALOG) 0.1 % Apply 1 Application topically 2 (two) times daily.    [provider]    Current Medications Scheduled Meds:  carvedilol  3.125 mg Oral BID WC   Chlorhexidine Gluconate Cloth  6 each Topical Daily   clopidogrel  75 mg Oral QHS   diclofenac Sodium  2 g Topical QID   gabapentin  300 mg Oral QHS   heparin   5,000 Units Subcutaneous Q8H   insulin aspart  0-5 Units Subcutaneous QHS   insulin aspart  0-9 Units Subcutaneous TID WC   insulin glargine-yfgn  10 Units Subcutaneous QHS   melatonin  3 mg Oral QHS   pantoprazole  40 mg Oral Daily   rifampin  300 mg Oral BID   Continuous Infusions:   ceFAZolin (ANCEF) IV     PRN Meds:.acetaminophen **OR** acetaminophen, albuterol, alteplase, heparin, HYDROcodone-acetaminophen, ondansetron **OR** ondansetron (ZOFRAN) IV, sodium chloride  CBC Recent Labs  Lab 06/24/22 1334 06/25/22 0659 06/27/22 0646  WBC 11.3* 12.3* 11.4*  NEUTROABS 8.9*  --   --   HGB 9.3* 9.1* 8.9*  HCT 30.5* 29.9* 29.6*  MCV 101.7* 102.4* 102.1*  PLT 168 195 0000000   Basic Metabolic Panel Recent Labs  Lab 06/24/22 1357 06/25/22 0500 06/27/22 0646  NA 131* 132* 129*  K 3.4* 3.5 4.2  CL 94* 95* 92*  CO2 27 25 24   GLUCOSE 119* 112* 157*  BUN 20 24* 26*  CREATININE 4.11* 4.74* 4.70*  CALCIUM 9.0 9.1 9.0  PHOS  --   --  4.7*    Physical Exam  Blood pressure (!) 115/56, pulse 72, temperature 98.6 F (37 C), resp. rate 12, height 4\' 3"  (1.295 m), weight 115.1 kg, SpO2 100 %. GEN: Chronically ill-appearing on BiPAP, groggy but responds to physical stimuli ENT: BiPAP in place, NCAT EYES: Eyes closed, CV: Regular, normal S1 and S2 PULM: Coarse breath sounds bilaterally, limited movement in the bases ABD: Soft, nontender SKIN: Right IJ TDC C/D/I EXT: Bilateral lower extremity amputations present  Assessment 2M ESRD MWF [facility]  with acute hypercapnic and hypoxic respiratory failure likely due to pulmonary edema/acute exacerbation of HFrEF.  ESRD MWF R IJ TDC [facility]  Acute hypoxic and hypercapnic respiratory failure Acute on HFrEF Staph lugdunensis bacteremia on cefazolin and rifampin per ID Anemia hemoglobin stable at 8.9 PVD with bilateral AKA CKD-BMD calcium and phosphorus at target  Plan HD today, goal 3 L, use TDC TTE ordered Will  follow  [name]  06/27/2022, 9:09 AM

## 2022-06-27 NOTE — Progress Notes (Signed)
Patient placed on BIPAP early this morning for shallow breathing and change in mental status by night shift RT. Night shift RT obtained ABG and CO2 was 78 so patient was placed on BIPAP. Due to recent ABG results and recent placement of BIPAP RT will leave BIPAP on patient a little while longer and will attempt to take off a little later this morning.

## 2022-06-27 NOTE — Progress Notes (Signed)
PROGRESS NOTE    [name].  AV:[id number] DOB: Jun 01, 1955 DOA: 06/24/2022 PCP: [name], MD    Brief Narrative:  [name]. is a 67 y.o. male with medical history significant of recent hospitalization with bacteremia,  ESRD on HD, DM.  Patient was brought in from [facility] with c/o SOB.  He had over 1/2 of his HD remaining as he only completed 1 of his 4 hours.  Patient denies chest pain, denies fevers, denies chills.  Patient reports he had fallen asleep and woke up coughing and was sent to the ER.  O2 sats were 95% on RA.   No fevers, no chills   In the ER, x ray showed volume overload, elevated BNP, and low K.  ER doc spoke with renal MD who suggested admission and to finish his HD session.  Appears that will not happen until 3/23.    I called the [facility] to figure out why they sent him here and was told by the RN that the NP listened to the patient and did not hear any "air movement" and was concerned for PNA so sent him to the ER without completing his HD.   Assessment and Plan:  Worsening respiratory failure- resp acidosis overnight -ABG done and placed on BIPAP -repeat ABG at 9am -HD and hopeful removal of enough fluid  Volume overload in an ESRD patient -sent from HD for SOB -only got 1 hour on Friday so volume overloaded at admission--  HD 3/23 with 2.3L removed -no fever so doubt infection -not abiding by fluid restriction here so doubt he follows at SNF as well so suspect this may be contributing to volume overload -CTA done with no PE, chf -will get limited echo to compare to prior TEE -renal consulted for continued HD   Recent hospitalization for Bacteremia with blood cx growing [place] with history of HD catheter and AICD device, TEE suspicious for aortic root abscess, AICD vegetation, tricuspid valve endocarditis -  He is on IV antibiotics per ID, underwent tunneled catheter removal by IR on 05/27/2022, new tunneled right IJ dialysis catheter  was placed on 05/30/2022 after line holidays.   Per ID - Cefazolin with HD 6 weeks plus rifampin 300 mg PO bid for 6 weeks (EOT 07/08/22) .  -repeat limited echo -pro calcitonin mildly elevated but still on abx  obesity due to excess calories with body mass index (BMI) of 33.0 to 33.9 in adult -Estimated body mass index is 72.06 kg/m as calculated from the following:   Height as of this encounter: 4\' 3"  (1.295 m).   Weight as of this encounter: 120.9 kg.    Kidney lesion, native, left -Incidentally noted on CT -Outpatient MRI recommended    ESRD (end stage renal disease) on dialysis ([place]) -Patient on chronic MWF HD -see above   Anemia -Associated with ESRD -monitor   DM type 2 with peripheral vascular complications (HCC) -123456 5.6, good control -SSI   Hypokalemia -fix in HD   [facility] consult for return to [facility] when medically ready  DVT prophylaxis: heparin injection 5,000 Units Start: 06/24/22 1800    Code Status: Full Code   Disposition Plan:  Level of care: Stepdown      Consultants:  renal   Subjective: On BIPAP  Objective: Vitals:   06/27/22 0606 06/27/22 0700 06/27/22 0722 06/27/22 0728  BP: (!) 155/60 (!) 115/56    Pulse: 78 73  72  Resp: (!) 22 12  12  Temp: (!) 93.6 F (34.2 C)  98.6 F (37 C)   TempSrc: Rectal     SpO2:  100%  100%  Weight:      Height:        Intake/Output Summary (Last 24 hours) at 06/27/2022 0800 Last data filed at 06/26/2022 [id number] Gross per 24 hour  Intake 480 ml  Output --  Net 480 ml   Filed Weights   06/24/22 1740 06/25/22 1010 06/25/22 1330  Weight: 117 kg 117.4 kg 115.1 kg    Examination:   General: Appearance:    Severely obese male in no acute distress- appears comfortable on BIPAP     Lungs:     respirations unlabored  Heart:    Normal heart rate.   MS:   Amputations noted  Neurologic:   Resting on BIPAP         Data Reviewed: I have personally reviewed following labs and imaging  studies  CBC: Recent Labs  Lab 06/24/22 1334 06/25/22 0659 06/27/22 0646  WBC 11.3* 12.3* 11.4*  NEUTROABS 8.9*  --   --   HGB 9.3* 9.1* 8.9*  HCT 30.5* 29.9* 29.6*  MCV 101.7* 102.4* 102.1*  PLT 168 195 0000000   Basic Metabolic Panel: Recent Labs  Lab 06/24/22 1357 06/25/22 0500 06/27/22 0646  NA 131* 132* 129*  K 3.4* 3.5 4.2  CL 94* 95* 92*  CO2 27 25 24   GLUCOSE 119* 112* 157*  BUN 20 24* 26*  CREATININE 4.11* 4.74* 4.70*  CALCIUM 9.0 9.1 9.0  MG  --   --  2.3  PHOS  --   --  4.7*   GFR: Estimated Creatinine Clearance: 13.3 mL/min (A) (by C-G formula based on SCr of 4.7 mg/dL (H)). Liver Function Tests: Recent Labs  Lab 06/24/22 1357 06/27/22 0646  AST 16 14*  ALT <5 <5  ALKPHOS 87 84  BILITOT 0.7 1.0  PROT 6.2* 6.3*  ALBUMIN 2.5* 2.6*   No results for input(s): "LIPASE", "AMYLASE" in the last 168 hours. No results for input(s): "AMMONIA" in the last 168 hours. Coagulation Profile: No results for input(s): "INR", "PROTIME" in the last 168 hours. Cardiac Enzymes: No results for input(s): "CKTOTAL", "CKMB", "CKMBINDEX", "TROPONINI" in the last 168 hours. BNP (last 3 results) No results for input(s): "PROBNP" in the last 8760 hours. HbA1C: No results for input(s): "HGBA1C" in the last 72 hours. CBG: Recent Labs  Lab 06/26/22 1125 06/26/22 1619 06/26/22 1956 06/27/22 0528 06/27/22 0721  GLUCAP 136* 153* 158* 149* 151*   Lipid Profile: No results for input(s): "CHOL", "HDL", "LDLCALC", "TRIG", "CHOLHDL", "LDLDIRECT" in the last 72 hours. Thyroid Function Tests: No results for input(s): "TSH", "T4TOTAL", "FREET4", "T3FREE", "THYROIDAB" in the last 72 hours. Anemia Panel: No results for input(s): "VITAMINB12", "FOLATE", "FERRITIN", "TIBC", "IRON", "RETICCTPCT" in the last 72 hours. Sepsis Labs: Recent Labs  Lab 06/27/22 0646  PROCALCITON 0.86  LATICACIDVEN 1.5    Recent Results (from the past 240 hour(s))  Resp panel by RT-PCR (RSV, Flu A&B,  Covid) Anterior Nasal Swab     Status: None   Collection Time: 06/24/22  1:35 PM   Specimen: Anterior Nasal Swab  Result Value Ref Range Status   SARS Coronavirus 2 by RT PCR NEGATIVE NEGATIVE Final    Comment: (NOTE) SARS-CoV-2 target nucleic acids are NOT DETECTED.  The SARS-CoV-2 RNA is generally detectable in upper respiratory specimens during the acute phase of infection. The lowest concentration of SARS-CoV-2 viral copies this  assay can detect is 138 copies/mL. A negative result does not preclude SARS-Cov-2 infection and should not be used as the sole basis for treatment or other patient management decisions. A negative result may occur with  improper specimen collection/handling, submission of specimen other than nasopharyngeal swab, presence of viral mutation(s) within the areas targeted by this assay, and inadequate number of viral copies(<138 copies/mL). A negative result must be combined with clinical observations, patient history, and epidemiological information. The expected result is Negative.  Fact Sheet for Patients:  EntrepreneurPulse.com.au  Fact Sheet for Healthcare Providers:  IncredibleEmployment.be  This test is no t yet approved or cleared by the [place] FDA and  has been authorized for detection and/or diagnosis of SARS-CoV-2 by FDA under an Emergency Use Authorization (EUA). This EUA will remain  in effect (meaning this test can be used) for the duration of the COVID-19 declaration under Section 564(b)(1) of the Act, 21 U.S.C.section 360bbb-3(b)(1), unless the authorization is terminated  or revoked sooner.       Influenza A by PCR NEGATIVE NEGATIVE Final   Influenza B by PCR NEGATIVE NEGATIVE Final    Comment: (NOTE) The Xpert Xpress SARS-CoV-2/FLU/RSV plus assay is intended as an aid in the diagnosis of influenza from Nasopharyngeal swab specimens and should not be used as a sole basis for treatment. Nasal  washings and aspirates are unacceptable for Xpert Xpress SARS-CoV-2/FLU/RSV testing.  Fact Sheet for Patients: EntrepreneurPulse.com.au  Fact Sheet for Healthcare Providers: IncredibleEmployment.be  This test is not yet approved or cleared by the [place] FDA and has been authorized for detection and/or diagnosis of SARS-CoV-2 by FDA under an Emergency Use Authorization (EUA). This EUA will remain in effect (meaning this test can be used) for the duration of the COVID-19 declaration under Section 564(b)(1) of the Act, 21 U.S.C. section 360bbb-3(b)(1), unless the authorization is terminated or revoked.     Resp Syncytial Virus by PCR NEGATIVE NEGATIVE Final    Comment: (NOTE) Fact Sheet for Patients: EntrepreneurPulse.com.au  Fact Sheet for Healthcare Providers: IncredibleEmployment.be  This test is not yet approved or cleared by the [place] FDA and has been authorized for detection and/or diagnosis of SARS-CoV-2 by FDA under an Emergency Use Authorization (EUA). This EUA will remain in effect (meaning this test can be used) for the duration of the COVID-19 declaration under Section 564(b)(1) of the Act, 21 U.S.C. section 360bbb-3(b)(1), unless the authorization is terminated or revoked.  Performed at [facility], [address] [place], [place] [address]   MRSA Next Gen by PCR, Nasal     Status: None   Collection Time: 06/25/22  5:09 AM   Specimen: Nasal Mucosa; Nasal Swab  Result Value Ref Range Status   MRSA by PCR Next Gen NOT DETECTED NOT DETECTED Final    Comment: (NOTE) The GeneXpert MRSA Assay (FDA approved for NASAL specimens only), is one component of a comprehensive MRSA colonization surveillance program. It is not intended to diagnose MRSA infection nor to guide or monitor treatment for MRSA infections. Test performance is not FDA approved in patients less than 31  years old. Performed at [facility], [address] [place], [place] [address]          Radiology Studies: CT Angio Chest Pulmonary Embolism (PE) W or WO Contrast  Result Date: 06/27/2022 CLINICAL DATA:  Shortness of breath EXAM: CT ANGIOGRAPHY CHEST WITH CONTRAST TECHNIQUE: Multidetector CT imaging of the chest was performed using the standard protocol during bolus administration of intravenous  contrast. Multiplanar CT image reconstructions and MIPs were obtained to evaluate the vascular anatomy. RADIATION DOSE REDUCTION: This exam was performed according to the departmental dose-optimization program which includes automated exposure control, adjustment of the mA and/or kV according to patient size and/or use of iterative reconstruction technique. CONTRAST:  98mL OMNIPAQUE IOHEXOL 350 MG/ML SOLN 13 hour premedication prep was administered. COMPARISON:  Chest x-ray from 06/25/2022 FINDINGS: Cardiovascular: Atherosclerotic calcifications of the thoracic aorta are noted. No aneurysmal dilatation or dissection is seen. Heart is mildly enlarged in size. Pacing device is again seen. The pulmonary artery shows a normal branching pattern bilaterally. No filling defect to suggest pulmonary embolism is noted. Mediastinum/Nodes: Thoracic inlet is within normal limits. No hilar or mediastinal adenopathy is noted. The esophagus as visualized is within normal limits. Lungs/Pleura: Bilateral moderate pleural effusions are noted right slightly greater than left. Associated lower lobe consolidation is noted again right greater than left. Mild central vascular congestion is seen. Patchy airspace opacities are noted likely representing edema although inflammatory change cannot be totally excluded. No parenchymal nodules are noted. Upper Abdomen: Visualized upper abdomen is within normal limits. Musculoskeletal: No chest wall abnormality. No acute or significant osseous findings. Review of the MIP images confirms the  above findings. IMPRESSION: No evidence of pulmonary emboli. Changes most suggestive of CHF with edema and bilateral effusions. Aortic Atherosclerosis (ICD10-I70.0). Electronically Signed   By: Inez Catalina M.D.   On: 06/27/2022 00:37   DG CHEST PORT 1 VIEW  Result Date: 06/25/2022 CLINICAL DATA:  Volume overload EXAM: PORTABLE CHEST 1 VIEW COMPARISON:  06/24/2022 FINDINGS: Single frontal view of the chest demonstrates stable right internal jugular dialysis catheter and single lead AICD. Cardiac silhouette is markedly enlarged, but stable. There is persistent vascular congestion, with bibasilar veiling opacities again noted right greater than left. No pneumothorax. No acute bony abnormalities. IMPRESSION: 1. Stable findings of congestive heart failure. Electronically Signed   By: Randa Ngo M.D.   On: 06/25/2022 15:54        Scheduled Meds:  carvedilol  3.125 mg Oral BID WC   Chlorhexidine Gluconate Cloth  6 each Topical Daily   clopidogrel  75 mg Oral QHS   diclofenac Sodium  2 g Topical QID   gabapentin  300 mg Oral QHS   heparin  5,000 Units Subcutaneous Q8H   insulin aspart  0-5 Units Subcutaneous QHS   insulin aspart  0-9 Units Subcutaneous TID WC   insulin glargine-yfgn  10 Units Subcutaneous QHS   melatonin  3 mg Oral QHS   pantoprazole  40 mg Oral Daily   rifampin  300 mg Oral BID   Continuous Infusions:   ceFAZolin (ANCEF) IV       LOS: 1 day    Time spent: 45 minutes spent on chart review, discussion with nursing staff, consultants, updating family and interview/physical exam; more than 50% of that time was spent in counseling and/or coordination of care.    [name], DO [facility] Available via Epic secure chat 7am-7pm After these hours, please refer to coverage provider listed on [email or web address] 06/27/2022, 8:00 AM

## 2022-06-27 NOTE — Discharge Instructions (Signed)
Food Resources:  Agency Name: [facility] Address: [address], [place], [place] [address] Phone: [phone] Website: www.adtsrc.org Services Offered: Meals on [employer] and Meals with Friends.  Home care, at home assisted living, [place]  for [place], transportation  Agency Name: [place] Address: Sites vary. Must call first. Food Pantry location: [address] [place], [place] [address] [employer] Phone: [phone] Services Offered: Food assistance, utility assistance if funds available Scientist, forensic for all of [place], [place], [place] for [facility] area only) Walk-in  current Id and current address verification required.  Wed-Thurs: 9:30-12:00  Agency Name: [facility] Address: [address] [place], [place] [address]  Phone: [phone] or [phone] Website: none Services Offered: Food assistance Agency Name: Sandrea Matte Address: [address], [place], [place] [address]  Phone: [phone] or [phone]  Website: none Services Offered: Serves 1 hot meal a day at 11:00 am Monday-Sunday and 5 pm  on the second and fourth Sunday of each month  Agency Name: [place] [facility]  Services/Social Services  Address: [place], [place], [place] [address] Phone: [phone] Website: [email or web address]  [email or web address] Services Offered: Physicist, medical, [facility] program  Agency Name: [facility] Address: [address] [place],  [address] Phone: [phone] Website: [email or web address] Services Offered: Food pantry Tuesday, Wednesday and Thursday 9am-11:30am  (need appointment) and health clinic (9:00am-11:00am)  Agency Name: [employer] [facility] Address: [address], [place] / [address] [place] Phone: [phone] [place] / [phone] [place] Website: [email or web address] Services Offered: Games developer, food pantry, soup kitchen ([employer]) emergency financial  assistance, thrift stores, showers & hygiene products ([place]),  [place], spiritual help

## 2022-06-27 NOTE — Progress Notes (Signed)
  HEMODIALYSIS TREATMENT NOTE:  Restless and non-compliant with BiPAP pre-dialysis.  Required Haldol and safety mitts during HD session for safety.  Albumin 25g given x1 for SBP<100.  3.5 hour session completed with 2.9 liters removed.  All blood was returned.  Pharmacist was notified of Cefazolin administration earlier today, prior to HD.  No need for additional dose, per Chase Picket, RPh.  Post dialysis:  06/27/22 1830  Vital Signs  Temp  (refused)  Pulse Rate 79  Pulse Rate Source Monitor  Resp 20  BP (!) 133/48  BP Location Right Arm  BP Method Automatic  Patient Position (if appropriate) Sitting  Oxygen Therapy  SpO2 100 %  O2 Device Room Air  Dialysis Weight  Weight 112.9 kg  Type of Weight Post-Dialysis   [place], RN AP KDU

## 2022-06-27 NOTE — Consult Note (Addendum)
NAME:  [name]., MRN:  [id number], DOB:  10-24-1955, LOS: 1 ADMISSION DATE:  06/24/2022, CONSULTATION DATE:  06/27/22 REFERRING MD:  Eliseo Squires, [facility], CHIEF COMPLAINT:  acute resp distress    History of Present Illness:  66 yobm remote smoker HD pt with endocarditis/ Aortic insufficiency admtted p [facility] to complete HD 3/22 with evidence of vol overloadd / bilateral pl effusions on admit > bipap dependent resp failure and PCCM service requested eval am 3/25.   Pertinent  Medical History   65 yobm with medical history significant of recent hospitalization with bacteremia,  ESRD on HD, DM.  Patient was brought in from [place] [facility] with c/o SOB.  He had over 1/2 of his HD remaining as he only completed 1 of his 4 hours on 3/22.  Patient denied chest pain, denies fevers, denies chills.  Patient reported he had fallen asleep and woke up coughing and was sent to the ER.  O2 sats were 95% on RA.       In the ER, x ray showed volume overload, elevated BNP, and low K.  ER doc spoke with renal MD who suggested admission and to finish his HD session.    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 Perforation of left coronary cusp aortic valve leaflet. The aortic  valve is tricuspid. Aortic valve regurgitation is moderate to severe. Mild  aortic stenosis.   3. Large vegetation measuring 1.5 cm x 1.5 cm appears attached to both  ICD lead and septal lealfet of tricuspid valve on atrial side. Mild  tricuspid regurgitation. There is another vegetation measuring 1.3 cm x  0.4 cm attached to ICD lead in right  atrium.   4. Left ventricular ejection fraction, by estimation, is 45 to 50%. The  left ventricle has mildly decreased function.   5. Right ventricular systolic function is normal. The right ventricular  size is mildly enlarged   6. The mitral valve is degenerative. Trivial mitral valve regurgitation.   7. No left atrial/left atrial  appendage thrombus was detected.     Significant Hospital Events: Including procedures, antibiotic start and stop dates in addition to other pertinent events   Viral Panel 06/24/22 neg  Rocephin/ Rifampin pre admit and continued 3/22>>> MRSA PCR  3/23  neg  CTa 3/24 c/w chf with edema and R > L effusions  Repeat echo 3/25 >>>  Interim History / Subjective:  Better mentation/ wob per Nursing since place on bipap   Objective   Blood pressure (!) 126/48, pulse 79, temperature (!) 97 F (36.1 C), resp. rate (!) 21, height 4\' 3"  (1.295 m), weight 115.1 kg, SpO2 100 %.    FiO2 (%):  [35 %] 35 %   Intake/Output Summary (Last 24 hours) at 06/27/2022 1327 Last data filed at 06/26/2022 1852 Gross per 24 hour  Intake 240 ml  Output --  Net 240 ml   Filed Weights   06/24/22 1740 06/25/22 1010 06/25/22 1330  Weight: 117 kg 117.4 kg 115.1 kg    Examination: Tmax:  99 General appearance:    obese bm asleep on bipap with VT 450-500 and RR 28   At Rest 02 sats  100% on 0.35   No jvd Oropharynx mask in place  Neck supple Lungs with min distant  exp > insp rhonchi bilaterally RRR no s3 or or with 2/6 diastolic murmur Abd obese with poor insp excursion  Extr  warm with no edema or clubbing noted Neuro  Sensorium asleep/ intermittently agitated per nursing ,  no apparent motor deficits      Assessment & Plan:  1) acute respiratory failure hypoxemic and hypercarbic likely related to vol overload/ chf with AI complicated by bilateral pleural effusions .   Copd less likely  s/p remote smoking cessation s apparently sequelae previously, no pfts on file  >> improved on HD but a long way to go and possible he will need R Thoracentesis if can't mobilize fluid from 3rd space more rapidly over next 24-48 h  so continue to pull as dry as tolerates   NB Note that pleural effusion and copd have the same effect on insp muscles/mechanics (both shorten their length prior to inspiration making them weaker  with less force reserve) so they are synergistic in causing sob.   2) BIPAP dep secondary to #1 and likely OHS component >>> agree with bipap prn, up in chair as soon as feasible and wean bipap as tol per Mental status and wob, not per ABGs per se   Best Practice (right click and "Reselect all SmartList Selections" daily)     Labs   CBC: Recent Labs  Lab 06/24/22 1334 06/25/22 0659 06/27/22 0646  WBC 11.3* 12.3* 11.4*  NEUTROABS 8.9*  --   --   HGB 9.3* 9.1* 8.9*  HCT 30.5* 29.9* 29.6*  MCV 101.7* 102.4* 102.1*  PLT 168 195 0000000    Basic Metabolic Panel: Recent Labs  Lab 06/24/22 1357 06/25/22 0500 06/27/22 0646  NA 131* 132* 129*  K 3.4* 3.5 4.2  CL 94* 95* 92*  CO2 27 25 24   GLUCOSE 119* 112* 157*  BUN 20 24* 26*  CREATININE 4.11* 4.74* 4.70*  CALCIUM 9.0 9.1 9.0  MG  --   --  2.3  PHOS  --   --  4.7*   GFR: Estimated Creatinine Clearance: 13.3 mL/min (A) (by C-G formula based on SCr of 4.7 mg/dL (H)). Recent Labs  Lab 06/24/22 1334 06/25/22 0659 06/27/22 0646 06/27/22 1002  PROCALCITON  --   --  0.86  --   WBC 11.3* 12.3* 11.4*  --   LATICACIDVEN  --   --  1.5 1.4    Liver Function Tests: Recent Labs  Lab 06/24/22 1357 06/27/22 0646  AST 16 14*  ALT <5 <5  ALKPHOS 87 84  BILITOT 0.7 1.0  PROT 6.2* 6.3*  ALBUMIN 2.5* 2.6*   No results for input(s): "LIPASE", "AMYLASE" in the last 168 hours. No results for input(s): "AMMONIA" in the last 168 hours.  ABG    Component Value Date/Time   PHART 7.21 (L) 06/27/2022 0911   PCO2ART 76 (HH) 06/27/2022 0911   PO2ART <31 (LL) 06/27/2022 0911   HCO3 30.4 (H) 06/27/2022 0911   TCO2 26 02/08/2022 0857   ACIDBASEDEF 1.4 06/27/2022 0536   O2SAT 33.7 06/27/2022 0911     Coagulation Profile: No results for input(s): "INR", "PROTIME" in the last 168 hours.  Cardiac Enzymes: No results for input(s): "CKTOTAL", "CKMB", "CKMBINDEX", "TROPONINI" in the last 168 hours.  HbA1C: Hgb A1c MFr Bld  Date/Time  Value Ref Range Status  05/25/2022 05:17 AM 5.6 4.8 - 5.6 % Final    Comment:    (NOTE) Pre diabetes:          5.7%-6.4%  Diabetes:              >6.4%  Glycemic control for   <7.0% adults  with diabetes   02/18/2021 03:22 PM 6.2 (H) 4.8 - 5.6 % Final    Comment:    (NOTE) Pre diabetes:          5.7%-6.4%  Diabetes:              >6.4%  Glycemic control for   <7.0% adults with diabetes     CBG: Recent Labs  Lab 06/26/22 1619 06/26/22 1956 06/27/22 0528 06/27/22 0721 06/27/22 1120  GLUCAP 153* 158* 149* 151* 125*       Past Medical History:  He,  has a past medical history of AICD (automatic cardioverter/defibrillator) present, Anemia, Arthritis, Cerebrovascular disease, CHF (congestive heart failure) ([place]), Chronic kidney disease, COVID (2022), Diabetes mellitus, History of TIAs, Hyperlipidemia, Hypertension, Nonischemic cardiomyopathy ([place]), PVD (peripheral vascular disease) ([place]), Shortness of breath dyspnea, Skin disease, Stroke ([place]), and Tobacco abuse.   Surgical History:   Past Surgical History:  Procedure Laterality Date   A/V FISTULAGRAM Left 06/24/2016   Procedure: A/V Fistulagram;  Surgeon: Elam Dutch, MD;  Location: [facility];  Service: Cardiovascular;  Laterality: Left;   A/V FISTULAGRAM N/A 01/17/2017   Procedure: A/V Fistulagram - left arm;  Surgeon: Serafina Mitchell, MD;  Location: [facility];  Service: Cardiovascular;  Laterality: N/A;   AORTIC ARCH ANGIOGRAPHY N/A 10/21/2021   Procedure: AORTIC ARCH ANGIOGRAPHY;  Surgeon: Marty Heck, MD;  Location: [facility];  Service: Cardiovascular;  Laterality: N/A;   AV FISTULA PLACEMENT Left 04/08/2015   Procedure: Creation of Left arm BRACHIOCEPHALIC ARTERIOVENOUS  FISTULA ;  Surgeon: Mal Misty, MD;  Location: [facility];  Service: Vascular;  Laterality: Left;   AV FISTULA PLACEMENT Left 10/27/2020   Procedure: LEFT ARM ARTERIOVENOUS (AV) FISTULA CREATION;  Surgeon: Rosetta Posner, MD;  Location: [facility];  Service: Vascular;  Laterality: Left;   AV FISTULA PLACEMENT Left 06/29/2021   Procedure: INSERTION OF LEFT ARM ARTERIOVENOUS (AV) GORE-TEX GRAFT;  Surgeon: Rosetta Posner, MD;  Location: [facility];  Service: Vascular;  Laterality: Left;   [place] Left 01/12/2021   Procedure: LEFT ARM SECOND STAGE BASILIC VEIN TRANSPOSITION;  Surgeon: Rosetta Posner, MD;  Location: [facility];  Service: Vascular;  Laterality: Left;   CARDIAC DEFIBRILLATOR [place] EXTRACTION W/PHACO Right 03/17/2015   Procedure: CATARACT EXTRACTION PHACO AND INTRAOCULAR LENS PLACEMENT ([place]);  Surgeon: Rutherford Guys, MD;  Location: [facility];  Service: Ophthalmology;  Laterality: Right;  CDE: 6.59   EXCHANGE OF A DIALYSIS CATHETER Right 02/08/2022   Procedure: EXCHANGE OF A TUNNELED DIALYSIS CATHETER;  Surgeon: Rosetta Posner, MD;  Location: [facility];  Service: Vascular;  Laterality: Right;   EYE SURGERY Right    Cataract   IR FLUORO GUIDE CV LINE RIGHT  05/26/2021   IR FLUORO GUIDE CV LINE RIGHT  05/30/2022   IR PTA VENOUS EXCEPT DIALYSIS CIRCUIT  05/26/2021   IR US GUIDE VASC ACCESS RIGHT  05/30/2022   LEG AMPUTATION BELOW KNEE     bilateral   LIGATION ARTERIOVENOUS GORTEX GRAFT Left 09/14/2021   Procedure: LIGATION OF LEFT ARM ARTERIOVENOUS GORTEX GRAFT;  Surgeon: Broadus John, MD;  Location: [facility];  Service: Vascular;  Laterality: Left;  PERIPHERAL NERVE BLOCK   PERIPHERAL VASCULAR INTERVENTION  01/17/2017   Procedure: PERIPHERAL VASCULAR INTERVENTION;  Surgeon: Serafina Mitchell, MD;  Location: [facility];  Service: Cardiovascular;;  PTA  left arm fistula   TEE WITHOUT CARDIOVERSION N/A 05/31/2022   Procedure: TRANSESOPHAGEAL ECHOCARDIOGRAM (TEE);  Surgeon: Donato Heinz, MD;  Location: [facility];  Service: Cardiovascular;  Laterality: N/A;   UPPER EXTREMITY ANGIOGRAPHY N/A 10/21/2021   Procedure: Upper Extremity  Angiography;  Surgeon: Marty Heck, MD;  Location: [facility];  Service: Cardiovascular;  Laterality: N/A;   WOUND DEBRIDEMENT Left 02/18/2021   Procedure: LEFT ARM DEBRIDEMENT;  Surgeon: Serafina Mitchell, MD;  Location[facility];  Service: Vascular;  Laterality: Left;     Social History:   reports that he quit smoking about 24 years ago. His smoking use included cigarettes. He has a 20.00 pack-year smoking history. He has never been exposed to tobacco smoke. He has never used smokeless tobacco. He reports that he does not currently use alcohol. He reports that he does not use drugs.   Family History:  His family history includes Coronary artery disease in an other family member; Diabetes in his brother, brother, brother, brother, father, mother, and another family member; Heart failure in his mother; Stroke in his father.   Allergies Allergies  Allergen Reactions   Contrast Media [Iodinated Contrast Media] Hives and Other (See Comments)    Happened "in the 80's"   Other Other (See Comments)    "Transfer Dye" = "Makes me tired"   Acetazolamide Anxiety and Other (See Comments)    Jittery, odd feeling (hyper feeling)     Home Medications  Prior to Admission medications   Medication Sig Start Date End Date Taking? Authorizing Provider  aspirin 81 MG tablet Take 81 mg by mouth at bedtime.    [provider]  brimonidine (ALPHAGAN) 0.2 % ophthalmic solution Place 1 drop into the right eye 3 (three) times daily.    [provider]  calcium acetate (PHOSLO) 667 MG capsule Take 667-1,334 mg by mouth See admin instructions. Take 1,334 mg by mouth three times a day with meals and 667 mg with each snack 03/25/15   [provider]  carvedilol (COREG) 3.125 MG tablet Take 1 tablet (3.125 mg total) by mouth 2 (two) times daily with a meal. 06/07/22   Thurnell Lose, MD  ceFAZolin (ANCEF) IVPB Inject 2 g into the vein every Monday, Wednesday, and Friday with  hemodialysis. 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 06/07/22 07/12/22  Thurnell Lose, MD  clopidogrel (PLAVIX) 75 MG tablet Take 75 mg by mouth at bedtime.    [provider]  cyanocobalamin 1000 MCG tablet Take 1 tablet (1,000 mcg total) by mouth daily. 06/07/22   Thurnell Lose, MD  docusate sodium (COLACE) 100 MG capsule Take 2 capsules (200 mg total) by mouth 2 (two) times daily. 06/07/22   Thurnell Lose, MD  ferrous sulfate 325 (65 FE) MG tablet Take 1 tablet (325 mg total) by mouth 2 (two) times daily with a meal. 06/07/22   Thurnell Lose, MD  fluticasone (FLONASE) 50 MCG/ACT nasal spray Place 1 spray into both nostrils at bedtime as needed for allergies or rhinitis.    [provider]  folic acid (FOLVITE) 1 MG tablet Take 1 mg by mouth at bedtime.    [provider]  gabapentin (NEURONTIN) 300 MG capsule TAKE 1 CAPSULE(300 MG) BY MOUTH DAILY Patient taking differently: Take 300 mg by mouth at bedtime. 01/07/22   [name], MD  HYDROcodone-acetaminophen (NORCO/VICODIN) 5-325 MG tablet Take 1 tablet by mouth every 6 (six) hours as needed for moderate pain. Take one tab po q 4 hrs prn pain 06/07/22   Thurnell Lose, MD  icosapent Ethyl (VASCEPA) 1 g capsule Take 2 g by mouth 2 (two) times daily. 04/29/22   [provider]  insulin aspart (NOVOLOG) 100 UNIT/ML FlexPen Before each meal 3 times a day, 140-199 - 2 units, 200-250 - 4 units, 251-299 - 6 units,  300-349 - 8 units,  350 or above 10 units. 06/07/22   Thurnell Lose, MD  insulin  glargine-yfgn (SEMGLEE) 100 UNIT/ML injection Inject 0.1 mLs (10 Units total) into the skin at bedtime. 06/07/22   Thurnell Lose, MD  methocarbamol (ROBAXIN) 500 MG tablet Take 1 tablet (500 mg total) by mouth 3 (three) times daily. Patient not taking: Reported on 05/24/2022 05/16/22   Kem Parkinson, PA-C  pantoprazole (PROTONIX) 40 MG tablet Take 1 tablet (40 mg total) by mouth daily. 06/07/22   Thurnell Lose, MD  rifampin (RIFADIN) 300 MG capsule Take 1 capsule (300 mg total) by mouth 2 (two) times daily. 06/07/22 07/12/22  Thurnell Lose, MD  rosuvastatin (CRESTOR) 20 MG tablet Take 20 mg by mouth at bedtime. 02/24/15   [provider]  triamcinolone cream (KENALOG) 0.1 % Apply 1 Application topically 2 (two) times daily.    [provider]    The patient is critically ill with multiple organ systems failure and requires high complexity decision making for assessment and support, frequent evaluation and titration of therapies, application of advanced monitoring technologies and extensive interpretation of multiple databases. Critical Care Time devoted to patient care services described in this note is 40 minutes.   Christinia Gully, MD Pulmonary and [place] [phone]   After 7:00 pm call Elink  [phone]

## 2022-06-27 NOTE — Progress Notes (Signed)
ABG obtained and delivered to the lab. Patient difficult stick and had been stuck multiple time by 2 RT's.Blood returned slow and could possibly be venous.

## 2022-06-27 NOTE — Progress Notes (Signed)
Progress note  RN called to come to assess patient at bedside due to concern regarding change in mental status.  At bedside, patient was somnolent, he was arousable, but quickly goes back to sleep, lips appear slightly cyanotic, though O2 sats was showing 92% on supplemental oxygen via [place].  ABG was ordered and showed respiratory acidosis with hypoxia.  Patient was transferred to stepdown unit, BiPAP was ordered, CBC, CMP, lactic acid, procalcitonin, magnesium and phosphorus were ordered.  Patient was noted to have a temperature of 93.35F with no known cause.  ABG    Component Value Date/Time   PHART 7.18 (LL) 06/27/2022 0536   PCO2ART 78 (HH) 06/27/2022 0536   PO2ART 61 (L) 06/27/2022 0536   HCO3 29.1 (H) 06/27/2022 0536   TCO2 26 02/08/2022 0857   ACIDBASEDEF 1.4 06/27/2022 0536   O2SAT 90.8 06/27/2022 0536      Latest Ref Rng & Units 06/27/2022    6:46 AM 06/25/2022    6:59 AM 06/24/2022    1:34 PM  CBC  WBC 4.0 - 10.5 K/uL 11.4  12.3  11.3   Hemoglobin 13.0 - 17.0 g/dL 8.9  9.1  9.3   Hematocrit 39.0 - 52.0 % 29.6  29.9  30.5   Platelets 150 - 400 K/uL 175  195  168    CT angiography of chest with contrast done showed No evidence of pulmonary emboli.  Changes most suggestive of CHF with edema and bilateral effusions   Physical Exam  BP (!) 155/60   Pulse 78   Temp (!) 93.6 F (34.2 C) (Rectal)   Resp (!) 22   Ht 4\' 3"  (1.295 m)   Wt 115.1 kg   SpO2 92%   BMI 68.59 kg/m    Gen:-Somnolent, though easily arousable HEENT:- [place].AT, No sclera icterus Neck-Supple Neck,No JVD,.  Lungs-tachypnea.  Bilateral Rales in lower lobes CV- S1, S2 normal Abd-  +ve B.Sounds, Abd Soft, No tenderness,    Extremity/Skin:-Skin warm and dry    Psych-this cannot be obtained at this time due to patient's current condition Neuro-no new focal deficits, no tremors  Assessment and plan Acute respiratory failure with hypoxia in the setting of acute on chronic systolic CHF Respiratory acidosis  in setting of above CT angiography of chest was  suggestive of CHF with edema and bilateral effusions Patient was transferred to stepdown unit and was placed on BiPAP Consult was placed for nephrology for possible hemodialysis today Recheck ABG later in the morning  Hypothermia due to unknown cause at this time Warm blanket was placed CBC showed WBC of 11.46 acid was normal, BMP, procalcitonin, TSH still pending   Critical time: 35 minutes   Critical care personally provided  managing the patient due to high probability of clinically significant and life threatening deterioration. This critical care time included obtaining a history; examining the patient, pulse oximetry; ordering and review of studies; arranging urgent treatment with development of a management plan; evaluation of patient's response of treatment; frequent reassessment; and discussions with other providers.  This critical care time was performed to assess and manage the high probability of imminent and life threatening deterioration that could result in multi-organ failure.

## 2022-06-27 NOTE — Progress Notes (Signed)
Pt off BIPAP machine RT asked patient if he want to go back on machine patient states "not tonight" RT will continue to monitor. Patient is on 2lpm cann spo2 100%

## 2022-06-27 NOTE — Progress Notes (Signed)
*   Echocardiogram 2D Echocardiogram has been performed.  Wynelle Link 06/27/2022, 9:32 AM

## 2022-06-27 NOTE — Progress Notes (Signed)
Patient demonstrated increased shallow breathing, agitation, and decrease in mental status as shift progressed.  In the beginning of shift, patient was uncomfortable in bed, but was very conversant with staff and able to state needs with  no hesitation.  Patient had c/o not being able to breathe and only comfortable sitting straight up in bed.  Patient did have a few hours rest after CT (results were shared with MD on call).  Patient woke up around 0300, and patient was alert but seemed to be slower to respond and had difficulty staying awake and having a conversation as he was able to do earlier in shift.  Notified MD on call and asked to come to assess patient.  MD came to floor and assessed patient and received order for ABG.  Respiratory and AC notified of patient status.  ABG results received and MD notified.  Received order for bipap and transfer to stepdown.

## 2022-06-28 ENCOUNTER — Inpatient Hospital Stay (HOSPITAL_COMMUNITY): Payer: Medicare Other

## 2022-06-28 DIAGNOSIS — J9602 Acute respiratory failure with hypercapnia: Secondary | ICD-10-CM

## 2022-06-28 DIAGNOSIS — J9601 Acute respiratory failure with hypoxia: Secondary | ICD-10-CM | POA: Diagnosis not present

## 2022-06-28 DIAGNOSIS — I5020 Unspecified systolic (congestive) heart failure: Secondary | ICD-10-CM

## 2022-06-28 DIAGNOSIS — N186 End stage renal disease: Secondary | ICD-10-CM

## 2022-06-28 DIAGNOSIS — Z7189 Other specified counseling: Secondary | ICD-10-CM

## 2022-06-28 DIAGNOSIS — J810 Acute pulmonary edema: Secondary | ICD-10-CM | POA: Diagnosis not present

## 2022-06-28 LAB — GLUCOSE, CAPILLARY
Glucose-Capillary: 97 mg/dL (ref 70–99)
Glucose-Capillary: 117 mg/dL — ABNORMAL HIGH (ref 70–99)
Glucose-Capillary: 117 mg/dL — ABNORMAL HIGH (ref 70–99)
Glucose-Capillary: 127 mg/dL — ABNORMAL HIGH (ref 70–99)

## 2022-06-28 LAB — CBC
HCT: 25.9 % — ABNORMAL LOW (ref 39.0–52.0)
Hemoglobin: 7.9 g/dL — ABNORMAL LOW (ref 13.0–17.0)
MCH: 31.2 pg (ref 26.0–34.0)
MCHC: 30.5 g/dL (ref 30.0–36.0)
MCV: 102.4 fL — ABNORMAL HIGH (ref 80.0–100.0)
Platelets: 143 10*3/uL — ABNORMAL LOW (ref 150–400)
RBC: 2.53 MIL/uL — ABNORMAL LOW (ref 4.22–5.81)
RDW: 17.4 % — ABNORMAL HIGH (ref 11.5–15.5)
WBC: 9.7 10*3/uL (ref 4.0–10.5)
nRBC: 0 % (ref 0.0–0.2)

## 2022-06-28 LAB — POCT I-STAT 7, (LYTES, BLD GAS, ICA,H+H)
Acid-Base Excess: 0 mmol/L (ref 0.0–2.0)
Bicarbonate: 25.5 mmol/L (ref 20.0–28.0)
Calcium, Ion: 1.3 mmol/L (ref 1.15–1.40)
HCT: 27 % — ABNORMAL LOW (ref 39.0–52.0)
Hemoglobin: 9.2 g/dL — ABNORMAL LOW (ref 13.0–17.0)
O2 Saturation: 99 %
Patient temperature: 97.6
Potassium: 3.8 mmol/L (ref 3.5–5.1)
Sodium: 132 mmol/L — ABNORMAL LOW (ref 135–145)
TCO2: 27 mmol/L (ref 22–32)
pCO2 arterial: 46.1 mmHg (ref 32–48)
pH, Arterial: 7.349 — ABNORMAL LOW (ref 7.35–7.45)
pO2, Arterial: 165 mmHg — ABNORMAL HIGH (ref 83–108)

## 2022-06-28 LAB — TROPONIN I (HIGH SENSITIVITY): Troponin I (High Sensitivity): 208 ng/L (ref <18)

## 2022-06-28 LAB — BLOOD GAS, ARTERIAL
Acid-Base Excess: 2 mmol/L (ref 0.0–2.0)
Bicarbonate: 29.7 mmol/L — ABNORMAL HIGH (ref 20.0–28.0)
Drawn by: 41977
O2 Saturation: 72.1 %
Patient temperature: 37
pCO2 arterial: 59 mmHg — ABNORMAL HIGH (ref 32–48)
pH, Arterial: 7.31 — ABNORMAL LOW (ref 7.35–7.45)
pO2, Arterial: 42 mmHg — ABNORMAL LOW (ref 83–108)

## 2022-06-28 LAB — BASIC METABOLIC PANEL
Anion gap: 12 (ref 5–15)
BUN: 25 mg/dL — ABNORMAL HIGH (ref 8–23)
CO2: 25 mmol/L (ref 22–32)
Calcium: 9 mg/dL (ref 8.9–10.3)
Chloride: 94 mmol/L — ABNORMAL LOW (ref 98–111)
Creatinine, Ser: 4.02 mg/dL — ABNORMAL HIGH (ref 0.61–1.24)
GFR, Estimated: 15 mL/min — ABNORMAL LOW (ref >60)
Glucose, Bld: 117 mg/dL — ABNORMAL HIGH (ref 70–99)
Potassium: 3.6 mmol/L (ref 3.5–5.1)
Sodium: 131 mmol/L — ABNORMAL LOW (ref 135–145)

## 2022-06-28 LAB — AMMONIA: Ammonia: 23 umol/L (ref 9–35)

## 2022-06-28 LAB — T4, FREE: Free T4: 1.12 ng/dL (ref 0.61–1.12)

## 2022-06-28 LAB — LACTATE DEHYDROGENASE: LDH: 158 U/L (ref 98–192)

## 2022-06-28 LAB — MRSA NEXT GEN BY PCR, NASAL: MRSA by PCR Next Gen: NOT DETECTED

## 2022-06-28 LAB — PROCALCITONIN: Procalcitonin: 1.84 ng/mL

## 2022-06-28 MED ORDER — DARBEPOETIN ALFA 150 MCG/0.3ML IJ SOSY
150.0000 ug | PREFILLED_SYRINGE | INTRAMUSCULAR | Status: DC
  Filled 2022-06-28: qty 0.3

## 2022-06-28 MED ORDER — CHLORHEXIDINE GLUCONATE CLOTH 2 % EX PADS
6.0000 | MEDICATED_PAD | Freq: Every day | CUTANEOUS | Status: DC
  Administered 2022-06-28 – 2022-07-11 (×10): 6 via TOPICAL

## 2022-06-28 MED ORDER — ALBUMIN HUMAN 25 % IV SOLN
INTRAVENOUS | Status: AC
  Administered 2022-06-28: 25 g via INTRAVENOUS
  Filled 2022-06-28: qty 100

## 2022-06-28 MED ORDER — BISACODYL 10 MG RE SUPP: 10.0000 mg | Freq: Every day | RECTAL | Status: DC | PRN

## 2022-06-28 MED ORDER — MIDODRINE HCL 5 MG PO TABS
10.0000 mg | ORAL_TABLET | Freq: Three times a day (TID) | ORAL | Status: DC
  Administered 2022-06-29 (×3): 10 mg via ORAL
  Filled 2022-06-28 (×3): qty 2

## 2022-06-28 MED ORDER — OXYCODONE HCL 5 MG PO TABS
5.0000 mg | ORAL_TABLET | ORAL | Status: DC | PRN
  Administered 2022-06-29: 5 mg via ORAL
  Filled 2022-06-28: qty 1

## 2022-06-28 MED ORDER — ALBUMIN HUMAN 25 % IV SOLN: 25.0000 g | Freq: Once | INTRAVENOUS | Status: AC

## 2022-06-28 NOTE — Progress Notes (Addendum)
PROGRESS NOTE    [name].  [id number] DOB: 14-Sep-1955 DOA: 06/24/2022 PCP: [name], MD    Brief Narrative:  [name]. is a 67 y.o. male with medical history significant of recent hospitalization with bacteremia,  ESRD on HD, DM.  Patient was brought in from [place] [facility] with c/o SOB.  He had over 1/2 of his HD remaining as he only completed 1 of his 4 hours.  Patient denies chest pain, denies fevers, denies chills.  Patient reports he had fallen asleep and woke up coughing and was sent to the ER.  O2 sats were 95% on RA.   No fevers, no chills   In the ER, x ray showed volume overload, elevated BNP, and low K.  ER doc spoke with renal MD who suggested admission and to finish his HD session.  Appears that will not happen until 3/23.    I called the [facility] to figure out why they sent him here and was told by the RN that the NP listened to the patient and did not hear any "air movement" and was concerned for PNA so sent him to the ER without completing his HD.  Got HD 3/23, developed worsening SOB on 3/24 so moved to ICU with BIPAP.  3/25 HD Again.     Assessment and Plan:  Worsening respiratory failure- resp acidosis  -ABG done and placed on BIPAP -HD 3/25 with 2.9L removed -repeat x ray on 3/26 shows pleural effusion-- worse on right-- will order thoracentesis  Volume overload in an ESRD patient -sent from HD for SOB -was 99.9kg when d/c'd from the hospital last -only got 1 hour on Friday so volume overloaded at admission--  HD 3/23 with 2.3L removed and again on 3/25 2.9L removed -no fever so doubt infection -not abiding by fluid restriction here so doubt he follows at SNF as well so suspect this may be contributing to volume overload -CTA done with no PE, but shows chf - limited echo to compare to prior TEE: EF down from 45-50 to 30-35%-- troponins pending but initial was only slightly elevated -renal consulted for continued HD   Elevated TSH -added on  free t4  Recent hospitalization for Bacteremia with blood cx growing [place] with history of HD catheter and AICD device, TEE suspicious for aortic root abscess, AICD vegetation, tricuspid valve endocarditis -  He is on IV antibiotics per ID, underwent tunneled catheter removal by IR on 05/27/2022, new tunneled right IJ dialysis catheter was placed on 05/30/2022 after line holidays.   Per ID - Cefazolin with HD 6 weeks plus rifampin 300 mg PO bid for 6 weeks (EOT 07/08/22) .  -repeat limited echo -pro calcitonin mildly elevated but still on abx  obesity due to excess calories with body mass index (BMI) of 33.0 to 33.9 in adult -Estimated body mass index is 72.06 kg/m as calculated from the following:   Height as of this encounter: 4\' 3"  (1.295 m).   Weight as of this encounter: 120.9 kg.    Kidney lesion, native, left -Incidentally noted on CT -Outpatient MRI recommended    ESRD (end stage renal disease) on dialysis ([place]) -Patient on chronic MWF HD -see above   Anemia -Associated with ESRD -monitor   DM type 2 with peripheral vascular complications (HCC) -123456 5.6, good control -SSI   Hypokalemia -fixed in HD   [facility] consult for return to [facility] when medically ready    Patient with worsening mental  function and unable to come off Bipap-- discussed with Dr. Verlee Monte and plan will be tx to [facility].   DVT prophylaxis: heparin injection 5,000 Units Start: 06/24/22 1800    Code Status: Full Code Updated son, called brother but NA  Disposition Plan:  Level of care: Stepdown      Consultants:  Renal pulmonary   Subjective: Has not been able to come on and off bipap today without bradycardia and lethargy   Objective: Vitals:   06/28/22 0528 06/28/22 0530 06/28/22 0630 06/28/22 0700  BP:   (!) 69/49 138/68  Pulse: 71 69 81   Resp: 13 14 (!) 21 19  Temp:      TempSrc:      SpO2: 100% 100% 100% 100%  Weight:      Height:        Intake/Output Summary (Last 24 hours) at  06/28/2022 [id number] Last data filed at 06/27/2022 2013 Gross per 24 hour  Intake 100 ml  Output 2900 ml  Net -2800 ml   Filed Weights   06/25/22 1330 06/27/22 1415 06/27/22 1830  Weight: 115.1 kg 115.8 kg 112.9 kg    Examination:   General: Appearance:    Severely obese male in no acute distress- appears comfortable on BIPAP     Lungs:     respirations unlabored, diminished  Heart:    Normal heart rate.   MS:   Amputations noted  Neurologic:   Resting on BIPAP         Data Reviewed: I have personally reviewed following labs and imaging studies  CBC: Recent Labs  Lab 06/24/22 1334 06/25/22 0659 06/27/22 0646  WBC 11.3* 12.3* 11.4*  NEUTROABS 8.9*  --   --   HGB 9.3* 9.1* 8.9*  HCT 30.5* 29.9* 29.6*  MCV 101.7* 102.4* 102.1*  PLT 168 195 0000000   Basic Metabolic Panel: Recent Labs  Lab 06/24/22 1357 06/25/22 0500 06/27/22 0646  NA 131* 132* 129*  K 3.4* 3.5 4.2  CL 94* 95* 92*  CO2 27 25 24   GLUCOSE 119* 112* 157*  BUN 20 24* 26*  CREATININE 4.11* 4.74* 4.70*  CALCIUM 9.0 9.1 9.0  MG  --   --  2.3  PHOS  --   --  4.7*   GFR: Estimated Creatinine Clearance: 13.2 mL/min (A) (by C-G formula based on SCr of 4.7 mg/dL (H)). Liver Function Tests: Recent Labs  Lab 06/24/22 1357 06/27/22 0646  AST 16 14*  ALT <5 <5  ALKPHOS 87 84  BILITOT 0.7 1.0  PROT 6.2* 6.3*  ALBUMIN 2.5* 2.6*   No results for input(s): "LIPASE", "AMYLASE" in the last 168 hours. No results for input(s): "AMMONIA" in the last 168 hours. Coagulation Profile: No results for input(s): "INR", "PROTIME" in the last 168 hours. Cardiac Enzymes: No results for input(s): "CKTOTAL", "CKMB", "CKMBINDEX", "TROPONINI" in the last 168 hours. BNP (last 3 results) No results for input(s): "PROBNP" in the last 8760 hours. HbA1C: No results for input(s): "HGBA1C" in the last 72 hours. CBG: Recent Labs  Lab 06/27/22 0528 06/27/22 0721 06/27/22 1120 06/27/22 1612 06/27/22 2200  GLUCAP 149* 151*  125* 105* 111*   Lipid Profile: No results for input(s): "CHOL", "HDL", "LDLCALC", "TRIG", "CHOLHDL", "LDLDIRECT" in the last 72 hours. Thyroid Function Tests: Recent Labs    06/27/22 0750  TSH 5.166*   Anemia Panel: No results for input(s): "VITAMINB12", "FOLATE", "FERRITIN", "TIBC", "IRON", "RETICCTPCT" in the last 72 hours. Sepsis Labs: Recent Labs  Lab 06/27/22  [id number] 06/27/22 1002  PROCALCITON 0.86  --   LATICACIDVEN 1.5 1.4    Recent Results (from the past 240 hour(s))  Resp panel by RT-PCR (RSV, Flu A&B, Covid) Anterior Nasal Swab     Status: None   Collection Time: 06/24/22  1:35 PM   Specimen: Anterior Nasal Swab  Result Value Ref Range Status   SARS Coronavirus 2 by RT PCR NEGATIVE NEGATIVE Final    Comment: (NOTE) SARS-CoV-2 target nucleic acids are NOT DETECTED.  The SARS-CoV-2 RNA is generally detectable in upper respiratory specimens during the acute phase of infection. The lowest concentration of SARS-CoV-2 viral copies this assay can detect is 138 copies/mL. A negative result does not preclude SARS-Cov-2 infection and should not be used as the sole basis for treatment or other patient management decisions. A negative result may occur with  improper specimen collection/handling, submission of specimen other than nasopharyngeal swab, presence of viral mutation(s) within the areas targeted by this assay, and inadequate number of viral copies(<138 copies/mL). A negative result must be combined with clinical observations, patient history, and epidemiological information. The expected result is Negative.  Fact Sheet for Patients:  EntrepreneurPulse.com.au  Fact Sheet for Healthcare Providers:  IncredibleEmployment.be  This test is no t yet approved or cleared by the [place] FDA and  has been authorized for detection and/or diagnosis of SARS-CoV-2 by FDA under an Emergency Use Authorization (EUA). This EUA will remain   in effect (meaning this test can be used) for the duration of the COVID-19 declaration under Section 564(b)(1) of the Act, 21 U.S.C.section 360bbb-3(b)(1), unless the authorization is terminated  or revoked sooner.       Influenza A by PCR NEGATIVE NEGATIVE Final   Influenza B by PCR NEGATIVE NEGATIVE Final    Comment: (NOTE) The Xpert Xpress SARS-CoV-2/FLU/RSV plus assay is intended as an aid in the diagnosis of influenza from Nasopharyngeal swab specimens and should not be used as a sole basis for treatment. Nasal washings and aspirates are unacceptable for Xpert Xpress SARS-CoV-2/FLU/RSV testing.  Fact Sheet for Patients: EntrepreneurPulse.com.au  Fact Sheet for Healthcare Providers: IncredibleEmployment.be  This test is not yet approved or cleared by the [place] FDA and has been authorized for detection and/or diagnosis of SARS-CoV-2 by FDA under an Emergency Use Authorization (EUA). This EUA will remain in effect (meaning this test can be used) for the duration of the COVID-19 declaration under Section 564(b)(1) of the Act, 21 U.S.C. section 360bbb-3(b)(1), unless the authorization is terminated or revoked.     Resp Syncytial Virus by PCR NEGATIVE NEGATIVE Final    Comment: (NOTE) Fact Sheet for Patients: EntrepreneurPulse.com.au  Fact Sheet for Healthcare Providers: IncredibleEmployment.be  This test is not yet approved or cleared by the [place] FDA and has been authorized for detection and/or diagnosis of SARS-CoV-2 by FDA under an Emergency Use Authorization (EUA). This EUA will remain in effect (meaning this test can be used) for the duration of the COVID-19 declaration under Section 564(b)(1) of the Act, 21 U.S.C. section 360bbb-3(b)(1), unless the authorization is terminated or revoked.  Performed at [facility], [address], [place], [place] [address]   MRSA Next Gen by  PCR, Nasal     Status: None   Collection Time: 06/25/22  5:09 AM   Specimen: Nasal Mucosa; Nasal Swab  Result Value Ref Range Status   MRSA by PCR Next Gen NOT DETECTED NOT DETECTED Final    Comment: (NOTE) The GeneXpert MRSA Assay (FDA approved for  NASAL specimens only), is one component of a comprehensive MRSA colonization surveillance program. It is not intended to diagnose MRSA infection nor to guide or monitor treatment for MRSA infections. Test performance is not FDA approved in patients less than 53 years old. Performed at [facility], [address], [place], [place] [address]          Radiology Studies: ECHOCARDIOGRAM LIMITED  Result Date: 06/27/2022    ECHOCARDIOGRAM LIMITED REPORT   Patient Name:   Laim Boogaard. Date of Exam: 06/27/2022 Medical Rec #:  [id number]         Height:       51.0 in Accession #:    [id number]        Weight:       253.7 lb Date of Birth:  October 19, 1955         BSA:          1.835 m Patient Age:    13 years          BP:           133/48 mmHg Patient Gender: M                 HR:           86 bpm. Exam Location:  [facility] Procedure: Limited Echo, Limited Color Doppler, Cardiac Doppler and Intracardiac            Opacification Agent Indications:    CHF-Acute Systolic [id number]  History:        Patient has prior history of Echocardiogram examinations, most                 recent 05/27/2022. Cardiomyopathy and CHF, TIA; Risk                 Factors:Hypertension, Dyslipidemia, Former Smoker and Diabetes.  Sonographer:    Greer Pickerel Referring Phys: [phone] Emilio Baylock U Eliseo Squires  Sonographer Comments: Technically challenging study due to limited acoustic windows. Image acquisition challenging due to patient body habitus and Image acquisition challenging due to respiratory motion. IMPRESSIONS  1. Limited study.  2. Left ventricular ejection fraction, by estimation, is 30 to 35%. The left ventricle has moderately decreased function. The left ventricle demonstrates regional wall  motion abnormalities (see scoring diagram/findings for description). The left ventricular internal cavity size was moderately dilated.  3. No formed LV mural thrombus identified with Definity contrast.  4. Right ventricular systolic function is normal. The right ventricular size is normal. Device lead present.  5. The mitral valve is degenerative. Trivial mitral valve regurgitation.  6. The aortic valve was not well visualized. Aortic valve regurgitation is mild.  7. Unable to estimate CVP. Comparison(s): Prior images reviewed side by side. LVEF has decreased in comparison, now 30-35% range. FINDINGS  Left Ventricle: Left ventricular ejection fraction, by estimation, is 30 to 35%. The left ventricle has moderately decreased function. The left ventricle demonstrates regional wall motion abnormalities. Definity contrast agent was given IV to delineate the left ventricular endocardial borders. The left ventricular internal cavity size was moderately dilated. There is no left ventricular hypertrophy.  LV Wall Scoring: The mid anteroseptal segment, mid inferoseptal segment, mid inferior segment, and basal inferoseptal segment are akinetic. The basal anteroseptal segment, apical septal segment, apical anterior segment, basal inferior segment, apical inferior segment, and apex are hypokinetic. The anterior wall and entire lateral wall are normal. Right Ventricle: The right ventricular size is normal. No increase in right ventricular wall thickness.  Right ventricular systolic function is normal. Pericardium: There is no evidence of pericardial effusion. Mitral Valve: The mitral valve is degenerative in appearance. There is mild thickening of the mitral valve leaflet(s). There is mild calcification of the mitral valve leaflet(s). Mild to moderate mitral annular calcification. Trivial mitral valve regurgitation. Tricuspid Valve: The tricuspid valve is grossly normal. Tricuspid valve regurgitation is trivial. Aortic Valve: The  aortic valve was not well visualized. Aortic valve regurgitation is mild. Aorta: Aortic root could not be assessed. Venous: Unable to estimate CVP. The inferior vena cava was not well visualized. Additional Comments: A device lead is visualized. Spectral Doppler performed. Color Doppler performed.  LEFT VENTRICLE PLAX 2D LVIDd:         6.40 cm LVIDs:         5.30 cm LV PW:         1.00 cm LV IVS:        0.90 cm  MITRAL VALVE                TRICUSPID VALVE MV Area (PHT): 3.16 cm     TR Peak grad:   23.6 mmHg MV Decel Time: 240 msec     TR Vmax:        243.00 cm/s MV E velocity: 195.00 cm/s MV A velocity: 109.00 cm/s MV E/A ratio:  1.79 Rozann Lesches MD Electronically signed by Rozann Lesches MD Signature Date/Time: 06/27/2022/1:49:09 PM    Final    CT Angio Chest Pulmonary Embolism (PE) W or WO Contrast  Result Date: 06/27/2022 CLINICAL DATA:  Shortness of breath EXAM: CT ANGIOGRAPHY CHEST WITH CONTRAST TECHNIQUE: Multidetector CT imaging of the chest was performed using the standard protocol during bolus administration of intravenous contrast. Multiplanar CT image reconstructions and MIPs were obtained to evaluate the vascular anatomy. RADIATION DOSE REDUCTION: This exam was performed according to the departmental dose-optimization program which includes automated exposure control, adjustment of the mA and/or kV according to patient size and/or use of iterative reconstruction technique. CONTRAST:  85mL OMNIPAQUE IOHEXOL 350 MG/ML SOLN 13 hour premedication prep was administered. COMPARISON:  Chest x-ray from 06/25/2022 FINDINGS: Cardiovascular: Atherosclerotic calcifications of the thoracic aorta are noted. No aneurysmal dilatation or dissection is seen. Heart is mildly enlarged in size. Pacing device is again seen. The pulmonary artery shows a normal branching pattern bilaterally. No filling defect to suggest pulmonary embolism is noted. Mediastinum/Nodes: Thoracic inlet is within normal limits. No hilar or  mediastinal adenopathy is noted. The esophagus as visualized is within normal limits. Lungs/Pleura: Bilateral moderate pleural effusions are noted right slightly greater than left. Associated lower lobe consolidation is noted again right greater than left. Mild central vascular congestion is seen. Patchy airspace opacities are noted likely representing edema although inflammatory change cannot be totally excluded. No parenchymal nodules are noted. Upper Abdomen: Visualized upper abdomen is within normal limits. Musculoskeletal: No chest wall abnormality. No acute or significant osseous findings. Review of the MIP images confirms the above findings. IMPRESSION: No evidence of pulmonary emboli. Changes most suggestive of CHF with edema and bilateral effusions. Aortic Atherosclerosis (ICD10-I70.0). Electronically Signed   By: Inez Catalina M.D.   On: 06/27/2022 00:37        Scheduled Meds:  carvedilol  3.125 mg Oral BID WC   Chlorhexidine Gluconate Cloth  6 each Topical Daily   clopidogrel  75 mg Oral QHS   diclofenac Sodium  2 g Topical QID   gabapentin  300 mg Oral QHS   heparin  5,000 Units Subcutaneous Q8H   insulin aspart  0-5 Units Subcutaneous QHS   insulin aspart  0-9 Units Subcutaneous TID WC   melatonin  3 mg Oral QHS   midodrine  10 mg Oral TID WC   pantoprazole  40 mg Oral Daily   rifampin  300 mg Oral BID   Continuous Infusions:   ceFAZolin (ANCEF) IV Stopped (06/27/22 1050)     LOS: 2 days    Time spent: 45 minutes spent on chart review, discussion with nursing staff, consultants, updating family and interview/physical exam; more than 50% of that time was spent in counseling and/or coordination of care.    [name], DO [facility] Available via Epic secure chat 7am-7pm After these hours, please refer to coverage provider listed on [email or web address] 06/28/2022, 7:52 AM

## 2022-06-28 NOTE — Progress Notes (Signed)
Pt has pulled bipap off multiple times then asks later to have it back on, pt complains he's hot and takes off blankets and sheets while axillary temp has been around 96 degrees, I have educated the pt multiple times and gave haldol to help with agitation.

## 2022-06-28 NOTE — Progress Notes (Signed)
Pt off BIPAP 

## 2022-06-28 NOTE — Progress Notes (Signed)
RT took patient off of BIPAP and placed on 2L nasal cannula. BIPAP remains at bedside when needed.

## 2022-06-28 NOTE — Progress Notes (Signed)
Per night shift RT patient had several episodes where he removed himself from the BIPAP but was placed back on. This morning upon entering the room the patient was sleeping on BIPAP. RT left patient on BIPAP due to CO2 levels per ABG. RN aware.

## 2022-06-28 NOTE — IPAL (Signed)
  Interdisciplinary Goals of [name] Family Meeting   Date carried out:: 06/28/2022  Location of the meeting: Bedside  Member's involved: Physician and Bedside Registered Nurse  Durable Power of Attorney or acting medical decision maker: patient    Discussion: We discussed goals of [name] for [name]. .  [name] and I discussed his ongoing [name]. He does not have an advanced directive. He wants his brother [name] to be his surrogate decision maker if he is unable. He is fine with short-term intubation if there was a reversible process, but would not want long-term MV. He would want to be DNR and pass away naturally if he had a cardiac arrest.   Code status: Mechanical ventilation (short-term only, not in code situation), Cental IV access, and IV vasoactive drugs. DNR if cardiac arrest.  Disposition: Continue current acute [name]   Time spent for the meeting: 10 min.  Julian Hy 06/28/2022, 7:20 PM

## 2022-06-28 NOTE — Progress Notes (Signed)
PCCM Brief Progress Note  Spoke with RT at bedside, confirmed he is arousable and follows commands. Current tidal volumes are ~600 despite fairly low EPAP of 6. I recommended increasing EPAP to 8-10 accounting for his neck size and likely OSA/OHS. We'll see what ABG shows this morning.   [place]

## 2022-06-28 NOTE — Progress Notes (Signed)
[name] is a 67 y/o gentleman with a history of ESRD on HD, recent endocarditis with AI who presented to [facility] on 3/22 with SOB during HD. He had HD stopped prematurely due to this SOB, which was felt to be due to hypervolemia. He was admitted in February for Staph lugdunesis. Due to that infection he had his TDC removed and replaced. 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 This device appears to remain in place on CXR. He develoepd progressive respiratory failure and hypoxia requiring BiPAP today. He was transferred to [facility] for ICU care. He continues to complain of SOB. He remains on cefazolin and rifampin for his known previous bacteremia. Non-smoker, no history of COPD.  BP (!) 155/65   Pulse 78   Temp (!) 96.4 F (35.8 C)   Resp 17   Ht 4\' 3"  (1.295 m)   Wt 112.9 kg   SpO2 100%   BMI 67.28 kg/m  Chronically ill appearing man sitting up in bed in NAD Reduced basilar breath sounds, CTAB otherwise on BiPAP. No tachypnea, no accessory muscle use. No cough observed. S1S2, RRR Abd obese soft, NT TDC RIJ, no surrounding erythema.  Awake but sleepy, able to answer questions appropriately. Moving extremities. Pitting edema BLE, AKA b/l Psoriasis rash on left elbow   CXR personally reviewed> increasing R infiltrate and seems disproportionate to left side, bilateral pleural effusions  Na+  131 BUN 25 Cr 4.02 Trop 209 WBC 9.7 H/H 7.9/25.9 Platelets 143 Blood cultures repeated today  ABG 7.31/59/42/30  Assessment & plan: Acute respiratory failure with hypoxia; worry about R pneumonia on top of hypervolemia with acute pulmonary edema and bilateral pleural effusions -volume management with HD -check PCT; if increased would start empiric HAP antibiotics. At this point with no fever or leukocytosis there is not compelling evidence to do this.  -con't BiPAP -repeat ABG now  ESRD with hypervolemia -iHD planned  for tomorrow per nephrology; has TDC if they were to need CRRT -strict I/O -switching hydrocodone to oxycodone PRN -aranesp per nephrotology  Chronic hypotension -con't midodrine; may be able to wean off if he remains hypertensive -ok to con't coreg for now  I discussed his goals of care. He does not have an advanced directive. He wants his brother [name] to be his surrogate decision maker if he is unable. He is fine with short-term intubation if there was a reversible process, but would not want long-term MV. He would want to be DNR and pass away naturally if he had a cardiac arrest.  This patient is critically ill with multiple organ system failure which requires frequent high complexity decision making, assessment, support, evaluation, and titration of therapies. This was completed through the application of advanced monitoring technologies and extensive interpretation of multiple databases. During this encounter critical care time was devoted to patient care services described in this note for 35 minutes.   Julian Hy, DO 06/28/22 7:18 PM [facility]  For contact information, see Amion. If no response to pager, please call PCCM consult pager. After hours, 7PM- 7AM, please call Elink.

## 2022-06-28 NOTE — Progress Notes (Signed)
Subjective:  HD late yesterday removed 2.9 liters-  no hypotension recorded-  then was on room air briefly prior to being placed on bipap for the night -  this AM he is sound asleep on bipap but nursing said was off mask earlier   Objective Vital signs in last 24 hours: Vitals:   06/28/22 0530 06/28/22 0630 06/28/22 0700 06/28/22 0757  BP:  (!) 69/49 138/68   Pulse: 69 81    Resp: 14 (!) 21 19   Temp:    (!) 97.5 F (36.4 C)  TempSrc:    Rectal  SpO2: 100% 100% 100%   Weight:      Height:       Weight change:   Intake/Output Summary (Last 24 hours) at 06/28/2022 [id number] Last data filed at 06/27/2022 2013 Gross per 24 hour  Intake 100 ml  Output 2900 ml  Net -2800 ml    Assessment/ Plan: Pt is a 67 y.o. yo male with ESRD who was admitted on 06/24/2022 with SOB/resp failure but also with recent bacteremia still on abx Assessment/Plan: 1. Sob/resp failure-  seems mostly consistent with volume overload-  UF with HD as able- limited by hypotension 2. ESRD -  normally MWF at [facility] via [facility] ( was replaced in Feb due to bacteremia)  plan on HD tomorrow on schedule 3. Anemia-  will add ESA for hgb under 10  4. Secondary hyperparathyroidism-  current phos and calcium WNL-  on no bone meds at this time 5. HTN/volume-  hyponatremia suggests volume overload-  UF as able with HD -  is on max dose midodrine but also on tiny dose coreg 6. Bacteremia-  still on ancef and rifampin- end date in April   Kendrew Paci A Manjot Beumer    Labs: Basic Metabolic Panel: Recent Labs  Lab 06/24/22 1357 06/25/22 0500 06/27/22 0646  NA 131* 132* 129*  K 3.4* 3.5 4.2  CL 94* 95* 92*  CO2 27 25 24   GLUCOSE 119* 112* 157*  BUN 20 24* 26*  CREATININE 4.11* 4.74* 4.70*  CALCIUM 9.0 9.1 9.0  PHOS  --   --  4.7*   Liver Function Tests: Recent Labs  Lab 06/24/22 1357 06/27/22 0646  AST 16 14*  ALT <5 <5  ALKPHOS 87 84  BILITOT 0.7 1.0  PROT 6.2* 6.3*  ALBUMIN 2.5* 2.6*   No results for  input(s): "LIPASE", "AMYLASE" in the last 168 hours. No results for input(s): "AMMONIA" in the last 168 hours. CBC: Recent Labs  Lab 06/24/22 1334 06/25/22 0659 06/27/22 0646  WBC 11.3* 12.3* 11.4*  NEUTROABS 8.9*  --   --   HGB 9.3* 9.1* 8.9*  HCT 30.5* 29.9* 29.6*  MCV 101.7* 102.4* 102.1*  PLT 168 195 175   Cardiac Enzymes: No results for input(s): "CKTOTAL", "CKMB", "CKMBINDEX", "TROPONINI" in the last 168 hours. CBG: Recent Labs  Lab 06/27/22 0721 06/27/22 1120 06/27/22 1612 06/27/22 2200 06/28/22 0754  GLUCAP 151* 125* 105* 111* 97    Iron Studies: No results for input(s): "IRON", "TIBC", "TRANSFERRIN", "FERRITIN" in the last 72 hours. Studies/Results: ECHOCARDIOGRAM LIMITED  Result Date: 06/27/2022    ECHOCARDIOGRAM LIMITED REPORT   Patient Name:   Demaris Laverty. Date of Exam: 06/27/2022 Medical Rec #:  [id number]         Height:       51.0 in Accession #:    [id number]        Weight:  253.7 lb Date of Birth:  1955-04-10         BSA:          1.835 m Patient Age:    63 years          BP:           133/48 mmHg Patient Gender: M                 HR:           86 bpm. Exam Location:  [facility] Procedure: Limited Echo, Limited Color Doppler, Cardiac Doppler and Intracardiac            Opacification Agent Indications:    CHF-Acute Systolic [id number]  History:        Patient has prior history of Echocardiogram examinations, most                 recent 05/27/2022. Cardiomyopathy and CHF, TIA; Risk                 Factors:Hypertension, Dyslipidemia, Former Smoker and Diabetes.  Sonographer:    Greer Pickerel Referring Phys: [phone] JESSICA U Eliseo Squires  Sonographer Comments: Technically challenging study due to limited acoustic windows. Image acquisition challenging due to patient body habitus and Image acquisition challenging due to respiratory motion. IMPRESSIONS  1. Limited study.  2. Left ventricular ejection fraction, by estimation, is 30 to 35%. The left ventricle has moderately decreased  function. The left ventricle demonstrates regional wall motion abnormalities (see scoring diagram/findings for description). The left ventricular internal cavity size was moderately dilated.  3. No formed LV mural thrombus identified with Definity contrast.  4. Right ventricular systolic function is normal. The right ventricular size is normal. Device lead present.  5. The mitral valve is degenerative. Trivial mitral valve regurgitation.  6. The aortic valve was not well visualized. Aortic valve regurgitation is mild.  7. Unable to estimate CVP. Comparison(s): Prior images reviewed side by side. LVEF has decreased in comparison, now 30-35% range. FINDINGS  Left Ventricle: Left ventricular ejection fraction, by estimation, is 30 to 35%. The left ventricle has moderately decreased function. The left ventricle demonstrates regional wall motion abnormalities. Definity contrast agent was given IV to delineate the left ventricular endocardial borders. The left ventricular internal cavity size was moderately dilated. There is no left ventricular hypertrophy.  LV Wall Scoring: The mid anteroseptal segment, mid inferoseptal segment, mid inferior segment, and basal inferoseptal segment are akinetic. The basal anteroseptal segment, apical septal segment, apical anterior segment, basal inferior segment, apical inferior segment, and apex are hypokinetic. The anterior wall and entire lateral wall are normal. Right Ventricle: The right ventricular size is normal. No increase in right ventricular wall thickness. Right ventricular systolic function is normal. Pericardium: There is no evidence of pericardial effusion. Mitral Valve: The mitral valve is degenerative in appearance. There is mild thickening of the mitral valve leaflet(s). There is mild calcification of the mitral valve leaflet(s). Mild to moderate mitral annular calcification. Trivial mitral valve regurgitation. Tricuspid Valve: The tricuspid valve is grossly normal.  Tricuspid valve regurgitation is trivial. Aortic Valve: The aortic valve was not well visualized. Aortic valve regurgitation is mild. Aorta: Aortic root could not be assessed. Venous: Unable to estimate CVP. The inferior vena cava was not well visualized. Additional Comments: A device lead is visualized. Spectral Doppler performed. Color Doppler performed.  LEFT VENTRICLE PLAX 2D LVIDd:         6.40 cm LVIDs:  5.30 cm LV PW:         1.00 cm LV IVS:        0.90 cm  MITRAL VALVE                TRICUSPID VALVE MV Area (PHT): 3.16 cm     TR Peak grad:   23.6 mmHg MV Decel Time: 240 msec     TR Vmax:        243.00 cm/s MV E velocity: 195.00 cm/s MV A velocity: 109.00 cm/s MV E/A ratio:  1.79 Rozann Lesches MD Electronically signed by Rozann Lesches MD Signature Date/Time: 06/27/2022/1:49:09 PM    Final    CT Angio Chest Pulmonary Embolism (PE) W or WO Contrast  Result Date: 06/27/2022 CLINICAL DATA:  Shortness of breath EXAM: CT ANGIOGRAPHY CHEST WITH CONTRAST TECHNIQUE: Multidetector CT imaging of the chest was performed using the standard protocol during bolus administration of intravenous contrast. Multiplanar CT image reconstructions and MIPs were obtained to evaluate the vascular anatomy. RADIATION DOSE REDUCTION: This exam was performed according to the departmental dose-optimization program which includes automated exposure control, adjustment of the mA and/or kV according to patient size and/or use of iterative reconstruction technique. CONTRAST:  65mL OMNIPAQUE IOHEXOL 350 MG/ML SOLN 13 hour premedication prep was administered. COMPARISON:  Chest x-ray from 06/25/2022 FINDINGS: Cardiovascular: Atherosclerotic calcifications of the thoracic aorta are noted. No aneurysmal dilatation or dissection is seen. Heart is mildly enlarged in size. Pacing device is again seen. The pulmonary artery shows a normal branching pattern bilaterally. No filling defect to suggest pulmonary embolism is noted.  Mediastinum/Nodes: Thoracic inlet is within normal limits. No hilar or mediastinal adenopathy is noted. The esophagus as visualized is within normal limits. Lungs/Pleura: Bilateral moderate pleural effusions are noted right slightly greater than left. Associated lower lobe consolidation is noted again right greater than left. Mild central vascular congestion is seen. Patchy airspace opacities are noted likely representing edema although inflammatory change cannot be totally excluded. No parenchymal nodules are noted. Upper Abdomen: Visualized upper abdomen is within normal limits. Musculoskeletal: No chest wall abnormality. No acute or significant osseous findings. Review of the MIP images confirms the above findings. IMPRESSION: No evidence of pulmonary emboli. Changes most suggestive of CHF with edema and bilateral effusions. Aortic Atherosclerosis (ICD10-I70.0). Electronically Signed   By: Inez Catalina M.D.   On: 06/27/2022 00:37   Medications: Infusions:   ceFAZolin (ANCEF) IV Stopped (06/27/22 1050)    Scheduled Medications:  carvedilol  3.125 mg Oral BID WC   Chlorhexidine Gluconate Cloth  6 each Topical Daily   clopidogrel  75 mg Oral QHS   diclofenac Sodium  2 g Topical QID   gabapentin  300 mg Oral QHS   heparin  5,000 Units Subcutaneous Q8H   insulin aspart  0-5 Units Subcutaneous QHS   insulin aspart  0-9 Units Subcutaneous TID WC   melatonin  3 mg Oral QHS   midodrine  10 mg Oral TID WC   pantoprazole  40 mg Oral Daily   rifampin  300 mg Oral BID    have reviewed scheduled and prn medications.  Physical Exam: General: sound asleep -  not arousable on bipap for me Heart: RRR Lungs: poor air movement  Abdomen: distended  Extremities: no peripheral edema  Dialysis Access: right sided TDC     06/28/2022,8:24 AM  LOS: 2 days

## 2022-06-28 NOTE — Progress Notes (Signed)
About 1035 he woke up pull everything off I got him comfortable in bed sitting up and let him drink some water O2 sats were around 95 on Room air. Then about 1050 heart rate dropped as low as 35 and sats were around 86 on room air. Ran into room and he just looked completely dazed. immediately put Bipap on and and started asking him to squeeze my hands (which he was able to do) Heart rate came back up soon after bipap, now 85 and 100%. Will continue patient on Bipap. Dr. Eliseo Squires and Dr. Verlee Monte aware.

## 2022-06-28 NOTE — Progress Notes (Addendum)
NAME:  [name]., MRN:  [id number], DOB:  Aug 08, 1955, LOS: 2 ADMISSION DATE:  06/24/2022, CONSULTATION DATE:  06/27/22 REFERRING MD:  Eliseo Squires, [facility], CHIEF COMPLAINT:  acute resp distress    History of Present Illness:  65 yobm remote smoker HD pt with endocarditis/ Aortic insufficiency admtted p unabel to complete HD 3/22 with evidence of vol overloadd / bilateral pl effusions on admit > bipap dependent resp failure and PCCM service requested eval am 3/25.   Pertinent  Medical History   34 yobm with medical history significant of recent hospitalization with bacteremia,  ESRD on HD, DM.  Patient was brought in from [facility] with c/o SOB.  He had over 1/2 of his HD remaining as he only completed 1 of his 4 hours on 3/22.  Patient denied chest pain, denies fevers, denies chills.  Patient reported he had fallen asleep and woke up coughing and was sent to the ER.  O2 sats were 95% on RA.       In the ER, x ray showed volume overload, elevated BNP, and low K.  ER doc spoke with renal MD who suggested admission and to finish his HD session.    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 Perforation of left coronary cusp aortic valve leaflet. The aortic  valve is tricuspid. Aortic valve regurgitation is moderate to severe. Mild  aortic stenosis.   3. Large vegetation measuring 1.5 cm x 1.5 cm appears attached to both  ICD lead and septal lealfet of tricuspid valve on atrial side. Mild  tricuspid regurgitation. There is another vegetation measuring 1.3 cm x  0.4 cm attached to ICD lead in right  atrium.   4. Left ventricular ejection fraction, by estimation, is 45 to 50%. The  left ventricle has mildly decreased function.   5. Right ventricular systolic function is normal. The right ventricular  size is mildly enlarged   6. The mitral valve is degenerative. Trivial mitral valve regurgitation.   7. No left atrial/left atrial  appendage thrombus was detected.     Significant Hospital Events: Including procedures, antibiotic start and stop dates in addition to other pertinent events   Viral Panel 06/24/22 neg  Rocephin/ Rifampin pre admit and continued 3/22>>> MRSA PCR  3/23  neg  CTa 3/24 c/w chf with edema and R > L effusions  Repeat echo 3/25 >>>  Interim History / Subjective:  Doesn't seem that responsive on BiPAP this morning but I am unable to verify his IPAP, EPAP, or tidal volumes.   Objective   Blood pressure 138/68, pulse 81, temperature (!) 97.5 F (36.4 C), temperature source Rectal, resp. rate 19, height 4\' 3"  (1.295 m), weight 112.9 kg, SpO2 100 %.    FiO2 (%):  [35 %] 35 %   Intake/Output Summary (Last 24 hours) at 06/28/2022 1009 Last data filed at 06/27/2022 2013 Gross per 24 hour  Intake 100 ml  Output 2900 ml  Net -2800 ml   Filed Weights   06/25/22 1330 06/27/22 1415 06/27/22 1830  Weight: 115.1 kg 115.8 kg 112.9 kg    Examination: Tmax:  99 General appearance:    obese bm asleep on bipap with VT 450-500 and RR 28   At Rest 02 sats  100% on 0.35   Oropharynx mask in place  Equal chest rise, normal WOB on NIV RRR Abd obese  Neuro sleeping, moving bue spontaneously  CXR similar to prior with low volumes, bilateral effusions and lower lobe atelectasis  Assessment & Plan:  # Acute on chronic hypercapnic, acute hypoxic respiratory failure # Suspected OHS/OSA # Acute on chronic systolic heart failure  - check ABG, blood cultures - increase EPAP to 10 - wean FiO2 with NIV for saturation 88-92% - if remains arousable and attentive ok to come off NIV to nasal cannula targeting saturation 88-92% - iHD with UF as able - recommend diagnostic/therapeutic R thora given recent staff bacteremia and endocarditis unless there no longer remains sufficient pocket even for diagnostic - PT/mobilize/chair position, up in chair as able  # Acute metabolic encephalopathy - check ABG, BCx as  above  # Hypothermia - check BCx, dx thora if able  Best Practice (right click and "Reselect all SmartList Selections" daily)     Labs   CBC: Recent Labs  Lab 06/24/22 1334 06/25/22 0659 06/27/22 0646  WBC 11.3* 12.3* 11.4*  NEUTROABS 8.9*  --   --   HGB 9.3* 9.1* 8.9*  HCT 30.5* 29.9* 29.6*  MCV 101.7* 102.4* 102.1*  PLT 168 195 0000000    Basic Metabolic Panel: Recent Labs  Lab 06/24/22 1357 06/25/22 0500 06/27/22 0646  NA 131* 132* 129*  K 3.4* 3.5 4.2  CL 94* 95* 92*  CO2 27 25 24   GLUCOSE 119* 112* 157*  BUN 20 24* 26*  CREATININE 4.11* 4.74* 4.70*  CALCIUM 9.0 9.1 9.0  MG  --   --  2.3  PHOS  --   --  4.7*   GFR: Estimated Creatinine Clearance: 13.2 mL/min (A) (by C-G formula based on SCr of 4.7 mg/dL (H)). Recent Labs  Lab 06/24/22 1334 06/25/22 0659 06/27/22 0646 06/27/22 1002  PROCALCITON  --   --  0.86  --   WBC 11.3* 12.3* 11.4*  --   LATICACIDVEN  --   --  1.5 1.4    Liver Function Tests: Recent Labs  Lab 06/24/22 1357 06/27/22 0646  AST 16 14*  ALT <5 <5  ALKPHOS 87 84  BILITOT 0.7 1.0  PROT 6.2* 6.3*  ALBUMIN 2.5* 2.6*   No results for input(s): "LIPASE", "AMYLASE" in the last 168 hours. No results for input(s): "AMMONIA" in the last 168 hours.  ABG    Component Value Date/Time   PHART 7.21 (L) 06/27/2022 0911   PCO2ART 76 (HH) 06/27/2022 0911   PO2ART <31 (LL) 06/27/2022 0911   HCO3 30.4 (H) 06/27/2022 0911   TCO2 26 02/08/2022 0857   ACIDBASEDEF 1.4 06/27/2022 0536   O2SAT 33.7 06/27/2022 0911     Coagulation Profile: No results for input(s): "INR", "PROTIME" in the last 168 hours.  Cardiac Enzymes: No results for input(s): "CKTOTAL", "CKMB", "CKMBINDEX", "TROPONINI" in the last 168 hours.  HbA1C: Hgb A1c MFr Bld  Date/Time Value Ref Range Status  05/25/2022 05:17 AM 5.6 4.8 - 5.6 % Final    Comment:    (NOTE) Pre diabetes:          5.7%-6.4%  Diabetes:              >6.4%  Glycemic control for   <7.0% adults  with diabetes   02/18/2021 03:22 PM 6.2 (H) 4.8 - 5.6 % Final    Comment:    (NOTE) Pre diabetes:          5.7%-6.4%  Diabetes:              >6.4%  Glycemic control for   <7.0%  adults with diabetes     CBG: Recent Labs  Lab 06/27/22 0721 06/27/22 1120 06/27/22 1612 06/27/22 2200 06/28/22 0754  GLUCAP 151* 125* 105* 111* 97       Past Medical History:  He,  has a past medical history of AICD (automatic cardioverter/defibrillator) present, Anemia, Arthritis, Cerebrovascular disease, CHF (congestive heart failure) ([place]), Chronic kidney disease, COVID (2022), Diabetes mellitus, History of TIAs, Hyperlipidemia, Hypertension, Nonischemic cardiomyopathy ([place]), PVD (peripheral vascular disease) ([place]), Shortness of breath dyspnea, Skin disease, Stroke ([place]), and Tobacco abuse.   Surgical History:   Past Surgical History:  Procedure Laterality Date   A/V FISTULAGRAM Left 06/24/2016   Procedure: A/V Fistulagram;  Surgeon: Elam Dutch, MD;  Location: [facility];  Service: Cardiovascular;  Laterality: Left;   A/V FISTULAGRAM N/A 01/17/2017   Procedure: A/V Fistulagram - left arm;  Surgeon: Serafina Mitchell, MD;  Location: [facility];  Service: Cardiovascular;  Laterality: N/A;   AORTIC ARCH ANGIOGRAPHY N/A 10/21/2021   Procedure: AORTIC ARCH ANGIOGRAPHY;  Surgeon: Marty Heck, MD;  Location: [facility];  Service: Cardiovascular;  Laterality: N/A;   AV FISTULA PLACEMENT Left 04/08/2015   Procedure: Creation of Left arm BRACHIOCEPHALIC ARTERIOVENOUS  FISTULA ;  Surgeon: Mal Misty, MD;  Location: [facility];  Service: Vascular;  Laterality: Left;   AV FISTULA PLACEMENT Left 10/27/2020   Procedure: LEFT ARM ARTERIOVENOUS (AV) FISTULA CREATION;  Surgeon: Rosetta Posner, MD;  Location: [facility];  Service: Vascular;  Laterality: Left;   AV FISTULA PLACEMENT Left 06/29/2021   Procedure: INSERTION OF LEFT ARM ARTERIOVENOUS (AV) GORE-TEX GRAFT;  Surgeon: Rosetta Posner, MD;  Location: [facility];  Service: Vascular;  Laterality: Left;   [place] Left 01/12/2021   Procedure: LEFT ARM SECOND STAGE BASILIC VEIN TRANSPOSITION;  Surgeon: Rosetta Posner, MD;  Location: [facility];  Service: Vascular;  Laterality: Left;   CARDIAC DEFIBRILLATOR [place] EXTRACTION W/PHACO Right 03/17/2015   Procedure: CATARACT EXTRACTION PHACO AND INTRAOCULAR LENS PLACEMENT ([place]);  Surgeon: Rutherford Guys, MD;  Location: [facility];  Service: Ophthalmology;  Laterality: Right;  CDE: 6.59   EXCHANGE OF A DIALYSIS CATHETER Right 02/08/2022   Procedure: EXCHANGE OF A TUNNELED DIALYSIS CATHETER;  Surgeon: Rosetta Posner, MD;  Location: [facility];  Service: Vascular;  Laterality: Right;   EYE SURGERY Right    Cataract   IR FLUORO GUIDE CV LINE RIGHT  05/26/2021   IR FLUORO GUIDE CV LINE RIGHT  05/30/2022   IR PTA VENOUS EXCEPT DIALYSIS CIRCUIT  05/26/2021   IR US GUIDE VASC ACCESS RIGHT  05/30/2022   LEG AMPUTATION BELOW KNEE     bilateral   LIGATION ARTERIOVENOUS GORTEX GRAFT Left 09/14/2021   Procedure: LIGATION OF LEFT ARM ARTERIOVENOUS GORTEX GRAFT;  Surgeon: Broadus John, MD;  Location: [facility];  Service: Vascular;  Laterality: Left;  PERIPHERAL NERVE BLOCK   PERIPHERAL VASCULAR INTERVENTION  01/17/2017   Procedure: PERIPHERAL VASCULAR INTERVENTION;  Surgeon: Serafina Mitchell, MD;  Location: [facility];  Service: Cardiovascular;;  PTA  left arm fistula   TEE WITHOUT CARDIOVERSION N/A 05/31/2022   Procedure: TRANSESOPHAGEAL ECHOCARDIOGRAM (TEE);  Surgeon: Donato Heinz, MD;  Location: [facility];  Service: Cardiovascular;  Laterality: N/A;   UPPER EXTREMITY ANGIOGRAPHY N/A 10/21/2021   Procedure: Upper Extremity Angiography;  Surgeon: Marty Heck, MD;  Location: [facility];  Service: Cardiovascular;  Laterality: N/A;   WOUND DEBRIDEMENT Left 02/18/2021   Procedure: LEFT ARM DEBRIDEMENT;  Surgeon:  Serafina Mitchell, MD;  Location[facility];  Service: Vascular;  Laterality: Left;     Social History:   reports that he quit smoking about 24 years ago. His smoking use included cigarettes. He has a 20.00 pack-year smoking history. He has never been exposed to tobacco smoke. He has never used smokeless tobacco. He reports that he does not currently use alcohol. He reports that he does not use drugs.   Family History:  His family history includes Coronary artery disease in an other family member; Diabetes in his brother, brother, brother, brother, father, mother, and another family member; Heart failure in his mother; Stroke in his father.   Allergies Allergies  Allergen Reactions   Contrast Media [Iodinated Contrast Media] Hives and Other (See Comments)    Happened "in the 80's"   Other Other (See Comments)    "Transfer Dye" = "Makes me tired"   Acetazolamide Anxiety and Other (See Comments)    Jittery, odd feeling (hyper feeling)     Home Medications  Prior to Admission medications   Medication Sig Start Date End Date Taking? Authorizing Provider  aspirin 81 MG tablet Take 81 mg by mouth at bedtime.    [provider]  brimonidine (ALPHAGAN) 0.2 % ophthalmic solution Place 1 drop into the right eye 3 (three) times daily.    [provider]  calcium acetate (PHOSLO) 667 MG capsule Take 667-1,334 mg by mouth See admin instructions. Take 1,334 mg by mouth three times a day with meals and 667 mg with each snack 03/25/15   [provider]  carvedilol (COREG) 3.125 MG tablet Take 1 tablet (3.125 mg total) by mouth 2 (two) times daily with a meal. 06/07/22   Thurnell Lose, MD  ceFAZolin (ANCEF) IVPB Inject 2 g into the vein every Monday, Wednesday, and Friday with hemodialysis. Indication:  Staph lugdunensis ICD lead infection + IE First Dose: Yes Last Day of Therapy:  07/08/22 Labs - Once weekly:  CBC/D and BMP, Labs - Every other week:  ESR and CRP Method of  administration: Per HD protocol as will be given at hemodialysis Method of administration may be changed at the discretion of home infusion pharmacist based upon assessment of the patient and/or caregiver's ability to self-administer the medication ordered. 06/07/22 07/12/22  Thurnell Lose, MD  clopidogrel (PLAVIX) 75 MG tablet Take 75 mg by mouth at bedtime.    [provider]  cyanocobalamin 1000 MCG tablet Take 1 tablet (1,000 mcg total) by mouth daily. 06/07/22   Thurnell Lose, MD  docusate sodium (COLACE) 100 MG capsule Take 2 capsules (200 mg total) by mouth 2 (two) times daily. 06/07/22   Thurnell Lose, MD  ferrous sulfate 325 (65 FE) MG tablet Take 1 tablet (325 mg total) by mouth 2 (two) times daily with a meal. 06/07/22   Thurnell Lose, MD  fluticasone (FLONASE) 50 MCG/ACT nasal spray Place 1 spray into both nostrils at bedtime as needed for allergies or rhinitis.    [provider]  folic acid (FOLVITE) 1 MG tablet Take 1 mg by mouth at bedtime.    [provider]  gabapentin (NEURONTIN) 300 MG capsule TAKE 1 CAPSULE(300 MG) BY MOUTH DAILY Patient taking differently: Take 300 mg by mouth at bedtime. 01/07/22   [name], MD  HYDROcodone-acetaminophen (NORCO/VICODIN) 5-325 MG tablet  Take 1 tablet by mouth every 6 (six) hours as needed for moderate pain. Take one tab po q 4 hrs prn pain 06/07/22   Thurnell Lose, MD  icosapent Ethyl (VASCEPA) 1 g capsule Take 2 g by mouth 2 (two) times daily. 04/29/22   [provider]  insulin aspart (NOVOLOG) 100 UNIT/ML FlexPen Before each meal 3 times a day, 140-199 - 2 units, 200-250 - 4 units, 251-299 - 6 units,  300-349 - 8 units,  350 or above 10 units. 06/07/22   Thurnell Lose, MD  insulin glargine-yfgn (SEMGLEE) 100 UNIT/ML injection Inject 0.1 mLs (10 Units total) into the skin at bedtime. 06/07/22   Thurnell Lose, MD  methocarbamol (ROBAXIN) 500 MG tablet Take 1 tablet (500 mg total) by  mouth 3 (three) times daily. Patient not taking: Reported on 05/24/2022 05/16/22   Kem Parkinson, PA-C  pantoprazole (PROTONIX) 40 MG tablet Take 1 tablet (40 mg total) by mouth daily. 06/07/22   Thurnell Lose, MD  rifampin (RIFADIN) 300 MG capsule Take 1 capsule (300 mg total) by mouth 2 (two) times daily. 06/07/22 07/12/22  Thurnell Lose, MD  rosuvastatin (CRESTOR) 20 MG tablet Take 20 mg by mouth at bedtime. 02/24/15   [provider]  triamcinolone cream (KENALOG) 0.1 % Apply 1 Application topically 2 (two) times daily.    [provider]    The patient is critically ill with multiple organ systems failure and requires high complexity decision making for assessment and support, frequent evaluation and titration of therapies, application of advanced monitoring technologies and extensive interpretation of multiple databases. Critical Care Time devoted to patient care services described in this note is 34 minutes.   [place] Cell [phone]   After 7:00 pm call Elink  [phone]

## 2022-06-29 ENCOUNTER — Inpatient Hospital Stay (HOSPITAL_COMMUNITY): Payer: Medicare Other

## 2022-06-29 DIAGNOSIS — J810 Acute pulmonary edema: Secondary | ICD-10-CM

## 2022-06-29 DIAGNOSIS — I5021 Acute systolic (congestive) heart failure: Secondary | ICD-10-CM | POA: Diagnosis not present

## 2022-06-29 DIAGNOSIS — J9601 Acute respiratory failure with hypoxia: Secondary | ICD-10-CM | POA: Diagnosis not present

## 2022-06-29 DIAGNOSIS — J189 Pneumonia, unspecified organism: Secondary | ICD-10-CM

## 2022-06-29 LAB — COMPREHENSIVE METABOLIC PANEL
ALT: 6 U/L (ref 0–44)
AST: 28 U/L (ref 15–41)
Albumin: 2.7 g/dL — ABNORMAL LOW (ref 3.5–5.0)
Alkaline Phosphatase: 81 U/L (ref 38–126)
Anion gap: 16 — ABNORMAL HIGH (ref 5–15)
BUN: 34 mg/dL — ABNORMAL HIGH (ref 8–23)
CO2: 24 mmol/L (ref 22–32)
Calcium: 9.8 mg/dL (ref 8.9–10.3)
Chloride: 92 mmol/L — ABNORMAL LOW (ref 98–111)
Creatinine, Ser: 4.92 mg/dL — ABNORMAL HIGH (ref 0.61–1.24)
GFR, Estimated: 12 mL/min — ABNORMAL LOW (ref >60)
Glucose, Bld: 115 mg/dL — ABNORMAL HIGH (ref 70–99)
Potassium: 4.1 mmol/L (ref 3.5–5.1)
Sodium: 132 mmol/L — ABNORMAL LOW (ref 135–145)
Total Bilirubin: 0.8 mg/dL (ref 0.3–1.2)
Total Protein: 6.3 g/dL — ABNORMAL LOW (ref 6.5–8.1)

## 2022-06-29 LAB — PHOSPHORUS: Phosphorus: 4.9 mg/dL — ABNORMAL HIGH (ref 2.5–4.6)

## 2022-06-29 LAB — CBC
HCT: 28 % — ABNORMAL LOW (ref 39.0–52.0)
Hemoglobin: 8.6 g/dL — ABNORMAL LOW (ref 13.0–17.0)
MCH: 30.7 pg (ref 26.0–34.0)
MCHC: 30.7 g/dL (ref 30.0–36.0)
MCV: 100 fL (ref 80.0–100.0)
Platelets: 175 10*3/uL (ref 150–400)
RBC: 2.8 MIL/uL — ABNORMAL LOW (ref 4.22–5.81)
RDW: 17.1 % — ABNORMAL HIGH (ref 11.5–15.5)
WBC: 9.4 10*3/uL (ref 4.0–10.5)
nRBC: 0.2 % (ref 0.0–0.2)

## 2022-06-29 LAB — TROPONIN I (HIGH SENSITIVITY)
Troponin I (High Sensitivity): 185 ng/L (ref <18)
Troponin I (High Sensitivity): 186 ng/L (ref <18)

## 2022-06-29 LAB — GLUCOSE, CAPILLARY
Glucose-Capillary: 123 mg/dL — ABNORMAL HIGH (ref 70–99)
Glucose-Capillary: 170 mg/dL — ABNORMAL HIGH (ref 70–99)
Glucose-Capillary: 111 mg/dL — ABNORMAL HIGH (ref 70–99)
Glucose-Capillary: 116 mg/dL — ABNORMAL HIGH (ref 70–99)

## 2022-06-29 LAB — MAGNESIUM: Magnesium: 2.3 mg/dL (ref 1.7–2.4)

## 2022-06-29 MED ORDER — OLANZAPINE 10 MG IM SOLR
2.5000 mg | Freq: Once | INTRAMUSCULAR | Status: AC | PRN
  Administered 2022-06-29: 2.5 mg via INTRAMUSCULAR
  Filled 2022-06-29: qty 10

## 2022-06-29 MED ORDER — ALBUTEROL SULFATE (2.5 MG/3ML) 0.083% IN NEBU
2.5000 mg | INHALATION_SOLUTION | RESPIRATORY_TRACT | Status: DC | PRN
  Administered 2022-06-29: 2.5 mg via RESPIRATORY_TRACT
  Filled 2022-06-29: qty 3

## 2022-06-29 MED ORDER — MIDODRINE HCL 5 MG PO TABS
5.0000 mg | ORAL_TABLET | Freq: Three times a day (TID) | ORAL | Status: DC
  Administered 2022-06-30 – 2022-07-05 (×13): 5 mg via ORAL
  Filled 2022-06-29 (×14): qty 1

## 2022-06-29 MED ORDER — IPRATROPIUM-ALBUTEROL 0.5-2.5 (3) MG/3ML IN SOLN
3.0000 mL | RESPIRATORY_TRACT | Status: DC
  Administered 2022-06-29 – 2022-06-30 (×11): 3 mL via RESPIRATORY_TRACT
  Filled 2022-06-29 (×11): qty 3

## 2022-06-29 MED ORDER — SODIUM CHLORIDE 0.9 % IV SOLN
1.0000 g | INTRAVENOUS | Status: DC
  Administered 2022-06-29 – 2022-07-04 (×6): 1 g via INTRAVENOUS
  Filled 2022-06-29 (×7): qty 10

## 2022-06-29 MED ORDER — DEXMEDETOMIDINE HCL IN NACL 400 MCG/100ML IV SOLN
0.0000 ug/kg/h | INTRAVENOUS | Status: DC
  Administered 2022-06-29: 0.4 ug/kg/h via INTRAVENOUS
  Filled 2022-06-29: qty 100

## 2022-06-29 MED ORDER — STERILE WATER FOR INJECTION IJ SOLN
INTRAMUSCULAR | Status: AC
  Administered 2022-06-29: 2 mL
  Filled 2022-06-29: qty 10

## 2022-06-29 NOTE — Progress Notes (Signed)
Pharmacy Antibiotic Note  [name]. is a 67 y.o. male admitted on 06/24/2022 with hospital acquired pneumonia and previous bacteremia.  Pharmacy has been consulted for cefepime dosing. Patient has previous history of pan sensitive S. Lugdunesis bacteremia on 2/15. At that time, patient had HD catheter and AICD device. The patient underwent tunneled cath removal by IR on 2/23 with new tunneled right IJ dialysis cath placed on 2/26 after line holiday. 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 Patient was initiated on cefazolin and rifampin for previous bacteremia with end date to be 4/05.  Patient was transferred to [facility] for ICU care due to respiratory failure and hypoxia with complaints of shortness of breath. Pharmacy consulted to dose cefepime and vancomycin in addition to rifampin for AICD infection. WBC 9s, PCT 0.86>1.84, lactate 1.4, and patient has remained afebrile. CXR showed worsening R side effusions worsening and MRSA PCR negative. Currently on HD MWF  Plan: Start Cefepime 1g IV q24h x7 days and d/c cefazolin for better gram negative coverage and staph lugdunesis Rifampin 300mg  PO BID per MD Monitor for signs of clinical improvement, WBC, fever trend, and any culture data   Height: 4\' 3"  (129.5 cm) Weight: 102.9 kg (226 lb 13.7 oz) IBW/kg (Calculated) : 29.3  Temp (24hrs), Avg:97.5 F (36.4 C), Min:96.4 F (35.8 C), Max:98.4 F (36.9 C)  Recent Labs  Lab 06/24/22 1334 06/24/22 1357 06/25/22 0500 06/25/22 0659 06/27/22 0646 06/27/22 1002 06/28/22 1220 06/29/22 0844  WBC 11.3*  --   --  12.3* 11.4*  --  9.7 9.4  CREATININE  --  4.11* 4.74*  --  4.70*  --  4.02*  --   LATICACIDVEN  --   --   --   --  1.5 1.4  --   --     Estimated Creatinine Clearance: 14.4 mL/min (A) (by C-G formula based on SCr of 4.02 mg/dL (H)).    Allergies  Allergen Reactions   Contrast Media [Iodinated Contrast Media]  Hives and Other (See Comments)    Happened "in the 80's"   Other Other (See Comments)    "Transfer Dye" = "Makes me tired"   Acetazolamide Anxiety and Other (See Comments)    Jittery, odd feeling (hyper feeling)    Antimicrobials this admission: Cefazolin 2/21 >> 3/27 Rifampin 3/1 >>  Cefepime 3/27>>  Microbiology results: 2/15 Bcx from [place]: pan sensitive staph lugdunesis  2/21 Bcx: NGTD 3/26 Bcx: sent  3/26 MRSA PCR: negative  Thank you for allowing pharmacy to be a part of this patient's care.  Sandford Craze, PharmD. [facility] Acute Care PGY-1  06/29/2022 11:20 AM

## 2022-06-29 NOTE — Progress Notes (Signed)
[place] Progress Note Patient Name: [name]. DOB: June 10, 1955 MRN: [id number]   Date of Service  06/29/2022  HPI/Events of Note  Patient with intermittent agitated delirium.  eICU Interventions  Zyprexa 2.5 mg IM x 1 PRN ordered to be given only if significant agitation recurs.        Sonali Wivell U Traye Bates 06/29/2022, 1:24 AM

## 2022-06-29 NOTE — Progress Notes (Signed)
   06/29/22 1832  Vitals  Temp 98 F (36.7 C)  Pulse Rate 78  Resp 13  BP (!) 154/64  SpO2 100 %  Oxygen Therapy  Patient Activity (if Appropriate) In bed  Pulse Oximetry Type Continuous  Post Treatment  Dialyzer Clearance Clear  Duration of HD Treatment -hour(s) 3.75 hour(s)  Hemodialysis Intake (mL) 0 mL  Liters Processed 90  Fluid Removed (mL) 3500 mL  Tolerated HD Treatment Yes   Received patient in bed to unit.  Alert and oriented.  Informed consent signed and in chart.   TX duration: 3.75hrs  Patient tolerated well.  Transported back to the room  Alert, without acute distress.  Hand-off given to patient's nurse.   Access used:RDC Access issues:  none  Total UF removed: 3.5L Medication(s) given: none    Na'Shaminy T Layloni [facility]

## 2022-06-29 NOTE — Progress Notes (Signed)
Pt receives out-pt HD at [facility] on MWF. Appears pt admitted from snf per chart review. Will assist as needed.   Melven Sartorius Renal Navigator [phone]

## 2022-06-29 NOTE — Progress Notes (Signed)
[place] Progress Note Patient Name: [name]. DOB: 07/13/1955 MRN: [id number]   Date of Service  06/29/2022  HPI/Events of Note  Patient restless and continually pulling off his BIPAP.  eICU Interventions  Will trial low dose Precedex gtt as a means of trying to improve his compliance with BIPAP.        Kaydin Labo U Rondalyn Belford 06/29/2022, 3:30 AM

## 2022-06-29 NOTE — Progress Notes (Signed)
Patient ID: [name] Pick., male   DOB: 12-12-1955, 67 y.o.   MRN: [id number] [facility] Progress Note   Assessment/ Plan:   1.  Acute hypoxic respiratory failure: Suspected to be from a combination of volume overload (predominantly) and right lung pneumonia.  With chronic hypotension that has been limiting to ultrafiltration with hemodialysis however, tolerated hemodialysis on 3/25 with ultrafiltration of 2.9 L.  On schedule for hemodialysis again today-if unable to tolerate, will consider CRRT. 2. ESRD: Usually on a Monday/Wednesday/Friday schedule at [facility] with plans to undertake hemodialysis today for additional volume unloading. 3. Anemia: Continue ESA for management of anemia of chronic disease.  No overt loss noted. 4. CKD-MBD: Calcium and phosphorus levels both at goal without phosphorus binders.  Will continue to follow trends. 5. Nutrition: Continue renal diet with fluid restriction. 6. Hypertension: Blood pressure "soft" while on midodrine, monitor with hemodialysis and efforts at volume unloading/ultrafiltration.  Subjective:   Reports to be still having shortness of breath and feeling fatigued.  Somewhat disoriented with regards to time.   Objective:   BP (!) 107/42   Pulse 68   Temp 97.6 F (36.4 C) (Axillary)   Resp 14   Ht 4\' 3"  (1.295 m)   Wt 112.9 kg   SpO2 100%   BMI 67.28 kg/m   Physical Exam: Gen: Appears uncomfortable sitting up in bed, on oxygen via nasal cannula.  Getting ready to eat breakfast CVS: Pulse regular rhythm, normal rate, S1 and S2 normal Resp: Coarse breath sounds bilaterally with intermittent expiratory rhonchi.  Right IJ TDC Abd: Soft, obese, nontender, bowel sounds normal Ext: Status post bilateral AKA  Labs: BMET Recent Labs  Lab 06/24/22 1357 06/25/22 0500 06/27/22 0646 06/28/22 1220 06/28/22 2005  NA 131* 132* 129* 131* 132*  K 3.4* 3.5 4.2 3.6 3.8  CL 94* 95* 92* 94*  --   CO2 27 25 24 25   --    GLUCOSE 119* 112* 157* 117*  --   BUN 20 24* 26* 25*  --   CREATININE 4.11* 4.74* 4.70* 4.02*  --   CALCIUM 9.0 9.1 9.0 9.0  --   PHOS  --   --  4.7*  --   --    CBC Recent Labs  Lab 06/24/22 1334 06/25/22 0659 06/27/22 0646 06/28/22 1220 06/28/22 2005  WBC 11.3* 12.3* 11.4* 9.7  --   NEUTROABS 8.9*  --   --   --   --   HGB 9.3* 9.1* 8.9* 7.9* 9.2*  HCT 30.5* 29.9* 29.6* 25.9* 27.0*  MCV 101.7* 102.4* 102.1* 102.4*  --   PLT 168 195 175 143*  --       Medications:     carvedilol  3.125 mg Oral BID WC   Chlorhexidine Gluconate Cloth  6 each Topical Daily   Chlorhexidine Gluconate Cloth  6 each Topical Q0600   clopidogrel  75 mg Oral QHS   darbepoetin (ARANESP) injection - DIALYSIS  150 mcg Subcutaneous Q Wed-1800   diclofenac Sodium  2 g Topical QID   gabapentin  300 mg Oral QHS   heparin  5,000 Units Subcutaneous Q8H   insulin aspart  0-5 Units Subcutaneous QHS   insulin aspart  0-9 Units Subcutaneous TID WC   ipratropium-albuterol  3 mL Nebulization Q4H   melatonin  3 mg Oral QHS   midodrine  10 mg Oral TID WC   pantoprazole  40 mg Oral Daily   rifampin  300 mg  Oral BID   Elmarie Shiley, MD 06/29/2022, 8:23 AM

## 2022-06-29 NOTE — Progress Notes (Signed)
NAME:  [name] Fieser., MRN:  [id number], DOB:  Dec 24, 1955, LOS: 3 ADMISSION DATE:  06/24/2022, CONSULTATION DATE:  06/27/2022 REFERRING MD:  Eliseo Squires [facility] CHIEF COMPLAINT: Acute respiratory distress    History of Present Illness:  67 yobm remote smoker HD pt with endocarditis/ Aortic insufficiency admtted p unabel to complete HD 3/22 with evidence of vol overloadd / bilateral pl effusions on admit > bipap dependent resp failure and PCCM service requested eval am 3/25.    67 yobm with medical history significant of recent hospitalization with bacteremia,  ESRD on HD, DM.  Patient was brought in from [facility] with c/o SOB.  He had over 1/2 of his HD remaining as he only completed 1 of his 4 hours on 3/22.  Patient denied chest pain, denies fevers, denies chills.  Patient reported he had fallen asleep and woke up coughing and was sent to the ER.  O2 sats were 95% on RA.     In the ER, x ray showed volume overload, elevated BNP, and low K.  ER doc spoke with renal MD who suggested admission and to finish his HD session  Pertinent Medical History:   Past Medical History:  Diagnosis Date   AICD (automatic cardioverter/defibrillator) present    Anemia    Arthritis    Cerebrovascular disease    CHF (congestive heart failure) (HCC)    Chronic kidney disease    ESRD, MWF HD   COVID 2022   moderate   Diabetes mellitus    type II   History of TIAs    Hyperlipidemia    Hypertension    Nonischemic cardiomyopathy (HCC)    PVD (peripheral vascular disease) (HCC)    s/p B BKA   Shortness of breath dyspnea    with exertion    Skin disease    Rare   Stroke ([place])    "light stroke"   Tobacco abuse    Significant Hospital Events: Including procedures, antibiotic start and stop dates in addition to other pertinent events   Viral Panel 06/24/22 neg  Rocephin/Rifampin pre admit and continued 3/22 >>> MRSA PCR 3/23 neg  CTA 3/24 c/w CHF with edema and R > L effusions  Echo 3/25 >> LVEF 30-35%,  moderate decreased LV function, +RWMAs, moderate LV dilation, no LV thrombus, normal RV function, trivial MR, mild AR  Interim History / Subjective:  Transferred from [facility] 3/26PM for higher level of care SOB, briefly tolerated BiPAP overnight but became agitated/claustrophobic Precedex started Satting well on 5LNC Feels dyspnea is better Still feels volume up, guesses dry weight is ~252lb HD planned for today per Nephro PCT up, will begin antibiotics (cefepime/rifampin)  Objective:  Blood pressure 112/61, pulse 60, temperature 97.6 F (36.4 C), temperature source Axillary, resp. rate (!) 9, height 4\' 3"  (1.295 m), weight 112.9 kg, SpO2 100 %.    Vent Mode: PCV;BIPAP FiO2 (%):  [35 %-40 %] 40 % Set Rate:  [12 bmp] 12 bmp PEEP:  [6 cmH20] 6 cmH20 Pressure Support:  [8 cmH20] 8 cmH20   Intake/Output Summary (Last 24 hours) at 06/29/2022 0742 Last data filed at 06/29/2022 0400 Gross per 24 hour  Intake 1.79 ml  Output --  Net 1.79 ml    Filed Weights   06/25/22 1330 06/27/22 1415 06/27/22 1830  Weight: 115.1 kg 115.8 kg 112.9 kg   Physical Examination: General: Chronically ill-appearing older man in NAD. Appears mildly uncomfortable. HEENT: [place]/AT, anicteric sclera, PERRL, moist mucous membranes. Neuro: Awake,  oriented x 4. Responds to verbal stimuli. Following commands consistently. Moves all 4 extremities spontaneously. Generalized weakness. CV: RRR, no m/g/r. PULM: Breathing mildly tachypneic and mildly labored on 4-5LNC. Lung fields diminished bilaterally with faint expiratory wheeze, L > R. GI: Soft, nontender, nondistended. Normoactive bowel sounds. Extremities: Bilateral symmetric 2+ LE edema noted to thighs (bilateral AKA). Old left AVF clotted off, no thrill. Skin: Warm/dry, slightly raised, mildly erythematous scaling rash to L arm, c/w psoriasis.  Assessment & Plan:  Acute on chronic hypercapnic, acute hypoxic respiratory failure Suspected OHS/OSA - Continue  supplemental O2 support - BiPAP PRN + QHS as tolerated - Wean FiO2 for O2 sat > 88% - Bronchodilators as indicated - Pulmonary hygiene - Follow CXR, blood gases - Hold on IR thoracentesis today, plan to reassess respiratory status post-HD  Acute on chronic systolic heart failure  Echo 3/25 with LVEF 30-35%, moderate decreased LV function, +RWMAs, moderate LV dilation, no LV thrombus, normal RV function, trivial MR, mild AR - Volume removal with iHD as tolerated - Monitor I&Os  Acute metabolic encephalopathy vs. delirium - Correct metabolic derangements - Precedex as needed for agitation - Maintain sleep/wake cycles as able  Hypothermia - Trend WBC, fever curve, PCT - F/u Cx data - Continue broad-spectrum antibiotics (cefepime/rifampin)  ESRD on HD - Nephro following, appreciate assistance - iHD per Nephro, as tolerated, needs volume removal - If unable to tolerate due to BP, consider CRRT (has HD cath in place) - Trend BMP - Replete electrolytes as indicated - Monitor I&Os - Avoid nephrotoxic agents as able  Best Practice (right click and "Reselect all SmartList Selections" daily)  Diet/type: Regular consistency (see orders) DVT prophylaxis: SCDs, SQH GI prophylaxis: PPI Central venous access:  Yes, and it is still needed - HD catheter Foley:  N/A Code Status:  DNR Last date of multidisciplinary goals of care discussion [Pending]  Critical care time: 36 minutes   The patient is critically ill with multiple organ system failure and requires high complexity decision making for assessment and support, frequent evaluation and titration of therapies, advanced monitoring, review of radiographic studies and interpretation of complex data.   Critical Care Time devoted to patient care services, exclusive of separately billable procedures, described in this note is 36 minutes.  Lestine Mount, PA-C [facility] 06/29/22 7:43 AM  Please see [email or web address]  for pager details.  From 7A-7P if no response, please call [phone] After hours, please call ELink [phone]

## 2022-06-29 NOTE — Progress Notes (Signed)
Patient having mild increased work of breathing at this time. Patient requested to be placed on bipap.

## 2022-06-30 ENCOUNTER — Other Ambulatory Visit (HOSPITAL_COMMUNITY): Payer: Self-pay

## 2022-06-30 DIAGNOSIS — J189 Pneumonia, unspecified organism: Secondary | ICD-10-CM

## 2022-06-30 DIAGNOSIS — J90 Pleural effusion, not elsewhere classified: Secondary | ICD-10-CM | POA: Diagnosis not present

## 2022-06-30 DIAGNOSIS — J810 Acute pulmonary edema: Secondary | ICD-10-CM

## 2022-06-30 DIAGNOSIS — J9601 Acute respiratory failure with hypoxia: Secondary | ICD-10-CM | POA: Diagnosis not present

## 2022-06-30 DIAGNOSIS — N186 End stage renal disease: Secondary | ICD-10-CM | POA: Diagnosis not present

## 2022-06-30 LAB — RENAL FUNCTION PANEL
Albumin: 2.6 g/dL — ABNORMAL LOW (ref 3.5–5.0)
Albumin: 2.7 g/dL — ABNORMAL LOW (ref 3.5–5.0)
Anion gap: 14 (ref 5–15)
Anion gap: 14 (ref 5–15)
BUN: 22 mg/dL (ref 8–23)
BUN: 14 mg/dL (ref 8–23)
CO2: 22 mmol/L (ref 22–32)
CO2: 28 mmol/L (ref 22–32)
Calcium: 8.9 mg/dL (ref 8.9–10.3)
Calcium: 9.1 mg/dL (ref 8.9–10.3)
Chloride: 95 mmol/L — ABNORMAL LOW (ref 98–111)
Chloride: 90 mmol/L — ABNORMAL LOW (ref 98–111)
Creatinine, Ser: 3.65 mg/dL — ABNORMAL HIGH (ref 0.61–1.24)
Creatinine, Ser: 2.27 mg/dL — ABNORMAL HIGH (ref 0.61–1.24)
GFR, Estimated: 17 mL/min — ABNORMAL LOW (ref >60)
GFR, Estimated: 30 mL/min — ABNORMAL LOW (ref >60)
Glucose, Bld: 147 mg/dL — ABNORMAL HIGH (ref 70–99)
Glucose, Bld: 116 mg/dL — ABNORMAL HIGH (ref 70–99)
Phosphorus: 3.6 mg/dL (ref 2.5–4.6)
Phosphorus: 2.3 mg/dL — ABNORMAL LOW (ref 2.5–4.6)
Potassium: 3.7 mmol/L (ref 3.5–5.1)
Potassium: 3.4 mmol/L — ABNORMAL LOW (ref 3.5–5.1)
Sodium: 131 mmol/L — ABNORMAL LOW (ref 135–145)
Sodium: 132 mmol/L — ABNORMAL LOW (ref 135–145)

## 2022-06-30 LAB — BLOOD GAS, VENOUS
Acid-Base Excess: 2.8 mmol/L — ABNORMAL HIGH (ref 0.0–2.0)
Bicarbonate: 31.5 mmol/L — ABNORMAL HIGH (ref 20.0–28.0)
O2 Saturation: 40.8 %
Patient temperature: 37
pCO2, Ven: 67 mmHg — ABNORMAL HIGH (ref 44–60)
pH, Ven: 7.28 (ref 7.25–7.43)
pO2, Ven: <31 mmHg — CL (ref 32–45)

## 2022-06-30 LAB — CBC
HCT: 26.7 % — ABNORMAL LOW (ref 39.0–52.0)
Hemoglobin: 8.5 g/dL — ABNORMAL LOW (ref 13.0–17.0)
MCH: 31.3 pg (ref 26.0–34.0)
MCHC: 31.8 g/dL (ref 30.0–36.0)
MCV: 98.2 fL (ref 80.0–100.0)
Platelets: 186 10*3/uL (ref 150–400)
RBC: 2.72 MIL/uL — ABNORMAL LOW (ref 4.22–5.81)
RDW: 17.1 % — ABNORMAL HIGH (ref 11.5–15.5)
WBC: 11.9 10*3/uL — ABNORMAL HIGH (ref 4.0–10.5)
nRBC: 0.2 % (ref 0.0–0.2)

## 2022-06-30 LAB — MAGNESIUM: Magnesium: 2 mg/dL (ref 1.7–2.4)

## 2022-06-30 LAB — GLUCOSE, CAPILLARY
Glucose-Capillary: 163 mg/dL — ABNORMAL HIGH (ref 70–99)
Glucose-Capillary: 133 mg/dL — ABNORMAL HIGH (ref 70–99)
Glucose-Capillary: 106 mg/dL — ABNORMAL HIGH (ref 70–99)
Glucose-Capillary: 154 mg/dL — ABNORMAL HIGH (ref 70–99)

## 2022-06-30 MED ORDER — OXYCODONE HCL 5 MG PO TABS
5.0000 mg | ORAL_TABLET | Freq: Four times a day (QID) | ORAL | Status: DC | PRN
  Administered 2022-06-30 – 2022-07-05 (×4): 5 mg via ORAL
  Filled 2022-06-30 (×5): qty 1

## 2022-06-30 MED ORDER — SORBITOL 70 % SOLN
15.0000 mL | Freq: Every day | Status: DC
  Administered 2022-06-30 – 2022-07-02 (×3): 15 mL via ORAL
  Filled 2022-06-30 (×3): qty 30

## 2022-06-30 MED ORDER — HEPARIN SODIUM (PORCINE) 1000 UNIT/ML IJ SOLN
INTRAMUSCULAR | Status: AC
  Administered 2022-06-30: 1000 [IU]
  Filled 2022-06-30: qty 4

## 2022-06-30 NOTE — Progress Notes (Signed)
VBG delayed> told that pt would have to be off iHD for 2hrs before lab could run.  Mental status unchanged from am, remains confused and somnolent.   Otherwise remains hemodynamically stable VBG 7.28/ 67    P:  - BiPAP now, suspect will need ongoing prn and would benefit from qHS till further volume is removed.  - hold transfer out of ICU till more awake       Kennieth Rad, MSN, AG-ACNP-BC [facility] 06/30/2022, 5:20 PM  See Amion for pager If no response to pager, please call PCCM consult pager After 7:00 pm call Elink

## 2022-06-30 NOTE — Progress Notes (Addendum)
Received patient in bed in ICU Confused. Informed consent signed and in chart.   TX duration:3.5 h.Pt had hypotension. BP 82/68 at last 5 mins.  Patient tolerated well.  without acute distress.  Hand-off given to patient's nurse.   Access used: catheter. Access issues: none  Total UF removed: 3 L Medication(s) given: Midodrine 5 mg PO Post HD VS: 120/54 P 82 R 17 Post HD weight: 100 Kg   [facility]

## 2022-06-30 NOTE — Progress Notes (Signed)
NAME:  [name]., MRN:  [id number], DOB:  [birth year], LOS: 4 ADMISSION DATE:  06/24/2022, CONSULTATION DATE:  06/27/2022 REFERRING MD:  Eliseo Squires [facility] CHIEF COMPLAINT: Acute respiratory distress    History of Present Illness:  67 yobm remote smoker HD pt with endocarditis/ Aortic insufficiency admtted p unabel to complete HD 3/22 with evidence of vol overloadd / bilateral pl effusions on admit > bipap dependent resp failure and PCCM service requested eval am 3/25.    67 yobm with medical history significant of recent hospitalization with bacteremia,  ESRD on HD, DM.  Patient was brought in from [facility] with c/o SOB.  He had over 1/2 of his HD remaining as he only completed 1 of his 4 hours on 3/22.  Patient denied chest pain, denies fevers, denies chills.  Patient reported he had fallen asleep and woke up coughing and was sent to the ER.  O2 sats were 95% on RA.     In the ER, x ray showed volume overload, elevated BNP, and low K.  ER doc spoke with renal MD who suggested admission and to finish his HD session  Pertinent Medical History:   Past Medical History:  Diagnosis Date   AICD (automatic cardioverter/defibrillator) present    Anemia    Arthritis    Cerebrovascular disease    CHF (congestive heart failure) (HCC)    Chronic kidney disease    ESRD, MWF HD   COVID 2022   moderate   Diabetes mellitus    type II   History of TIAs    Hyperlipidemia    Hypertension    Nonischemic cardiomyopathy (HCC)    PVD (peripheral vascular disease) (HCC)    s/p B BKA   Shortness of breath dyspnea    with exertion    Skin disease    Rare   Stroke ([place])    "light stroke"   Tobacco abuse    Significant Hospital Events: Including procedures, antibiotic start and stop dates in addition to other pertinent events   Viral Panel 06/24/22 neg  Rocephin/Rifampin pre admit and continued 3/22 >>> MRSA PCR 3/23 neg  CTA 3/24 c/w CHF with edema and R > L effusions  Echo 3/25 >> LVEF 30-35%,  moderate decreased LV function, +RWMAs, moderate LV dilation, no LV thrombus, normal RV function, trivial MR, mild AR 3/26 tx from [facility], intermittent BiPAP, iHD, abx, precedex 3/27 iHD 3.5L off  Interim History / Subjective:  iHD yesterday 3.5L off  Afebrile More confused overnight, not sleeping well.  Had complained of nausea, resolved with zofran and some knee pain, resolved without intervention.   On iHD, tolerating well so far, plans for 3L UF  Objective:  Blood pressure (!) 124/55, pulse 75, temperature 97.7 F (36.5 C), temperature source Oral, resp. rate 10, height 4\' 3"  (1.295 m), weight 103 kg, SpO2 100 %.    Vent Mode: PCV;BIPAP FiO2 (%):  [28 %-40 %] 28 % Set Rate:  [12 bmp] 12 bmp PEEP:  [6 cmH20] 6 cmH20   Intake/Output Summary (Last 24 hours) at 06/30/2022 1121 Last data filed at 06/29/2022 1832 Gross per 24 hour  Intake --  Output 3500 ml  Net -3500 ml   Filed Weights   06/27/22 1830 06/29/22 0800 06/30/22 1034  Weight: 112.9 kg 102.9 kg 103 kg   Physical Examination: General:  chronically ill appearing elderly male sitting upright in bed in NAD HEENT: MM pink/moist, pupils 4/reactive Neuro: Awake, confused, oriented to person  CV: rr, NSR, R TDC accessed  PULM:  non labored, diminished, few scattered bibasilar rales, down to 1L at 99% GI: obese, +bs, NT Extremities: warm/dry, B BKA, +1- 2 BLE edema   Net -7.2L Wts 102.9> 103 Labs reviewed> Na 131, K 3.7, WBC 9.4> 11.9 Afebrile  Assessment & Plan:  Acute on chronic hypercapnic, acute hypoxic respiratory failure Suspected OHS/OSA Pleural effusions, R> L - given mental status, check VBG to rule out hypercarbia.  - cont to wean supplemental O2 for sat goal > 88%.  Weaning O2 support after volume removal  - BiPAP prn and qHS - cont BD, prn albuterol - bronchial hygiene as tolerated, IS - volume removal per iHD - CXR prn/ monitor effusions   Acute on chronic systolic heart failure  Echo 3/25 with LVEF  30-35%, moderate decreased LV function, +RWMAs, moderate LV dilation, no LV thrombus, normal RV function, trivial MR, mild AR - cont volume removal per iHD - strict I/Os, daily wts.  Unclear dry wt.  - decreasing midodrine, hold on restarting GDMT/ pta meds for now  Acute metabolic encephalopathy vs. suspected delirium - Sleepy poorly despite melatonin, possibly contributing to his encephalopathy.  Pending VBG to rule out hypercarbia vs underlying infectious  - remains off precedex > 24hrs  - delirium precautions - ongoing correction of metabolic derangements - try to minimize sleeping interruptions   Hypothermia Hypotension, undifferentiated, probable sepsis Hx recent [place] bacteremia, possible aortic abscess, AICD vegetation, TV IE - did not require vasopressors other than midodrine.  BP getting better, temp stable.  Decrease midodrine 10> 5mg  TID, if even stop - Trend WBC, fever curve> remains reassuring - trend PCT  - continue cefepime and rifampin.  When able to de-escalate, will need to go back on ancef till 07/08/22 - following cultures, ngtd  ESRD on HD Chronic hyponatremia - Nephro following, appreciate assistance - pt guessed dry weight 252 - iHD per Nephro, goal additional 3L today  - Trend BMP - Replete electrolytes as indicated - Monitor I&Os - Avoid nephrotoxic agents as able   If remains hemodynamically stable after iHD, consider transfer out of ICU later today  Best Practice (right click and "Reselect all SmartList Selections" daily)  Diet/type: Regular consistency (see orders)> if mental status will tolerate DVT prophylaxis: SCDs, SQH GI prophylaxis: PPI Central venous access:  Yes, and it is still needed - HD catheter Foley:  N/A Code Status:  DNR/ DNI Last date of multidisciplinary goals of care discussion [Pending]   Critical care time: 35 minutes   The patient is critically ill with multiple organ system failure and requires high complexity  decision making for assessment and support, frequent evaluation and titration of therapies, advanced monitoring, review of radiographic studies and interpretation of complex data.   Critical Care Time devoted to patient care services, exclusive of separately billable procedures, described in this note is 36 minutes.  Kennieth Rad, NP [facility] 06/30/22 11:21 AM  Please see [email or web address] for pager details.  From 7A-7P if no response, please call [phone] After hours, please call ELink [phone]

## 2022-06-30 NOTE — Progress Notes (Signed)
Patient ID: [name] Pick., male   DOB: 12-06-1955, 67 y.o.   MRN: [id number] [facility] Progress Note   Assessment/ Plan:   1.  Acute hypoxic respiratory failure: Suspected to be from a combination of volume overload (predominantly) and right lung pneumonia.  Records reviewed indicate hypotension has been limiting to ultrafiltration/attaining dry weight as an outpatient.  He had hemodialysis yesterday with 3.5 L ultrafiltration however, continued dyspnea overnight requiring BiPAP; will order for additional hemodialysis today for additional volume unloading. 2. ESRD: Usually on a Monday/Wednesday/Friday schedule at [facility] with plans to undertake hemodialysis again today for ultrafiltration. 3. Anemia: Continue ESA for management of anemia of chronic disease.  No overt loss noted. 4. CKD-MBD: Calcium and phosphorus levels both at goal without phosphorus binders.  Will continue to follow trends. 5. Nutrition: Continue renal diet with fluid restriction. 6. Hypertension: Blood pressure "soft" while on midodrine, monitor with hemodialysis and efforts at volume unloading/ultrafiltration.  Subjective:   Reports to be still having shortness of breath and feeling fatigued.  Somewhat disoriented with regards to time.   Objective:   BP 138/74   Pulse 84   Temp 97.8 F (36.6 C) (Oral)   Resp 14   Ht 4\' 3"  (1.295 m)   Wt 102.9 kg   SpO2 100%   BMI 61.32 kg/m   Physical Exam: Gen: Appears uncomfortable sitting up in bed, on oxygen via facemask.  CVS: Pulse regular rhythm, normal rate, S1 and S2 normal Resp: Coarse breath sounds bilaterally with intermittent expiratory rhonchi.  Right IJ TDC Abd: Soft, obese, nontender, bowel sounds normal Ext: Status post bilateral AKA  Labs: BMET Recent Labs  Lab 06/24/22 1357 06/25/22 0500 06/27/22 0646 06/28/22 1220 06/28/22 2005 06/29/22 0844 06/30/22 0145  NA 131* 132* 129* 131* 132* 132* 131*  K 3.4* 3.5 4.2 3.6 3.8  4.1 3.7  CL 94* 95* 92* 94*  --  92* 95*  CO2 27 25 24 25   --  24 22  GLUCOSE 119* 112* 157* 117*  --  115* 147*  BUN 20 24* 26* 25*  --  34* 22  CREATININE 4.11* 4.74* 4.70* 4.02*  --  4.92* 3.65*  CALCIUM 9.0 9.1 9.0 9.0  --  9.8 8.9  PHOS  --   --  4.7*  --   --  4.9* 3.6   CBC Recent Labs  Lab 06/24/22 1334 06/25/22 0659 06/27/22 0646 06/28/22 1220 06/28/22 2005 06/29/22 0844 06/30/22 0145  WBC 11.3*   < > 11.4* 9.7  --  9.4 11.9*  NEUTROABS 8.9*  --   --   --   --   --   --   HGB 9.3*   < > 8.9* 7.9* 9.2* 8.6* 8.5*  HCT 30.5*   < > 29.6* 25.9* 27.0* 28.0* 26.7*  MCV 101.7*   < > 102.1* 102.4*  --  100.0 98.2  PLT 168   < > 175 143*  --  175 186   < > = values in this interval not displayed.      Medications:     Chlorhexidine Gluconate Cloth  6 each Topical Daily   Chlorhexidine Gluconate Cloth  6 each Topical Q0600   clopidogrel  75 mg Oral QHS   darbepoetin (ARANESP) injection - DIALYSIS  150 mcg Subcutaneous Q Wed-1800   diclofenac Sodium  2 g Topical QID   gabapentin  300 mg Oral QHS   heparin  5,000 Units Subcutaneous Q8H  insulin aspart  0-5 Units Subcutaneous QHS   insulin aspart  0-9 Units Subcutaneous TID WC   ipratropium-albuterol  3 mL Nebulization Q4H   melatonin  3 mg Oral QHS   midodrine  5 mg Oral TID WC   pantoprazole  40 mg Oral Daily   rifampin  300 mg Oral BID   Elmarie Shiley, MD 06/30/2022, 8:11 AM

## 2022-07-01 DIAGNOSIS — I5020 Unspecified systolic (congestive) heart failure: Secondary | ICD-10-CM | POA: Diagnosis not present

## 2022-07-01 LAB — LIPID PANEL
Cholesterol: 89 mg/dL (ref 0–200)
HDL: 30 mg/dL — ABNORMAL LOW
LDL Cholesterol: 25 mg/dL (ref 0–99)
Total CHOL/HDL Ratio: 3 ratio
Triglycerides: 168 mg/dL — ABNORMAL HIGH
VLDL: 34 mg/dL (ref 0–40)

## 2022-07-01 LAB — POCT I-STAT 7, (LYTES, BLD GAS, ICA,H+H)
Acid-Base Excess: 2 mmol/L (ref 0.0–2.0)
Bicarbonate: 27.8 mmol/L (ref 20.0–28.0)
Calcium, Ion: 1.24 mmol/L (ref 1.15–1.40)
HCT: 27 % — ABNORMAL LOW (ref 39.0–52.0)
Hemoglobin: 9.2 g/dL — ABNORMAL LOW (ref 13.0–17.0)
O2 Saturation: 99 %
Patient temperature: 98
Potassium: 3.3 mmol/L — ABNORMAL LOW (ref 3.5–5.1)
Sodium: 129 mmol/L — ABNORMAL LOW (ref 135–145)
TCO2: 29 mmol/L (ref 22–32)
pCO2 arterial: 48.2 mmHg — ABNORMAL HIGH (ref 32–48)
pH, Arterial: 7.368 (ref 7.35–7.45)
pO2, Arterial: 137 mmHg — ABNORMAL HIGH (ref 83–108)

## 2022-07-01 LAB — RENAL FUNCTION PANEL
Albumin: 2.5 g/dL — ABNORMAL LOW (ref 3.5–5.0)
Anion gap: 12 (ref 5–15)
BUN: 18 mg/dL (ref 8–23)
CO2: 26 mmol/L (ref 22–32)
Calcium: 9.2 mg/dL (ref 8.9–10.3)
Chloride: 92 mmol/L — ABNORMAL LOW (ref 98–111)
Creatinine, Ser: 3.24 mg/dL — ABNORMAL HIGH (ref 0.61–1.24)
GFR, Estimated: 20 mL/min — ABNORMAL LOW (ref >60)
Glucose, Bld: 107 mg/dL — ABNORMAL HIGH (ref 70–99)
Phosphorus: 3.4 mg/dL (ref 2.5–4.6)
Potassium: 3.5 mmol/L (ref 3.5–5.1)
Sodium: 130 mmol/L — ABNORMAL LOW (ref 135–145)

## 2022-07-01 LAB — GLUCOSE, CAPILLARY
Glucose-Capillary: 106 mg/dL — ABNORMAL HIGH (ref 70–99)
Glucose-Capillary: 160 mg/dL — ABNORMAL HIGH (ref 70–99)
Glucose-Capillary: 124 mg/dL — ABNORMAL HIGH (ref 70–99)
Glucose-Capillary: 100 mg/dL — ABNORMAL HIGH (ref 70–99)

## 2022-07-01 LAB — CBC
HCT: 27.1 % — ABNORMAL LOW (ref 39.0–52.0)
Hemoglobin: 8.5 g/dL — ABNORMAL LOW (ref 13.0–17.0)
MCH: 31.1 pg (ref 26.0–34.0)
MCHC: 31.4 g/dL (ref 30.0–36.0)
MCV: 99.3 fL (ref 80.0–100.0)
Platelets: 149 10*3/uL — ABNORMAL LOW (ref 150–400)
RBC: 2.73 MIL/uL — ABNORMAL LOW (ref 4.22–5.81)
RDW: 17.2 % — ABNORMAL HIGH (ref 11.5–15.5)
WBC: 8.6 10*3/uL (ref 4.0–10.5)
nRBC: 0.5 % — ABNORMAL HIGH (ref 0.0–0.2)

## 2022-07-01 LAB — MAGNESIUM: Magnesium: 2 mg/dL (ref 1.7–2.4)

## 2022-07-01 MED ORDER — HEPARIN SODIUM (PORCINE) 1000 UNIT/ML DIALYSIS: 40.0000 [IU]/kg | INTRAMUSCULAR | Status: DC | PRN

## 2022-07-01 MED ORDER — HEPARIN SODIUM (PORCINE) 1000 UNIT/ML IJ SOLN
INTRAMUSCULAR | Status: AC
  Filled 2022-07-01: qty 4

## 2022-07-01 MED ORDER — DARBEPOETIN ALFA 150 MCG/0.3ML IJ SOSY
150.0000 ug | PREFILLED_SYRINGE | INTRAMUSCULAR | Status: DC
  Administered 2022-07-01: 150 ug via SUBCUTANEOUS
  Filled 2022-07-01: qty 0.3

## 2022-07-01 MED ORDER — IPRATROPIUM-ALBUTEROL 0.5-2.5 (3) MG/3ML IN SOLN
3.0000 mL | Freq: Two times a day (BID) | RESPIRATORY_TRACT | Status: DC
  Administered 2022-07-01 – 2022-07-04 (×8): 3 mL via RESPIRATORY_TRACT
  Filled 2022-07-01 (×9): qty 3

## 2022-07-01 MED ORDER — ATORVASTATIN CALCIUM 40 MG PO TABS
40.0000 mg | ORAL_TABLET | Freq: Every day | ORAL | Status: DC
  Administered 2022-07-01 – 2022-07-05 (×4): 40 mg via ORAL
  Filled 2022-07-01 (×6): qty 1

## 2022-07-01 NOTE — Progress Notes (Signed)
NAME:  [name]., MRN:  [id number], DOB:  1955-07-22, LOS: 5 ADMISSION DATE:  06/24/2022, CONSULTATION DATE:  06/27/2022 REFERRING MD:  Eliseo Squires [facility] CHIEF COMPLAINT: Acute respiratory distress    History of Present Illness:  67 yobm remote smoker HD pt with endocarditis/ Aortic insufficiency admtted p unabel to complete HD 3/22 with evidence of vol overloadd / bilateral pl effusions on admit > bipap dependent resp failure and PCCM service requested eval am 3/25.    67 yobm with medical history significant of recent hospitalization with bacteremia,  ESRD on HD, DM.  [facility] with c/o SOB.  He had over 1/2 of his HD remaining as he only completed 1 of his 4 hours on 3/22.  Patient denied chest pain, denies fevers, denies chills.  Patient reported he had fallen asleep and woke up coughing and was sent to the ER.  O2 sats were 95% on RA.     In the ER, x ray showed volume overload, elevated BNP, and low K.  ER doc spoke with renal MD who suggested admission and to finish his HD session  Pertinent Medical History:   Past Medical History:  Diagnosis Date   AICD (automatic cardioverter/defibrillator) present    Anemia    Arthritis    Cerebrovascular disease    CHF (congestive heart failure) (HCC)    Chronic kidney disease    ESRD, MWF HD   COVID 2022   moderate   Diabetes mellitus    type II   History of TIAs    Hyperlipidemia    Hypertension    Nonischemic cardiomyopathy (HCC)    PVD (peripheral vascular disease) (HCC)    s/p B BKA   Shortness of breath dyspnea    with exertion    Skin disease    Rare   Stroke ([place])    "light stroke"   Tobacco abuse    Significant Hospital Events: Including procedures, antibiotic start and stop dates in addition to other pertinent events   Viral Panel 06/24/22 neg  Rocephin/Rifampin pre admit and continued 3/22 >>> MRSA PCR 3/23 neg  CTA 3/24 c/w CHF with edema and R > L effusions  Echo 3/25 >> LVEF 30-35%,  moderate decreased LV function, +RWMAs, moderate LV dilation, no LV thrombus, normal RV function, trivial MR, mild AR 3/26 tx from [facility], intermittent BiPAP, iHD, abx, precedex 3/27 iHD 3.5L off 3/28 iHD 3L off, hypercarbic again, BiPAP  Interim History / Subjective:  Alert and oriented this morning Only wore bipap for ~3.5 hrs Currently on iHD but wants off because he's tired and weak.  Asking to just skip to Monday> compromised to do  half of treatment with goal 1.5L off instead of 3L  Objective:  Blood pressure 135/71, pulse 75, temperature 97.7 F (36.5 C), temperature source Axillary, resp. rate (!) 25, height 4\' 3"  (1.295 m), weight 89 kg, SpO2 100 %.    Vent Mode: BIPAP;PCV FiO2 (%):  [28 %-40 %] 28 % Set Rate:  [12 bmp] 12 bmp PEEP:  [6 cmH20] 6 cmH20   Intake/Output Summary (Last 24 hours) at 07/01/2022 1033 Last data filed at 06/30/2022 2030 Gross per 24 hour  Intake 310.11 ml  Output 3000 ml  Net -2689.89 ml   Filed Weights   06/30/22 1034 06/30/22 1515 07/01/22 0945  Weight: 103 kg 100 kg 89 kg   Physical Examination: General:  chronically ill appearing obese male sitting upright in bed on  iHD, eating grapes HEENT: MM pink/moist Neuro: Awake, oriented x 3, MAE CV: rr, NSR, R TDC PULM:  non labored, more shallow, slight bibasilar rales  GI: obese, soft, NT, +bs Extremities: warm/dry, B BKA, off left AVF- clotted Skin: scaly rash to left arm  Net -9.9L Wts 102.9> 103> 89 (presumed not accurate) Labs reviewed> Na 130, K 3.5, WBC 11.9> 8.6, plts 149, H/H stable Afebrile  Assessment & Plan:  Acute on chronic hypercapnic, acute hypoxic respiratory failure Suspected OHS/OSA Pleural effusions, R> L Concern for R PNA - hypercarbic yesterday evening, resolved with BiPAP.  Observed apneic episodes while sleeping today.  Needs mandatory BiPAP qHS and with naps/ prn.  Needs outpt sleep study. - O2 prn for sat goal> 88% - needs to work on pulmonary hygiene> IS, OOB  -  ongoing volume removal per iHD - prn CXR - abx as below - will need outpt sleep study  - pt ok with short term intubation if needed, but will consult PMT given his poor compliance with therapies while awake and oriented, and with multiple co-morbidities, concerned for nearing of end-stage disease processes HFrEF, ESRD not tolerating outpt dialysis, and failure to thrive.    >pt not on BiPAP this morning, now confused, attempting to pull off equipment.  Able to redirect and place on BiPAP.  Remains high risk of intubation.  Will leave in ICU.  Continue goal of care discussions.  Spoke with patient's brother who visit later with patient's son, hopeful to continue then  Acute metabolic encephalopathy vs. suspected delirium - suspected some delirium with lack of sleep and hypercarbia contributing  - mental status at baseline - delirium precautions - ongoing correction of metabolic derangements - try to minimize sleeping interruptions   Acute on chronic systolic heart failure  Echo 3/25 with LVEF 30-35%, moderate decreased LV function, +RWMAs, moderate LV dilation, no LV thrombus, normal RV function, trivial MR, mild AR; EF down from 05/2022, was 45-50%.  - cont volume removal per iHD - strict I/Os, daily wts.  - cont midodrine 5mg  TID, hold on restarting GDMT, may not be able to tolerate anymore given worsening EF.   Outpt dialysis has been limited per Renal notes given hypotension. Concerned for progressive/ nearing endstage heart failure  Hypotension, undifferentiated, probable sepsis and worsening HFrEF Hx recent [place] bacteremia, possible aortic abscess, AICD vegetation, TV IE - cont midodrine 5mg  TID - trend WBC/ fever curve  - continue cefepime for total of 7 days then back to ancef.  Cont rifampin till 4/5 per ID.  Will check with ID to see if suppressive therapy needed given suspected AICD vegetation.    - following cultures, ngtd  ESRD on HD, anuric  Chronic hyponatremia -  Nephro following, appreciate assistance - unclear of dry wt - additional iHD today but patient wants off early - defer electrolytes to Renal   Constipation - 3/25 last BM - cont sorbitol  - hopefully OOB w/ PT today   Chronic debility - PT/ OT consult  At risk for malnutrition - poor nutrition since admit, monitor, consider RD consult/ maximize nutrition as able   Best Practice (right click and "Reselect all SmartList Selections" daily)  Diet/type: Regular consistency (see orders)> renal / heart healthy DVT prophylaxis: SCDs, SQH GI prophylaxis: PPI Central venous access:  Yes, and it is still needed - HD catheter Foley:  N/A Code Status:  DNR/ ok with short term intubation  Last date of multidisciplinary goals of care discussion 3/26.  PMT consulted 3/29.   Spoke with pt's brother [name].  He and patient's son planning on visiting this afternoon.  Hopefully will continue [place] discussions then.   Critical care time: 35 minutes   The patient is critically ill with multiple organ system failure and requires high complexity decision making for assessment and support, frequent evaluation and titration of therapies, advanced monitoring, review of radiographic studies and interpretation of complex data.   Critical Care Time devoted to patient care services, exclusive of separately billable procedures, described in this note is 36 minutes.  Kennieth Rad, NP [facility] 07/01/22 10:33 AM  Please see [email or web address] for pager details.  From 7A-7P if no response, please call [phone] After hours, please call ELink [phone]

## 2022-07-01 NOTE — Progress Notes (Signed)
Received patient in bed in ICU Alert. Informed consent signed and in chart.   [place] duration:3h. Pt cut off 30 mins per Dr. Posey Pronto ordered. Pt goal met.  Patient tolerated well. without acute distress.  Hand-off given to patient's nurse.   Access used: catheter Access issues: BFR 300  Total UF removed: 3 L Medication(s) given: Midodrine 5 mg po. Post HD VS: 117/70. P 82. R 20. Post HD weight: 92 Kg   [facility]

## 2022-07-01 NOTE — Progress Notes (Signed)
[facility] Progress Note Patient Name: [name]. DOB: 03-14-1956 MRN: [id number]   Date of Service  07/01/2022  HPI/Events of Note  Based on Dr. Thompson Caul note in the chart, patient's DNR order clarified to specify "No CPR / ACLS protocol" but patient may be intubated, and receive medication and other interventions short of CPR / ACLS protocol.  eICU Interventions  See above.        Frederik Pear 07/01/2022, 10:17 PM

## 2022-07-01 NOTE — Progress Notes (Deleted)
07/01/2022   I have seen and evaluated the patient for resp failure, bacteremia.  S:  No events, ripped off all his tele leads.  Not wearing BIPAP consistently.  Asked HD RN to end HD early.  C/o sacral pain.  Redirectable but poor insight.  O: Blood pressure 117/70, pulse 80, temperature 97.8 F (36.6 C), temperature source Oral, resp. rate (!) 23, height 4\' 3"  (1.295 m), weight 95 kg, SpO2 100 %.  Mildly restless Tracks, answers questions appropriately When left alone he falls asleep and has witnessed apneas with associated desats to 70s every 30s or so Lungs diminished bases Bilateral BKA noted  CBC ok BMP ok No new imaging.  A:  Recurrent hypercarbia, noncompliance with BIPAP resulting in issues with hypercarbic delirium  HFrEF  ESRD on HD  Suspected HCAP  Aortic valve abscess, not surgical candidate  P:  - Abx as ordered for HCAP then back to prolonged cefazolin/rifampin - Encourage BIPAP qHS and PRN sleeping; he is DNR but would still be okay with intubation at present. - iHD with fluid removal as tolerated - Wean midodrine - May need palliative input, will talk with NP  My cc time 30 mins Erskine Emery MD [facility] Prefer epic messenger for cross cover needs If after hours, please call E-link

## 2022-07-01 NOTE — Progress Notes (Signed)
Patient ID: [name] Pick., male   DOB: 04/19/1955, 67 y.o.   MRN: [id number] [facility] Progress Note   Assessment/ Plan:   1.  Acute hypoxic respiratory failure: Suspected to be from a combination of volume overload (predominantly) and right lung pneumonia.  Records reviewed indicate hypotension has been limiting to ultrafiltration/attaining dry weight as an outpatient.  Underwent additional hemodialysis yesterday with net ultrafiltration 3 L and is on schedule for routine dialysis today. 2. ESRD: Usually on a Monday/Wednesday/Friday schedule at [facility] with plans to undertake hemodialysis today to continue/resume his routine schedule.  Will reevaluate him again tomorrow to decide on need for HD versus CRRT.  Net -9.9 L since admission. 3. Anemia: Continue ESA for management of anemia of chronic disease.  No overt loss noted. 4. CKD-MBD: Calcium and phosphorus levels both at goal without phosphorus binders.  Will continue to follow trends. 5. Nutrition: Continue renal diet with fluid restriction. 6. Hypertension: Blood pressure "soft" while on midodrine, monitor with hemodialysis and efforts at volume unloading/ultrafiltration.  Subjective:   Changes in mentation noted yesterday afternoon; tolerated hemodialysis with hypotension at the end.   Objective:   BP (!) 119/49   Pulse 69   Temp 98.3 F (36.8 C) (Oral)   Resp 14   Ht 4\' 3"  (1.295 m)   Wt 100 kg   SpO2 100%   BMI 59.59 kg/m   Physical Exam: Gen: Lethargic, comfortable sitting propped up in bed eating breakfast.  CVS: Pulse regular rhythm, normal rate, S1 and S2 normal Resp: Coarse breath sounds bilaterally with intermittent expiratory rhonchi.  Right IJ TDC Abd: Soft, obese, nontender, bowel sounds normal Ext: Status post bilateral AKA  Labs: BMET Recent Labs  Lab 06/24/22 1357 06/25/22 0500 06/27/22 0646 06/28/22 1220 06/28/22 2005 06/29/22 0844 06/30/22 0145 06/30/22 1229  07/01/22 0008  NA 131* 132* 129* 131* 132* 132* 131* 132* 129*  K 3.4* 3.5 4.2 3.6 3.8 4.1 3.7 3.4* 3.3*  CL 94* 95* 92* 94*  --  92* 95* 90*  --   CO2 27 25 24 25   --  24 22 28   --   GLUCOSE 119* 112* 157* 117*  --  115* 147* 116*  --   BUN 20 24* 26* 25*  --  34* 22 14  --   CREATININE 4.11* 4.74* 4.70* 4.02*  --  4.92* 3.65* 2.27*  --   CALCIUM 9.0 9.1 9.0 9.0  --  9.8 8.9 9.1  --   PHOS  --   --  4.7*  --   --  4.9* 3.6 2.3*  --    CBC Recent Labs  Lab 06/24/22 1334 06/25/22 0659 06/28/22 1220 06/28/22 2005 06/29/22 0844 06/30/22 0145 07/01/22 0008 07/01/22 0645  WBC 11.3*   < > 9.7  --  9.4 11.9*  --  8.6  NEUTROABS 8.9*  --   --   --   --   --   --   --   HGB 9.3*   < > 7.9*   < > 8.6* 8.5* 9.2* 8.5*  HCT 30.5*   < > 25.9*   < > 28.0* 26.7* 27.0* 27.1*  MCV 101.7*   < > 102.4*  --  100.0 98.2  --  99.3  PLT 168   < > 143*  --  175 186  --  149*   < > = values in this interval not displayed.      Medications:  Chlorhexidine Gluconate Cloth  6 each Topical Daily   Chlorhexidine Gluconate Cloth  6 each Topical Q0600   clopidogrel  75 mg Oral QHS   darbepoetin (ARANESP) injection - DIALYSIS  150 mcg Subcutaneous Q Wed-1800   diclofenac Sodium  2 g Topical QID   gabapentin  300 mg Oral QHS   heparin  5,000 Units Subcutaneous Q8H   insulin aspart  0-5 Units Subcutaneous QHS   insulin aspart  0-9 Units Subcutaneous TID WC   ipratropium-albuterol  3 mL Nebulization BID   melatonin  3 mg Oral QHS   midodrine  5 mg Oral TID WC   pantoprazole  40 mg Oral Daily   rifampin  300 mg Oral BID   sorbitol  15 mL Oral Daily   Elmarie Shiley, MD 07/01/2022, 7:51 AM

## 2022-07-01 NOTE — TOC Initial Note (Addendum)
Transition of Care (TOC) - Initial/Assessment Note    Patient Details  Name: [name]. MRN: [id number] Date of Birth: 08-27-1955  Transition of Care [facility]) CM/SW Contact:    Milas Gain, [place] Phone Number: 07/01/2022, 1:40 PM  Clinical Narrative:                  Patient is from [facility] short term. TOC awaiting PT/OT recommendations.TOC  will continue to follow and assist with patients dc planning needs.  Expected Discharge Plan: Skilled Nursing Facility Barriers to Discharge: Continued Medical Work up   Patient Goals and CMS Choice Patient states their goals for this hospitalization and ongoing recovery are:: return to Rehab CMS Medicare.gov Compare Post Acute Care list provided to:: Patient        Expected Discharge Plan and Services                                              Prior Living Arrangements/Services                       Activities of Daily Living Home Assistive Devices/Equipment: Wheelchair ADL Screening (condition at time of admission) Patient's cognitive ability adequate to safely complete daily activities?: Yes Is the patient deaf or have difficulty hearing?: No Does the patient have difficulty seeing, even when wearing glasses/contacts?: No Does the patient have difficulty concentrating, remembering, or making decisions?: No Patient able to express need for assistance with ADLs?: Yes Does the patient have difficulty dressing or bathing?: Yes Independently performs ADLs?: No Communication: Needs assistance Is this a change from baseline?: Pre-admission baseline Dressing (OT): Needs assistance Is this a change from baseline?: Pre-admission baseline Grooming: Needs assistance Is this a change from baseline?: Pre-admission baseline Feeding: Needs assistance Is this a change from baseline?: Pre-admission baseline Bathing: Needs assistance Is this a change from baseline?: Pre-admission baseline Toileting: Needs  assistance Is this a change from baseline?: Pre-admission baseline In/Out Bed: Dependent Is this a change from baseline?: Pre-admission baseline Walks in Home: Needs assistance Is this a change from baseline?: Pre-admission baseline Does the patient have difficulty walking or climbing stairs?: Yes Weakness of Legs: Both (bilateral amputee) Weakness of Arms/Hands: None  Permission Sought/Granted                  Emotional Assessment              Admission diagnosis:  Volume overload [id number] Systolic congestive heart failure, unspecified HF chronicity [I50.20] Patient Active Problem List   Diagnosis Date Noted   Acute pulmonary edema ([place]) 06/30/2022   Pleural effusion 06/30/2022   Pneumonia of right lower lobe due to infectious organism 06/30/2022   Acute hypercapnic respiratory failure ([place]) 06/28/2022   Advanced care planning/counseling discussion 06/28/2022   Acute respiratory failure with hypoxia ([place]) 06/28/2022   Class 1 obesity due to excess calories with body mass index (BMI) of 33.0 to 33.9 in adult 05/28/2022   Bacteremia 05/24/2022   Kidney lesion, native, left 05/24/2022   Gynecomastia 0000000   Complication associated with dialysis catheter 05/25/2021   COVID-19 virus infection 05/25/2021   ESRD (end stage renal disease) on dialysis ([place]) 02/18/2021   Anemia 09/15/2016   Abscess 09/09/2016   Abscess of buttock, right 09/08/2016   ESRD (end stage renal disease) ([place]) 09/08/2016   S/P bilateral BKA (below  knee amputation) ([place]) 09/08/2016   Anemia due to chronic kidney disease 09/08/2016   Chronic combined systolic (congestive) and diastolic (congestive) heart failure ([place]) 06/20/2016   Acute hypoxemic respiratory failure ([place]) 06/19/2016   Volume overload 06/19/2016   AKI (acute kidney injury) ([place]) 06/19/2016   DM (diabetes mellitus), type 2 with peripheral vascular complications ([place]) 0000000   SOB (shortness of breath)    ACHILLES BURSITIS  OR TENDINITIS 09/14/2009   PLANTAR FACIITIS 09/14/2009   TIA 09/07/2009   Peripheral vascular disease ([place]) 01/07/2009   Hyperlipidemia 08/27/2008   Essential hypertension 08/27/2008   PCP:  [name], MD Pharmacy:   [facility] [place] - [address] AT [address] [place] [address] Phone: [phone] Fax: [phone]     Social Determinants of Health (SDOH) Social History: SDOH Screenings   Food Insecurity: Food Insecurity Present (06/24/2022)  Housing: Low Risk  (06/24/2022)  Transportation Needs: No Transportation Needs (06/24/2022)  Utilities: Not At Risk (06/24/2022)  Tobacco Use: Medium Risk (06/27/2022)   SDOH Interventions: Food Insecurity Interventions: Inpatient TOC Housing Interventions: Intervention Not Indicated Utilities Interventions: Intervention Not Indicated   Readmission Risk Interventions     No data to display

## 2022-07-02 DIAGNOSIS — J9602 Acute respiratory failure with hypercapnia: Secondary | ICD-10-CM | POA: Diagnosis not present

## 2022-07-02 DIAGNOSIS — I5020 Unspecified systolic (congestive) heart failure: Secondary | ICD-10-CM | POA: Diagnosis not present

## 2022-07-02 DIAGNOSIS — J90 Pleural effusion, not elsewhere classified: Secondary | ICD-10-CM | POA: Diagnosis not present

## 2022-07-02 DIAGNOSIS — N186 End stage renal disease: Secondary | ICD-10-CM | POA: Diagnosis not present

## 2022-07-02 DIAGNOSIS — Z66 Do not resuscitate: Secondary | ICD-10-CM

## 2022-07-02 DIAGNOSIS — Z515 Encounter for palliative care: Secondary | ICD-10-CM

## 2022-07-02 LAB — RENAL FUNCTION PANEL
Albumin: 2.4 g/dL — ABNORMAL LOW (ref 3.5–5.0)
Anion gap: 11 (ref 5–15)
BUN: 15 mg/dL (ref 8–23)
CO2: 27 mmol/L (ref 22–32)
Calcium: 9.3 mg/dL (ref 8.9–10.3)
Chloride: 96 mmol/L — ABNORMAL LOW (ref 98–111)
Creatinine, Ser: 3.1 mg/dL — ABNORMAL HIGH (ref 0.61–1.24)
GFR, Estimated: 21 mL/min — ABNORMAL LOW (ref >60)
Glucose, Bld: 86 mg/dL (ref 70–99)
Phosphorus: 2.9 mg/dL (ref 2.5–4.6)
Potassium: 3.5 mmol/L (ref 3.5–5.1)
Sodium: 134 mmol/L — ABNORMAL LOW (ref 135–145)

## 2022-07-02 LAB — GLUCOSE, CAPILLARY
Glucose-Capillary: 92 mg/dL (ref 70–99)
Glucose-Capillary: 96 mg/dL (ref 70–99)
Glucose-Capillary: 174 mg/dL — ABNORMAL HIGH (ref 70–99)
Glucose-Capillary: 155 mg/dL — ABNORMAL HIGH (ref 70–99)

## 2022-07-02 MED ORDER — SORBITOL 70 % SOLN
30.0000 mL | Freq: Every day | Status: DC
  Administered 2022-07-02 – 2022-07-05 (×4): 30 mL via ORAL
  Filled 2022-07-02 (×6): qty 30

## 2022-07-02 MED ORDER — DEXMEDETOMIDINE HCL IN NACL 400 MCG/100ML IV SOLN
0.0000 ug/kg/h | INTRAVENOUS | Status: DC
  Administered 2022-07-02: 0.2 ug/kg/h via INTRAVENOUS
  Administered 2022-07-04: 0.4 ug/kg/h via INTRAVENOUS
  Filled 2022-07-02 (×2): qty 100

## 2022-07-02 NOTE — Progress Notes (Signed)
Patient ID: Godfrey Pick., male   DOB: November 30, 1955, 67 y.o.   MRN: [id number] [facility] Progress Note   Assessment/ Plan:   1.  Acute hypoxic respiratory failure: Suspected to be from a combination of volume overload (predominantly) and right lung pneumonia.  Records reviewed indicate hypotension has been limiting to ultrafiltration/attaining dry weight as an outpatient.  He has had daily dialysis for the last 3 days with net ultrafiltration of -12.8 L during hospitalization with good oxygenation noted overnight.  No plans for dialysis today with next dialysis scheduled for 07/04/2022.  Altered mentation possibly hypercapnic. 2. ESRD: Usually on a Monday/Wednesday/Friday schedule at [facility] with plans to undertake hemodialysis on 4/1 to continue/resume his routine schedule.  . 3. Anemia: Continue ESA for management of anemia of chronic disease.  No overt loss noted. 4. CKD-MBD: Calcium and phosphorus levels both at goal without phosphorus binders.  Will continue to follow trends. 5. Nutrition: Continue renal diet with fluid restriction. 6. Hypertension: Blood pressure "soft" while on midodrine, monitor with hemodialysis and efforts at volume unloading/ultrafiltration.  Subjective:   Requested to decrease dialysis time yesterday because of fatigue with daily dialysis.  Somewhat lethargic/somnolent this morning with minimal verbal interaction.   Objective:   BP 104/88   Pulse 80   Temp 98.1 F (36.7 C) (Axillary)   Resp 12   Ht 4\' 3"  (1.295 m)   Wt 95 kg   SpO2 100%   BMI 56.61 kg/m   Physical Exam: Gen: Lethargic, sleeping and difficult to arouse.  Oxygen via nasal cannula CVS: Pulse regular rhythm, normal rate, S1 and S2 normal Resp: Coarse breath sounds bilaterally with intermittent expiratory rhonchi.  Right IJ TDC Abd: Soft, obese, nontender, bowel sounds normal Ext: Status post bilateral AKA  Labs: BMET Recent Labs  Lab 06/27/22 0646 06/28/22 1220  06/28/22 2005 06/29/22 0844 06/30/22 0145 06/30/22 1229 07/01/22 0008 07/01/22 0633 07/02/22 0138  NA 129* 131* 132* 132* 131* 132* 129* 130* 134*  K 4.2 3.6 3.8 4.1 3.7 3.4* 3.3* 3.5 3.5  CL 92* 94*  --  92* 95* 90*  --  92* 96*  CO2 24 25  --  24 22 28   --  26 27  GLUCOSE 157* 117*  --  115* 147* 116*  --  107* 86  BUN 26* 25*  --  34* 22 14  --  18 15  CREATININE 4.70* 4.02*  --  4.92* 3.65* 2.27*  --  3.24* 3.10*  CALCIUM 9.0 9.0  --  9.8 8.9 9.1  --  9.2 9.3  PHOS 4.7*  --   --  4.9* 3.6 2.3*  --  3.4 2.9   CBC Recent Labs  Lab 06/28/22 1220 06/28/22 2005 06/29/22 0844 06/30/22 0145 07/01/22 0008 07/01/22 0645  WBC 9.7  --  9.4 11.9*  --  8.6  HGB 7.9*   < > 8.6* 8.5* 9.2* 8.5*  HCT 25.9*   < > 28.0* 26.7* 27.0* 27.1*  MCV 102.4*  --  100.0 98.2  --  99.3  PLT 143*  --  175 186  --  149*   < > = values in this interval not displayed.      Medications:     atorvastatin  40 mg Oral Daily   Chlorhexidine Gluconate Cloth  6 each Topical Daily   Chlorhexidine Gluconate Cloth  6 each Topical Q0600   clopidogrel  75 mg Oral QHS   darbepoetin (ARANESP) injection - DIALYSIS  150 mcg Subcutaneous Q Fri-1800   diclofenac Sodium  2 g Topical QID   gabapentin  300 mg Oral QHS   heparin  5,000 Units Subcutaneous Q8H   insulin aspart  0-5 Units Subcutaneous QHS   insulin aspart  0-9 Units Subcutaneous TID WC   ipratropium-albuterol  3 mL Nebulization BID   melatonin  3 mg Oral QHS   midodrine  5 mg Oral TID WC   pantoprazole  40 mg Oral Daily   rifampin  300 mg Oral BID   sorbitol  15 mL Oral Daily   Elmarie Shiley, MD 07/02/2022, 8:30 AM

## 2022-07-02 NOTE — IPAL (Signed)
  Interdisciplinary Goals of [name] Family Meeting   Date carried out: 07/02/2022  Location of the meeting: Bedside  Member's involved: Physician, Bedside Registered Nurse, and Family Member or next of kin  Durable Power of Attorney or acting medical decision maker: patient, son, brother  Discussion: We discussed goals of [name] for [name].  Discussed how we are essentially are forcing BIPAP on him whenever he falls asleep and he is intermittently refusing while he is awake.  At time of eval he is awake and states that he would like [place] to continue to put the BIPAP mask on him whenever he gets hypercarbic.  In event this does not improve his mental status, he would like a natural passing.  All family present and are in agreement with this plan.  Code status:   Code Status: DNR   Disposition: Continue current acute [name]  Time spent for the meeting: 11 mins    Candee Furbish, MD  07/02/2022, 5:35 PM

## 2022-07-02 NOTE — Progress Notes (Signed)
NAME:  [name]., MRN:  [id number], DOB:  November 18, 1955, LOS: 6 ADMISSION DATE:  06/24/2022, CONSULTATION DATE:  06/27/2022 REFERRING MD:  Eliseo Squires [facility] CHIEF COMPLAINT: Acute respiratory distress    History of Present Illness:  67 yobm remote smoker HD pt with endocarditis/ Aortic insufficiency admtted p [facility] to complete HD 3/22 with evidence of vol overloadd / bilateral pl effusions on admit > bipap dependent resp failure and PCCM service requested eval am 3/25.    67 yobm with medical history significant of recent hospitalization with bacteremia,  ESRD on HD, DM.  Patient was brought in from [facility] with c/o SOB.  He had over 1/2 of his HD remaining as he only completed 1 of his 4 hours on 3/22.  Patient denied chest pain, denies fevers, denies chills.  Patient reported he had fallen asleep and woke up coughing and was sent to the ER.  O2 sats were 95% on RA.     In the ER, x ray showed volume overload, elevated BNP, and low K.  ER doc spoke with renal MD who suggested admission and to finish his HD session  Pertinent Medical History:   Past Medical History:  Diagnosis Date   AICD (automatic cardioverter/defibrillator) present    Anemia    Arthritis    Cerebrovascular disease    CHF (congestive heart failure) (HCC)    Chronic kidney disease    ESRD, MWF HD   COVID 2022   moderate   Diabetes mellitus    type II   History of TIAs    Hyperlipidemia    Hypertension    Nonischemic cardiomyopathy (HCC)    PVD (peripheral vascular disease) (HCC)    s/p B BKA   Shortness of breath dyspnea    with exertion    Skin disease    Rare   Stroke ([place])    "light stroke"   Tobacco abuse    Significant Hospital Events: Including procedures, antibiotic start and stop dates in addition to other pertinent events   Viral Panel 06/24/22 neg  Rocephin/Rifampin pre admit and continued 3/22 >>> MRSA PCR 3/23 neg  CTA 3/24 c/w CHF with edema and R > L effusions  Echo 3/25 >> LVEF 30-35%,  moderate decreased LV function, +RWMAs, moderate LV dilation, no LV thrombus, normal RV function, trivial MR, mild AR 3/26 tx from [facility], intermittent BiPAP, iHD, abx, precedex 3/27 iHD 3.5L off 3/28 iHD 3L off, hypercarbic again, BiPAP  Interim History / Subjective:  Somnolent this am. Wore BIPAP last night.  Objective:  Blood pressure (!) 158/63, pulse 81, temperature 98.1 F (36.7 C), temperature source Axillary, resp. rate 11, height 4\' 3"  (1.295 m), weight 95 kg, SpO2 100 %.    Vent Mode: Stand-by FiO2 (%):  [40 %] 40 % Set Rate:  [12 bmp] 12 bmp PEEP:  [6 cmH20] 6 cmH20   Intake/Output Summary (Last 24 hours) at 07/02/2022 1005 Last data filed at 07/01/2022 2000 Gross per 24 hour  Intake 100 ml  Output 3000 ml  Net -2900 ml    Filed Weights   06/30/22 1515 07/01/22 0945 07/01/22 1228  Weight: 100 kg 89 kg 95 kg   Physical Examination: Sleeping in bed, will arouse and mumble then fall back asleep Lungs sounds okay Heart sounds okay Bilateral BKA noted RASS -2  BMP/CBG reviewed No CBC  Assessment & Plan:  Recurrent hypercarbia, noncompliance with BIPAP resulting in issues with hypercarbic delirium HFrEF ESRD on HD Suspected  HCAP Chronic systolic heart failure Staph lugdunsei aortic valve abscess, not surgical candidate Intermittent constipation 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 Moderate protein calorie malnutrition POA GOC  - Continue BIPAP qHS and PRN - iHD per nephrology - Continue prolonged abx as ordered - Increase sorbitol - Palliative input appreciated, I think it may be time for hospice, brother aware of consult  Best Practice (right click and "Reselect all SmartList Selections" daily)  Diet/type: Regular consistency (see orders)> renal / heart healthy DVT prophylaxis: SCDs, SQH GI prophylaxis: PPI Central venous access:  Yes, and it is still needed - HD catheter Foley:  N/A Code Status:  DNR/ ok with short term intubation  Last date of  multidisciplinary goals of care discussion 3/29, PMT consulted  33 min cc time Erskine Emery MD PCCM

## 2022-07-02 NOTE — Evaluation (Signed)
Physical Therapy Evaluation Patient Details Name: [name]. MRN: [id number] DOB: [birth year] Today's Date: 07/02/2022  History of Present Illness  67 yo male admitted 3/22 from HD with SOB with bil pleural effusion, pulmonary edema, metabolic encephalopathy. PMhx: Pt with recent admission 2/20-3/5 due to bacteremia and endocardititis with D/C to SNF. bil BKA, HTN, DM, ESRD on HD MWF, PAD, anemia, HLD, CHF, AICD, psoriasis  Clinical Impression  Pt with flat affect needing cues to maintain eyes open and attend to task. Pt with asterixis limiting mobility and BUE function with transfers and mobility. Pt reports he has not been OOB for sometime and could not recall last time OOB. Pt lives alone and return to SNF recommended. Pt with max +2 assist for limited scooting transfers and continues to require further therapy and assist to maximize strength and function. Will follow acutely with recommendation for lift OOB with nursing staff.   SPo2 98% on RA HR 79-96       Recommendations for follow up therapy are one component of a multi-disciplinary discharge planning process, led by the attending physician.  Recommendations may be updated based on patient status, additional functional criteria and insurance authorization.  Follow Up Recommendations Can patient physically be transported by private vehicle: No     Assistance Recommended at Discharge Frequent or constant Supervision/Assistance  Patient can return home with the following  A lot of help with bathing/dressing/bathroom;Two people to help with walking and/or transfers;Assistance with cooking/housework;Direct supervision/assist for medications management;Assist for transportation    Equipment Recommendations None recommended by PT  Recommendations for Other Services       Functional Status Assessment Patient has had a recent decline in their functional status and/or demonstrates limited ability to make significant improvements in  function in a reasonable and predictable amount of time     Precautions / Restrictions Precautions Precautions: Fall Precaution Comments: bil BKA Restrictions Weight Bearing Restrictions: Yes      Mobility  Bed Mobility Overal bed mobility: Needs Assistance Bed Mobility: Supine to Sit, Sit to Supine     Supine to sit: Max assist, HOB elevated Sit to supine: Max assist, +2 for physical assistance   General bed mobility comments: max assist to roll to right and rise to sitting with HOB 20 degrees. return to supine with max +2 and mod cues. Pt then able to sit up into long sitting without assist with HOB 20 degrees end of session due to pt initiating. Max +2 to slide toward [facility]    Transfers Overall transfer level: Needs assistance                Lateral/Scoot Transfers: Max assist, +2 physical assistance General transfer comment: Max +2 for lateral scoot toward HOB. Pt with asterexis BUE limiting ability to scoot without assist despite repeated attempts. Pt unable to problem solve correct setup for chair for possible transfer. Was going to initiate lift OOB but maxisky battery dead and RN made aware with lift placed on [employer] fund[employer]    Ambulation/Gait                  Stairs            Wheelchair Mobility    Modified Rankin (Stroke Patients Only)       Balance Overall balance assessment: Needs assistance Sitting-balance support: No upper extremity supported, Bilateral upper extremity supported Sitting balance-Leahy Scale: Fair Sitting balance - Comments: static sitting with guarding  Pertinent Vitals/Pain Pain Assessment Pain Assessment: No/denies pain    Home Living Family/patient expects to be discharged to:: Skilled nursing facility Living Arrangements: Alone   Type of Home: Apartment         Home Layout: One level Home Equipment: Wheelchair - manual      Prior Function Prior Level  of Function : Needs assist             Mobility Comments: pt reports he was scooting to WB prior to last admission. Since then has not been able to get OOB       Hand Dominance        Extremity/Trunk Assessment   Upper Extremity Assessment Upper Extremity Assessment: Defer to OT evaluation    Lower Extremity Assessment Lower Extremity Assessment: Generalized weakness    Cervical / Trunk Assessment Cervical / Trunk Assessment: Kyphotic  Communication   Communication: No difficulties  Cognition Arousal/Alertness: Awake/alert Behavior During Therapy: Flat affect Overall Cognitive Status: Impaired/Different from baseline Area of Impairment: Orientation, Memory, Attention, Problem solving, Safety/judgement, Following commands                 Orientation Level: Disoriented to, Time, Place, Situation Current Attention Level: Focused Memory: Decreased short-term memory Following Commands: Follows one step commands inconsistently, Follows one step commands with increased time Safety/Judgement: Decreased awareness of safety, Decreased awareness of deficits   Problem Solving: Slow processing, Requires verbal cues, Requires tactile cues          General Comments      Exercises     Assessment/Plan    PT Assessment Patient needs continued PT services  PT Problem List Decreased strength;Decreased activity tolerance;Decreased mobility;Decreased balance;Decreased cognition       PT Treatment Interventions DME instruction;Therapeutic exercise;Gait training;Balance training;Stair training;Neuromuscular re-education;Functional mobility training;Therapeutic activities;Patient/family education    PT Goals (Current goals can be found in the Care Plan section)  Acute Rehab PT Goals Patient Stated Goal: be able to get OOB PT Goal Formulation: With patient Time For Goal Achievement: 07/16/22 Potential to Achieve Goals: Poor    Frequency Min 1X/week      Co-evaluation PT/OT/SLP Co-Evaluation/Treatment: Yes   PT goals addressed during session: Mobility/safety with mobility;Balance         AM-PAC PT "6 Clicks" Mobility  Outcome Measure Help needed turning from your back to your side while in a flat bed without using bedrails?: A Lot Help needed moving from lying on your back to sitting on the side of a flat bed without using bedrails?: A Lot Help needed moving to and from a bed to a chair (including a wheelchair)?: Total Help needed standing up from a chair using your arms (e.g., wheelchair or bedside chair)?: Total Help needed to walk in hospital room?: Total Help needed climbing 3-5 steps with a railing? : Total 6 Click Score: 8    End of Session   Activity Tolerance: Patient limited by fatigue Patient left: in bed;with call bell/phone within reach;with nursing/sitter in room Nurse Communication: Mobility status;Need for lift equipment PT Visit Diagnosis: Other abnormalities of gait and mobility (R26.89);Muscle weakness (generalized) (M62.81)    Time: [id number] PT Time Calculation (min) (ACUTE ONLY): 19 min   Charges:   PT Evaluation $PT Eval Moderate Complexity: 1 Mod          [place], PT Acute Rehabilitation Services Office: [phone]   Lamarr Lulas 07/02/2022, 10:37 AM

## 2022-07-02 NOTE — Progress Notes (Signed)
[place] Progress Note Patient Name: [name]. DOB: 1955-09-30 MRN: [id number]   Date of Service  07/02/2022  HPI/Events of Note  Anxious, slightly agitated, about to be placed back on BIPAP.   eICU Interventions  Placed order for precedex to help pt tolerate BIPAP.  Will monitor for bradycardia/hypotension        Danuta Huseman M DELA CRUZ 07/02/2022, 11:34 PM

## 2022-07-02 NOTE — Progress Notes (Signed)
[place] Progress Note Patient Name: [name]. DOB: [birth year] MRN: [id number]   Date of Service  07/02/2022  HPI/Events of Note  Reviewed notes from earlier family meeting regarding code status and goals of care.  Based on the discussions, pt is DNR/DNI as confirmed by family.   Camera'd into the room as well and spoke with the patient who confirms his wishes. He is agreeable with being on BIPAP intermittently  but does not want to be on invasive ventilation  eICU Interventions  Modified code status to DNR/DNI        Lovinia Snare M DELA CRUZ 07/02/2022, 9:14 PM

## 2022-07-02 NOTE — Consult Note (Signed)
Palliative Care Consult Note                                  Date: 07/02/2022   Patient Name: [name].  DOB: January 19, 1956  MRN: [id number]  Age / Sex: 67 y.o., male  PCP: [name], MD Referring Physician: Candee Furbish, MD  Reason for Consultation: Establishing goals of care  HPI/Patient Profile: Palliative Care consult requested for goals of care discussion in this 67 y.o. male  with medical history significant for hypertension, IDDM, PAD, ESRD on HD (MWF), CHF, PAD s/p bilateral BKA, hyperlipidemia, and CVA. He was admitted from [facility] on 06/24/2022 with shortness of breath and bilateral pleural effusions. Chest x-ray showed volume overload. He was unable to complete scheduled dialysis due to this. Recent hospitalization with bacteremia.   Past Medical History:  Diagnosis Date   AICD (automatic cardioverter/defibrillator) present    Anemia    Arthritis    Cerebrovascular disease    CHF (congestive heart failure) (HCC)    Chronic kidney disease    ESRD, MWF HD   COVID 2022   moderate   Diabetes mellitus    type II   History of TIAs    Hyperlipidemia    Hypertension    Nonischemic cardiomyopathy (HCC)    PVD (peripheral vascular disease) (HCC)    s/p B BKA   Shortness of breath dyspnea    with exertion    Skin disease    Rare   Stroke ([place])    "light stroke"   Tobacco abuse      Subjective:   This NP Osborne Oman reviewed medical records, received report from team, assessed the patient and then met at the patient's bedside with Mr. Wenderoth to discuss diagnosis, prognosis, GOC, EOL wishes disposition and options.  Patient was awake and alert. He is able to most questions appropriately. No family present at bedside. He is complaining of soreness to his sacrum. Assisted in repositioning.   I was able to speak with his brother [name] and son Cristie Hem via phone. Patient provided permission to speak with them  acknowledging [name] would be his decision maker if needed.    Concept of Palliative Care was introduced as specialized medical care for people and their families living with serious illness.  It focuses on providing relief from the symptoms and stress of a serious illness.  The goal is to improve quality of life for both the patient and the family. Values and goals of care important to patient and family were attempted to be elicited.  I created space and opportunity for patient and family to explore state of health prior to admission, thoughts, and feelings.   Mr. Grahn was recently at [facility] rehab. Prior to this he lived in the home alone. He reports having one son who lives with his ex-wife. Has not worked in many years and has been disabled.   Patient's family express he was only at rehab a week before returning to the hospital.   We discussed His current illness and what it means in the larger context of His on-going co-morbidities. Natural disease trajectory and expectations were discussed.  Family verbalized understanding of his Nathanyal's current illness and co-morbidities. They are appreciative of what seems to be somewhat of an improvement specifically that patient is able to engage in discussions appropriately. They are hopeful he will be able to make decisions  for himself but prepared to stand in proxy for him if needed.   Patient and family clear in expressed wishes at this time to continue to treat the treatable allowing him every opportunity to continue to thrive. DNR confirmed as his desire would be for natural death once that time comes. They would like to take things one day at a time and make complex decisions as needed.   I discussed the importance of continued conversation with family and their medical providers regarding overall plan of care and treatment options, ensuring decisions are within the context of the patients values and GOCs.  Questions and concerns were addressed.  The  family was encouraged to call with questions or concerns.  PMT will continue to support holistically as needed.   Objective:   Primary Diagnoses: Present on Admission:  Volume overload  ESRD (end stage renal disease) (HCC)   Scheduled Meds:  atorvastatin  40 mg Oral Daily   Chlorhexidine Gluconate Cloth  6 each Topical Daily   Chlorhexidine Gluconate Cloth  6 each Topical Q0600   clopidogrel  75 mg Oral QHS   darbepoetin (ARANESP) injection - DIALYSIS  150 mcg Subcutaneous Q Fri-1800   diclofenac Sodium  2 g Topical QID   gabapentin  300 mg Oral QHS   heparin  5,000 Units Subcutaneous Q8H   insulin aspart  0-5 Units Subcutaneous QHS   insulin aspart  0-9 Units Subcutaneous TID WC   ipratropium-albuterol  3 mL Nebulization BID   melatonin  3 mg Oral QHS   midodrine  5 mg Oral TID WC   pantoprazole  40 mg Oral Daily   rifampin  300 mg Oral BID   sorbitol  30 mL Oral Q0600    Continuous Infusions:  ceFEPime (MAXIPIME) IV 1 g (07/02/22 1325)    PRN Meds: acetaminophen **OR** acetaminophen, albuterol, alteplase, bisacodyl, heparin, heparin, ondansetron **OR** ondansetron (ZOFRAN) IV, mouth rinse, oxyCODONE, sodium chloride  Allergies  Allergen Reactions   Contrast Media [Iodinated Contrast Media] Hives and Other (See Comments)    Happened "in the 80's"   Other Other (See Comments)    "Transfer Dye" = "Makes me tired"   Acetazolamide Anxiety and Other (See Comments)    Jittery, odd feeling (hyper feeling)    Review of Systems  Unable to perform ROS: Acuity of condition    Physical Exam General: awake and alert, chronically-ill appearing Cardiovascular: regular rate and rhythm Pulmonary:  diminished bilaterally  Abdomen: soft, nontender, + bowel sounds Extremities: bilateral BKA Skin: no rashes, warm and dry Neurological: alert and able to answer most questions  Vital Signs:  BP (!) 140/55   Pulse 84   Temp 97.7 F (36.5 C) (Axillary)   Resp 19   Ht 4\' 3"   (1.295 m)   Wt 95 kg   SpO2 99%   BMI 56.61 kg/m  Pain Scale: 0-10   Pain Score: 0-No pain  SpO2: SpO2: 99 % O2 Device:SpO2: 99 % O2 Flow Rate: .O2 Flow Rate (L/min): 3 L/min  IO: Intake/output summary:  Intake/Output Summary (Last 24 hours) at 07/02/2022 1441 Last data filed at 07/01/2022 2000 Gross per 24 hour  Intake 100 ml  Output 0 ml  Net 100 ml    LBM: Last BM Date : 06/27/22 Baseline Weight: Weight: 120.9 kg Most recent weight: Weight: 95 kg      Palliative Assessment/Data:    Advanced Care Planning:   Primary Decision Maker: NEXT OF KIN  Code Status/Advance Care Planning: DNR  Assessment & Plan:   SUMMARY OF RECOMMENDATIONS   DNR/DNI-as confirmed by family Continue with current plan of care  Patient and family clear to continue to treat the treatable allowing him an opportunity to thrive. Hopeful for stability and the best but also preparing for worst. Taking things one day at a time being prepared to make complex decisions when needed.  PMT will continue to support and follow as needed. Please call team line with urgent needs.  Symptom Management:  Per Attending  Palliative Prophylaxis:  Bowel Regimen, Delirium Protocol, Frequent Pain Assessment, Palliative Wound Care, and Turn Reposition  Additional Recommendations (Limitations, Scope, Preferences): DNR/Treat the treatable   Psycho-social/Spiritual:  Desire for further Chaplaincy support: no Additional Recommendations:  ongoing goals of care discussion  Prognosis:  GUARDED  Discharge Planning:  To Be Determined would recommend outpatient palliative support at minimum.  Discussed with: patient's brother, [name] and son, Cristie Hem.   Patient and family expressed understanding and was in agreement with this plan.   Visit consisted of counseling and education dealing with the complex and emotionally intense issues of symptom management and palliative care in the setting of serious and potentially  life-threatening illness.Greater than 50%  of this time was spent counseling and coordinating care related to the above assessment and plan.  Signed by:  Alda Lea, AGPCNP-BC [place]   Phone: [phone] Pager: [phone] Amion: Bjorn Pippin   Thank you for allowing the Palliative Medicine Team to assist in the care of this patient. Please utilize secure chat with additional questions, if there is no response within 30 minutes please call the above phone number. Palliative Medicine Team providers are available by phone from 7am to 5pm daily and can be reached through the team cell phone.  Should this patient require assistance outside of these hours, please call the patient's attending physician.  *Please note that this is a verbal dictation therefore any spelling or grammatical errors are due to the "[employer]" system interpretation.

## 2022-07-02 NOTE — Evaluation (Addendum)
Occupational Therapy Evaluation Patient Details Name: [name]. MRN: [id number] DOB: 02-04-1956 Today's Date: 07/02/2022   History of Present Illness 67 yo male admitted 3/22 from HD with SOB with bil pleural effusion, pulmonary edema, metabolic encephalopathy. PMhx: Pt with recent admission 2/20-3/5 due to bacteremia and endocardititis with D/C to SNF. bil BKA, HTN, DM, ESRD on HD MWF, PAD, anemia, HLD, CHF, AICD, psoriasis   Clinical Impression   Patient admitted from SNF rehab for above and limited by problem list below.  He reports limited therapy since at rehab, needing assist for ADLs and has not been OOB. Today, he is oriented to self and month, requires re-orientation to place, year.  He follows simple commands inconsistently with increased time, poor attention, awareness and problem solving.  Cueing to maintain alertness and keep eyes open during session. Pt currently requires max assist for bed mobility, max assist +2 for lateral scooting minimally to HOB, and overall min to total assist for Adls.  Believe he will best benefit from continued OT services acutely and after dc at inpatient setting <3 hrs/day to progress towards PLOF and decrease burden of care.      Recommendations for follow up therapy are one component of a multi-disciplinary discharge planning process, led by the attending physician.  Recommendations may be updated based on patient status, additional functional criteria and insurance authorization.   Assistance Recommended at Discharge Frequent or constant Supervision/Assistance  Patient can return home with the following A lot of help with bathing/dressing/bathroom;Assistance with cooking/housework;Direct supervision/assist for medications management;Direct supervision/assist for financial management;Assist for transportation;Help with stairs or ramp for entrance;Two people to help with walking and/or transfers    Functional Status Assessment  Patient has had a  recent decline in their functional status and demonstrates the ability to make significant improvements in function in a reasonable and predictable amount of time.  Equipment Recommendations  Other (comment) (defer)    Recommendations for Other Services       Precautions / Restrictions Precautions Precautions: Fall Precaution Comments: bil BKA Restrictions Weight Bearing Restrictions: Yes      Mobility Bed Mobility Overal bed mobility: Needs Assistance Bed Mobility: Supine to Sit, Sit to Supine     Supine to sit: Max assist, HOB elevated Sit to supine: Max assist, +2 for physical assistance   General bed mobility comments: max assist to roll to right and rise to sitting with HOB 20 degrees. return to supine with max +2 and mod cues. Pt then able to sit up into long sitting without assist with HOB 20 degrees end of session due to pt initiating. Max +2 to slide toward [facility]    Transfers Overall transfer level: Needs assistance                Lateral/Scoot Transfers: Max assist, +2 physical assistance General transfer comment: Max +2 for lateral scoot toward HOB. Pt with asterexis BUE limiting ability to scoot without assist despite repeated attempts. Pt unable to problem solve correct setup for chair for possible transfer. Was going to initiate lift OOB but maxisky battery dead and RN made aware with lift placed on Char[employer] fund[employer]      Balance Overall balance assessment: Needs assistance Sitting-balance support: No upper extremity supported, Bilateral upper extremity supported Sitting balance-Leahy Scale: Fair Sitting balance - Comments: static sitting with guarding, limited dynamically  ADL either performed or assessed with clinical judgement   ADL Overall ADL's : Needs assistance/impaired     Grooming: Wash/dry face;Set up;Sitting           Upper Body Dressing : Minimal assistance;Sitting   Lower Body Dressing:  Maximal assistance;Sitting/lateral leans;Bed level   Toilet Transfer: Maximal assistance;+2 for physical assistance Toilet Transfer Details (indicate cue type and reason): simulated towards HOB, very limited         Functional mobility during ADLs: Maximal assistance;+2 for safety/equipment;+2 for physical assistance;Cueing for sequencing;Cueing for safety       Vision   Vision Assessment?: No apparent visual deficits Additional Comments: continue assessment, per last admission R sided vision deficits and decreased acuity; requires cueing to maintain eyes open this session     Perception     Praxis      Pertinent Vitals/Pain Pain Assessment Pain Assessment: No/denies pain     Hand Dominance Right   Extremity/Trunk Assessment Upper Extremity Assessment Upper Extremity Assessment: Generalized weakness (with asterixis, decreased coordination with attempts to use UEs)   Lower Extremity Assessment Lower Extremity Assessment: Defer to PT evaluation   Cervical / Trunk Assessment Cervical / Trunk Assessment: Kyphotic   Communication Communication Communication: No difficulties   Cognition Arousal/Alertness: Awake/alert Behavior During Therapy: Flat affect Overall Cognitive Status: Impaired/Different from baseline Area of Impairment: Orientation, Memory, Attention, Problem solving, Safety/judgement, Following commands, Awareness                 Orientation Level: Disoriented to, Time, Place, Situation Current Attention Level: Focused Memory: Decreased short-term memory Following Commands: Follows one step commands inconsistently, Follows one step commands with increased time Safety/Judgement: Decreased awareness of safety, Decreased awareness of deficits Awareness: Emergent Problem Solving: Slow processing, Requires verbal cues, Requires tactile cues, Difficulty sequencing, Decreased initiation General Comments: patient oriented to self and month, initally reports  he is at rehab and it is 2017- able to correct and recall after reoriented. He follows simple commands with increased time but repetition required. poor awareness and problem solving     General Comments  VSS on RA    Exercises     Shoulder Instructions      Home Living Family/patient expects to be discharged to:: Skilled nursing facility Living Arrangements: Alone   Type of Home: Apartment       Home Layout: One level     Bathroom Shower/Tub: Teacher, early years/pre: Standard     Home Equipment: Wheelchair - manual          Prior Functioning/Environment Prior Level of Function : Needs assist             Mobility Comments: pt reports he was scooting to WB prior to last admission. Since then has not been able to get OOB ADLs Comments: pt reports he was completing ADLs prior to last admission, but needing assist for ADls at rehab        OT Problem List: Decreased strength;Decreased range of motion;Decreased activity tolerance;Impaired balance (sitting and/or standing);Decreased knowledge of use of DME or AE;Decreased knowledge of precautions;Decreased coordination;Decreased cognition;Decreased safety awareness;Obesity      OT Treatment/Interventions: Self-care/ADL training;Therapeutic exercise;Energy conservation;DME and/or AE instruction;Therapeutic activities;Patient/family education;Cognitive remediation/compensation;Balance training    OT Goals(Current goals can be found in the care plan section) Acute Rehab OT Goals Patient Stated Goal: agreeable for return to rehab OT Goal Formulation: With patient Time For Goal Achievement: 07/16/22 Potential to Achieve Goals: Good  OT Frequency: Min 2X/week  Co-evaluation PT/OT/SLP Co-Evaluation/Treatment: Yes Reason for Co-Treatment: For patient/therapist safety;To address functional/ADL transfers PT goals addressed during session: Mobility/safety with mobility;Balance OT goals addressed during session:  ADL's and self-care      AM-PAC OT "6 Clicks" Daily Activity     Outcome Measure Help from another person eating meals?: A Little Help from another person taking care of personal grooming?: A Little Help from another person toileting, which includes using toliet, bedpan, or urinal?: Total Help from another person bathing (including washing, rinsing, drying)?: A Lot Help from another person to put on and taking off regular upper body clothing?: A Little Help from another person to put on and taking off regular lower body clothing?: A Lot 6 Click Score: 14   End of Session Nurse Communication: Mobility status  Activity Tolerance: Patient limited by lethargy Patient left: in bed;with call bell/phone within reach;with bed alarm set;with nursing/sitter in room  OT Visit Diagnosis: Other abnormalities of gait and mobility (R26.89);Muscle weakness (generalized) (M62.81);Other symptoms and signs involving cognitive function                Time: 1005-1025 OT Time Calculation (min): 20 min Charges:  OT General Charges $OT Visit: 1 Visit OT Evaluation $OT Eval Moderate Complexity: 1 Mod  Jolaine Artist, OT Acute Rehabilitation Services Office [phone]   Delight Stare 07/02/2022, 11:02 AM

## 2022-07-03 DIAGNOSIS — I5020 Unspecified systolic (congestive) heart failure: Secondary | ICD-10-CM | POA: Diagnosis not present

## 2022-07-03 LAB — CULTURE, BLOOD (ROUTINE X 2)
Culture: NO GROWTH
Culture: NO GROWTH
Special Requests: ADEQUATE
Special Requests: ADEQUATE

## 2022-07-03 LAB — CBC
HCT: 26.8 % — ABNORMAL LOW (ref 39.0–52.0)
Hemoglobin: 8.2 g/dL — ABNORMAL LOW (ref 13.0–17.0)
MCH: 31.1 pg (ref 26.0–34.0)
MCHC: 30.6 g/dL (ref 30.0–36.0)
MCV: 101.5 fL — ABNORMAL HIGH (ref 80.0–100.0)
Platelets: 119 10*3/uL — ABNORMAL LOW (ref 150–400)
RBC: 2.64 MIL/uL — ABNORMAL LOW (ref 4.22–5.81)
RDW: 17.7 % — ABNORMAL HIGH (ref 11.5–15.5)
WBC: 8.1 10*3/uL (ref 4.0–10.5)
nRBC: 0.4 % — ABNORMAL HIGH (ref 0.0–0.2)

## 2022-07-03 LAB — RENAL FUNCTION PANEL
Albumin: 2.1 g/dL — ABNORMAL LOW (ref 3.5–5.0)
Anion gap: 17 — ABNORMAL HIGH (ref 5–15)
BUN: 27 mg/dL — ABNORMAL HIGH (ref 8–23)
CO2: 23 mmol/L (ref 22–32)
Calcium: 9.2 mg/dL (ref 8.9–10.3)
Chloride: 92 mmol/L — ABNORMAL LOW (ref 98–111)
Creatinine, Ser: 4.54 mg/dL — ABNORMAL HIGH (ref 0.61–1.24)
GFR, Estimated: 13 mL/min — ABNORMAL LOW (ref >60)
Glucose, Bld: 168 mg/dL — ABNORMAL HIGH (ref 70–99)
Phosphorus: 3.2 mg/dL (ref 2.5–4.6)
Potassium: 3.5 mmol/L (ref 3.5–5.1)
Sodium: 132 mmol/L — ABNORMAL LOW (ref 135–145)

## 2022-07-03 LAB — GLUCOSE, CAPILLARY
Glucose-Capillary: 205 mg/dL — ABNORMAL HIGH (ref 70–99)
Glucose-Capillary: 142 mg/dL — ABNORMAL HIGH (ref 70–99)
Glucose-Capillary: 134 mg/dL — ABNORMAL HIGH (ref 70–99)
Glucose-Capillary: 129 mg/dL — ABNORMAL HIGH (ref 70–99)

## 2022-07-03 LAB — MAGNESIUM: Magnesium: 2 mg/dL (ref 1.7–2.4)

## 2022-07-03 MED ORDER — SENNOSIDES 8.8 MG/5ML PO SYRP
10.0000 mL | ORAL_SOLUTION | Freq: Two times a day (BID) | ORAL | Status: DC
  Administered 2022-07-03 – 2022-07-05 (×4): 10 mL
  Filled 2022-07-03 (×6): qty 10

## 2022-07-03 NOTE — TOC Progression Note (Addendum)
Transition of Care (TOC) - Progression Note    Patient Details  Name: [name]. MRN: [id number] Date of Birth: 27-Mar-1956  Transition of Care [facility]) CM/SW [place], [place] Phone Number: 07/03/2022, 11:41 AM  Clinical Narrative:     CSW spoke with pt's son Cristie Hem, he states pt was at [facility] for about a week and the plan (he believes) is for him to return at dc. Alex states pt was supposed to return home after he completed STR.  CSW spoke with Ebony Hail at [facility], she states pt can return as long as there is a bed available. Auth will need to be restarted prior to pt returning.   TOC will continue to follow.   Expected Discharge Plan: Skilled Nursing Facility Barriers to Discharge: Continued Medical Work up  Expected Discharge Plan and Services                                               Social Determinants of Health (SDOH) Interventions SDOH Screenings   Food Insecurity: Food Insecurity Present (06/24/2022)  Housing: Low Risk  (06/24/2022)  Transportation Needs: No Transportation Needs (06/24/2022)  Utilities: Not At Risk (06/24/2022)  Tobacco Use: Medium Risk (06/27/2022)    Readmission Risk Interventions     No data to display

## 2022-07-03 NOTE — Progress Notes (Signed)
Patient ID: [name] Pick., male   DOB: 10-02-1955, 67 y.o.   MRN: [id number] [facility] Progress Note   Assessment/ Plan:   1.  Acute hypoxic respiratory failure: Suspected to be from a combination of volume overload (predominantly) and right lung pneumonia.  Records reviewed indicate hypotension has been limiting to ultrafiltration/attaining dry weight as an outpatient.  He has had daily dialysis for the last 3 days with net ultrafiltration of -12.8 L during hospitalization with good oxygenation noted overnight.  Next hemodialysis ordered for tomorrow. 2. ESRD: Usually on a Monday/Wednesday/Friday schedule at [facility] with next hemodialysis ordered for tomorrow. 3. Anemia: Continue ESA for management of anemia of chronic disease.  No overt loss noted. 4. CKD-MBD: Calcium and phosphorus levels both at goal without phosphorus binders.  Will continue to follow trends. 5. Nutrition: Continue renal diet with fluid restriction. 6. Hypertension: Blood pressure "soft" while on midodrine, monitor with hemodialysis and efforts at volume unloading/ultrafiltration.  Subjective:   No acute events overnight, palliative care consult notes reviewed with regards to establishing goals of care with patient now DNR/DNI.   Objective:   BP (!) 130/58   Pulse 61   Temp 98.4 F (36.9 C) (Oral)   Resp 13   Ht 4\' 3"  (1.295 m)   Wt 95 kg   SpO2 95%   BMI 56.61 kg/m   Physical Exam: Gen: Sleeping soundly sitting up in bed.  Oxygen via nasal cannula CVS: Pulse regular rhythm, normal rate, S1 and S2 normal Resp: Coarse breath sounds bilaterally no distinct rales or rhonchi.  Right IJ TDC Abd: Soft, obese, nontender, bowel sounds normal Ext: Status post bilateral AKA  Labs: BMET Recent Labs  Lab 06/27/22 0646 06/28/22 1220 06/28/22 2005 06/29/22 0844 06/30/22 0145 06/30/22 1229 07/01/22 0008 07/01/22 0633 07/02/22 0138 07/03/22 0222  NA 129* 131*   < > 132* 131* 132* 129*  130* 134* 132*  K 4.2 3.6   < > 4.1 3.7 3.4* 3.3* 3.5 3.5 3.5  CL 92* 94*  --  92* 95* 90*  --  92* 96* 92*  CO2 24 25  --  24 22 28   --  26 27 23   GLUCOSE 157* 117*  --  115* 147* 116*  --  107* 86 168*  BUN 26* 25*  --  34* 22 14  --  18 15 27*  CREATININE 4.70* 4.02*  --  4.92* 3.65* 2.27*  --  3.24* 3.10* 4.54*  CALCIUM 9.0 9.0  --  9.8 8.9 9.1  --  9.2 9.3 9.2  PHOS 4.7*  --   --  4.9* 3.6 2.3*  --  3.4 2.9 3.2   < > = values in this interval not displayed.   CBC Recent Labs  Lab 06/29/22 0844 06/30/22 0145 07/01/22 0008 07/01/22 0645 07/03/22 0222  WBC 9.4 11.9*  --  8.6 8.1  HGB 8.6* 8.5* 9.2* 8.5* 8.2*  HCT 28.0* 26.7* 27.0* 27.1* 26.8*  MCV 100.0 98.2  --  99.3 101.5*  PLT 175 186  --  149* 119*      Medications:     atorvastatin  40 mg Oral Daily   Chlorhexidine Gluconate Cloth  6 each Topical Daily   Chlorhexidine Gluconate Cloth  6 each Topical Q0600   clopidogrel  75 mg Oral QHS   darbepoetin (ARANESP) injection - DIALYSIS  150 mcg Subcutaneous Q Fri-1800   diclofenac Sodium  2 g Topical QID   gabapentin  300  mg Oral QHS   heparin  5,000 Units Subcutaneous Q8H   insulin aspart  0-5 Units Subcutaneous QHS   insulin aspart  0-9 Units Subcutaneous TID WC   ipratropium-albuterol  3 mL Nebulization BID   melatonin  3 mg Oral QHS   midodrine  5 mg Oral TID WC   pantoprazole  40 mg Oral Daily   rifampin  300 mg Oral BID   sorbitol  30 mL Oral Q0600   Elmarie Shiley, MD 07/03/2022, 8:13 AM

## 2022-07-03 NOTE — Progress Notes (Signed)
NAME:  [name] Rardin., MRN:  [id number], DOB:  03/16/1956, LOS: 7 ADMISSION DATE:  06/24/2022, CONSULTATION DATE:  06/27/2022 REFERRING MD:  Eliseo Squires [facility] CHIEF COMPLAINT: Acute respiratory distress    History of Present Illness:  67 yobm remote smoker HD pt with endocarditis/ Aortic insufficiency admtted p unabel to complete HD 3/22 with evidence of vol overloadd / bilateral pl effusions on admit > bipap dependent resp failure and PCCM service requested eval am 3/25.    67 yobm with medical history significant of recent hospitalization with bacteremia,  ESRD on HD, DM.  Patient was brought in from [place] HD with c/o SOB.  He had over 1/2 of his HD remaining as he only completed 1 of his 4 hours on 3/22.  Patient denied chest pain, denies fevers, denies chills.  Patient reported he had fallen asleep and woke up coughing and was sent to the ER.  O2 sats were 95% on RA.     In the ER, x ray showed volume overload, elevated BNP, and low K.  ER doc spoke with renal MD who suggested admission and to finish his HD session  Pertinent Medical History:   Past Medical History:  Diagnosis Date   AICD (automatic cardioverter/defibrillator) present    Anemia    Arthritis    Cerebrovascular disease    CHF (congestive heart failure) (HCC)    Chronic kidney disease    ESRD, MWF HD   COVID 2022   moderate   Diabetes mellitus    type II   History of TIAs    Hyperlipidemia    Hypertension    Nonischemic cardiomyopathy (HCC)    PVD (peripheral vascular disease) (HCC)    s/p B BKA   Shortness of breath dyspnea    with exertion    Skin disease    Rare   Stroke ([place])    "light stroke"   Tobacco abuse    Significant Hospital Events: Including procedures, antibiotic start and stop dates in addition to other pertinent events   Viral Panel 06/24/22 neg  Rocephin/Rifampin pre admit and continued 3/22 >>> MRSA PCR 3/23 neg  CTA 3/24 c/w CHF with edema and R > L effusions  Echo 3/25 >> LVEF 30-35%,  moderate decreased LV function, +RWMAs, moderate LV dilation, no LV thrombus, normal RV function, trivial MR, mild AR 3/26 tx from [facility], intermittent BiPAP, iHD, abx, precedex 3/27 iHD 3.5L off 3/28 iHD 3L off, hypercarbic again, BiPAP  Interim History / Subjective:  Agitated, placed on precedex to tolerate bipap.  Objective:  Blood pressure (!) 94/41, pulse 60, temperature 98.4 F (36.9 C), temperature source Oral, resp. rate 11, height 4\' 3"  (1.295 m), weight 95 kg, SpO2 100 %.    FiO2 (%):  [30 %] 30 %   Intake/Output Summary (Last 24 hours) at 07/03/2022 0920 Last data filed at 07/03/2022 0900 Gross per 24 hour  Intake 125.48 ml  Output --  Net 125.48 ml    Filed Weights   06/30/22 1515 07/01/22 0945 07/01/22 1228  Weight: 100 kg 89 kg 95 kg   Physical Examination: Sleeping in bed without BIPAP Chronic scars stable Was AOx1 overnight Bilateral BKAs Abd soft, hypoactive bs  BMP ok Plts going down  Assessment & Plan:  Recurrent hypercarbia, noncompliance with BIPAP resulting in issues with hypercarbic delirium HFrEF ESRD on HD Suspected HCAP Chronic systolic heart failure Staph lugdunsei aortic valve abscess, 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  Cannot go to floor on precedex and intermittent obtundation requiring BIPAP.  Best Practice (right click and "Reselect all SmartList Selections" daily)  Diet/type: Regular consistency (see orders)> renal / heart healthy DVT prophylaxis: SCDs, SQH GI prophylaxis: PPI Central venous access:  Yes, and it is still needed - HD catheter Foley:  N/A Code Status:  full DNR Last date of  multidisciplinary goals of care discussion 3/30  38 min cc time Erskine Emery MD PCCM

## 2022-07-03 NOTE — Progress Notes (Signed)
Pt placed on BiPAP at this time due to pt being lethargic

## 2022-07-03 NOTE — Progress Notes (Signed)
Pharmacy Antibiotic Note  [name]. is a 67 y.o. male admitted on 06/24/2022 with hospital acquired pneumonia and previous bacteremia.  Pharmacy has been consulted for cefepime dosing. Patient has previous history of pan sensitive S. Lugdunesis bacteremia on 2/15. At that time, patient had HD catheter and AICD device. The patient underwent tunneled cath removal by IR on 2/23 with new tunneled right IJ dialysis cath placed on 2/26 after line holiday. 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 Patient was initiated on cefazolin and rifampin for previous bacteremia with end date to be 4/05.  Patient was transferred to [facility] for ICU care due to respiratory failure and hypoxia with complaints of shortness of breath. Pharmacy consulted to dose cefepime and rifampin for AICD infection. WBC WNL. Currently on HD MWF  Plan: Start Cefepime 1g IV q24h x7 days and d/c cefazolin for better gram negative coverage and staph lugdunesis Rifampin 300mg  PO BID per MD Monitor for signs of clinical improvement, WBC, fever trend, and any culture data   Height: 4\' 3"  (129.5 cm) Weight: 95 kg (209 lb 7 oz) IBW/kg (Calculated) : 29.3  Temp (24hrs), Avg:97.8 F (36.6 C), Min:96.7 F (35.9 C), Max:98.4 F (36.9 C)  Recent Labs  Lab 06/27/22 0646 06/27/22 1002 06/28/22 1220 06/29/22 0844 06/30/22 0145 06/30/22 1229 07/01/22 0633 07/01/22 0645 07/02/22 0138 07/03/22 0222  WBC 11.4*  --  9.7 9.4 11.9*  --   --  8.6  --  8.1  CREATININE 4.70*  --  4.02* 4.92* 3.65* 2.27* 3.24*  --  3.10* 4.54*  LATICACIDVEN 1.5 1.4  --   --   --   --   --   --   --   --      Estimated Creatinine Clearance: 12.1 mL/min (A) (by C-G formula based on SCr of 4.54 mg/dL (H)).    Allergies  Allergen Reactions   Contrast Media [Iodinated Contrast Media] Hives and Other (See Comments)    Happened "in the 80's"   Other Other (See Comments)    "Transfer  Dye" = "Makes me tired"   Acetazolamide Anxiety and Other (See Comments)    Jittery, odd feeling (hyper feeling)    Antimicrobials this admission: Cefazolin 2/21 >> 3/27 Rifampin 3/1 >>  Cefepime 3/27>>  Microbiology results: 2/15 Bcx from [place]: pan sensitive staph lugdunesis  2/21 Bcx: NGTD 3/26 Bcx: NGTD  3/26 MRSA PCR: negative  Thank you for involving pharmacy in this patient's care.  Reatha Harps, PharmD PGY2 Pharmacy Resident 07/03/2022 9:25 AM

## 2022-07-04 DIAGNOSIS — J9602 Acute respiratory failure with hypercapnia: Secondary | ICD-10-CM | POA: Diagnosis not present

## 2022-07-04 DIAGNOSIS — E8770 Fluid overload, unspecified: Secondary | ICD-10-CM | POA: Diagnosis not present

## 2022-07-04 DIAGNOSIS — I5023 Acute on chronic systolic (congestive) heart failure: Secondary | ICD-10-CM | POA: Insufficient documentation

## 2022-07-04 DIAGNOSIS — I5020 Unspecified systolic (congestive) heart failure: Secondary | ICD-10-CM

## 2022-07-04 DIAGNOSIS — J9601 Acute respiratory failure with hypoxia: Secondary | ICD-10-CM | POA: Diagnosis not present

## 2022-07-04 DIAGNOSIS — Z7189 Other specified counseling: Secondary | ICD-10-CM | POA: Insufficient documentation

## 2022-07-04 DIAGNOSIS — Z992 Dependence on renal dialysis: Secondary | ICD-10-CM

## 2022-07-04 LAB — GLUCOSE, CAPILLARY
Glucose-Capillary: 120 mg/dL — ABNORMAL HIGH (ref 70–99)
Glucose-Capillary: 124 mg/dL — ABNORMAL HIGH (ref 70–99)
Glucose-Capillary: 152 mg/dL — ABNORMAL HIGH (ref 70–99)
Glucose-Capillary: 139 mg/dL — ABNORMAL HIGH (ref 70–99)

## 2022-07-04 LAB — CBC
HCT: 30.2 % — ABNORMAL LOW (ref 39.0–52.0)
Hemoglobin: 9.2 g/dL — ABNORMAL LOW (ref 13.0–17.0)
MCH: 30.8 pg (ref 26.0–34.0)
MCHC: 30.5 g/dL (ref 30.0–36.0)
MCV: 101 fL — ABNORMAL HIGH (ref 80.0–100.0)
Platelets: 123 10*3/uL — ABNORMAL LOW (ref 150–400)
RBC: 2.99 MIL/uL — ABNORMAL LOW (ref 4.22–5.81)
RDW: 17.6 % — ABNORMAL HIGH (ref 11.5–15.5)
WBC: 8.5 10*3/uL (ref 4.0–10.5)
nRBC: 0.4 % — ABNORMAL HIGH (ref 0.0–0.2)

## 2022-07-04 LAB — BASIC METABOLIC PANEL
Anion gap: 12 (ref 5–15)
BUN: 34 mg/dL — ABNORMAL HIGH (ref 8–23)
CO2: 24 mmol/L (ref 22–32)
Calcium: 9.2 mg/dL (ref 8.9–10.3)
Chloride: 92 mmol/L — ABNORMAL LOW (ref 98–111)
Creatinine, Ser: 5.58 mg/dL — ABNORMAL HIGH (ref 0.61–1.24)
GFR, Estimated: 10 mL/min — ABNORMAL LOW (ref >60)
Glucose, Bld: 115 mg/dL — ABNORMAL HIGH (ref 70–99)
Potassium: 3.6 mmol/L (ref 3.5–5.1)
Sodium: 128 mmol/L — ABNORMAL LOW (ref 135–145)

## 2022-07-04 MED ORDER — VITAMIN B-12 1000 MCG PO TABS
1000.0000 ug | ORAL_TABLET | Freq: Every day | ORAL | Status: DC
  Administered 2022-07-04 – 2022-07-05 (×2): 1000 ug via ORAL
  Filled 2022-07-04 (×3): qty 1

## 2022-07-04 MED ORDER — MIDODRINE HCL 5 MG PO TABS
5.0000 mg | ORAL_TABLET | Freq: Once | ORAL | Status: AC
  Administered 2022-07-04: 5 mg via ORAL
  Filled 2022-07-04: qty 1

## 2022-07-04 MED ORDER — NOREPINEPHRINE 4 MG/250ML-% IV SOLN
2.0000 ug/min | INTRAVENOUS | Status: DC
  Administered 2022-07-04: 2 ug/min via INTRAVENOUS
  Administered 2022-07-05: 10 ug/min via INTRAVENOUS
  Filled 2022-07-04 (×2): qty 250

## 2022-07-04 MED ORDER — ASPIRIN 81 MG PO CHEW
81.0000 mg | CHEWABLE_TABLET | Freq: Every day | ORAL | Status: DC
  Administered 2022-07-04: 81 mg via ORAL
  Filled 2022-07-04: qty 1

## 2022-07-04 MED ORDER — HEPARIN SODIUM (PORCINE) 1000 UNIT/ML DIALYSIS: 40.0000 [IU]/kg | INTRAMUSCULAR | Status: DC | PRN

## 2022-07-04 MED ORDER — FOLIC ACID 1 MG PO TABS
1.0000 mg | ORAL_TABLET | Freq: Every day | ORAL | Status: DC
  Administered 2022-07-04: 1 mg via ORAL
  Filled 2022-07-04: qty 1

## 2022-07-04 MED ORDER — HEPARIN SODIUM (PORCINE) 1000 UNIT/ML IJ SOLN
INTRAMUSCULAR | Status: AC
  Administered 2022-07-04: 3800 [IU] via INTRAVENOUS_CENTRAL
  Filled 2022-07-04: qty 4

## 2022-07-04 MED ORDER — SODIUM CHLORIDE 0.9 % IV SOLN: 250.0000 mL | INTRAVENOUS | Status: DC

## 2022-07-04 MED ORDER — BISACODYL 10 MG RE SUPP
10.0000 mg | Freq: Once | RECTAL | Status: AC
  Administered 2022-07-04: 10 mg via RECTAL
  Filled 2022-07-04: qty 1

## 2022-07-04 MED ORDER — ALBUMIN HUMAN 25 % IV SOLN
12.5000 g | Freq: Once | INTRAVENOUS | Status: AC
  Administered 2022-07-04: 12.5 g via INTRAVENOUS
  Filled 2022-07-04: qty 50

## 2022-07-04 MED ORDER — DIPHENHYDRAMINE HCL 50 MG/ML IJ SOLN
25.0000 mg | Freq: Once | INTRAMUSCULAR | Status: AC | PRN
  Administered 2022-07-04: 25 mg via INTRAVENOUS
  Filled 2022-07-04: qty 1

## 2022-07-04 MED ORDER — HYDROCORTISONE 1 % EX CREA
TOPICAL_CREAM | Freq: Two times a day (BID) | CUTANEOUS | Status: DC
  Filled 2022-07-04 (×2): qty 28

## 2022-07-04 MED ORDER — CAMPHOR-MENTHOL 0.5-0.5 % EX LOTN
TOPICAL_LOTION | CUTANEOUS | Status: DC | PRN
  Filled 2022-07-04: qty 222

## 2022-07-04 NOTE — Progress Notes (Signed)
NAME:  [name]., MRN:  [id number], DOB:  1955-07-28, LOS: 8 ADMISSION DATE:  06/24/2022, CONSULTATION DATE:  06/27/2022 REFERRING MD:  [facility] CHIEF COMPLAINT: Acute respiratory distress    History of Present Illness:  17 yobm remote smoker HD pt with endocarditis/ Aortic insufficiency admtted to aph unable to complete HD 3/22 with evidence of vol overloadd / bilateral pl effusions on admit > bipap dependent resp failure and PCCM service requested eval am 3/25.    67 yobm with medical history significant of recent hospitalization with bacteremia,  ESRD on HD, DM.  Patient was brought in from [place] [facility] with c/o SOB.  He had over 1/2 of his HD remaining as he only completed 1 of his 4 hours on 3/22.  Patient denied chest pain, denies fevers, denies chills.  Patient reported he had fallen asleep and woke up coughing and was sent to the ER.  O2 sats were 95% on RA.     In the ER, x ray showed volume overload, elevated BNP, and low K.  ER doc spoke with renal MD who suggested admission and to finish his HD session  Pertinent Medical History:   Past Medical History:  Diagnosis Date   AICD (automatic cardioverter/defibrillator) present    Anemia    Arthritis    Cerebrovascular disease    CHF (congestive heart failure) (HCC)    Chronic kidney disease    ESRD, MWF HD   COVID 2022   moderate   Diabetes mellitus    type II   History of TIAs    Hyperlipidemia    Hypertension    Nonischemic cardiomyopathy (HCC)    PVD (peripheral vascular disease) (HCC)    s/p B BKA   Shortness of breath dyspnea    with exertion    Skin disease    Rare   Stroke ([place])    "light stroke"   Tobacco abuse    Significant Hospital Events: Including procedures, antibiotic start and stop dates in addition to other pertinent events   Viral Panel 06/24/22 neg  Rocephin/Rifampin pre admit and continued 3/22 >>> MRSA PCR 3/23 neg  CTA 3/24 c/w CHF with edema and R > L effusions  Echo 3/25 >> LVEF  30-35%, moderate decreased LV function, +RWMAs, moderate LV dilation, no LV thrombus, normal RV function, trivial MR, mild AR 3/26 tx from [facility], intermittent BiPAP, iHD, abx, precedex 3/27 iHD 3.5L off 3/28 iHD 3L off, hypercarbic again, BiPAP 4/1 remains intermittently on bipap  Interim History / Subjective:  Pulls off bipap intermittently overnight This am mildly lethargic but Aox3 [place]  Objective:  Blood pressure (!) 134/57, pulse 74, temperature 97.8 F (36.6 C), temperature source Axillary, resp. rate (!) 22, height 4\' 3"  (1.295 m), weight 95 kg, SpO2 100 %.    Vent Mode: BIPAP FiO2 (%):  [3 %-40 %] 40 % Set Rate:  [12 bmp] 12 bmp PEEP:  [6 cmH20] 6 cmH20   Intake/Output Summary (Last 24 hours) at 07/04/2022 0918 Last data filed at 07/03/2022 1800 Gross per 24 hour  Intake 102.58 ml  Output --  Net 102.58 ml    Filed Weights   06/30/22 1515 07/01/22 0945 07/01/22 1228  Weight: 100 kg 89 kg 95 kg   Physical Examination: General:  ill appearing male on [place] HEENT: MM pink/moist; [place] in place Neuro: lethargic but arouses and Aox3; moves BUE to command CV: s1s2, RRR, no m/r/g PULM:  dim clear BS bilaterally; [place]  4L GI: soft, bsx4 active  Extremities: warm/dry, B/l BKA   Assessment & Plan:  Recurrent hypercarbia, noncompliance with BIPAP resulting in issues with hypercarbic delirium Suspected OHS/OSA Pleural effusion R>L Suspected HCAP P: -cont bipap qhs and prn  -currently off precedex; as needed for bipap tolerance -wean [place] for sats >92% -cont HD for volume removal -pulm toiletry -cont cefepime as below -check CXR in am  HFrEF Chronic systolic heart failure P: -cont dialysis MWF per nephro -hold GMDT with soft BP -daily weights  ESRD on HD Chronic hyponatremia P: -cont diuresis MWF per nephro -Trend BMP / urinary output -Replace electrolytes as indicated -Avoid nephrotoxic agents, ensure adequate renal perfusion  Endocarditis Staph lugdunsei aortic  valve abscess, not surgical candidate P: -cont cefepime and rifampin per ID; cont cefepime x 7days then switch back to ancef  Intermittent constipation P: -bowel regimen -adding suppository  Renal vasoplegia P: -cont midodrine  Anemia of chronic ESRD Thrombocytopenia P: -trend cbc  Moderate protein calorie malnutrition POA P: -cont renal diet   Best Practice (right click and "Reselect all SmartList Selections" daily)  Diet/type: Regular consistency (see orders)> renal / heart healthy DVT prophylaxis: prophylactic heparin  GI prophylaxis: PPI Central venous access:  Yes, and it is still needed - HD catheter Foley:  N/A Code Status:  full DNR Last date of multidisciplinary goals of care discussion 3/30   CC time: 35 minutes  JD Rexene Agent [facility] 07/04/2022, 9:55 AM  Please see [email or web address] for pager details.  From 7A-7P if no response, please call [phone]. After hours, please call ELink [phone].

## 2022-07-04 NOTE — Progress Notes (Signed)
Patient ripped bipap mask off and stated he wanted his nose piece back.  Patient back on [place] at this time.  Will continue to monitor.

## 2022-07-04 NOTE — Progress Notes (Signed)
Patient ID: [name] Pick., male   DOB: 12-Sep-1955, 67 y.o.   MRN: [id number] [facility] Progress Note   Assessment/ Plan:   1.  Acute hypoxic respiratory failure: Suspected to be from a combination of volume overload (predominantly) and right lung pneumonia.  Records reviewed indicate hypotension has been limiting to ultrafiltration/attaining dry weight as an outpatient.  He has had daily dialysis for the last 3 days with net ultrafiltration of -12.8 L during hospitalization with good oxygenation noted overnight.  Next hemodialysis ordered for tomorrow. 2. ESRD: Usually on a Monday/Wednesday/Friday schedule at [facility] with next hemodialysis ordered for today. Last HD on 3/29 (3L UF). 3. Anemia: Continue ESA for management of anemia of chronic disease.  No overt loss noted. Aranesp 150 on 3/29 qweekly. 4. CKD-MBD: Calcium and phosphorus levels both at goal without phosphorus binders.  Will continue to follow trends. Recheck Wed. 5. Nutrition: Continue renal diet with fluid restriction; Na dropping from free water intake and will impr with HD today. 6. Hypertension: Blood pressure "soft" while on midodrine, monitor with hemodialysis and efforts at volume unloading/ultrafiltration.  Subjective:   No acute events overnight, palliative care consult notes reviewed with regards to establishing goals of care with patient now DNR/DNI. Pruritus but denies f/c/n/v/sob.   Objective:   BP (!) 134/57   Pulse 76   Temp 97.8 F (36.6 C) (Axillary)   Resp 16   Ht 4\' 3"  (1.295 m)   Wt 95 kg   SpO2 100%   BMI 56.61 kg/m   Physical Exam: Gen: Sitting up in bed.  Oxygen via nasal cannula CVS: Pulse regular rhythm, normal rate, S1 and S2 normal Resp: Coarse breath sounds bilaterally no distinct rales or rhonchi.  Right IJ TDC Abd: Soft, obese, nontender, bowel sounds normal Ext: Status post bilateral Rt AKA, Lt BKA, thrombosed accesses in LUA  Labs: BMET Recent Labs  Lab  06/29/22 0844 06/30/22 0145 06/30/22 1229 07/01/22 0008 07/01/22 0633 07/02/22 0138 07/03/22 0222 07/04/22 0133  NA 132* 131* 132* 129* 130* 134* 132* 128*  K 4.1 3.7 3.4* 3.3* 3.5 3.5 3.5 3.6  CL 92* 95* 90*  --  92* 96* 92* 92*  CO2 24 22 28   --  26 27 23 24   GLUCOSE 115* 147* 116*  --  107* 86 168* 115*  BUN 34* 22 14  --  18 15 27* 34*  CREATININE 4.92* 3.65* 2.27*  --  3.24* 3.10* 4.54* 5.58*  CALCIUM 9.8 8.9 9.1  --  9.2 9.3 9.2 9.2  PHOS 4.9* 3.6 2.3*  --  3.4 2.9 3.2  --    CBC Recent Labs  Lab 06/30/22 0145 07/01/22 0008 07/01/22 0645 07/03/22 0222 07/04/22 0133  WBC 11.9*  --  8.6 8.1 8.5  HGB 8.5* 9.2* 8.5* 8.2* 9.2*  HCT 26.7* 27.0* 27.1* 26.8* 30.2*  MCV 98.2  --  99.3 101.5* 101.0*  PLT 186  --  149* 119* 123*      Medications:     atorvastatin  40 mg Oral Daily   Chlorhexidine Gluconate Cloth  6 each Topical Daily   Chlorhexidine Gluconate Cloth  6 each Topical Q0600   clopidogrel  75 mg Oral QHS   darbepoetin (ARANESP) injection - DIALYSIS  150 mcg Subcutaneous Q Fri-1800   diclofenac Sodium  2 g Topical QID   gabapentin  300 mg Oral QHS   heparin  5,000 Units Subcutaneous Q8H   insulin aspart  0-5 Units Subcutaneous  QHS   insulin aspart  0-9 Units Subcutaneous TID WC   ipratropium-albuterol  3 mL Nebulization BID   melatonin  3 mg Oral QHS   midodrine  5 mg Oral TID WC   pantoprazole  40 mg Oral Daily   rifampin  300 mg Oral BID   sennosides  10 mL Per Tube BID   sorbitol  30 mL Oral Q0600

## 2022-07-04 NOTE — Progress Notes (Signed)
Following up regarding automated populated vasopressor consult. Educated Geneticist, molecular on USGPIV use for vasopressor. At this time he reported discontinuing vasopressor, and no further needs for USGPIV at this time. Fran Lowes, RN VAST

## 2022-07-04 NOTE — IPAL (Signed)
  Interdisciplinary Goals of Care Family Meeting   Date carried out: 07/04/2022  Location of the meeting: Phone conference  Member's involved: Family Member or next of kin and Other: PA-c  Durable Power of [employer] or acting medical decision maker: Son Social research officer, government    Discussion: We discussed goals of care for [facility].  Spoke with son Cristie Hem over phone. Updated that patient is continuing to have episodes of lethargy from hypercarbia and requiring bipap and is also intermittently refusing bipap. Son in agreement if patient continues to refuse bipap and continues to have worsening hypercarbic respiratory failure would like to transition to comfort care. Will continue supportive care for now.  Code status:   Code Status: DNR   Disposition: Continue current acute care  Time spent for the meeting: 35 minutes    Mick Sell, PA-C  07/04/2022, 5:40 PM

## 2022-07-04 NOTE — Procedures (Signed)
HD Note:  Some information was entered later than the data was gathered due to patient care needs. The stated time with the data is accurate.  Treatment done at bedside  Alert and oriented to self and situation Informed consent signed and in chart.   TX duration:3.75 hours  Patient BP was low at the beginning of treatment.  New orders received for medications.  Carried out, see MAR.   Approximately mid treatment, see flow sheet, the patient became disoriented.  I spoke to people who were not there.  Was unable to describe why he was uncomfortable, but was restless.  He would forget that his left arm was weak and would raise it and it would fall on him.  Patient remained disoriented for the rest of the treatment.      Hand-off given to patient's nurse.   Access used: Right upper chest HD catheter Access issues: Arterial lumen did not pull with initial set up.  Several flushes done.  BFR limit was 300 to allow treatment.  Total UF removed: 3000 ml    [facility]

## 2022-07-04 NOTE — Progress Notes (Signed)
Patient placed back on bipap due to decreasing in sats to 80s, confusion, and work of breathing.  Tolerating well at this time.  Will continue to monitor.

## 2022-07-04 NOTE — Progress Notes (Signed)
Received automated vasopressor consult. Secure chat sent to [facility] regarding use of vasopressor and need for USGPIV. Notified upper arm USGPIV not to be used for vasopressor. Clarifying need for USGPIV for  infusion of vasopressor. Secure chat seen by [employer]. Fran Lowes, RN VAST

## 2022-07-04 NOTE — Progress Notes (Signed)
[facility] Progress Note Patient Name: [name]. DOB: May 18, 1955 MRN: [id number]   Date of Service  07/04/2022  HPI/Events of Note  Patient itching really bad , no reports of rash  eICU Interventions  Benadryl 25 mg IV prn ordered x 1     Intervention Category Minor Interventions: Routine modifications to care plan (e.g. PRN medications for pain, fever)  Adria Costley T Brannan Cassedy 07/04/2022, 2:00 AM

## 2022-07-05 ENCOUNTER — Inpatient Hospital Stay (HOSPITAL_COMMUNITY): Payer: Medicare Other

## 2022-07-05 DIAGNOSIS — J9602 Acute respiratory failure with hypercapnia: Secondary | ICD-10-CM | POA: Diagnosis not present

## 2022-07-05 DIAGNOSIS — J9601 Acute respiratory failure with hypoxia: Secondary | ICD-10-CM | POA: Diagnosis not present

## 2022-07-05 DIAGNOSIS — I5020 Unspecified systolic (congestive) heart failure: Secondary | ICD-10-CM | POA: Diagnosis not present

## 2022-07-05 DIAGNOSIS — N186 End stage renal disease: Secondary | ICD-10-CM | POA: Diagnosis not present

## 2022-07-05 DIAGNOSIS — E8770 Fluid overload, unspecified: Secondary | ICD-10-CM | POA: Diagnosis not present

## 2022-07-05 LAB — BASIC METABOLIC PANEL
Anion gap: 17 — ABNORMAL HIGH (ref 5–15)
BUN: 22 mg/dL (ref 8–23)
CO2: 23 mmol/L (ref 22–32)
Calcium: 9.2 mg/dL (ref 8.9–10.3)
Chloride: 91 mmol/L — ABNORMAL LOW (ref 98–111)
Creatinine, Ser: 4.11 mg/dL — ABNORMAL HIGH (ref 0.61–1.24)
GFR, Estimated: 15 mL/min — ABNORMAL LOW (ref >60)
Glucose, Bld: 166 mg/dL — ABNORMAL HIGH (ref 70–99)
Potassium: 3.7 mmol/L (ref 3.5–5.1)
Sodium: 131 mmol/L — ABNORMAL LOW (ref 135–145)

## 2022-07-05 LAB — CBC
HCT: 30.8 % — ABNORMAL LOW (ref 39.0–52.0)
Hemoglobin: 9.5 g/dL — ABNORMAL LOW (ref 13.0–17.0)
MCH: 31.1 pg (ref 26.0–34.0)
MCHC: 30.8 g/dL (ref 30.0–36.0)
MCV: 101 fL — ABNORMAL HIGH (ref 80.0–100.0)
Platelets: 168 10*3/uL (ref 150–400)
RBC: 3.05 MIL/uL — ABNORMAL LOW (ref 4.22–5.81)
RDW: 18.1 % — ABNORMAL HIGH (ref 11.5–15.5)
WBC: 16.1 10*3/uL — ABNORMAL HIGH (ref 4.0–10.5)
nRBC: 0.5 % — ABNORMAL HIGH (ref 0.0–0.2)

## 2022-07-05 LAB — GLUCOSE, CAPILLARY
Glucose-Capillary: 178 mg/dL — ABNORMAL HIGH (ref 70–99)
Glucose-Capillary: 144 mg/dL — ABNORMAL HIGH (ref 70–99)
Glucose-Capillary: 123 mg/dL — ABNORMAL HIGH (ref 70–99)
Glucose-Capillary: 137 mg/dL — ABNORMAL HIGH (ref 70–99)
Glucose-Capillary: 119 mg/dL — ABNORMAL HIGH (ref 70–99)

## 2022-07-05 LAB — MAGNESIUM: Magnesium: 1.8 mg/dL (ref 1.7–2.4)

## 2022-07-05 MED ORDER — CEFAZOLIN SODIUM-DEXTROSE 1-4 GM/50ML-% IV SOLN
1.0000 g | INTRAVENOUS | Status: DC
  Administered 2022-07-05: 1 g via INTRAVENOUS
  Filled 2022-07-05: qty 50

## 2022-07-05 MED ORDER — ACETAMINOPHEN 650 MG RE SUPP
650.0000 mg | Freq: Four times a day (QID) | RECTAL | Status: DC
  Administered 2022-07-06 (×2): 650 mg via RECTAL
  Filled 2022-07-05 (×2): qty 1

## 2022-07-05 MED ORDER — MIDODRINE HCL 5 MG PO TABS
10.0000 mg | ORAL_TABLET | Freq: Three times a day (TID) | ORAL | Status: DC
  Administered 2022-07-05 (×2): 10 mg via ORAL
  Filled 2022-07-05 (×3): qty 2

## 2022-07-05 MED ORDER — OXYCODONE HCL 5 MG PO TABS
5.0000 mg | ORAL_TABLET | ORAL | Status: DC | PRN
  Administered 2022-07-05: 10 mg via ORAL
  Filled 2022-07-05 (×2): qty 2

## 2022-07-05 MED ORDER — LACTULOSE 10 GM/15ML PO SOLN
30.0000 g | Freq: Once | ORAL | Status: AC
  Administered 2022-07-05: 30 g via ORAL
  Filled 2022-07-05: qty 45

## 2022-07-05 MED ORDER — CEFAZOLIN SODIUM-DEXTROSE 2-4 GM/100ML-% IV SOLN: 2.0000 g | Freq: Three times a day (TID) | INTRAVENOUS | Status: DC

## 2022-07-05 MED ORDER — ACETAMINOPHEN 325 MG PO TABS
650.0000 mg | ORAL_TABLET | Freq: Four times a day (QID) | ORAL | Status: DC
  Administered 2022-07-05 – 2022-07-11 (×13): 650 mg via ORAL
  Filled 2022-07-05 (×17): qty 2

## 2022-07-05 MED ORDER — CEFAZOLIN SODIUM-DEXTROSE 2-4 GM/100ML-% IV SOLN: 2.0000 g | INTRAVENOUS | Status: DC

## 2022-07-05 NOTE — Progress Notes (Addendum)
00:14 Called charge [facility]) for IV watch monitor. She advised there were no more available on the floor. The last one was borrowed from a Peds unit. Lavell Islam, RN that inserted USGIV advised IV was inserted "deep," and an IV watch would not sensor an IV so deep.   Assessed site, it is free from redness and infiltration. Levo is currently going at 4 mcg.  02:00 assessed site free from redness and signs of infiltration. Pt reports no pain  04:38 assessed site free from redness and signs of infiltration. Levo is currently at 9 mcg. Elink RN notified  06:00 assessed site free from redness and signs of infiltration. Levo is currently at 10 mcg. Pound RN notified

## 2022-07-05 NOTE — Progress Notes (Signed)
IVT consult for vasopressor: Patient w/ USPIV on RUA currently in use. L arm restricted; assessed RLFA and can only find veins deeper than 1cm. Accessed RALF with 2.5 g20 IV cath with GBR. Informed Rn that IV watch will not be able to monitor for infiltration due to depth of cath if this uspiv is used for vasopressor infusion, recommend to continue w/ observation of site,physical assessment.

## 2022-07-05 NOTE — Progress Notes (Signed)
[facility] Progress Note Patient Name: [name]. DOB: December 08, 1955 MRN: [id number]   Date of Service  07/05/2022  HPI/Events of Note  Patient is unresponsive to sternal rub. Patient refused to wear BiPAP. Patient is currently a DNR. He is now back on BiPAP and his LOC is improving.  eICU Interventions  Plan: Continue present management.     Intervention Category Major Interventions: Change in mental status - evaluation and management  [name] Cornelia Copa 07/05/2022, 7:31 PM

## 2022-07-05 NOTE — Progress Notes (Signed)
Patient ID: Godfrey Pick., male   DOB: 23-Mar-1956, 67 y.o.   MRN: [id number]    Progress Note from the Palliative Medicine Team at [facility]   Patient Name: [name].        Date: 07/05/2022 DOB: October 06, 1955  Age: 67 y.o. MRN#: [id number] Attending Physician: Spero Geralds, MD Primary Care Physician: [name], MD Admit Date: 06/24/2022   Medical records reviewed    Palliative Care consult requested for goals of care discussion in this 67 y.o. male  with medical history significant for hypertension, IDDM, PAD, ESRD on HD (MWF), CHF, PAD s/p bilateral BKA, hyperlipidemia, and CVA. He was admitted from [place] [facility] center on 06/24/2022 with shortness of breath and bilateral pleural effusions. Chest x-ray showed volume overload. He was unable to complete scheduled dialysis due to this.  Initial goals of care meeting 07/02/2022  Overall failure to thrive, second hospitalization in the last 4 months.  Patient and family face treatment option decisions,Advanced directive decisions and anticipatory care needs.  This NP assessed patient at the bedside as follow-up for palliative medicine needs and emotional support..  Attending team asked follow-up PMT involvement.  Discussed with bedside nurse           Significant Hospital events:  Viral Panel 06/24/22 neg  Rocephin/Rifampin pre admit and continued 3/22 >>> MRSA PCR 3/23 neg  CTA 3/24 c/w CHF with edema and R > L effusions  Echo 3/25 >> LVEF 30-35%, moderate decreased LV function, +RWMAs, moderate LV dilation, no LV thrombus, normal RV function, trivial MR, mild AR 3/26 tx from [facility], intermittent BiPAP, iHD, abx, precedex 3/27 iHD 3.5L off 3/28 iHD 3L off, hypercarbic again, BiPAP 4/1 remains intermittently on bipap and refusing; required low dose levo after dialysis   Patient and family face treatment option decisions,Advanced directive decisions and anticipatory care needs.  I spoke with patient's son/ Mickle Asper by telephone  today.  He understands the seriousness of his father's current medical situation, he has no questions or concerns currently.  Plan is to meet in person tomorrow when he arrives at the bedside for ongoing education regarding current medical situation and further clarification of goals of care.   Education offered today regarding  the importance of continued conversation with family and the medical providers regarding overall plan of care and treatment options,  ensuring decisions are within the context of the patients values and GOCs.  Questions and concerns addressed     Time: 35 minutes  Detailed review of medical records, medically appropriate exam,    discussed with treatment team, counseling and education to patient, family, staff, documenting clinical information, medication management, coordination of care    Wadie Lessen NP  Palliative Medicine Team Team Phone # [phone] Pager [phone]

## 2022-07-05 NOTE — Progress Notes (Signed)
Pharmacy Antibiotic Note  [name]. is a 67 y.o. male admitted on 06/24/2022 with hospital acquired pneumonia s/p treatment with Cefepime and previous S. Lugdunesis bacteremia and endocarditis. Cefepime has ended and pharmacy requested to change back to Cefazolin through 4/5 per ID recommendations for  Pharmacy has been consulted for cefepime dosing. Currently on HD MWF - on schedule.   Plan: Change back to Cefazolin 1g IV q24h through 4/5 per ID recommendations.  Rifampin 300mg  PO BID per MD Stop date in place.  Pharmacy will sign off consult.    Height: 4\' 3"  (129.5 cm) Weight: 82.3 kg (181 lb 7 oz) IBW/kg (Calculated) : 29.3  Temp (24hrs), Avg:97.9 F (36.6 C), Min:97.2 F (36.2 C), Max:98.3 F (36.8 C)  Recent Labs  Lab 06/30/22 0145 06/30/22 1229 07/01/22 [id number] 07/01/22 0645 07/02/22 0138 07/03/22 0222 07/04/22 0133 07/05/22 0456  WBC 11.9*  --   --  8.6  --  8.1 8.5 16.1*  CREATININE 3.65*   < > 3.24*  --  3.10* 4.54* 5.58* 4.11*   < > = values in this interval not displayed.    Estimated Creatinine Clearance: 12.1 mL/min (A) (by C-G formula based on SCr of 4.11 mg/dL (H)).    Allergies  Allergen Reactions   Contrast Media [Iodinated Contrast Media] Hives and Other (See Comments)    Happened "in the 80's"   Other Other (See Comments)    "Transfer Dye" = "Makes me tired"   Acetazolamide Anxiety and Other (See Comments)    Jittery, odd feeling (hyper feeling)    Antimicrobials this admission: Cefazolin 2/21 >> 3/27; 4/2 >>4/5 (per ID recs) Rifampin 3/1 >>  Cefepime 3/27>>4/1  Microbiology results: 2/15 Bcx from [place]: pan sensitive staph lugdunesis  2/21 Bcx: NGTD 3/26 Bcx: NGTD  3/26 MRSA PCR: negative  Thank you for involving pharmacy in this patient's care.  Sloan Leiter, PharmD, BCPS, BCCCP Clinical Pharmacist Please refer to [facility] for [place] numbers 07/05/2022 9:23 AM

## 2022-07-05 NOTE — Progress Notes (Addendum)
NAME:  Hilery Waldbillig., MRN:  [id number], DOB:  1955/07/10, LOS: 9 ADMISSION DATE:  06/24/2022, CONSULTATION DATE:  06/27/2022 REFERRING MD:  Eliseo Squires [facility] CHIEF COMPLAINT: Acute respiratory distress    History of Present Illness:  31 yobm remote smoker HD pt with endocarditis/ Aortic insufficiency admtted to aph unable to complete HD 3/22 with evidence of vol overloadd / bilateral pl effusions on admit > bipap dependent resp failure and PCCM service requested eval am 3/25.    67 yobm with medical history significant of recent hospitalization with bacteremia,  ESRD on HD, DM.  Patient was brought in from [place] [facility] with c/o SOB.  He had over 1/2 of his HD remaining as he only completed 1 of his 4 hours on 3/22.  Patient denied chest pain, denies fevers, denies chills.  Patient reported he had fallen asleep and woke up coughing and was sent to the ER.  O2 sats were 95% on RA.     In the ER, x ray showed volume overload, elevated BNP, and low K.  ER doc spoke with renal MD who suggested admission and to finish his HD session  Pertinent Medical History:   Past Medical History:  Diagnosis Date   AICD (automatic cardioverter/defibrillator) present    Anemia    Arthritis    Cerebrovascular disease    CHF (congestive heart failure) (HCC)    Chronic kidney disease    ESRD, MWF HD   COVID 2022   moderate   Diabetes mellitus    type II   History of TIAs    Hyperlipidemia    Hypertension    Nonischemic cardiomyopathy (HCC)    PVD (peripheral vascular disease) (HCC)    s/p B BKA   Shortness of breath dyspnea    with exertion    Skin disease    Rare   Stroke ([place])    "light stroke"   Tobacco abuse    Significant Hospital Events: Including procedures, antibiotic start and stop dates in addition to other pertinent events   Viral Panel 06/24/22 neg  Rocephin/Rifampin pre admit and continued 3/22 >>> MRSA PCR 3/23 neg  CTA 3/24 c/w CHF with edema and R > L effusions  Echo 3/25 >> LVEF  30-35%, moderate decreased LV function, +RWMAs, moderate LV dilation, no LV thrombus, normal RV function, trivial MR, mild AR 3/26 tx from [facility], intermittent BiPAP, iHD, abx, precedex 3/27 iHD 3.5L off 3/28 iHD 3L off, hypercarbic again, BiPAP 4/1 remains intermittently on bipap and refusing; required low dose levo after dialysis  Interim History / Subjective:  Received dialysis yesterday and required low dose levo  Refused bipap last night Lethargic this am but arousable and able to conversate Attempted to put on bipap this am and patient pulled mask off after 5 minutes stating he doesn't like and makes him feel like he can't breathe. Discussed with patient that the bipap is keeping his co2 levels down and needs to wear it or he'll worsen. He states that he does not wear bipap.   Objective:  Blood pressure (!) 131/44, pulse 86, temperature 98.2 F (36.8 C), temperature source Oral, resp. rate 17, height 4\' 3"  (1.295 m), weight 82.3 kg, SpO2 100 %.    FiO2 (%):  [32 %-40 %] 32 %   Intake/Output Summary (Last 24 hours) at 07/05/2022 0721 Last data filed at 07/04/2022 1844 Gross per 24 hour  Intake 163.82 ml  Output 3000 ml  Net -2836.18 ml  Filed Weights   07/01/22 0945 07/01/22 1228 07/05/22 0307  Weight: 89 kg 95 kg 82.3 kg   Physical Examination: General:  ill appearing male on [place] HEENT: MM pink/moist; [place] in place Neuro: lethargic but arouses and Aox3; moves BUE to command CV: s1s2, RRR, no m/r/g PULM:  dim clear BS bilaterally; [place] 4L GI: soft, bsx4 active  Extremities: warm/dry, B/l BKA   Assessment & Plan:  Recurrent hypercarbia, noncompliance with BIPAP resulting in issues with hypercarbic delirium Suspected OHS/OSA Pleural effusion R>L Suspected HCAP P: -cont bipap qhs and prn if tolerated -wean [place] for sats >92% -cont HD for volume removal -pulm toiletry  HFrEF Chronic systolic heart failure P: -cont dialysis MWF per nephro -hold GMDT with soft BP -daily  weights  ESRD on HD Chronic hyponatremia P: -cont diuresis MWF per nephro -Trend BMP / urinary output -Replace electrolytes as indicated -Avoid nephrotoxic agents, ensure adequate renal perfusion  Endocarditis Staph lugdunsei aortic valve abscess, not surgical candidate P: -switch cefepime to ancef and cont rifampin per ID  Intermittent constipation P: -cont bowel regimen -lactulose x1 today  Renal vasoplegia P: -placed on low dose levo yesterday after dialysis; wean for map goal >65 -increase midodrine dose  Anemia of chronic ESRD Thrombocytopenia P: -trend cbc  Moderate protein calorie malnutrition POA P: -cont renal diet   Best Practice (right click and "Reselect all SmartList Selections" daily)  Diet/type: Regular consistency (see orders)> renal / heart healthy DVT prophylaxis: prophylactic heparin  GI prophylaxis: PPI Central venous access:  Yes, and it is still needed - HD catheter Foley:  N/A Code Status:  full DNR Last date of multidisciplinary goals of care discussion 4/1 see ipal; spoke with son today on 4/2 and trying to set up family meeting, he states he can't be there in person until tomorrow. He would like [place] to continue trying bipap for now.   CC time: 35 minutes  JD Rexene Agent [facility] 07/05/2022, 7:21 AM  Please see [email or web address] for pager details.  From 7A-7P if no response, please call [phone]. After hours, please call ELink [phone].

## 2022-07-05 NOTE — Progress Notes (Signed)
RT NOTE: patient placed on bipap due to being lethargic this AM and needing to wear bipap while sleeping.  Tolerating well at this time.  Will continue to monitor.

## 2022-07-05 NOTE — Progress Notes (Signed)
Patient ID: [name] Pick., male   DOB: 1955/06/19, 67 y.o.   MRN: [id number] [facility] Progress Note   Assessment/ Plan:   1.  Acute hypoxic respiratory failure: Suspected to be from a combination of volume overload (predominantly) and right lung pneumonia.  Records reviewed indicate hypotension has been limiting to ultrafiltration/attaining dry weight as an outpatient.  He has had daily dialysis for the last 3 days with net ultrafiltration of -15.7 L during hospitalization with good oxygenation noted overnight. We should try to get a true weight today; will ask nurse to use hoyer lift to zero out the bed.  2. ESRD: Usually on a Monday/Wednesday/Friday schedule at [facility]. HD on 3/29 (3L UF), HD 4/1 3L. Next HD WED on MWF regimen. 3. Anemia: Continue ESA for management of anemia of chronic disease.  No overt loss noted. Aranesp 150 on 3/29 qweekly. 4. CKD-MBD: Calcium and phosphorus levels both at goal without phosphorus binders.  Will continue to follow trends. Recheck Wed. 5. Nutrition: Continue renal diet with fluid restriction; Na dropping from free water intake and will impr with HD today. 6. Hypertension: Blood pressure "soft" while on midodrine, monitor with hemodialysis and efforts at volume unloading/ultrafiltration.  Subjective:   No acute events overnight, palliative care consult notes reviewed with regards to establishing goals of care with patient now DNR/DNI. Pruritus but denies f/c/n/v/sob. Tolerated HD yest   Objective:   BP (!) 131/44   Pulse 86   Temp 98.3 F (36.8 C) (Oral)   Resp 17   Ht 4\' 3"  (1.295 m)   Wt 82.3 kg   SpO2 100%   BMI 49.04 kg/m   Physical Exam: Gen: Sitting up in bed.  Oxygen via nasal cannula CVS: Pulse regular rhythm, normal rate, S1 and S2 normal Resp: Coarse breath sounds bilaterally no distinct rales or rhonchi.  Right IJ TDC Abd: Soft, obese, nontender, bowel sounds normal Ext: Status post bilateral Rt AKA, Lt  BKA, thrombosed accesses in LUA  Labs: BMET Recent Labs  Lab 06/29/22 0844 06/30/22 0145 06/30/22 1229 07/01/22 0008 07/01/22 [id number] 07/02/22 0138 07/03/22 0222 07/04/22 0133 07/05/22 0456  NA 132* 131* 132* 129* 130* 134* 132* 128* 131*  K 4.1 3.7 3.4* 3.3* 3.5 3.5 3.5 3.6 3.7  CL 92* 95* 90*  --  92* 96* 92* 92* 91*  CO2 24 22 28   --  26 27 23 24 23   GLUCOSE 115* 147* 116*  --  107* 86 168* 115* 166*  BUN 34* 22 14  --  18 15 27* 34* 22  CREATININE 4.92* 3.65* 2.27*  --  3.24* 3.10* 4.54* 5.58* 4.11*  CALCIUM 9.8 8.9 9.1  --  9.2 9.3 9.2 9.2 9.2  PHOS 4.9* 3.6 2.3*  --  3.4 2.9 3.2  --   --    CBC Recent Labs  Lab 07/01/22 0645 07/03/22 0222 07/04/22 0133 07/05/22 0456  WBC 8.6 8.1 8.5 16.1*  HGB 8.5* 8.2* 9.2* 9.5*  HCT 27.1* 26.8* 30.2* 30.8*  MCV 99.3 101.5* 101.0* 101.0*  PLT 149* 119* 123* 168      Medications:     aspirin  81 mg Oral QHS   atorvastatin  40 mg Oral Daily   Chlorhexidine Gluconate Cloth  6 each Topical Daily   Chlorhexidine Gluconate Cloth  6 each Topical Q0600   clopidogrel  75 mg Oral QHS   cyanocobalamin  1,000 mcg Oral Daily   darbepoetin (ARANESP) injection - DIALYSIS  150  mcg Subcutaneous Q Fri-1800   diclofenac Sodium  2 g Topical QID   folic acid  1 mg Oral QHS   gabapentin  300 mg Oral QHS   heparin  5,000 Units Subcutaneous Q8H   hydrocortisone cream   Topical BID   insulin aspart  0-5 Units Subcutaneous QHS   insulin aspart  0-9 Units Subcutaneous TID WC   ipratropium-albuterol  3 mL Nebulization BID   melatonin  3 mg Oral QHS   midodrine  10 mg Oral TID WC   pantoprazole  40 mg Oral Daily   rifampin  300 mg Oral BID   sennosides  10 mL Per Tube BID   sorbitol  30 mL Oral Q0600

## 2022-07-06 DIAGNOSIS — J9601 Acute respiratory failure with hypoxia: Secondary | ICD-10-CM | POA: Diagnosis not present

## 2022-07-06 DIAGNOSIS — E8770 Fluid overload, unspecified: Secondary | ICD-10-CM | POA: Diagnosis not present

## 2022-07-06 DIAGNOSIS — N186 End stage renal disease: Secondary | ICD-10-CM | POA: Diagnosis not present

## 2022-07-06 DIAGNOSIS — J9602 Acute respiratory failure with hypercapnia: Secondary | ICD-10-CM | POA: Diagnosis not present

## 2022-07-06 DIAGNOSIS — Z515 Encounter for palliative care: Secondary | ICD-10-CM | POA: Diagnosis not present

## 2022-07-06 DIAGNOSIS — I5020 Unspecified systolic (congestive) heart failure: Secondary | ICD-10-CM | POA: Diagnosis not present

## 2022-07-06 LAB — CBC
HCT: 29.2 % — ABNORMAL LOW (ref 39.0–52.0)
Hemoglobin: 9.2 g/dL — ABNORMAL LOW (ref 13.0–17.0)
MCH: 31.7 pg (ref 26.0–34.0)
MCHC: 31.5 g/dL (ref 30.0–36.0)
MCV: 100.7 fL — ABNORMAL HIGH (ref 80.0–100.0)
Platelets: 115 10*3/uL — ABNORMAL LOW (ref 150–400)
RBC: 2.9 MIL/uL — ABNORMAL LOW (ref 4.22–5.81)
RDW: 17.8 % — ABNORMAL HIGH (ref 11.5–15.5)
WBC: 8.6 10*3/uL (ref 4.0–10.5)
nRBC: 0.4 % — ABNORMAL HIGH (ref 0.0–0.2)

## 2022-07-06 LAB — BASIC METABOLIC PANEL
Anion gap: 15 (ref 5–15)
BUN: 26 mg/dL — ABNORMAL HIGH (ref 8–23)
CO2: 23 mmol/L (ref 22–32)
Calcium: 9.5 mg/dL (ref 8.9–10.3)
Chloride: 93 mmol/L — ABNORMAL LOW (ref 98–111)
Creatinine, Ser: 5.25 mg/dL — ABNORMAL HIGH (ref 0.61–1.24)
GFR, Estimated: 11 mL/min — ABNORMAL LOW (ref >60)
Glucose, Bld: 100 mg/dL — ABNORMAL HIGH (ref 70–99)
Potassium: 3.8 mmol/L (ref 3.5–5.1)
Sodium: 131 mmol/L — ABNORMAL LOW (ref 135–145)

## 2022-07-06 LAB — GLUCOSE, CAPILLARY
Glucose-Capillary: 83 mg/dL (ref 70–99)
Glucose-Capillary: 89 mg/dL (ref 70–99)

## 2022-07-06 MED ORDER — HYDROMORPHONE HCL 1 MG/ML IJ SOLN
1.0000 mg | INTRAMUSCULAR | Status: DC | PRN
  Administered 2022-07-06 – 2022-07-11 (×8): 1 mg via INTRAVENOUS
  Filled 2022-07-06 (×8): qty 1

## 2022-07-06 MED ORDER — LORAZEPAM 2 MG/ML IJ SOLN
1.0000 mg | INTRAMUSCULAR | Status: DC | PRN
  Administered 2022-07-07 – 2022-07-09 (×3): 1 mg via INTRAVENOUS
  Filled 2022-07-06 (×3): qty 1

## 2022-07-06 NOTE — Progress Notes (Signed)
   07/06/22 1200  Vitals  Temp 97.6 F (36.4 C)  Pulse Rate 77  Resp 19  BP (!) 141/59  SpO2 100 %  Oxygen Therapy  O2 Flow Rate (L/min) 3 L/min  Patient Activity (if Appropriate) In bed  Pulse Oximetry Type Continuous  Post Treatment  Dialyzer Clearance Clear  Duration of HD Treatment -hour(s) 3.75 hour(s)  Hemodialysis Intake (mL) 0 mL  Liters Processed 89.9  Fluid Removed (mL) 3000 mL  Tolerated HD Treatment Yes   Received patient in bed to unit.  Alert and oriented.  Informed consent signed and in chart.   TX duration:3.75hrs  Patient tolerated well.  Transported back to the room  Alert, without acute distress.  Hand-off given to patient's nurse.   Access used: [facility] Access issues: none  Total UF removed: 3L Medication(s) given: none    [name]

## 2022-07-06 NOTE — Progress Notes (Signed)
ICD to OFF per [facility] note.    [facility] [employer] [phone]

## 2022-07-06 NOTE — Progress Notes (Signed)
NAME:  [name] Amor., MRN:  [id number], DOB:  March 13, 1956, LOS: 45 ADMISSION DATE:  06/24/2022, CONSULTATION DATE:  06/27/2022 REFERRING MD:  Eliseo Squires [facility] CHIEF COMPLAINT: Acute respiratory distress    History of Present Illness:  43 yobm remote smoker HD pt with endocarditis/ Aortic insufficiency admtted to [facility] unable to complete HD 3/22 with evidence of vol overloadd / bilateral pl effusions on admit > bipap dependent resp failure and PCCM service requested eval am 3/25.    67 yobm with medical history significant of recent hospitalization with bacteremia,  ESRD on HD, DM.  Patient was brought in from [facility] with c/o SOB.  He had over 1/2 of his HD remaining as he only completed 1 of his 4 hours on 3/22.  Patient denied chest pain, denies fevers, denies chills.  Patient reported he had fallen asleep and woke up coughing and was sent to the ER.  O2 sats were 95% on RA.     In the ER, x ray showed volume overload, elevated BNP, and low K.  ER doc spoke with renal MD who suggested admission and to finish his HD session  Pertinent Medical History:   Past Medical History:  Diagnosis Date   AICD (automatic cardioverter/defibrillator) present    Anemia    Arthritis    Cerebrovascular disease    CHF (congestive heart failure) (HCC)    Chronic kidney disease    ESRD, MWF HD   COVID 2022   moderate   Diabetes mellitus    type II   History of TIAs    Hyperlipidemia    Hypertension    Nonischemic cardiomyopathy (HCC)    PVD (peripheral vascular disease) (HCC)    s/p B BKA   Shortness of breath dyspnea    with exertion    Skin disease    Rare   Stroke ([place])    "light stroke"   Tobacco abuse    Significant Hospital Events: Including procedures, antibiotic start and stop dates in addition to other pertinent events   Viral Panel 06/24/22 neg  Rocephin/Rifampin pre admit and continued 3/22 >>> MRSA PCR 3/23 neg  CTA 3/24 c/w CHF with edema and R > L effusions  Echo 3/25 >> LVEF  30-35%, moderate decreased LV function, +RWMAs, moderate LV dilation, no LV thrombus, normal RV function, trivial MR, mild AR 3/26 tx from [facility], intermittent BiPAP, iHD, abx, precedex 3/27 iHD 3.5L off 3/28 iHD 3L off, hypercarbic again, BiPAP 4/1 remains intermittently on bipap and refusing; required low dose levo after dialysis 4/2 weaned of levo 4/3 refused bipap overnight and more lethargic this am. family meeting today to discuss [place] moving forward  Interim History / Subjective:   Overnight more lethargic and placed on bipap and pulled mask off shortly after refusing bipap. Getting dialysis this am Lethargic not able to state name and follow commands Family meeting today   Objective:  Blood pressure (!) 114/46, pulse 79, temperature (!) 97.4 F (36.3 C), temperature source Axillary, resp. rate 11, height 4\' 3"  (1.295 m), weight 101.1 kg, SpO2 100 %.    FiO2 (%):  [36 %-40 %] 40 %   Intake/Output Summary (Last 24 hours) at 07/06/2022 0725 Last data filed at 07/05/2022 1900 Gross per 24 hour  Intake 120.51 ml  Output --  Net 120.51 ml    Filed Weights   07/01/22 1228 07/05/22 0307 07/06/22 0117  Weight: 95 kg 82.3 kg 101.1 kg   Physical Examination: General:  ill appearing male on [place] HEENT: MM pink/moist; [place] in place Neuro: lethargic; does arouse to voice but confused not able to state name or follow commands CV: s1s2, RRR, no m/r/g PULM:  dim clear BS bilaterally; [place] 4L GI: soft, bsx4 active  Extremities: warm/dry, B/l BKA   Assessment & Plan:  Recurrent hypercarbia, noncompliance with BIPAP resulting in issues with hypercarbic delirium Suspected OHS/OSA Pleural effusion R>L Suspected HCAP P: -family meeting today to discuss GOC -cont bipap qhs and prn if tolerated -wean [place] for sats >92% -cont HD for volume removal -pulm toiletry  HFrEF Chronic systolic heart failure P: -cont dialysis MWF per nephro -hold GMDT with soft BP -daily weights  ESRD on  HD Chronic hyponatremia P: -cont diuresis MWF per nephro -Trend BMP / urinary output -Replace electrolytes as indicated -Avoid nephrotoxic agents, ensure adequate renal perfusion  Endocarditis Staph lugdunsei aortic valve abscess, not surgical candidate P: -cont ancef and rifampin per ID  Intermittent constipation P: -cont bowel regimen  Renal vasoplegia P: -map goal >65 -cont midodrine dose  Anemia of chronic ESRD Thrombocytopenia P: -trend cbc  Moderate protein calorie malnutrition POA P: -cont renal diet   Best Practice (right click and "Reselect all SmartList Selections" daily)  Diet/type: Regular consistency (see orders)> renal / heart healthy DVT prophylaxis: prophylactic heparin  GI prophylaxis: PPI Central venous access:  Yes, and it is still needed - HD catheter Foley:  N/A Code Status:  full DNR Last date of multidisciplinary goals of care discussion 4/1 see ipal; spoke with son today on 4/2 and trying to set up family meeting, he states he can't be there in person until tomorrow. He would like [place] to continue trying bipap for now. 4/3 family to come in for [place] conversation later today   CC time: 35 minutes  JD Rexene Agent [facility] 07/06/2022, 7:25 AM  Please see [email or web address] for pager details.  From 7A-7P if no response, please call [phone]. After hours, please call ELink [phone].

## 2022-07-06 NOTE — Progress Notes (Signed)
Patient ID: [name] Pick., male   DOB: 1955/07/19, 67 y.o.   MRN: [id number] [facility] Progress Note   Assessment/ Plan:   1.  Acute hypoxic respiratory failure: Suspected to be from a combination of volume overload (predominantly) and right lung pneumonia.  Records reviewed indicate hypotension has been limiting to ultrafiltration/attaining dry weight as an outpatient.  He has had daily dialysis for the last 3 days with net ultrafiltration of -15.7 L during hospitalization with good oxygenation noted overnight. Doesn't appear that bed weights have been zeroed out with a hoyer; weights are all over.  2. ESRD: Usually on a Monday/Wednesday/Friday schedule at [facility]. HD on 3/29 (3L UF), HD 4/1 3L  Seen on HD 2K bath, goal UF 3L. Finally able to get him to take Midodrine given chronic hypotension even at outpt iHD. 3. Anemia: Continue ESA for management of anemia of chronic disease.  No overt loss noted. Aranesp 150 on 3/29 qweekly. 4. CKD-MBD: Calcium and phosphorus levels both at goal without phosphorus binders.  Will continue to follow trends. Recheck later in the week (Fri). 5. Nutrition: Continue renal diet with fluid restriction; Na dropping from free water intake and will impr with HD today. 6. Hypertension: Blood pressure "soft" while on midodrine, monitor with hemodialysis and efforts at volume unloading/ultrafiltration.  Subjective:   No acute events overnight, palliative care consult notes reviewed with regards to establishing goals of care with patient now DNR/DNI. Pruritus but denies f/c/n/v/sob. Tolerating HD    Objective:   BP (!) 124/46   Pulse 80   Temp 97.9 F (36.6 C)   Resp 18   Ht 4\' 3"  (1.295 m)   Wt 101.1 kg   SpO2 100%   BMI 60.25 kg/m   Physical Exam: Gen: Sitting up in bed.  Oxygen via nasal cannula CVS: Pulse regular rhythm, normal rate, S1 and S2 normal Resp: Coarse breath sounds bilaterally no distinct rales or rhonchi.   Abd:  Soft, obese, nontender, bowel sounds normal Ext: Status post bilateral Rt BKA (shorter stump), Lt BKA, thrombosed accesses in LUA Access: Right IJ TDC  Labs: BMET Recent Labs  Lab 06/30/22 0145 06/30/22 1229 07/01/22 0008 07/01/22 [id number] 07/02/22 0138 07/03/22 0222 07/04/22 0133 07/05/22 0456 07/06/22 0426  NA 131* 132* 129* 130* 134* 132* 128* 131* 131*  K 3.7 3.4* 3.3* 3.5 3.5 3.5 3.6 3.7 3.8  CL 95* 90*  --  92* 96* 92* 92* 91* 93*  CO2 22 28  --  26 27 23 24 23 23   GLUCOSE 147* 116*  --  107* 86 168* 115* 166* 100*  BUN 22 14  --  18 15 27* 34* 22 26*  CREATININE 3.65* 2.27*  --  3.24* 3.10* 4.54* 5.58* 4.11* 5.25*  CALCIUM 8.9 9.1  --  9.2 9.3 9.2 9.2 9.2 9.5  PHOS 3.6 2.3*  --  3.4 2.9 3.2  --   --   --    CBC Recent Labs  Lab 07/03/22 0222 07/04/22 0133 07/05/22 0456 07/06/22 0426  WBC 8.1 8.5 16.1* 8.6  HGB 8.2* 9.2* 9.5* 9.2*  HCT 26.8* 30.2* 30.8* 29.2*  MCV 101.5* 101.0* 101.0* 100.7*  PLT 119* 123* 168 115*      Medications:     acetaminophen  650 mg Oral Q6H   Or   acetaminophen  650 mg Rectal Q6H   aspirin  81 mg Oral QHS   atorvastatin  40 mg Oral Daily   Chlorhexidine Gluconate  Cloth  6 each Topical Daily   Chlorhexidine Gluconate Cloth  6 each Topical Q0600   clopidogrel  75 mg Oral QHS   cyanocobalamin  1,000 mcg Oral Daily   darbepoetin (ARANESP) injection - DIALYSIS  150 mcg Subcutaneous Q Fri-1800   diclofenac Sodium  2 g Topical QID   folic acid  1 mg Oral QHS   gabapentin  300 mg Oral QHS   heparin  5,000 Units Subcutaneous Q8H   hydrocortisone cream   Topical BID   insulin aspart  0-5 Units Subcutaneous QHS   insulin aspart  0-9 Units Subcutaneous TID WC   melatonin  3 mg Oral QHS   midodrine  10 mg Oral TID WC   pantoprazole  40 mg Oral Daily   rifampin  300 mg Oral BID   sennosides  10 mL Per Tube BID   sorbitol  30 mL Oral Q0600

## 2022-07-06 NOTE — TOC Progression Note (Addendum)
Transition of Care (TOC) - Initial/Assessment Note    Patient Details  Name: [name]. MRN: [id number] Date of Birth: 03/08/1956  Transition of Care [facility]) CM/SW Contact:    Milinda Antis, LCSWA Phone Number: 07/06/2022, 12:27 PM  Clinical Narrative:                 LCSW received a verbal consult for residential hospice from Wadie Lessen, NP with palliative care.  The family's facility choice is [facility] so that the patient can be closer to family.    LCSW contacted Larinda Buttery, admissions specialist at [facility] now [facility]  An RN from the facility will come and assess patient and will inform LCSW of the outcome.  TOC following.    Expected Discharge Plan: Skilled Nursing Facility Barriers to Discharge: Continued Medical Work up   Patient Goals and CMS Choice Patient states their goals for this hospitalization and ongoing recovery are:: return to Rehab CMS Medicare.gov Compare Post Acute Care list provided to:: Patient        Expected Discharge Plan and Services                                              Prior Living Arrangements/Services                       Activities of Daily Living Home Assistive Devices/Equipment: Wheelchair ADL Screening (condition at time of admission) Patient's cognitive ability adequate to safely complete daily activities?: Yes Is the patient deaf or have difficulty hearing?: No Does the patient have difficulty seeing, even when wearing glasses/contacts?: No Does the patient have difficulty concentrating, remembering, or making decisions?: No Patient able to express need for assistance with ADLs?: Yes Does the patient have difficulty dressing or bathing?: Yes Independently performs ADLs?: No Communication: Needs assistance Is this a change from baseline?: Pre-admission baseline Dressing (OT): Needs assistance Is this a change from baseline?: Pre-admission  baseline Grooming: Needs assistance Is this a change from baseline?: Pre-admission baseline Feeding: Needs assistance Is this a change from baseline?: Pre-admission baseline Bathing: Needs assistance Is this a change from baseline?: Pre-admission baseline Toileting: Needs assistance Is this a change from baseline?: Pre-admission baseline In/Out Bed: Dependent Is this a change from baseline?: Pre-admission baseline Walks in Home: Needs assistance Is this a change from baseline?: Pre-admission baseline Does the patient have difficulty walking or climbing stairs?: Yes Weakness of Legs: Both (bilateral amputee) Weakness of Arms/Hands: None  Permission Sought/Granted                  Emotional Assessment              Admission diagnosis:  Volume overload 123XX123 Systolic congestive heart failure, unspecified HF chronicity [I50.20] Patient Active Problem List   Diagnosis Date Noted   ESRD on dialysis 07/05/2022   Acute respiratory failure with hypercapnia 07/04/2022   HFrEF (heart failure with reduced ejection fraction) 07/04/2022   Goals of care, counseling/discussion 07/04/2022   Acute pulmonary edema 06/30/2022   Pleural effusion 06/30/2022   Pneumonia of right lower lobe due to infectious organism 06/30/2022   Acute hypercapnic respiratory failure 06/28/2022   Advanced care planning/counseling discussion 06/28/2022   Acute respiratory failure with hypoxia 06/28/2022   Class 1 obesity due to excess calories with body mass index (BMI)  of 33.0 to 33.9 in adult 05/28/2022   Bacteremia 05/24/2022   Kidney lesion, native, left 05/24/2022   Gynecomastia 0000000   Complication associated with dialysis catheter 05/25/2021   COVID-19 virus infection 05/25/2021   ESRD (end stage renal disease) on dialysis 02/18/2021   Anemia 09/15/2016   Abscess 09/09/2016   Abscess of buttock, right 09/08/2016   ESRD (end stage renal disease) 09/08/2016   S/P bilateral BKA (below  knee amputation) 09/08/2016   Anemia due to chronic kidney disease 09/08/2016   Chronic combined systolic (congestive) and diastolic (congestive) heart failure ([place]) 123XX123   Systolic congestive heart failure 06/19/2016   Acute hypoxemic respiratory failure 06/19/2016   Volume overload 06/19/2016   AKI (acute kidney injury) 06/19/2016   DM (diabetes mellitus), type 2 with peripheral vascular complications 0000000   SOB (shortness of breath)    ACHILLES BURSITIS OR TENDINITIS 09/14/2009   PLANTAR FACIITIS 09/14/2009   TIA 09/07/2009   Peripheral vascular disease ([place]) 01/07/2009   Hyperlipidemia 08/27/2008   Essential hypertension 08/27/2008   PCP:  [name], MD Pharmacy:   [facility], [address] AT [address] [place] [address] Phone: [phone] Fax: [phone]     Social Determinants of Health (SDOH) Social History: SDOH Screenings   Food Insecurity: Food Insecurity Present (06/24/2022)  Housing: Low Risk  (06/24/2022)  Transportation Needs: No Transportation Needs (06/24/2022)  Utilities: Not At Risk (06/24/2022)  Tobacco Use: Medium Risk (06/27/2022)   SDOH Interventions: Food Insecurity Interventions: Inpatient TOC Housing Interventions: Intervention Not Indicated Utilities Interventions: Intervention Not Indicated   Readmission Risk Interventions     No data to display

## 2022-07-06 NOTE — Progress Notes (Signed)
Patient ID: Godfrey Pick., male   DOB: [birth year], 67 y.o.   MRN: [id number]    Progress Note from the Palliative Medicine Team at [facility]   Patient Name: [name].        Date: 07/06/2022 DOB: 05-25-1955  Age: 67 y.o. MRN#: [id number] Attending Physician: Spero Geralds, MD Primary Care Physician: [name], MD Admit Date: 06/24/2022   Medical records reviewed    Palliative Care consult requested for goals of care discussion in this 67 y.o. male  with medical history significant for hypertension, IDDM, PAD, ESRD on HD (MWF), CHF, PAD s/p bilateral BKA, hyperlipidemia, and CVA. He was admitted from [facility] on 06/24/2022 with shortness of breath and bilateral pleural effusions. Chest x-ray showed volume overload. He was unable to complete scheduled dialysis due to this.  Initial goals of care meeting 07/02/2022  Overall failure to thrive, second hospitalization in the last 4 months.  Patient and family face treatment option decisions,Advanced directive decisions and anticipatory care needs.  This NP assessed patient at the bedside as follow-up for palliative medicine needs and emotional support..   Discussed with bedside nurse           Significant Hospital events:  Viral Panel 06/24/22 neg  Rocephin/Rifampin pre admit and continued 3/22 >>> MRSA PCR 3/23 neg  CTA 3/24 c/w CHF with edema and R > L effusions  Echo 3/25 >> LVEF 30-35%, moderate decreased LV function, +RWMAs, moderate LV dilation, no LV thrombus, normal RV function, trivial MR, mild AR 3/26 tx from [facility], intermittent BiPAP, iHD, abx, precedex 3/27 iHD 3.5L off 3/28 iHD 3L off, hypercarbic again, BiPAP 4/3 remains intermittently on bipap and refusing; required low dose levo after dialysis   Patient and family face treatment option decisions,  advanced directive decisions and anticipatory care needs.  I met with patient's son/ Mickle Asper and brother/ Roger at bedside.   Education offered on the the  seriousness of his father's current medical situation.  Both son and brother verbalized an understanding of the seriousness of patient's current medical situation and the likely associated  poor prognosis Family verbalize a sense of knowing the patient is tired.  He has been overall failing to thrive for some time.  His quality of life has been compromised.   Education offered on the difference between aggressive medical intervention path and a palliative comfort for this patient, at this time and in this situation.  I detailed the specifics of shifting comfort, family desire  that comfort and dignity be the focus  of care allowing for natural death  Plan of Care:    Focus of care is comfort DNR/DNI -No artificial feeding or hydration now or in the future -No further dialysis, diagnostics, lab draws, dc ICD -Symptom management to enhance comfort - MOST Form completed -Transition to residential hospice for end-of-life care, family is requesting [facility] offered today regarding  the importance of continued conversation with family and the medical providers regarding overall plan of care and treatment options,  ensuring decisions are within the context of the patients values and GOCs.  Questions and concerns addressed    Discussed with treatment team and bedside RN Time: 37 Minutes  Detailed review of medical records, medically appropriate exam,    discussed with treatment team, counseling and education to patient, family, staff, documenting clinical information, medication management, coordination of care    Wadie Lessen NP  Palliative Medicine Team Team Phone #  [place] Pager [phone]

## 2022-07-06 NOTE — Progress Notes (Signed)
PT Cancellation Note  Patient Details Name: [name]. MRN: [id number] DOB: Aug 04, 1955   Cancelled Treatment:    Reason Eval/Treat Not Completed: Medical issues which prohibited therapy (Nurse asked to [place] today as pt may transition to comfort care. Will check back at later date.)   [name] 07/06/2022, 10:22 AM [name],PT Acute Rehab Services [phone]

## 2022-07-07 DIAGNOSIS — J9602 Acute respiratory failure with hypercapnia: Secondary | ICD-10-CM | POA: Diagnosis not present

## 2022-07-07 DIAGNOSIS — J810 Acute pulmonary edema: Secondary | ICD-10-CM | POA: Diagnosis not present

## 2022-07-07 DIAGNOSIS — N186 End stage renal disease: Secondary | ICD-10-CM | POA: Diagnosis not present

## 2022-07-07 DIAGNOSIS — I5020 Unspecified systolic (congestive) heart failure: Secondary | ICD-10-CM | POA: Diagnosis not present

## 2022-07-07 NOTE — Progress Notes (Signed)
Called report to [facility], nurse on 2W. Pt going to 2w35. All belongings sent with patient.

## 2022-07-07 NOTE — Progress Notes (Signed)
NAME:  Leandrew Grams., MRN:  [id number], DOB:  [birth year], LOS: 57 ADMISSION DATE:  06/24/2022, CONSULTATION DATE:  06/27/2022 REFERRING MD:  Eliseo Squires [facility] CHIEF COMPLAINT: Acute respiratory distress    History of Present Illness:  46 yobm remote smoker HD pt with endocarditis/ Aortic insufficiency admtted to aph unable to complete HD 3/22 with evidence of vol overloadd / bilateral pl effusions on admit > bipap dependent resp failure and PCCM service requested eval am 3/25.    67 yobm with medical history significant of recent hospitalization with bacteremia,  ESRD on HD, DM.  Patient was brought in from [facility] with c/o SOB.  He had over 1/2 of his HD remaining as he only completed 1 of his 4 hours on 3/22.  Patient denied chest pain, denies fevers, denies chills.  Patient reported he had fallen asleep and woke up coughing and was sent to the ER.  O2 sats were 95% on RA.     In the ER, x ray showed volume overload, elevated BNP, and low K.  ER doc spoke with renal MD who suggested admission and to finish his HD session  Pertinent Medical History:   Past Medical History:  Diagnosis Date   AICD (automatic cardioverter/defibrillator) present    Anemia    Arthritis    Cerebrovascular disease    CHF (congestive heart failure) (HCC)    Chronic kidney disease    ESRD, MWF HD   COVID 2022   moderate   Diabetes mellitus    type II   History of TIAs    Hyperlipidemia    Hypertension    Nonischemic cardiomyopathy (HCC)    PVD (peripheral vascular disease) (HCC)    s/p B BKA   Shortness of breath dyspnea    with exertion    Skin disease    Rare   Stroke ([place])    "light stroke"   Tobacco abuse    Significant Hospital Events: Including procedures, antibiotic start and stop dates in addition to other pertinent events   Viral Panel 06/24/22 neg  Rocephin/Rifampin pre admit and continued 3/22 >>> MRSA PCR 3/23 neg  CTA 3/24 c/w CHF with edema and R > L effusions  Echo 3/25 >> LVEF  30-35%, moderate decreased LV function, +RWMAs, moderate LV dilation, no LV thrombus, normal RV function, trivial MR, mild AR 3/26 tx from [facility], intermittent BiPAP, iHD, abx, precedex 3/27 iHD 3.5L off 3/28 iHD 3L off, hypercarbic again, BiPAP 4/1 remains intermittently on bipap and refusing; required low dose levo after dialysis 4/2 weaned of levo 4/3 refused bipap overnight and more lethargic this am. family meeting today to discuss [facility] moving forward 4/3 transitioned to comfort care 4/4 transferring out of ICU  Interim History / Subjective:  Transitioned to comfort care 4/4 per family request. Transferring out of ICU today 4/5. Awaiting bed placement ideally at [facility].   Objective:  Blood pressure (!) 94/54, pulse 74, temperature (!) 97.5 F (36.4 C), temperature source Oral, resp. rate 11, height 4\' 3"  (1.295 m), weight 101.1 kg, SpO2 100 %.        Intake/Output Summary (Last 24 hours) at 07/07/2022 0910 Last data filed at 07/06/2022 1200 Gross per 24 hour  Intake --  Output 3000 ml  Net -3000 ml    Filed Weights   07/05/22 0307 07/06/22 0117 07/07/22 0100  Weight: 82.3 kg 101.1 kg 101.1 kg   Physical Examination: General: Chronically ill appearing male, resting in bed, in  NAD. Neuro: Somonlent but does open eyes to voice. HEENT: [place]/AT. Sclerae anicteric. MM dry. Cardiovascular: RRR, no M/R/G.  Lungs: Respirations even and unlabored.  CTA bilaterally, No W/R/R. Abdomen: Obese. BS x 4, soft, NT/ND.  Musculoskeletal: Bilateral BKA's. Skin: Intact, warm.  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 Pt awaiting bed [facility] at [facility] - No further HD/interventions given transition to comfort measures. - Transfer out of ICU while awaiting bed placement at [facility], [facility] is assisting with this.    Montey Hora, [facility] For pager details, please see AMION or use Epic chat  After 1900, please call [place] for cross coverage needs 07/07/2022, 9:21 AM

## 2022-07-07 NOTE — Plan of Care (Signed)

## 2022-07-07 NOTE — Progress Notes (Signed)
Difficulty with detecting pulse ox monitoring despite changing sensor at multiple sites. He also continues to pull monitoring wires off as soon as the wires are reattached on him. 1 mg of Ativan given per order.

## 2022-07-07 NOTE — TOC Progression Note (Addendum)
Transition of Care (TOC) - Initial/Assessment Note    Patient Details  Name: [name]. MRN: [id number] Date of Birth: 1955-12-07  Transition of Care [facility]) CM/SW Contact:    Milinda Antis, LCSWA Phone Number: 07/07/2022, 9:08 AM  Clinical Narrative:                 LCSW contacted [facility] in admissions for [facility]).  The facility cannot accept the patient into residential hospice as long as patient is receiving HD.    The facility reports speaking with family and informing them of the above.  The family plans to speak with the patient about next steps.   Care Team notified.  09:15-  LCSW informed by palliative NP that the patient will no longer be receiving HD.  LCSW contacted the facility and provided update.  The facility does not have a bed available today, but will plan to have an RN from facility evaluate the patient tomorrow for possible admit.  TOC following.   Expected Discharge Plan: Skilled Nursing Facility Barriers to Discharge: Continued Medical Work up   Patient Goals and CMS Choice Patient states their goals for this hospitalization and ongoing recovery are:: return to Rehab CMS Medicare.gov Compare Post Acute Care list provided to:: Patient        Expected Discharge Plan and Services                                              Prior Living Arrangements/Services                       Activities of Daily Living Home Assistive Devices/Equipment: Wheelchair ADL Screening (condition at time of admission) Patient's cognitive ability adequate to safely complete daily activities?: Yes Is the patient deaf or have difficulty hearing?: No Does the patient have difficulty seeing, even when wearing glasses/contacts?: No Does the patient have difficulty concentrating, remembering, or making decisions?: No Patient able to express need for assistance with ADLs?: Yes Does the patient have difficulty dressing or  bathing?: Yes Independently performs ADLs?: No Communication: Needs assistance Is this a change from baseline?: Pre-admission baseline Dressing (OT): Needs assistance Is this a change from baseline?: Pre-admission baseline Grooming: Needs assistance Is this a change from baseline?: Pre-admission baseline Feeding: Needs assistance Is this a change from baseline?: Pre-admission baseline Bathing: Needs assistance Is this a change from baseline?: Pre-admission baseline Toileting: Needs assistance Is this a change from baseline?: Pre-admission baseline In/Out Bed: Dependent Is this a change from baseline?: Pre-admission baseline Walks in Home: Needs assistance Is this a change from baseline?: Pre-admission baseline Does the patient have difficulty walking or climbing stairs?: Yes Weakness of Legs: Both (bilateral amputee) Weakness of Arms/Hands: None  Permission Sought/Granted                  Emotional Assessment              Admission diagnosis:  Volume overload 123XX123 Systolic congestive heart failure, unspecified HF chronicity [I50.20] Patient Active Problem List   Diagnosis Date Noted   ESRD on dialysis 07/05/2022   Acute respiratory failure with hypercapnia 07/04/2022   HFrEF (heart failure with reduced ejection fraction) 07/04/2022   Goals of care, counseling/discussion 07/04/2022   Acute pulmonary edema 06/30/2022   Pleural effusion 06/30/2022   Pneumonia of right  lower lobe due to infectious organism 06/30/2022   Acute hypercapnic respiratory failure 06/28/2022   Advanced care planning/counseling discussion 06/28/2022   Acute respiratory failure with hypoxia 06/28/2022   Class 1 obesity due to excess calories with body mass index (BMI) of 33.0 to 33.9 in adult 05/28/2022   Bacteremia 05/24/2022   Kidney lesion, native, left 05/24/2022   Gynecomastia 0000000   Complication associated with dialysis catheter 05/25/2021   COVID-19 virus infection  05/25/2021   ESRD (end stage renal disease) on dialysis 02/18/2021   Anemia 09/15/2016   Abscess 09/09/2016   Abscess of buttock, right 09/08/2016   ESRD (end stage renal disease) 09/08/2016   S/P bilateral BKA (below knee amputation) 09/08/2016   Anemia due to chronic kidney disease 09/08/2016   Chronic combined systolic (congestive) and diastolic (congestive) heart failure ([place]) 123XX123   Systolic congestive heart failure 06/19/2016   Acute hypoxemic respiratory failure 06/19/2016   Volume overload 06/19/2016   AKI (acute kidney injury) 06/19/2016   DM (diabetes mellitus), type 2 with peripheral vascular complications 0000000   SOB (shortness of breath)    ACHILLES BURSITIS OR TENDINITIS 09/14/2009   PLANTAR FACIITIS 09/14/2009   TIA 09/07/2009   Peripheral vascular disease ([place]) 01/07/2009   Hyperlipidemia 08/27/2008   Essential hypertension 08/27/2008   PCP:  [name], MD Pharmacy:   [place], [address] [place] [address] Phone: [phone] Fax: [phone]     Social Determinants of Health (SDOH) Social History: SDOH Screenings   Food Insecurity: Food Insecurity Present (06/24/2022)  Housing: Low Risk  (06/24/2022)  Transportation Needs: No Transportation Needs (06/24/2022)  Utilities: Not At Risk (06/24/2022)  Tobacco Use: Medium Risk (06/27/2022)   SDOH Interventions: Food Insecurity Interventions: Inpatient TOC Housing Interventions: Intervention Not Indicated Utilities Interventions: Intervention Not Indicated   Readmission Risk Interventions     No data to display

## 2022-07-07 NOTE — Progress Notes (Signed)
Patient ID: [name] Pick., male   DOB: 1955-09-24, 67 y.o.   MRN: [id number] [facility] Progress Note   Assessment/ Plan:   1.  Acute hypoxic respiratory failure: Suspected to be from a combination of volume overload (predominantly) and right lung pneumonia.  Records reviewed indicate hypotension has been limiting to ultrafiltration/attaining dry weight as an outpatient.  He has had daily dialysis for the last 3 days with net ultrafiltration of -15.7 L during hospitalization with good oxygenation noted overnight. Doesn't appear that bed weights have been zeroed out with a hoyer; weights are all over.  2. ESRD: Usually on a Monday/Wednesday/Friday schedule at [facility]. HD on 3/29 (3L UF), HD 4/1 3L, 4/3 3L  No absolute indication for RRT and the patient appears to be  comfortable. Plan next HD for Fri.  3. Anemia: Continue ESA for management of anemia of chronic disease.  No overt loss noted. Aranesp 150 on 3/29 qweekly. 4. CKD-MBD: Calcium and phosphorus levels both at goal without phosphorus binders.  Will continue to follow trends. Recheck later in the week (Fri). 5. Nutrition: Continue renal diet with fluid restriction; Na dropping from free water intake and will impr with HD today. 6. Hypertension: Blood pressure "soft" while on midodrine, monitor with hemodialysis and efforts at volume unloading/ultrafiltration.  Subjective:   No acute events overnight, palliative care consult notes reviewed with regards to establishing goals of care with patient now DNR/DNI. Pruritus but denies f/c/n/v/sob.    Objective:   BP (!) 94/54 (BP Location: Right Arm)   Pulse 74   Temp (!) 97.5 F (36.4 C) (Oral)   Resp 11   Ht 4\' 3"  (1.295 m)   Wt 101.1 kg   SpO2 100%   BMI 60.25 kg/m   Physical Exam: Gen: Sitting up in bed, not really interacting effectively but arousable.  Oxygen via nasal cannula CVS: Pulse regular rhythm, normal rate, S1 and S2 normal Resp: Coarse breath  sounds bilaterally no distinct rales or rhonchi.   Abd: Soft, obese, nontender, bowel sounds normal Ext: Status post bilateral Rt BKA (shorter stump), Lt BKA, thrombosed accesses in LUA Access: Right IJ TDC  Labs: BMET Recent Labs  Lab 06/30/22 1229 07/01/22 0008 07/01/22 [id number] 07/02/22 0138 07/03/22 0222 07/04/22 0133 07/05/22 0456 07/06/22 0426  NA 132* 129* 130* 134* 132* 128* 131* 131*  K 3.4* 3.3* 3.5 3.5 3.5 3.6 3.7 3.8  CL 90*  --  92* 96* 92* 92* 91* 93*  CO2 28  --  26 27 23 24 23 23   GLUCOSE 116*  --  107* 86 168* 115* 166* 100*  BUN 14  --  18 15 27* 34* 22 26*  CREATININE 2.27*  --  3.24* 3.10* 4.54* 5.58* 4.11* 5.25*  CALCIUM 9.1  --  9.2 9.3 9.2 9.2 9.2 9.5  PHOS 2.3*  --  3.4 2.9 3.2  --   --   --    CBC Recent Labs  Lab 07/03/22 0222 07/04/22 0133 07/05/22 0456 07/06/22 0426  WBC 8.1 8.5 16.1* 8.6  HGB 8.2* 9.2* 9.5* 9.2*  HCT 26.8* 30.2* 30.8* 29.2*  MCV 101.5* 101.0* 101.0* 100.7*  PLT 119* 123* 168 115*      Medications:     acetaminophen  650 mg Oral Q6H   Or   acetaminophen  650 mg Rectal Q6H   Chlorhexidine Gluconate Cloth  6 each Topical Daily   Chlorhexidine Gluconate Cloth  6 each Topical Q0600   diclofenac  Sodium  2 g Topical QID   hydrocortisone cream   Topical BID

## 2022-07-08 DIAGNOSIS — I330 Acute and subacute infective endocarditis: Secondary | ICD-10-CM

## 2022-07-08 DIAGNOSIS — D696 Thrombocytopenia, unspecified: Secondary | ICD-10-CM | POA: Insufficient documentation

## 2022-07-08 DIAGNOSIS — I38 Endocarditis, valve unspecified: Secondary | ICD-10-CM

## 2022-07-08 DIAGNOSIS — E440 Moderate protein-calorie malnutrition: Secondary | ICD-10-CM | POA: Insufficient documentation

## 2022-07-08 DIAGNOSIS — E871 Hypo-osmolality and hyponatremia: Secondary | ICD-10-CM | POA: Insufficient documentation

## 2022-07-08 DIAGNOSIS — E662 Morbid (severe) obesity with alveolar hypoventilation: Secondary | ICD-10-CM | POA: Insufficient documentation

## 2022-07-08 NOTE — Progress Notes (Signed)
  Progress Note   Patient: [name]. [id number] DOB: 1955-05-01 DOA: 06/24/2022     12 DOS: the patient was seen and examined on 07/08/2022        Brief hospital course: Mr. Bollenbach is a 67 y.o. M with ESRD on HD MWF, DM, and CoNS bacteremia complicated by unremovable AICD and possible aortic root abscess also not a candidate for surgical excision who presented from HD due to hypotension.  Despite dialysis and substantial net negative fluid removal with serial dialysis, the patient's respiratory failure did not improve and his mentation worsened.    Family discussed goals of care with the CCM medical team an the decision was made to halt dialysis and allow a natural course.     Assessment and Plan: Principal Problem:   Staphylococcus lugdunensi aortic valve abscess Active Problems:   Hyperlipidemia   Peripheral vascular disease (HCC)   DM (diabetes mellitus), type 2 with peripheral vascular complications   ESRD (end stage renal disease)   Bacteremia   Morbid obesity   Acute respiratory failure with hypoxia and hypercapnia   Pleural effusion   Pneumonia of right lower lobe due to infectious organism   Acute on chronic systolic CHF (congestive heart failure)   Obesity hypoventilation syndrome   Hyponatremia   Thrombocytopenia   Protein-calorie malnutrition, moderate   Endocarditis    - Continue Ativan PRN - Continue Dilaudid PRN - Continue ondansetron PRN - Consult Hospice, pending transition to residential hospice           Subjective: partially delirious, no fever, no specific pain complaints.     Physical Exam: BP (!) 122/51 (BP Location: Left Arm)   Pulse 77   Temp (!) 97.4 F (36.3 C) (Oral)   Resp 18   Ht 4\' 3"  (1.295 m)   Wt 101.1 kg   SpO2 95%   BMI 60.25 kg/m   Chronically ill appearing adult male, lying in bed, no acute distress, tired Speech is rambling and hard to understand, talking a lot about recent dreams he has had, which run  together with his experiences during this hospitalization perhaps? RRR no murmurs actually, that I appreciate, no peripheral edema Bilateral AKA     Data Reviewed: No new  Family Communication: Cousin at bedside    Disposition: Status is: Inpatient Pending Hospice bed.        Author: Alberteen Sam, MD 07/08/2022 5:12 PM  For on call review [email or web address].

## 2022-07-08 NOTE — Care Management Important Message (Signed)
Important Message  Patient Details  Name: [name]. MRN: [id number] Date of Birth: 1955/05/21   Medicare Important Message Given:  Yes     Cassanda Walmer 07/08/2022, 3:01 PM

## 2022-07-08 NOTE — Hospital Course (Signed)
[name] is a 67 y.o. M with ESRD on HD MWF, DM, and CoNS bacteremia complicated by unremovable AICD and possible aortic root abscess also not a candidate for surgical excision who presented from HD due to hypotension.  Despite dialysis and substantial net negative fluid removal with serial dialysis, the patient's respiratory failure did not improve and his mentation worsened.    Family discussed goals of care with the CCM medical team an the decision was made to halt dialysis and allow a natural course.

## 2022-07-09 DIAGNOSIS — I330 Acute and subacute infective endocarditis: Secondary | ICD-10-CM | POA: Diagnosis not present

## 2022-07-09 NOTE — Progress Notes (Signed)
  Progress Note   Patient: [name]. [id number] DOB: January 19, 1956 DOA: 06/24/2022     13 DOS: the patient was seen and examined on 07/09/2022        Brief hospital course: [name] is a 67 y.o. M with ESRD on HD MWF, DM, and CoNS bacteremia complicated by unremovable AICD and possible aortic root abscess also not a candidate for surgical excision who presented from HD due to hypotension.  Despite dialysis and substantial net negative fluid removal with serial dialysis, the patient's respiratory failure did not improve and his mentation worsened.    Family discussed goals of care with the CCM medical team an the decision was made to halt dialysis and allow a natural course.     Assessment and Plan: Principal Problem:   Staphylococcus lugdunensi aortic valve abscess Active Problems:   Hyperlipidemia   Peripheral vascular disease (HCC)   DM (diabetes mellitus), type 2 with peripheral vascular complications   ESRD (end stage renal disease)   Bacteremia   Morbid obesity   Acute respiratory failure with hypoxia and hypercapnia   Pleural effusion   Pneumonia of right lower lobe due to infectious organism   Acute on chronic systolic CHF (congestive heart failure)   Obesity hypoventilation syndrome   Hyponatremia   Thrombocytopenia   Protein-calorie malnutrition, moderate   Endocarditis    - Continue Dilaudid, Ativan PRN - Continue ondansetron PRN - Consult Hospice, pending transition to residential hospice           Subjective: Sitting up in bed, would like underwear.  Has headache.       Physical Exam: BP (!) 146/84   Pulse 85   Temp 97.8 F (36.6 C) (Oral)   Resp 18   Ht 4\' 3"  (1.295 m)   Wt 101.1 kg   SpO2 100%   BMI 60.25 kg/m   Chronically ill-appearing adult male, bilateral AKA, sitting up in bed, watching television RRR, no murmurs, no peripheral edema Respiratory rate normal, lungs without rales or wheezes Somewhat confusing questions        Data Reviewed: No new  Family Communication: None present    Disposition: Status is: Inpatient Pending Hospice bed.        Author: Alberteen Sam, MD 07/09/2022 1:19 PM  For on call review [email or web address].

## 2022-07-10 DIAGNOSIS — I330 Acute and subacute infective endocarditis: Secondary | ICD-10-CM | POA: Diagnosis not present

## 2022-07-10 NOTE — Progress Notes (Signed)
  Progress Note   Patient: [name]. [id number] DOB: 1956-02-27 DOA: 06/24/2022     14 DOS: the patient was seen and examined on 07/10/2022        Brief hospital course: [name] is a 66 y.o. M with ESRD on HD MWF, DM, and CoNS bacteremia complicated by unremovable AICD and possible aortic root abscess also not a candidate for surgical excision who presented from HD due to hypotension.       Assessment and Plan: Principal Problem:   Staphylococcus lugdunensi aortic valve abscess Active Problems:   Hyperlipidemia   Peripheral vascular disease (HCC)   DM (diabetes mellitus), type 2 with peripheral vascular complications   ESRD (end stage renal disease)   Bacteremia   Morbid obesity   Acute respiratory failure with hypoxia and hypercapnia   Pleural effusion   Pneumonia of right lower lobe due to infectious organism   Acute on chronic systolic CHF (congestive heart failure)   Obesity hypoventilation syndrome   Hyponatremia   Thrombocytopenia   Protein-calorie malnutrition, moderate   Endocarditis    - Continue Dilaudid, Ativan PRN - Continue ondansetron PRN - Consult Hospice, pending transition to residential hospice           Subjective: No complaints today, no nursing concerns.       Physical Exam: BP (!) 134/46 (BP Location: Right Arm)   Pulse 75   Temp (!) 97.4 F (36.3 C)   Resp 18   Ht 4\' 3"  (1.295 m)   Wt 101.1 kg   SpO2 (!) 83%   BMI 60.25 kg/m   Chronically ill-appearing adult male, bilateral AKA, lying in bed, sleeping, sluggishly arousable     Data Reviewed: No new  Family Communication: None present    Disposition: Status is: Inpatient Pending Hospice bed.        Author: Alberteen Sam, MD 07/10/2022 5:14 PM  For on call review [email or web address].

## 2022-07-11 ENCOUNTER — Inpatient Hospital Stay (HOSPITAL_COMMUNITY): Payer: Medicare Other

## 2022-07-11 DIAGNOSIS — Z515 Encounter for palliative care: Secondary | ICD-10-CM

## 2022-07-11 DIAGNOSIS — I330 Acute and subacute infective endocarditis: Secondary | ICD-10-CM | POA: Diagnosis not present

## 2022-07-11 HISTORY — PX: IR REMOVAL TUN CV CATH W/O FL: IMG2289

## 2022-07-11 MED ORDER — LIDOCAINE HCL 1 % IJ SOLN
INTRAMUSCULAR | Status: AC
  Filled 2022-07-11: qty 20

## 2022-07-11 MED ORDER — LORAZEPAM 2 MG/ML IJ SOLN: 1.0000 mg | INTRAMUSCULAR | 0 refills | Status: AC | PRN

## 2022-07-11 MED ORDER — LIDOCAINE HCL 1 % IJ SOLN
10.0000 mL | Freq: Once | INTRAMUSCULAR | Status: DC
  Filled 2022-07-11: qty 10

## 2022-07-11 MED ORDER — ACETAMINOPHEN 325 MG PO TABS: 650.0000 mg | ORAL_TABLET | Freq: Four times a day (QID) | ORAL | Status: AC

## 2022-07-11 MED ORDER — BISACODYL 10 MG RE SUPP: 10.0000 mg | Freq: Every day | RECTAL | 0 refills | Status: AC | PRN

## 2022-07-11 MED ORDER — SALINE SPRAY 0.65 % NA SOLN: 1.0000 | NASAL | 0 refills | Status: AC | PRN

## 2022-07-11 MED ORDER — HYDROMORPHONE HCL 1 MG/ML IJ SOLN: 1.0000 mg | INTRAMUSCULAR | 0 refills | Status: AC | PRN

## 2022-07-11 MED ORDER — CAMPHOR-MENTHOL 0.5-0.5 % EX LOTN: TOPICAL_LOTION | CUTANEOUS | 0 refills | Status: AC | PRN

## 2022-07-11 MED ORDER — ALBUTEROL SULFATE (2.5 MG/3ML) 0.083% IN NEBU: 2.5000 mg | INHALATION_SOLUTION | RESPIRATORY_TRACT | 12 refills | Status: AC | PRN

## 2022-07-11 MED ORDER — ONDANSETRON HCL 4 MG PO TABS: 4.0000 mg | ORAL_TABLET | Freq: Four times a day (QID) | ORAL | 0 refills | Status: AC | PRN

## 2022-07-11 NOTE — Progress Notes (Signed)
Contacted [facility] to advise clinic staff that pt has been made comfort care and pt to transition to hospice home today. Clinic requested that d/c summary be faxed to clinic in order to be provide to pt's primary nephrologist. D/C summary faxed to clinic this afternoon.   Olivia Canter Renal Navigator [phone]

## 2022-07-11 NOTE — Progress Notes (Signed)
Report called to receiving nurse, Luanna Salk at [facility]. Awaiting [facility] to transport patient.

## 2022-07-11 NOTE — TOC Transition Note (Addendum)
Transition of Care [facility]) - CM/SW Discharge Note   Patient Details  Name: [name]. MRN: [id number] Date of Birth: 02/08/1956  Transition of Care [facility]) CM/SW Contact:  Janae Bridgeman, RN Phone Number: 07/11/2022, 12:24 PM   Clinical Narrative:    CM called and spoke with RNCM at [facility] facility and the patient has been accepted at the facility and bed availability was confirmed.  Hospice facility will set up transport once ICD has been confirmed to be turned off per bedside nursing - progress note from [facility] noted on 07/06/22.  Attending MD was made aware of bed opening today and discharge summary and orders will be completed.  I will reach back out to [facility] to arranged [facility] transportation set up.  DNR to be signed by the attending.  The patient's family is aware of bed opening today.  Bedside nursing - please call report to [facility] facility at [phone].  07/11/2022 1341 - CM called and spoke with bedside nursing and [facility] confirmed that ICD has been turned off.  Gertie Exon is requesting tunneled Right IJ be removed prior to discharge to the hospice facility.  Dr. Maryfrances Bunnell spoke with IR to have the catheter removed today.  I called and left a message with Delfina Redwood, RNCM at the facility to update that patient will likely be able to discharge to the facility this afternoon once the HD catheter is removed.  07/11/22 1400 - I called and spoke with Delfina Redwood, RNCM at [facility] hospice facility and they are able to admit the patient after his tunneled catheter is moved this afternoon.    Bedside nursing - please call [facility] at [phone] when patient returns from having tunneled CVL removed.     Barriers to Discharge: Continued Medical Work up   Patient Goals and CMS Choice CMS Medicare.gov Compare Post Acute Care list provided to:: Patient    Discharge Placement                          Discharge Plan and Services Additional resources added to the After Visit Summary for                                       Social Determinants of Health (SDOH) Interventions SDOH Screenings   Food Insecurity: Food Insecurity Present (06/24/2022)  Housing: Low Risk  (06/24/2022)  Transportation Needs: No Transportation Needs (06/24/2022)  Utilities: Not At Risk (06/24/2022)  Tobacco Use: Medium Risk (06/27/2022)     Readmission Risk Interventions     No data to display

## 2022-07-11 NOTE — TOC Progression Note (Signed)
Transition of Care (TOC) - Progression Note    Patient Details  Name: [name]. MRN: [id number] Date of Birth: 01-Apr-1956  Transition of Care [facility]) CM/SW Contact  Janae Bridgeman, RN Phone Number: 07/11/2022, 9:49 AM  Clinical Narrative:    CM spoke with Delfina Redwood, RNCM with [facility] Inpatient hosp[facility] facility and the RN from the facility plans to assess the patient at the bedside today at 1100 am for possible adm.   Expected Discharge Plan: Skilled Nursing Facility Barriers to Discharge: Continued Medical Work up  Expected Discharge Plan and Services                                               Social Determinants of Health (SDOH) Interventions SDOH Screenings   Food Insecurity: Food Insecurity Present (06/24/2022)  Housing: Low Risk  (06/24/2022)  Transportation Needs: No Transportation Needs (06/24/2022)  Utilities: Not At Risk (06/24/2022)  Tobacco Use: Medium Risk (06/27/2022)    Readmission Risk Interventions     No data to display

## 2022-07-11 NOTE — Discharge Summary (Signed)
Physician Discharge Summary   Patient: [name]. MRN: [id number] DOB: 1956/03/29  Admit date:     06/24/2022  Discharge date: 07/11/22  Discharge Physician: Alberteen Sam   PCP: Pearson Grippe, MD     Recommendations at discharge:  Discharged to residential Hospice     Discharge Diagnoses: Principal Problem:   Staphylococcus lugdunensi aortic valve abscess Active Problems:   Endocarditis   Acute respiratory failure with hypoxia and hypercapnia   Pleural effusion   Acute on chronic systolic CHF (congestive heart failure)   Pneumonia of right lower lobe due to infectious organism   Obesity hypoventilation syndrome   Hyperlipidemia   Peripheral vascular disease (HCC)   DM (diabetes mellitus), type 2 with peripheral vascular complications   ESRD (end stage renal disease)   Morbid obesity   Hyponatremia   Thrombocytopenia   Protein-calorie malnutrition, moderate      Hospital Course: [name] is a 67 y.o. M with ESRD on HD MWF, DM, and CoNS bacteremia complicated by unremovable AICD and possible aortic root abscess also not a candidate for surgical excision who presented from HD due to hypotension.  Despite dialysis and substantial net negative fluid removal with serial dialysis, the patient's respiratory failure did not improve and his mentation worsened.    Family discussed goals of care with the CCM medical team an the decision was made to halt dialysis and allow a natural course.              Consultants: Critical care Nephrology Palliative care    Disposition: Hospice care   DISCHARGE MEDICATION: Allergies as of 07/11/2022       Reactions   Contrast Media [iodinated Contrast Media] Hives, Other (See Comments)   Happened "in the 80's"   Other Other (See Comments)   "Transfer Dye" = "Makes me tired"   Acetazolamide Anxiety, Other (See Comments)   Jittery, odd feeling (hyper feeling)        Medication List     STOP taking these  medications    amLODipine 5 MG tablet Commonly known as: NORVASC   aspirin 81 MG tablet   brimonidine 0.2 % ophthalmic solution Commonly known as: ALPHAGAN   calcium acetate 667 MG capsule Commonly known as: PHOSLO   carvedilol 3.125 MG tablet Commonly known as: COREG   ceFAZolin  IVPB Commonly known as: ANCEF   cloNIDine 0.1 MG tablet Commonly known as: CATAPRES   clopidogrel 75 MG tablet Commonly known as: PLAVIX   cyanocobalamin 1000 MCG tablet   docusate sodium 100 MG capsule Commonly known as: COLACE   ferrous sulfate 325 (65 FE) MG tablet   fluticasone 50 MCG/ACT nasal spray Commonly known as: FLONASE   folic acid 1 MG tablet Commonly known as: FOLVITE   gabapentin 300 MG capsule Commonly known as: NEURONTIN   HYDROcodone-acetaminophen 5-325 MG tablet Commonly known as: NORCO/VICODIN   icosapent Ethyl 1 g capsule Commonly known as: VASCEPA   insulin aspart 100 UNIT/ML FlexPen Commonly known as: NOVOLOG   insulin glargine-yfgn 100 UNIT/ML injection Commonly known as: SEMGLEE   methocarbamol 500 MG tablet Commonly known as: ROBAXIN   omeprazole 20 MG capsule Commonly known as: PRILOSEC   pantoprazole 40 MG tablet Commonly known as: PROTONIX   rifampin 300 MG capsule Commonly known as: Rifadin   rosuvastatin 20 MG tablet Commonly known as: CRESTOR       TAKE these medications    acetaminophen 325 MG tablet Commonly known as: TYLENOL Take 2  tablets (650 mg total) by mouth every 6 (six) hours.   albuterol (2.5 MG/3ML) 0.083% nebulizer solution Commonly known as: PROVENTIL Inhale 3 mLs (2.5 mg total) into the lungs every 2 (two) hours as needed for wheezing or shortness of breath.   bisacodyl 10 MG suppository Commonly known as: DULCOLAX Place 1 suppository (10 mg total) rectally daily as needed for moderate constipation.   camphor-menthol lotion Commonly known as: SARNA Apply topically as needed for itching.   HYDROmorphone 1  MG/ML injection Commonly known as: DILAUDID Inject 1 mL (1 mg total) into the vein every 2 (two) hours as needed for moderate pain (dyspnea).   LORazepam 2 MG/ML injection Commonly known as: ATIVAN Inject 0.5 mLs (1 mg total) into the vein every 4 (four) hours as needed for anxiety (agitation).   ondansetron 4 MG tablet Commonly known as: ZOFRAN Take 1 tablet (4 mg total) by mouth every 6 (six) hours as needed for nausea.   sodium chloride 0.65 % Soln nasal spray Commonly known as: OCEAN Place 1 spray into both nostrils as needed for congestion.         Discharge Instructions     No wound care   Complete by: As directed        Discharge Exam: Filed Weights   07/05/22 0307 07/06/22 0117 07/07/22 0100  Weight: 82.3 kg 101.1 kg 101.1 kg    General: Pt is somnolent, sitting up, arousable, but sluggish Cardiovascular: Tachycardic, regular, actually do not appreciate murmurs, no lower extremities. Respiratory: Torrey rate somewhat increased, rales bilaterally. Abdominal: Abdomen soft and non-tender.  No distension or HSM.   Neuro/Psych: Somnolent, disoriented   Condition at discharge: worsening  The results of significant diagnostics from this hospitalization (including imaging, microbiology, ancillary and laboratory) are listed below for reference.   Imaging Studies: DG CHEST PORT 1 VIEW  Result Date: 07/05/2022 CLINICAL DATA:  Pleural effusion EXAM: PORTABLE CHEST 1 VIEW COMPARISON:  Portable exam 0515 hours compared to 06/29/2022 FINDINGS: LEFT subclavian ICD lead projects over RIGHT ventricle. Marked enlargement of cardiac silhouette. RIGHT jugular line tip projects over RIGHT atrium. Bibasilar effusions and atelectasis. Improved pulmonary edema. No pneumothorax. IMPRESSION: Bibasilar pleural effusions and atelectasis. Improved pulmonary edema. Electronically Signed   By: Ulyses Southward M.D.   On: 07/05/2022 08:21   DG Chest Port 1 View  Result Date: 06/29/2022 CLINICAL  DATA:  141880 SOB (shortness of breath) [id number] EXAM: PORTABLE CHEST - 1 VIEW COMPARISON:  the previous day's study FINDINGS: Stable tunneled right IJ hemodialysis catheter and left subclavian AICD. Low lung volumes with some apparent worsening of bilateral diffuse interstitial and airspace infiltrates or edema. Heart size and mediastinal contours are within normal limits. Aortic Atherosclerosis (ICD10-170.0). Blunting of the right lateral costophrenic angle suggesting effusion. Visualized bones unremarkable. IMPRESSION: 1. Worsening bilateral infiltrates or edema. 2. Possible right effusion. Electronically Signed   By: Corlis Leak M.D.   On: 06/29/2022 09:55   DG CHEST PORT 1 VIEW  Result Date: 06/28/2022 CLINICAL DATA:  Pleural effusion.  Shortness of breath. EXAM: PORTABLE CHEST 1 VIEW COMPARISON:  Single-view of the chest 06/25/2022 FINDINGS: Moderate right pleural effusion and basilar airspace disease have increased. Small left pleural effusion basilar atelectasis are unchanged. There is cardiomegaly and interstitial edema. Dialysis catheter and AICD are unchanged. IMPRESSION: 1. Increased moderate right pleural effusion and basilar airspace disease. 2. No change in a small left pleural effusion and basilar atelectasis. 3. Cardiomegaly and interstitial edema. Electronically Signed   By: Maisie Fus  Dalessio M.D.   On: 06/28/2022 08:45   ECHOCARDIOGRAM LIMITED  Result Date: 06/27/2022    ECHOCARDIOGRAM LIMITED REPORT   Patient Name:   Lothar Prehn. Date of Exam: 06/27/2022 Medical Rec #:  [id number]         Height:       51.0 in Accession #:    [id number]        Weight:       253.7 lb Date of Birth:  10/15/1955         BSA:          1.835 m Patient Age:    67 years          BP:           133/48 mmHg Patient Gender: M                 HR:           86 bpm. [facility] Procedure: Limited Echo, Limited Color Doppler, Cardiac Doppler and Intracardiac            Opacification Agent Indications:     CHF-Acute Systolic I50.21  History:        Patient has prior history of Echocardiogram examinations, most                 recent 05/27/2022. Cardiomyopathy and CHF, TIA; Risk                 Factors:Hypertension, Dyslipidemia, Former Smoker and Diabetes.  Sonographer:    Aron Baba Referring Phys: [id number] JESSICA U Benjamine Mola  Sonographer Comments: Technically challenging study due to limited acoustic windows. Image acquisition challenging due to patient body habitus and Image acquisition challenging due to respiratory motion. IMPRESSIONS  1. Limited study.  2. Left ventricular ejection fraction, by estimation, is 30 to 35%. The left ventricle has moderately decreased function. The left ventricle demonstrates regional wall motion abnormalities (see scoring diagram/findings for description). The left ventricular internal cavity size was moderately dilated.  3. No formed LV mural thrombus identified with Definity contrast.  4. Right ventricular systolic function is normal. The right ventricular size is normal. Device lead present.  5. The mitral valve is degenerative. Trivial mitral valve regurgitation.  6. The aortic valve was not well visualized. Aortic valve regurgitation is mild.  7. Unable to estimate CVP. Comparison(s): Prior images reviewed side by side. LVEF has decreased in comparison, now 30-35% range. FINDINGS  Left Ventricle: Left ventricular ejection fraction, by estimation, is 30 to 35%. The left ventricle has moderately decreased function. The left ventricle demonstrates regional wall motion abnormalities. Definity contrast agent was given IV to delineate the left ventricular endocardial borders. The left ventricular internal cavity size was moderately dilated. There is no left ventricular hypertrophy.  LV Wall Scoring: The mid anteroseptal segment, mid inferoseptal segment, mid inferior segment, and basal inferoseptal segment are akinetic. The basal anteroseptal segment, apical septal segment, apical anterior  segment, basal inferior segment, apical inferior segment, and apex are hypokinetic. The anterior wall and entire lateral wall are normal. Right Ventricle: The right ventricular size is normal. No increase in right ventricular wall thickness. Right ventricular systolic function is normal. Pericardium: There is no evidence of pericardial effusion. Mitral Valve: The mitral valve is degenerative in appearance. There is mild thickening of the mitral valve leaflet(s). There is mild calcification of the mitral valve leaflet(s). Mild to moderate mitral annular calcification. Trivial mitral valve regurgitation. Tricuspid Valve: The tricuspid  valve is grossly normal. Tricuspid valve regurgitation is trivial. Aortic Valve: The aortic valve was not well visualized. Aortic valve regurgitation is mild. Aorta: Aortic root could not be assessed. Venous: Unable to estimate CVP. The inferior vena cava was not well visualized. Additional Comments: A device lead is visualized. Spectral Doppler performed. Color Doppler performed.  LEFT VENTRICLE PLAX 2D LVIDd:         6.40 cm LVIDs:         5.30 cm LV PW:         1.00 cm LV IVS:        0.90 cm  MITRAL VALVE                TRICUSPID VALVE MV Area (PHT): 3.16 cm     TR Peak grad:   23.6 mmHg MV Decel Time: 240 msec     TR Vmax:        243.00 cm/s MV E velocity: 195.00 cm/s MV A velocity: 109.00 cm/s MV E/A ratio:  1.79 Nona DellSamuel Mcdowell MD Electronically signed by Nona DellSamuel Mcdowell MD Signature Date/Time: 06/27/2022/1:49:09 PM    Final    CT Angio Chest Pulmonary Embolism (PE) W or WO Contrast  Result Date: 06/27/2022 CLINICAL DATA:  Shortness of breath EXAM: CT ANGIOGRAPHY CHEST WITH CONTRAST TECHNIQUE: Multidetector CT imaging of the chest was performed using the standard protocol during bolus administration of intravenous contrast. Multiplanar CT image reconstructions and MIPs were obtained to evaluate the vascular anatomy. RADIATION DOSE REDUCTION: This exam was performed according to  the departmental dose-optimization program which includes automated exposure control, adjustment of the mA and/or kV according to patient size and/or use of iterative reconstruction technique. CONTRAST:  75mL OMNIPAQUE IOHEXOL 350 MG/ML SOLN 13 hour premedication prep was administered. COMPARISON:  Chest x-ray from 06/25/2022 FINDINGS: Cardiovascular: Atherosclerotic calcifications of the thoracic aorta are noted. No aneurysmal dilatation or dissection is seen. Heart is mildly enlarged in size. Pacing device is again seen. The pulmonary artery shows a normal branching pattern bilaterally. No filling defect to suggest pulmonary embolism is noted. Mediastinum/Nodes: Thoracic inlet is within normal limits. No hilar or mediastinal adenopathy is noted. The esophagus as visualized is within normal limits. Lungs/Pleura: Bilateral moderate pleural effusions are noted right slightly greater than left. Associated lower lobe consolidation is noted again right greater than left. Mild central vascular congestion is seen. Patchy airspace opacities are noted likely representing edema although inflammatory change cannot be totally excluded. No parenchymal nodules are noted. Upper Abdomen: Visualized upper abdomen is within normal limits. Musculoskeletal: No chest wall abnormality. No acute or significant osseous findings. Review of the MIP images confirms the above findings. IMPRESSION: No evidence of pulmonary emboli. Changes most suggestive of CHF with edema and bilateral effusions. Aortic Atherosclerosis (ICD10-I70.0). Electronically Signed   By: Alcide CleverMark  Lukens M.D.   On: 06/27/2022 00:37   DG CHEST PORT 1 VIEW  Result Date: 06/25/2022 CLINICAL DATA:  Volume overload EXAM: PORTABLE CHEST 1 VIEW COMPARISON:  06/24/2022 FINDINGS: Single frontal view of the chest demonstrates stable right internal jugular dialysis catheter and single lead AICD. Cardiac silhouette is markedly enlarged, but stable. There is persistent vascular  congestion, with bibasilar veiling opacities again noted right greater than left. No pneumothorax. No acute bony abnormalities. IMPRESSION: 1. Stable findings of congestive heart failure. Electronically Signed   By: Sharlet SalinaMichael  Brown M.D.   On: 06/25/2022 15:54   DG Chest Port 1 View  Result Date: 06/24/2022 CLINICAL DATA:  141880 SOB (shortness of  breath) [id number] EXAM: PORTABLE CHEST - 1 VIEW COMPARISON:  05/24/2022 FINDINGS: Cardiac silhouette is prominent. There is pulmonary interstitial prominence with vascular congestion. No focal consolidation. No pneumothorax or. There may be small pleural effusions. Aorta is calcified. There is a left-sided pacer. Right-sided dialysis catheter tip overlies right atrium. IMPRESSION: Findings suggest CHF. Electronically Signed   By: Layla Maw M.D.   On: 06/24/2022 13:26    Microbiology: Results for orders placed or performed during the hospital encounter of 06/24/22  Resp panel by RT-PCR (RSV, Flu A&B, Covid) Anterior Nasal Swab     Status: None   Collection Time: 06/24/22  1:35 PM   Specimen: Anterior Nasal Swab  Result Value Ref Range Status   SARS Coronavirus 2 by RT PCR NEGATIVE NEGATIVE Final    Comment: (NOTE) SARS-CoV-2 target nucleic acids are NOT DETECTED.  The SARS-CoV-2 RNA is generally detectable in upper respiratory specimens during the acute phase of infection. The lowest concentration of SARS-CoV-2 viral copies this assay can detect is 138 copies/mL. A negative result does not preclude SARS-Cov-2 infection and should not be used as the sole basis for treatment or other patient management decisions. A negative result may occur with  improper specimen collection/handling, submission of specimen other than nasopharyngeal swab, presence of viral mutation(s) within the areas targeted by this assay, and inadequate number of viral copies(<138 copies/mL). A negative result must be combined with clinical observations, patient history, and  epidemiological information. The expected result is Negative.  Fact Sheet for Patients:  BloggerCourse.com  Fact Sheet for Healthcare Providers:  SeriousBroker.it  This test is no t yet approved or cleared by the [place] FDA and  has been authorized for detection and/or diagnosis of SARS-CoV-2 by FDA under an Emergency Use Authorization (EUA). This EUA will remain  in effect (meaning this test can be used) for the duration of the COVID-19 declaration under Section 564(b)(1) of the Act, 21 U.S.C.section 360bbb-3(b)(1), unless the authorization is terminated  or revoked sooner.       Influenza A by PCR NEGATIVE NEGATIVE Final   Influenza B by PCR NEGATIVE NEGATIVE Final    Comment: (NOTE) The Xpert Xpress SARS-CoV-2/FLU/RSV plus assay is intended as an aid in the diagnosis of influenza from Nasopharyngeal swab specimens and should not be used as a sole basis for treatment. Nasal washings and aspirates are unacceptable for Xpert Xpress SARS-CoV-2/FLU/RSV testing.  Fact Sheet for Patients: BloggerCourse.com  Fact Sheet for Healthcare Providers: SeriousBroker.it  This test is not yet approved or cleared by the [place] FDA and has been authorized for detection and/or diagnosis of SARS-CoV-2 by FDA under an Emergency Use Authorization (EUA). This EUA will remain in effect (meaning this test can be used) for the duration of the COVID-19 declaration under Section 564(b)(1) of the Act, 21 U.S.C. section 360bbb-3(b)(1), unless the authorization is terminated or revoked.     Resp Syncytial Virus by PCR NEGATIVE NEGATIVE Final    Comment: (NOTE) Fact Sheet for Patients: BloggerCourse.com  Fact Sheet for Healthcare Providers: SeriousBroker.it  This test is not yet approved or cleared by the [place] FDA and has been  authorized for detection and/or diagnosis of SARS-CoV-2 by FDA under an Emergency Use Authorization (EUA). This EUA will remain in effect (meaning this test can be used) for the duration of the COVID-19 declaration under Section 564(b)(1) of the Act, 21 U.S.C. section 360bbb-3(b)(1), unless the authorization is terminated or revoked.  Performed at [facility], [address]  [place], [place] [address]   MRSA Next Gen by PCR, Nasal     Status: None   Collection Time: 06/25/22  5:09 AM   Specimen: Nasal Mucosa; Nasal Swab  Result Value Ref Range Status   MRSA by PCR Next Gen NOT DETECTED NOT DETECTED Final    Comment: (NOTE) The GeneXpert MRSA Assay (FDA approved for NASAL specimens only), is one component of a comprehensive MRSA colonization surveillance program. It is not intended to diagnose MRSA infection nor to guide or monitor treatment for MRSA infections. Test performance is not FDA approved in patients less than 57 years old. Performed at [facility], [address], [place], [place] [address]   Culture, blood (Routine X 2) w Reflex to ID Panel     Status: None   Collection Time: 06/28/22 11:29 AM   Specimen: BLOOD  Result Value Ref Range Status   Specimen Description BLOOD BLOOD RIGHT ARM  Final   Special Requests   Final    BOTTLES DRAWN AEROBIC ONLY Blood Culture adequate volume   Culture   Final    NO GROWTH 5 DAYS Performed at [facility], [address], [place], [place] [address]    Report Status 07/03/2022 FINAL  Final  Culture, blood (Routine X 2) w Reflex to ID Panel     Status: None   Collection Time: 06/28/22 11:43 AM   Specimen: BLOOD  Result Value Ref Range Status   Specimen Description BLOOD BLOOD RIGHT ARM  Final   Special Requests   Final    BOTTLES DRAWN AEROBIC ONLY Blood Culture adequate volume   Culture   Final    NO GROWTH 5 DAYS Performed at [facility], [address] [place], [place] [address]    Report Status 07/03/2022 FINAL  Final   MRSA Next Gen by PCR, Nasal     Status: None   Collection Time: 06/28/22  6:44 PM   Specimen: Nasal Mucosa; Nasal Swab  Result Value Ref Range Status   MRSA by PCR Next Gen NOT DETECTED NOT DETECTED Final    Comment: (NOTE) The GeneXpert MRSA Assay (FDA approved for NASAL specimens only), is one component of a comprehensive MRSA colonization surveillance program. It is not intended to diagnose MRSA infection nor to guide or monitor treatment for MRSA infections. Test performance is not FDA approved in patients less than 40 years old. Performed at [facility], [address], [place], [place] [address]     Labs: CBC: Recent Labs  Lab 07/05/22 0456 07/06/22 0426  WBC 16.1* 8.6  HGB 9.5* 9.2*  HCT 30.8* 29.2*  MCV 101.0* 100.7*  PLT 168 115*   Basic Metabolic Panel: Recent Labs  Lab 07/05/22 0456 07/06/22 0426  NA 131* 131*  K 3.7 3.8  CL 91* 93*  CO2 23 23  GLUCOSE 166* 100*  BUN 22 26*  CREATININE 4.11* 5.25*  CALCIUM 9.2 9.5  MG 1.8  --    Liver Function Tests: No results for input(s): "AST", "ALT", "ALKPHOS", "BILITOT", "PROT", "ALBUMIN" in the last 168 hours. CBG: Recent Labs  Lab 07/05/22 1525 07/05/22 1915 07/05/22 2136 07/06/22 0726 07/06/22 1123  GLUCAP 123* 137* 119* 83 89    Discharge time spent: approximately 35 minutes spent on discharge counseling, evaluation of patient on day of discharge, and coordination of discharge planning with nursing, social work, pharmacy and case management  Signed: Alberteen Sam, MD [facility] 07/11/2022

## 2022-07-12 ENCOUNTER — Ambulatory Visit: Payer: Medicare Other | Admitting: Internal Medicine

## 2022-07-12 ENCOUNTER — Telehealth: Payer: Self-pay | Admitting: Internal Medicine

## 2022-07-12 ENCOUNTER — Telehealth: Payer: Self-pay

## 2022-07-12 MED ORDER — CEFADROXIL 500 MG PO CAPS: ORAL_CAPSULE | ORAL | 5 refills | Status: AC

## 2022-07-12 NOTE — Telephone Encounter (Signed)
Pt is on my schedule, but prescribing cefadroxil after HD in case he no shows as he needs to be on  suppression.

## 2022-07-12 NOTE — Progress Notes (Deleted)
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   Thrombocytopenia 07/08/2022   Protein-calorie malnutrition, moderate 07/08/2022   Endocarditis 07/08/2022   Acute on chronic systolic CHF (congestive heart failure) 07/04/2022   Goals of care, counseling/discussion 07/04/2022   Pleural effusion 06/30/2022   Pneumonia of right lower lobe due to infectious organism 06/30/2022   Acute respiratory failure with hypoxia and hypercapnia 06/28/2022   Advanced care planning/counseling discussion 06/28/2022   Morbid obesity 05/28/2022   Bacteremia 05/24/2022   Kidney lesion, native, left 05/24/2022   Gynecomastia 05/24/2022   Complication associated with dialysis catheter 05/25/2021   COVID-19 virus infection 05/25/2021   ESRD (end stage renal disease) on dialysis 02/18/2021   Anemia 09/15/2016   Abscess 09/09/2016   Abscess of buttock, right 09/08/2016   ESRD (end stage renal disease) 09/08/2016   S/P bilateral BKA (below knee amputation) 09/08/2016   Anemia due to chronic kidney disease 09/08/2016   Chronic combined systolic (congestive) and diastolic (congestive) heart failure (HCC) 06/20/2016   Acute hypoxemic respiratory failure 06/19/2016   AKI (acute kidney injury) 06/19/2016   DM (diabetes mellitus), type 2 with peripheral vascular complications 06/19/2016   SOB (shortness of breath)    ACHILLES BURSITIS OR TENDINITIS 09/14/2009   PLANTAR FACIITIS 09/14/2009   TIA 09/07/2009   Peripheral vascular disease (HCC) 01/07/2009   Hyperlipidemia 08/27/2008   Essential hypertension 08/27/2008    Patient's Medications  New Prescriptions   No medications on file  Previous Medications   ACETAMINOPHEN (TYLENOL) 325 MG TABLET    Take 2 tablets (650 mg total) by mouth every 6 (six) hours.   ALBUTEROL  (PROVENTIL) (2.5 MG/3ML) 0.083% NEBULIZER SOLUTION    Inhale 3 mLs (2.5 mg total) into the lungs every 2 (two) hours as needed for wheezing or shortness of breath.   BISACODYL (DULCOLAX) 10 MG SUPPOSITORY    Place 1 suppository (10 mg total) rectally daily as needed for moderate constipation.   CAMPHOR-MENTHOL (SARNA) LOTION    Apply topically as needed for itching.   HYDROMORPHONE (DILAUDID) 1 MG/ML INJECTION    Inject 1 mL (1 mg total) into the vein every 2 (two) hours as needed for moderate pain (dyspnea).   LORAZEPAM (ATIVAN) 2 MG/ML INJECTION    Inject 0.5 mLs (1 mg total) into the vein every 4 (four) hours as needed for anxiety (agitation).   ONDANSETRON (ZOFRAN) 4 MG TABLET    Take 1 tablet (4 mg total) by mouth every 6 (six) hours as needed for nausea.   SODIUM CHLORIDE (OCEAN) 0.65 % SOLN NASAL SPRAY    Place 1 spray into both nostrils as needed for congestion.  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  Blood cultures at OSH grew staph lugdunensis, 2/20 blood cultures no growth.  He was transferred from [facility] to [facility] and underwent a TEE which showed thickening of aortic root concerning for aortic root abscess, perforation left coronary cusp aortic valve leaflet, lrge vegetation measuring 1.5cm x1.5cm on  both ICD lead and septal leaflet, 1.3cm  x 0.4cm right ICD lead vegetation. CTS consulted and noted pt did not want surgical intervention, recommend lead removal. Noted he would be high risk of surgery. EP noted pt not a candidate for lead extraction , recommended palliate consult.  Seen by ID plan [facility] EOT 07/08/2022.  We added  rifampin 300 mg p.o. twice daily for 6 weeks there is no plan for lead removal of to avoid any biofilm formation. Today 06/21/2022 presents for hospital follow-up.  We do not have abx  labs. Tolerating abx.   Review of Systems: ROS  Past Medical History:  Diagnosis Date   AICD (automatic cardioverter/defibrillator) present    Anemia    Arthritis    Cerebrovascular disease    CHF (congestive heart failure)    Chronic kidney disease    ESRD, MWF HD   COVID 2022   moderate   Diabetes mellitus    type II   History of TIAs    Hyperlipidemia    Hypertension    Nonischemic cardiomyopathy    PVD (peripheral vascular disease)    s/p B BKA   Shortness of breath dyspnea    with exertion    Skin disease    Rare   Stroke    "light stroke"   Tobacco abuse     Social History   Tobacco Use   Smoking status: Former    Packs/day: 1.00    Years: 20.00    Additional pack years: 0.00    Total pack years: 20.00    Types: Cigarettes    Quit date: 03/12/1998    Years since quitting: 24.3    Passive exposure: Never   Smokeless tobacco: Never  Vaping Use   Vaping Use: Never used  Substance Use Topics   Alcohol use: Not Currently    Comment: occasional   Drug use: No    Family History  Problem Relation Age of Onset   Diabetes Father    Stroke Father    Diabetes Brother    Diabetes Brother    Diabetes Brother    Diabetes Brother    Heart failure Mother    Diabetes Mother    Diabetes Other    Coronary artery disease Other     Allergies  Allergen Reactions   Contrast Media [Iodinated Contrast Media] Hives and Other (See Comments)    Happened "in the 80's"   Other Other (See Comments)    "Transfer Dye" = "Makes me tired"   Acetazolamide Anxiety and Other (See Comments)    Jittery, odd feeling (hyper feeling)    Health Maintenance  Topic Date Due   COVID-19 Vaccine (1) Never done   OPHTHALMOLOGY EXAM  Never done   DTaP/Tdap/Td (1 - Tdap) Never done   COLONOSCOPY (Pts  45-6564yrs Insurance coverage will need to be confirmed)  Never done   Zoster Vaccines- Shingrix (1 of 2) Never done   Pneumonia Vaccine 4965+ Years old (2 of 2 - PCV) 06/20/2017   INFLUENZA VACCINE  11/03/2022   HEMOGLOBIN A1C  11/23/2022   Hepatitis C Screening  Completed   HPV VACCINES  Aged Out    Objective:  There were no vitals filed for this visit. There is no height or weight on file to calculate BMI.  Physical Exam  Lab Results Lab Results  Component Value Date   WBC 8.6 07/06/2022   HGB 9.2 (L) 07/06/2022   HCT 29.2 (L) 07/06/2022   MCV 100.7 (  H) 07/06/2022   PLT 115 (L) 07/06/2022    Lab Results  Component Value Date   CREATININE 5.25 (H) 07/06/2022   BUN 26 (H) 07/06/2022   NA 131 (L) 07/06/2022   K 3.8 07/06/2022   CL 93 (L) 07/06/2022   CO2 23 07/06/2022    Lab Results  Component Value Date   ALT 6 06/29/2022   AST 28 06/29/2022   ALKPHOS 81 06/29/2022   BILITOT 0.8 06/29/2022    Lab Results  Component Value Date   CHOL 89 07/01/2022   HDL 30 (L) 07/01/2022   LDLCALC 25 07/01/2022   TRIG 168 (H) 07/01/2022   CHOLHDL 3.0 07/01/2022   No results found for: "LABRPR", "RPRTITER" No results found for: "HIV1RNAQUANT", "HIV1RNAVL", "CD4TABS"   Problem List Items Addressed This Visit   None  Assessment/Plan #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 - CTS and EP were consulted during admission and no plans for intervention.  We added rifampin 300 mg p.o. twice daily x 6 weeks as lead is going to stay in place. Plan: - Continue cefazolin and  rifampin EOT 07/08/2022 then transiton to cefadroxil 500mg  tiw after HD - Will contact dialysis center for labs weekly(cbc, cmp, esr and crp_ - Follow-up at end of therapy     #Medication monitoring -[facility] 06/09/22 labs: wbc 11.2k, LFT nl   Danelle Earthly, MD [facility] 07/12/2022, 5:54 AM

## 2022-07-12 NOTE — Telephone Encounter (Signed)
Spoke to [name] at [facility] facility [facility]) at [phone]. Patient is comfort care only as he stopped dialysis and will not need oral abx cefroxil advised Dr Thedore Mins.

## 2022-08-03 DEATH — deceased
